# Patient Record
Sex: Female | Born: 1935 | Race: Black or African American | Hispanic: No | Marital: Single | Smoking: Never smoker
Health system: Southern US, Community
[De-identification: ages and names within clinical notes are randomized; demographics above are authoritative.]

## PROBLEM LIST (undated history)

## (undated) DIAGNOSIS — R32 Unspecified urinary incontinence: Secondary | ICD-10-CM

## (undated) DIAGNOSIS — G43909 Migraine, unspecified, not intractable, without status migrainosus: Secondary | ICD-10-CM

## (undated) DIAGNOSIS — M79609 Pain in unspecified limb: Secondary | ICD-10-CM

## (undated) DIAGNOSIS — B3781 Candidal esophagitis: Secondary | ICD-10-CM

## (undated) DIAGNOSIS — T884XXA Failed or difficult intubation, initial encounter: Secondary | ICD-10-CM

## (undated) DIAGNOSIS — K5900 Constipation, unspecified: Secondary | ICD-10-CM

## (undated) DIAGNOSIS — E039 Hypothyroidism, unspecified: Secondary | ICD-10-CM

## (undated) DIAGNOSIS — F32A Depression, unspecified: Secondary | ICD-10-CM

## (undated) DIAGNOSIS — N183 Chronic kidney disease, stage 3 unspecified: Secondary | ICD-10-CM

## (undated) DIAGNOSIS — E559 Vitamin D deficiency, unspecified: Secondary | ICD-10-CM

## (undated) DIAGNOSIS — I110 Hypertensive heart disease with heart failure: Secondary | ICD-10-CM

## (undated) DIAGNOSIS — R0600 Dyspnea, unspecified: Secondary | ICD-10-CM

## (undated) DIAGNOSIS — I10 Essential (primary) hypertension: Secondary | ICD-10-CM

## (undated) DIAGNOSIS — H25019 Cortical age-related cataract, unspecified eye: Secondary | ICD-10-CM

## (undated) DIAGNOSIS — L84 Corns and callosities: Secondary | ICD-10-CM

## (undated) DIAGNOSIS — L97509 Non-pressure chronic ulcer of other part of unspecified foot with unspecified severity: Secondary | ICD-10-CM

## (undated) DIAGNOSIS — Z9181 History of falling: Secondary | ICD-10-CM

## (undated) DIAGNOSIS — E079 Disorder of thyroid, unspecified: Secondary | ICD-10-CM

## (undated) DIAGNOSIS — G8190 Hemiplegia, unspecified affecting unspecified side: Secondary | ICD-10-CM

## (undated) DIAGNOSIS — I639 Cerebral infarction, unspecified: Secondary | ICD-10-CM

## (undated) DIAGNOSIS — D518 Other vitamin B12 deficiency anemias: Secondary | ICD-10-CM

## (undated) DIAGNOSIS — I739 Peripheral vascular disease, unspecified: Secondary | ICD-10-CM

## (undated) DIAGNOSIS — E785 Hyperlipidemia, unspecified: Secondary | ICD-10-CM

## (undated) DIAGNOSIS — R739 Hyperglycemia, unspecified: Secondary | ICD-10-CM

## (undated) DIAGNOSIS — R609 Edema, unspecified: Secondary | ICD-10-CM

## (undated) DIAGNOSIS — J439 Emphysema, unspecified: Secondary | ICD-10-CM

## (undated) DIAGNOSIS — I509 Heart failure, unspecified: Secondary | ICD-10-CM

## (undated) DIAGNOSIS — I7091 Generalized atherosclerosis: Secondary | ICD-10-CM

## (undated) DIAGNOSIS — M109 Gout, unspecified: Secondary | ICD-10-CM

## (undated) DIAGNOSIS — R7309 Other abnormal glucose: Secondary | ICD-10-CM

## (undated) DIAGNOSIS — G894 Chronic pain syndrome: Secondary | ICD-10-CM

## (undated) DIAGNOSIS — R1310 Dysphagia, unspecified: Secondary | ICD-10-CM

## (undated) DIAGNOSIS — F419 Anxiety disorder, unspecified: Secondary | ICD-10-CM

## (undated) DIAGNOSIS — E119 Type 2 diabetes mellitus without complications: Secondary | ICD-10-CM

## (undated) DIAGNOSIS — R5381 Other malaise: Secondary | ICD-10-CM

## (undated) DIAGNOSIS — F329 Major depressive disorder, single episode, unspecified: Secondary | ICD-10-CM

## (undated) DIAGNOSIS — K219 Gastro-esophageal reflux disease without esophagitis: Secondary | ICD-10-CM

## (undated) DIAGNOSIS — B351 Tinea unguium: Secondary | ICD-10-CM

## (undated) DIAGNOSIS — K8021 Calculus of gallbladder without cholecystitis with obstruction: Secondary | ICD-10-CM

## (undated) DIAGNOSIS — C801 Malignant (primary) neoplasm, unspecified: Secondary | ICD-10-CM

## (undated) DIAGNOSIS — E876 Hypokalemia: Secondary | ICD-10-CM

## (undated) DIAGNOSIS — M1990 Unspecified osteoarthritis, unspecified site: Secondary | ICD-10-CM

## (undated) DIAGNOSIS — G609 Hereditary and idiopathic neuropathy, unspecified: Secondary | ICD-10-CM

## (undated) DIAGNOSIS — H409 Unspecified glaucoma: Secondary | ICD-10-CM

## (undated) DIAGNOSIS — G8929 Other chronic pain: Secondary | ICD-10-CM

## (undated) DIAGNOSIS — J45909 Unspecified asthma, uncomplicated: Secondary | ICD-10-CM

## (undated) DIAGNOSIS — E162 Hypoglycemia, unspecified: Secondary | ICD-10-CM

## (undated) HISTORY — DX: Calculus of gallbladder without cholecystitis with obstruction: K80.21

## (undated) HISTORY — DX: Major depressive disorder, single episode, unspecified: F32.9

## (undated) HISTORY — DX: Chronic kidney disease, stage 3 unspecified: N18.30

## (undated) HISTORY — DX: Chronic kidney disease, stage 3 (moderate): N18.3

## (undated) HISTORY — DX: Essential (primary) hypertension: I10

## (undated) HISTORY — PX: ABDOMINAL HYSTERECTOMY: SHX81

## (undated) HISTORY — DX: Heart failure, unspecified: I50.9

## (undated) HISTORY — DX: Gout, unspecified: M10.9

## (undated) HISTORY — DX: Hypertensive heart disease with heart failure: I50.9

## (undated) HISTORY — PX: OTHER SURGICAL HISTORY: SHX169

## (undated) HISTORY — PX: EYE SURGERY: SHX253

## (undated) HISTORY — DX: Hypoglycemia, unspecified: E16.2

## (undated) HISTORY — DX: Constipation, unspecified: K59.00

## (undated) HISTORY — DX: Corns and callosities: L84

## (undated) HISTORY — DX: Candidal esophagitis: B37.81

## (undated) HISTORY — DX: Depression, unspecified: F32.A

## (undated) HISTORY — DX: Hypertensive heart disease with heart failure: I11.0

## (undated) HISTORY — PX: CATARACT EXTRACTION: SUR2

## (undated) HISTORY — DX: Hypokalemia: E87.6

## (undated) HISTORY — DX: Vitamin D deficiency, unspecified: E55.9

## (undated) HISTORY — PX: COLONOSCOPY: SHX174

## (undated) HISTORY — DX: Chronic pain syndrome: G89.4

## (undated) HISTORY — DX: History of falling: Z91.81

## (undated) HISTORY — DX: Unspecified asthma, uncomplicated: J45.909

## (undated) HISTORY — PX: BACK SURGERY: SHX140

## (undated) HISTORY — DX: Gastro-esophageal reflux disease without esophagitis: K21.9

## (undated) HISTORY — DX: Migraine, unspecified, not intractable, without status migrainosus: G43.909

## (undated) HISTORY — DX: Other vitamin B12 deficiency anemias: D51.8

---

## 2004-07-19 ENCOUNTER — Ambulatory Visit: Payer: Self-pay | Admitting: Internal Medicine

## 2005-08-25 ENCOUNTER — Ambulatory Visit: Payer: Self-pay | Admitting: Internal Medicine

## 2006-03-25 ENCOUNTER — Ambulatory Visit: Payer: Self-pay | Admitting: Rheumatology

## 2006-04-09 ENCOUNTER — Ambulatory Visit: Payer: Self-pay | Admitting: Rheumatology

## 2006-05-04 ENCOUNTER — Encounter: Payer: Self-pay | Admitting: Rheumatology

## 2006-05-18 ENCOUNTER — Encounter: Payer: Self-pay | Admitting: Rheumatology

## 2006-09-07 ENCOUNTER — Ambulatory Visit: Payer: Self-pay | Admitting: Internal Medicine

## 2007-06-27 ENCOUNTER — Other Ambulatory Visit: Payer: Self-pay

## 2007-06-27 ENCOUNTER — Emergency Department: Payer: Self-pay | Admitting: Emergency Medicine

## 2007-07-19 ENCOUNTER — Ambulatory Visit: Payer: Self-pay | Admitting: Oncology

## 2007-07-28 ENCOUNTER — Ambulatory Visit: Payer: Self-pay | Admitting: Internal Medicine

## 2007-08-04 ENCOUNTER — Ambulatory Visit: Payer: Self-pay | Admitting: Internal Medicine

## 2007-08-06 ENCOUNTER — Ambulatory Visit: Payer: Self-pay | Admitting: Oncology

## 2007-08-19 ENCOUNTER — Ambulatory Visit: Payer: Self-pay | Admitting: Oncology

## 2007-08-31 ENCOUNTER — Ambulatory Visit: Payer: Self-pay | Admitting: Urology

## 2007-08-31 ENCOUNTER — Other Ambulatory Visit: Payer: Self-pay

## 2007-09-14 ENCOUNTER — Ambulatory Visit: Payer: Self-pay | Admitting: Urology

## 2007-09-19 ENCOUNTER — Ambulatory Visit: Payer: Self-pay | Admitting: Oncology

## 2007-10-11 ENCOUNTER — Ambulatory Visit: Payer: Self-pay | Admitting: Internal Medicine

## 2007-10-17 ENCOUNTER — Ambulatory Visit: Payer: Self-pay | Admitting: Oncology

## 2007-10-29 ENCOUNTER — Ambulatory Visit: Payer: Self-pay | Admitting: Urology

## 2007-11-12 ENCOUNTER — Ambulatory Visit: Payer: Self-pay | Admitting: Vascular Surgery

## 2007-12-17 ENCOUNTER — Ambulatory Visit: Payer: Self-pay | Admitting: Oncology

## 2008-01-17 ENCOUNTER — Ambulatory Visit: Payer: Self-pay | Admitting: Oncology

## 2008-01-25 ENCOUNTER — Ambulatory Visit: Payer: Self-pay | Admitting: Oncology

## 2008-02-02 ENCOUNTER — Ambulatory Visit: Payer: Self-pay | Admitting: Urology

## 2008-02-16 ENCOUNTER — Ambulatory Visit: Payer: Self-pay | Admitting: Oncology

## 2008-04-18 ENCOUNTER — Ambulatory Visit: Payer: Self-pay | Admitting: Oncology

## 2008-05-12 ENCOUNTER — Ambulatory Visit: Payer: Self-pay | Admitting: Urology

## 2008-10-16 ENCOUNTER — Ambulatory Visit: Payer: Self-pay | Admitting: Urology

## 2009-03-15 ENCOUNTER — Ambulatory Visit: Payer: Self-pay | Admitting: Vascular Surgery

## 2009-04-29 ENCOUNTER — Emergency Department: Payer: Self-pay | Admitting: Internal Medicine

## 2009-05-07 ENCOUNTER — Ambulatory Visit: Payer: Self-pay | Admitting: Urology

## 2009-07-04 ENCOUNTER — Ambulatory Visit: Payer: Self-pay | Admitting: Ophthalmology

## 2009-08-23 ENCOUNTER — Ambulatory Visit: Payer: Self-pay | Admitting: Vascular Surgery

## 2009-10-25 ENCOUNTER — Ambulatory Visit: Payer: Self-pay | Admitting: Rheumatology

## 2009-10-25 ENCOUNTER — Ambulatory Visit: Payer: Self-pay | Admitting: Internal Medicine

## 2009-11-14 ENCOUNTER — Ambulatory Visit: Payer: Self-pay | Admitting: Gastroenterology

## 2009-11-21 ENCOUNTER — Ambulatory Visit: Payer: Self-pay | Admitting: Urology

## 2009-11-26 ENCOUNTER — Ambulatory Visit: Payer: Self-pay | Admitting: Gastroenterology

## 2009-12-10 ENCOUNTER — Ambulatory Visit: Payer: Self-pay | Admitting: Family Medicine

## 2009-12-12 ENCOUNTER — Ambulatory Visit: Payer: Self-pay | Admitting: Internal Medicine

## 2009-12-19 ENCOUNTER — Ambulatory Visit: Payer: Self-pay | Admitting: Ophthalmology

## 2010-01-09 ENCOUNTER — Ambulatory Visit: Payer: Self-pay | Admitting: Ophthalmology

## 2010-03-07 ENCOUNTER — Ambulatory Visit: Payer: Self-pay | Admitting: Gastroenterology

## 2010-03-11 ENCOUNTER — Ambulatory Visit: Payer: Self-pay | Admitting: Pain Medicine

## 2010-03-12 ENCOUNTER — Ambulatory Visit: Payer: Self-pay | Admitting: Pain Medicine

## 2010-03-25 ENCOUNTER — Ambulatory Visit: Payer: Self-pay | Admitting: Pain Medicine

## 2010-04-09 ENCOUNTER — Ambulatory Visit: Payer: Self-pay | Admitting: Pain Medicine

## 2010-04-23 ENCOUNTER — Ambulatory Visit: Payer: Self-pay | Admitting: Pain Medicine

## 2010-05-06 ENCOUNTER — Ambulatory Visit: Payer: Self-pay | Admitting: Pain Medicine

## 2010-06-05 ENCOUNTER — Ambulatory Visit: Payer: Self-pay | Admitting: Urology

## 2010-09-30 ENCOUNTER — Ambulatory Visit: Payer: Self-pay | Admitting: Unknown Physician Specialty

## 2010-10-14 ENCOUNTER — Ambulatory Visit: Payer: Self-pay | Admitting: Unknown Physician Specialty

## 2010-10-17 ENCOUNTER — Inpatient Hospital Stay: Payer: Self-pay | Admitting: Unknown Physician Specialty

## 2010-11-11 ENCOUNTER — Ambulatory Visit: Payer: Self-pay | Admitting: Unknown Physician Specialty

## 2011-01-30 ENCOUNTER — Emergency Department: Payer: Self-pay | Admitting: Emergency Medicine

## 2011-03-28 ENCOUNTER — Other Ambulatory Visit: Payer: Self-pay

## 2011-04-08 ENCOUNTER — Ambulatory Visit: Payer: Self-pay | Admitting: Gastroenterology

## 2011-04-14 ENCOUNTER — Observation Stay: Payer: Self-pay | Admitting: Internal Medicine

## 2011-05-21 ENCOUNTER — Ambulatory Visit: Payer: Self-pay | Admitting: Gastroenterology

## 2011-08-09 ENCOUNTER — Observation Stay: Payer: Self-pay | Admitting: Specialist

## 2011-08-17 ENCOUNTER — Inpatient Hospital Stay: Payer: Self-pay | Admitting: *Deleted

## 2011-08-20 LAB — BASIC METABOLIC PANEL
Anion Gap: 14 (ref 7–16)
BUN: 3 mg/dL — ABNORMAL LOW (ref 7–18)
Chloride: 110 mmol/L — ABNORMAL HIGH (ref 98–107)
Creatinine: 0.99 mg/dL (ref 0.60–1.30)
EGFR (Non-African Amer.): 57 — ABNORMAL LOW
Glucose: 97 mg/dL (ref 65–99)
Osmolality: 280 (ref 275–301)
Sodium: 142 mmol/L (ref 136–145)

## 2011-08-20 LAB — CBC WITH DIFFERENTIAL/PLATELET
Basophil %: 0.3 %
Eosinophil #: 0.1 10*3/uL (ref 0.0–0.7)
Eosinophil %: 1.9 %
HGB: 12.3 g/dL (ref 12.0–16.0)
Lymphocyte #: 1.9 10*3/uL (ref 1.0–3.6)
MCH: 32.4 pg (ref 26.0–34.0)
MCHC: 32.9 g/dL (ref 32.0–36.0)
MCV: 99 fL (ref 80–100)
Monocyte #: 0.4 10*3/uL (ref 0.0–0.7)
Platelet: 180 10*3/uL (ref 150–440)
RBC: 3.79 10*6/uL — ABNORMAL LOW (ref 3.80–5.20)

## 2011-08-21 LAB — BASIC METABOLIC PANEL
Anion Gap: 10 (ref 7–16)
BUN: 1 mg/dL — ABNORMAL LOW (ref 7–18)
Chloride: 113 mmol/L — ABNORMAL HIGH (ref 98–107)
Creatinine: 1.04 mg/dL (ref 0.60–1.30)
Glucose: 90 mg/dL (ref 65–99)
Osmolality: 282 (ref 275–301)
Potassium: 3.7 mmol/L (ref 3.5–5.1)

## 2011-08-21 LAB — CBC WITH DIFFERENTIAL/PLATELET
Basophil #: 0 10*3/uL (ref 0.0–0.1)
Basophil %: 0.3 %
Eosinophil #: 0.1 10*3/uL (ref 0.0–0.7)
Eosinophil %: 1.5 %
HCT: 32.8 % — ABNORMAL LOW (ref 35.0–47.0)
HGB: 10.9 g/dL — ABNORMAL LOW (ref 12.0–16.0)
Lymphocyte %: 37.4 %
MCHC: 33.1 g/dL (ref 32.0–36.0)
Monocyte #: 0.5 10*3/uL (ref 0.0–0.7)
Monocyte %: 8.8 %
Neutrophil #: 2.8 10*3/uL (ref 1.4–6.5)
Neutrophil %: 52 %
Platelet: 151 10*3/uL (ref 150–440)
RBC: 3.37 10*6/uL — ABNORMAL LOW (ref 3.80–5.20)
RDW: 15.1 % — ABNORMAL HIGH (ref 11.5–14.5)
WBC: 5.3 10*3/uL (ref 3.6–11.0)

## 2011-08-21 LAB — AFP TUMOR MARKER: AFP-Tumor Marker: 5.4 ng/mL (ref 0.0–8.3)

## 2011-08-22 LAB — CBC WITH DIFFERENTIAL/PLATELET
Eosinophil %: 0.8 %
HGB: 13.1 g/dL (ref 12.0–16.0)
MCV: 100 fL (ref 80–100)
Monocyte %: 10.3 %
Neutrophil %: 65.6 %
Platelet: 160 10*3/uL (ref 150–440)
RBC: 4.02 10*6/uL (ref 3.80–5.20)
WBC: 7.8 10*3/uL (ref 3.6–11.0)

## 2011-08-23 LAB — BASIC METABOLIC PANEL
Anion Gap: 8 (ref 7–16)
BUN: 2 mg/dL — ABNORMAL LOW (ref 7–18)
Calcium, Total: 9 mg/dL (ref 8.5–10.1)
Chloride: 110 mmol/L — ABNORMAL HIGH (ref 98–107)
Co2: 21 mmol/L (ref 21–32)
Creatinine: 0.79 mg/dL (ref 0.60–1.30)
Potassium: 5.4 mmol/L — ABNORMAL HIGH (ref 3.5–5.1)

## 2011-08-23 LAB — POTASSIUM: Potassium: 4.5 mmol/L (ref 3.5–5.1)

## 2011-08-23 LAB — CBC WITH DIFFERENTIAL/PLATELET
Basophil %: 1 %
Eosinophil %: 1.7 %
HCT: 36.2 % (ref 35.0–47.0)
HGB: 12.1 g/dL (ref 12.0–16.0)
Lymphocyte #: 2.1 10*3/uL (ref 1.0–3.6)
Lymphocyte %: 25.4 %
MCH: 32.5 pg (ref 26.0–34.0)
MCV: 98 fL (ref 80–100)
Monocyte #: 0.6 10*3/uL (ref 0.0–0.7)
Monocyte %: 7.8 %
Neutrophil #: 5.2 10*3/uL (ref 1.4–6.5)
Platelet: 202 10*3/uL (ref 150–440)
RBC: 3.71 10*6/uL — ABNORMAL LOW (ref 3.80–5.20)
WBC: 8.2 10*3/uL (ref 3.6–11.0)

## 2011-08-23 LAB — GLUCOSE, RANDOM: Glucose: 81 mg/dL (ref 65–99)

## 2011-08-24 LAB — BASIC METABOLIC PANEL
Anion Gap: 10 (ref 7–16)
BUN: 5 mg/dL — ABNORMAL LOW (ref 7–18)
Chloride: 106 mmol/L (ref 98–107)
Co2: 24 mmol/L (ref 21–32)
Osmolality: 277 (ref 275–301)

## 2011-08-25 LAB — BASIC METABOLIC PANEL
Calcium, Total: 9.1 mg/dL (ref 8.5–10.1)
Chloride: 106 mmol/L (ref 98–107)
EGFR (Non-African Amer.): 56 — ABNORMAL LOW
Glucose: 106 mg/dL — ABNORMAL HIGH (ref 65–99)
Osmolality: 274 (ref 275–301)
Potassium: 3.8 mmol/L (ref 3.5–5.1)
Sodium: 138 mmol/L (ref 136–145)

## 2011-08-25 LAB — GLUCOSE, RANDOM: Glucose: 84 mg/dL (ref 65–99)

## 2011-08-25 LAB — PATHOLOGY REPORT

## 2011-08-26 LAB — BASIC METABOLIC PANEL
Anion Gap: 12 (ref 7–16)
BUN: 9 mg/dL (ref 7–18)
Co2: 21 mmol/L (ref 21–32)
Creatinine: 1.12 mg/dL (ref 0.60–1.30)
EGFR (African American): >60
EGFR (Non-African Amer.): 50 — ABNORMAL LOW
Osmolality: 278 (ref 275–301)
Sodium: 140 mmol/L (ref 136–145)

## 2011-08-26 LAB — MAGNESIUM: Magnesium: 1.8 mg/dL

## 2011-08-27 LAB — BASIC METABOLIC PANEL
Anion Gap: 12 (ref 7–16)
BUN: 13 mg/dL (ref 7–18)
Chloride: 106 mmol/L (ref 98–107)
Co2: 21 mmol/L (ref 21–32)
Creatinine: 1.14 mg/dL (ref 0.60–1.30)
EGFR (African American): 59 — ABNORMAL LOW
EGFR (Non-African Amer.): 49 — ABNORMAL LOW
Osmolality: 277 (ref 275–301)
Potassium: 4.4 mmol/L (ref 3.5–5.1)
Sodium: 139 mmol/L (ref 136–145)

## 2011-08-27 LAB — GLUCOSE, RANDOM: Glucose: 89 mg/dL (ref 65–99)

## 2012-11-22 ENCOUNTER — Ambulatory Visit: Payer: Self-pay | Admitting: Gastroenterology

## 2012-11-22 LAB — KOH PREP

## 2013-01-10 LAB — URINALYSIS, COMPLETE
Blood: NEGATIVE
Hyaline Cast: 3
Nitrite: NEGATIVE
Ph: 9 (ref 4.5–8.0)
Protein: NEGATIVE
Squamous Epithelial: 3

## 2013-01-10 LAB — COMPREHENSIVE METABOLIC PANEL
Albumin: 2.9 g/dL — ABNORMAL LOW (ref 3.4–5.0)
Alkaline Phosphatase: 231 U/L — ABNORMAL HIGH (ref 50–136)
Anion Gap: 12 (ref 7–16)
Bilirubin,Total: 0.4 mg/dL (ref 0.2–1.0)
Creatinine: 0.87 mg/dL (ref 0.60–1.30)
Glucose: 107 mg/dL — ABNORMAL HIGH (ref 65–99)
Osmolality: 276 (ref 275–301)
Potassium: 3.1 mmol/L — ABNORMAL LOW (ref 3.5–5.1)
SGOT(AST): 21 U/L (ref 15–37)
SGPT (ALT): 12 U/L (ref 12–78)

## 2013-01-10 LAB — CBC
HGB: 12.7 g/dL (ref 12.0–16.0)
MCH: 28.4 pg (ref 26.0–34.0)
MCV: 87 fL (ref 80–100)
Platelet: 374 10*3/uL (ref 150–440)
RBC: 4.46 10*6/uL (ref 3.80–5.20)
RDW: 16.9 % — ABNORMAL HIGH (ref 11.5–14.5)

## 2013-01-11 LAB — BASIC METABOLIC PANEL
BUN: 6 mg/dL — ABNORMAL LOW (ref 7–18)
Calcium, Total: 9.1 mg/dL (ref 8.5–10.1)
Chloride: 100 mmol/L (ref 98–107)
Co2: 22 mmol/L (ref 21–32)
Creatinine: 1.11 mg/dL (ref 0.60–1.30)
EGFR (Non-African Amer.): 46 — ABNORMAL LOW
Glucose: 99 mg/dL (ref 65–99)
Osmolality: 264 (ref 275–301)
Potassium: 3.3 mmol/L — ABNORMAL LOW (ref 3.5–5.1)
Sodium: 133 mmol/L — ABNORMAL LOW (ref 136–145)

## 2013-01-11 LAB — CBC WITH DIFFERENTIAL/PLATELET
Basophil #: 0 10*3/uL (ref 0.0–0.1)
Basophil %: 0.3 %
HCT: 39.4 % (ref 35.0–47.0)
Lymphocyte #: 1.4 10*3/uL (ref 1.0–3.6)
MCH: 29 pg (ref 26.0–34.0)
MCHC: 33.5 g/dL (ref 32.0–36.0)
MCV: 87 fL (ref 80–100)
Monocyte %: 5.5 %
Neutrophil #: 11.7 10*3/uL — ABNORMAL HIGH (ref 1.4–6.5)
Neutrophil %: 84.3 %
Platelet: 376 10*3/uL (ref 150–440)
RBC: 4.55 10*6/uL (ref 3.80–5.20)
RDW: 17 % — ABNORMAL HIGH (ref 11.5–14.5)

## 2013-01-11 LAB — LIPASE, BLOOD: Lipase: 129 U/L (ref 73–393)

## 2013-01-11 LAB — PRO B NATRIURETIC PEPTIDE: B-Type Natriuretic Peptide: 4252 pg/mL — ABNORMAL HIGH (ref 0–450)

## 2013-01-12 ENCOUNTER — Inpatient Hospital Stay: Payer: Self-pay | Admitting: Student

## 2013-01-12 LAB — BASIC METABOLIC PANEL
BUN: 8 mg/dL (ref 7–18)
Calcium, Total: 9 mg/dL (ref 8.5–10.1)
Co2: 22 mmol/L (ref 21–32)
EGFR (Non-African Amer.): 51 — ABNORMAL LOW
Glucose: 78 mg/dL (ref 65–99)
Osmolality: 267 (ref 275–301)
Potassium: 3.4 mmol/L — ABNORMAL LOW (ref 3.5–5.1)
Sodium: 135 mmol/L — ABNORMAL LOW (ref 136–145)

## 2013-01-12 LAB — CBC WITH DIFFERENTIAL/PLATELET
Basophil #: 0 10*3/uL (ref 0.0–0.1)
Basophil %: 0.1 %
Eosinophil %: 0.1 %
Lymphocyte #: 2 10*3/uL (ref 1.0–3.6)
Lymphocyte %: 12.8 %
MCH: 28.9 pg (ref 26.0–34.0)
MCHC: 33.4 g/dL (ref 32.0–36.0)
MCV: 87 fL (ref 80–100)
Monocyte #: 1.3 x10 3/mm — ABNORMAL HIGH (ref 0.2–0.9)
Neutrophil #: 12.1 10*3/uL — ABNORMAL HIGH (ref 1.4–6.5)
Neutrophil %: 78.6 %
Platelet: 406 10*3/uL (ref 150–440)
RBC: 5.11 10*6/uL (ref 3.80–5.20)
RDW: 16.9 % — ABNORMAL HIGH (ref 11.5–14.5)

## 2013-01-12 LAB — AFP TUMOR MARKER: AFP-Tumor Marker: 4.2 ng/mL (ref 0.0–8.3)

## 2013-01-12 LAB — MAGNESIUM: Magnesium: 1.8 mg/dL

## 2013-01-14 LAB — CBC WITH DIFFERENTIAL/PLATELET
Basophil #: 0.1 10*3/uL (ref 0.0–0.1)
Basophil %: 0.9 %
Eosinophil #: 0.2 10*3/uL (ref 0.0–0.7)
Eosinophil %: 1 %
HCT: 40.3 % (ref 35.0–47.0)
Lymphocyte #: 4.2 10*3/uL — ABNORMAL HIGH (ref 1.0–3.6)
Lymphocyte %: 27.1 %
MCH: 29.1 pg (ref 26.0–34.0)
MCHC: 33.4 g/dL (ref 32.0–36.0)
MCV: 87 fL (ref 80–100)
Neutrophil #: 9.9 10*3/uL — ABNORMAL HIGH (ref 1.4–6.5)
Neutrophil %: 64.3 %
Platelet: 335 10*3/uL (ref 150–440)
RDW: 17 % — ABNORMAL HIGH (ref 11.5–14.5)

## 2013-01-14 LAB — URINE CULTURE

## 2013-01-16 ENCOUNTER — Ambulatory Visit: Payer: Self-pay | Admitting: Internal Medicine

## 2013-01-16 LAB — STOOL CULTURE

## 2013-01-18 LAB — CULTURE, BLOOD (SINGLE)

## 2013-01-20 LAB — COMPREHENSIVE METABOLIC PANEL
Albumin: 2.5 g/dL — ABNORMAL LOW (ref 3.4–5.0)
Anion Gap: 8 (ref 7–16)
Bilirubin,Total: 0.3 mg/dL (ref 0.2–1.0)
Calcium, Total: 8.5 mg/dL (ref 8.5–10.1)
EGFR (African American): 41 — ABNORMAL LOW
EGFR (Non-African Amer.): 35 — ABNORMAL LOW
Glucose: 315 mg/dL — ABNORMAL HIGH (ref 65–99)
Osmolality: 277 (ref 275–301)
Potassium: 4.3 mmol/L (ref 3.5–5.1)
SGOT(AST): 18 U/L (ref 15–37)
SGPT (ALT): 10 U/L — ABNORMAL LOW (ref 12–78)
Sodium: 132 mmol/L — ABNORMAL LOW (ref 136–145)
Total Protein: 6.3 g/dL — ABNORMAL LOW (ref 6.4–8.2)

## 2013-01-20 LAB — BASIC METABOLIC PANEL
BUN: 13 mg/dL (ref 7–18)
Calcium, Total: 8.8 mg/dL (ref 8.5–10.1)
Co2: 20 mmol/L — ABNORMAL LOW (ref 21–32)
Creatinine: 1.5 mg/dL — ABNORMAL HIGH (ref 0.60–1.30)
EGFR (African American): 37 — ABNORMAL LOW
Glucose: 235 mg/dL — ABNORMAL HIGH (ref 65–99)
Osmolality: 276 (ref 275–301)

## 2013-01-20 LAB — URINALYSIS, COMPLETE
Bacteria: NONE SEEN
Bilirubin,UR: NEGATIVE
Blood: NEGATIVE
Glucose,UR: >=500 mg/dL (ref 0–75)
Hyaline Cast: 3
Leukocyte Esterase: NEGATIVE
Nitrite: NEGATIVE
RBC,UR: 3 /HPF (ref 0–5)
Squamous Epithelial: 2

## 2013-01-20 LAB — CBC
MCH: 28.6 pg (ref 26.0–34.0)
RBC: 3.92 10*6/uL (ref 3.80–5.20)
WBC: 15.1 10*3/uL — ABNORMAL HIGH (ref 3.6–11.0)

## 2013-01-20 LAB — HEMOGLOBIN A1C: Hemoglobin A1C: 5.4 % (ref 4.2–6.3)

## 2013-01-21 ENCOUNTER — Inpatient Hospital Stay: Payer: Self-pay | Admitting: Student

## 2013-01-21 LAB — COMPREHENSIVE METABOLIC PANEL
Alkaline Phosphatase: 174 U/L — ABNORMAL HIGH (ref 50–136)
Bilirubin,Total: 0.3 mg/dL (ref 0.2–1.0)
Chloride: 106 mmol/L (ref 98–107)
Co2: 24 mmol/L (ref 21–32)
Creatinine: 1.19 mg/dL (ref 0.60–1.30)
EGFR (African American): 49 — ABNORMAL LOW
EGFR (Non-African Amer.): 42 — ABNORMAL LOW
Glucose: 73 mg/dL (ref 65–99)
Osmolality: 268 (ref 275–301)
SGOT(AST): 17 U/L (ref 15–37)
SGPT (ALT): 9 U/L — ABNORMAL LOW (ref 12–78)
Sodium: 135 mmol/L — ABNORMAL LOW (ref 136–145)
Total Protein: 6.8 g/dL (ref 6.4–8.2)

## 2013-01-21 LAB — CBC WITH DIFFERENTIAL/PLATELET
Basophil #: 0.1 10*3/uL (ref 0.0–0.1)
Basophil %: 0.6 %
Eosinophil %: 2.6 %
Lymphocyte #: 2.7 10*3/uL (ref 1.0–3.6)
Lymphocyte %: 26.4 %
MCH: 28.7 pg (ref 26.0–34.0)
MCHC: 32.5 g/dL (ref 32.0–36.0)
Monocyte #: 1 x10 3/mm — ABNORMAL HIGH (ref 0.2–0.9)
Neutrophil #: 6.2 10*3/uL (ref 1.4–6.5)
Neutrophil %: 60.9 %
Platelet: 272 10*3/uL (ref 150–440)
RBC: 4 10*6/uL (ref 3.80–5.20)
RDW: 17.2 % — ABNORMAL HIGH (ref 11.5–14.5)

## 2013-01-21 LAB — TROPONIN I
Troponin-I: <0.02 ng/mL
Troponin-I: <0.02 ng/mL

## 2013-01-21 LAB — PRO B NATRIURETIC PEPTIDE: B-Type Natriuretic Peptide: 492 pg/mL — ABNORMAL HIGH (ref 0–450)

## 2013-01-23 LAB — URINE CULTURE

## 2013-01-26 LAB — CULTURE, BLOOD (SINGLE)

## 2013-02-15 ENCOUNTER — Ambulatory Visit: Payer: Self-pay | Admitting: Internal Medicine

## 2013-02-27 ENCOUNTER — Emergency Department: Payer: Self-pay | Admitting: Internal Medicine

## 2013-02-27 LAB — CBC
MCH: 29.4 pg (ref 26.0–34.0)
MCHC: 32.2 g/dL (ref 32.0–36.0)
Platelet: 359 10*3/uL (ref 150–440)
RBC: 4.11 10*6/uL (ref 3.80–5.20)

## 2013-02-27 LAB — COMPREHENSIVE METABOLIC PANEL
Albumin: 2.6 g/dL — ABNORMAL LOW (ref 3.4–5.0)
Alkaline Phosphatase: 178 U/L — ABNORMAL HIGH (ref 50–136)
BUN: 13 mg/dL (ref 7–18)
Bilirubin,Total: 0.4 mg/dL (ref 0.2–1.0)
Calcium, Total: 8.9 mg/dL (ref 8.5–10.1)
Co2: 23 mmol/L (ref 21–32)
Creatinine: 1.17 mg/dL (ref 0.60–1.30)
Glucose: 112 mg/dL — ABNORMAL HIGH (ref 65–99)
Osmolality: 271 (ref 275–301)
Potassium: 4 mmol/L (ref 3.5–5.1)
Total Protein: 7.3 g/dL (ref 6.4–8.2)

## 2013-02-27 LAB — URINALYSIS, COMPLETE
Bilirubin,UR: NEGATIVE
Blood: NEGATIVE
Leukocyte Esterase: NEGATIVE
Nitrite: NEGATIVE
Protein: NEGATIVE
RBC,UR: <1 /HPF (ref 0–5)

## 2013-07-01 ENCOUNTER — Ambulatory Visit: Payer: Self-pay | Admitting: Gastroenterology

## 2013-07-15 ENCOUNTER — Ambulatory Visit: Payer: Self-pay | Admitting: Gastroenterology

## 2013-08-03 ENCOUNTER — Encounter: Payer: Self-pay | Admitting: *Deleted

## 2013-08-22 ENCOUNTER — Ambulatory Visit: Payer: Self-pay | Admitting: Podiatry

## 2014-07-17 ENCOUNTER — Ambulatory Visit: Payer: Self-pay

## 2014-07-17 LAB — URINALYSIS, COMPLETE
BLOOD: NEGATIVE
Glucose,UR: NEGATIVE
Ketone: NEGATIVE
Nitrite: NEGATIVE
Ph: 5 (ref 5.0–8.0)
Protein: NEGATIVE
SPECIFIC GRAVITY: 1.015 (ref 1.000–1.030)

## 2014-07-19 LAB — URINE CULTURE

## 2014-07-25 ENCOUNTER — Emergency Department: Payer: Self-pay | Admitting: Emergency Medicine

## 2014-07-25 LAB — CBC WITH DIFFERENTIAL/PLATELET
Basophil #: 0.1 10*3/uL (ref 0.0–0.1)
Basophil %: 0.5 %
EOS ABS: 0.4 10*3/uL (ref 0.0–0.7)
Eosinophil %: 3.1 %
HCT: 42.9 % (ref 35.0–47.0)
HGB: 13.1 g/dL (ref 12.0–16.0)
LYMPHS ABS: 2.2 10*3/uL (ref 1.0–3.6)
Lymphocyte %: 17.7 %
MCH: 29.2 pg (ref 26.0–34.0)
MCHC: 30.6 g/dL — ABNORMAL LOW (ref 32.0–36.0)
MCV: 95 fL (ref 80–100)
MONOS PCT: 7.7 %
Monocyte #: 0.9 x10 3/mm (ref 0.2–0.9)
Neutrophil #: 8.7 10*3/uL — ABNORMAL HIGH (ref 1.4–6.5)
Neutrophil %: 71 %
PLATELETS: 287 10*3/uL (ref 150–440)
RBC: 4.49 10*6/uL (ref 3.80–5.20)
RDW: 16.8 % — AB (ref 11.5–14.5)
WBC: 12.3 10*3/uL — AB (ref 3.6–11.0)

## 2014-07-25 LAB — TROPONIN I: Troponin-I: <0.02 ng/mL

## 2014-07-25 LAB — URINALYSIS, COMPLETE
Bilirubin,UR: NEGATIVE
Blood: NEGATIVE
Glucose,UR: 50 mg/dL (ref 0–75)
Ketone: NEGATIVE
Nitrite: NEGATIVE
PH: 7 (ref 4.5–8.0)
PROTEIN: NEGATIVE
RBC,UR: 6 /HPF (ref 0–5)
Specific Gravity: 1.01 (ref 1.003–1.030)
WBC UR: 55 /HPF (ref 0–5)

## 2014-07-25 LAB — BASIC METABOLIC PANEL
ANION GAP: 8 (ref 7–16)
Anion Gap: 11 (ref 7–16)
BUN: 9 mg/dL (ref 7–18)
BUN: 8 mg/dL (ref 7–18)
Calcium, Total: 9.4 mg/dL (ref 8.5–10.1)
Calcium, Total: 8.7 mg/dL (ref 8.5–10.1)
Chloride: 108 mmol/L — ABNORMAL HIGH (ref 98–107)
Chloride: 103 mmol/L (ref 98–107)
Co2: 21 mmol/L (ref 21–32)
Co2: 24 mmol/L (ref 21–32)
Creatinine: 1.2 mg/dL (ref 0.60–1.30)
Creatinine: 1.24 mg/dL (ref 0.60–1.30)
EGFR (African American): 55 — ABNORMAL LOW
EGFR (African American): 53 — ABNORMAL LOW
GFR CALC NON AF AMER: 45 — AB
GFR CALC NON AF AMER: 44 — AB
Glucose: 74 mg/dL (ref 65–99)
Glucose: 156 mg/dL — ABNORMAL HIGH (ref 65–99)
Osmolality: 271 (ref 275–301)
Osmolality: 277 (ref 275–301)
Potassium: 4.7 mmol/L (ref 3.5–5.1)
Potassium: 3.9 mmol/L (ref 3.5–5.1)
Sodium: 137 mmol/L (ref 136–145)
Sodium: 138 mmol/L (ref 136–145)

## 2014-07-25 LAB — TSH: Thyroid Stimulating Horm: 1.92 u[IU]/mL

## 2014-07-27 LAB — URINE CULTURE

## 2014-08-20 ENCOUNTER — Emergency Department: Payer: Self-pay | Admitting: Emergency Medicine

## 2014-08-20 LAB — HEPATIC FUNCTION PANEL A (ARMC)
ALK PHOS: 157 U/L — AB
ALT: 15 U/L
Albumin: 2.7 g/dL — ABNORMAL LOW (ref 3.4–5.0)
BILIRUBIN TOTAL: 0.3 mg/dL (ref 0.2–1.0)
Bilirubin, Direct: <0.1 mg/dL (ref 0.0–0.2)
SGOT(AST): 26 U/L (ref 15–37)
Total Protein: 7.4 g/dL (ref 6.4–8.2)

## 2014-08-20 LAB — URINALYSIS, COMPLETE
BILIRUBIN, UR: NEGATIVE
Bacteria: NONE SEEN
Blood: NEGATIVE
Glucose,UR: NEGATIVE mg/dL (ref 0–75)
Leukocyte Esterase: NEGATIVE
Nitrite: NEGATIVE
PH: 5 (ref 4.5–8.0)
PROTEIN: NEGATIVE
SPECIFIC GRAVITY: 1.013 (ref 1.003–1.030)

## 2014-08-20 LAB — CBC WITH DIFFERENTIAL/PLATELET
BASOS ABS: 0.1 10*3/uL (ref 0.0–0.1)
Basophil %: 0.5 %
Eosinophil #: 0.1 10*3/uL (ref 0.0–0.7)
Eosinophil %: 0.6 %
HCT: 39.9 % (ref 35.0–47.0)
HGB: 12.6 g/dL (ref 12.0–16.0)
Lymphocyte #: 0.9 10*3/uL — ABNORMAL LOW (ref 1.0–3.6)
Lymphocyte %: 9.9 %
MCH: 29.8 pg (ref 26.0–34.0)
MCHC: 31.5 g/dL — ABNORMAL LOW (ref 32.0–36.0)
MCV: 94 fL (ref 80–100)
Monocyte #: 0.3 x10 3/mm (ref 0.2–0.9)
Monocyte %: 3 %
Neutrophil #: 8.2 10*3/uL — ABNORMAL HIGH (ref 1.4–6.5)
Neutrophil %: 86 %
PLATELETS: 314 10*3/uL (ref 150–440)
RBC: 4.23 10*6/uL (ref 3.80–5.20)
RDW: 16.4 % — ABNORMAL HIGH (ref 11.5–14.5)
WBC: 9.5 10*3/uL (ref 3.6–11.0)

## 2014-08-20 LAB — BASIC METABOLIC PANEL
Anion Gap: 8 (ref 7–16)
BUN: 10 mg/dL (ref 7–18)
CREATININE: 1.13 mg/dL (ref 0.60–1.30)
Calcium, Total: 8.8 mg/dL (ref 8.5–10.1)
Chloride: 108 mmol/L — ABNORMAL HIGH (ref 98–107)
Co2: 24 mmol/L (ref 21–32)
EGFR (Non-African Amer.): 49 — ABNORMAL LOW
GFR CALC AF AMER: 59 — AB
Glucose: 92 mg/dL (ref 65–99)
Osmolality: 278 (ref 275–301)
Potassium: 4.6 mmol/L (ref 3.5–5.1)
Sodium: 140 mmol/L (ref 136–145)

## 2014-08-20 LAB — PRO B NATRIURETIC PEPTIDE: B-Type Natriuretic Peptide: 672 pg/mL — ABNORMAL HIGH (ref 0–450)

## 2014-08-20 LAB — TROPONIN I: Troponin-I: <0.02 ng/mL

## 2014-09-12 ENCOUNTER — Encounter: Payer: Self-pay | Admitting: Surgery

## 2014-09-14 ENCOUNTER — Inpatient Hospital Stay: Payer: Self-pay | Admitting: Internal Medicine

## 2014-09-14 LAB — URINALYSIS, COMPLETE
Bacteria: NONE SEEN
Bilirubin,UR: NEGATIVE
Blood: NEGATIVE
Glucose,UR: NEGATIVE mg/dL (ref 0–75)
LEUKOCYTE ESTERASE: NEGATIVE
NITRITE: NEGATIVE
PH: 6 (ref 4.5–8.0)
Specific Gravity: 1.015 (ref 1.003–1.030)
WBC UR: 2 /HPF (ref 0–5)

## 2014-09-14 LAB — COMPREHENSIVE METABOLIC PANEL
ALBUMIN: 2.9 g/dL — AB (ref 3.4–5.0)
ALT: 9 U/L — AB (ref 14–63)
AST: 23 U/L (ref 15–37)
Alkaline Phosphatase: 145 U/L — ABNORMAL HIGH (ref 46–116)
Anion Gap: 11 (ref 7–16)
BUN: 12 mg/dL (ref 7–18)
Bilirubin,Total: 0.5 mg/dL (ref 0.2–1.0)
Calcium, Total: 9 mg/dL (ref 8.5–10.1)
Chloride: 108 mmol/L — ABNORMAL HIGH (ref 98–107)
Co2: 19 mmol/L — ABNORMAL LOW (ref 21–32)
Creatinine: 1.1 mg/dL (ref 0.60–1.30)
EGFR (African American): >60
EGFR (Non-African Amer.): 50 — ABNORMAL LOW
GLUCOSE: 128 mg/dL — AB (ref 65–99)
Osmolality: 277 (ref 275–301)
Potassium: 3.6 mmol/L (ref 3.5–5.1)
Sodium: 138 mmol/L (ref 136–145)
TOTAL PROTEIN: 8.1 g/dL (ref 6.4–8.2)

## 2014-09-14 LAB — CBC
HCT: 40.5 % (ref 35.0–47.0)
HGB: 13.1 g/dL (ref 12.0–16.0)
MCH: 29.2 pg (ref 26.0–34.0)
MCHC: 32.2 g/dL (ref 32.0–36.0)
MCV: 91 fL (ref 80–100)
Platelet: 332 10*3/uL (ref 150–440)
RBC: 4.48 10*6/uL (ref 3.80–5.20)
RDW: 16.2 % — ABNORMAL HIGH (ref 11.5–14.5)
WBC: 18.1 10*3/uL — AB (ref 3.6–11.0)

## 2014-09-14 LAB — GLUCOSE, RANDOM: Glucose: 106 mg/dL — ABNORMAL HIGH (ref 65–99)

## 2014-09-14 LAB — TROPONIN I
TROPONIN-I: 0.07 ng/mL — AB
Troponin-I: 0.08 ng/mL — ABNORMAL HIGH
Troponin-I: 0.05 ng/mL

## 2014-09-14 LAB — SEDIMENTATION RATE: Erythrocyte Sed Rate: 126 mm/hr — ABNORMAL HIGH (ref 0–30)

## 2014-09-15 LAB — BASIC METABOLIC PANEL
Anion Gap: 7 (ref 7–16)
BUN: 14 mg/dL (ref 7–18)
CALCIUM: 8.4 mg/dL — AB (ref 8.5–10.1)
CHLORIDE: 110 mmol/L — AB (ref 98–107)
Co2: 23 mmol/L (ref 21–32)
Creatinine: 1.19 mg/dL (ref 0.60–1.30)
GFR CALC AF AMER: 56 — AB
GFR CALC NON AF AMER: 46 — AB
Glucose: 86 mg/dL (ref 65–99)
OSMOLALITY: 279 (ref 275–301)
POTASSIUM: 3.7 mmol/L (ref 3.5–5.1)
SODIUM: 140 mmol/L (ref 136–145)

## 2014-09-15 LAB — CBC WITH DIFFERENTIAL/PLATELET
Basophil #: 0.1 10*3/uL (ref 0.0–0.1)
Basophil %: 0.6 %
EOS PCT: 1.8 %
Eosinophil #: 0.2 10*3/uL (ref 0.0–0.7)
HCT: 34.1 % — AB (ref 35.0–47.0)
HGB: 10.9 g/dL — ABNORMAL LOW (ref 12.0–16.0)
Lymphocyte #: 3 10*3/uL (ref 1.0–3.6)
Lymphocyte %: 21.6 %
MCH: 29.6 pg (ref 26.0–34.0)
MCHC: 32 g/dL (ref 32.0–36.0)
MCV: 92 fL (ref 80–100)
MONOS PCT: 7 %
Monocyte #: 1 x10 3/mm — ABNORMAL HIGH (ref 0.2–0.9)
Neutrophil #: 9.5 10*3/uL — ABNORMAL HIGH (ref 1.4–6.5)
Neutrophil %: 69 %
PLATELETS: 278 10*3/uL (ref 150–440)
RBC: 3.69 10*6/uL — AB (ref 3.80–5.20)
RDW: 16.2 % — ABNORMAL HIGH (ref 11.5–14.5)
WBC: 13.7 10*3/uL — ABNORMAL HIGH (ref 3.6–11.0)

## 2014-09-15 LAB — WOUND AEROBIC CULTURE

## 2014-09-15 LAB — HEMOGLOBIN A1C: Hemoglobin A1C: 5.6 % (ref 4.2–6.3)

## 2014-09-18 LAB — CBC WITH DIFFERENTIAL/PLATELET
BASOS ABS: 0 10*3/uL (ref 0.0–0.1)
Basophil %: 0.4 %
EOS ABS: 0.4 10*3/uL (ref 0.0–0.7)
Eosinophil %: 4.5 %
HCT: 37.2 % (ref 35.0–47.0)
HGB: 12 g/dL (ref 12.0–16.0)
LYMPHS ABS: 2.2 10*3/uL (ref 1.0–3.6)
Lymphocyte %: 26.9 %
MCH: 29.9 pg (ref 26.0–34.0)
MCHC: 32.3 g/dL (ref 32.0–36.0)
MCV: 92 fL (ref 80–100)
MONO ABS: 0.6 x10 3/mm (ref 0.2–0.9)
Monocyte %: 7.6 %
Neutrophil #: 5 10*3/uL (ref 1.4–6.5)
Neutrophil %: 60.6 %
Platelet: 254 10*3/uL (ref 150–440)
RBC: 4.02 10*6/uL (ref 3.80–5.20)
RDW: 16.5 % — ABNORMAL HIGH (ref 11.5–14.5)
WBC: 8.3 10*3/uL (ref 3.6–11.0)

## 2014-09-18 LAB — VANCOMYCIN, TROUGH: VANCOMYCIN, TROUGH: 13 ug/mL (ref 10–20)

## 2014-09-18 LAB — CREATININE, SERUM
CREATININE: 1.04 mg/dL (ref 0.60–1.30)
EGFR (African American): >60
EGFR (Non-African Amer.): 54 — ABNORMAL LOW

## 2014-09-18 LAB — WOUND CULTURE

## 2014-09-20 LAB — BASIC METABOLIC PANEL
Anion Gap: 7 (ref 7–16)
BUN: 8 mg/dL (ref 7–18)
Calcium, Total: 8.6 mg/dL (ref 8.5–10.1)
Chloride: 108 mmol/L — ABNORMAL HIGH (ref 98–107)
Co2: 25 mmol/L (ref 21–32)
Creatinine: 1.03 mg/dL (ref 0.60–1.30)
EGFR (African American): >60
GFR CALC NON AF AMER: 54 — AB
Glucose: 87 mg/dL (ref 65–99)
Osmolality: 277 (ref 275–301)
POTASSIUM: 4.4 mmol/L (ref 3.5–5.1)
Sodium: 140 mmol/L (ref 136–145)

## 2014-09-20 LAB — CREATININE, SERUM
Creatinine: 1.01 mg/dL (ref 0.60–1.30)
EGFR (African American): >60
GFR CALC NON AF AMER: 56 — AB

## 2014-09-21 LAB — BASIC METABOLIC PANEL
ANION GAP: 8 (ref 7–16)
BUN: 7 mg/dL (ref 7–18)
CALCIUM: 8.6 mg/dL (ref 8.5–10.1)
Chloride: 109 mmol/L — ABNORMAL HIGH (ref 98–107)
Co2: 24 mmol/L (ref 21–32)
Creatinine: 1 mg/dL (ref 0.60–1.30)
EGFR (African American): >60
EGFR (Non-African Amer.): 56 — ABNORMAL LOW
Glucose: 78 mg/dL (ref 65–99)
OSMOLALITY: 278 (ref 275–301)
Potassium: 4.1 mmol/L (ref 3.5–5.1)
Sodium: 141 mmol/L (ref 136–145)

## 2014-09-21 LAB — VANCOMYCIN, TROUGH: VANCOMYCIN, TROUGH: 18 ug/mL (ref 10–20)

## 2014-09-22 LAB — BASIC METABOLIC PANEL
ANION GAP: 6 — AB (ref 7–16)
BUN: 9 mg/dL (ref 7–18)
Calcium, Total: 9 mg/dL (ref 8.5–10.1)
Chloride: 109 mmol/L — ABNORMAL HIGH (ref 98–107)
Co2: 24 mmol/L (ref 21–32)
Creatinine: 0.97 mg/dL (ref 0.60–1.30)
EGFR (African American): >60
GFR CALC NON AF AMER: 58 — AB
GLUCOSE: 83 mg/dL (ref 65–99)
OSMOLALITY: 275 (ref 275–301)
Potassium: 4.4 mmol/L (ref 3.5–5.1)
Sodium: 139 mmol/L (ref 136–145)

## 2014-10-05 ENCOUNTER — Encounter: Payer: Self-pay | Admitting: Surgery

## 2014-10-17 ENCOUNTER — Encounter
Admit: 2014-10-17 | Disposition: A | Discharge status: Home / Self Care | Payer: Self-pay | Attending: Surgery | Admitting: Surgery

## 2014-11-17 ENCOUNTER — Encounter
Admit: 2014-11-17 | Disposition: A | Discharge status: Home / Self Care | Payer: Self-pay | Attending: Surgery | Admitting: Surgery

## 2014-12-08 NOTE — Op Note (Signed)
PATIENT NAME:  [name], [name] MR#:  [id number] DATE OF BIRTH:  September 09, 1927  DATE OF PROCEDURE:  01/21/2013  PREOPERATIVE DIAGNOSIS:  Pneumonia.  POSTOPERATIVE DIAGNOSIS: Pneumonia.  PROCEDURES:  1. Ultrasound guidance for vascular access right basilic vein.  2. Fluoroscopic guidance for placement of catheter.  3. Insertion of a double lumen PICC line, right arm.  SURGEON: Katha Cabal, M.D.  ANESTHESIA: Local.   ESTIMATED BLOOD LOSS: Minimal.   INDICATION FOR PROCEDURE: Requiring antibiotics greater than 5 days.  DESCRIPTION OF PROCEDURE: The patient's right arm was sterilely prepped and draped, and a sterile surgical field was created. The basilic vein was accessed under direct ultrasound guidance without difficulty with a micropuncture needle and permanent image was recorded. 0.018 wire was then placed into the superior vena cava. Peel-away sheath was placed over the wire. A single lumen peripherally inserted central venous catheter was then placed over the wire and the wire and peel-away sheath were removed. The catheter tip was placed into the superior vena cava and was secured at the skin at 35 cm with a sterile dressing. The catheter withdrew blood well and flushed easily with heparinized saline. The patient tolerated procedure well.   ____________________________ Katha Cabal, MD ggs:ea D: 01/21/2013 18:03:01 ET T: 01/22/2013 06:52:52 ET JOB#: [id number]  cc: Katha Cabal, MD, <Dictator> Katha Cabal MD ELECTRONICALLY SIGNED 02/02/2013 16:07

## 2014-12-08 NOTE — H&P (Signed)
PATIENT NAME:  [name], [name] MR#:  [id number] DATE OF BIRTH:  October 30, 1927  DATE OF ADMISSION:  01/20/2013  PRIMARY CARE PHYSICIAN: Christena Flake. Thies, MD    HISTORY OF PRESENT ILLNESS:  The patient is an 81 year old African American female with past medical history significant for history of a recent admission to the hospital for acute gastroenteritis, nausea, vomiting as well as diarrhea, discharged just a few days ago, 01/14/2013.  During this hospitalization, the patient was evaluated by gastroenterologist as well as cardiologist.  She underwent was thought to have viral gastroenteritis, presented back to the hospital from [facility] with increasing confusion as well as altered mental status and recent falls.  According to the patient's son, who was present during my interview, the patient was doing relatively well after discharge; however, she was noted to be weaker as time progressed, and she was also not eating as well as she used to eat in the past. She was noted to have low blood pressure yesterday after the fall; however, the patient's son denies any nausea, vomiting or diarrhea most recently since her discharge.  She also was more confused, and on arrival to the Emergency Room today she was noted to be severely hypoglycemic with the patient's glucose levels Accu-Chek reaching as low as 10.  Even while the patient's glucose level was found to be very low at 10 and below, the patient was alert, and she was able to communicate. So, the patient had a few serum glucose levels checked which were normal high, level of 200s to 300s. The patient was never diagnosed with diabetes, according to medical records.  She  was also found to be somewhat dehydrated with creatinine level of 1.5 as well as somewhat possibly dehydrated with sodium level of 134.  She was also somewhat acidotic with CO2 level of 20 on BMP panel.  Her white blood cell count was found to be elevated, however, the patient was not found to be  febrile, and chest x-ray was unremarkable. The patient otherwise denies any significant abnormalities.  She denies any nausea, vomiting or diarrhea. She denies any other symptoms, however, admits of some weakness and feeling cold. She admits of having some lightheadedness and dizziness yesterday as well as shortness of breath, especially on exertion, according to the patient's son.  The patient was noted to be hypoglycemic.  She was also found to have blue discoloration of her fingertips.  Hospitalist services were contacted for admission.   PAST MEDICAL HISTORY:  Significant for:  1.  History of viral gastroenteritis and discharge from the hospital on 01/14/2013. 2.  History of nausea, vomiting, diarrhea.   3.  A bout of atrial fibrillation, back in sinus rhythm, which was felt to be paroxysmal atrial fibrillation.  4.  Recent esophageal candidiasis.  5.  Hypokalemia.  6.  Hypomagnesemia.  7.  Raynaud's phenomenon history.  8.  History of primary biliary cirrhosis.  9.  History of chronic obstructive pulmonary disease.  10.  Anxiety.  11.  Depression.  12.  Hypertension.  13.  Hyperlipidemia.  14.  Peripheral vascular disease, status post bilateral lower extremity stents. 15.  Suspected ischemic colitis in 2012.  16.  History of diverticulosis.  17.  Renal cell carcinoma.  18.  Glaucoma. 19.  Gout.   MEDICATIONS: According to prior discharge summary, the patient is on:  1.  Singulair 10 mg daily. 2.  Ursodiol 300 mg p.o. twice daily.  3.  ProAir HFA 2 puffs every 4  hours as needed.  4.  Plavix 75 mg p.o. daily.  5.  Vitamin D3, 1000 international units 2 tablets once daily.  6.  Potassium chloride 10 mEq once daily.  7.  Pantoprazole 40 mg p.o. twice daily.  8.  Topamax 25 mg p.o. at bedtime. 9.  Pravastatin 20 mg p.o. daily. 10.  Lasix 10 mg p.o. daily.  11.  Cymbalta 60 mg p.o. daily.  12.  Levothyroxine 25 mcg p.o. daily. 13.   Qvar 2 puffs twice daily.  14.  Betimol  ophthalmic solution 0.5% 1 drop to each affected eye at bedtime.  15.  Gabapentin 600 mg p.o. at bedtime.  16.  Senna 1 to 2 tablets daily.  17.  Allopurinol 100 mg p.o. daily.  18.  Oxycodone 10 mg every 6 hours as needed.  19.  Diflucan 100 mg p.o. for 3 days. 20.  Metoprolol 25 mg p.o. twice a day.   ALLERGIES: ACE INHIBITOR AS WELL AS SEPTRA PER MEDICAL HISTORY.   SOCIAL HISTORY: No alcohol, tobacco or drug abuse.  Resides at [facility] assisted living facility.   FAMILY HISTORY: The patient has several sisters with lung cancer, brother with lung cancer. The patient's father had also lung cancer.   REVIEW OF SYSTEMS:  CONSTITUTIONAL:  Positive for feeling cold for the past few days, some weakness. Denies any fevers or chills, pains, weight loss or gain.  EYES: Denies any blurry vision, double vision, glaucoma or cataracts.  ENT: Denies any tinnitus, allergies, epistaxis, sinus pain, difficulty swallowing  RESPIRATORY: Denies any cough, wheezes, asthma or COPD.  CARDIOVASCULAR: Denies any chest pains, orthopnea, edema, arrhythmias or palpitations. Admits of having some dyspnea on exertion.  GASTROINTESTINAL: Denies any vomiting, diarrhea or constipation. Admits of having poor oral intake. GENITOURINARY: Denies dysuria, hematuria, frequency or incontinence.  ENDOCRINOLOGY:  Denies any polydipsia, nocturia, thyroid problems, heat or cold intolerance or thirst.  HEMATOLOGIC: Denies anemia, easy bruising, bleeding or swollen glands.  SKIN: Denies any acne, rashes or change in moles.  MUSCULOSKELETAL: Denies arthritis, cramps, swelling.  NEUROLOGIC: No numbness, epilepsy or tremor.  PSYCHIATRIC: Denies anxiety, insomnia  PHYSICAL EXAMINATION: VITAL SIGNS: On arrival to the hospital, temperature was 97.9, pulse was 64, respiratory rate was 20, blood pressure 131/64, saturation 97% on room air.  GENERAL: This is a well-developed, well-nourished thin female in no significant distress lying   on the stretcher.  HEENT: Her pupils are equal and reactive to light. Extraocular movements are intact. No icterus or conjunctivitis.  Has mild difficulty hearing.  No pharyngeal erythema. Mucosa is moist.  NECK: Did not reveal any masses, supple, nontender. Thyroid is not enlarged. No adenopathy. No JVD or carotid bruits bilaterally.  Full range of motion.  LUNGS: Clear to auscultation in all fields. No rales, rhonchi, diminished breath sounds or wheezing. No labored inspirations. No increased effort, dullness to percussion or overt respiratory distress.  CARDIOVASCULAR: S1, S2 appreciated. No murmurs, gallops, or rubs noted. Rhythm was regular. PMI is not lateralized. Chest is tender to palpation, 1+ pedal pulses, trace lower extremity edema, but no calf tenderness or cyanosis was noted.  ABDOMEN: Soft, nontender. Bowel sounds are present. No hepatosplenomegaly or masses were noted.  RECTAL: Deferred.  MUSCLE STRENGTH: Able to move all extremities.  The patient does have cyanosis in her hands. Her fingers are very cold to touch. No degenerative joint disease or kyphosis was noted. No CVA tenderness on percussion. Gait is not tested.  SKIN: Skin did not reveal any  rashes, lesions, erythema, nodularity or induration. It was warm and dry to palpation all over; however, very cold, cool and blue discolored fingers were noted in her hands.   LYMPHATIC: No adenopathy in the cervical region.  NEUROLOGICAL: Cranial nerves grossly intact. Sensory is intact.   No dysarthria or aphasia.   The patient is alert and oriented to time, person and place, cooperative. Memory is somewhat impaired, but no significant confusion, agitation or depression were noted.   LABORATORY AND RADIOLOGICAL DATA:   A few BMPs were performed. The patient's first study was done at around 4:00 p.m. on 01/20/2013, showed creatinine of 1.39, glucose of 315, sodium 132. Bicarbonate level low at 20; repeated study done at around 5:30 p.m.  showed glucose of 235, creatinine 1.5, sodium 134.  Bicarbonate level remains low at 20. The patient's liver enzymes were unremarkable; however, the patient's alkaline phosphatase was elevated at 169, albumin level of 2.5. Troponin level less than 0.02. CBC:  White blood cell count is elevated to 15.111,  hemoglobin 11.2 and platelet count 255. Differential was not performed. Urinalysis was done, and it showed yellow clear urine, more than 500 glucose, negative for bilirubin or ketones. Specific gravity was 1.015, pH was 5.0, negative for blood, protein, nitrites or leukocyte esterase, 3 red blood cells, 4 white blood cells. No bacteria were seen.  (Dictation Anomaly) 2 epithelial  cells were seen. Mucus was present as well as 3 hyaline casts. ABGs were done on room air and showed pH of 7.25, pCO2 of 45, pO2 was 64, saturation was 94.4%, with lactic acid level elevated to 2.4, and bicarbonate level low at 19.7. Meanwhile, Accu-Cheks were done, and they were all over and spectrum ranging from less than 10 to 427.   EKG he showed normal sinus rhythm at 67 beats per minute, normal axis, low voltage QRS, QS in V1 as well as inferior leads, no acute ST-T changes were, however, noted.  (Dictation Anomaly) Q in inferior leads and increased T wave amplitude in anterior leads were noted as compared with EKG done on 01/10/2013.   Chest x-ray, portable single view 01/20/2013 showed no evidence of pneumonia or acute cardiopulmonary abnormality. CT scan of head without contrast 01/20/2013 showed changes of atrophy with chronic microvascular ischemic disease. No acute intracranial abnormality evident.   ASSESSMENT AND PLAN: 1.  Hypoglycemia of unclear etiology at this time, questionable anaerobic metabolism in Raynaud's affected fingers: ABGs, however, showed significant acidosis. We will continue the patient on IV fluids for now with D5 solution, following her glucose levels frequently.  2.  Acidosis of unclear etiology,  questionable sepsis versus just volume contraction as the patient is on diuretics, and she seemed to be dehydrated at this time.  She is afebrile, however, her white blood cell count is elevated. She has some urinalysis abnormalities, and she may have a urinary tract infection. We will be getting blood cultures and we will start her on antibiotic therapy with Rocephin following her culture results.  3.  Failure to thrive, adult: We will hold the Lasix, and we will ask dietitian to see patient, and we will start the patient on Ensure.   4.  Weakness:  We will ask physical therapist evaluation as the patient may need to have a higher level of care.  5.  Dehydration:  As mentioned above, we will continue IV fluids and we will hold Lasix.  6.  Renal insufficiency: We will continue IV fluids.  7.  Leukocytosis with concerns of  urinary tract infection:  Antibiotic therapy will be initiated this time during admission.   TIME SPENT: 55 minutes.   ____________________________ Theodoro Grist, MD rv:cb D: 01/20/2013 19:34:54 ET T: 01/20/2013 20:37:59 ET JOB#: [id number]  cc: Theodoro Grist, MD, <Dictator> Christena Flake. Raechel Ache, MD  Theodoro Grist MD ELECTRONICALLY SIGNED 02/24/2013 11:32

## 2014-12-08 NOTE — Consult Note (Signed)
Pt CC of vomiting and diarrhea have resolved, eating ok. I will sign off , reconsult if needed.  Electronic Signatures: Manya Silvas (MD)  (Signed on 30-May-14 15:12)  Authored  Last Updated: 30-May-14 15:12 by Manya Silvas (MD)

## 2014-12-08 NOTE — Consult Note (Signed)
Chief Complaint and History:  Referring Physician Dr. Tressia Miners   Chief Complaint Hypoglycemia   Allergies:  Ace Inhibitors: Cough  Septra: N/V/Diarrhea  Assessment/Plan:  Assessment/Plan Patient seen in consultation for hypoglycemia. No history of diabetes. Sugars on fingerstick testing have been <10 at times and yet she has not exhibited dramatic mental status changes. Fingerstick sugars do not match with venous blood glucose levels (BG), which have been more consistently nml and in the 70-100 range during her last hospitalization just 2 weeks ago. Due to concerns of low sugars on fingersticks, she has been treated with repeated boluses of D50 with subsequent venous sugars that are elevated (eg on 6/5 at 3:40PM after D50, her venous was 315 and yet at 4:08 PM a fingerstick was <10 on repeat testing). Hgb A1c is normal at 5.4%. She has Raynauds disease.  A/P Erroneous fingerstick blood sugars which are misleading -- plan is to place  PICC line and rely on venous sugars for BG monitoring. I suspect that if we can obtain nml sugars for a period of 24 hours, then she would not need ongoing BG monitoring.  Please call if questions. Full consult will be dictated.   Electronic Signatures: Judi Cong (MD)  (Signed 06-Jun-14 13:16)  Authored: Chief Complaint and History, ALLERGIES, Assessment/Plan   Last Updated: 06-Jun-14 13:16 by Judi Cong (MD)

## 2014-12-08 NOTE — Consult Note (Signed)
CC": vomiting and diarrhea.  Pt had HR of 132 with some irregularity.  Will put on off unit telemetry.  Electronic Signatures: Manya Silvas (MD)  (Signed on 27-May-14 19:13)  Authored  Last Updated: 27-May-14 19:13 by Manya Silvas (MD)

## 2014-12-08 NOTE — Consult Note (Signed)
Brief Consult Note: Diagnosis: gastritis with nausea and vomiting and afib with rvr.   Patient was seen by consultant.   Consult note dictated.   Recommend further assessment or treatment.   Comments: 81 yo female with no apparent prior cardiac disease admitted with nausea, vomiting and developed afib with rapid ventricular response. She was given metoprolol and cardizem with some improvement in her heart rate. She denies chest pain or shortness of breath. Echo reveals preserved lv function. Afib likely exacerbated by gi distress.  Not a candidate for chronic anticoagulation at present until her gi symptoms have improved. WIll continue with beta blockers and calcium channel blockers and follow rate.  Electronic Signatures: Teodoro Spray (MD)  (Signed 28-May-14 17:41)  Authored: Brief Consult Note   Last Updated: 28-May-14 17:41 by Teodoro Spray (MD)

## 2014-12-08 NOTE — Consult Note (Signed)
PATIENT NAME:  [name], [name] MR#:  [id number] DATE OF BIRTH:  25-Dec-1927  DATE OF CONSULTATION:    REFERRING PHYSICIAN:  Vivien Presto, MD CONSULTING PHYSICIAN:  Manya Silvas, MD/Essica Kiker A. Jerelene Redden, ANP (Adult Nurse Practitioner)  REASON FOR CONSULTATION: Nausea and vomiting with dysphagia.   HISTORY OF PRESENT ILLNESS: This 81 year old patient with past medical history of renal cell carcinoma treated with cryoablation, peripheral vascular disease, ischemic colitis, gout, hypertension, GERD, primary biliary cirrhosis and dysphagia, presented to the hospital yesterday with a 2-day history of nausea, vomiting and diarrhea. The patient is a resident of the [name] and reports that there have been some patients that have had similar problems. The patient denies  history of any visitors to her room that have had illness. She states the symptoms started 2 days ago and has been having severe nausea and just states she just does not feel well at all today. She continues to have some problem with food and liquids hanging in the throat and a little bit of pain with swallowing. She had a recent EGD performed on 11/22/2012 for dysphagia investigation and it showed monilial esophagitis and a low-grade narrowing of Schatzki ring  that was not dilated. The patient had been treated with Diflucan and on followup office visit in the clinic had reported about 90% improvement on therapy. She is currently receiving IV Diflucan as well as her IV Protonix. She requires antiemetic medication. The patient reports she vomited just a few minutes ago and has vomited about 3 times today. In addition, she is having loose bowel movements described as diarrhea and when the symptoms first started, she was passing her stools every 5 minutes. She says now that she has been here, she has only had a few bowel movement. She has noted no blood or melena. She reports weight loss of 10 pounds over the last 2 months and poor appetite. She has  had some chills, has an intermittent chronic headache and is a little more short of breath than her baseline. She has had some cough, nonproductive. The patient states that she does cough sometimes when she eats. Her most recent colonoscopy was performed 08/22/2011 for chronic diarrhea and it revealed diverticulosis. Biopsies obtained of the right colon showed focal mild active colitis and of the left colon showed mild active colitis with architectural features of chronicity, and the focal changes were suggestive of ischemia. There was negative dysplasia or malignancy.   PAST MEDICAL HISTORY: 1.  Hypertension.  2.  Hypercholesterolemia.  3.  Anxiety, depression.  4.  COPD.  5.  Raynaud's.  6.  Severe spinal stenosis.  7.  History of SVT. 8.  Peripheral artery disease.  9.  Renal cell carcinoma treated with cryoablation, Dr. Jacqlyn Larsen.  10.  Primary biliary cirrhosis.  11.  Gout.  12.  Peripheral vascular disease.  13.  Possible ischemic colitis per colonoscopy biopsy, 2013.  14.  Failure to thrive.  15.  GERD with dysphagia and low-grade narrowing of Schatzki ring found at the gastroesophageal junction.  16.  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  17.  Glaucoma.  PAST SURGICAL HISTORY: 1.  Cataract surgery.  2.  Hysterectomy.  3.  Renal cell carcinoma with right percutaneous renal cryotherapy in January 2009.   4.  Back surgery for spinal stenosis, March 2013.   MEDICATIONS ON ADMISSION:  1.  Plavix 75 mg daily.  2.  Duloxetine 60 mg daily.  3.  Lasix 10  mg daily.  4.  Levothyroxine 25 mcg daily.  5.  Montelukast 10 mg daily.  6.  Potassium 10 mEq daily.  7.  Uloric 40 mg daily (febuxostat).  8.  Vitamin D3, 1000 units daily.  9.  Diflucan 100 mg 2 tablets once daily.  10.  Metoprolol succinate 12.5 mg twice daily.  11.  Morphine sulfate 30 mg SA 1 tablet twice daily.  12.  Pantoprazole 40 mg twice daily.  13.  QVAR 80 mcg 2 puffs 2 times  daily.  14.  Ursodiol 300 mg twice daily.  15.  Ensure 1 can 3 times daily between meals.  16.  Betimol 0.5% eye drop in each eye at bedtime.  17.  Gabapentin 600 mg at bedtime.  18.  Pravastatin 20 mg daily.  19.  Topiramate 25 mg at bedtime.  20.  Nystatin swish and swallow 4 times a day.  21.  Senna p.r.n.  22.  Allopurinol.  ALLERGIES:  1.  ACE INHIBITOR.  2.  SEPTRA PER CHART.   HABITS: Negative tobacco or alcohol. The patient currently lives at the [facility], retired.   FAMILY HISTORY: Strong for lung cancer. Negative for GI malignancy.   REVIEW OF SYSTEMS: Twelve systems reviewed and positive for about 10-pound weight loss over 1 month reported, some weakness, fatigue, just generally does not feel well. Main complaint now is nausea, has had several episodes of vomiting and diarrhea today. She denies any abdominal pain or tenderness. Shortness of breath is slightly worsened from baseline, some cough, does not believe she has a chest infection. Dysphagia as reported in history of present illness. Remaining systems otherwise negative.   PHYSICAL EXAMINATION: VITAL SIGNS: Temperature 98.6, pulse 78, respiratory rate 20, blood pressure 162/78, pulse oximetry on admission room air 85%, on 5 liters nasal cannula 100%, and currently she is at 3 liters nasal cannula.  GENERAL: The patient is an elderly Caucasian female resting in bed, NAD. HEENT: She has some temporal wasting. Head is normocephalic. Oral mucosa is dry and intact.  NECK: Supple. Trachea is midline. No lymphadenopathy.  HEART: Heart tones S1, S2, without murmur or gallop.  LUNGS: CTA. Respirations are just a little tachypneic when she is talking. No cough noted. Good breath sounds bilateral.  ABDOMEN: Soft. Bowel sounds are present and she denies any abdominal pain on exam.  RECTAL: Deferred.  EXTREMITIES: Lower extremities without edema, cyanosis or clubbing.  SKIN: Warm and dry without rash.  NEUROLOGIC: Cranial  nerves II through XII grossly intact. She follows commands and will assist with position changes. Gait not evaluated.  MUSCULOSKELETAL: No joint swelling or deformity noted.   LABORATORY, DIAGNOSTIC AND RADIOLOGICAL DATA: Admission labs notable for glucose 107, BUN 8, creatinine 0.87, potassium 3.1, albumin 2.9, total bilirubin 0.4, alkaline phosphatase 231, ALT 12. WBC 10.9, increased to 13.9 today, hemoglobin 12.7 to 13.2, platelet count 374. Urinalysis with WBCs 5 per high-power field, trace bacteria, hyaline casts 3 per LPF. Magnesium 1.5. No radiology studies to report. EKG has been ordered.   IMPRESSION:  1.  The patient presents with 2-day history of acute nausea, vomiting and diarrhea, which given the nursing home population  may represent a viral gastroenteritis. The patient has had chronic problems with dysphagia, known Schatzki ring as per  records, on EGD last month was not dilated. She is also known to have esophageal spasm and significant esophageal candidiasis, for which she presents on treatment. Those problems may still be remaining. The patient had a  history of biopsy suggestion of ischemic colitis per colonoscopy last year, may explain diarrhea but need to consider bacterial etiologies as well to include. Clostridium difficile or other comprehensive infections.  2.  The patient is with primary biliary cirrhosis. Alkaline phosphatase is at her baseline, albumin is down suggestive of her poor nutritional status and weight loss. Need to consider other biliary abnormality such as hepatoma development. Doubt progression to cirrhosis, as she has a normal platelet and recent normal PT/INR per clinic notes.  3.  Shortness of breath, hypoxemia on admission, to include pulmonary status change. Lung sounds are clear. No clinical auscultation of pneumonia or congestive heart failure. She has mild leukocytosis.  4.  History of renal cell cancer, 2009, treated with cryotherapy. Abnormal urinalysis  noted.   PLAN: 1.  To begin work-up with chest x-ray PA and lateral, and abdominal ultrasound exam of the liver, gallbladder and bile ducts. Will obtain AFP, BNP and lipase. 2.  For diarrhea, obtain stool studies for comprehensive culture, C. difficile.  3.  Continue with antiemetics, IV Protonix and IV Diflucan.  4.  Dr. Vira Agar is not in a rush to repeat the EGD at this time and would like to treat the nausea, vomiting and diarrhea with supportive measures initially. Further GI recommendations pending results. This case was discussed with Dr. Vira Agar in collaboration of care.   Thank you for the consultation.   These services provided by Joelene Millin A. Jerelene Redden, MS, APRN, BC, ANP, under collaborative agreement with Manya Silvas, MD.   ____________________________ Janalyn Harder Jerelene Redden, ANP (Adult Nurse Practitioner) kam:jm D: 01/11/2013 14:29:58 ET T: 01/11/2013 15:11:56 ET JOB#: [id number]  cc: Joelene Millin A. Jerelene Redden, ANP (Adult Nurse Practitioner), <Dictator> Janalyn Harder Sherlyn Hay, MSN, ANP-BC Adult Nurse Practitioner ELECTRONICALLY SIGNED 01/11/2013 16:05

## 2014-12-08 NOTE — Discharge Summary (Signed)
PATIENT NAME:  [name], [name] MR#:  [id number] DATE OF BIRTH:  [birth year]  DATE OF ADMISSION:  01/21/2013 DATE OF DISCHARGE:    CONSULTANT: Dr. Gabriel Carina from endocrinology and Dr. Izora Gala Phifer from palliative care.   CHIEF COMPLAINT: Altered mental status, low blood sugars reading on fingerstick checks.   DISCHARGE DIAGNOSES:  1.  Low blood sugars, likely erroneous in the setting of Raynaud's disease.  2.  Failure to thrive.  3.  Altered mental status, likely secondary to dehydration and poor p.o. intake.  4.  Gastroesophageal reflux disease. 5.  Hypothyroidism.  6.  Recent viral gastroenteritis.  7.  History of recent candidal esophagitis.  8.  Asymptomatic bacteriuria.  9.  History of Raynaud's.  10. History of nausea, vomiting and diarrhea.  11. Paroxysmal atrial fibrillation.  12. Hypokalemia.  13. History of hypomagnesemia.  14. History of primary biliary cirrhosis.  15. History of chronic obstructive pulmonary disease.  16. History of anxiety.  17. History of depression.  18. Hypertension.  19. Hyperlipidemia.  20. Peripheral vascular disease.  21. Suspected ischemic colitis in 2012.  22. History of diverticulosis.  23. History of renal cell carcinoma.  24. History of glaucoma.  25. History of gout.   DISCHARGE MEDICATIONS: Ursodiol 300 mg 3 times a day, Pro-Air HFA 90 mcg 2 puffs every 4 hours as needed, vitamin D 3000 units 2 tabs once a day, potassium chloride 10 mEq daily, Protonix 40 mg 2 times a day, levothyroxine 25 mcg daily, Qvar 80 mcg 2 puffs 2 times a day, Betimol 0.5% ophthalmic solution 1 drop to affected eye once a day at bedtime, gabapentin 600 mg once a day, allopurinol 100 mg daily, Plavix 75 mg daily, duloxetine 60 mg once a day, furosemide 20 mg 1/2 tab once a day, morphine extended-release 30 mg per 12 hour 1 tab 2 times a day, [place]/F 50 mg/8.6 mg 1 tab 2 times a day, montelukast 10 mg once a day, pravastatin 20 mg daily, topiramate 25 mg daily,  metoprolol tartrate 25 mg daily,  nasal spray two sprays every hour as needed for nasal dryness.   DIET: Low sodium.   ACTIVITY: As tolerated.   CODE STATUS: The patient is DNR.  FOLLOWUP: Please follow with PCP within 1 to 2 weeks.   SIGNIFICANT LABS AND IMAGING: Insulin 70.6, c-peptide 8.1 and sulfonylurea screen result is pending. Blood sugars from finger sticks on 06/05 was 22, then 15, then 46, then 50 and then a blood sugar reading was 37 on 06/05 at 3:30, but serum glucose was 315 at the same time. Initial BUN was 13, creatinine 1.39, sodium 132. BMP was 492 on 06/06. LFTs showed alkaline phosphatase of 169 on arrival, albumin of 2.5, ALT 10, AST 18. Troponin negative. TSH was 1.42. Hemoglobin A1c was 5.4 on 06/05. Initial WBC 15.1, hemoglobin 11.2. Urinalysis showed  30,000 CFU of E. coli and 50,000 CFU of enterococcus. Blood cultures on 06/05 no growth to date x 2. Urinalysis on arrival showed no nitrites or leukocyte esterase, 3 RBCs and 4 WBCs. Initial ABG showed pH of 7.25, pCO2 of 45, pO2 of 64. CT of the head on arrival: No acute disease.  HISTORY OF PRESENT ILLNESS AND HOSPITAL COURSE: This is a pleasant 81 year old African American female with multiple comorbidities, who was just discharged for likely viral gastroenteritis, nausea and vomiting who came in for confusion, altered mental status and recent falls. The patient apparently had some low blood sugars on inger sticks as low  as 10, which did not correlate to BMP derived blood glucoses. She had a somewhat low CO2 with possible mild acidosis. She was admitted to the hospitalist service for further evaluation and management. In regards to the low blood sugars, this was likely not true hypoglycemia and likely Raynaud's causing vasoconstriction of peripheral arteries causing low blood sugars just like last hospitalization she had low pulse oximetry from her fingertips. The low blood sugar from finger sticks did not correlate with serum  derived blood glucose levels and endocrinology was consulted, who agreed. She did have mildly elevated insulin and C-peptide, but that was postprandial and the patient had a hemoglobin A1c, which was low nor elevated. Altered mental status was likely secondary to poor p.o. intake and mild dehydration. She was seen by PT. Initially Lasix was held. She was given some fluids and the patient did improve. The patient had blood cultures and urine cultures stent. Blood cultures are no growth to date. Urine cultures show less than 100,000 CFUs of 2 organisms and likely is asymptomatic bacteriuria. At this point, she is back to her baseline and she will be discharged with home health, PT R.N. and nurse. She does not have any further dysphagia and would stop Diflucan at this point.   TOTAL TIME SPENT: 35 minutes.  ____________________________ Vivien Presto, MD sa:aw D: 01/24/2013 11:26:00 ET T: 01/24/2013 12:33:27 ET JOB#: [id number]  cc: Vivien Presto, MD, <Dictator> Vivien Presto MD ELECTRONICALLY SIGNED 02/11/2013 20:58

## 2014-12-08 NOTE — Consult Note (Signed)
PATIENT NAME:  [name], [name] MR#:  [id number] DATE OF BIRTH:  [birth year]  DATE OF CONSULTATION:  01/21/2013  REFERRING PHYSICIAN: Gladstone Lighter, MD CONSULTING PHYSICIAN:  A. Lavone Orn, MD  CHIEF COMPLAINT: Hypoglycemia.   HISTORY OF PRESENT ILLNESS: This is an 81 year old female seen in consultation for concerns of hypoglycemia. The patient resides at a nursing facility. She was brought in yesterday for confusion. Initial blood sugar on fingerstick testing was 22, repeat levels were 15. She was treated with several amps of D50 and despite this fingersticks remained extremely low and in the range of less than 10 to 50. However, it should be noted that after D50, a venous blood sugar was increased to 315, yet within 20 minutes of fingerstick was less than 10. She does have Raynaud's disease. There has been a discrepancy between the fingerstick and venous blood sugar testing. The patient hospitalized from 05/28 to 05/30 at this facility for viral gastroenteritis. During that hospitalization, she had normal venous blood sugars in the range of 82 to 110. She was not getting fingerstick testing at that time. Her current hemoglobin A1c is normal at 5.4%. Again, she has no known history of diabetes. There has been no known exposure to hypoglycemic medications. During episodes in which fingerstick blood sugar readings were quite low, she did not have a change in her mental status. At this time, her last fingerstick sugar was 17 and yet she is fully conversive. The patient was interviewed with her son at the bedside. He believed she is currently in her usual state of health. He believes she has lost about 10 pounds in the last several weeks, however, in the months prior she may have gained about 10 pounds. He has not noticed any recent change in her eating habits.   PAST MEDICAL HISTORY: 1.  Raynaud's.   2.  Viral gastroenteritis, May 2014.  3.  Paroxysmal atrial fibrillation, May 2014.  4.   Esophageal candidiasis, May 2014.  5.  Primary biliary cirrhosis.  6.  COPD.  7.  Anxiety/depressive disorder.  8.  Hypertension.  9.  Hyperlipidemia.  10.  Peripheral vascular disease.  11. Renal cell carcinoma.  12.  Gout.  13.  Glaucoma.   ALLERGIES: SEPTRA, ACE INHIBITORS.   SOCIAL HISTORY: The patient resides at the [facility]. She does not smoke cigarettes or drink any alcohol.   FAMILY HISTORY: The patient denies any diabetes in her immediate family members.   CURRENT MEDICATIONS: 1.  Allopurinol 100 mg daily.  2.  Vitamin D 1000 units daily.  3.  Duloxetine 60 mg daily.  4.  Metoprolol 25 mg daily.  5.  Morphine ER 30 mg q. 12 hours. 6.  Pantoprazole 40 mg b.i.d.  7.  Timolol eyedrops 0.05% OU daily.  8.  Actigall 300 mg b.i.d.  9.  Clopidogrel 75 mg daily.  10.  Heparin 5000 units q. 8 hours.  11.  Fluticasone 220 mcg 2 puffs q. 12 hours.   REVIEW OF SYSTEMS:  GENERAL:  Possible weight changes, as per HPI.  No excessive fatigue.  HEENT: Denies blurred vision. Denies sore throat.  NECK: Denies neck pain.  CARDIAC: Denies chest pain or palpitations.  PULMONARY: Denies cough or shortness of breath.  ABDOMEN: Appetite is decreased. She denies abdominal pain. She reports some loose stools both yesterday and the day prior. No bowel movements yet today. She denies blood in the stool.  GENITOURINARY: She denies dysuria.  EXTREMITIES: She denies leg swelling.  NEUROLOGIC: She denies paresthesias or numbness in the extremities. No known recent falls.  MUSCULOSKELETAL: She ambulates with a walker.   PHYSICAL EXAMINATION: VITAL SIGNS: Height 60.9 inches, weight 171 pounds, BMI 32, temp 96.5, pulse 67, respirations 16, blood pressure 111/65, pulse ox 97% on room air.  GENERAL: Well-developed, well-nourished African American female in no acute distress.  HEENT: EOMI.  Oropharynx is clear. She is edentulous and wearing upper and lower dentures.  NECK: Supple. No  thyromegaly.  HEART:  Regular rate and rhythm.  LUNGS:  Clear to auscultation bilaterally. No wheeze.  ABDOMEN: Diffusely soft, nontender, nondistended. Positive bowel sounds.  EXTREMITIES: No edema is present.  SKIN: No rash or dermatopathy is present. No striae are present on the abdomen.  No hyperpigmentation in the Palmer creases is seen.  PSYCHIATRIC: Alert, oriented x 3, calm and cooperative.  NEUROLOGIC: No focal cranial nerve deficits. No tremor of outstretched hands. EOMI.  LABORATORY DATA:  Glucose 73. BNP 492. BUN 10, creatinine 1.1, sodium 135, potassium 4.2, albumin 2.5, AST 17, ALT 19. Hemoglobin 11.5, hematocrit 35.3, WBC 10.2, platelets 272.   ASSESSMENT: An 81 year old female with Raynaud's and inaccurate fingerstick blood sugars. Fingerstick blood sugars are misleading and suggesting hypoglycemia, however, venous sugars are consistently normal.   RECOMMENDATIONS: She has orders to have a PICC placed. I think that should be a reliable source of blood sugar monitoring. She could have blood sugar monitoring every few hours for the next 24 hours, and if blood sugars remain normal, I would stop with the routine monitoring. Again, she has no evidence of diabetes, she has a normal A1c, and she has not had symptoms consistent with the hypoglycemia, as previously noted. Encourage good nutrition.   Thank you for the kind request for consultation. I will be available to see the patient again on Monday, if needed.  ____________________________ A. Lavone Orn, MD ams:sb D: 01/21/2013 13:56:57 ET T: 01/21/2013 14:36:09 ET JOB#: [id number]  cc: A. Lavone Orn, MD, <Dictator> Sherlon Handing MD ELECTRONICALLY SIGNED 01/27/2013 17:31

## 2014-12-08 NOTE — Discharge Summary (Signed)
PATIENT NAME:  [name], [name] MR#:  [id number] DATE OF BIRTH:  1928/02/11  DATE OF ADMISSION:  01/12/2013. DATE OF DISCHARGE:  01/14/2013.   CONSULTANTS:  Dr. Vira Agar from GI, Dr. Ubaldo Glassing from Cardiology.   CHIEF COMPLAINT:  Nausea, vomiting, diarrhea.   PRIMARY CARE PHYSICIAN:  Dr. Dorthula Perfect at [facility].   DISCHARGE DIAGNOSES:  1.  Nausea, vomiting, diarrhea, likely a viral gastroenteritis.  2.  A bout of atrial fibrillation, now back to sinus, and this is likely paroxysmal atrial fibrillation.  3.  Recent esophageal candidiasis.  4.  Hypokalemia.  5.  Hypomagnesemia.  6.  Reynaud's phenomenon history.  7.  A history of primary biliary cirrhosis.  8.  Chronic obstructive pulmonary disease.  9.  Anxiety.  10.  Depression.  11.  Hypertension.  12.  Hyperlipidemia.  13.  Peripheral vascular disease.  14.  Status post bilateral lower extremity stents.  15.  Suspected ischemic colitis in 2012.  16.  A history of diverticulosis.  17.  A history of renal cell carcinoma.  18.  A history of glaucoma.  19.  Gout.  DISCHARGE MEDICATIONS:  Singulair 10 mg daily, ursodiol 300 mg 2 times a day, ProAir HFA 90 mcg 2 puffs every 4 hours as needed, Plavix 75 mg daily, vitamin D3 1000 international units 2 tabs once a day, potassium chloride 10 mEq daily, pantoprazole 40 mg 2 times a day, Topamax 25 mg at bedtime, pravastatin 20 mg daily, Lasix 10 mg daily, Cymbalta 60 mg daily, Levothyroxine 25 mcg daily, Qvar 80 mcg per inhaled aerosol 2 puffs 2 times a day, Betimol   ophthalmic solution 0.5% one drop to each affected eye once a day at bedtime, gabapentin 600 mg at bedtime, Senna 1 tab 2 times a day, allopurinol 100 mg daily, oxycodone 10 mg every 6 hours as needed for pain, Diflucan 100 mg once a day for 3 days, and metoprolol 25 mg 2 times a day.   DIET:  Low sodium.   ACTIVITY:  As tolerated.  DISCHARGE INSTRUCTIONS:  Referral to PT. Please followup with PCP and cardiologist within 1 to 2 weeks.    DISPOSITION:  Home.   CODE STATUS:  FULL CODE.   SIGNIFICANT LABS AND IMAGING:  Initial potassium 3.1, serum O2 19, initial BUN 8, creatinine 0.87. BNP was 4252 on May 27th, alk phos 231, albumin 2.9, otherwise LFTs within normal limits. Initial WBC 10.9, the last WBC 15.5. Blood cultures and urine cultures on May 28th, no growth to date. UA on arrival had a trace bacteria, no nitrites and trace leukocyte esterase. AFP tumor marker was 4.2.  Doppler showed an EF of 55% to 60%. CT of abdomen and pelvis with contrast on May 28th showed no acute findings in the abdomen or pelvis. A subcentimeter low attenuation focus within the pancreas as nonspecific. A followup contrast enhanced abdominal CT is suggested in 6 months. Ultrasound of abdomen and showed no acute findings.   HISTORY OF PRESENT ILLNESS AND HOSPITAL COURSE:  For full details of H and P, please see the dictation on May 26th by Dr. Bridgette Habermann, but briefly, this is a pleasant 81 year old female who lives at [facility] who has multiple medical issues, came in with nausea, vomiting, diarrhea and p.o. intolerance, and the patient did have a few sick contacts at the [facility].  She, of note, also had significant candidiasis on an EGD and was admitted to the hospitalist service with IV fluids, Zofran for symptomatic relief. Stool studies,  including C. diff were sent; however, the diarrhea had improved and she did not have any loose stools here so we could send in labs. Blood cultures and urine cultures were sent. She was started on a PPI b.i.d. as well as Diflucan IV for the possibility of this being a reflux esophagitis or from the candidiasis. She was also seen by GI. She did have some  leukocytosis and given the history of possible ischemic colitis was started on Zosyn and a CAT scan was obtained, the result of which is dictated above. At this point, the patient has resolved nausea, vomiting. Her diet has been advanced.   Her hospitalization was also  complicated by paroxysmal atrial fibrillation. She gave went into a-fib with RVR and required IV beta blocker and calcium channel blocker and her beta blocker dosage was increased. She converted back to sinus. Her blood pressure and heart rate is stable. She will be discharged on a beta blocker and is to follow with Cardiology at [facility] She will be, per Cardiology, discharged with Plavix. Her low potassium and magnesium were repleted as needed.  CODE STATUS:  FULL CODE.   TOTAL TIME SPENT:  35 minutes.   ____________________________ Vivien Presto, MD sa:jm D: 01/14/2013 15:21:00 ET T: 01/14/2013 16:36:17 ET JOB#: [id number]  cc: Vivien Presto, MD, <Dictator> Christena Flake. Raechel Ache, MD Vivien Presto MD ELECTRONICALLY SIGNED 01/19/2013 22:18

## 2014-12-08 NOTE — Consult Note (Signed)
Chief Complaint:  Subjective/Chief Complaint "I'm nauseated. My stomach hurts only when you mash on it hard "-lower abd/pelvi region bilateral. She vomited yest at 0500, tolerated clear liquids with decreased appetite all day yest and no further vomiting until shortly after taking am pills this am. No abd pain except with moderate palpation. Rising WBC. A fib development last night. No stools.   VITAL SIGNS/ANCILLARY NOTES: **Vital Signs.:   28-May-14 08:55  Vital Signs Type Recheck  Temperature Temperature (F) 96.5  Celsius 35.8  Temperature Source AdultAxillary  Pulse Pulse 94  Respirations Respirations 20  Systolic BP Systolic BP [id number]  Diastolic BP (mmHg) Diastolic BP (mmHg) 80  Mean BP 100  Pulse Ox % Pulse Ox % 100  Pulse Ox Activity Level  At rest  Oxygen Delivery Room Air/ 21 %   Brief Assessment:  Cardiac Irregular   Respiratory normal resp effort   Gastrointestinal details normal Soft   Gastrointestinal details abnormal Tender....   Tenderness LLQ  RLQ   Assessment/Plan:  Assessment/Plan:  Assessment Lower abdominal tenderness, no pain otherwise. No BM. Daily continuous nausea-vomiting yest at 0500 and today immediatley after swallowing pills. Current candidiasis esophagitis on treatment. Increasing leukocytosis with low grade fever 100. Possible resolving gastroenteritis but with new start a fib-concerning for more serious problem. She has a history of ischemic colitis and that needs to be ruled out as well as other causes for colitis, diverticulitis, appendicitis, malignancy outside of the colon. Recent colonsocopy as noted.   Plan Blood and urine cultures. Then, emperic Zosyn. CT of abd/pelvis with oral and IV contrast today. This case was disussed with Dr. Vira Agar in collaboration of care. Further gi recommendations pending findings.   Electronic Signatures: Gershon Mussel (NP)  (Signed 28-May-14 09:31)  Authored: Chief Complaint, VITAL SIGNS/ANCILLARY NOTES,  Brief Assessment, Assessment/Plan   Last Updated: 28-May-14 09:31 by Gershon Mussel (NP)

## 2014-12-08 NOTE — Consult Note (Signed)
CC: vomiting and diarrhea.  Pt [place] of abd did not show any abnormality, CXR also was neg.  She may have had gastroenteritis since supposedly several members of nursing home community have been ill recently.  Pt swallowing problems decreased significantly after treatment with anti fungal regimen.  Will follow with you, if signif dysphagia may repeat EGD and do dilatation.  See NP consult.  BNP over 4200.  so will not rush into EGD.   Electronic Signatures: Manya Silvas (MD) (Signed on 27-May-14 17:19)  Authored   Last Updated: 27-May-14 17:22 by Manya Silvas (MD)

## 2014-12-08 NOTE — Consult Note (Signed)
CC: vomiting and diarrhea.  She feels much better today and wanted solid food. Diet changed to full liquid and to mechanical soft if tolerated.  Abd pain gone.  She thinks she can go back to nursing home tomorrow if no new problems arise.  Her BNP has doubled since admission and this may be a factor on plans for discharge.   Electronic Signatures: Manya Silvas (MD) (Signed on 29-May-14 17:21)  Authored   Last Updated: 29-May-14 17:47 by Manya Silvas (MD)

## 2014-12-08 NOTE — H&P (Signed)
PATIENT NAME:  [name], [name] MR#:  [id number] DATE OF BIRTH:  [birth year]  DATE OF ADMISSION:   01/10/2013  REFERRING PHYSICIAN:   Dr. Corky Downs   PRIMARY CARE PHYSICIAN:   Dr. Raechel Ache at [facility]:   Nausea, vomiting, diarrhea.   HISTORY OF PRESENT ILLNESS:  The patient is a pleasant, 81 year old African-American female with multiple comorbidities, including history of COPD, anxiety, depression, primary biliary cirrhosis, GERD, and a recent EGD on 11/22/2012 showing significant candidiasis and a Schatzki ring, which was dilated. She presents with nausea, vomiting and diarrhea since 2 days. The patient describes several other members of our [facility] facility who have similar symptoms. She has had significant nausea, and has vomited 5 to 6 times a couple of days ago. She has persistent nausea and vomiting, and unable to keep anything down. The patient also has diarrhea. The nausea and vomiting is persistent, and she now actively is vomiting while I was in the room, greenish bilious vomitus. The patient also describes diarrhea which is dark without any blood. She has had about 5 to 7 bouts of diarrhea in the last couple of days, and about 2 today. She has no abdominal pain. She has been given a couple of IV doses of Zofran, but she is still symptomatic, and also has received 2 liters of IV fluid. Hospitalist services were contacted for further evaluation and management.   PAST MEDICAL HISTORY: Multiple, including hypertension, history of Raynaud's phenomenon, hyperlipidemia, COPD, anxiety, depression, primary biliary cirrhosis, GERD,  recent candidiasis of esophagus, peripheral vascular disease, status post bilateral lower extremity stents, suspected ischemic colitis in December 2012, history of diverticulosis, history of renal cell carcinoma, glaucoma, gout.    ALLERGIES:  ACE INHIBITOR and SEPTRA, per chart.   SOCIAL HISTORY:  No tobacco, alcohol or drug use. Currently resides at the [facility]  assisted living facility.   FAMILY HISTORY:  Several sisters with lung cancer, brother with lung cancer. Father also had lung cancer, per patient.   OUTPATIENT MEDICATIONS:   Plavix 75 mg daily, duloxetine 60 mg daily, Lasix 10 mg daily, levothyroxine 25 mcg daily, montelukast 10 mg daily, potassium 10 mEq daily, Uloric 40 mg daily, vitamin D3, 1000 units daily,   metoprolol succinate 12.5 mg twice daily, morphine sulfate 30 mg SA 1 tab twice daily, pantoprazole 40 mg twice daily, QVAR 80 mcg 2 puffs 2 times a day, ursodiol 300 mg twice daily, Ensure 1 can by mouth 3 times a day between meals, Betimol 0.5% eye drop 1 drop in each eye at bedtime, gabapentin 600 mg at bedtime, pravastatin 20 mg daily, topiramate 25 mg at bedtime, nystatin swish and swallow 4 times a day, senna and allopurinol.    REVIEW OF SYSTEMS:  CONSTITUTIONAL:  No fever, but has had chills. A 3 to 4-pound weight loss in the last several days.  HEENT:  No blurry vision or double vision.  EARS, NOSE, THROAT:  No tinnitus or hearing loss.  RESPIRATORY:  Has history of COPD, and has some shortness of breath. No cough, wheezing, or painful respirations.  CARDIOVASCULAR:  Denies chest pain, orthopnea, or swelling in the legs.  GASTROINTESTINAL:  Nausea, vomiting, diarrhea as above. Denies abdominal pain. Has dark stools. Has history of diverticulosis and history of possible ischemic colitis in the past. Recent candidiasis. Has dysphagia, better than prior, but still persistent.  GENITOURINARY:  Denies dysuria, hematuria. HEMATOLOGIC/LYMPHATIC:  No anemia or easy bruising.  SKIN:  No rashes.  MUSCULOSKELETAL:  Has arthritis  and gout history.  NEUROLOGIC:  No numbness or weakness focally.  PSYCHIATRIC:  No anxiety or insomnia.   PHYSICAL EXAMINATION: VITAL SIGNS:  Temperature on arrival 97.7, pulse rate 109, respiratory rate 18, blood pressure 153/79, O2 sat 98% on room air.  GENERAL:  The patient is an elderly, African-American  female lying in bed, actively vomiting and dry heaving at times, while I was in the room, biliary  vomit.  HEENT: Normocephalic, atraumatic. Pupils are equal and reactive. Anicteric sclerae. Extraocular muscles intact. Dry mucous membranes.  NECK:  Supple. No thyroid tenderness. No cervical lymphadenopathy.  CARDIOVASCULAR:  S1, S2. Regular rate and rhythm. No murmurs, rubs or gallops.  LUNGS:  Clear to auscultation without wheezing, rhonchi or rales.  ABDOMEN: Soft, hyperactive. Some mild lower epigastric tenderness to deep palpation. No rebound or guarding.  EXTREMITIES:  No significant lower extremity edema.  SKIN:  No obvious rashes.  NEUROLOGIC:  Cranial nerves II through XII grossly intact. Strength is 5/5 in all extremities.  PSYCHIATRIC: Awake, alert, oriented x 3. Pleasant, cooperative.  LABS:  Glucose 107, BUN 8, creatinine 0.87, sodium 139, potassium 3.1, chloride 108, serum CO2 of 19. LFTs:  Albumin is 2.9 and alk phos is 231, otherwise within normal limits. WBC 10.9, platelets 374, hemoglobin 12.7. UA not suggestive of infection.  Imaging:  Non done so far.   ASSESSMENT AND PLAN:  We have a pleasant, 81 year old, African-American female with multiple comorbidities including history of suspected ischemic colitis in the past, diverticulosis, chronic obstructive pulmonary disease, anxiety, depression, primary biliary cirrhosis, as well as recent candidiasis, last month via EGD, who presents with nausea, vomiting and diarrhea, possibly viral gastroenteritis, as patient has had several other members who reside at [facility] with similar symptoms. Given the persistent symptoms and dehydration, as well as not improving with 2 doses of Zofran here, we would admit the patient for observation. Would obtain stool cultures comprehensively, and obtain a Gastroenterology consult for her severe symptomatic nausea and vomiting, as well as persistent dysphagia. She was supposed to follow with Dr. Gustavo Lah in a  couple of weeks. Would start her on Zofran, she has been given a couple of liters of IV fluid. I would also check a stool guaiac and hold the Plavix. She has no bright red blood per rectum, but describes her stool as dark. I would also check a stool C. diff  and hold nonessential medications, as patient has persistent vomiting and likely will not benefit from them. Would also start the patient on Diflucan IV, as patient has persistent dysphagia. I would start her on proton pump inhibitor IV b.i.d., replete the hypokalemia, start her on nebulizers p.r.n., and once her symptoms are better, would continue the Synthroid. I would hold the Lasix, as she has had significant gastrointestinal losses, and do not want to dehydrate her further.   The patient is FULL CODE.   Total time spent is 40 minutes.    ____________________________ Vivien Presto, MD sa:mr D: 01/10/2013 19:04:00 ET T: 01/10/2013 19:29:02 ET JOB#: [id number]  cc: Vivien Presto, MD, <Dictator> Vivien Presto MD ELECTRONICALLY SIGNED 01/19/2013 22:16

## 2014-12-08 NOTE — Consult Note (Signed)
   Present Illness 81 yo female with no apparent prior cardiac disease admitted with nausea, vomiting and developed afib with rapid ventricular response. Pt has a history of hypertension and hyperloipidemia but no apparetnet histgory of afib. She was given metoprolol and cardizem with some improvement in her heart rate. She denies chest pain or shortness of breath. Echo revealed preserved lv function with no signficant valvular disease. [name] has ruled out for an mi and has improved with hydration and antiemetics. SHe is currently hemodynamic. Her afib rate is still somewhat rapid but improved somewhat.   Physical Exam:  GEN well nourished   HEENT PERRL   NECK supple   RESP clear BS  rhonchi   CARD Irregular rate and rhythm  Tachycardic  Murmur   Murmur Systolic   Systolic Murmur axilla   ABD positive tenderness   LYMPH negative neck   EXTR negative cyanosis/clubbing   SKIN normal to palpation   NEURO cranial nerves intact, motor/sensory function intact   PSYCH A+O to time, place, person   Review of Systems:  Subjective/Chief Complaint nausea and vomiting   General: Fatigue   Skin: No Complaints   ENT: No Complaints   Eyes: No Complaints   Neck: No Complaints   Respiratory: No Complaints   Cardiovascular: Palpitations   Gastrointestinal: Nausea   Genitourinary: No Complaints   Vascular: No Complaints   Musculoskeletal: No Complaints   Neurologic: No Complaints   Hematologic: No Complaints   Endocrine: No Complaints   Psychiatric: No Complaints   Review of Systems: All other systems were reviewed and found to be negative   Medications/Allergies Reviewed Medications/Allergies reviewed   EKG:  Interpretation afib with rapid ventricular response. No ischemia    Ace Inhibitors: Cough  Septra: N/V/Diarrhea   Impression Pt admitted with nausea and vomiting and developed afib with rvr. This appears to be seodnary to the nausea and vomiting. Pt has no  prior hisotyr of afib. Her rate is improved with cardizem. Not a candidate at present for chronic anticoagulaiton until gi issues are resolved.   Plan 1. Rate control with cardizem 2. Defer anticoagulation until gi system is stable 3. Hydration   Electronic Signatures: Teodoro Spray (MD)  (Signed 30-May-14 15:45)  Authored: General Aspect/Present Illness, History and Physical Exam, Review of System, EKG , Allergies, Impression/Plan   Last Updated: 30-May-14 15:45 by Teodoro Spray (MD)

## 2014-12-10 NOTE — Consult Note (Signed)
Chief Complaint:   Subjective/Chief Complaint patient states she is feeling better, one diarrhea stool today.  mild abdominal discomfort after eating.   VITAL SIGNS/ANCILLARY NOTES: **Vital Signs.:   08-Jan-13 14:51   Vital Signs Type Routine   Temperature Temperature (F) 98.5   Celsius 36.9   Temperature Source oral   Pulse Pulse 81   Pulse source per Dinamap   Respirations Respirations 20   Systolic BP Systolic BP 97   Diastolic BP (mmHg) Diastolic BP (mmHg) 63   Mean BP 74   BP Source Dinamap   Pulse Ox % Pulse Ox % 96   Pulse Ox Activity Level  At rest   Oxygen Delivery Room Air/ 21 %   Brief Assessment:   Cardiac Regular    Respiratory clear BS    Gastrointestinal details normal Soft  Nontender  Nondistended  No masses palpable  Bowel sounds normal   Assessment/Plan:  Assessment/Plan:   Assessment 1)  abdominal pain and diarrhea.  colonoscopy c/w mild left colon ischemia.  review of CT with radiology shows no high grade blockage of the mesenterics, but some evidence of plaque at the origin of the mesenteric takeoffs.    Plan 1) continue current.  should patien have increased sx, or not better resolution of current sx, would consider vascular consideration of mesenteric angio.  Plans for rehab noted.   Electronic Signatures: Loistine Simas (MD)  (Signed 08-Jan-13 17:52)  Authored: Chief Complaint, VITAL SIGNS/ANCILLARY NOTES, Brief Assessment, Assessment/Plan   Last Updated: 08-Jan-13 17:52 by Loistine Simas (MD)

## 2014-12-10 NOTE — Discharge Summary (Signed)
PATIENT NAME:  ELLEANNA, ARRISON MR#:  [id number] DATE OF BIRTH:  1927/09/03  DATE OF ADMISSION:  08/17/2011 DATE OF DISCHARGE:  08/27/2011  ADDENDUM:  Her Accu-Chek at discharge was around 65. A serum  glucose was done, and it was 89. The patient is asymptomatic. Her Accu-Cheks are unreliable because of her Raynaud's phenomenon. Her serum glucoses are more reliable, and her low Accu-Cheks should always be confirmed with serum glucose. Serum glucose is stable around 89. Her serum glucose has always been stable in the range of 80s. The patient is not hypoglycemic.   ____________________________ Mena Pauls, MD ag:cbb D: 08/27/2011 17:55:20 ET T: 08/27/2011 18:04:38 ET JOB#: [id number]  cc: Mena Pauls, MD, <Dictator> [place] MD ELECTRONICALLY SIGNED 09/12/2011 10:21

## 2014-12-10 NOTE — Discharge Summary (Signed)
PATIENT NAME:  [name], [name] MR#:  [id number] DATE OF BIRTH:  August 07, 1928  DATE OF ADMISSION:  08/17/2011 DATE OF DISCHARGE:  08/27/2011  Please refer to previous interim discharge summary by Dr. Deanne Coffer on 08/22/2011.  DISCHARGE DIAGNOSES:  1. Chest pain, noncardiac, myocardial infarction and pulmonary embolus ruled out. 2. Abdominal pain, nausea, and vomiting status post colonoscopy, suspect ischemic colitis. 3. Failure to thrive. 4. Persistent hypoglycemia. 5. Hypertension. 6. Hyperlipidemia. 7. Gout. 8. Gastroesophageal reflux disease.  9. History of primary biliary cirrhosis. 10. Anemia. 11. History of renal cell cancer. 12. Anxiety and depression. 13. History of Raynaud's phenomenon. 14. Peripheral vascular disease.   CONSULTANTS:  1. Lujean Amel, MD - Cardiology. 2. Loistine Simas, MD - Gastroenterology.   HOSPITAL COURSE: This is an 81 year old female with multiple medical problems with history of renal cell cancer, anxiety, depression, hyperlipidemia, chronic obstructive pulmonary disease, gastroesophageal reflux disease, hypertension, peripheral vascular disease, and Raynaud's phenomenon. She presented with failure to thrive and malnutrition, progressive weakness, intractable diarrhea, and some chest pain. She was admitted with chest pain, diarrhea, nausea, and vomiting. She had work-up done for chest pain. She was ruled out for myocardial infarction. The chest pain was deemed to be noncardiac. She was also evaluated by cardiology. Her chest x-ray when she came in was negative. She had a CT of the chest, abdomen and pelvis done. CT of the chest did not show any evidence of PE. On the CT of the abdomen there was some bowel wall thickening involving the descending colon, sigmoid colon, and rectum concerning for some colitis.  She had an echocardiogram done which was essentially normal. On her echocardiogram, the LV was not well-visualized, but the LV was grossly normal  in size, no thrombus, ejection fraction of greater than 55%, normal left ventricular wall thickness, right ventricle mild to moderately dilated, moderate to severe TR, and right ventricular pressure of 30 to 40 mmHg. No further cardiac work-up was needed, as per cardiology. Because of abdominal pain, nausea and vomiting Gastroenterology was consulted. She had a previous EGD done and she had dilatation done and her dysphagia resolved with dilatation. He suggested to do a colonoscopy on her because of questionable colitis on the CT scan. Her white count was essentially normal. The patient was taken for colonoscopy on 08/22/2011 and colonoscopy showed diverticulosis in the sigmoid colon, descending colon and transverse colon. Biopsies were taken. There was no evidence of colitis. Her biopsy results show that she had focal mild active colitis, negative for malignancy, and focal changes suggestive of ischemia. So the patient has some evidence of ischemic colitis. She also has some postprandial abdominal pain, which has improved. Gastroenterology reviewed the CT scan with vascular and there was no significant mesenteric ischemia, no high-grade blockage present, but some plaque in the origin of the mesenteric take-off.  Her symptoms are improving right now. Her abdominal pain, nausea, and vomiting have improved. She is occasionally nauseous. She has very poor appetite. An appetite stimulant has been started. She has been given Ensure and also she should be given multiple snacks. She was also persistently hypoglycemic requiring D10 and D5 drip. Also she had some pseudohypoglycemia because of Raynaud's phenomenon. Her Accu-Cheks on cold fingers are usually hypoglycemic. They need to be warmed before we do the Accu-Cheks on her. Her serum glucose was always normal. Hypoglycemia should always be confirmed by serum glucose on her. She is off D5 drip for 24 hours. Her sugars have been stable. His appetite is  better. She is  tolerating some Ensure and diet. She had AFPs done that were normal at 5.4 and at discharge her platelet count is stable at 267 and MET-B is normal. Her glucose is 84, sodium 139, and creatinine is 1.14 with BUN of 13. I have given her an appointment to followup with Dr. Gustavo Lah as an outpatient.   DISCHARGE MEDICATIONS:  Home Medications: 1. Singulair 10 mg daily. 2. Ursodiol 300 mg twice a day. 3. Acetaminophen/hydrocodone 5/325 mg three times daily as needed for pain. 4. Albuterol 2 puffs every 4 hours p.r.n.  5. Tessalon Perles 100 mg three times daily p.r.n. for cough. 6. Plavix 75 mg daily. 7. Vitamin D3 1000 units twice daily. 8. Potassium chloride 10 milliequivalents daily. 9. Lasix 20 mg daily.  10. Metoprolol succinate 25 mg half tablet twice a day.  11. Colcrys 0.6 mg twice a day. 12. Protonix 40 mg twice a day. 13. Topamax 25 mg at bedtime. 14. Timolol one drop to each affected eye once a day.  15. Pravastatin 20 mg daily.  16. Tussin every 6 hours as needed. 17. Saline mist two nasal sprays every two hours as needed. 18. Tylenol 500 mg extra-strength three times daily p.r.n.   New Medications:  1. Imodium 2 mg p.o. every 6 hours as needed for diarrhea, maximum of 8 mg a day. 2. Zofran 4 mg p.o. every 6 hours p.r.n. nausea. 3. Megace 400 mg/10 mL, 400 mg p.o. twice a day. 4. Ensure 240 mL p.o. twice a day.   CONDITION AT DISCHARGE: She has wasted appearance, but no acute complaints right now. T-max is 98.4, heart rate 89, blood pressure 125/73, and saturating 96% on room air. Chest is clear. Heart sound are regular. Abdomen is soft and nontender. She has a wasted, cachectic appearance. She has failure to thrive.   DIET: She is on a mechanical and GI soft diet.  Please provide snacks to her.   DISCHARGE FOLLOWUP: Followup with MD at short-term rehab, followup with Dr. Reymundo Poll in 1 weeks, followup BMP in one week, AND followup with Dr. Gustavo Lah with [facility] in 2 to 3 weeks.   TIME SPENT ON DISCHARGE: 50 minutes.  ____________________________ Mena Pauls, MD ag:slb D: 08/27/2011 13:12:05 ET     T: 08/27/2011 13:47:26 ET       JOB#: [id number] cc: Reymundo Poll, MD Loistine Simas, MD Mena Pauls MD ELECTRONICALLY SIGNED 09/12/2011 10:21

## 2014-12-10 NOTE — Consult Note (Signed)
Chief Complaint:   Subjective/Chief Complaint Pt still not feel really well today. Diarrhea better but she still has abd pain   VITAL SIGNS/ANCILLARY NOTES: **Vital Signs.:   01-Jan-13 15:53   Vital Signs Type Routine   Temperature Temperature (F) 97.2   Celsius 36.2   Temperature Source axillary   Pulse Pulse 60   Pulse source per Dinamap   Respirations Respirations 18   Systolic BP Systolic BP 453   Diastolic BP (mmHg) Diastolic BP (mmHg) 67   Mean BP 87   BP Source Dinamap   Pulse Ox Activity Level  At rest   Oxygen Delivery 3L  *Intake and Output.:   Shift 01-Jan-13 15:00   Grand Totals Intake:  700 Output:      Net:  700 24 Hr.:  700   Oral Intake      In:  700   Length of Stay Totals Intake:  4670 Output:  6468    Net:  0321   Brief Assessment:   Cardiac Regular  murmur present  -- carotid bruits  -- LE edema  -- JVD  --Gallop    Respiratory normal resp effort  clear BS    Gastrointestinal details normal Nondistended  No masses palpable  Bowel sounds normal  No rebound tenderness  No gaurding    Gastrointestinal details abnormal Minimum Tenderness    Tenderness periumbilical   Cardiac:  22-QMG-50 03:24    CK, Total 56   CPK-MB, Serum 0.7  Routine Chem:  31-Dec-12 03:24    Glucose, Serum 84   BUN 14   Creatinine (comp) 1.10   Sodium, Serum 140   Potassium, Serum 3.8   Chloride, Serum 103   CO2, Serum 16   Calcium (Total), Serum 8.6  Hepatic:  31-Dec-12 03:24    Bilirubin, Total 0.5   Alkaline Phosphatase 135   SGPT (ALT) 14   SGOT (AST) 32   Total Protein, Serum 6.8   Albumin, Serum 3.2  Routine Chem:  31-Dec-12 03:24    Osmolality (calc) 279   eGFR (African American) >60   eGFR (Non-African American) 51   Anion Gap 21  Routine Hem:  31-Dec-12 03:24    WBC (CBC) 5.6   RBC (CBC) 3.98   Hemoglobin (CBC) 13.0   Hematocrit (CBC) 38.9   Platelet Count (CBC) 203   MCV 98   MCH 32.6   MCHC 33.4   RDW 15.1  Cardiac:  31-Dec-12 03:24     Troponin I < 0.02  Routine Hem:  31-Dec-12 03:24    Neutrophil % 55.0   Lymphocyte % 33.4   Monocyte % 10.6   Eosinophil % 0.7   Basophil % 0.3   Neutrophil # 3.1   Lymphocyte # 1.9   Monocyte # 0.6   Eosinophil # 0.0   Basophil # 0.0  Lab:  31-Dec-12 04:00    O2 Saturation (Pulse Ox) 94   FiO2 (Pulse Ox) ra  Cardiac:  31-Dec-12 10:56    CK, Total 36   CPK-MB, Serum < 0.5   Troponin I < 0.02  Thyroid:  31-Dec-12 17:34    Thyroid Stimulating Hormone 2.79   Radiology Results: XRay:    30-Dec-12 12:19, Chest Portable Single View   Chest Portable Single View    REASON FOR EXAM:    cp  COMMENTS:       PROCEDURE: DXR - DXR PORTABLE CHEST SINGLE VIEW  - Aug 17 2011 12:19PM     RESULT: Comparison: None  Findings:     Single portable AP chest radiograph is provided.  There is no focal   parenchymal opacity, pleural effusion, or pneumothorax. Normal   cardiomediastinal silhouette. The osseous structures are unremarkable.    IMPRESSION:     No acute disease of the chest.          Verified By: Jennette Banker, M.D., MD  Lab:    31-Dec-12 04:00, Pulse Oximetry   O2 Saturation (Pulse Ox) 94   FiO2 (Pulse Ox) ra   Result(s) reported on 18 Aug 2011 at 04:58AM.  Cardiology:    30-Dec-12 10:41, ED ECG   Ventricular Rate 71   Atrial Rate 70   QRS Duration 62   QT 394   QTc 428   R Axis 33   T Axis -37   ECG interpretation    Accelerated Junctional rhythm  Low voltage QRS  Septal infarct (cited on or before 14-Apr-2011)  ST & T wave abnormality, consider inferior ischemia  Abnormal ECG  When compared with ECG of 14-Apr-2011 12:11,  Junctional rhythm has replaced Sinus rhythm  ST now depressed in Anterior leads  T wave inversion now evident in Inferior leads  T wave inversion now evident in Lateral leads  ----------unconfirmed----------  Confirmed by OVERREAD, NOT ([id number]), editor PEARSON, BARBARA ([id number]) on 08/18/2011 10:51:15 AM   ED ECG     01-Jan-13 09:33,  Echo Doppler   Echo Doppler    Interpretation Summary    The left ventricle is not well visualized. The left ventricle is   grossly normal size. There is no thrombus. Left ventricular systolic   function is normal. Ejection Fraction = >55%. There is normal left   ventricular wall thickness. The left ventricular wall motion is   normal. The right ventricle is mild to moderately dilated. The right   ventricular systolic function is normal. There is moderate to severe   tricuspid regurgitation. Right ventricular systolic pressure is   elevated at 30-60mmHg.    PatientHeight: 175 cm    PatientWeight: 66 kg    BSA: 1.8 m2    Procedure:    A two-dimensional transthoracic echocardiogram with color flow and   Doppler was performed.    The study was completed  bedside.    Left Ventricle    There is normal left ventricular wall thickness.    Left ventricular systolic function is normal.    Ejection Fraction = >55%.    The left ventricle is not well visualized.    The left ventricle is grossly normal size.    There is no thrombus.    The left ventricular wall motion is normal.    Right Ventricle    The right ventricle is mild to moderately dilated.    There is normal right ventricular wall thickness.    The right ventricular systolic function is normal.    Atria    The leftatrial size is normal.    Right atrial size is normal.    Mitral Valve    The mitral valve leaflets appear thickened, but open well.    There is mild to moderate mitral regurgitation.    Tricuspid Valve    Right ventricular systolic pressure is elevated at 30-32mmHg.    The tricuspid valve is not well visualized, but is grossly normal.    There is moderate to severe tricuspid regurgitation.    Aortic Valve    The aortic valve is normal in structure and function.    No aortic  regurgitation is present.    Pulmonic Valve    The pulmonic valve is not well seen, but is grossly normal.    There is no pulmonic  valvular regurgitation.    Great Vessels    The aortic root is not well visualized but is probably normal size.    Pericardium/Pleural    No pericardial effusion.    There is no pleural effusion.    MMode 2D Measurements and Calculations    RVDd: 3.0 cm    IVSd: 1.2 cm    LVIDd: 3.6 cm    LVIDs: 2.3 cm    LVPWd: 1.1 cm    FS: 37 %    EF(Teich): 68 %    Ao root diam: 3.8 cm    ACS: 2.1 cm   LA dimension: 2.9 cm    LVOT diam: 2.3 cm    Doppler Measurements and Calculations    MV E point: 67 cm/sec    MV A point: 86 cm/sec    MV E/A: 0.78     MV P1/2t max vel: 68 cm/sec    MV P1/2t: 73 msec    MVA(P1/2t): 3.0 cm2    MV dec slope: 272 cm/sec2    MV dec time: 0.24 sec    Ao V2 max: 118 cm/sec    Ao max PG: 5.5 mmHg    Ao V2 mean: 67 cm/sec    Ao mean PG: 2.1 mmHg    Ao V2 VTI: 21 cm    AVA(I,D): 3.9 cm2    AVA(V,D): 3.2 cm2    AI max vel: 222 cm/sec    AI max PG: 20 mmHg    AI dec slope: 96 cm/sec2    AI P1/2t: 675 msec    LV max PG: 3.0 mmHg    LV mean PG: 1.1 mmHg    LV V1 max: 89 cm/sec    LV V1 mean: 45 cm/sec    LV V1 VTI: 20 cm    MR max vel: 394 cm/sec    MR max PG: 62 mmHg    SV(LVOT): 81 ml    PA V2 max: 62 cm/sec    PA max PG: 1.5 mmHg    PA acc slope: 534 cm/sec2    PA acc time: 0.12 sec    TR Max vel: 273 cm/sec    TR Max PG: 29 mmHg    RVSP: 39 mmHg    RAP systole: 10 mmHg    PA pr(Accel): 27 mmHg    Reading Physician: Lujean Amel   Sonographer: Jaci Standard  Interpreting Physician:  Lujean Amel,  electronically signed on   08-19-2011 13:56:05  Requesting Physician: Lujean Amel  CT:    31-Dec-12 16:36, CT Chest, Abd, and Pelvis With Contrast   CT Chest, Abd, and Pelvis With Contrast    REASON FOR EXAM:    (1) chest pain, ?PE vs pneumonia; (2) abdominal pain,   diarrhea, N/V  COMMENTS:       PROCEDURE: CT  - CT CHEST ABDOMEN AND PELVIS W  - Aug 18 2011  4:36PM     RESULT: CT CHEST, ABDOMEN, AND PELVIS    History: Chest  pain, abdominal pain    Comparison: None    Technique:  A thin-section spiral CT from the lung apices to the upper   abdomen was acquired on a multi slice scanner following 111ml Isovue-370   intravenous contrast. These images were then transferred to the Siemens     work station  and were subsequently reviewed utilizing 3-D reconstructions   and MIP images. Multiple axial images were obtained of the abdomen and   pelvis, with p.o. contrast and with 100 ml of Isovue-370 intravenous   contrast.     Findings:     CHEST:    There is adequate opacification of the pulmonary arteries. There is no   pulmonary embolus. The main pulmonary artery, right main pulmonary   artery, and left main pulmonary arteries are normal in size. The heart   size is normal. There is no pericardial effusion. There is coronary   artery atherosclerosis.    The lungs are clear. There is no focal consolidation, pleural effusion,   or pneumothorax.     There is no axillary, hilar, or mediastinal adenopathy.    The osseous structures are unremarkable.    ABDOMEN/PELVIS:    The liver demonstrates no focal abnormality. There is no intrahepatic or   extrahepatic biliary ductal dilatation. The gallbladder is unremarkable.   The spleen demonstrates no focal abnormality. The kidneys, adrenal   glands, pancreas are normal. The bladder is unremarkable.     The stomach, duodenum, small intestine, and large intestine demonstrate     no contrast extravasation or dilatation. There is bowel wall thickening   involving the descending colon, sigmoid colon and rectum most concerning   for colitis which may be secondary to an infectious, inflammatory or   ischemic etiology. There is no pneumoperitoneum, pneumatosis, or portal   venous gas. There is no abdominal or pelvic free fluid. There isno   lymphadenopathy.     The abdominal aorta is normal in caliber with atherosclerosis.    There is lumbar spine  spondylosis.    IMPRESSION:     1. No CT evidence of pulmonary embolus.     2. There is bowel wall thickening involving the descending colon, sigmoid   colon and rectum most concerning for colitis which may be secondary to an   infectious, inflammatory or ischemic etiology.          Verified By: Jennette Banker, M.D., MD   Assessment/Plan:  Invasive Device Daily Assessment of Necessity:   Does the patient currently have any of the following indwelling devices? none   Assessment/Plan:   Assessment IMP CP Abdominal pain Gastroenteritis Diarrhea Colitis Dehydration Weight loss    Plan PLAN Hydration Antibx for possible colitis Rec scope Doubt MI or angina Protonix Keep NPO for now Agree with GI input   Electronic Signatures: Yolonda Kida (MD)  (Signed 02-Jan-13 22:29)  Authored: Chief Complaint, VITAL SIGNS/ANCILLARY NOTES, Brief Assessment, Lab Results, Radiology Results, Assessment/Plan   Last Updated: 02-Jan-13 22:29 by Lujean Amel D (MD)

## 2014-12-10 NOTE — Consult Note (Signed)
Chief Complaint:   Subjective/Chief Complaint colonoscopy showing diverticulosis.  No overt colitis.  Multiple random biopsies taken.  No new recs.  Dr Dionne Milo to see over the weekend.   Electronic Signatures for Addendum Section:  Loistine Simas (MD) (Signed Addendum 04-Jan-13 15:26)  Dr Dionne Milo will be covering over the weekend, call if needed.  I will be back monday.  Discussed with Dr Royden Purl.   Electronic Signatures: Loistine Simas (MD)  (Signed 04-Jan-13 13:11)  Authored: Chief Complaint   Last Updated: 04-Jan-13 15:26 by Loistine Simas (MD)

## 2014-12-10 NOTE — Consult Note (Signed)
Chief Complaint:   Subjective/Chief Complaint still with loose stools. mild lower abdominal discomfort.  no n/v, tolerating full liquids.   VITAL SIGNS/ANCILLARY NOTES: **Vital Signs.:   03-Jan-13 09:05   Vital Signs Type Pre Medication   Temperature Temperature (F) 97.5   Celsius 36.3   Temperature Source oral   Pulse Pulse 77   Pulse source per Dinamap   Respirations Respirations 20   Systolic BP Systolic BP 150   Diastolic BP (mmHg) Diastolic BP (mmHg) 69   Mean BP 96   BP Source Dinamap   Pulse Ox % Pulse Ox % 100   Oxygen Delivery 4L  *Intake and Output.:   03-Jan-13 10:31   Stool  very watery stool, large amount    10:46   Stool  very watery stool, small amount   Brief Assessment:   Cardiac Regular    Respiratory clear BS    Gastrointestinal details normal Soft  Nondistended  No masses palpable  Bowel sounds normal  mild lower abdominal discomfort to palpation   Routine Chem:  03-Jan-13 06:03    Glucose, Serum 90   BUN 1   Creatinine (comp) 1.04   Sodium, Serum 144   Potassium, Serum 3.7   Chloride, Serum 113   CO2, Serum 21   Calcium (Total), Serum 8.3   Osmolality (calc) 282   eGFR (African American) >60   eGFR (Non-African American) 54   Anion Gap 10  Routine Hem:  03-Jan-13 06:03    WBC (CBC) 5.3   RBC (CBC) 3.37   Hemoglobin (CBC) 10.9   Hematocrit (CBC) 32.8   Platelet Count (CBC) 151   MCV 97   MCH 32.3   MCHC 33.1   RDW 15.1   Neutrophil % 52.0   Lymphocyte % 37.4   Monocyte % 8.8   Eosinophil % 1.5   Basophil % 0.3   Neutrophil # 2.8   Lymphocyte # 2.0   Monocyte # 0.5   Eosinophil # 0.1   Basophil # 0.0   Radiology Results: Cardiology:    30-Dec-12 10:41, ED ECG   ECG interpretation    Accelerated Junctional rhythm  Low voltage QRS  Septal infarct (cited on or before 14-Apr-2011)  ST & T wave abnormality, consider inferior ischemia  Abnormal ECG  When compared with ECG of 14-Apr-2011 12:11,  Junctional rhythm has replaced  Sinus rhythm  ST now depressed in Anterior leads  T wave inversion now evident in Inferior leads  T wave inversion now evident in Lateral leads  ----------unconfirmed----------  Confirmed by OVERREAD, NOT ([id number]), editor PEARSON, BARBARA ([id number]) on 08/18/2011 10:51:15 AM    01-Jan-13 09:33, Echo Doppler   Echo Doppler    Interpretation Summary    The left ventricle is not well visualized. The left ventricle is   grossly normal size. There is no thrombus. Left ventricular systolic   function is normal. Ejection Fraction = >55%. There is normal left   ventricular wall thickness. The left ventricular wall motion is   normal. The right ventricle is mild to moderately dilated. The right   ventricular systolic function is normal. There is moderate to severe   tricuspid regurgitation. Right ventricular systolic pressure is   elevated at 30-58mmHg.    PatientHeight: 175 cm    PatientWeight: 66 kg    BSA: 1.8 m2    Procedure:    A two-dimensional transthoracic echocardiogram with color flow and   Doppler was performed.    The study was completed  bedside.    Left Ventricle    There is normal left ventricular wall thickness.    Left ventricular systolic function is normal.    Ejection Fraction = >55%.    The left ventricle is not well visualized.    The left ventricle is grossly normal size.    There is no thrombus.    The left ventricular wall motion is normal.    Right Ventricle    The right ventricle is mild to moderately dilated.    There is normal right ventricular wall thickness.    The right ventricular systolic function is normal.    Atria    The leftatrial size is normal.    Right atrial size is normal.    Mitral Valve    The mitral valve leaflets appear thickened, but open well.    There is mild to moderate mitral regurgitation.    Tricuspid Valve    Right ventricular systolic pressure is elevated at 30-70mmHg.    The tricuspid valve is not well visualized, but is grossly  normal.    There is moderate to severe tricuspid regurgitation.    Aortic Valve    The aortic valve is normal in structure and function.    No aortic regurgitation is present.    Pulmonic Valve    The pulmonic valve is not well seen, but is grossly normal.    There is no pulmonic valvular regurgitation.    Great Vessels    The aortic root is not well visualized but is probably normal size.    Pericardium/Pleural    No pericardial effusion.    There is no pleural effusion.    MMode 2D Measurements and Calculations    RVDd: 3.0 cm    IVSd: 1.2 cm    LVIDd: 3.6 cm    LVIDs: 2.3 cm    LVPWd: 1.1 cm    FS: 37 %    EF(Teich): 68 %    Ao root diam: 3.8 cm    ACS: 2.1 cm   LA dimension: 2.9 cm    LVOT diam: 2.3 cm    Doppler Measurements and Calculations    MV E point: 67 cm/sec    MV A point: 86 cm/sec    MV E/A: 0.78     MV P1/2t max vel: 68 cm/sec    MV P1/2t: 73 msec    MVA(P1/2t): 3.0 cm2    MV dec slope: 272 cm/sec2    MV dec time: 0.24 sec    Ao V2 max: 118 cm/sec    Ao max PG: 5.5 mmHg    Ao V2 mean: 67 cm/sec    Ao mean PG: 2.1 mmHg    Ao V2 VTI: 21 cm    AVA(I,D): 3.9 cm2    AVA(V,D): 3.2 cm2    AI max vel: 222 cm/sec    AI max PG: 20 mmHg    AI dec slope: 96 cm/sec2    AI P1/2t: 675 msec    LV max PG: 3.0 mmHg    LV mean PG: 1.1 mmHg    LV V1 max: 89 cm/sec    LV V1 mean: 45 cm/sec    LV V1 VTI: 20 cm    MR max vel: 394 cm/sec    MR max PG: 62 mmHg    SV(LVOT): 81 ml    PA V2 max: 62 cm/sec    PA max PG: 1.5 mmHg    PA acc slope: 534 cm/sec2  PA acc time: 0.12 sec    TR Max vel: 273 cm/sec    TR Max PG: 29 mmHg    RVSP: 39 mmHg    RAP systole: 10 mmHg    PA pr(Accel): 27 mmHg    Reading Physician: Dorothyann Peng   Sonographer: Andi Hence  Interpreting Physician:  Dorothyann Peng,  electronically signed on   08-19-2011 13:56:05  Requesting Physician: Dorothyann Peng  CT:    31-Dec-12 16:36, CT Chest, Abd, and Pelvis With Contrast    CT Chest, Abd, and Pelvis With Contrast    REASON FOR EXAM:    (1) chest pain, ?PE vs pneumonia; (2) abdominal pain,   diarrhea, N/V  COMMENTS:       PROCEDURE: CT  - CT CHEST ABDOMEN AND PELVIS W  - Aug 18 2011  4:36PM     RESULT: CT CHEST, ABDOMEN, AND PELVIS    History: Chest pain, abdominal pain    Comparison: None    Technique:  A thin-section spiral CT from the lung apices to the upper   abdomen was acquired on a multi slice scanner following Isovue-370   intravenous contrast. These images were then transferred to the Siemens     work station and were subsequently reviewed utilizing 3-D reconstructions   and MIP images. Multiple axial images were obtained of the abdomen and   pelvis, with p.o. contrast and with 100 ml of Isovue-370 intravenous   contrast.     Findings:     CHEST:    There is adequate opacification of the pulmonary arteries. There is no   pulmonary embolus. The main pulmonary artery, right main pulmonary   artery, and left main pulmonary arteries are normal in size. The heart   size is normal. There is no pericardial effusion. There is coronary   artery atherosclerosis.    The lungs are clear. There is no focal consolidation, pleural effusion,   or pneumothorax.     There is no axillary, hilar, or mediastinal adenopathy.    The osseous structures are unremarkable.    ABDOMEN/PELVIS:    The liver demonstrates no focal abnormality. There is no intrahepatic or   extrahepatic biliary ductal dilatation. The gallbladder is unremarkable.   The spleen demonstrates no focal abnormality. The kidneys, adrenal   glands, pancreas are normal. The bladder is unremarkable.     The stomach, duodenum, small intestine, and large intestine demonstrate     no contrast extravasation or dilatation. There is bowel wall thickening   involving the descending colon, sigmoid colon and rectum most concerning   for colitis which may be secondary to an infectious,  inflammatory or   ischemic etiology. There is no pneumoperitoneum, pneumatosis, or portal   venous gas. There is no abdominal or pelvic free fluid. There isno   lymphadenopathy.     The abdominal aorta is normal in caliber with atherosclerosis.    There is lumbar spine spondylosis.    IMPRESSION:     1. No CT evidence of pulmonary embolus.     2. There is bowel wall thickening involving the descending colon, sigmoid   colon and rectum most concerning for colitis which may be secondary to an   infectious, inflammatory or ischemic etiology.          Verified By: Joellyn Haff, M.D., MD   Assessment/Plan:  Assessment/Plan:   Assessment 1) weight loss, abnormal CT abd , diarrhea. was on plavix yesterday, had to hold for proceedure.  Plan 1) colonoscopy tomorrow, will give one bottle mg citrate tonight.  I have discussed the risks benefits and complications of egd to include not limited to bleeding infection perforation and sedation andshe wishes to procees.  Will need anesthesia assistance.   Electronic Signatures: Loistine Simas (MD)  (Signed 03-Jan-13 12:52)  Authored: Chief Complaint, VITAL SIGNS/ANCILLARY NOTES, Brief Assessment, Lab Results, Radiology Results, Assessment/Plan   Last Updated: 03-Jan-13 12:52 by Loistine Simas (MD)

## 2014-12-10 NOTE — Consult Note (Signed)
Chief Complaint:   Subjective/Chief Complaint Minor improvement so far no f/c/s. Still has some diarrhea. No cp.   VITAL SIGNS/ANCILLARY NOTES: **Vital Signs.:   02-Jan-13 13:57   Vital Signs Type Routine   Temperature Temperature (F) 98   Celsius 36.6   Temperature Source oral   Pulse Pulse 61   Pulse source per Dinamap   Respirations Respirations 20   Systolic BP Systolic BP 623   Diastolic BP (mmHg) Diastolic BP (mmHg) 68   Mean BP 96   BP Source Dinamap   Oxygen Delivery 4L  *Intake and Output.:   02-Jan-13 12:09   Grand Totals Intake:   Output:  300    Net:  -300 24 Hr.:  -500   Urine ml     Out:  300   Urinary Method  Foley   Stool  small watery stool, almost clear   Brief Assessment:   Cardiac Regular  murmur present  -- carotid bruits  -- LE edema  -- JVD  --Gallop    Respiratory normal resp effort  clear BS    Gastrointestinal details normal Nondistended  No masses palpable  Bowel sounds normal  No rebound tenderness  No gaurding    Gastrointestinal details abnormal Minimum Tenderness    Tenderness periumbilical   Routine Chem:  02-Jan-13 06:57    Glucose, Serum 97   BUN 3   Creatinine (comp) 0.99   Sodium, Serum 142   Potassium, Serum 3.6   Chloride, Serum 110   CO2, Serum 18   Calcium (Total), Serum 9.0   Osmolality (calc) 280   eGFR (African American) >60   eGFR (Non-African American) 57   Anion Gap 14  Routine Hem:  02-Jan-13 06:57    WBC (CBC) 6.1   RBC (CBC) 3.79   Hemoglobin (CBC) 12.3   Hematocrit (CBC) 37.3   Platelet Count (CBC) 180   MCV 99   MCH 32.4   MCHC 32.9   RDW 15.1   Neutrophil % 59.5   Lymphocyte % 31.1   Monocyte % 7.2   Eosinophil % 1.9   Basophil % 0.3   Neutrophil # 3.6   Lymphocyte # 1.9   Monocyte # 0.4   Eosinophil # 0.1   Basophil # 0.0   Radiology Results: XRay:    30-Dec-12 12:19, Chest Portable Single View   Chest Portable Single View    REASON FOR EXAM:    cp  COMMENTS:       PROCEDURE: DXR  - DXR PORTABLE CHEST SINGLE VIEW  - Aug 17 2011 12:19PM     RESULT: Comparison: None    Findings:     Single portable AP chest radiograph is provided.  There is no focal   parenchymal opacity, pleural effusion, or pneumothorax. Normal   cardiomediastinal silhouette. The osseous structures are unremarkable.    IMPRESSION:     No acute disease of the chest.          Verified By: Jennette Banker, M.D., MD  Lab:    31-Dec-12 04:00, Pulse Oximetry   O2 Saturation (Pulse Ox) 94   FiO2 (Pulse Ox) ra   Result(s) reported on 18 Aug 2011 at 04:58AM.  Cardiology:    30-Dec-12 10:41, ED ECG   Ventricular Rate 71   Atrial Rate 70   QRS Duration 62   QT 394   QTc 428   R Axis 33   T Axis -37   ECG interpretation    Accelerated Junctional  rhythm  Low voltage QRS  Septal infarct (cited on or before 14-Apr-2011)  ST & T wave abnormality, consider inferior ischemia  Abnormal ECG  When compared with ECG of 14-Apr-2011 12:11,  Junctional rhythm has replaced Sinus rhythm  ST now depressed in Anterior leads  T wave inversion now evident in Inferior leads  T wave inversion now evident in Lateral leads  ----------unconfirmed----------  Confirmed by OVERREAD, NOT ([id number]), editor PEARSON, BARBARA ([id number]) on 08/18/2011 10:51:15 AM   ED ECG     01-Jan-13 09:33, Echo Doppler   Echo Doppler    Interpretation Summary    The left ventricle is not well visualized. The left ventricle is   grossly normal size. There is no thrombus. Left ventricular systolic   function is normal. Ejection Fraction = >55%. There is normal left   ventricular wall thickness. The left ventricular wall motion is   normal. The right ventricle is mild to moderately dilated. The right   ventricular systolic function is normal. There is moderate to severe   tricuspid regurgitation. Right ventricular systolic pressure is   elevated at 30-49mmHg.    PatientHeight: 175 cm    PatientWeight: 66 kg    BSA: 1.8  m2    Procedure:    A two-dimensional transthoracic echocardiogram with color flow and   Doppler was performed.    The study was completed  bedside.    Left Ventricle    There is normal left ventricular wall thickness.    Left ventricular systolic function is normal.    Ejection Fraction = >55%.    The left ventricle is not well visualized.    The left ventricle is grossly normal size.    There is no thrombus.    The left ventricular wall motion is normal.    Right Ventricle    The right ventricle is mild to moderately dilated.    There is normal right ventricular wall thickness.    The right ventricular systolic function is normal.    Atria    The leftatrial size is normal.    Right atrial size is normal.    Mitral Valve    The mitral valve leaflets appear thickened, but open well.    There is mild to moderate mitral regurgitation.    Tricuspid Valve    Right ventricular systolic pressure is elevated at 30-63mmHg.    The tricuspid valve is not well visualized, but is grossly normal.    There is moderate to severe tricuspid regurgitation.    Aortic Valve    The aortic valve is normal in structure and function.    No aortic regurgitation is present.    Pulmonic Valve    The pulmonic valve is not well seen, but is grossly normal.    There is no pulmonic valvular regurgitation.    Great Vessels    The aortic root is not well visualized but is probably normal size.    Pericardium/Pleural    No pericardial effusion.    There is no pleural effusion.    MMode 2D Measurements and Calculations    RVDd: 3.0 cm    IVSd: 1.2 cm    LVIDd: 3.6 cm    LVIDs: 2.3 cm    LVPWd: 1.1 cm    FS: 37 %    EF(Teich): 68 %    Ao root diam: 3.8 cm    ACS: 2.1 cm   LA dimension: 2.9 cm    LVOT diam: 2.3 cm  Doppler Measurements and Calculations    MV E point: 67 cm/sec    MV A point: 86 cm/sec    MV E/A: 0.78     MV P1/2t max vel: 68 cm/sec    MV P1/2t: 73 msec    MVA(P1/2t):  3.0 cm2    MV dec slope: 272 cm/sec2    MV dec time: 0.24 sec    Ao V2 max: 118 cm/sec    Ao max PG: 5.5 mmHg    Ao V2 mean: 67 cm/sec    Ao mean PG: 2.1 mmHg    Ao V2 VTI: 21 cm    AVA(I,D): 3.9 cm2    AVA(V,D): 3.2 cm2    AI max vel: 222 cm/sec    AI max PG: 20 mmHg    AI dec slope: 96 cm/sec2    AI P1/2t: 675 msec    LV max PG: 3.0 mmHg    LV mean PG: 1.1 mmHg    LV V1 max: 89 cm/sec    LV V1 mean: 45 cm/sec    LV V1 VTI: 20 cm    MR max vel: 394 cm/sec    MR max PG: 62 mmHg    SV(LVOT): 81 ml    PA V2 max: 62 cm/sec    PA max PG: 1.5 mmHg    PA acc slope: 534 cm/sec2    PA acc time: 0.12 sec    TR Max vel: 273 cm/sec    TR Max PG: 29 mmHg    RVSP: 39 mmHg    RAP systole: 10 mmHg    PA pr(Accel): 27 mmHg    Reading Physician: Lujean Amel   Sonographer: Jaci Standard  Interpreting Physician:  Lujean Amel,  electronically signed on   08-19-2011 13:56:05  Requesting Physician: Lujean Amel  CT:    31-Dec-12 16:36, CT Chest, Abd, and Pelvis With Contrast   CT Chest, Abd, and Pelvis With Contrast    REASON FOR EXAM:    (1) chest pain, ?PE vs pneumonia; (2) abdominal pain,   diarrhea, N/V  COMMENTS:       PROCEDURE: CT  - CT CHEST ABDOMEN AND PELVIS W  - Aug 18 2011  4:36PM     RESULT: CT CHEST, ABDOMEN, AND PELVIS    History: Chest pain, abdominal pain    Comparison: None    Technique:  A thin-section spiral CT from the lung apices to the upper   abdomen was acquired on a multi slice scanner following 148ml Isovue-370   intravenous contrast. These images were then transferred to the Siemens     work station and were subsequently reviewed utilizing 3-D reconstructions   and MIP images. Multiple axial images were obtained of the abdomen and   pelvis, with p.o. contrast and with 100 ml of Isovue-370 intravenous   contrast.     Findings:     CHEST:    There is adequate opacification of the pulmonary arteries. There is no   pulmonary embolus.  The main pulmonary artery, right main pulmonary   artery, and left main pulmonary arteries are normal in size. The heart   size is normal. There is no pericardial effusion. There is coronary   artery atherosclerosis.    The lungs are clear. There is no focal consolidation, pleural effusion,   or pneumothorax.     There is no axillary, hilar, or mediastinal adenopathy.    The osseous structures are unremarkable.    ABDOMEN/PELVIS:    The  liver demonstrates no focal abnormality. There is no intrahepatic or   extrahepatic biliary ductal dilatation. The gallbladder is unremarkable.   The spleen demonstrates no focal abnormality. The kidneys, adrenal   glands, pancreas are normal. The bladder is unremarkable.     The stomach, duodenum, small intestine, and large intestine demonstrate     no contrast extravasation or dilatation. There is bowel wall thickening   involving the descending colon, sigmoid colon and rectum most concerning   for colitis which may be secondary to an infectious, inflammatory or   ischemic etiology. There is no pneumoperitoneum, pneumatosis, or portal   venous gas. There is no abdominal or pelvic free fluid. There isno   lymphadenopathy.     The abdominal aorta is normal in caliber with atherosclerosis.    There is lumbar spine spondylosis.    IMPRESSION:     1. No CT evidence of pulmonary embolus.     2. There is bowel wall thickening involving the descending colon, sigmoid   colon and rectum most concerning for colitis which may be secondary to an   infectious, inflammatory or ischemic etiology.          Verified By: Jennette Banker, M.D., MD   Assessment/Plan:  Invasive Device Daily Assessment of Necessity:   Does the patient currently have any of the following indwelling devices? none   Assessment/Plan:   Assessment IMP Possible angina CP Abdominal pain Gastroenteritis Diarrhea Colitis Dehydration Weight loss    Plan PLAN Should be  an acceptable risk for scope Hydration Antibx for possible colitis Rec scope Doubt MI or angina Protonix Keep NPO for now Agree with GI input Conservative cardiac care   Electronic Signatures: Yolonda Kida (MD)  (Signed 07-Jan-13 11:39)  Authored: Chief Complaint, VITAL SIGNS/ANCILLARY NOTES, Brief Assessment, Lab Results, Radiology Results, Assessment/Plan   Last Updated: 07-Jan-13 11:39 by Lujean Amel D (MD)

## 2014-12-10 NOTE — Consult Note (Signed)
Chief Complaint:   Subjective/Chief Complaint patient continues with post prandial abdominal pain  though diarrhea may be slowly improving. Patietn states she only has abdominal pain after eating, though very showtly afterwards.  colon biopsies show concern for left sided ischemic colitis.   VITAL SIGNS/ANCILLARY NOTES: **Vital Signs.:   07-Jan-13 13:12   Vital Signs Type Q 4hr   Temperature Temperature (F) 98.3   Celsius 36.8   Temperature Source oral   Pulse Pulse 81   Pulse source per Dinamap   Respirations Respirations 18   Systolic BP Systolic BP 94   Diastolic BP (mmHg) Diastolic BP (mmHg) 58   Mean BP 70   BP Source Dinamap   Pulse Ox Activity Level  At rest   Oxygen Delivery Room Air/ 21 %   Brief Assessment:   Cardiac Regular    Respiratory clear BS    Gastrointestinal details normal Soft  Nontender  Nondistended  No masses palpable  Bowel sounds normal     Routine Chem:  07-Jan-13 05:46    Glucose, Serum 106   BUN 6   Creatinine (comp) 1.01   Sodium, Serum 138   Potassium, Serum 3.8   Chloride, Serum 106   CO2, Serum 21   Calcium (Total), Serum 9.1   Anion Gap 11   Osmolality (calc) 274   eGFR (African American) >60   eGFR (Non-African American) 56   Magnesium, Serum 1.6   Radiology Results: Cardiology:    01-Jan-13 09:33, Echo Doppler   Echo Doppler    Interpretation Summary    The left ventricle is not well visualized. The left ventricle is   grossly normal size. There is no thrombus. Left ventricular systolic   function is normal. Ejection Fraction = >55%. There is normal left   ventricular wall thickness. The left ventricular wall motion is   normal. The right ventricle is mild to moderately dilated. The right   ventricular systolic function is normal. There is moderate to severe   tricuspid regurgitation. Right ventricular systolic pressure is   elevated at 30-40mmHg.    PatientHeight: 175 cm    PatientWeight: 66 kg    BSA: 1.8  m2    Procedure:    A two-dimensional transthoracic echocardiogram with color flow and   Doppler was performed.    The study was completed  bedside.    Left Ventricle    There is normal left ventricular wall thickness.    Left ventricular systolic function is normal.    Ejection Fraction = >55%.    The left ventricle is not well visualized.    The left ventricle is grossly normal size.    There is no thrombus.    The left ventricular wall motion is normal.    Right Ventricle    The right ventricle is mild to moderately dilated.    There is normal right ventricular wall thickness.    The right ventricular systolic function is normal.    Atria    The leftatrial size is normal.    Right atrial size is normal.    Mitral Valve    The mitral valve leaflets appear thickened, but open well.    There is mild to moderate mitral regurgitation.    Tricuspid Valve    Right ventricular systolic pressure is elevated at 30-28mmHg.    The tricuspid valve is not well visualized, but is grossly normal.    There is moderate to severe tricuspid regurgitation.    Aortic Valve  The aortic valve is normal in structure and function.    No aortic regurgitation is present.    Pulmonic Valve    The pulmonic valve is not well seen, but is grossly normal.    There is no pulmonic valvular regurgitation.    Great Vessels    The aortic root is not well visualized but is probably normal size.    Pericardium/Pleural    No pericardial effusion.    There is no pleural effusion.    MMode 2D Measurements and Calculations    RVDd: 3.0 cm    IVSd: 1.2 cm    LVIDd: 3.6 cm    LVIDs: 2.3 cm    LVPWd: 1.1 cm    FS: 37 %    EF(Teich): 68 %    Ao root diam: 3.8 cm    ACS: 2.1 cm   LA dimension: 2.9 cm    LVOT diam: 2.3 cm    Doppler Measurements and Calculations    MV E point: 67 cm/sec    MV A point: 86 cm/sec    MV E/A: 0.78     MV P1/2t max vel: 68 cm/sec    MV P1/2t: 73 msec    MVA(P1/2t):  3.0 cm2    MV dec slope: 272 cm/sec2    MV dec time: 0.24 sec    Ao V2 max: 118 cm/sec    Ao max PG: 5.5 mmHg    Ao V2 mean: 67 cm/sec    Ao mean PG: 2.1 mmHg    Ao V2 VTI: 21 cm    AVA(I,D): 3.9 cm2    AVA(V,D): 3.2 cm2    AI max vel: 222 cm/sec    AI max PG: 20 mmHg    AI dec slope: 96 cm/sec2    AI P1/2t: 675 msec    LV max PG: 3.0 mmHg    LV mean PG: 1.1 mmHg    LV V1 max: 89 cm/sec    LV V1 mean: 45 cm/sec    LV V1 VTI: 20 cm    MR max vel: 394 cm/sec    MR max PG: 62 mmHg    SV(LVOT): 81 ml    PA V2 max: 62 cm/sec    PA max PG: 1.5 mmHg    PA acc slope: 534 cm/sec2    PA acc time: 0.12 sec    TR Max vel: 273 cm/sec    TR Max PG: 29 mmHg    RVSP: 39 mmHg    RAP systole: 10 mmHg    PA pr(Accel): 27 mmHg    Reading Physician: Lujean Amel   Sonographer: Jaci Standard  Interpreting Physician:  Lujean Amel,  electronically signed on   08-19-2011 13:56:05  Requesting Physician: Lujean Amel  CT:    31-Dec-12 16:36, CT Chest, Abd, and Pelvis With Contrast   CT Chest, Abd, and Pelvis With Contrast    REASON FOR EXAM:    (1) chest pain, ?PE vs pneumonia; (2) abdominal pain,   diarrhea, N/V  COMMENTS:       PROCEDURE: CT  - CT CHEST ABDOMEN AND PELVIS W  - Aug 18 2011  4:36PM     RESULT: CT CHEST, ABDOMEN, AND PELVIS    History: Chest pain, abdominal pain    Comparison: None    Technique:  A thin-section spiral CT from the lung apices to the upper   abdomen was acquired on a multi slice scanner following 136ml Isovue-370   intravenous contrast. These  images were then transferred to the Siemens     work station and were subsequently reviewed utilizing 3-D reconstructions   and MIP images. Multiple axial images were obtained of the abdomen and   pelvis, with p.o. contrast and with 100 ml of Isovue-370 intravenous   contrast.     Findings:     CHEST:    There is adequate opacification of the pulmonary arteries. There is no   pulmonary embolus.  The main pulmonary artery, right main pulmonary   artery, and left main pulmonary arteries are normal in size. The heart   size is normal. There is no pericardial effusion. There is coronary   artery atherosclerosis.    The lungs are clear. There is no focal consolidation, pleural effusion,   or pneumothorax.     There is no axillary, hilar, or mediastinal adenopathy.    The osseous structures are unremarkable.    ABDOMEN/PELVIS:    The liver demonstrates no focal abnormality. There is no intrahepatic or   extrahepatic biliary ductal dilatation. The gallbladder is unremarkable.   The spleen demonstrates no focal abnormality. The kidneys, adrenal   glands, pancreas are normal. The bladder is unremarkable.     The stomach, duodenum, small intestine, and large intestine demonstrate     no contrast extravasation or dilatation. There is bowel wall thickening   involving the descending colon, sigmoid colon and rectum most concerning   for colitis which may be secondary to an infectious, inflammatory or   ischemic etiology. There is no pneumoperitoneum, pneumatosis, or portal   venous gas. There is no abdominal or pelvic free fluid. There isno   lymphadenopathy.     The abdominal aorta is normal in caliber with atherosclerosis.    There is lumbar spine spondylosis.    IMPRESSION:     1. No CT evidence of pulmonary embolus.     2. There is bowel wall thickening involving the descending colon, sigmoid   colon and rectum most concerning for colitis which may be secondary to an   infectious, inflammatory or ischemic etiology.          Verified By: Jennette Banker, M.D., MD   Assessment/Plan:  Assessment/Plan:   Assessment 1) abdominal pain, at this point post prandial.  colon biopsies show concern for ischemic colitis in the left colon.  I have asked radiology to review the CT for adequacy to evaluate for mesenteric ischemia/insufficiency.  This could be consistant with the  symptoms of weight loss/postprandial pain and diarrhea.  If the CT is concerning for mesenteric insuff, would consider vascular opinion on mesenteric angio.    Plan as above.   Electronic Signatures: Loistine Simas (MD)  (Signed 07-Jan-13 14:46)  Authored: Chief Complaint, VITAL SIGNS/ANCILLARY NOTES, Brief Assessment, Lab Results, Radiology Results, Assessment/Plan   Last Updated: 07-Jan-13 14:46 by Loistine Simas (MD)

## 2014-12-10 NOTE — Consult Note (Signed)
PATIENT NAME:  [name], [name] MR#:  [id number] DATE OF BIRTH:  11/22/1927  DATE OF CONSULTATION:  08/18/2011  REFERRING PHYSICIAN:  [facility] and Dr. Billey Gosling  CONSULTING PHYSICIAN:  Dwayne D. Callwood, MD  INDICATION: Failure to thrive, weakness, chest pain, possible angina.   HISTORY OF PRESENT ILLNESS: [name] is an 81 year old female resident of [facility] with history of multiple medical problems anxiety, depression, reflux, renal cancer, hypertension presented to the Emergency Room with 2 to 3 weeks history of weakness, fatigue, nausea, vomiting, diarrhea. She also complained of chest pain midsternal, radiating to her back. She has not been eating, has felt tired and weak. Because of chest pain she was advised to be admitted for further evaluation and care.   REVIEW OF SYSTEMS: No blackout spells. No syncope. She had nausea, vomiting. She has had no fever. No chills. No sweats. She has had weight loss. No weight gain, hemoptysis, hematemesis. No bright red blood per rectum. She has had weakness and fatigue.   PAST MEDICAL HISTORY: 1. Renal cancer. 2. Reflux. 3. Anxiety, depression.  4. Hyperlipidemia.  5. Chronic obstructive pulmonary disease.  6. Glaucoma.  7. Hypertension.  8. Renal disease. 9. Deep vein thrombosis.  10. Hiatal hernia.  11. Peripheral vascular disease. 12. Migraine headaches. 13. Gout.   PAST SURGICAL HISTORY:  1. Hysterectomy.  2. Back surgery.  3. Bilateral peripheral disease stent placement.   SOCIAL HISTORY: No smoking, alcohol consumption.   FAMILY HISTORY: Hypertension, diabetes.  ALLERGIES: ACE inhibitors and sulfa.   MEDICATIONS:  1. Vitamin D daily.  2. Urosodiol 300 mg twice a day.  3. Topamax 25 mg at bedtime. 4. Timolol eyedrops as directed.  5. Singulair 10 mg a day.  6. ProAir 2 puffs p.r.n. shortness of breath. 7. K-Dur 10 mEq a day.  8. Lasix 20 a day.  9. Pravachol 20 a day.  10. Plavix 75 a day.  11. Protonix 40  twice a day.  12. Oxybutynin 5 mg daily.  13. Lopressor 25 mg twice a day.  14. Colchicine 0.6 mg twice a day.  15. Norco 1 to 2 tablets every six hours p.r.n.   PHYSICAL EXAMINATION:  VITAL SIGNS: Blood pressure 130/90, pulse 70, respiratory rate 18, afebrile.   HEENT: Normocephalic, atraumatic. Pupils equal, reactive to light.   NECK: Supple. No significant jugular venous distention, bruit, adenopathy.   LUNGS: Clear to auscultation and percussion. No wheeze, rhonchi, rale.   HEART: Regular rate and rhythm. No significant murmur, gallops, or rubs.   ABDOMEN: Benign. No rebound, guarding or tenderness. Positive bowel sounds.   EXTREMITIES: No cyanosis, clubbing, edema.   NEUROLOGIC: Grossly intact. She is slightly lethargic.   SKIN: Normal.   LABORATORY, DIAGNOSTIC AND RADIOLOGICAL DATA: Chest x-ray unremarkable. C. difficile was negative. Troponin less than 0.02. White count 7.4, hemoglobin 14, glucose 53, BUN 16, creatinine 1.37. EKG: Normal sinus rhythm, nonspecific findings.   ASSESSMENT:  1. Chest pain, possible angina. 2. Renal insufficiency.  3. Gastroenteritis. 4. Abdominal pain. 5. Weakness, fatigue. 6. Hypoglycemia. 7. Failure to thrive. 8. Peripheral vascular disease. 9. Gastroesophageal reflux disease. 10. Chronic obstructive pulmonary disease.  11. Hyperlipidemia.  12. Gout.   PLAN: Agree with admit. Rule out for myocardial infarction. Follow cardiac enzymes. Place on telemetry. Fluid resuscitation with gentle hydration. Continue medications for reflux. Recommend GI evaluation for possible abdominal pain; this does not seem to represent a surgical abdomen. She may have gastroenteritis and again medication for nausea and vomiting as  well as fluid resuscitation may be helpful and advance diet as tolerated. Consider echocardiogram and cardiac evaluation because of her chest pain as well. Rule out for myocardial infarction with cardiac enzymes, EKG, echocardiogram  will be helpful. She has significant risk factors for coronary artery disease is very reasonable because she has peripheral disease, diabetes, hypertension and hyperlipidemia in the past. Will proceed with renal evaluation. Consider this may be related to dehydration but nephrology input may be helpful as well. Hypoglycemia is of unclear etiology; it is not clear why it is low, may be from poor p.o. intake. Continue medications for gout. Continue medications for her migraine headaches. Plavix should be continued. Pravachol should be continued for her significant lipidemia.      Base further evaluation on how she responds to therapy.  ____________________________ Loran Senters. Clayborn Bigness, MD ddc:cms D: 08/19/2011 18:05:00 ET T: 08/20/2011 10:40:39 ET JOB#: [id number]  cc: Dwayne D. Clayborn Bigness, MD, <Dictator> Yolonda Kida MD ELECTRONICALLY SIGNED 09/04/2011 14:17

## 2014-12-10 NOTE — Consult Note (Signed)
Chief Complaint:   Subjective/Chief Complaint tolerated prep well, some abdominal discomfort in the llq.  no n/v   VITAL SIGNS/ANCILLARY NOTES: **Vital Signs.:   04-Jan-13 08:35   Vital Signs Type Pre Medication   Temperature Temperature (F) 98.7   Celsius 37   Temperature Source oral   Pulse Pulse 76   Pulse source per Dinamap   Respirations Respirations 18   Systolic BP Systolic BP 123XX123   Diastolic BP (mmHg) Diastolic BP (mmHg) 72   Mean BP 90   BP Source Dinamap   Oxygen Delivery 4L   Brief Assessment:   Cardiac Regular    Respiratory clear BS    Gastrointestinal details normal Soft  Nondistended  No masses palpable  Bowel sounds normal  No rebound tenderness  tender to palpation in the right abdomen and llq.   Assessment/Plan:  Assessment/Plan:   Assessment 1) diarrhea, abnormal CT weight loss.    Plan 1) colonoscopy-I have discussed the risks benefits and complications of colonoscopy to include not limited to bleeding infection perforation and sedation and she wishes to proceed.   further recs to follow.   Electronic Signatures: Loistine Simas (MD)  (Signed 04-Jan-13 11:25)  Authored: Chief Complaint, VITAL SIGNS/ANCILLARY NOTES, Brief Assessment, Assessment/Plan   Last Updated: 04-Jan-13 11:25 by Loistine Simas (MD)

## 2014-12-10 NOTE — Consult Note (Signed)
Chief Complaint:   Subjective/Chief Complaint patient states she feels some better, but still with diarrhea and periumbilical pain, though less.   VITAL SIGNS/ANCILLARY NOTES: **Vital Signs.:   01-Jan-13 08:58   Vital Signs Type Pre Medication   Temperature Temperature (F) 98.5   Celsius 36.9   Temperature Source tympanic   Pulse Pulse 73   Pulse source per Dinamap   Respirations Respirations 18   Systolic BP Systolic BP 893   Diastolic BP (mmHg) Diastolic BP (mmHg) 61   Mean BP 74   BP Source Dinamap   Pulse Ox % Pulse Ox % 99   Pulse Ox Activity Level  At rest   Oxygen Delivery 3L   Brief Assessment:   Cardiac Regular    Respiratory clear BS    Gastrointestinal details normal Soft  Nondistended  No masses palpable  Bowel sounds normal  tender to palpation in lower abd and periumbilical region   Cardiac:  31-Dec-12 03:24    CK, Total 56   CPK-MB, Serum 0.7  Routine Chem:  31-Dec-12 03:24    Glucose, Serum 84   BUN 14   Creatinine (comp) 1.10   Sodium, Serum 140   Potassium, Serum 3.8   Chloride, Serum 103   CO2, Serum 16   Calcium (Total), Serum 8.6  Hepatic:  31-Dec-12 03:24    Bilirubin, Total 0.5   Alkaline Phosphatase 135   SGPT (ALT) 14   SGOT (AST) 32   Total Protein, Serum 6.8   Albumin, Serum 3.2  Routine Chem:  31-Dec-12 03:24    Osmolality (calc) 279   eGFR (African American) >60   eGFR (Non-African American) 51   Anion Gap 21  Routine Hem:  31-Dec-12 03:24    WBC (CBC) 5.6   RBC (CBC) 3.98   Hemoglobin (CBC) 13.0   Hematocrit (CBC) 38.9   Platelet Count (CBC) 203   MCV 98   MCH 32.6   MCHC 33.4   RDW 15.1  Cardiac:  31-Dec-12 03:24    Troponin I < 0.02  Routine Hem:  31-Dec-12 03:24    Neutrophil % 55.0   Lymphocyte % 33.4   Monocyte % 10.6   Eosinophil % 0.7   Basophil % 0.3   Neutrophil # 3.1   Lymphocyte # 1.9   Monocyte # 0.6   Eosinophil # 0.0   Basophil # 0.0  Lab:  31-Dec-12 04:00    O2 Saturation (Pulse Ox) 94    FiO2 (Pulse Ox) ra  Cardiac:  31-Dec-12 10:56    CK, Total 36   CPK-MB, Serum < 0.5   Troponin I < 0.02  Thyroid:  31-Dec-12 17:34    Thyroid Stimulating Hormone 2.79   Radiology Results: CT:    31-Dec-12 16:36, CT Chest, Abd, and Pelvis With Contrast   CT Chest, Abd, and Pelvis With Contrast    REASON FOR EXAM:    (1) chest pain, ?PE vs pneumonia; (2) abdominal pain,   diarrhea, N/V  COMMENTS:       PROCEDURE: CT  - CT CHEST ABDOMEN AND PELVIS W  - Aug 18 2011  4:36PM     RESULT: CT CHEST, ABDOMEN, AND PELVIS    History: Chest pain, abdominal pain    Comparison: None    Technique:  A thin-section spiral CT from the lung apices to the upper   abdomen was acquired on a multi slice scanner following 122ml Isovue-370   intravenous contrast. These images were then transferred to the Siemens  work station and were subsequently reviewed utilizing 3-D reconstructions   and MIP images. Multiple axial images were obtained of the abdomen and   pelvis, with p.o. contrast and with 100 ml of Isovue-370 intravenous   contrast.     Findings:     CHEST:    There is adequate opacification of the pulmonary arteries. There is no   pulmonary embolus. The main pulmonary artery, right main pulmonary   artery, and left main pulmonary arteries are normal in size. The heart   size is normal. There is no pericardial effusion. There is coronary   artery atherosclerosis.    The lungs are clear. There is no focal consolidation, pleural effusion,   or pneumothorax.     There is no axillary, hilar, or mediastinal adenopathy.    The osseous structures are unremarkable.    ABDOMEN/PELVIS:    The liver demonstrates no focal abnormality. There is no intrahepatic or   extrahepatic biliary ductal dilatation. The gallbladder is unremarkable.   The spleen demonstrates no focal abnormality. The kidneys, adrenal   glands, pancreas are normal. The bladder is unremarkable.     The stomach,  duodenum, small intestine, and large intestine demonstrate     no contrast extravasation or dilatation. There is bowel wall thickening   involving the descending colon, sigmoid colon and rectum most concerning   for colitis which may be secondary to an infectious, inflammatory or   ischemic etiology. There is no pneumoperitoneum, pneumatosis, or portal   venous gas. There is no abdominal or pelvic free fluid. There isno   lymphadenopathy.     The abdominal aorta is normal in caliber with atherosclerosis.    There is lumbar spine spondylosis.    IMPRESSION:     1. No CT evidence of pulmonary embolus.     2. There is bowel wall thickening involving the descending colon, sigmoid   colon and rectum most concerning for colitis which may be secondary to an   infectious, inflammatory or ischemic etiology.          Verified By: Jennette Banker, M.D., MD   Assessment/Plan:  Assessment/Plan:   Assessment 1) weight loss, mid to lower abdominal pain.  CT discussed with radiology-no evidence of mesenteric insufficiency. /some thickening of the left colon, possible colitis.    Plan 1) will proceed with colonoscopy tomorrow pm.  I have discussed the risks benefits and complications of colonoscopy to include not limited to bleeding infection perforation and sedation and she wishes to proceed.   Electronic Signatures: Loistine Simas (MD)  (Signed 01-Jan-13 13:45)  Authored: Chief Complaint, VITAL SIGNS/ANCILLARY NOTES, Brief Assessment, Lab Results, Radiology Results, Assessment/Plan   Last Updated: 01-Jan-13 13:45 by Loistine Simas (MD)

## 2014-12-10 NOTE — Consult Note (Signed)
PATIENT NAME:  [name], [name] MR#:  [id number] DATE OF BIRTH:  11-Jan-1928  DATE OF CONSULTATION:  08/18/2011  REFERRING PHYSICIAN:   CONSULTING PHYSICIAN:  Theodore Demark, NP FAMILY PHYSICIAN:  None local.    REASON FOR ADMISSION: Failure to thrive with malnutrition, weakness, intractable diarrhea, and chest pain.   HISTORY OF PRESENT ILLNESS: [name] is seen in consultation at the request of Dr. Doy Hutching for nausea, vomiting, and intractable diarrhea. She is an 81 year old African American female who resides at [facility] and has a history of PBC, PVD, hypertension, hyperlipidemia, Raynaud's, GERD, renal cell carcinoma status post cryoblation, supraventricular tachycardia, and spinal stenosis. She reports that over the last 2 to 3 weeks she has had no appetite, feels nauseated, weak, and has umbilical tenderness and weight loss. She states that every time she eats she has an urgent, loose, watery, dark brown stool.  Sometimes she experiences fecal incontinence and night wakenings. She denies the presence of blood and mucus in the stool. She has also had some emesis after meals, but states that this has improved since hospitalization. She is not having any trouble swallowing at present and does not have any acid reflux or heartburn complaints. She also denies fevers and sick exposures. She has lost 24 pounds since her September GI clinic visit.   Colonoscopy in 2003 revealed internal hemorrhoids and diverticulum.  EGD in July 2012 revealed abnormal motility of the esophagus with reverse peristalsis and a low-grade Schatzki ring that was dilated, a small hiatal hernia, otherwise was normal. Gastric emptying study 04/2011 was within normal limits. Abdominal ultrasound last year for PBC, showed no significant abnormalities to the liver, spleen, abdominal aorta, and vena cava. It did show stable bilateral renal cysts. She has been maintained on Ursodiol 300 mg p.o. b.i.d. for her PBC.  So far stool  studies have been negative for Clostridium difficile and no growth to the stool culture done yesterday at present.   PAST MEDICAL HISTORY: Renal cancer, gastroesophageal reflux disease, anxiety, depression, hyperlipidemia, chronic obstructive pulmonary disease, asthma, glaucoma, benign hypertension, Raynaud's, deep vein thrombosis, hiatal hernia, PVD, S/P bilateral lower extremity stent placements, migraine headaches, gout, hysterectomy, back surgery.    MEDICATIONS:   1. Vitamin D 2000 units p.o. daily.  2. Ursodiol 300 mg p.o. b.i.d.  3. Topamax 25 mg p.o. at bedtime.   4. Atenolol eyedrops q. day as directed.  5. Singular 10 mg p.o. q. day.   6. Pro-Air 2 puffs q.4 hours p.r.n. shortness of breath.   7. K-Dur 10 mEq p.o. q. day.  8. Lasix 20 mg p.o. q. day.  9. Pravachol 20 mg p.o. q. day.  10. Plavix 75 mg p.o. q. day.  11. Protonix 40 mg p.o. b.i.d.  12. Oxybutynin 5 mg p.o. q. day.  13. Lopressor 25 mg p.o. b.i.d.  14. Colchicine 0.6 mg p.o. b.i.d. 15. Norco 1 to 2 p.o. q.6h. p.r.n. pain.   ALLERGIES: ACE inhibitors and sulfa.    SOCIAL HISTORY: No alcohol, tobacco, or illicits. Lives at [facility].   FAMILY HISTORY: Positive for diabetes, hypertension, and cancer of unknown type. Does not think this was GI-related cancers.   REVIEW OF SYSTEMS:  CONSTITUTIONAL: Positive for weight loss, fatigue, weakness. No fever or chills. HEENT: Intermittent headaches at times. No visual changes. Does have history of glaucoma. No tinnitus or hearing loss, nasal discharge, or bleeding. No trouble swallowing. RESPIRATORY: Does have shortness of breath at rest. This has not changed over the  last several months. No cough or wheezing. CARDIOVASCULAR: She was admitted with some chest pain, however states at present that this has resolved. Does report chest pain with exertion. No orthopnea, palpitations, or syncope. Occasionally she will wake up at night with shortness of breath that is resolved by her  inhaler. GI: As noted. GU: No dysuria or hematuria. No incontinence. ENDOCRINE: No history of diabetes or hypothyroidism. Feels cold frequently. HEMATOLOGIC: No history of anemia, easy bruising, or bleeding. MUSCULOSKELETAL: No unusual muscle or joint pain. Does have history of gout. NEUROLOGIC: No numbness, tingling, seizures, or CVA. PSYCHIATRIC: No issues with depression, anxiety at present.    LABORATORY WORK:  Most recent lab work August 18, 2011: Serum glucose 84, BUN 14, creatinine 1.1, serum sodium 140, serum potassium 3.8, serum chloride 103, CO2 of 16, GFR greater than 60, serum calcium 8.6, total serum protein 6.8, serum albumin 3.2, total bilirubin 0.5, ALT 135, AST 32, ALT 14. Troponin enzymes and CPK-MBs have been nonelevated x3. WBC 5.6, hemoglobin 13.0, hematocrit 38.9, platelet count 203,000, normocytic. C. difficile negative. Stool culture no growth at present. Urinalysis was significant for 2+ ketones on admission, no urinary tract infection. The patient is currently in sinus rhythm per the monitor. Chest x-ray with no acute disease of the chest.   PHYSICAL EXAMINATION:  VITAL SIGNS: Most recent vital signs: Temperature 98, pulse 71, respiratory rate 18, blood pressure 123/61, SaO2 of 97%.    GENERAL: Chronically ill-appearing, cachectic, elderly female in no acute distress.   HEENT: Normocephalic, atraumatic. Eyes without icterus, redness, inflammation, drainage.  Nares without drainage and inflammation. Oral mucous membranes pink and moist.   NECK: Supple. No lymphadenopathy, thyromegaly, or JVD.   LUNGS: Respirations eupneic. Lungs CTAB.   CARDIAC: S1, S2. Regular rate and rhythm. No MRG. Radial pulses 2+ bilaterally. No edema.   ABDOMEN: Flat, nondistended, active BS x4. Soft.  Mild umbilicus tenderness. No rebound or guarding. No hepatosplenomegaly, masses, hernias, or peritoneal signs.   RECTAL: Normal-appearing rectum, heme negative.   SKIN: Warm, dry, pink. No  redness, erythema, or lesions.   NEUROLOGIC: Alert and oriented x3. Cranial nerves II through XII grossly intact. Moves all extremities well x4. No deficits to sensation.   PSYCHIATRIC: Alert, oriented x3. Cooperative. Good memory skills, logical thought.   EXTREMITIES: No clubbing, cyanosis or edema.   IMPRESSION: Abdominal pain with nausea, vomiting, diarrhea.  Stable PBC with controlled liver enzymes. We will check AFP and TSH. Did talk with Dr. Holley Raring who is ordering contrasted CT abdomen, chest and pelvis. We will wait for CT results prior to planning colonoscopy.   These services were provided by Stephens November, MSN, [place] in collaboration with Lollie Sails.      ____________________________ Theodore Demark, NP chl:vtd D: 08/18/2011 16:20:33 ET T: 08/20/2011 08:22:03 ET JOB#: [id number]  cc: Theodore Demark, NP, <Dictator> [place] SIGNED 08/20/2011 10:06

## 2014-12-17 NOTE — Consult Note (Signed)
PATIENT NAME:  [name], [name] MR#:  [id number] DATE OF BIRTH:  Oct 03, 1927  DATE OF CONSULTATION:  09/15/2014  REFERRING PHYSICIAN:  Dr. Manuella Ghazi, requesting physician  CONSULTING PHYSICIAN:  Cheral Marker. Ola Spurr, MD  REASON FOR CONSULT: Heel ulcer.    HISTORY OF PRESENT ILLNESS: This is an 81 year old female, history of peripheral vascular disease, heart disease and hypertension as well as diet controlled diabetes who was admitted January 28 with nausea, vomiting, and body aches. She also was found to have a heel ulcer that has been present for several weeks and has been followed at the wound care center.   Since admission, she has been seen by podiatry and has had bedside debridement. She has been treated with antibiotics. Vascular has been consulted.   PAST MEDICAL HISTORY:  1.  Peripheral vascular disease with apparent Raynaud's.  2.  Heart disease.  3.  Hypertension.  4.  Diabetes.  5.  Gout.  6.  History of renal cell cancer.  7.  Ischemic colitis.  8.  Glaucoma.  9.  Primary biliary cirrhosis.   PAST SURGICAL HISTORY: Back surgery, tonsillectomy, cataracts and hysterectomy.   ALLERGIES: No known drug allergies.   SOCIAL HISTORY: The patient lives at [facility]. No alcohol or tobacco use.   FAMILY HISTORY: Noncontributory.   REVIEW OF SYSTEMS: Eleven systems reviewed and negative except as per HPI.    ANTIBIOTICS SINCE ADMISSION: Include ceftriaxone and vancomycin.   PHYSICAL EXAMINATION:  VITAL SIGNS: Temperature 97.7, she has been afebrile since admission, pulse 72, blood pressure 122/60, respirations 18, saturation 98% on room air.  GENERAL: She is quite frail and elderly appearing.  HEENT: Pupils reactive. Extraocular movements are intact. Sclerae anicteric. Oropharynx clear.  NECK: Supple.  HEART: Regular.  LUNGS: Clear.  ABDOMEN: Soft, nontender, nondistended. No hepatosplenomegaly.  EXTREMITIES: She has no edema.  SKIN: On her heel of her left foot  she has an impressive lateral ulceration. There is some necrotic debris. It measures approximately 2.5 cm. There was also ulceration over the left ankle.   NEUROLOGIC: She is alert and oriented x 3.   DATA: X-ray of the foot shows no evidence of underlying osteomyelitis.   Blood work: A1c is 5.6. Renal function is normal with a creatinine of 1.19, white count 13.7, hemoglobin 10.9, platelets 278, CRP is 54.9, sedimentation rate was 126, white blood count on January 28 was 18.1.   Cultures:  Culture of the left heel done in the ER on January 26 grew Klebsiella pneumoniae sensitive to ciprofloxacin, group B strep, MRSA and Staphylococcus hemolyticus.     IMPRESSION: An 81 year old female, history of peripheral vascular disease, admitted with nausea, vomiting, leukocytosis, found to have an infected appearing heel ulceration likely due to peripheral vascular disease. There has some bedside debridement. Cultures were mixed. ESR is quite elevated at 126. Vascular surgery has been consulted.     RECOMMENDATIONS: At this point, I would recommend continuing vancomycin and ceftriaxone. However, following further debridement and revascularization she likely could be treated with a prolonged course of oral antibiotics. Doxycycline to be given to cover the MRSA as well as Staphylococcus hemolyticus. This would also provide coverage for Streptococcus agalactiae. Ciprofloxacin would cover the Klebsiella species as well. She will need a prolonged course of at least 4 to 6 weeks for wound healing.   Thank you for the consult. I will be glad to follow with you.   ____________________________ Cheral Marker. Ola Spurr, MD dpf:AT D: 09/15/2014 01:75:10 ET T: 09/15/2014 25:85:27  ET JOB#: [id number]  cc: Cheral Marker. Ola Spurr, MD, <Dictator> Shalev Helminiak Ola Spurr MD ELECTRONICALLY SIGNED 09/27/2014 20:49

## 2014-12-17 NOTE — H&P (Signed)
PATIENT NAME:  [name], [name] MR#:  [id number] DATE OF BIRTH:  [birth year]  DATE OF ADMISSION:  09/14/2014  PRIMARY CARE PHYSICIAN:  Is in [place]: I'm sick.   HISTORY OF PRESENT ILLNESS: This is an 81 year old female with multiple medical problems, presents with being sick. Her whole body is hurting over the last 2-3 days. On Tuesday they took a wound culture of the left foot and given pain medication. She was sleeping all day yesterday. She had nausea and vomiting this morning, 2 days of diarrhea. She noticed an ulcer on her foot 1 month ago, is following currently at the wound care center. In the ER she was found to have an elevated white count, borderline troponin, and low sugar. Wound culture grew out Klebsiella pneumoniae sensitive to most antibiotics. Hospitalist services were contacted for further evaluation.   PAST MEDICAL HISTORY: Heart disease, hypertension, diabetes diet controlled, Raynaud phenomenon, glaucoma, gout, history of renal cell cancer, ischemic colitis, peripheral vascular disease, primary biliary cirrhosis, on numerous psychiatric medications, and chronic pain medications.   PAST SURGICAL HISTORY: Back surgery, tonsils, cataracts, hysterectomy.   ALLERGIES: No known drug allergies.   MEDICATIONS: As per prescription writer include allopurinol 100 mg daily, amitriptyline 25 mg at bedtime, Plavix 75 mg daily, Dulcolax 60 mg daily, Flovent CFC 44 mcg 2 puffs twice a day, furosemide 20 mg daily, gabapentin 600 mg 3 times a day, guaifenesin 600 mg q. 12 hours., levothyroxine 25 mcg daily, metoprolol ER 25 mg daily, Singulair 10 mg daily, morphine 15 mg once a day as needed, morphine 30 mg q. 12 hours, Protonix 40 mg daily, potassium chloride 10 mEq daily, ProAir 2 puffs every 4  hours, Senexon-S 50/8.6 one tablet daily, Topamax 50 mg at bedtime, ursodiol 300 mg twice a day, vitamin B12 1000 mcg daily, vitamin D 1000 international units 2 tablets daily.   SOCIAL  HISTORY: Lives at [facility] assisted living. No smoking. No alcohol. No drug use. Used to work as a Training and development officer.   FAMILY HISTORY: Mother died after childbirth. Father died of possibly lung cancer.   REVIEW OF SYSTEMS:    CONSTITUTIONAL: Positive for chills. Positive for sweats. Positive for weight loss. Positive for fatigue.  EYES: She does wear glasses.  EARS, NOSE, MOUTH, AND THROAT: Decreased hearing. Positive for runny nose. No sore throat. No difficulty swallowing.  CARDIOVASCULAR: No chest pain. No palpitations.  RESPIRATORY: No shortness of breath. No cough. No sputum. No hemoptysis.  GASTROINTESTINAL: Positive for nausea and vomiting this morning. No hematemesis. No abdominal pain. Positive for diarrhea. No bright red blood per rectum. No melena.  GENITOURINARY: Positive for joint pain all over her body, especially that left ankle area with the ulcer.  INTEGUMENT: Positive for ulcer on the left ankle area.  NEUROLOGIC: No fainting or blackouts.  PSYCHIATRIC: On medication for depression.  ENDOCRINE: Positive for hypothyroidism.  HEMATOLOGIC AND LYMPHATIC: No anemia.   PHYSICAL EXAMINATION:  VITAL SIGNS: Temperature 97.9, pulse 99, respirations 18, blood pressure 160/86, pulse oximetry 100% on room air.  GENERAL: No respiratory distress.  EYES: Conjunctivae and lids normal. Pupils equal, round, and reactive to light. Extraocular muscles intact. No nystagmus.  EARS, NOSE, MOUTH, AND THROAT: Tympanic membranes, no erythema. Nasal mucosa, no erythema, no exudate seen. Lips and gums, no lesions.  NECK: No JVD. No bruits. No lymphadenopathy. No thyromegaly. No thyroid nodules palpated.  RESPIRATORY:  Lungs clear to auscultation. No use of accessory muscles to breathe. No rhonchi, rales, or wheeze  heard.  CARDIOVASCULAR: S1, S2 normal. No gallops, rubs, or murmurs heard. Carotid upstroke 2 + bilaterally. No bruits. Dorsalis pedis pulses 1 + bilaterally. No edema of the lower extremity.   ABDOMEN: Soft, nontender. No organomegaly/splenomegaly. Normoactive bowel sounds. No masses felt.  LYMPHATIC: No lymph nodes in the neck.  MUSCULOSKELETAL: No clubbing, edema, cyanosis. SKIN: Left foot, first toe small little healing scab seen on the top of the foot. Left lateral ankle heel area, there is a large silver dollar type ulcer, deep does not look like any drainage, some granulation tissue, looks like it has been going on for a while.  NEUROLOGIC: Cranial nerves II through XII grossly intact. Deep tendon reflexes half + bilateral lower extremities.  PSYCHIATRIC: The patient is oriented to person, place, and time.   LABORATORY AND RADIOLOGICAL DATA: Urinalysis 1 + ketones, otherwise negative. Glucose 128, BUN 12, creatinine 1.10, sodium 138, potassium 3.6, chloride 108, CO2 of 19, calcium 9.0. Liver function tests, alkaline phosphatase 149, ALT 9, AST 23. White blood cell count 18.1, H and H 13.1 and 40.5, platelet count of 332,000. Troponin borderline at 0.8. Fingerstick 53. Wound culture from the 26th of the wound showed Klebsiella pneumoniae.  EKG, sinus rhythm, 98 beats per minute, PVCs.   ASSESSMENT AND PLAN:  1.  Heel wound with leukocytosis with Klebsiella growing out of the culture. We will give IV Rocephin, get podiatry consultation. Hold Plavix just in case debridement needed. We will get an x-ray of the foot, check an ESR and CRP. Continue to monitor closely.  2.  Hypoglycemia with history of diabetes. I will check a hemoglobin A1c. The sugar on the chemistry was normal range, but the fingersticks are low. Looking back Dr. Gabriel Carina did see this patient as inpatient the last time she was here and said fingersticks are inaccurate with people with Raynaud disease, should go by the chemistry fingerstick. I will put D5 in the IV fluids and continue to monitor closely and check a hemoglobin A1c.  3.  Acute encephalopathy, likely secondary to all these pain medications the patient is on.  I  will give p.r.n. oxycodone for pain and hold the long acting morphine and the large dose of the MSIR, hold the gabapentin, discontinue the amitriptyline, decreased the Topamax dose. 4.  Nausea, vomiting, and diarrhea, could be chronic for this patient but I am unclear. We will send off a stool for Clostridium difficile.   5.  Elevated troponin, likely from infection and demand ischemia. We will put on off unit telemetry and get serial cardiac enzymes.  6.  Hypothyroidism. Continue levothyroxine.  7.  Hypertension. Continue metoprolol.  8.  Gastroesophageal reflux disease without esophagitis. We will continue Protonix and she takes that at home.  9.  Primary biliary cirrhosis on high-dose ursodiol.    TIME SPENT ON ADMISSION: 55 minutes.   CODE STATUS: The patient is a Do Not Resuscitate.      ____________________________ Tana Conch. Leslye Peer, MD rjw:bu D: 09/14/2014 14:06:32 ET T: 09/14/2014 14:22:28 ET JOB#: [id number]  cc: Tana Conch. Leslye Peer, MD, <Dictator> Dr. Oliver Barre Marisue Brooklyn MD ELECTRONICALLY SIGNED 09/15/2014 14:44

## 2014-12-17 NOTE — Consult Note (Signed)
Present Illness today compalins of right leg pain as well as left foot pain   Home Medications: Medication Instructions Status  clopidogrel 75 mg oral tablet 1 tab(s) orally once a day (900 am) Active  DULoxetine 60 mg oral delayed release capsule 1 cap(s) orally once a day (9 am) **do not crush** Active  morphine 30 mg/12 hr oral tablet, extended release 1 tab(s) orally 2 times a day (control) (9am, 9pm) **do not crush** Active  Flovent CFC free 44 mcg/inh inhalation aerosol 2 puff(s) inhaled 2 times a day Active  furosemide 20 mg oral tablet 1 tab(s) orally once a day Active  morphine 15 mg oral tablet 1 tab(s) orally once a day, As Needed Active  montelukast 10 mg oral tablet 1 tab(s) orally once a day at (9pm). Active  pantoprazole 40 mg oral delayed release tablet 1 tab(s) orally once a day. Active  topiramate 50 mg oral tablet 1 tab(s) orally once a day (at bedtime) Active  gabapentin 600 mg oral tablet 1 tab(s) orally 3 times a day Active  Metoprolol Succinate ER 25 mg oral tablet, extended release 1 tab(s) orally once a day Active  Senexon-S 50 mg-8.6 mg oral tablet 1 tab(s) orally once a day Active  amitriptyline 25 mg oral tablet 1 tab(s) orally once a day (at bedtime) Active  Vitamin B12 1000 mcg oral tablet 1 tab(s) orally once a day Active  guaiFENesin 600 mg oral tablet, extended release 1 tab(s) orally every 12 hours, As Needed Active  ursodiol 300 mg oral capsule 1 cap(s) orally 2 times a day (9 am, 9 pm) Active  ProAir HFA 90 mcg/inh inhalation aerosol 2 puff(s) inhaled every 4 hours as needed for shortness of breath Active  Vitamin D3 1000 intl units oral tablet 2 tabs (2000iu) orally once a day (9 am) Active  potassium chloride 10 mEq oral tablet, extended release 1 tab(s) orally once a day (9 am) **do not crush** Active  levothyroxine 25 mcg (0.025 mg) oral tablet 1 tab(s) orally once a day (6 am) Active  allopurinol 100 mg oral tablet 1 tab(s) orally once a day (9 am)  Active    Ace Inhibitors: Cough  Septra: N/V/Diarrhea  Case History:  Family History Non-Contributory   Review of Systems:  ROS No TIA/stroke/seizure No heat or cold intolerance No dysuria/hematuria No blurry or double vision No tinnitus or ear pain No rashes or ulcer No suicidal ideation or psychosis No signs of bleeding or easy bruising No SOB/DOE, orthopnea, or sputum No palpitations or chest pain No N/V/D or abdominal pain No joint pain or joint swelling No fever or chills No unintentional weight loss or gain   Physical Exam:  GEN well developed, well nourished, no acute distress   HEENT hearing intact to voice, moist oral mucosa   NECK supple  trachea midline   RESP normal resp effort  no use of accessory muscles   CARD regular rate  no JVD   ABD denies tenderness  soft   EXTR negative cyanosis/clubbing, positive edema, pedal pulses nonpalp   SKIN positive rashes, positive ulcers   Nursing/Ancillary Notes: **Vital Signs.:   02-Feb-16 16:00  Vital Signs Type Routine  Temperature Temperature (F) 98.2  Celsius 36.7  Temperature Source oral  Pulse Pulse 80  Respirations Respirations 17  Systolic BP Systolic BP 111  Diastolic BP (mmHg) Diastolic BP (mmHg) 66  Pulse Ox % Pulse Ox % 96  Pulse Ox Activity Level  At rest  Oxygen  Delivery Room Air/ 21 %   TDMs:  01-Feb-16 20:50   Vancomycin, Trough LAB 13 (Result(s) reported on 18 Sep 2014 at 09:31PM.)  Routine Chem:  01-Feb-16 03:47   Creatinine (comp) 1.04  eGFR (African American) >60  eGFR (Non-African American)  54 (eGFR values <18mL/min/1.73 m2 may be an indication of chronic kidney disease (CKD). Calculated eGFR, using the MRDR Study equation, is useful in  patients with stable renal function. The eGFR calculation will not be reliable in acutely ill patients when serum creatinine is changing rapidly. It is not useful in patients on dialysis. The eGFR calculation may not be applicable to  patients at the low and high extremes of body sizes, pregnant women, and vegetarians.)  Routine Hem:  01-Feb-16 03:47   WBC (CBC) 8.3  RBC (CBC) 4.02  Hemoglobin (CBC) 12.0  Hematocrit (CBC) 37.2  Platelet Count (CBC) 254  MCV 92  MCH 29.9  MCHC 32.3  RDW  16.5  Neutrophil % 60.6  Lymphocyte % 26.9  Monocyte % 7.6  Eosinophil % 4.5  Basophil % 0.4  Neutrophil # 5.0  Lymphocyte # 2.2  Monocyte # 0.6  Eosinophil # 0.4  Basophil # 0.0 (Result(s) reported on 18 Sep 2014 at 04:46AM.)    Impression 1.  ASO with left heel ulceration, the heel wound is associated with leukocytosis, Clx + Klebsiella and MRSA growing out of the culture. Patient on IV Abx.  Patient condition stable.  She does not have palpable pedal pulses and therefore has significant PAD.  She will need revascularization of her left leg to optimize wound healing.  No change in the plan at this time angio tomorrow.  Further debridement of the heel will be based on successful revascularization 2.  Hypoglycemia with history of diabetes. I will check a hemoglobin A1c. start D5 in the IV fluids and continue to monitor closely and check a hemoglobin A1c.  3.  Acute encephalopathy, likely secondary to all these pain medications the patient is on.  I will give p.r.n. oxycodone for pain and hold the long acting morphine and the large dose of the MSIR, hold the gabapentin, discontinue the amitriptyline, decreased the Topamax dose. 4.  Nausea, vomiting, and diarrhea, could be chronic for this patient but I am unclear. We will send off a stool for Clostridium difficile.   5.  Elevated troponin, likely from infection and demand ischemia. We will put on off unit telemetry and get serial cardiac enzymes.   Electronic Signatures: Hortencia Pilar (MD)  (Signed [phone] 22:29)  Authored: General Aspect/Present Illness, Home Medications, Allergies, History and Physical Exam, Vital Signs, Labs, Impression/Plan   Last Updated: 02-Feb-16  22:29 by Hortencia Pilar (MD)

## 2014-12-17 NOTE — Discharge Summary (Signed)
PATIENT NAME:  [name], [name] MR#:  [id number] DATE OF BIRTH:  [birth year]  DATE OF ADMISSION:  09/14/2014 DATE OF DISCHARGE:  09/22/2014  ADMITTING PHYSICIAN: Loletha Grayer, MD.    DISCHARGING PHYSICIAN:  Gladstone Lighter, MD.    PRIMARY CARE PHYSICIAN: Nonlocal, in [place]:  1. Palliative care consultation by Dr. Izora Gala Phifer.  2. Podiatric consultation by Dr. Sharlotte Alamo.  3. ID consultation by Dr. Ola Spurr.  4. Vascular consultation by Dr. Delana Meyer.   DISCHARGE DIAGNOSES:  1. Left foot arterial ulcers with cellulitis status post angiogram.  2. Peripheral arterial disease.  3. Bilateral feet neuropathy pain.  4. Hypoglycemia.  5. Raynaud syndrome.  6. Anemia of chronic disease.  7. Biliary cirrhosis.  8. Hypertension.  9. Hypothyroidism.   DISCHARGE HOME MEDICATIONS:  1. Ursodiol 300 mg 1 capsule p.o. b.i.d.  2. ProAir inhaler 2 puffs every 4 hours as needed for shortness of breath.  3. Vitamin D3, 1000 international units 2 tablets once a day.  4. Levothyroxine 25 mcg p.o. daily.  5. Singulair 10 mg p.o. daily.  6. Protonix 40 mg p.o. daily.  7. Senokot 1 tablet p.o. daily.  8. Vitamin B12, 1000 mcg p.o. daily.  9. Guaifenesin 600 mg 1 tablet b.i.d. p.r.n.  10. Flovent inhaler 2 puffs twice a day.  11. Amitriptyline 25 mg p.o. at bedtime.  12. Allopurinol 100 mg p.o. daily.  13. Plavix 75 mg p.o. daily.  14. Toprol 25 mg p.o. daily.  15. Gabapentin 300 mg p.o. 3 times a day.  16. Topamax 25 mg p.o. at bedtime.  17. Cymbalta 60 mg p.o. daily.  18. Oxycodone 5 mg q. 4 hours p.r.n. for severe pain.  19. Ciprofloxacin 500 mg p.o. b.i.d. for 6 weeks.  20. Doxycycline 100 mg p.o. b.i.d. for 6 weeks.   DISCHARGE DIET:  Regular diet. consistency- mechanical soft diet with thin liquids and aspiration precautions.   HOME OXYGEN: None.    FOLLOWUP INSTRUCTIONS:  1. Vascular followup in 1-2 weeks with Dr. Delana Meyer.   2. PCP followup in  1-2 weeks.  3. Follow up with Dr. Ola Spurr in 4 weeks.  4. Resume home health services.    LABORATORY AND IMAGING STUDIES PRIOR TO DISCHARGE:  1.  Sodium 139, potassium 4.4, chloride 109, bicarbonate 24, BUN 9, creatinine 0.97, glucose 83, and calcium of 9.0.  Serum cortisol is within normal limits at 12.2.  2.  WBC is 8.3, hemoglobin 12.0,  hematocrit 37.2, platelet count 254,000.  3.  HbA1c is 5.6.   BRIEF HOSPITAL COURSE: Miss Arend is an 81 year old African-American female with past medical history significant for hypertension, diabetes, Raynaud disease, gout, history of renal cell cancer, peripheral vascular disease, primary biliary cirrhosis, chronic pain, presented from assisted living facility secondary to weakness and left foot ulcer and cellulitis. Wound cultures were growing MRSA, Klebsiella, and Staph haemolyticus.     1.  Left foot cellulitis and arterial ulceration. Seen by ID and also podiatry, they recommended vascular consult. She had superficial debridement done by podiatry of her left foot ulcers and Dr. Delana Meyer has seen the patient, did an angiogram and cleared up the atherosclerotic vessels and revascularization was done. There has been improvement of her foot ulcers after the revascularization. She was on vancomycin and Rocephin here in the hospital, being discharged on doxycycline and Cipro orally for 4-6 weeks and to be seen by Dr. Ola Spurr again in the office. The patient will also need angiogram of her right  leg which was discussed with the patient and family and she will follow up with vascular to get it done as an outpatient. She is being discharged on Plavix and she also has neuropathic pain from poor circulation and is on gabapentin.  2.  Hypoglycemia.  The patient's blood sugars when checked from her fingers are extremely low, these are not reliable because of her Raynaud syndrome. They have been checking her earlobe sticks for sugars. Initially when the sugars from  fingersticks were low she was started on D5, however was seen by endocrinology and she is just recommended not to have her fingersticks done from her fingers and can be done from earlobe, if there is a concern of low sugar from the earlobe stick then we can do a venous draw to confirm that. Part of it could be she was having poor appetite here and her appetite has improved now and she is eating better. If her sugars on venous draw are low then we can check for insulin level, C-peptide level, and have endocrinology follow up.  3.  Hypertension.  Her home medications were continued.  4.  The patient worked with physical therapy, they recommended return back to assisted living facility with home health services.   CODE STATUS: The patient is Do Not Resuscitate.   DISCHARGE CONDITION: Guarded.   DISCHARGE DISPOSITION: Assisted living facility with home health services.   TIME SPENT ON DISCHARGE: 40 minutes.    ____________________________ Gladstone Lighter, MD rk:bu D: 09/22/2014 15:29:14 ET T: 09/22/2014 15:58:16 ET JOB#: [id number]  cc: Gladstone Lighter, MD, <Dictator> Gladstone Lighter MD ELECTRONICALLY SIGNED 09/25/2014 17:09

## 2014-12-17 NOTE — Consult Note (Signed)
Brief Consult Note: Diagnosis: Atherosclerosis bilateral lower extremities with left heel ulcer.   Patient was seen by consultant.   Recommend to proceed with surgery or procedure.   Comments: continue to treat heel cellulitis  She will need an angiogram this coming week with the hope for intervention for limb salvage.  Electronic Signatures: Hortencia Pilar (MD)  (Signed 29-Jan-16 20:18)  Authored: Brief Consult Note   Last Updated: 29-Jan-16 20:18 by Hortencia Pilar (MD)

## 2014-12-17 NOTE — Consult Note (Signed)
PATIENT NAME:  [name], EVERHART MR#:  [id number] DATE OF BIRTH:  1928/05/19  DATE OF CONSULTATION:  09/22/2014  REQUESTING PHYSICIAN:  Gladstone Lighter, MD  CONSULTING PHYSICIAN:  A. Lavone Orn, MD  CHIEF COMPLAINT: Hypoglycemia.   HISTORY OF PRESENT ILLNESS: This is an 81 year old female who was admitted on 09/14/2014 for left lower extremity ulceration and altered mental status. She has had a prolonged hospitalization, during which she has received wound care and treatment for ulceration and heel pain. She has been found to have several low blood sugars on fingerstick blood sugar monitoring.  Blood sugars noted to be as low as in the 30s.  No venous blood sugars have been low. She has a history of Raynaud's phenomenon. The patient hospitalized in June 2014 as well. During that hospitalization, also had low blood sugars on fingerstick blood sugar testing. The patient does not recall having had any episodes of feeling weak, hungry or shaky. She feels in in usual state of health.  During the last hospitalization, I was concerned this was due to her Raynaud's and that the fingerstick blood sugars were erroneous.  At that time hemoglobin A1c was 5.4% and her current A1c is 5.6%.  She has not taken and medications associated with hypoglycemia including sulfonylureas or insulin.  She reports appetite as fair. No nausea. She did have diarrhea which has since resolved.  She had a morning cortisol drawn yesterday which was 12.2 ug/dl.  No recent use of glucocorticoids.   PAST MEDICAL HISTORY:  1.  Coronary artery disease.  2.  Hypertension.  3.  Raynaud's. 4.  History of glaucoma.  5.  History of gout.  6.  History of renal cell carcinoma.  7.  History of ischemic colitis.  8.  Peripheral vascular disease.  9.  Primary biliary cirrhosis.   PAST SURGICAL HISTORY:  1.  Tonsillectomy.  2.  Cataract surgery.  3.  Hysterectomy.  4.  Back surgery.   ALLERGIES: No known drug allergies.   SOCIAL  HISTORY: The patient lives at [facility] assisted living. No tobacco or alcohol use has been reported.   FAMILY HISTORY: No known diabetes.   CURRENT MEDICATIONS:  1.  Allopurinol 100 mg daily.  2.  Vitamin D 1000 units daily.  3.  B12 1000 mcg daily.  4.  Senokot 50-8.6, 1 tab daily.  5.  Cymbalta 60 mg daily.  6.  Gabapentin 300 mg b.i.d.  7.  Metoprolol ER 25 mg daily.  8.  Montelukast 10 mg daily.  9.  Ursodiol 1 capsule twice daily.  10. Lovenox 40 mg subcutaneous daily.  11. Fluticasone 44 mcg 2 puffs b.i.d.  12. Levothyroxine 0.025 mg each morning.  13. Gabapentin 300 mg b.i.d.  14. Topamax 25 mg at bedtime.  15. Vancomycin 1 gram every 24 hours.   REVIEW OF SYSTEMS: GEN: No weight loss. No fever. HEENT: No blurred vision. No sore throat. NECK: No neck tenderness. CV: No chest pain or palpitations. PULM: No cough or shortness of breath. ABD: No abdominal pain or nausea or vomiting.  EXT: Denies leg swelling. SKIN: Denies rash or pruritus. ENDO: Denies heat and cold intolerance. MSK: No unusual arthralgias or myalgias.  NEURO: No headaches or recent falls.   PHYSICAL EXAMINATION:  VITAL SIGNS: Height 62.9 inches, weight 148 pounds, temperature 98.0, pulse 80, respirations 18, blood pressure 138/76.  GENERAL: Well-developed African American female in no acute distress.  HEENT: Extraocular movements are intact oropharynx is clear.  NECK: Supple. No thyromegaly.  No thyroid bruit.  LYMPH: No submandibular anterior cervical lymphadenopathy.  CARDIAC: Regular rate and rhythm without murmur.  PULMONARY: Clear to auscultation bilaterally.  ABDOMEN: Diffusely soft, nontender, nondistended.  EXTREMITIES: No peripheral edema noted.  SKIN: Left lower extremity wrapped and dressing is clean and dry. Wound not examined.  PSYCHIATRIC: Alert, oriented, calm, cooperative.   LABORATORY DATA: Glucose 83, BUN 9, creatinine 0.97, sodium 139, potassium 4.4, bicarbonate 24, calcium  9.0.   ASSESSMENT: An 81 year old female with concern for hypoglycemia on fingerstick blood sugar testing; however, this is likely erroneous due to underlying Raynaud's and peripheral vascular disease.   RECOMMENDATIONS:  1.  No need for persistent fingerstick blood sugar monitoring as she has not had documented true hypoglycemia.  2.  If blood sugars do need to be checked, would rely on venous blood sugar checks if possible. The other alternative is a fingerstick check on the ear lobe, which nursing is aware of and currently implementing.  3.  Encourage good nutrition and avoiding skipping meals.  4.  No evidence of adrenal insufficiency.  5.  Should patient have a true low blood sugar (less than 70) on peripheral blood sugar testing, would then follow this up with a serum glucose, cortisol, insulin, C-peptide, and beta hydroxybutyrate. These labs have been ordered, although do not need to be drawn prior to discharge if she does not have true concerns for hypoglycemia.   Thank you for the kind request for consultation.      ____________________________ A. Lavone Orn, MD ams:DT D: 09/22/2014 13:01:08 ET T: 09/22/2014 13:45:32 ET JOB#: [id number]  cc: A. Lavone Orn, MD, <Dictator> Sherlon Handing MD ELECTRONICALLY SIGNED 10/09/2014 21:04

## 2014-12-17 NOTE — Op Note (Signed)
PATIENT NAME:  [name], [name] MR#:  [id number] DATE OF BIRTH:  [birth year]  DATE OF PROCEDURE:  09/20/2014  PREOPERATIVE DIAGNOSIS: Atherosclerotic occlusive disease bilateral lower extremities, with ulceration of the left heel.   POSTOPERATIVE DIAGNOSIS: Atherosclerotic occlusive disease bilateral lower extremities, with ulceration of the left heel.   PROCEDURE PERFORMED:  1.  Abdominal aortogram.  2.  Left lower extremity distal runoff, third order catheter placement.  3.  Crosser atherectomy of the left peroneal artery.  4.  Percutaneous transluminal angioplasty of the left peroneal artery to 3 mm.  5.  Percutaneous transluminal angioplasty of the left superficial femoral artery to 5 mm using the Lutonix balloon.   SURGEON: Hortencia Pilar, MD.    SEDATION: Versed plus fentanyl.   ACCESS: A 6 French sheath right common femoral artery.   FLUOROSCOPY TIME: 11.3 minutes.   CONTRAST USED: Isovue 72 mL.   INDICATIONS: Ms. Boffa is an 81 year old woman admitted to the hospital with cellulitis and ulceration of the left heel. She is found to have atherosclerotic occlusive disease clinically with nonpalpable popliteal and pedal pulses. Risks and benefits for angiography with the plan for intervention for a limb salvage was reviewed. The patient agrees to proceed.   DESCRIPTION OF PROCEDURE: The patient is taken to the special procedure suite, placed in the supine position. After adequate sedation is achieved right groin is prepped and draped in a sterile fashion. Ultrasound is placed in a sterile sleeve. Ultrasound is utilized secondary to lack of appropriate landmarks and to avoid vascular injury. Under real time visualization the common femoral artery is identified. It is echolucent and pulsatile indicating patency. Image is recorded for the permanent record. Under real time ultrasound guidance microneedle is inserted, microwire followed by micro sheath, J-wire followed by a 5 French sheath  and 5 French pigtail catheter. Pigtail catheter is advanced to T12 and AP projection of the aorta is obtained. Pigtail catheter is repositioned to above the bifurcation and an RAO projection of the pelvis is obtained. A stiff angled Glidewire and pigtail catheter are then used to cross the bifurcation. Catheter is advanced down and an LAO projection of the left groin is obtained. The catheter is then positioned in the SFA and distal runoff is obtained. Several high-grade lesions are noted in the SFA and then there is occlusion of the tibial peroneal trunk. There appears to be occlusion of the posterior tibial as well as the anterior tibial throughout their courses. There is reconstitution of the peroneal in its midportion which is patent down to the foot with collaterals filling the dorsalis pedis.   4000 units of heparin were given. Stiff wire was reintroduced and a 6 [place] Raabe sheath was advanced up and over the bifurcation, positioned with its tip in the SFA. Straight catheter was then utilized over the wire to obtain improved images of the tibials and the above noted runoff was defined. An 0.018 wire is then introduced into the stump of the peroneal and an angled Usher is advanced over the 0.018.  The Crosser S6 catheter is then advanced through the Usher and used to cross the 10 cm occlusion within the peroneal. Hand injection of contrast through the Usher catheter verifies intraluminal placement in the peroneal distally and the 0.018 wire is reintroduced. A 3 x 22 balloon is then used to angioplasty the peroneal. A 4 x 6 Lutonix balloon is used to angioplasty the distal popliteal and a 5 x 15 with a 5 x 6 balloon, both of which  were Lutonix, are used to angioplasty the SFA. All inflations of Lutonix balloons were to 12-14 atmospheres for 3 minutes. The 3 mm balloon was inflated to 16 atmospheres for 2 minutes. Followup imaging demonstrates there is now patency of the SFA, as well as the distal popliteal  and peroneal and there is therefore now continuous flow from the femoral down to the distal SFA filling the dorsalis pedis.   The catheter was then pulled into the external iliac on the right, oblique view obtained and a StarClose device deployed without difficulty. There are no immediate complications.   INTERPRETATION: Imaging of the aorta, bilateral common and external iliac arteries demonstrates diffuse heavy calcific disease, however there are no hemodynamically significant stenoses.   The left common femoral and profunda femoris are patent, but again diffuse heavy calcific disease is noted throughout. The SFA demonstrates several lesions which are greater than 75% through its midportion. The SFA at the level of [name] canal and the above-knee popliteal appears to be relatively spared. Distally the popliteal is diffusely diseased, and there is extensive tibial vessel disease as described above with occlusion of all 3 tibials at the level of the trifurcation. The only tibial that reconstitutes is the peroneal.   Following Crosser atherectomy and then 3 mm inflation there is now patency of the peroneal in its entirety. Following angioplasty of the distal popliteal as well as the midportion of the SFA with the Lutonix balloons there is now patency with continuous flow from the femoral down to the ankle through the peroneal.   SUMMARY: Successful recanalization of the left lower extremity arterial system as described above.    ____________________________ Katha Cabal, MD ggs:bu D: 09/20/2014 17:11:16 ET T: 09/20/2014 20:04:21 ET JOB#: [id number]  cc: Katha Cabal, MD, <Dictator> Katha Cabal MD ELECTRONICALLY SIGNED 10/24/2014 14:23

## 2014-12-17 NOTE — Consult Note (Signed)
General Aspect ischemic ulceration left heel   Present Illness The patient is an 81 year old female with multiple medical problems who presented yesterday with genralized malaise.  The day before admission a wound culture of the left foot was taken and she was givne an rx for pain medication.  She began having episodes of  nausea and vomiting associated with 2 days of diarrhea. She noticed an ulcer on her foot 1 month ago, is following currently at the wound care center. In the ER she was found to have an elevated white count, borderline troponin, and low sugar. Wound culture grew out Klebsiella pneumoniae sensitive to most antibiotics.    PAST MEDICAL HISTORY: Heart disease, hypertension, diabetes diet controlled, Raynaud phenomenon, glaucoma, gout, history of renal cell cancer, ischemic colitis, peripheral vascular disease, primary biliary cirrhosis, on numerous psychiatric medications, and chronic pain medications.   PAST SURGICAL HISTORY: Back surgery, tonsils, cataracts, hysterectomy.   Home Medications: Medication Instructions Status  clopidogrel 75 mg oral tablet 1 tab(s) orally once a day (900 am) Active  DULoxetine 60 mg oral delayed release capsule 1 cap(s) orally once a day (9 am) **do not crush** Active  morphine 30 mg/12 hr oral tablet, extended release 1 tab(s) orally 2 times a day (control) (9am, 9pm) **do not crush** Active  Flovent CFC free 44 mcg/inh inhalation aerosol 2 puff(s) inhaled 2 times a day Active  furosemide 20 mg oral tablet 1 tab(s) orally once a day Active  morphine 15 mg oral tablet 1 tab(s) orally once a day, As Needed Active  montelukast 10 mg oral tablet 1 tab(s) orally once a day at (9pm). Active  pantoprazole 40 mg oral delayed release tablet 1 tab(s) orally once a day. Active  topiramate 50 mg oral tablet 1 tab(s) orally once a day (at bedtime) Active  gabapentin 600 mg oral tablet 1 tab(s) orally 3 times a day Active  Metoprolol Succinate ER 25 mg oral  tablet, extended release 1 tab(s) orally once a day Active  Senexon-S 50 mg-8.6 mg oral tablet 1 tab(s) orally once a day Active  amitriptyline 25 mg oral tablet 1 tab(s) orally once a day (at bedtime) Active  Vitamin B12 1000 mcg oral tablet 1 tab(s) orally once a day Active  guaiFENesin 600 mg oral tablet, extended release 1 tab(s) orally every 12 hours, As Needed Active  ursodiol 300 mg oral capsule 1 cap(s) orally 2 times a day (9 am, 9 pm) Active  ProAir HFA 90 mcg/inh inhalation aerosol 2 puff(s) inhaled every 4 hours as needed for shortness of breath Active  Vitamin D3 1000 intl units oral tablet 2 tabs (2000iu) orally once a day (9 am) Active  potassium chloride 10 mEq oral tablet, extended release 1 tab(s) orally once a day (9 am) **do not crush** Active  levothyroxine 25 mcg (0.025 mg) oral tablet 1 tab(s) orally once a day (6 am) Active  allopurinol 100 mg oral tablet 1 tab(s) orally once a day (9 am) Active    Ace Inhibitors: Cough  Septra: N/V/Diarrhea  Case History:  Family History Non-Contributory   Social History negative tobacco, negative ETOH, negative Illicit drugs   Review of Systems:  ROS No TIA/stroke/seizure No heat or cold intolerance No dysuria/hematuria No blurry or double vision No tinnitus or ear pain No rashes or ulcer No suicidal ideation or psychosis No signs of bleeding or easy bruising No SOB/DOE, orthopnea, or sputum No palpitations or chest pain No N/V/D or abdominal pain No joint pain  or joint swelling No fever or chills No unintentional weight loss or gain   Physical Exam:  GEN well developed, well nourished, ill appearing   HEENT hearing intact to voice, moist oral mucosa   NECK supple  trachea midline   RESP normal resp effort  no use of accessory muscles   CARD regular rate  no JVD   ABD denies tenderness   EXTR positive edema, right pop 1+ left pop nonpalpable, pedal pulses nonpalp bilaterally   SKIN No rashes, positive  ulcers, left heel ulcer little or no granualtion present   NEURO cranial nerves intact, follows commands, motor/sensory function intact   PSYCH poor insight, lethargic   Nursing/Ancillary Notes: **Vital Signs.:   29-Jan-16 00:56  Vital Signs Type Q 4hr  Temperature Temperature (F) 98.1  Celsius 36.7  Temperature Source oral  Pulse Pulse 86  Systolic BP Systolic BP 716  Diastolic BP (mmHg) Diastolic BP (mmHg) 47  Mean BP 71  Pulse Ox % Pulse Ox % 93  Pulse Ox Activity Level  At rest  Oxygen Delivery Room Air/ 21 %    10:58  Vital Signs Type Routine  Temperature Temperature (F) 98.4  Celsius 36.8  Pulse Pulse 84  Respirations Respirations 18  Systolic BP Systolic BP 967  Diastolic BP (mmHg) Diastolic BP (mmHg) 64  Mean BP 81  Pulse Ox % Pulse Ox % 99  Pulse Ox Activity Level  At rest  Oxygen Delivery Room Air/ 21 %   Routine Chem:  29-Jan-16 05:44   Hemoglobin A1c ([facility]) 5.6 (The American Diabetes Association recommends that a primary goal of therapy should be <7% and that physicians should reevaluate the treatment regimen in patients with HbA1c values consistently >8%.)  Glucose, Serum 86  BUN 14  Creatinine (comp) 1.19  Sodium, Serum 140  Potassium, Serum 3.7  Chloride, Serum  110  CO2, Serum 23  Calcium (Total), Serum  8.4  Anion Gap 7  Osmolality (calc) 279  eGFR (African American)  56  eGFR (Non-African American)  46 (eGFR values <53m/min/1.73 m2 may be an indication of chronic kidney disease (CKD). Calculated eGFR, using the MRDR Study equation, is useful in  patients with stable renal function. The eGFR calculation will not be reliable in acutely ill patients when serum creatinine is changing rapidly. It is not useful in patients on dialysis. The eGFR calculation may not be applicable to patients at the low and high extremes of body sizes, pregnant women, and vegetarians.)  Routine Hem:  29-Jan-16 05:44   WBC (CBC)  13.7  RBC (CBC)  3.69  Hemoglobin  (CBC)  10.9  Hematocrit (CBC)  34.1  Platelet Count (CBC) 278  MCV 92  MCH 29.6  MCHC 32.0  RDW  16.2  Neutrophil % 69.0  Lymphocyte % 21.6  Monocyte % 7.0  Eosinophil % 1.8  Basophil % 0.6  Neutrophil #  9.5  Lymphocyte # 3.0  Monocyte #  1.0  Eosinophil # 0.2  Basophil # 0.1 (Result(s) reported on 15 Sep 2014 at 06:28AM.)   XRay:    28-Jan-16 14:11, Foot Left AP and Lateral  Foot Left AP and Lateral   REASON FOR EXAM:    ulcer left foot  COMMENTS:       PROCEDURE: DXR - DXR FOOT LEFT AP AND LATERAL  - Sep 14 2014  2:11PM     CLINICAL DATA:  Two week history of dorsal foot pain. Diabetes  mellitus    EXAM:  LEFT FOOT -  2 VIEW    COMPARISON:  None.    FINDINGS:  Frontal and lateral views were obtained. There is a flexion  deformity at the first PIP joint. No fracture or dislocation. There  is osteoarthritic change in the first MTP joint and in all PIP and  DIP joints. There is spurring in the dorsal midfoot. No erosive  change or bony destruction. Bones are osteoporotic.     IMPRESSION:  Bones osteoporotic. Multifocal osteoarthritic change. No fracture or  dislocation.      Electronically Signed    By: Lowella Grip M.D.    On: 09/14/2014 14:40       Verified By: Leafy Kindle. WOODRUFF, M.D.,    Impression 1.  ASO with left heel ulceration, the heel wound is associated with leukocytosis, Clx + Klebsiella growing out of the culture. Patietn on IV Abx, get podiatry consultation.  She does not have palpable pedal pulses and therefore has significant PAD.  She will need revascularization of her left leg to optimize wound healing.  I will try to add this on for Tuesday.  Further debridement of the heel will be based on successful revascularization 2.  Hypoglycemia with history of diabetes. I will check a hemoglobin A1c. start D5 in the IV fluids and continue to monitor closely and check a hemoglobin A1c.  3.  Acute encephalopathy, likely secondary to all these pain  medications the patient is on.  I will give p.r.n. oxycodone for pain and hold the long acting morphine and the large dose of the MSIR, hold the gabapentin, discontinue the amitriptyline, decreased the Topamax dose. 4.  Nausea, vomiting, and diarrhea, could be chronic for this patient but I am unclear. We will send off a stool for Clostridium difficile.   5.  Elevated troponin, likely from infection and demand ischemia. We will put on off unit telemetry and get serial cardiac enzymes.  6.  Hypothyroidism. Continue levothyroxine.  7.  Hypertension. Continue metoprolol.  8.  Gastroesophageal reflux disease without esophagitis. continue PPI 9.  Primary biliary cirrhosis on high-dose ursodiol.   Plan level 4 consult   Electronic Signatures: Hortencia Pilar (MD)  (Signed 30-Jan-16 15:41)  Authored: General Aspect/Present Illness, Home Medications, Allergies, History and Physical Exam, Vital Signs, Labs, Radiology, Impression/Plan   Last Updated: 30-Jan-16 15:41 by Hortencia Pilar (MD)

## 2014-12-17 NOTE — Consult Note (Signed)
PATIENT NAME:  [name], [name] MR#:  [id number] DATE OF BIRTH:  08/07/1928  DATE OF CONSULTATION:  09/14/2014  REFERRING PHYSICIAN:   CONSULTING PHYSICIAN:  Sharlotte Alamo, DPM  REASON FOR CONSULTATION:  Miss Demby is an 81 year old female recently admitted with some nausea and vomiting and body aches all over. She has had a wound on her left heel which she states has been present for a week and a half although previous history states for about a month. She has been followed for this at the wound care center. She was admitted with an elevated white count. A culture previously taken skin has grown out Klebsiella and strap. The patient also has peripheral vascular disease with a history of Raynaud's and has previously been seen over at [facility].   PAST MEDICAL HISTORY:  1. Heart disease.  2. Hypertension.  3. Diet-controlled diabetes.  4. Peripheral vascular disease with Raynaud phenomenon.  5. Gout.  6. History of renal cell cancer.  7. Glaucoma.  8. Ischemic colitis.  9. Primary biliary cirrhosis.   PAST SURGICAL HISTORY: Back surgery, tonsillectomy, cataracts, hysterectomy.   MEDICATIONS: Allopurinol 100 mg daily, amitriptyline 25 mg at bedtime, Plavix 75 mg daily, Dulcolax 60 mg daily, Flovent CFC 44 mcg 2 puffs twice a day, furosemide 20 mg daily, gabapentin 600 mg 3 times a day, guaifenesin 600 mg q. 12 h, levothyroxine 25 mcg daily, metoprolol ER 25 mg daily, Singulair 10 mg daily, morphine 50 mg once a day as needed, morphine 30 mg q. 12 hours, Protonix 40 mg daily, potassium chloride 10 mEq daily, ProAir 2 puffs every 2 hours, Topamax 50 mg at bedtime, ursodiol 300 mg twice a day, vitamin B12 1000 mcg daily, vitamin D 10,000 international units 2 tablets daily.   ALLERGIES: No known drug allergies.   FAMILY HISTORY: Cancer.   SOCIAL HISTORY: Lives at [facility] assisted living. Denies alcohol or tobacco use.   REVIEW OF SYSTEMS:  CONSTITUTIONAL: Significant for  fever and chills today with positive night sweats, some weight loss, and fatigue.  EYES: No blurry or double vision.  EARS, NOSE, AND THROAT:  Some hearing loss. Some nasal discharge. No difficulty swallowing.  CARDIOVASCULAR: No chest pain or palpitations.  RESPIRATORY: No shortness of breath or cough.   GASTROINTESTINAL: Some nausea and vomiting this a.m.  Also positive for diarrhea.  GENITOURINARY: No dysuria or incontinence.  MUSCULOSKELETAL: Generalized body aches. Some pain in her left heel area and ankle.  INTEGUMENT: Some swelling and discoloration around the ulcer on her left ankle. She has had some drainage.  NEUROLOGICAL: Does not relate any numbness or paresthesias.   PHYSICAL EXAMINATION:  VASCULAR: DP pulse is thready and not consistently palpable bilateral. PT pulse could not be fully palpated. Capillary filling time does appear to be intact.  NEUROLOGICAL: Epicritic sensations appear to be grossly intact bilateral.  INTEGUMENT: The skin is warm, dry, and somewhat atrophic with absent hair growth. There is some edema and hyperpigmentation along the lateral aspect of the left heel and ankle. A large full-thickness ulceration with a dry base of fibrotic tissue is present on the lateral heel which measures approximately 2-2.5 cm diameter. No purulence could be expressed and there was no drainage upon debridement. There is a more superficial ulceration over the posterior aspect of the left ankle over the Achilles tendon area. Some mild drainage, but no gross abscess or cellulitis. A full thickness but dry ulceration present on the dorsal aspect of the left hallux which  measures approximately 3 mm diameter. Two very small ulcerations are present on the dorsal aspect of the right 2nd and 3rd toes.   MUSCULOSKELETAL: Some digital contractures are noted. There is some pain on palpation. Muscle testing is deferred.   X-RAYS: Two views of the left foot reveal no clear evidence of any cortical  destruction or signs of osteomyelitis around the heel. No subcutaneous gas is noted. No evidence of fracture or dislocation.   LABORATORY WORK: White count is significantly elevated at 18,000. Sedimentation rate elevated at 126.   IMPRESSION:  1. Full thickness ulceration left heel with no significant drainage.  2. Multiple other more superficial ulcerations.  3. Peripheral vascular disease with Raynaud's.   4. Diet-controlled diabetes.   PLAN: Very superficial debridement was performed around the ulceration on the left heel sharply using tissue nippers. This was an excisional debridement. Some very mild debridement was performed along the fibrotic base of the ulceration which was full thickness through the subcutaneous tissues. A moistening dressing was applied with SAF-Gel followed by a dry gauze. We will perform daily dressing changes and continue on antibiotics. Consult vascular surgery as it does have a significant vascular component to it. At this point the wound is relatively dry and does not clearly show abscess or need for an urgent type of debridement. Await vascular results and we will monitor her accordingly at this point.    ____________________________ Sharlotte Alamo, DPM tc:bu D: 09/14/2014 20:02:54 ET T: 09/14/2014 20:21:50 ET JOB#: [id number]  cc: Sharlotte Alamo, DPM, <Dictator> Amrie Gurganus DPM ELECTRONICALLY SIGNED 09/28/2014 9:48

## 2014-12-18 ENCOUNTER — Encounter: Payer: Self-pay | Admitting: Surgery

## 2014-12-18 ENCOUNTER — Encounter: Payer: Medicaid Other | Attending: Surgery | Admitting: Surgery

## 2014-12-18 DIAGNOSIS — L97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: Secondary | ICD-10-CM | POA: Diagnosis not present

## 2014-12-18 DIAGNOSIS — I70244 Atherosclerosis of native arteries of left leg with ulceration of heel and midfoot: Secondary | ICD-10-CM | POA: Insufficient documentation

## 2014-12-18 DIAGNOSIS — R600 Localized edema: Secondary | ICD-10-CM | POA: Diagnosis not present

## 2014-12-18 DIAGNOSIS — L97412 Non-pressure chronic ulcer of right heel and midfoot with fat layer exposed: Secondary | ICD-10-CM | POA: Insufficient documentation

## 2014-12-18 DIAGNOSIS — I70235 Atherosclerosis of native arteries of right leg with ulceration of other part of foot: Secondary | ICD-10-CM | POA: Insufficient documentation

## 2014-12-18 DIAGNOSIS — L97529 Non-pressure chronic ulcer of other part of left foot with unspecified severity: Secondary | ICD-10-CM | POA: Diagnosis present

## 2014-12-19 ENCOUNTER — Encounter: Payer: Medicaid Other | Admitting: Surgery

## 2014-12-19 DIAGNOSIS — L97529 Non-pressure chronic ulcer of other part of left foot with unspecified severity: Secondary | ICD-10-CM | POA: Diagnosis not present

## 2014-12-19 NOTE — Progress Notes (Signed)
[name], [name] ([id number]) Visit Report for 12/18/2014 HBO Details Patient Name: [name] Date of Service: 12/18/2014 8:00 AM Medical Record Number: [id number] Patient Account Number: [id number] Date of Birth/Sex: 11-17-1927 (81 y.o. 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, Peripheral HBO Treatment Details Treatment Number: 15 Patient Type: Outpatient Chamber Type: Monoplace Chamber #: [id number] Treatment Protocol: 2.0 ATA with 90 minutes oxygen, and no air breaks Treatment Details Compression Rate Down: 1.5 psi / minute De-Compression Rate Up: 1.5 psi / minute Air breaks and breathing Compress Tx Pressure Decompress Decompress periods Begins Reached Begins Ends (leave unused spaces blank) Chamber Pressure 1 ATA 2.0 ATA - - - - - - 2.0 ATA 1 ATA Clock Time (24 hr) 08:16 08:28 - - - - - - 09:58 10:08 Treatment Length: 112 (minutes) Treatment Segments: 4 Capillary Blood Glucose Pre Capillary Blood Glucose (mg/dl): Post Capillary Blood Glucose (mg/dl): Vital Signs Capillary Blood Glucose Reference Range: 80 - 120 mg / dl HBO Diabetic Blood Glucose Intervention Range: <131 mg/dl or >249 mg/dl Time Vitals Blood Respiratory Capillary Blood Glucose Pulse Action Type: Pulse: Temperature: Taken: Pressure: Rate: Glucose (mg/dl): Meter #: Oximetry (%) Taken: Pre 07:50 112/58 72 18 97.8 Post 10:15 124/70 72 18 95.2 Treatment Response [name] ([id number]) Treatment Toleration: Treatment Treatment Completed without Adverse Event Completion Status: HBO Attestation I  certify that I supervised this HBO treatment in accordance with Medicare guidelines. A trained Yes emergency response team is readily available per hospital policies and procedures. Continue HBOT as ordered. Yes Electronic Signature(s) Signed: 12/18/2014 1:00:22 PM By: Christin Fudge MD, FACS Entered By: Christin Fudge on 12/18/2014 12:31:39 [name], [name] ([id number]) -------------------------------------------------------------------------------- HBO Safety Checklist Details Patient Name: [name] Date of Service: 12/18/2014 8:00 AM Medical Record Number: [id number] Patient Account Number: [id number] Date of Birth/Sex: 07/15/1928 (81 y.o. Female) Treating RN: Primary Care Physician: Constance Goltz Other Clinician: Referring Physician: Constance Goltz Treating Physician/Extender: Suella Grove in Treatment: 13 HBO Safety Checklist Items Safety Checklist Consent Form Signed Patient voided / foley secured and emptied When did you last eato 07:00 am Last dose of injectable or oral agent n/a Ostomy pouch emptied and vented if applicable n/a All implantable devices assessed, documented and approved n/a Intravenous access site secured and place n/a Valuables secured Linens and cotton and cotton/polyester blend (less than 51% polyester) Personal oil-based products / skin lotions / body lotions removed Wigs or hairpieces removed Smoking or tobacco materials removed Books / newspapers / magazines / loose paper removed Cologne, aftershave, perfume and deodorant removed Jewelry removed (may wrap wedding band) Make-up removed Hair care products removed Battery operated devices (external) removed Heating patches and chemical warmers removed Titanium eyewear removed Nail polish cured greater than 10 hours n/a Casting material cured greater than 10 hours n/a Hearing aids removed n/a Loose dentures or partials removed Prosthetics have been removed n/a Patient demonstrates correct use of air  break device (if applicable) Patient concerns have been addressed Patient grounding bracelet on and cord attached to chamber Specifics for Inpatients (complete in addition to above) Medication sheet sent with patient Intravenous medications needed or due during therapy sent with patient [name], [name] ([id number]) Drainage tubes (e.g. nasogastric tube or chest tube secured and vented) Endotracheal or  Tracheotomy tube secured Cuff deflated of air and inflated with saline Airway suctioned Electronic Signature(s) Signed: 12/18/2014 3:39:15 PM By: Lorine Bears RCP, RRT, CHT Entered By: Becky Sax, Amado Nash on 12/18/2014 10:58:12

## 2014-12-19 NOTE — Progress Notes (Signed)
[name], [name] ([id number]) Visit Report for 12/19/2014 Arrival Information Details Patient Name: [name], [name] Date of Service: 12/19/2014 8:00 AM Medical Record Number: [id number] Patient Account Number: [id number] Date of Birth/Sex: 08-17-1928 (80 y.o. Female) Treating RN: Primary Care Physician: Constance Goltz Other Clinician: Jacqulyn Bath Referring Physician: Constance Goltz Treating Physician/Extender: Frann Rider in Treatment: 14 Visit Information History Since Last Visit Added or deleted any medications: No Patient Arrived: Wheel Chair Any new allergies or adverse reactions: No Arrival Time: 07:50 Had a fall or experienced change in No Accompanied By: Delfino Lovett, activities of daily living that may affect transport risk of falls: Transfer Assistance: Manual Signs or symptoms of abuse/neglect since last No Patient Identification Verified: Yes visito Secondary Verification Process Yes Hospitalized since last visit: No Completed: Has Dressing in Place as Prescribed: Yes Patient Requires Transmission- No Pain Present Now: No Based Precautions: Patient Has Alerts: Yes Patient Alerts: Patient on Blood Thinner DMII Plavix ABI L 0.63 R 0.79 Electronic Signature(s) Signed: 12/19/2014 5:01:42 PM By: Lorine Bears RCP, RRT, CHT Entered By: Lorine Bears on 12/19/2014 08:18:59 [name] ([id number]) -------------------------------------------------------------------------------- Encounter Discharge Information Details Patient Name: [name] Date of Service: 12/19/2014 8:00 AM Medical Record Number: [id number] Patient Account Number: [id number] Date of Birth/Sex: 13-Feb-1928 (81 y.o. Female) Treating RN: Primary Care Physician: Constance Goltz Other Clinician: Referring Physician: Constance Goltz Treating Physician/Extender: Frann Rider in Treatment: 14 Encounter Discharge Information Items Discharge Pain Level: 0 Discharge  Condition: Stable Ambulatory Status: Wheelchair Discharge Destination: Nursing Home Transportation: Other [name], Accompanied By: transportation Schedule Follow-up Appointment: No Medication Reconciliation completed and provided to No Patient/Care Kenzel Ruesch: Clinical Summary of Care: Notes Patient has an HBO treatment scheduled on 12/20/14 at 08:00 am. Electronic Signature(s) Signed: 12/19/2014 5:01:42 PM By: Lorine Bears RCP, RRT, CHT Entered By: Lorine Bears on 12/19/2014 10:27:03 [name] ([id number]) -------------------------------------------------------------------------------- Vitals Details Patient Name: [name] Date of Service: 12/19/2014 8:00 AM Medical Record Number: [id number] Patient Account Number: [id number] Date of Birth/Sex: September 21, 1927 (81 y.o. Female) Treating RN: Junious Dresser Primary Care Physician: Constance Goltz Other Clinician: Jacqulyn Bath Referring Physician: Constance Goltz Treating Physician/Extender: Frann Rider in Treatment: 14 Vital Signs Time Taken: 07:50 Temperature (F): 97.1 Height (in): 64 Pulse (bpm): 72 Weight (lbs): 180 Respiratory Rate (breaths/min): 18 Body Mass Index (BMI): 30.9 Blood Pressure (mmHg): 122/70 Reference Range: 80 - 120 mg / dl Electronic Signature(s) Signed: 12/19/2014 5:01:42 PM By: Lorine Bears RCP, RRT, CHT Entered By: Lorine Bears on 12/19/2014 08:19:29

## 2014-12-19 NOTE — Progress Notes (Signed)
[name], [name] ([id number]) Visit Report for 12/18/2014 Arrival Information Details Patient Name: [name], [name] Date of Service: 12/18/2014 8:00 AM Medical Record Number: [id number] Patient Account Number: [id number] Date of Birth/Sex: [birth year] (81 y.o. Female) Treating RN: Montey Hora Primary Care Physician: Constance Goltz Other Clinician: Jacqulyn Bath Referring Physician: Constance Goltz Treating Physician/Extender: Frann Rider in Treatment: 13 Visit Information History Since Last Visit Added or deleted any medications: No Patient Arrived: Wheel Chair Any new allergies or adverse reactions: No Arrival Time: 07:50 Had a fall or experienced change in No Accompanied By: Delfino Lovett, activities of daily living that may affect caregiver risk of falls: Transfer Assistance: Manual Signs or symptoms of abuse/neglect since last No Patient Identification Verified: Yes visito Secondary Verification Process Yes Hospitalized since last visit: No Completed: Has Dressing in Place as Prescribed: Yes Patient Requires Transmission- No Pain Present Now: No Based Precautions: Patient Has Alerts: Yes Patient Alerts: Patient on Blood Thinner DMII Plavix ABI L 0.63 R 0.79 Electronic Signature(s) Signed: 12/18/2014 3:39:15 PM By: Lorine Bears RCP, RRT, CHT Entered By: Lorine Bears on 12/18/2014 10:55:51 [name] ([id number]) -------------------------------------------------------------------------------- Encounter Discharge Information Details Patient Name: [name] Date of Service: 12/18/2014 8:00 AM Medical Record Number: [id number] Patient Account Number: [id number] Date of Birth/Sex: 01/05/1928 (81 y.o. Female) Treating RN: Montey Hora Primary Care Physician: Constance Goltz Other Clinician: Referring Physician: Constance Goltz Treating Physician/Extender: Frann Rider in Treatment: 84 Encounter Discharge Information  Items Discharge Pain Level: 0 Discharge Condition: Stable Ambulatory Status: Wheelchair Nursing Discharge Destination: Home Transportation: Other [name], Accompanied By: transport Schedule Follow-up Appointment: No Medication Reconciliation completed No and provided to Patient/Care Eliberto Sole: Clinical Summary of Care: Notes Patient has an HBO treatment scheduled on 12/19/14 at 08:00 am. Electronic Signature(s) Signed: 12/18/2014 3:39:15 PM By: Lorine Bears RCP, RRT, CHT Entered By: Lorine Bears on 12/18/2014 11:01:13 [name] ([id number]) -------------------------------------------------------------------------------- Vitals Details Patient Name: [name] Date of Service: 12/18/2014 8:00 AM Medical Record Number: [id number] Patient Account Number: [id number] Date of Birth/Sex: [birth year] (81 y.o. Female) Treating RN: Primary Care Physician: Constance Goltz Other Clinician: Referring Physician: Constance Goltz Treating Physician/Extender: Suella Grove in Treatment: 13 Vital Signs Time Taken: 07:50 Temperature (F): 97.8 Height (in): 64 Pulse (bpm): 72 Weight (lbs): 180 Respiratory Rate (breaths/min): 18 Body Mass Index (BMI): 30.9 Blood Pressure (mmHg): 112/58 Reference Range: 80 - 120 mg / dl Electronic Signature(s) Signed: 12/18/2014 3:39:15 PM By: Lorine Bears RCP, RRT, CHT Entered By: Lorine Bears on 12/18/2014 10:07:47

## 2014-12-19 NOTE — Progress Notes (Signed)
SHARLETTA, BOLAN ([id number]) Visit Report for 12/19/2014 HBO Details Patient Name: [name], [name] Date of Service: 12/19/2014 8:00 AM Medical Record Number: [id number] Patient Account Number: [id number] Date of Birth/Sex: 05/14/1928 (81 y.o. 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, Peripheral HBO Treatment Details Treatment Number: 16 Patient Type: Outpatient Chamber Type: Monoplace Chamber #: [id number] Treatment Protocol: 2.0 ATA with 90 minutes oxygen, and no air breaks Treatment Details Compression Rate Down: 1.5 psi / minute De-Compression Rate Up: 2.0 psi / minute Air breaks and breathing Compress Tx Pressure Decompress Decompress periods Begins Reached Begins Ends (leave unused spaces blank) Chamber Pressure 1 ATA 2.0 ATA - - - - - - 2.0 ATA 1 ATA Clock Time (24 hr) 08:10 08:21 - - - - - - 09:51 09:59 Treatment Length: 109 (minutes) Treatment Segments: 4 Capillary Blood Glucose Pre Capillary Blood Glucose (mg/dl): Post Capillary Blood Glucose (mg/dl): Vital Signs Capillary Blood Glucose Reference Range: 80 - 120 mg / dl HBO Diabetic Blood Glucose Intervention Range: <131 mg/dl or >249 mg/dl Time Vitals Blood Respiratory Capillary Blood Glucose Pulse Action Type: Pulse: Temperature: Taken: Pressure: Rate: Glucose (mg/dl): Meter #: Oximetry (%) Taken: Pre 07:50 122/70 72 18 97.1 Post 10:10 124/76 66 18 96.2 Treatment Response Well [place]burn, Quanita ([id number]) Treatment Toleration: Treatment Treatment Completed without Adverse Event Completion Status: HBO Attestation I  certify that I supervised this HBO treatment in accordance with Medicare guidelines. A trained Yes emergency response team is readily available per hospital policies and procedures. Continue HBOT as ordered. Yes Electronic Signature(s) Signed: 12/19/2014 1:44:13 PM By: Christin Fudge MD, FACS Entered By: Christin Fudge on 12/19/2014 10:52:48 VIRNA, CHAUDHARI ([id number]) -------------------------------------------------------------------------------- HBO Safety Checklist Details Patient Name: [name] Date of Service: 12/19/2014 8:00 AM Medical Record Number: [id number] Patient Account Number: [id number] Date of Birth/Sex: 10/20/1927 (81 y.o. Female) Treating RN: Primary Care Physician: Constance Goltz Other Clinician: Referring Physician: Constance Goltz Treating Physician/Extender: Frann Rider in Treatment: 14 HBO Safety Checklist Items Safety Checklist Consent Form Signed Patient voided / foley secured and emptied When did you last eato 07:00 am Last dose of injectable or oral agent n/a Ostomy pouch emptied and vented if applicable n/a All implantable devices assessed, documented and approved n/a Intravenous access site secured and place n/a Valuables secured Linens and cotton and cotton/polyester blend (less than 51% polyester) Personal oil-based products / skin lotions / body lotions removed Wigs or hairpieces removed Smoking or tobacco materials removed Books / newspapers / magazines / loose paper removed Cologne, aftershave, perfume and deodorant removed Jewelry removed (may wrap wedding band) Make-up removed Hair care products removed Battery operated devices (external) removed Heating patches and chemical warmers removed Titanium eyewear removed Nail polish cured greater than 10 hours n/a Casting material cured greater than 10 hours n/a Hearing aids removed n/a Loose dentures or partials removed Prosthetics have been removed n/a Patient demonstrates correct  use of air break device (if applicable) Patient concerns have been addressed Patient grounding bracelet on and cord attached to chamber Specifics for Inpatients (complete in addition to above) Medication sheet sent with patient Intravenous medications needed or due during therapy sent with patient KESIA, FINNEGAN ([id number]) Drainage tubes (e.g. nasogastric tube or chest tube secured and vented)  Endotracheal or Tracheotomy tube secured Cuff deflated of air and inflated with saline Airway suctioned Electronic Signature(s) Signed: 12/19/2014 5:01:42 PM By: Lorine Bears RCP, RRT, CHT Entered By: Lorine Bears on 12/19/2014 08:20:31

## 2014-12-20 ENCOUNTER — Encounter: Payer: Medicaid Other | Admitting: Surgery

## 2014-12-20 DIAGNOSIS — L97529 Non-pressure chronic ulcer of other part of left foot with unspecified severity: Secondary | ICD-10-CM | POA: Diagnosis not present

## 2014-12-20 NOTE — Progress Notes (Signed)
[name], [name] ([id number]) Visit Report for 12/20/2014 HBO Details Patient Name: [name], [name] Date of Service: 12/20/2014 8:00 AM Medical Record Number: [id number] Patient Account Number: [id number] Date of Birth/Sex: 02/09/1928 (81 y.o. 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, Peripheral HBO Treatment Details Treatment Number: 17 Patient Type: Outpatient Chamber Type: Monoplace Chamber #: [id number] Treatment Protocol: 2.0 ATA with 90 minutes oxygen, and no air breaks Treatment Details Compression Rate Down: 1.5 psi / minute De-Compression Rate Up: 1.5 psi / minute Air breaks and breathing Compress Tx Pressure Decompress Decompress periods Begins Reached Begins Ends (leave unused spaces blank) Chamber Pressure 1 ATA 2.0 ATA - - - - - - 2.0 ATA 1 ATA Clock Time (24 hr) 08:12 08:24 - - - - - - 09:55 10:05 Treatment Length: 113 (minutes) Treatment Segments: 4 Capillary Blood Glucose Pre Capillary Blood Glucose (mg/dl): Post Capillary Blood Glucose (mg/dl): Vital Signs Capillary Blood Glucose Reference Range: 80 - 120 mg / dl HBO Diabetic Blood Glucose Intervention Range: <131 mg/dl or >249 mg/dl Time Vitals Blood Respiratory Capillary Blood Glucose Pulse Action Type: Pulse: Temperature: Taken: Pressure: Rate: Glucose (mg/dl): Meter #: Oximetry (%) Taken: Pre 07:55 124/82 66 18 98.1 Post 10:20 128/80 72 18 97.1 Treatment Response Well [name], [name] ([id number]) Treatment Toleration: Treatment Treatment Completed without Adverse Event Completion Status: HBO  Attestation I certify that I supervised this HBO treatment in accordance with Medicare guidelines. A trained Yes emergency response team is readily available per hospital policies and procedures. Continue HBOT as ordered. Yes Electronic Signature(s) Signed: 12/20/2014 4:29:30 PM By: Loletha Grayer MD Entered By: Loletha Grayer on 12/20/2014 11:35:43 [name], [name] ([id number]) -------------------------------------------------------------------------------- HBO Safety Checklist Details Patient Name: [name] Date of Service: 12/20/2014 8:00 AM Medical Record Number: [id number] Patient Account Number: [id number] Date of Birth/Sex: Jan 03, 1928 (81 y.o. Female) Treating RN: Afful, RN, BSN, Velva Harman Primary Care Physician: Constance Goltz Other Clinician: Jacqulyn Bath Referring Physician: Constance Goltz Treating Physician/Extender: Frann Rider in Treatment: 14 HBO Safety Checklist Items Safety Checklist Consent Form Signed Patient voided / foley secured and emptied When did you last eato 07:00 am Last dose of injectable or oral agent n/a Ostomy pouch emptied and vented if applicable n/a All implantable devices assessed, documented and approved n/a Intravenous access site secured and place n/a Valuables secured Linens and cotton and cotton/polyester blend (less than 51% polyester) Personal oil-based products / skin lotions / body lotions removed Wigs or hairpieces removed Smoking or tobacco materials removed Books / newspapers / magazines / loose paper removed Cologne, aftershave, perfume and deodorant removed Jewelry removed (may wrap wedding band) Make-up removed Hair care products removed Battery operated devices (external) removed Heating patches and chemical warmers removed Titanium eyewear removed Nail polish cured greater than 10 hours n/a Casting material cured greater than 10 hours n/a Hearing aids removed n/a Loose dentures or partials  removed Prosthetics have been removed n/a Patient demonstrates correct use of air break device (if applicable) Patient concerns have been addressed Patient grounding bracelet on and cord attached to chamber Specifics for Inpatients (complete in addition to above) Medication sheet sent with patient Intravenous medications needed or due during therapy sent with patient [name], [name] ([id number]) Drainage tubes (  e.g. nasogastric tube or chest tube secured and vented) Endotracheal or Tracheotomy tube secured Cuff deflated of air and inflated with saline Airway suctioned Electronic Signature(s) Signed: 12/20/2014 12:24:58 PM By: Lorine Bears RCP, RRT, CHT Entered By: Lorine Bears on 12/20/2014 08:29:30

## 2014-12-20 NOTE — Progress Notes (Addendum)
MALIN, MEINCKE ([id number]) Visit Report for 12/20/2014 Arrival Information Details Patient Name: [name], [name] Date of Service: 12/20/2014 8:00 AM Medical Record Number: [id number] Patient Account Number: [id number] Date of Birth/Sex: [birth year] (81 y.o. Female) Treating RN: Afful, RN, BSN, Velva Harman Primary Care Physician: Constance Goltz Other Clinician: Jacqulyn Bath Referring Physician: Constance Goltz Treating Physician/Extender: Frann Rider in Treatment: 12 Visit Information History Since Last Visit Added or deleted any medications: No Patient Arrived: Wheel Chair Any new allergies or adverse reactions: No Arrival Time: 07:55 Had a fall or experienced change in No Accompanied By: Delfino Lovett, activities of daily living that may affect transport risk of falls: Transfer Assistance: Manual Signs or symptoms of abuse/neglect since last No Patient Identification Verified: Yes visito Secondary Verification Process Yes Hospitalized since last visit: No Completed: Has Dressing in Place as Prescribed: Yes Patient Requires Transmission- No Pain Present Now: No Based Precautions: Patient Has Alerts: Yes Patient Alerts: Patient on Blood Thinner DMII Plavix ABI L 0.63 R 0.79 Electronic Signature(s) Signed: 12/20/2014 12:24:58 PM By: Lorine Bears RCP, RRT, CHT Entered By: Lorine Bears on 12/20/2014 08:27:18 [name] ([id number]) -------------------------------------------------------------------------------- Encounter Discharge Information Details Patient Name: [name] Date of Service: 12/20/2014 8:00 AM Medical Record Number: [id number] Patient Account Number: [id number] Date of Birth/Sex: 1928-07-31 (81 y.o. Female) Treating RN: Afful, RN, BSN, Velva Harman Primary Care Physician: Constance Goltz Other Clinician: Jacqulyn Bath Referring Physician: Constance Goltz Treating Physician/Extender: Frann Rider in Treatment: 14 Encounter  Discharge Information Items Discharge Pain Level: 0 Discharge Condition: Stable Ambulatory Status: Wheelchair Nursing Discharge Destination: Home Transportation: Other [name], Accompanied By: transport Schedule Follow-up Appointment: No Medication Reconciliation completed No and provided to Patient/Care Kip Cropp: Patient Clinical Summary of Care: Declined Notes Patient has an HBO treatment scheduled on 12/21/14 at 0800 am. Electronic Signature(s) Signed: 12/20/2014 12:25:51 PM By: Ruthine Dose Previous Signature: 12/20/2014 12:24:58 PM Version By: Lorine Bears RCP, RRT, CHT Entered By: Ruthine Dose on 12/20/2014 12:25:50 [name] ([id number]) -------------------------------------------------------------------------------- Vitals Details Patient Name: [name] Date of Service: 12/20/2014 8:00 AM Medical Record Number: [id number] Patient Account Number: [id number] Date of Birth/Sex: [birth year] (81 y.o. Female) Treating RN: Afful, RN, BSN, [place] Primary Care Physician: Constance Goltz Other Clinician: Jacqulyn Bath Referring Physician: Constance Goltz Treating Physician/Extender: Frann Rider in Treatment: 14 Vital Signs Time Taken: 07:55 Temperature (F): 98.1 Height (in): 64 Pulse (bpm): 66 Weight (lbs): 180 Respiratory Rate (breaths/min): 18 Body Mass Index (BMI): 30.9 Blood Pressure (mmHg): 124/82 Reference Range: 80 - 120 mg / dl Electronic Signature(s) Signed: 12/20/2014 12:24:58 PM By: Lorine Bears RCP, RRT, CHT Entered By: Becky Sax, Amado Nash on 12/20/2014 08:28:39

## 2014-12-21 ENCOUNTER — Encounter: Payer: Medicaid Other | Admitting: Surgery

## 2014-12-21 DIAGNOSIS — L97529 Non-pressure chronic ulcer of other part of left foot with unspecified severity: Secondary | ICD-10-CM | POA: Diagnosis not present

## 2014-12-21 NOTE — Progress Notes (Signed)
[name], [name] ([id number]) Visit Report for 12/21/2014 Arrival Information Details Patient Name: [name], [name] Date of Service: 12/21/2014 8:00 AM Medical Record Number: [id number] Patient Account Number: [id number] Date of Birth/Sex: 11-19-1927 (81 y.o. Female) Treating RN: Primary Care Physician: Constance Goltz Other Clinician: Jacqulyn Bath Referring Physician: Constance Goltz Treating Physician/Extender: Frann Rider in Treatment: 14 Visit Information History Since Last Visit Added or deleted any medications: No Patient Arrived: Wheel Chair Any new allergies or adverse reactions: No Arrival Time: 08:00 Had a fall or experienced change in No Accompanied By: Delfino Lovett, activities of daily living that may affect transport risk of falls: Transfer Assistance: Manual Signs or symptoms of abuse/neglect since last No Patient Identification Verified: Yes visito Secondary Verification Process Yes Hospitalized since last visit: No Completed: Has Dressing in Place as Prescribed: Yes Patient Requires Transmission- No Pain Present Now: No Based Precautions: Patient Has Alerts: Yes Patient Alerts: Patient on Blood Thinner DMII Plavix ABI L 0.63 R 0.79 Electronic Signature(s) Signed: 12/21/2014 12:27:55 PM By: Lorine Bears RCP, RRT, CHT Entered By: Lorine Bears on 12/21/2014 08:21:33 [name] ([id number]) -------------------------------------------------------------------------------- Encounter Discharge Information Details Patient Name: [name] Date of Service: 12/21/2014 8:00 AM Medical Record Number: [id number] Patient Account Number: [id number] Date of Birth/Sex: January 30, 1928 (81 y.o. Female) Treating RN: Primary Care Physician: Constance Goltz Other Clinician: Referring Physician: Constance Goltz Treating Physician/Extender: Frann Rider in Treatment: 14 Encounter Discharge Information Items Discharge Pain Level: 0 Discharge  Condition: Stable Ambulatory Status: Wheelchair Nursing Discharge Destination: Home Transportation: Other [name], Accompanied By: transport Schedule Follow-up Appointment: No Medication Reconciliation completed No and provided to Patient/Care Amely Voorheis: Clinical Summary of Care: Notes Patient has an HBO treatment scheduled on 12/22/14 at 08:00 am. Electronic Signature(s) Signed: 12/21/2014 12:27:55 PM By: Lorine Bears RCP, RRT, CHT Entered By: Lorine Bears on 12/21/2014 10:58:38 [name] ([id number]) -------------------------------------------------------------------------------- Vitals Details Patient Name: [name] Date of Service: 12/21/2014 8:00 AM Medical Record Number: [id number] Patient Account Number: [id number] Date of Birth/Sex: 24-Oct-1927 (81 y.o. Female) Treating RN: Primary Care Physician: Constance Goltz Other Clinician: Referring Physician: Constance Goltz Treating Physician/Extender: Frann Rider in Treatment: 14 Vital Signs Time Taken: 08:00 Temperature (F): 98.3 Height (in): 64 Pulse (bpm): 72 Weight (lbs): 180 Respiratory Rate (breaths/min): 18 Body Mass Index (BMI): 30.9 Blood Pressure (mmHg): 150/70 Reference Range: 80 - 120 mg / dl Electronic Signature(s) Signed: 12/21/2014 12:27:55 PM By: Lorine Bears RCP, RRT, CHT Entered By: Becky Sax, Amado Nash on 12/21/2014 08:22:03

## 2014-12-21 NOTE — Progress Notes (Signed)
KAROL, SEGAWA ([id number]) Visit Report for 12/18/2014 HBO Details Patient Name: [name], [name] Date of Service: 12/18/2014 8:00 AM Medical Record Number: [id number] Patient Account Number: [id number] Date of Birth/Sex: 05-09-1928 (81 y.o. 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, Peripheral HBO Treatment Details Chamber Type: Monoplace Patient Type: Outpatient Chamber #: [id number] Treatment Protocol: 2.0 ATA with 90 minutes oxygen, and no air breaks Treatment Details Compression Rate Down: 1.5 psi / minute De-Compression Rate Up: 1.5 psi / minute Air breaks and breathing Compress Tx Pressure Decompress Decompress periods Begins Reached Begins Ends (leave unused spaces blank) Chamber Pressure 1 ATA 2.0 ATA - - - - - - 2.0 ATA 1 ATA Clock Time (24 hr) 08:16 08:28 - - - - - - 09:58 - Treatment Length: (minutes) Treatment Segments: Capillary Blood Glucose Pre Capillary Blood Glucose (mg/dl): Post Capillary Blood Glucose (mg/dl): Vital Signs Capillary Blood Glucose Reference Range: 80 - 120 mg / dl HBO Diabetic Blood Glucose Intervention Range: <131 mg/dl or >249 mg/dl Time Vitals Blood Respiratory Capillary Blood Glucose Pulse Action Type: Pulse: Temperature: Taken: Pressure: Rate: Glucose (mg/dl): Meter #: Oximetry (%) Taken: Pre 07:50 112/58 72 18 97.8 Electronic Signature(s) Signed: 12/18/2014 1:00:22 PM By: Christin Fudge MD, FACS [name], [name] ([id number]) Signed: 12/18/2014 3:39:15 PM By: Lorine Bears RCP, RRT, CHT Entered By: Lorine Bears on  12/18/2014 10:00:56

## 2014-12-21 NOTE — Progress Notes (Signed)
[name], [name] ([id number]) Visit Report for 12/21/2014 HBO Details Patient Name: [name], [name] Date of Service: 12/21/2014 8:00 AM Medical Record Number: [id number] Patient Account Number: [id number] Date of Birth/Sex: [birth year] (81 y.o. 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, Peripheral HBO Treatment Details Treatment Number: 18 Patient Type: Outpatient Chamber Type: Monoplace Chamber #: [id number] Treatment Protocol: 2.0 ATA with 90 minutes oxygen, and no air breaks Treatment Details Compression Rate Down: 1.5 psi / minute De-Compression Rate Up: 1.5 psi / minute Air breaks and breathing Compress Tx Pressure Decompress Decompress periods Begins Reached Begins Ends (leave unused spaces blank) Chamber Pressure 1 ATA 2.0 ATA - - - - - - 2.0 ATA 1 ATA Clock Time (24 hr) 08:15 08:27 - - - - - - 09:58 10:08 Treatment Length: 113 (minutes) Treatment Segments: 4 Capillary Blood Glucose Pre Capillary Blood Glucose (mg/dl): Post Capillary Blood Glucose (mg/dl): Vital Signs Capillary Blood Glucose Reference Range: 80 - 120 mg / dl HBO Diabetic Blood Glucose Intervention Range: <131 mg/dl or >249 mg/dl Time Vitals Blood Respiratory Capillary Blood Glucose Pulse Action Type: Pulse: Temperature: Taken: Pressure: Rate: Glucose (mg/dl): Meter #: Oximetry (%) Taken: Pre 08:00 150/70 72 18 98.3 Post 10:15 128/84 72 18 98.2 Treatment Response Well Vernon, Kristee ([id number]) Treatment Toleration: Treatment Treatment Completed without Adverse Event Completion Status: HBO Attestation I certify that I  supervised this HBO treatment in accordance with Medicare guidelines. A trained Yes emergency response team is readily available per hospital policies and procedures. Continue HBOT as ordered. Yes Electronic Signature(s) Signed: 12/21/2014 12:22:46 PM By: Christin Fudge MD, FACS Entered By: Christin Fudge on 12/21/2014 11:09:33 [name] ([id number]) -------------------------------------------------------------------------------- HBO Safety Checklist Details Patient Name: [name] Date of Service: 12/21/2014 8:00 AM Medical Record Number: [id number] Patient Account Number: [id number] Date of Birth/Sex: [birth year] (81 y.o. Female) Treating RN: Primary Care Physician: Constance Goltz Other Clinician: Jacqulyn Bath Referring Physician: Constance Goltz Treating Physician/Extender: Frann Rider in Treatment: 14 HBO Safety Checklist Items Safety Checklist Consent Form Signed Patient voided / foley secured and emptied When did you last eato 07:00 am Last dose of injectable or oral agent n/a Ostomy pouch emptied and vented if applicable n/a All implantable devices assessed, documented and approved n/a Intravenous access site secured and place n/a Valuables secured Linens and cotton and cotton/polyester blend (less than 51% polyester) Personal oil-based products / skin lotions / body lotions removed Wigs or hairpieces removed Smoking or tobacco materials removed Books / newspapers / magazines / loose paper removed Cologne, aftershave, perfume and deodorant removed Jewelry removed (may wrap wedding band) Make-up removed Hair care products removed Battery operated devices (external) removed Heating patches and chemical warmers removed Titanium eyewear removed Nail polish cured greater than 10 hours n/a Casting material cured greater than 10 hours n/a Hearing aids removed n/a Loose dentures or partials removed Prosthetics have been removed n/a Patient demonstrates  correct use of air break device (if applicable) Patient concerns have been addressed Patient grounding bracelet on and cord attached to chamber Specifics for Inpatients (complete in addition to above) Medication sheet sent with patient Intravenous medications needed or due during therapy sent with patient [name], [name] ([id number]) Drainage tubes (e.g. nasogastric tube or chest tube secured and vented)  Endotracheal or Tracheotomy tube secured Cuff deflated of air and inflated with saline Airway suctioned Electronic Signature(s) Signed: 12/21/2014 12:27:55 PM By: Lorine Bears RCP, RRT, CHT Entered By: Becky Sax, Amado Nash on 12/21/2014 08:23:11

## 2014-12-22 ENCOUNTER — Encounter: Payer: Medicaid Other | Admitting: Surgery

## 2014-12-22 DIAGNOSIS — L97529 Non-pressure chronic ulcer of other part of left foot with unspecified severity: Secondary | ICD-10-CM | POA: Diagnosis not present

## 2014-12-22 NOTE — Progress Notes (Signed)
OMARA, GILHOOLEY ([id number]) Visit Report for 12/21/2014 Chief Complaint Document Details Patient Name: [name], [name] Date of Service: 12/21/2014 10:45 AM Medical Record Number: [id number] Patient Account Number: [id number] Date of Birth/Sex: Jan 19, 1928 (81 y.o. Female) Treating RN: Afful, RN, BSN, Velva Harman Primary Care Physician: Constance Goltz Other Clinician: Referring Physician: Constance Goltz Treating Physician/Extender: Frann Rider in Treatment: 14 Information Obtained from: Patient Chief Complaint Patient presents to the wound care center today with an open arterial ulcer on the left foot big toe and lateral heal. She also has some superficial ulcerations on the second and third toe dorsum. she has no family member with her today and she is a very poor historian and cannot give a proper history. As stated that she walks around with a walker. Electronic Signature(s) Signed: 12/21/2014 12:22:46 PM By: Christin Fudge MD, FACS Entered By: Christin Fudge on 12/21/2014 11:04:40 [name] ([id number]) -------------------------------------------------------------------------------- Debridement Details Patient Name: [name] Date of Service: 12/21/2014 10:45 AM Medical Record Number: [id number] Patient Account Number: [id number] Date of Birth/Sex: 03-Jun-1928 (81 y.o. Female) Treating RN: Afful, RN, BSN, Velva Harman Primary Care Physician: Constance Goltz Other Clinician: Referring Physician: Constance Goltz Treating Physician/Extender: Frann Rider in Treatment: 14 Debridement Performed for Wound #1 Left,Lateral Calcaneous Assessment: Performed By: Physician Pat Patrick., MD Debridement: Open Wound/Selective Debridement Selective Description: Pre-procedure Yes Verification/Time Out Taken: Start Time: 10:56 Pain Control: Lidocaine 4% Topical Solution Level: Non-Viable Tissue Total Area Debrided (L x 1 (cm) x 1.2 (cm) = 1.2 (cm) W): Tissue and other Non-Viable, Eschar,  Exudate, Fibrin/Slough material debrided: Instrument: Forceps Bleeding: Minimum Hemostasis Achieved: Pressure End Time: 10:59 Procedural Pain: 0 Post Procedural Pain: 0 Response to Treatment: Procedure was tolerated well Post Debridement Measurements of Total Wound Length: (cm) 1 Width: (cm) 1.2 Depth: (cm) 0.5 Volume: (cm) 0.471 Electronic Signature(s) Signed: 12/21/2014 12:22:46 PM By: Christin Fudge MD, FACS Signed: 12/21/2014 5:21:09 PM By: Regan Lemming BSN, RN Entered By: Christin Fudge on 12/21/2014 11:03:50 [name] ([id number]) -------------------------------------------------------------------------------- Debridement Details Patient Name: [name] Date of Service: 12/21/2014 10:45 AM Medical Record Number: [id number] Patient Account Number: [id number] Date of Birth/Sex: [birth year] (81 y.o. Female) Treating RN: Afful, RN, BSN, Velva Harman Primary Care Physician: Constance Goltz Other Clinician: Referring Physician: Constance Goltz Treating Physician/Extender: Frann Rider in Treatment: 14 Debridement Performed for Wound #5 Right Calcaneous Assessment: Performed By: Physician Pat Patrick., MD Debridement: Debridement Pre-procedure Yes Verification/Time Out Taken: Start Time: 10:59 Pain Control: Lidocaine 4% Topical Solution Level: Skin/Subcutaneous Tissue Total Area Debrided (L x 1.4 (cm) x 1.4 (cm) = 1.96 (cm) W): Tissue and other Viable, Non-Viable, Eschar, Fibrin/Slough, Subcutaneous material debrided: Instrument: Forceps Bleeding: Minimum Hemostasis Achieved: Pressure End Time: 11:02 Procedural Pain: 0 Post Procedural Pain: 0 Response to Treatment: Procedure was tolerated well Post Debridement Measurements of Total Wound Length: (cm) 1.4 Width: (cm) 1.4 Depth: (cm) 0.5 Volume: (cm) 0.77 Electronic Signature(s) Signed: 12/21/2014 12:22:46 PM By: Christin Fudge MD, FACS Signed: 12/21/2014 5:21:09 PM By: Regan Lemming BSN, RN Entered By: Christin Fudge on 12/21/2014 11:04:27 [name] ([id number]) -------------------------------------------------------------------------------- HPI Details Patient Name: [name] Date of Service: 12/21/2014 10:45 AM Medical Record Number: [id number] Patient Account Number: [id number] Date of Birth/Sex: Aug 07, 1928 (81 y.o. Female) Treating RN: Baruch Gouty, RN, BSN, Velva Harman Primary Care Physician: Constance Goltz Other Clinician: Referring Physician: Constance Goltz Treating Physician/Extender: Frann Rider in Treatment: 14 History of Present Illness Location: left heel Quality: Patient reports experiencing a dull pain to affected area(s). Severity: Patient states wound are getting  worse. Duration: Patient states that they are not certain how long the wound has been present. Timing: Pain in wound is Intermittent (comes and goes Context: The wound appeared gradually over time Associated Signs and Symptoms: Patient reports having difficulty standing for long periods. HPI Description: This 81 year old patient is a poor historian and comes from a nursing home. Does not have any family members with her. Says she has a ulcer on the lateral part of her left foot and some new ones are there on her right foot too. She is unable to say how long she's had these. she does walk with a walker and this is limited most of the day. Does not recall if she has had any vascular studies done. I have reviewed an x-ray done of the left foot which basically shows osteoporosis but no evidence of fracture dislocation or osteomyelitis. 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 She seems to be doing better overall. 10/19/14 -- I understand she has gone to the vascular surgery and lab yesterday and has had all her tests done yesterday. She is doing fine  otherwise and has had no fresh issues. 10/26/2014 she seems to be doing well and has no fresh problems and seems much brighter. 10/12/14 -- Taking her antibiotic and continues to do her dressings locally and she is helped by her skilled nursing. She seems to be doing fine and has no fresh issues. 10/19/14 -- she is doing fine and has had no fresh issues. She says her son to go for her vascular workup yesterday and she has been called back after 3 months. we will try and gather these reports to review what they said. 10/26/14 --I have been able to review notes from the vascular surgery office and these were dated from 10/18/2014. He patient was on her first postop visit and had Doppler ultrasounds done of her arteries. She had more than 50% stenosis of the right superficial femoral artery and more than 50% stenosis in the left tibioperoneal trunk. She was status post a left lower extremity angiogram with angioplasty of the peroneal and left superficial femoral arteries for a nonhealing left foot and ankle ulceration. note is made of the fact that the ABIs had not changed in the last month since her previous visit. The opinion of her provider was that she did not need any intervention at this time and they had done everything they could at the present time. They would continue on Plavix and antibiotics and see her back in 3 months for a another arterial duplex study of the lower extremities. [name], [name] ([id number]) 11/02/2014 -- she has spoken to her caregivers about coming for HBO 5 times per week for 6-8 weeks and they are agreeable about this and she is willing to undergo hyperbaric oxygen therapy. 11/16/2014 she has been worked up with a EKG and chest x-ray and these were within normal limits. As far as the investigations go she is ready for HBOT. we are awaiting some insurance clearance so that she can start on her hyperbaric oxygen therapy. 11/30/2014 she tolerated hyperbaric oxygen  therapy fine and there were no issues with her blood glucose levels. As noted she is not a diabetic and most of her fingerstick blood glucose levels are inaccurate because of the Raynaud's phenomena. She normally has to have your  lobe sticks to get any random glucose levels. I have also requested her primary care does a venous blood glucose draw to check her glucose level. 12/21/2014 -- she is tolerating hyperbaric oxygen very well and has overall made a lot of progress. Her left leg is looking much better. Electronic Signature(s) Signed: 12/21/2014 12:22:46 PM By: Christin Fudge MD, FACS Entered By: Christin Fudge on 12/21/2014 11:05:21 [name] ([id number]) -------------------------------------------------------------------------------- Physical Exam Details Patient Name: [name] Date of Service: 12/21/2014 10:45 AM Medical Record Number: [id number] Patient Account Number: [id number] Date of Birth/Sex: [birth year] (81 y.o. Female) Treating RN: Afful, RN, BSN, Velva Harman Primary Care Physician: Constance Goltz Other Clinician: Referring Physician: Constance Goltz Treating Physician/Extender: Frann Rider in Treatment: 14 Constitutional . Pulse regular. Respirations normal and unlabored. Afebrile. . Eyes Nonicteric. Reactive to light. Ears, Nose, Mouth, and Throat Lips, teeth, and gums WNL.Marland Kitchen Moist mucosa without lesions . Neck supple and nontender. No palpable supraclavicular or cervical adenopathy. Normal sized without goiter. Respiratory WNL. No retractions.. Cardiovascular Pedal Pulses weakly palpable.. No clubbing, cyanosis or edema. Integumentary (Hair, Skin) the left leg looks very good and the Achilles tendon is almost healed. The right leg still has some slough which need sharp debridement.Marland Kitchen Psychiatric Judgement and insight Intact.. No evidence of depression, anxiety, or agitation.. Electronic Signature(s) Signed: 12/21/2014 12:22:46 PM By: Christin Fudge MD,  FACS Entered By: Christin Fudge on 12/21/2014 11:06:15 [name] ([id number]) -------------------------------------------------------------------------------- Physician Orders Details Patient Name: [name] Date of Service: 12/21/2014 10:45 AM Medical Record Number: [id number] Patient Account Number: [id number] Date of Birth/Sex: [birth year] (81 y.o. Female) Treating RN: Montey Hora Primary Care Physician: Constance Goltz Other Clinician: Referring Physician: Constance Goltz Treating Physician/Extender: Frann Rider in Treatment: 33 Verbal / Phone Orders: Yes Clinician: Montey Hora Read Back and Verified: Yes Diagnosis Coding Wound Cleansing Wound #1 Left,Lateral Calcaneous o Clean wound with Normal Saline. - be sure to clean out all remaining prisma Wound #5 Right Calcaneous o Clean wound with Normal Saline. - be sure to clean out all remaining prisma Wound #6 Left Achilles o Clean wound with Normal Saline. - be sure to clean out all remaining prisma Anesthetic Wound #1 Left,Lateral Calcaneous o Topical Lidocaine 4% cream applied to wound bed prior to debridement o Hurricaine Topical Anesthetic Spray applied to wound bed prior to debridement Wound #5 Right Calcaneous o Topical Lidocaine 4% cream applied to wound bed prior to debridement o Hurricaine Topical Anesthetic Spray applied to wound bed prior to debridement Wound #6 Left Achilles o Topical Lidocaine 4% cream applied to wound bed prior to debridement o Hurricaine Topical Anesthetic Spray applied to wound bed prior to debridement Primary Wound Dressing Wound #1 Left,Lateral Calcaneous o Prisma Ag Wound #6 Left Achilles o Prisma Ag Wound #5 Right Calcaneous o Santyl Ointment Secondary Dressing Wound #1 Left,Lateral Calcaneous o ABD and Kerlix/Conform [place], [place] ([id number]) Wound #5 Right Calcaneous o ABD and Kerlix/Conform Wound #6 Left Achilles o ABD and  Kerlix/Conform Dressing Change Frequency Wound #1 Left,Lateral Calcaneous o Change dressing every day. Wound #5 Right Calcaneous o Change dressing every day. Wound #6 Left Achilles o Change dressing every day. Follow-up Appointments Wound #1 Left,Lateral Calcaneous o Return Appointment in 1 week. Wound #5 Right Calcaneous o Return Appointment in 1 week. Wound #6 Left Achilles o Return Appointment in 1 week. Off-Loading Wound #1 Left,Lateral Calcaneous o Other: - SAGE boots while sitting or lying. Do not wear while ambulating. Wound #5 Right Calcaneous o Other: - SAGE boots while  sitting or lying. Do not wear while ambulating. Wound #6 Left Achilles o Other: - SAGE boots while sitting or lying. Do not wear while ambulating. Home Health Wound #1 [place] Visits - [place] Nurse may visit PRN to address patientos wound care needs. o FACE TO FACE ENCOUNTER: MEDICARE and MEDICAID PATIENTS: I certify that this patient is under my care and that I had a face-to-face encounter that meets the physician face-to-face encounter requirements with this patient on this date. The encounter with the patient was in whole or in part for the following MEDICAL CONDITION: (primary reason for [place]) MEDICAL NECESSITY: I certify, that based on my findings, NURSING services are a medically necessary home health service. HOME BOUND STATUS: I certify that my clinical findings support that this patient is homebound (i.e., Due to illness or injury, pt requires aid of LEMOYNE, MCMATH ([id number]) supportive devices such as crutches, cane, wheelchairs, walkers, the use of special transportation or the assistance of another person to leave their place of residence. There is a normal inability to leave the home and doing so requires considerable and taxing effort. Other absences are for medical reasons / religious services and are  infrequent or of short duration when for other reasons). o If current dressing causes regression in wound condition, may D/C ordered dressing product/s and apply Normal Saline Moist Dressing daily until next [place] / Other MD appointment. [place] of regression in wound condition at [phone]. o Please direct any NON-WOUND related issues/requests for orders to patient's Primary Care Physician Wound #5 Right [place] Visits - [place] Nurse may visit PRN to address patientos wound care needs. o FACE TO FACE ENCOUNTER: MEDICARE and MEDICAID PATIENTS: I certify that this patient is under my care and that I had a face-to-face encounter that meets the physician face-to-face encounter requirements with this patient on this date. The encounter with the patient was in whole or in part for the following MEDICAL CONDITION: (primary reason for [place]) MEDICAL NECESSITY: I certify, that based on my findings, NURSING services are a medically necessary home health service. HOME BOUND STATUS: I certify that my clinical findings support that this patient is homebound (i.e., Due to illness or injury, pt requires aid of supportive devices such as crutches, cane, wheelchairs, walkers, the use of special transportation or the assistance of another person to leave their place of residence. There is a normal inability to leave the home and doing so requires considerable and taxing effort. Other absences are for medical reasons / religious services and are infrequent or of short duration when for other reasons). o If current dressing causes regression in wound condition, may D/C ordered dressing product/s and apply Normal Saline Moist Dressing daily until next [place] / Other MD appointment. [place] of regression in wound condition at [phone]. o Please direct any NON-WOUND related  issues/requests for orders to patient's Primary Care Physician Wound #6 Left Achilles o [place] Visits - [place] Nurse may visit PRN to address patientos wound care needs. o FACE TO FACE ENCOUNTER: MEDICARE and MEDICAID PATIENTS: I certify that this patient is under my care and that I had a face-to-face encounter that meets the physician face-to-face encounter requirements with this patient on this date. The encounter with the patient was in whole or in part for the following MEDICAL CONDITION: (primary reason for  Home Healthcare) MEDICAL NECESSITY: I certify, that based on my findings, NURSING services are a medically necessary home health service. HOME BOUND STATUS: I certify that my clinical findings support that this patient is homebound (i.e., Due to illness or injury, pt requires aid of supportive devices such as crutches, cane, wheelchairs, walkers, the use of special transportation or the assistance of another person to leave their place of residence. There is a normal inability to leave the home and doing so requires considerable and taxing effort. Other absences are for medical reasons / religious services and are infrequent or of short duration when for other reasons). o If current dressing causes regression in wound condition, may D/C ordered dressing product/s and apply Normal Saline Moist Dressing daily until next [place] / Other MD appointment. [place] of regression in wound condition at [phone]. SHAMONI, MEARS ([id number]) o Please direct any NON-WOUND related issues/requests for orders to patient's Primary Care Physician Hyperbaric Oxygen Therapy Wound #1 Left,Lateral Calcaneous o Indication: - acute arterial insufficiency o If appropriate for treatment, begin HBOT per protocol: o 2.0 ATA for 90 Minutes without Air Breaks o One treatment per day (delivered Monday through Friday unless  otherwise specified in Special Instructions below): o Total # of Treatments: - 30 Wound #5 Right Calcaneous o Indication: - acute arterial insufficiency o If appropriate for treatment, begin HBOT per protocol: o 2.0 ATA for 90 Minutes without Air Breaks o One treatment per day (delivered Monday through Friday unless otherwise specified in Special Instructions below): o Total # of Treatments: - 30 Wound #6 Left Achilles o Indication: - acute arterial insufficiency o If appropriate for treatment, begin HBOT per protocol: o 2.0 ATA for 90 Minutes without Air Breaks o One treatment per day (delivered Monday through Friday unless otherwise specified in Special Instructions below): o Total # of Treatments: - 30 Medications-please add to medication list. Wound #5 Right Calcaneous o Santyl Enzymatic Ointment Electronic Signature(s) Signed: 12/21/2014 12:22:46 PM By: Christin Fudge MD, FACS Signed: 12/21/2014 5:45:27 PM By: Montey Hora Entered By: Montey Hora on 12/21/2014 11:02:39 [name] ([id number]) -------------------------------------------------------------------------------- Problem List Details Patient Name: [name] Date of Service: 12/21/2014 10:45 AM Medical Record Number: [id number] Patient Account Number: [id number] Date of Birth/Sex: Apr 25, 1928 (81 y.o. 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 09/12/2014 Yes layer exposed 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 09/12/2014 Yes of heel and midfoot I70.235 Atherosclerosis of native arteries of right leg with 09/12/2014 Yes ulceration of other part of foot L97.412  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 Female) Treating RN: Afful, RN, BSN, Velva Harman Primary Care Physician: Constance Goltz Other Clinician: Referring Physician: Constance Goltz Treating Physician/Extender: Frann Rider in Treatment: 14 Subjective Chief Complaint Information obtained from Patient Patient presents to the wound care center today with an open arterial ulcer on the left foot big toe and lateral heal. She also  has some superficial ulcerations on the second and third toe dorsum. she has no family member with her today and she is a very poor historian and cannot give a proper history. As stated that she walks around with a walker. History of Present Illness (HPI) The following HPI elements were documented for the patient's wound: Location: left heel Quality: Patient reports experiencing a dull pain to affected area(s). Severity: Patient states wound are getting worse. Duration: Patient states that they are not certain how long the wound has been present. Timing: Pain in wound is Intermittent (comes and goes Context: The wound appeared gradually over time Associated Signs and Symptoms: Patient reports having difficulty standing for long periods. This 81 year old patient is a poor historian and comes from a nursing home. Does not have any family members with her. Says she has a ulcer on the lateral part of her left foot and some new ones are there on her right  foot too. She is unable to say how long she's had these. she does walk with a walker and this is limited most of the day. Does not recall if she has had any vascular studies done. I have reviewed an x-ray done of the left foot which basically shows osteoporosis but no evidence of fracture dislocation or osteomyelitis. 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 She seems to be doing better overall. 10/19/14 -- I understand she has gone to the vascular surgery and lab yesterday and has had all her tests done yesterday. She is doing fine otherwise and has had no fresh issues. 10/26/2014 she seems to be doing well and has no fresh problems and seems much brighter. 10/12/14 -- Taking her antibiotic and continues to do her dressings locally and she is helped by her skilled nursing. She seems to be doing fine and has no fresh issues. 10/19/14 -- she is doing fine and has had no fresh issues. She says her son to go for her vascular workup yesterday and she has been called back after 3 months. we will try and gather these reports to review what [name], [name] ([id number]) they said. 10/26/14 --I have been able to review notes from the vascular surgery office and these were dated from 10/18/2014. He patient was on her first postop visit and had Doppler ultrasounds done of her arteries. She had more than 50% stenosis of the right superficial femoral artery and more than 50% stenosis in the left tibioperoneal trunk. She was status post a left lower extremity angiogram with angioplasty of the peroneal and left superficial femoral arteries for a nonhealing left foot and ankle ulceration. note is made of the fact that the ABIs had not changed in the last month since her previous visit. The opinion of her provider was that she did not need any  intervention at this time and they had done everything they could at the present time. They would continue on Plavix and antibiotics and see her back in 3 months for a another arterial duplex study of the lower extremities. 11/02/2014 -- she has spoken to her caregivers about coming for HBO 5 times per week for 6-8 weeks and they are agreeable about this and she is willing to undergo hyperbaric oxygen therapy. 11/16/2014 she has been worked up with a EKG and chest x-ray and these were within normal  limits. As far as the investigations go she is ready for HBOT. we are awaiting some insurance clearance so that she can start on her hyperbaric oxygen therapy. 11/30/2014 she tolerated hyperbaric oxygen therapy fine and there were no issues with her blood glucose levels. As noted she is not a diabetic and most of her fingerstick blood glucose levels are inaccurate because of the Raynaud's phenomena. She normally has to have your lobe sticks to get any random glucose levels. I have also requested her primary care does a venous blood glucose draw to check her glucose level. 12/21/2014 -- she is tolerating hyperbaric oxygen very well and has overall made a lot of progress. Her left leg is looking much better. Objective Constitutional Pulse regular. Respirations normal and unlabored. Afebrile. Vitals Time Taken: 10:15 AM, Height: 64 in, Weight: 180 lbs, BMI: 30.9, Temperature: 98.2 F, Pulse: 72 bpm, Respiratory Rate: 18 breaths/min, Blood Pressure: 124/84 mmHg. Eyes Nonicteric. Reactive to light. Ears, Nose, Mouth, and Throat Lips, teeth, and gums WNL.Marland Kitchen Moist mucosa without lesions . Neck supple and nontender. No palpable supraclavicular or cervical adenopathy. Normal sized without goiter. [name], [name] ([id number]) Respiratory WNL. No retractions.. Cardiovascular Pedal Pulses weakly palpable.. No clubbing, cyanosis or edema. Psychiatric Judgement and insight Intact.. No evidence of  depression, anxiety, or agitation.. Integumentary (Hair, Skin) the left leg looks very good and the Achilles tendon is almost healed. The right leg still has some slough which need sharp debridement.. Wound #1 status is Open. Original cause of wound was Pressure Injury. The wound is located on the Left,Lateral Calcaneous. The wound measures 1cm length x 1.2cm width x 0.5cm depth; 0.942cm^2 area and 0.471cm^3 volume. The wound is limited to skin breakdown. There is no tunneling noted. There is a small amount of serosanguineous drainage noted. The wound margin is distinct with the outline attached to the wound base. There is medium (34-66%) pink granulation within the wound bed. There is a small (1-33%) amount of necrotic tissue within the wound bed including Adherent Slough. The periwound skin appearance exhibited: Localized Edema, Moist, Mottled. The periwound skin appearance did not exhibit: Callus, Crepitus, Excoriation, Fluctuance, Friable, Induration, Rash, Scarring, Dry/Scaly, Maceration, Atrophie Blanche, Cyanosis, Ecchymosis, Hemosiderin Staining, Pallor, Rubor, Erythema. Periwound temperature was noted as No Abnormality. The periwound has tenderness on palpation. Wound #5 status is Open. Original cause of wound was Gradually Appeared. The wound is located on the Right Calcaneous. The wound measures 1.4cm length x 1.4cm width x 0.5cm depth; 1.539cm^2 area and 0.77cm^3 volume. The wound is limited to skin breakdown. There is no tunneling or undermining noted. There is a medium amount of serosanguineous drainage noted. The wound margin is distinct with the outline attached to the wound base. There is medium (34-66%) pink granulation within the wound bed. There is a medium (34-66%) amount of necrotic tissue within the wound bed including Adherent Slough. The periwound skin appearance exhibited: Localized Edema, Moist, Mottled. The periwound skin appearance did not exhibit: Callus, Crepitus,  Excoriation, Fluctuance, Friable, Induration, Rash, Scarring, Dry/Scaly, Maceration, Atrophie Blanche, Cyanosis, Ecchymosis, Hemosiderin Staining, Pallor, Rubor, Erythema. Periwound temperature was noted as No Abnormality. The periwound has tenderness on palpation. Wound #6 status is Open. Original cause of wound was Gradually Appeared. The wound is located on the Left Achilles. The wound measures 1cm length x 0.8cm width x 0.1cm depth; 0.628cm^2 area and 0.063cm^3 volume. The wound is limited to skin breakdown. There is no tunneling or undermining noted. There is a small amount of serosanguineous drainage noted.  The wound margin is distinct with the outline attached to the wound base. There is medium (34-66%) pink granulation within the wound bed. There is a small (1-33%) amount of necrotic tissue within the wound bed including Adherent Slough. The periwound skin appearance exhibited: Localized Edema, Moist. The periwound skin appearance did not exhibit: Callus, Crepitus, Excoriation, Fluctuance, Friable, Induration, Rash, Scarring, Dry/Scaly, Maceration, Atrophie Blanche, Cyanosis, Ecchymosis, Hemosiderin Staining, Mottled, Pallor, Rubor, Erythema. Periwound temperature was noted as No Abnormality. The periwound has tenderness on palpation. 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 She is benefiting from HBOT and we will continue the same. She will see me back next week. Procedures Wound #1 Wound #1  is a Diabetic Wound/Ulcer of the Lower Extremity located on the Left,Lateral Calcaneous . There was a Non-Viable Tissue Open Wound/Selective [phone]) debridement with total area of 1.2 sq cm performed by Dajour Pierpoint, Jackson Latino., MD. with the following instrument(s): Forceps to remove Non-Viable tissue/material including Exudate, Fibrin/Slough, and Eschar after achieving pain control using Lidocaine 4% Topical Solution. A time out was conducted prior to the start of the procedure. A Minimum amount of bleeding was controlled with Pressure. The procedure was tolerated well with a pain level of 0 throughout and a pain level of 0 following the procedure. Post Debridement Measurements: 1cm length x 1.2cm width x 0.5cm depth; 0.471cm^3 volume. Wound #5 Wound #5 is a Diabetic Wound/Ulcer of the Lower Extremity located on the Right Calcaneous . There was a Skin/Subcutaneous Tissue Debridement [id number]) debridement with total area of 1.96 sq cm performed by Kateline Kinkade, Jackson Latino., MD. with the following instrument(s): Forceps to remove Viable and Non- Viable tissue/material including Fibrin/Slough, Eschar, and Subcutaneous after achieving pain control using Lidocaine 4% Topical Solution. A time out was conducted prior to the start of the procedure. A Minimum amount of bleeding was controlled with Pressure. The procedure was tolerated well with a pain level of 0 throughout and a pain level of 0 following the procedure. Post Debridement Measurements: 1.4cm length x 1.4cm width x 0.5cm depth; 0.77cm^3 volume. [name], [name] ([id number]) Plan Wound Cleansing: Wound #1 Left,Lateral Calcaneous: Clean wound with Normal Saline. - be sure to clean out all remaining prisma Wound #5 Right Calcaneous: Clean wound with Normal Saline. - be sure to clean out all remaining prisma Wound #6 Left Achilles: Clean wound with Normal Saline. - be sure to clean out all remaining prisma Anesthetic: Wound #1 Left,Lateral  Calcaneous: Topical Lidocaine 4% cream applied to wound bed prior to debridement Hurricaine Topical Anesthetic Spray applied to wound bed prior to debridement Wound #5 Right Calcaneous: Topical Lidocaine 4% cream applied to wound bed prior to debridement Hurricaine Topical Anesthetic Spray applied to wound bed prior to debridement Wound #6 Left Achilles: Topical Lidocaine 4% cream applied to wound bed prior to debridement Hurricaine Topical Anesthetic Spray applied to wound bed prior to debridement Primary Wound Dressing: Wound #1 Left,Lateral Calcaneous: Prisma Ag Wound #6 Left Achilles: Prisma Ag Wound #5 Right Calcaneous: Santyl Ointment Secondary Dressing: Wound #1 Left,Lateral Calcaneous: ABD and  Kerlix/Conform Wound #5 Right Calcaneous: ABD and Kerlix/Conform Wound #6 Left Achilles: ABD and Kerlix/Conform Dressing Change Frequency: Wound #1 Left,Lateral Calcaneous: Change dressing every day. Wound #5 Right Calcaneous: Change dressing every day. Wound #6 Left Achilles: Change dressing every day. Follow-up Appointments: Wound #1 Left,Lateral Calcaneous: Return Appointment in 1 week. Wound #5 Right Calcaneous: Return Appointment in 1 week. Wound #6 Left Achilles: Return Appointment in 1 week. Off-Loading: Wound #1 Left,Lateral Calcaneous: [name], [name] ([id number]) Other: - SAGE boots while sitting or lying. Do not wear while ambulating. Wound #5 Right Calcaneous: Other: - SAGE boots while sitting or lying. Do not wear while ambulating. Wound #6 Left Achilles: Other: - SAGE boots while sitting or lying. Do not wear while ambulating. Home Health: Wound #1 Left,Lateral Calcaneous: Continue Home Health Visits - [facility] Nurse may visit PRN to address patient s wound care needs. FACE TO FACE ENCOUNTER: MEDICARE and MEDICAID PATIENTS: I certify that this patient is under my care and that I had a face-to-face encounter that meets the physician face-to-face  encounter requirements with this patient on this date. The encounter with the patient was in whole or in part for the following MEDICAL CONDITION: (primary reason for [place]) MEDICAL NECESSITY: I certify, that based on my findings, NURSING services are a medically necessary home health service. HOME BOUND STATUS: I certify that my clinical findings support that this patient is homebound (i.e., Due to illness or injury, pt requires aid of supportive devices such as crutches, cane, wheelchairs, walkers, the use of special transportation or the assistance of another person to leave their place of residence. There is a normal inability to leave the home and doing so requires considerable and taxing effort. Other absences are for medical reasons / religious services and are infrequent or of short duration when for other reasons). If current dressing causes regression in wound condition, may D/C ordered dressing product/s and apply Normal Saline Moist Dressing daily until next [place] / Other MD appointment. [place] of regression in wound condition at [phone]. Please direct any NON-WOUND related issues/requests for orders to patient's Primary Care Physician Wound #5 Right Calcaneous: [place] Visits - [facility] Nurse may visit PRN to address patient s wound care needs. FACE TO FACE ENCOUNTER: MEDICARE and MEDICAID PATIENTS: I certify that this patient is under my care and that I had a face-to-face encounter that meets the physician face-to-face encounter requirements with this patient on this date. The encounter with the patient was in whole or in part for the following MEDICAL CONDITION: (primary reason for [place]) MEDICAL NECESSITY: I certify, that based on my findings, NURSING services are a medically necessary home health service. HOME BOUND STATUS: I certify that my clinical findings support that this patient is  homebound (i.e., Due to illness or injury, pt requires aid of supportive devices such as crutches, cane, wheelchairs, walkers, the use of special transportation or the assistance of another person to leave their place of residence. There is a normal inability to leave the home and doing so requires considerable and taxing effort. Other absences are for medical reasons / religious services and are infrequent or of short duration when for other reasons). If current dressing causes regression in wound condition, may D/C ordered dressing product/s and apply Normal Saline Moist Dressing daily until next [place] / Other MD appointment. [place] of regression in wound condition at [phone]. Please direct any NON-WOUND related  issues/requests for orders to patient's Primary Care Physician Wound #6 Left Achilles: [place] Visits - [facility] Nurse may visit PRN to address patient s wound care needs. FACE TO FACE ENCOUNTER: MEDICARE and MEDICAID PATIENTS: I certify that this patient is under my care and that I had a face-to-face encounter that meets the physician face-to-face encounter requirements with this patient on this date. The encounter with the patient was in whole or in part for the following MEDICAL CONDITION: (primary reason for [place]) MEDICAL NECESSITY: I certify, that based on my findings, NURSING services are a medically necessary home health service. HOME BOUND STATUS: I certify that my clinical findings support that this patient is homebound (i.e., Due to illness or injury, pt requires aid of supportive devices such as crutches, cane, wheelchairs, walkers, the use of special transportation or the assistance of another person to leave their place of residence. There is a [name], [name] ([id number]) normal inability to leave the home and doing so requires considerable and taxing effort. Other absences are for medical reasons  / religious services and are infrequent or of short duration when for other reasons). If current dressing causes regression in wound condition, may D/C ordered dressing product/s and apply Normal Saline Moist Dressing daily until next [place] / Other MD appointment. [place] of regression in wound condition at [phone]. Please direct any NON-WOUND related issues/requests for orders to patient's Primary Care Physician Hyperbaric Oxygen Therapy: Wound #1 Left,Lateral Calcaneous: Indication: - acute arterial insufficiency If appropriate for treatment, begin HBOT per protocol: 2.0 ATA for 90 Minutes without Air Breaks One treatment per day (delivered Monday through Friday unless otherwise specified in Special Instructions below): Total # of Treatments: - 30 Wound #5 Right Calcaneous: Indication: - acute arterial insufficiency If appropriate for treatment, begin HBOT per protocol: 2.0 ATA for 90 Minutes without Air Breaks One treatment per day (delivered Monday through Friday unless otherwise specified in Special Instructions below): Total # of Treatments: - 30 Wound #6 Left Achilles: Indication: - acute arterial insufficiency If appropriate for treatment, begin HBOT per protocol: 2.0 ATA for 90 Minutes without Air Breaks One treatment per day (delivered Monday through Friday unless otherwise specified in Special Instructions below): Total # of Treatments: - 30 Medications-please add to medication list.: Wound #5 Right Calcaneous: Santyl Enzymatic Ointment We will continue the local care Prisma on the left lower extremity and Santyl ointment on the right lower extremity. She is benefiting from HBOT and we will continue the same. She will see me back next week. Electronic Signature(s) Signed: 12/21/2014 12:22:46 PM By: Christin Fudge MD, FACS Entered By: Christin Fudge on 12/21/2014 11:08:28

## 2014-12-22 NOTE — Progress Notes (Signed)
HIDI, LANSER ([id number]) Visit Report for 12/21/2014 Arrival Information Details Patient Name: [name], [name] Date of Service: 12/21/2014 10:45 AM Medical Record Number: [id number] Patient Account Number: [id number] Date of Birth/Sex: June 02, 1928 (81 y.o. Female) Treating RN: Afful, RN, BSN, Velva Harman Primary Care Physician: Constance Goltz Other Clinician: Referring Physician: Constance Goltz Treating Physician/Extender: Frann Rider in Treatment: 14 Visit Information History Since Last Visit Any new allergies or adverse reactions: No Patient Arrived: Wheel Chair Had a fall or experienced change in No Arrival Time: 10:40 activities of daily living that may affect Accompanied By: SELF risk of falls: Transfer Assistance: None Signs or symptoms of abuse/neglect since last No Patient Identification Verified: Yes visito Secondary Verification Process Yes Hospitalized since last visit: No Completed: Has Dressing in Place as Prescribed: Yes Patient Requires Transmission- No Pain Present Now: Yes Based Precautions: Patient Has Alerts: Yes Patient Alerts: Patient on Blood Thinner DMII Plavix ABI L 0.63 R 0.79 Electronic Signature(s) Signed: 12/21/2014 5:21:09 PM By: Regan Lemming BSN, RN Entered By: Regan Lemming on 12/21/2014 10:49:50 [name] ([id number]) -------------------------------------------------------------------------------- Encounter Discharge Information Details Patient Name: [name] Date of Service: 12/21/2014 10:45 AM Medical Record Number: [id number] Patient Account Number: [id number] Date of Birth/Sex: 1928/03/08 (81 y.o. Female) Treating RN: Baruch Gouty, RN, BSN, Velva Harman Primary Care Physician: Constance Goltz Other Clinician: Referring Physician: Constance Goltz Treating Physician/Extender: Frann Rider in Treatment: 14 Encounter Discharge Information Items Discharge Pain Level: 0 Discharge Condition: Stable Ambulatory Status: Walker Discharge  Destination: Home Private Transportation: Auto Accompanied By: self Schedule Follow-up Appointment: No Medication Reconciliation completed and No provided to Patient/Care Michell Kader: Clinical Summary of Care: Electronic Signature(s) Signed: 12/21/2014 5:21:09 PM By: Regan Lemming BSN, RN Entered By: Regan Lemming on 12/21/2014 11:15:29 [name] ([id number]) -------------------------------------------------------------------------------- Lower Extremity Assessment Details Patient Name: [name] Date of Service: 12/21/2014 10:45 AM Medical Record Number: [id number] Patient Account Number: [id number] Date of Birth/Sex: 08/05/1928 (81 y.o. Female) Treating RN: Afful, RN, BSN, Velva Harman Primary Care Physician: Constance Goltz Other Clinician: Referring Physician: Constance Goltz Treating Physician/Extender: Frann Rider in Treatment: 14 Edema Assessment Assessed: [Left: No] [Right: No] Edema: [Left: No] [Right: No] Vascular Assessment Pulses: Posterior Tibial Dorsalis Pedis Palpable: [Left:Yes] [Right:Yes] Extremity colors, hair growth, and conditions: Extremity Color: [Left:Dusky] [Right:Dusky] Hair Growth on Extremity: [Left:No] [Right:No] Temperature of Extremity: [Left:Warm] [Right:Warm] Capillary Refill: [Left:< 3 seconds] [Right:< 3 seconds] Dependent Rubor: [Left:No] [Right:No] Blanched when Elevated: [Left:No] [Right:No] Lipodermatosclerosis: [Left:No] [Right:No] Toe Nail Assessment Left: Right: Thick: Yes Yes Discolored: Yes Yes Deformed: No No Improper Length and Hygiene: No No Electronic Signature(s) Signed: 12/21/2014 5:21:09 PM By: Regan Lemming BSN, RN Entered By: Regan Lemming on 12/21/2014 10:50:56 [name] ([id number]) -------------------------------------------------------------------------------- Multi Wound Chart Details Patient Name: [name] Date of Service: 12/21/2014 10:45 AM Medical Record Number: [id number] Patient Account Number:  [id number] Date of Birth/Sex: 05-31-1928 (81 y.o. Female) Treating RN: Montey Hora Primary Care Physician: Constance Goltz Other Clinician: Referring Physician: Constance Goltz Treating Physician/Extender: Frann Rider in Treatment: 14 Vital Signs Height(in): 64 Pulse(bpm): 72 Weight(lbs): 180 Blood Pressure 124/84 (mmHg): Body Mass Index(BMI): 31 Temperature(F): 98.2 Respiratory Rate 18 (breaths/min): Photos: [1:No Photos] [5:No Photos] [6:No Photos] Wound Location: [1:Left Calcaneous - Lateral] [5:Right Calcaneous] [6:Left Achilles] Wounding Event: [1:Pressure Injury] [5:Gradually Appeared] [6:Gradually Appeared] Primary Etiology: [1:Diabetic Wound/Ulcer of the Lower Extremity] [5:Diabetic Wound/Ulcer of the Lower Extremity] [6:Diabetic Wound/Ulcer of the Lower Extremity] Secondary Etiology: [1:Pressure Ulcer] [5:N/A] [6:N/A] Comorbid History: [1:Glaucoma, Asthma, Hypertension, Peripheral Venous Disease, Type II Diabetes] [5:Glaucoma, Asthma, Hypertension, Peripheral  Venous Disease, Type II Diabetes] [6:Glaucoma, Asthma, Hypertension, Peripheral Venous Disease, Type II  Diabetes] Date Acquired: [1:07/24/2014] [5:09/26/2014] [6:09/26/2014] Weeks of Treatment: [1:14] [5:11] [6:11] Wound Status: [1:Open] [5:Open] [6:Open] Measurements L x W x D 1x1.2x0.5 [5:1.4x1.4x0.5] [6:1x0.8x0.1] (cm) Area (cm) : [1:0.942] [5:1.539] [6:0.628] Volume (cm) : [1:0.471] [5:0.77] [6:0.063] % Reduction in Area: [1:81.50%] [5:72.00%] [6:83.30%] % Reduction in Volume: 76.90% [5:-40.00%] [6:96.70%] Classification: [1:Grade 1] [5:Grade 2] [6:Grade 1] Exudate Amount: [1:Small] [5:Medium] [6:Small] Exudate Type: [1:Serosanguineous] [5:Serosanguineous] [6:Serosanguineous] Exudate Color: [1:red, brown] [5:red, brown] [6:red, brown] Wound Margin: [1:Distinct, outline attached] [5:Distinct, outline attached] [6:Distinct, outline attached] Granulation Amount: [1:Medium (34-66%)] [5:Medium (34-66%)]  [6:Medium (34-66%)] Granulation Quality: [1:Pink] [5:Pink] [6:Pink] Necrotic Amount: [1:Small (1-33%)] [5:Medium (34-66%)] [6:Small (1-33%)] Exposed Structures: [1:Fascia: No Fat: No Tendon: No] [5:Fascia: No Fat: No Tendon: No] [6:Fascia: No Fat: No Tendon: No] Muscle: No Muscle: No Muscle: No Joint: No Joint: No Joint: No Bone: No Bone: No Bone: No Limited to Skin Limited to Skin Limited to Skin Breakdown Breakdown Breakdown Epithelialization: Small (1-33%) Small (1-33%) Small (1-33%) Periwound Skin Texture: Edema: Yes Edema: Yes Edema: Yes Excoriation: No Excoriation: No Excoriation: No Induration: No Induration: No Induration: No Callus: No Callus: No Callus: No Crepitus: No Crepitus: No Crepitus: No Fluctuance: No Fluctuance: No Fluctuance: No Friable: No Friable: No Friable: No Rash: No Rash: No Rash: No Scarring: No Scarring: No Scarring: No Periwound Skin Moist: Yes Moist: Yes Moist: Yes Moisture: Maceration: No Maceration: No Maceration: No Dry/Scaly: No Dry/Scaly: No Dry/Scaly: No Periwound Skin Color: Mottled: Yes Mottled: Yes Atrophie Blanche: No Atrophie Blanche: No Atrophie Blanche: No Cyanosis: No Cyanosis: No Cyanosis: No Ecchymosis: No Ecchymosis: No Ecchymosis: No Erythema: No Erythema: No Erythema: No Hemosiderin Staining: No Hemosiderin Staining: No Hemosiderin Staining: No Mottled: No Pallor: No Pallor: No Pallor: No Rubor: No Rubor: No Rubor: No Temperature: No Abnormality No Abnormality No Abnormality Tenderness on Yes Yes Yes Palpation: Wound Preparation: Ulcer Cleansing: Ulcer Cleansing: Ulcer Cleansing: Rinsed/Irrigated with Rinsed/Irrigated with Rinsed/Irrigated with Saline Saline Saline Topical Anesthetic Topical Anesthetic Topical Anesthetic Applied: Other: Lidocaine Applied: Other: Lidocaine Applied: Other: lidocaine 4% Cream 4% Ointment 4% Treatment Notes Electronic Signature(s) Signed: 12/21/2014  5:45:27 PM By: Montey Hora Entered By: Montey Hora on 12/21/2014 10:57:59 [name] ([id number]) -------------------------------------------------------------------------------- Multi-Disciplinary Care Plan Details Patient Name: [name] Date of Service: 12/21/2014 10:45 AM Medical Record Number: [id number] Patient Account Number: [id number] Date of Birth/Sex: 08-07-1928 (81 y.o. Female) Treating RN: Montey Hora Primary Care Physician: Constance Goltz Other Clinician: Referring Physician: Constance Goltz Treating Physician/Extender: Frann Rider in Treatment: 2 Active Inactive Abuse / Safety / Falls / Self Care Management Nursing Diagnoses: Impaired physical mobility Potential for falls Goals: Patient will remain injury free Date Initiated: 09/12/2014 Goal Status: Active Patient/caregiver will verbalize understanding of skin care regimen Date Initiated: 09/12/2014 Goal Status: Active Patient/caregiver will verbalize/demonstrate measures taken to prevent injury and/or falls Date Initiated: 09/12/2014 Goal Status: Active Patient/caregiver will verbalize/demonstrate understanding of what to do in case of emergency Date Initiated: 09/12/2014 Goal Status: Active Interventions: Assess fall risk on admission and as needed Assess: immobility, friction, shearing, incontinence upon admission and as needed Assess impairment of mobility on admission and as needed per policy Provide education on basic hygiene Provide education on fall prevention Provide education on personal and home safety Provide education on safe transfers Treatment Activities: Education provided on Basic Hygiene : 10/12/2014 Notes: Necrotic Tissue BERGEN, JUSTIN ([id number]) Nursing Diagnoses: Impaired tissue integrity related to necrotic/devitalized tissue Knowledge deficit related  to management of necrotic/devitalized tissue Goals: Necrotic/devitalized tissue will be minimized in the wound  bed Date Initiated: 09/12/2014 Goal Status: Active Patient/caregiver will verbalize understanding of reason and process for debridement of necrotic tissue Date Initiated: 09/12/2014 Goal Status: Active Interventions: Assess patient pain level pre-, during and post procedure and prior to discharge Provide education on necrotic tissue and debridement process Treatment Activities: Apply topical anesthetic as ordered : 12/21/2014 Enzymatic debridement : 12/21/2014 Excisional debridement : 12/21/2014 Notes: Orientation to the Wound Care Program Nursing Diagnoses: Knowledge deficit related to the wound healing center program Goals: Patient/caregiver will verbalize understanding of the [place] Program Date Initiated: 09/12/2014 Goal Status: Active Interventions: Provide education on orientation to the wound center Notes: Pressure Nursing Diagnoses: Knowledge deficit related to causes and risk factors for pressure ulcer development Knowledge deficit related to management of pressures ulcers Potential for impaired tissue integrity related to pressure, friction, moisture, and shear GoalsEUNIQUA, KALISTA ([id number]) Patient will remain free from development of additional pressure ulcers Date Initiated: 09/12/2014 Goal Status: Active Patient will remain free of pressure ulcers Date Initiated: 09/12/2014 Goal Status: Active Patient/caregiver will verbalize risk factors for pressure ulcer development Date Initiated: 09/12/2014 Goal Status: Active Patient/caregiver will verbalize understanding of pressure ulcer management Date Initiated: 09/12/2014 Goal Status: Active Interventions: Assess: immobility, friction, shearing, incontinence upon admission and as needed Assess offloading mechanisms upon admission and as needed Assess potential for pressure ulcer upon admission and as needed Provide education on pressure ulcers Treatment Activities: Pressure reduction/relief device ordered  : 12/21/2014 Notes: Wound/Skin Impairment Nursing Diagnoses: Impaired tissue integrity Knowledge deficit related to ulceration/compromised skin integrity Goals: Patient/caregiver will verbalize understanding of skin care regimen Date Initiated: 09/12/2014 Goal Status: Active Ulcer/skin breakdown will heal within 14 weeks Date Initiated: 09/12/2014 Goal Status: Active Interventions: Assess patient/caregiver ability to obtain necessary supplies Assess patient/caregiver ability to perform ulcer/skin care regimen upon admission and as needed Assess ulceration(s) every visit Provide education on ulcer and skin care Treatment Activities: Skin care regimen initiated : 12/21/2014 LANAJA, VILLELA ([id number]) Topical wound management initiated : 12/21/2014 Notes: Electronic Signature(s) Signed: 12/21/2014 5:45:27 PM By: Montey Hora Entered By: Montey Hora on 12/21/2014 10:57:35 [name] ([id number]) -------------------------------------------------------------------------------- Pain Assessment Details Patient Name: [name] Date of Service: 12/21/2014 10:45 AM Medical Record Number: [id number] Patient Account Number: [id number] Date of Birth/Sex: Jul 16, 1928 (81 y.o. Female) Treating RN: Baruch Gouty, RN, BSN, Velva Harman Primary Care Physician: Constance Goltz Other Clinician: Referring Physician: Constance Goltz Treating Physician/Extender: Frann Rider in Treatment: 14 Active Problems Location of Pain Severity and Description of Pain Patient Has Paino Yes Site Locations Rate the pain. Current Pain Level: 4 Character of Pain Describe the Pain: Tender Pain Management and Medication Current Pain Management: Medication: Yes Rest: Yes How does your pain impact your activities of daily livingo Sleep: Yes Bathing: Yes Appetite: Yes Relationship With Others: Yes Bladder Continence: Yes Emotions: Yes Bowel Continence: Yes Work: Yes Toileting: Yes Drive: Yes Dressing:  Yes Hobbies: Yes Engineer, maintenance) Signed: 12/21/2014 5:21:09 PM By: Regan Lemming BSN, RN Entered By: Regan Lemming on 12/21/2014 10:50:15 [name] ([id number]) -------------------------------------------------------------------------------- Patient/Caregiver Education Details Patient Name: [name] Date of Service: 12/21/2014 10:45 AM Medical Record Number: [id number] Patient Account Number: [id number] Date of Birth/Gender: [birth year] (81 y.o. Female) Treating RN: Afful, RN, BSN, Velva Harman Primary Care Physician: Constance Goltz Other Clinician: Referring Physician: Constance Goltz Treating Physician/Extender: Frann Rider in Treatment: 14 Education Assessment Education Provided To: Patient Education Topics Provided Wound/Skin Impairment: Handouts: Caring for  Your Ulcer Methods: Explain/Verbal Responses: State content correctly Electronic Signature(s) Signed: 12/21/2014 5:21:09 PM By: Regan Lemming BSN, RN Entered By: Regan Lemming on 12/21/2014 11:15:41 [name] ([id number]) -------------------------------------------------------------------------------- Wound Assessment Details Patient Name: [name] Date of Service: 12/21/2014 10:45 AM Medical Record Number: [id number] Patient Account Number: [id number] Date of Birth/Sex: 06-04-1928 (81 y.o. Female) Treating RN: Afful, RN, BSN, Velva Harman Primary Care Physician: Constance Goltz Other Clinician: Referring Physician: Constance Goltz Treating Physician/Extender: Frann Rider in Treatment: 14 Wound Status Wound Number: 1 Primary Diabetic Wound/Ulcer of the Lower Etiology: Extremity Wound Location: Left Calcaneous - Lateral Secondary Pressure Ulcer Wounding Event: Pressure Injury Etiology: Date Acquired: 07/24/2014 Wound Open Weeks Of Treatment: 14 Status: Clustered Wound: No Comorbid Glaucoma, Asthma, Hypertension, History: Peripheral Venous Disease, Type II Diabetes Photos Photo Uploaded By: Regan Lemming  on 12/21/2014 17:07:37 Wound Measurements Length: (cm) 1 Width: (cm) 1.2 Depth: (cm) 0.5 Area: (cm) 0.942 Volume: (cm) 0.471 % Reduction in Area: 81.5% % Reduction in Volume: 76.9% Epithelialization: Small (1-33%) Tunneling: No Wound Description Classification: Grade 1 Wound Margin: Distinct, outline attached Exudate Amount: Small Exudate Type: Serosanguineous Exudate Color: red, brown Foul Odor After Cleansing: No Wound Bed Granulation Amount: Medium (34-66%) Exposed Structure Granulation Quality: Pink Fascia Exposed: No [place], [place] ([id number]) Necrotic Amount: Small (1-33%) Fat Layer Exposed: No Necrotic Quality: Adherent Slough Tendon Exposed: No Muscle Exposed: No Joint Exposed: No Bone Exposed: No Limited to Skin Breakdown Periwound Skin Texture Texture Color No Abnormalities Noted: No No Abnormalities Noted: No Callus: No Atrophie Blanche: No Crepitus: No Cyanosis: No Excoriation: No Ecchymosis: No Fluctuance: No Erythema: No Friable: No Hemosiderin Staining: No Induration: No Mottled: Yes Localized Edema: Yes Pallor: No Rash: No Rubor: No Scarring: No Temperature / Pain Moisture Temperature: No Abnormality No Abnormalities Noted: No Tenderness on Palpation: Yes Dry / Scaly: No Maceration: No Moist: Yes Wound Preparation Ulcer Cleansing: Rinsed/Irrigated with Saline Topical Anesthetic Applied: Other: Lidocaine 4% Cream, Treatment Notes Wound #1 (Left, Lateral Calcaneous) 1. Cleansed with: Clean wound with Normal Saline 2. Anesthetic Topical Lidocaine 4% cream to wound bed prior to debridement 4. Dressing Applied: Prisma Ag 5. Secondary Dressing Applied ABD and Kerlix/Conform 7. Secured with Recruitment consultant) Signed: 12/21/2014 5:21:09 PM By: Regan Lemming BSN, RN Entered By: Regan Lemming on 12/21/2014 10:47:42 KAMANI, SCHROEDER ([id number]) [place], Dezyrae  ([id number]) -------------------------------------------------------------------------------- Wound Assessment Details Patient Name: [name] Date of Service: 12/21/2014 10:45 AM Medical Record Number: [id number] Patient Account Number: [id number] Date of Birth/Sex: November 15, 1927 (81 y.o. Female) Treating RN: Afful, RN, BSN, Velva Harman Primary Care Physician: Constance Goltz Other Clinician: Referring Physician: Constance Goltz Treating Physician/Extender: Frann Rider in Treatment: 14 Wound Status Wound Number: 5 Primary Diabetic Wound/Ulcer of the Lower Etiology: Extremity Wound Location: Right Calcaneous Wound Open Wounding Event: Gradually Appeared Status: Date Acquired: 09/26/2014 Comorbid Glaucoma, Asthma, Hypertension, Weeks Of Treatment: 11 History: Peripheral Venous Disease, Type II Clustered Wound: No Diabetes Photos Photo Uploaded By: Regan Lemming on 12/21/2014 17:07:38 Wound Measurements Length: (cm) 1.4 Width: (cm) 1.4 Depth: (cm) 0.5 Area: (cm) 1.539 Volume: (cm) 0.77 % Reduction in Area: 72% % Reduction in Volume: -40% Epithelialization: Small (1-33%) Tunneling: No Undermining: No Wound Description Classification: Grade 2 Wound Margin: Distinct, outline attached Exudate Amount: Medium Exudate Type: Serosanguineous Exudate Color: red, brown Foul Odor After Cleansing: No Wound Bed Granulation Amount: Medium (34-66%) Exposed Structure Granulation Quality: Pink Fascia Exposed: No Necrotic Amount: Medium (34-66%) Fat Layer Exposed: No Necrotic Quality: Adherent Slough Tendon Exposed: No [place], [place] ([id number])  Muscle Exposed: No Joint Exposed: No Bone Exposed: No Limited to Skin Breakdown Periwound Skin Texture Texture Color No Abnormalities Noted: No No Abnormalities Noted: No Callus: No Atrophie Blanche: No Crepitus: No Cyanosis: No Excoriation: No Ecchymosis: No Fluctuance: No Erythema: No Friable: No Hemosiderin Staining:  No Induration: No Mottled: Yes Localized Edema: Yes Pallor: No Rash: No Rubor: No Scarring: No Temperature / Pain Moisture Temperature: No Abnormality No Abnormalities Noted: No Tenderness on Palpation: Yes Dry / Scaly: No Maceration: No Moist: Yes Wound Preparation Ulcer Cleansing: Rinsed/Irrigated with Saline Topical Anesthetic Applied: Other: Lidocaine 4% Ointment, Treatment Notes Wound #5 (Right Calcaneous) 1. Cleansed with: Clean wound with Normal Saline 4. Dressing Applied: Santyl Ointment 5. Secondary Dressing Applied Gauze and Kerlix/Conform 7. 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 Female) Treating RN: Afful, RN, BSN, Velva Harman Primary Care Physician: Constance Goltz Other Clinician: Referring Physician: Constance Goltz Treating Physician/Extender: Frann Rider in Treatment: 14 Wound Status Wound Number: 6 Primary Diabetic Wound/Ulcer of the Lower Etiology: Extremity Wound Location: Left Achilles Wound Open Wounding Event: Gradually Appeared Status: Date Acquired: 09/26/2014 Comorbid Glaucoma, Asthma, Hypertension, Weeks Of Treatment: 11 History: Peripheral Venous Disease, Type II Clustered Wound: No Diabetes Photos Photo Uploaded By: Regan Lemming on 12/21/2014 17:08:40 Wound Measurements Length: (cm) 1 Width: (cm) 0.8 Depth: (cm) 0.1 Area: (cm) 0.628 Volume: (cm) 0.063 % Reduction in Area: 83.3% % Reduction in Volume: 96.7% Epithelialization: Small (1-33%) Tunneling: No Undermining: No Wound  Description Classification: Grade 1 Wound Margin: Distinct, outline attached Exudate Amount: Small Exudate Type: Serosanguineous Exudate Color: red, brown Foul Odor After Cleansing: No Wound Bed Granulation Amount: Medium (34-66%) Exposed Structure Granulation Quality: Pink Fascia Exposed: No Necrotic Amount: Small (1-33%) Fat Layer Exposed: No Necrotic Quality: Adherent Slough Tendon Exposed: No [place], Ashayla ([id number]) Muscle Exposed: No Joint Exposed: No Bone Exposed: No Limited to Skin Breakdown Periwound Skin Texture Texture Color No Abnormalities Noted: No No Abnormalities Noted: No Callus: No Atrophie Blanche: No Crepitus: No Cyanosis: No Excoriation: No Ecchymosis: No Fluctuance: No Erythema: No Friable: No Hemosiderin Staining: No Induration: No Mottled: No Localized Edema: Yes Pallor: No Rash: No Rubor: No Scarring: No Temperature / Pain Moisture Temperature: No Abnormality No Abnormalities Noted: No Tenderness on Palpation: Yes Dry / Scaly: No Maceration: No Moist: Yes Wound Preparation Ulcer Cleansing: Rinsed/Irrigated with Saline Topical Anesthetic Applied: Other: lidocaine 4%, Treatment Notes Wound #6 (Left Achilles) 1. Cleansed with: Clean wound with Normal Saline 2. Anesthetic Topical Lidocaine 4% cream to wound bed prior to debridement 4. Dressing Applied: Prisma Ag 5. Secondary Dressing Applied ABD and Kerlix/Conform 7. Secured with Recruitment consultant) Signed: 12/21/2014 5:21:09 PM By: Regan Lemming BSN, RN Entered By: Regan Lemming on 12/21/2014 10:49:21 ZIMAL, BIEDRON ([id number]) -------------------------------------------------------------------------------- Vitals Details Patient Name: [name] Date of Service: 12/21/2014 10:45 AM Medical Record Number: [id number] Patient Account Number: [id number] Date of Birth/Sex: Sep 04, 1927 (81 y.o. Female) Treating RN: Afful, RN, BSN, Velva Harman Primary Care Physician: Constance Goltz Other Clinician: Referring Physician: Constance Goltz Treating Physician/Extender: Frann Rider in Treatment: 14 Vital Signs Time Taken: 10:15 Temperature (F): 98.2 Height (in): 64 Pulse (bpm): 72 Weight (lbs): 180 Respiratory Rate (breaths/min): 18 Body Mass Index (BMI): 30.9 Blood  Pressure (mmHg): 124/84 Reference Range: 80 - 120 mg / dl Electronic Signature(s) Signed: 12/21/2014 5:21:09 PM By: Regan Lemming BSN, RN Entered By: Regan Lemming on 12/21/2014 10:50:19

## 2014-12-22 NOTE — Progress Notes (Signed)
[name], [name] ([id number]) Visit Report for 12/22/2014 Arrival Information Details Patient Name: [name], [name] Date of Service: 12/22/2014 8:00 AM Medical Record Number: [id number] Patient Account Number: [id number] Date of Birth/Sex: February 01, 1928 (81 y.o. Female) Treating RN: Primary Care Physician: Constance Goltz Other Clinician: Referring Physician: Constance Goltz Treating Physician/Extender: Frann Rider in Treatment: 14 Visit Information History Since Last Visit Added or deleted any medications: No Patient Arrived: Wheel Chair Any new allergies or adverse reactions: No Arrival Time: 07:55 Had a fall or experienced change in No Accompanied By: Delfino Lovett, activities of daily living that may affect transport risk of falls: Transfer Assistance: Manual Signs or symptoms of abuse/neglect since last No Patient Identification Verified: Yes visito Secondary Verification Process Yes Hospitalized since last visit: No Completed: Has Dressing in Place as Prescribed: Yes Patient Requires Transmission- No Pain Present Now: No Based Precautions: Patient Has Alerts: Yes Patient Alerts: Patient on Blood Thinner DMII Plavix ABI L 0.63 R 0.79 Electronic Signature(s) Signed: 12/22/2014 12:49:40 PM By: Lorine Bears RCP, RRT, CHT Entered By: Lorine Bears on 12/22/2014 08:27:49 [name] ([id number]) -------------------------------------------------------------------------------- Encounter Discharge Information Details Patient Name: [name] Date of Service: 12/22/2014 8:00 AM Medical Record Number: [id number] Patient Account Number: [id number] Date of Birth/Sex: 08-31-1927 (81 y.o. Female) Treating RN: Primary Care Physician: Constance Goltz Other Clinician: Jacqulyn Bath Referring Physician: Constance Goltz Treating Physician/Extender: Frann Rider in Treatment: 10 Encounter Discharge Information Items Discharge Pain Level: 0 Discharge  Condition: Stable Ambulatory Status: Wheelchair Nursing Discharge Destination: Home Transportation: Other [name], Accompanied By: transport Schedule Follow-up Appointment: No Medication Reconciliation completed No and provided to Patient/Care Jervon Ream: Clinical Summary of Care: Notes Patient has an HBO treatment scheduled on 12/25/14 at 08:00 am. Electronic Signature(s) Signed: 12/22/2014 12:49:40 PM By: Lorine Bears RCP, RRT, CHT Entered By: Lorine Bears on 12/22/2014 10:37:45 [place], Stephanie ([id number]) -------------------------------------------------------------------------------- Vitals Details Patient Name: [name] Date of Service: 12/22/2014 8:00 AM Medical Record Number: [id number] Patient Account Number: [id number] Date of Birth/Sex: 1927/12/22 (81 y.o. Female) Treating RN: Primary Care Physician: Constance Goltz Other Clinician: Referring Physician: Constance Goltz Treating Physician/Extender: Frann Rider in Treatment: 14 Vital Signs Time Taken: 07:55 Temperature (F): 98.3 Height (in): 64 Pulse (bpm): 72 Weight (lbs): 180 Respiratory Rate (breaths/min): 18 Body Mass Index (BMI): 30.9 Blood Pressure (mmHg): 104/60 Reference Range: 80 - 120 mg / dl Electronic Signature(s) Signed: 12/22/2014 12:49:40 PM By: Lorine Bears RCP, RRT, CHT Entered By: Becky Sax, Amado Nash on 12/22/2014 [id number]

## 2014-12-22 NOTE — Progress Notes (Signed)
[name], [name] ([id number]) Visit Report for 12/22/2014 HBO Details Patient Name: [name], [name] Date of Service: 12/22/2014 8:00 AM Medical Record Number: [id number] Patient Account Number: [id number] Date of Birth/Sex: January 24, 1928 (81 y.o. 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, Peripheral HBO Treatment Details Treatment Number: 19 Patient Type: Outpatient Chamber Type: Monoplace Chamber #: [id number] Treatment Protocol: 2.0 ATA with 90 minutes oxygen, and no air breaks Treatment Details Compression Rate Down: 1.5 psi / minute De-Compression Rate Up: 1.5 psi / minute Air breaks and breathing Compress Tx Pressure Decompress Decompress periods Begins Reached Begins Ends (leave unused spaces blank) Chamber Pressure 1 ATA 2.0 ATA - - - - - - 2.0 ATA 1 ATA Clock Time (24 hr) 08:13 08:25 - - - - - - 09:55 10:07 Treatment Length: 114 (minutes) Treatment Segments: 4 Capillary Blood Glucose Pre Capillary Blood Glucose (mg/dl): Post Capillary Blood Glucose (mg/dl): Vital Signs Capillary Blood Glucose Reference Range: 80 - 120 mg / dl HBO Diabetic Blood Glucose Intervention Range: <131 mg/dl or >249 mg/dl Time Vitals Blood Respiratory Capillary Blood Glucose Pulse Action Type: Pulse: Temperature: Taken: Pressure: Rate: Glucose (mg/dl): Meter #: Oximetry (%) Taken: Pre 07:55 104/60 72 18 98.3 Post 10:25 122/84 66 18 97.7 Treatment Response Well [place], Monice ([id number]) Treatment Toleration: Treatment Treatment Completed without Adverse Event Completion Status: HBO Attestation I certify that I  supervised this HBO treatment in accordance with Medicare guidelines. A trained Yes emergency response team is readily available per hospital policies and procedures. Continue HBOT as ordered. Yes Electronic Signature(s) Signed: 12/22/2014 12:08:58 PM By: Christin Fudge MD, FACS Entered By: Christin Fudge on 12/22/2014 11:59:49 [name] ([id number]) -------------------------------------------------------------------------------- HBO Safety Checklist Details Patient Name: [name] Date of Service: 12/22/2014 8:00 AM Medical Record Number: [id number] Patient Account Number: [id number] Date of Birth/Sex: Jul 01, 1928 (81 y.o. Female) Treating RN: Primary Care Physician: Constance Goltz Other Clinician: Referring Physician: Constance Goltz Treating Physician/Extender: Frann Rider in Treatment: 14 HBO Safety Checklist Items Safety Checklist Consent Form Signed Patient voided / foley secured and emptied When did you last eato 07:00 am Last dose of injectable or oral agent n/a Ostomy pouch emptied and vented if applicable n/a All implantable devices assessed, documented and approved n/a Intravenous access site secured and place n/a Valuables secured Linens and cotton and cotton/polyester blend (less than 51% polyester) Personal oil-based products / skin lotions / body lotions removed Wigs or hairpieces removed Smoking or tobacco materials removed Books / newspapers / magazines / loose paper removed Cologne, aftershave, perfume and deodorant removed Jewelry removed (may wrap wedding band) Make-up removed Hair care products removed Battery operated devices (external) removed Heating patches and chemical warmers removed Titanium eyewear removed Nail polish cured greater than 10 hours n/a Casting material cured greater than 10 hours n/a Hearing aids removed n/a Loose dentures or partials removed Prosthetics have been removed n/a Patient demonstrates correct use of air  break device (if applicable) Patient concerns have been addressed Patient grounding bracelet on and cord attached to chamber Specifics for Inpatients (complete in addition to above) Medication sheet sent with patient Intravenous medications needed or due during therapy sent with patient [name], [name] ([id number]) Drainage tubes (e.g. nasogastric tube or chest tube secured and vented) Endotracheal or  Tracheotomy tube secured Cuff deflated of air and inflated with saline Airway suctioned Electronic Signature(s) Signed: 12/22/2014 12:49:40 PM By: Lorine Bears RCP, RRT, CHT Entered By: Lorine Bears on 12/22/2014 08:29:50

## 2014-12-25 ENCOUNTER — Encounter: Payer: Medicaid Other | Admitting: Surgery

## 2014-12-25 DIAGNOSIS — L97529 Non-pressure chronic ulcer of other part of left foot with unspecified severity: Secondary | ICD-10-CM | POA: Diagnosis not present

## 2014-12-25 NOTE — Progress Notes (Signed)
COBIE, PIZER ([id number]) Visit Report for 12/25/2014 HBO Details Patient Name: [name], [name] Date of Service: 12/25/2014 8:00 AM Medical Record Number: [id number] Patient Account Number: [id number] Date of Birth/Sex: 1927/10/30 (81 y.o. 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, Peripheral HBO Treatment Details Treatment Number: 20 Patient Type: Outpatient Chamber Type: Monoplace Chamber #: [id number] Treatment Protocol: 2.0 ATA with 90 minutes oxygen, and no air breaks Treatment Details Compression Rate Down: 1.5 psi / minute De-Compression Rate Up: 1.5 psi / minute Air breaks and breathing Compress Tx Pressure Decompress Decompress periods Begins Reached Begins Ends (leave unused spaces blank) Chamber Pressure 1 ATA 2.0 ATA - - - - - - 2.0 ATA 1 ATA Clock Time (24 hr) 08:09 08:21 - - - - - - 09:52 10:04 Treatment Length: 115 (minutes) Treatment Segments: 4 Capillary Blood Glucose Pre Capillary Blood Glucose (mg/dl): Post Capillary Blood Glucose (mg/dl): Vital Signs Capillary Blood Glucose Reference Range: 80 - 120 mg / dl HBO Diabetic Blood Glucose Intervention Range: <131 mg/dl or >249 mg/dl Time Vitals Blood Respiratory Capillary Blood Glucose Pulse Action Type: Pulse: Temperature: Taken: Pressure: Rate: Glucose (mg/dl): Meter #: Oximetry (%) Taken: Pre 07:50 104/62 72 18 96.4 Post 10:20 114/70 66 18 98.4 Treatment Response Well [place], Soleia ([id number]) Treatment Toleration: Treatment Treatment Completed without Adverse Event Completion Status: HBO Attestation I certify that I  supervised this HBO treatment in accordance with Medicare guidelines. A trained Yes emergency response team is readily available per hospital policies and procedures. Continue HBOT as ordered. Yes Electronic Signature(s) Signed: 12/25/2014 5:02:53 PM By: Christin Fudge MD, FACS Entered By: Christin Fudge on 12/25/2014 11:24:33 AUBREY, TEER ([id number]) -------------------------------------------------------------------------------- HBO Safety Checklist Details Patient Name: [name] Date of Service: 12/25/2014 8:00 AM Medical Record Number: [id number] Patient Account Number: [id number] Date of Birth/Sex: [birth year] (81 y.o. Female) Treating RN: Primary Care Physician: Constance Goltz Other Clinician: Jacqulyn Bath Referring Physician: Constance Goltz Treating Physician/Extender: Frann Rider in Treatment: 14 HBO Safety Checklist Items Safety Checklist Consent Form Signed Patient voided / foley secured and emptied When did you last eato 07:00 am Last dose of injectable or oral agent n/a Ostomy pouch emptied and vented if applicable n/a All implantable devices assessed, documented and approved n/a Intravenous access site secured and place n/a Valuables secured Linens and cotton and cotton/polyester blend (less than 51% polyester) Personal oil-based products / skin lotions / body lotions removed Wigs or hairpieces removed Smoking or tobacco materials removed Books / newspapers / magazines / loose paper removed Cologne, aftershave, perfume and deodorant removed Jewelry removed (may wrap wedding band) Make-up removed Hair care products removed Battery operated devices (external) removed Heating patches and chemical warmers removed Titanium eyewear removed Nail polish cured greater than 10 hours n/a Casting material cured greater than 10 hours n/a Hearing aids removed n/a Loose dentures or partials removed Prosthetics have been removed n/a Patient demonstrates correct  use of air break device (if applicable) Patient concerns have been addressed Patient grounding bracelet on and cord attached to chamber Specifics for Inpatients (complete in addition to above) Medication sheet sent with patient Intravenous medications needed or due during therapy sent with patient BRANYA, PETROVA ([id number]) Drainage tubes (e.g. nasogastric tube or chest tube secured and vented)  Endotracheal or Tracheotomy tube secured Cuff deflated of air and inflated with saline Airway suctioned Electronic Signature(s) Signed: 12/25/2014 4:24:42 PM By: Lorine Bears RCP, RRT, CHT Entered By: Becky Sax, Amado Nash on 12/25/2014 08:31:43

## 2014-12-25 NOTE — Progress Notes (Signed)
[name], [name] ([id number]) Visit Report for 12/25/2014 Arrival Information Details Patient Name: [name], [name] Date of Service: 12/25/2014 8:00 AM Medical Record Number: [id number] Patient Account Number: [id number] Date of Birth/Sex: 1928-02-05 (81 y.o. Female) Treating RN: Primary Care Physician: Constance Goltz Other Clinician: Jacqulyn Bath Referring Physician: Constance Goltz Treating Physician/Extender: Frann Rider in Treatment: 14 Visit Information History Since Last Visit Added or deleted any medications: No Patient Arrived: Wheel Chair Any new allergies or adverse reactions: No Arrival Time: 07:50 Had a fall or experienced change in No Accompanied By: Delfino Lovett, activities of daily living that may affect transport risk of falls: Transfer Assistance: Manual Signs or symptoms of abuse/neglect since last No Patient Identification Verified: Yes visito Secondary Verification Process Yes Hospitalized since last visit: No Completed: Has Dressing in Place as Prescribed: Yes Patient Requires Transmission- No Pain Present Now: No Based Precautions: Patient Has Alerts: Yes Patient Alerts: Patient on Blood Thinner DMII Plavix ABI L 0.63 R 0.79 Electronic Signature(s) Signed: 12/25/2014 4:24:42 PM By: Lorine Bears RCP, RRT, CHT Entered By: Lorine Bears on 12/25/2014 08:28:54 [name] ([id number]) -------------------------------------------------------------------------------- Encounter Discharge Information Details Patient Name: [name] Date of Service: 12/25/2014 8:00 AM Medical Record Number: [id number] Patient Account Number: [id number] Date of Birth/Sex: July 28, 1928 (81 y.o. Female) Treating RN: Primary Care Physician: Constance Goltz Other Clinician: Jacqulyn Bath Referring Physician: Constance Goltz Treating Physician/Extender: Frann Rider in Treatment: 14 Encounter Discharge Information Items Discharge Pain  Level: 0 Discharge Condition: Stable Ambulatory Status: Wheelchair Nursing Discharge Destination: Home Transportation: Other [name], Accompanied By: transport Schedule Follow-up Appointment: No Medication Reconciliation completed No and provided to Patient/Care Leisel Pinette: Patient Clinical Summary of Care: Declined Notes Patient has an HBO treatment scheduled on 12/26/14 at 08:00 am. Electronic Signature(s) Signed: 12/25/2014 2:03:26 PM By: Ruthine Dose Entered By: Ruthine Dose on 12/25/2014 14:03:25 [name] ([id number]) -------------------------------------------------------------------------------- Vitals Details Patient Name: [name] Date of Service: 12/25/2014 8:00 AM Medical Record Number: [id number] Patient Account Number: [id number] Date of Birth/Sex: 1927-10-13 (81 y.o. Female) Treating RN: Primary Care Physician: Constance Goltz Other Clinician: Jacqulyn Bath Referring Physician: Constance Goltz Treating Physician/Extender: Frann Rider in Treatment: 14 Vital Signs Time Taken: 07:50 Temperature (F): 96.4 Height (in): 64 Pulse (bpm): 72 Weight (lbs): 180 Respiratory Rate (breaths/min): 18 Body Mass Index (BMI): 30.9 Blood Pressure (mmHg): 104/62 Reference Range: 80 - 120 mg / dl Electronic Signature(s) Signed: 12/25/2014 4:24:42 PM By: Lorine Bears RCP, RRT, CHT Entered By: Lorine Bears on 12/25/2014 08:30:53

## 2014-12-25 NOTE — Progress Notes (Signed)
[name], [name] ([id number]) Visit Report for 12/25/2014 Problem List Details Patient Name: [name], [name] Date of Service: 12/25/2014 8:00 AM Medical Record Number: [id number] Patient Account Number: [id number] Date of Birth/Sex: 1927-09-28 (81 y.o. 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

## 2014-12-26 ENCOUNTER — Encounter: Payer: Medicaid Other | Admitting: Surgery

## 2014-12-26 DIAGNOSIS — L97529 Non-pressure chronic ulcer of other part of left foot with unspecified severity: Secondary | ICD-10-CM | POA: Diagnosis not present

## 2014-12-26 NOTE — Progress Notes (Signed)
[name], [name] ([id number]) Visit Report for 12/26/2014 HBO Details Patient Name: [name], [name] Date of Service: 12/26/2014 8:00 AM Medical Record Number: [id number] Patient Account Number: [id number] Date of Birth/Sex: [birth year] (81 y.o. 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, Peripheral HBO Treatment Details Treatment Number: 21 Patient Type: Outpatient Chamber Type: Monoplace Chamber #: [id number] Treatment Protocol: 2.0 ATA with 90 minutes oxygen, and no air breaks Treatment Details Compression Rate Down: 1.5 psi / minute De-Compression Rate Up: 1.5 psi / minute Air breaks and breathing Compress Tx Pressure Decompress Decompress periods Begins Reached Begins Ends (leave unused spaces blank) Chamber Pressure 1 ATA 2.0 ATA - - - - - - 2.0 ATA 1 ATA Clock Time (24 hr) 08:13 08:25 - - - - - - 09:56 10:08 Treatment Length: 115 (minutes) Treatment Segments: 4 Capillary Blood Glucose Pre Capillary Blood Glucose (mg/dl): Post Capillary Blood Glucose (mg/dl): Vital Signs Capillary Blood Glucose Reference Range: 80 - 120 mg / dl HBO Diabetic Blood Glucose Intervention Range: <131 mg/dl or >249 mg/dl Time Vitals Blood Respiratory Capillary Blood Glucose Pulse Action Type: Pulse: Temperature: Taken: Pressure: Rate: Glucose (mg/dl): Meter #: Oximetry (%) Taken: Pre 07:55 122/70 72 18 98.7 Post 10:30 120/52 78 18 98 Treatment Response Well [place], Darsha ([id number]) Treatment Toleration: Treatment Treatment Completed without Adverse Event Completion Status: HBO Attestation I certify that I  supervised this HBO treatment in accordance with Medicare guidelines. A trained Yes emergency response team is readily available per hospital policies and procedures. Continue HBOT as ordered. Yes Electronic Signature(s) Signed: 12/26/2014 2:25:04 PM By: Christin Fudge MD, FACS Entered By: Christin Fudge on 12/26/2014 10:52:31 [name], [name] ([id number]) -------------------------------------------------------------------------------- HBO Safety Checklist Details Patient Name: [name] Date of Service: 12/26/2014 8:00 AM Medical Record Number: [id number] Patient Account Number: [id number] Date of Birth/Sex: Jan 13, 1928 (81 y.o. Female) Treating RN: Primary Care Physician: Constance Goltz Other Clinician: Jacqulyn Bath Referring Physician: Constance Goltz Treating Physician/Extender: Frann Rider in Treatment: 15 HBO Safety Checklist Items Safety Checklist Consent Form Signed Patient voided / foley secured and emptied When did you last eato 07:00 am Last dose of injectable or oral agent n/a Ostomy pouch emptied and vented if applicable n/a All implantable devices assessed, documented and approved n/a Intravenous access site secured and place n/a Valuables secured Linens and cotton and cotton/polyester blend (less than 51% polyester) Personal oil-based products / skin lotions / body lotions removed Wigs or hairpieces removed Smoking or tobacco materials removed Books / newspapers / magazines / loose paper removed Cologne, aftershave, perfume and deodorant removed Jewelry removed (may wrap wedding band) Make-up removed Hair care products removed Battery operated devices (external) removed Heating patches and chemical warmers removed Titanium eyewear removed Nail polish cured greater than 10 hours n/a Casting material cured greater than 10 hours n/a Hearing aids removed n/a Loose dentures or partials removed Prosthetics have been removed n/a Patient demonstrates  correct use of air break device (if applicable) Patient concerns have been addressed Patient grounding bracelet on and cord attached to chamber Specifics for Inpatients (complete in addition to above) Medication sheet sent with patient Intravenous medications needed or due during therapy sent with patient [name], [name] ([id number]) Drainage tubes (e.g. nasogastric tube or chest tube secured and vented)  Endotracheal or Tracheotomy tube secured Cuff deflated of air and inflated with saline Airway suctioned Electronic Signature(s) Signed: 12/26/2014 3:43:03 PM By: Lorine Bears RCP, RRT, CHT Entered By: Becky Sax, Amado Nash on 12/26/2014 08:24:01

## 2014-12-26 NOTE — Progress Notes (Signed)
[name] ([id number]) Visit Report for 12/26/2014 Arrival Information Details Patient Name: [name], [name] Date of Service: 12/26/2014 8:00 AM Medical Record Number: [id number] Patient Account Number: [id number] Date of Birth/Sex: 26-Nov-1927 (81 y.o. Female) Treating RN: Primary Care Physician: Constance Goltz Other Clinician: Jacqulyn Bath Referring Physician: Constance Goltz Treating Physician/Extender: Frann Rider in Treatment: 15 Visit Information History Since Last Visit Added or deleted any medications: No Patient Arrived: Wheel Chair Any new allergies or adverse reactions: No Arrival Time: 07:55 Had a fall or experienced change in No Accompanied By: Delfino Lovett, activities of daily living that may affect transport risk of falls: Transfer Assistance: Manual Signs or symptoms of abuse/neglect since last No Patient Identification Verified: Yes visito Secondary Verification Process Yes Hospitalized since last visit: No Completed: Has Dressing in Place as Prescribed: Yes Patient Requires Transmission- No Pain Present Now: No Based Precautions: Patient Has Alerts: Yes Patient Alerts: Patient on Blood Thinner DMII Plavix ABI L 0.63 R 0.79 Electronic Signature(s) Signed: 12/26/2014 3:43:03 PM By: Lorine Bears RCP, RRT, CHT Entered By: Lorine Bears on 12/26/2014 08:22:55 [name] ([id number]) -------------------------------------------------------------------------------- Encounter Discharge Information Details Patient Name: [name] Date of Service: 12/26/2014 8:00 AM Medical Record Number: [id number] Patient Account Number: [id number] Date of Birth/Sex: 04/20/1928 (81 y.o. Female) Treating RN: Primary Care Physician: Constance Goltz Other Clinician: Jacqulyn Bath Referring Physician: Constance Goltz Treating Physician/Extender: Frann Rider in Treatment: 15 Encounter Discharge Information Items Discharge Pain  Level: 0 Discharge Condition: Stable Ambulatory Status: Wheelchair Nursing Discharge Destination: Home Transportation: Other [name], Accompanied By: transport Schedule Follow-up Appointment: No Medication Reconciliation completed No and provided to Patient/Care [name]: Clinical Summary of Care: Notes Patient has an HBO treatment scheduled on 12/27/14 at 08:00 am. Electronic Signature(s) Signed: 12/26/2014 3:43:03 PM By: Lorine Bears RCP, RRT, CHT Entered By: Lorine Bears on 12/26/2014 10:47:48 [name], [name] ([id number]) -------------------------------------------------------------------------------- Vitals Details Patient Name: [name] Date of Service: 12/26/2014 8:00 AM Medical Record Number: [id number] Patient Account Number: [id number] Date of Birth/Sex: Aug 21, 1927 (81 y.o. Female) Treating RN: Primary Care Physician: Constance Goltz Other Clinician: Jacqulyn Bath Referring Physician: Constance Goltz Treating Physician/Extender: Frann Rider in Treatment: 15 Vital Signs Time Taken: 07:55 Temperature (F): 98.7 Height (in): 64 Pulse (bpm): 72 Weight (lbs): 180 Respiratory Rate (breaths/min): 18 Body Mass Index (BMI): 30.9 Blood Pressure (mmHg): 122/70 Reference Range: 80 - 120 mg / dl Electronic Signature(s) Signed: 12/26/2014 3:43:03 PM By: Lorine Bears RCP, RRT, CHT Entered By: Lorine Bears on 12/26/2014 08:23:16

## 2014-12-27 ENCOUNTER — Encounter: Payer: Medicaid Other | Admitting: Surgery

## 2014-12-27 DIAGNOSIS — L97529 Non-pressure chronic ulcer of other part of left foot with unspecified severity: Secondary | ICD-10-CM | POA: Diagnosis not present

## 2014-12-27 NOTE — Progress Notes (Signed)
TIRENIOLUWA, TELFAIR ([id number]) Visit Report for 12/27/2014 HBO Details Patient Name: [name], [name] Date of Service: 12/27/2014 8:00 AM Medical Record Number: [id number] Patient Account Number: [id number] Date of Birth/Sex: [birth year] (81 y.o. 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, Peripheral HBO Treatment Details Treatment Number: 22 Patient Type: Outpatient Chamber Type: Monoplace Chamber #: [id number] Treatment Protocol: 2.0 ATA with 90 minutes oxygen, and no air breaks Treatment Details Compression Rate Down: 1.5 psi / minute De-Compression Rate Up: 1.5 psi / minute Air breaks and breathing Compress Tx Pressure Decompress Decompress periods Begins Reached Begins Ends (leave unused spaces blank) Chamber Pressure 1 ATA 2.0 ATA - - - - - - 2.0 ATA 1 ATA Clock Time (24 hr) 08:13 08:25 - - - - - - 09:56 10:08 Treatment Length: 115 (minutes) Treatment Segments: 4 Capillary Blood Glucose Pre Capillary Blood Glucose (mg/dl): Post Capillary Blood Glucose (mg/dl): Vital Signs Capillary Blood Glucose Reference Range: 80 - 120 mg / dl HBO Diabetic Blood Glucose Intervention Range: <131 mg/dl or >249 mg/dl Time Vitals Blood Respiratory Capillary Blood Glucose Pulse Action Type: Pulse: Temperature: Taken: Pressure: Rate: Glucose (mg/dl): Meter #: Oximetry (%) Taken: Pre 07:50 102/54 60 18 98.1 Post 10:10 124/78 72 18 96.5 Treatment Response Well [name], [name] ([id number]) Treatment Toleration: Treatment Treatment Completed without Adverse Event Completion Status: HBO Attestation I certify that  I supervised this HBO treatment in accordance with Medicare guidelines. A trained Yes emergency response team is readily available per hospital policies and procedures. Continue HBOT as ordered. Yes Electronic Signature(s) Signed: 12/27/2014 3:52:37 PM By: Loletha Grayer MD Entered By: Loletha Grayer on 12/27/2014 11:08:49 [name] ([id number]) -------------------------------------------------------------------------------- HBO Safety Checklist Details Patient Name: [name] Date of Service: 12/27/2014 8:00 AM Medical Record Number: [id number] Patient Account Number: [id number] Date of Birth/Sex: 06-18-1928 (81 y.o. Female) Treating RN: Primary Care Physician: Constance Goltz Other Clinician: Jacqulyn Bath Referring Physician: Constance Goltz Treating Physician/Extender: BURNS, Charlean Sanfilippo in Treatment: 15 HBO Safety Checklist Items Safety Checklist Consent Form Signed Patient voided / foley secured and emptied When did you last eato 07:00 am Last dose of injectable or oral agent n/a Ostomy pouch emptied and vented if applicable n/a All implantable devices assessed, documented and approved n/a Intravenous access site secured and place n/a Valuables secured Linens and cotton and cotton/polyester blend (less than 51% polyester) Personal oil-based products / skin lotions / body lotions removed Wigs or hairpieces removed Smoking or tobacco materials removed Books / newspapers / magazines / loose paper removed Cologne, aftershave, perfume and deodorant removed Jewelry removed (may wrap wedding band) Make-up removed Hair care products removed Battery operated devices (external) removed Heating patches and chemical warmers removed Titanium eyewear removed Nail polish cured greater than 10 hours n/a Casting material cured greater than 10 hours n/a Hearing aids removed n/a Loose dentures or partials removed Prosthetics have been removed n/a Patient demonstrates  correct use of air break device (if applicable) Patient concerns have been addressed Patient grounding bracelet on and cord attached to chamber Specifics for Inpatients (complete in addition to above) Medication sheet sent with patient Intravenous medications needed or due during therapy sent with patient [name], [name] ([id number]) Drainage tubes (e.g. nasogastric tube or chest tube secured and  vented) Endotracheal or Tracheotomy tube secured Cuff deflated of air and inflated with saline Airway suctioned Electronic Signature(s) Signed: 12/27/2014 4:28:24 PM By: Lorine Bears RCP, RRT, CHT Entered By: Becky Sax, Amado Nash on 12/27/2014 08:19:47

## 2014-12-27 NOTE — Progress Notes (Signed)
AM…LIE, DEAN ([id number]) Visit Report for 12/27/2014 Arrival Information Details Patient Name: [name], [name] Date of Service: 12/27/2014 8:00 AM Medical Record Number: [id number] Patient Account Number: [id number] Date of Birth/Sex: September 03, 1927 (81 y.o. Female) Treating RN: Primary Care Physician: Constance Goltz Other Clinician: Jacqulyn Bath Referring Physician: Constance Goltz Treating Physician/Extender: Olivia Canter in Treatment: 15 Visit Information History Since Last Visit Added or deleted any medications: No Patient Arrived: Wheel Chair Any new allergies or adverse reactions: No Arrival Time: 07:50 Had a fall or experienced change in No Accompanied By: Delfino Lovett, activities of daily living that may affect transport risk of falls: Transfer Assistance: Manual Signs or symptoms of abuse/neglect since last No Patient Identification Verified: Yes visito Secondary Verification Process Yes Hospitalized since last visit: No Completed: Has Dressing in Place as Prescribed: Yes Patient Requires Transmission- No Pain Present Now: No Based Precautions: Patient Has Alerts: Yes Patient Alerts: Patient on Blood Thinner DMII Plavix ABI L 0.63 R 0.79 Electronic Signature(s) Signed: 12/27/2014 4:28:24 PM By: Lorine Bears RCP, RRT, CHT Entered By: Lorine Bears on 12/27/2014 08:18:26 [name] ([id number]) -------------------------------------------------------------------------------- Encounter Discharge Information Details Patient Name: [name] Date of Service: 12/27/2014 8:00 AM Medical Record Number: [id number] Patient Account Number: [id number] Date of Birth/Sex: 1928-05-30 (81 y.o. Female) Treating RN: Primary Care Physician: Constance Goltz Other Clinician: Jacqulyn Bath Referring Physician: Constance Goltz Treating Physician/Extender: Olivia Canter in Treatment: 15 Encounter Discharge Information Items Discharge Pain  Level: 0 Discharge Condition: Stable Ambulatory Status: Wheelchair Nursing Discharge Destination: Home Transportation: Other [name], Accompanied By: transport Schedule Follow-up Appointment: No Medication Reconciliation completed No and provided to Patient/Care Keyanna Sandefer: Clinical Summary of Care: Notes Patient has an HBO treatment scheduled on 12/28/14 at 13:00 pm. Patient has a conflict with transportation at 08:00 am. Electronic Signature(s) Signed: 12/27/2014 4:28:24 PM By: Lorine Bears RCP, RRT, CHT Entered By: Lorine Bears on 12/27/2014 10:55:27 [name] ([id number]) -------------------------------------------------------------------------------- Vitals Details Patient Name: [name] Date of Service: 12/27/2014 8:00 AM Medical Record Number: [id number] Patient Account Number: [id number] Date of Birth/Sex: [birth year] (81 y.o. Female) Treating RN: Primary Care Physician: Constance Goltz Other Clinician: Jacqulyn Bath Referring Physician: Constance Goltz Treating Physician/Extender: BURNS, Charlean Sanfilippo in Treatment: 15 Vital Signs Time Taken: 07:50 Temperature (F): 98.1 Height (in): 64 Pulse (bpm): 60 Weight (lbs): 180 Respiratory Rate (breaths/min): 18 Body Mass Index (BMI): 30.9 Blood Pressure (mmHg): 102/54 Reference Range: 80 - 120 mg / dl Electronic Signature(s) Signed: 12/27/2014 4:28:24 PM By: Lorine Bears RCP, RRT, CHT Entered By: Becky Sax, Amado Nash on 12/27/2014 08:18:58

## 2014-12-28 ENCOUNTER — Encounter: Payer: Medicaid Other | Admitting: Surgery

## 2014-12-28 DIAGNOSIS — L97529 Non-pressure chronic ulcer of other part of left foot with unspecified severity: Secondary | ICD-10-CM | POA: Diagnosis not present

## 2014-12-28 NOTE — Progress Notes (Signed)
OWEDA, DANOFF ([id number]) Visit Report for 12/28/2014 Arrival Information Details Patient Name: [name], [name] Date of Service: 12/28/2014 1:00 PM Medical Record Number: [id number] Patient Account Number: [id number] Date of Birth/Sex: 20-Jan-1928 (81 y.o. Female) Treating RN: Primary Care Physician: Constance Goltz Other Clinician: Jacqulyn Bath Referring Physician: Constance Goltz Treating Physician/Extender: Frann Rider in Treatment: 15 Visit Information History Since Last Visit Added or deleted any medications: No Patient Arrived: Wheel Chair Any new allergies or adverse reactions: No Arrival Time: 12:45 Had a fall or experienced change in No Accompanied By: Delfino Lovett, activities of daily living that may affect transport risk of falls: Transfer Assistance: Manual Signs or symptoms of abuse/neglect since last No Patient Identification Verified: Yes visito Secondary Verification Process Yes Hospitalized since last visit: No Completed: Has Dressing in Place as Prescribed: Yes Patient Requires Transmission- No Pain Present Now: No Based Precautions: Patient Has Alerts: Yes Patient Alerts: Patient on Blood Thinner DMII Plavix ABI L 0.63 R 0.79 Electronic Signature(s) Signed: 12/28/2014 3:47:24 PM By: Lorine Bears RCP, RRT, CHT Entered By: Lorine Bears on 12/28/2014 13:24:58 [name] ([id number]) -------------------------------------------------------------------------------- Encounter Discharge Information Details Patient Name: [name] Date of Service: 12/28/2014 1:00 PM Medical Record Number: [id number] Patient Account Number: [id number] Date of Birth/Sex: 1927-09-17 (81 y.o. Female) Treating RN: Primary Care Physician: Constance Goltz Other Clinician: Jacqulyn Bath Referring Physician: Constance Goltz Treating Physician/Extender: Frann Rider in Treatment: 15 Encounter Discharge Information Items Discharge Pain  Level: 0 Discharge Condition: Stable Ambulatory Status: Wheelchair Nursing Discharge Destination: Home Transportation: Other [name], Accompanied By: transport Schedule Follow-up Appointment: No Medication Reconciliation completed No and provided to Patient/Care Maxi Rodas: Clinical Summary of Care: Notes Patient has an HBO treatment scheduled on 12/29/14 at 08:00 am. Electronic Signature(s) Signed: 12/28/2014 3:47:24 PM By: Lorine Bears RCP, RRT, CHT Entered By: Lorine Bears on 12/28/2014 15:44:13 Crawford, Dalina ([id number]) -------------------------------------------------------------------------------- Vitals Details Patient Name: [name] Date of Service: 12/28/2014 1:00 PM Medical Record Number: [id number] Patient Account Number: [id number] Date of Birth/Sex: [birth year] (81 y.o. Female) Treating RN: Primary Care Physician: Constance Goltz Other Clinician: Jacqulyn Bath Referring Physician: Constance Goltz Treating Physician/Extender: Frann Rider in Treatment: 15 Vital Signs Time Taken: 12:50 Temperature (F): 98.1 Height (in): 64 Pulse (bpm): 72 Weight (lbs): 180 Respiratory Rate (breaths/min): 18 Body Mass Index (BMI): 30.9 Blood Pressure (mmHg): 130/64 Reference Range: 80 - 120 mg / dl Electronic Signature(s) Signed: 12/28/2014 3:47:24 PM By: Lorine Bears RCP, RRT, CHT Entered By: Becky Sax, Amado Nash on 12/28/2014 13:25:22

## 2014-12-28 NOTE — Progress Notes (Signed)
[name], [name] ([id number]) Visit Report for 12/28/2014 HBO Details Patient Name: [name], [name] Date of Service: 12/28/2014 1:00 PM Medical Record Number: [id number] Patient Account Number: [id number] Date of Birth/Sex: Aug 20, 1927 (81 y.o. 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, Peripheral HBO Treatment Details Treatment Number: 23 Patient Type: Outpatient Chamber Type: Monoplace Chamber #: HBO [id number] Treatment Protocol: 2.0 ATA with 90 minutes oxygen, and no air breaks Treatment Details Compression Rate Down: 1.5 psi / minute De-Compression Rate Up: 1.5 psi / minute Air breaks and breathing Compress Tx Pressure Decompress Decompress periods Begins Reached Begins Ends (leave unused spaces blank) Chamber Pressure 1 ATA 2.0 ATA - - - - - - 2.0 ATA 1 ATA Clock Time (24 hr) 13:10 13:20 - - - - - - 14:51 15:01 Treatment Length: 111 (minutes) Treatment Segments: 4 Capillary Blood Glucose Pre Capillary Blood Glucose (mg/dl): Post Capillary Blood Glucose (mg/dl): Vital Signs Capillary Blood Glucose Reference Range: 80 - 120 mg / dl HBO Diabetic Blood Glucose Intervention Range: <131 mg/dl or >249 mg/dl Time Vitals Blood Respiratory Capillary Blood Glucose Pulse Action Type: Pulse: Temperature: Taken: Pressure: Rate: Glucose (mg/dl): Meter #: Oximetry (%) Taken: Pre 12:50 130/64 72 18 98.1 Post 15:10 100/74 78 18 98.1 Treatment Response Well [name], [name] ([id number]) Treatment Toleration: Treatment Treatment Completed without Adverse Event Completion Status: Electronic  Signature(s) Signed: 12/28/2014 3:47:24 PM By: Paulla Fore, RRT, CHT Signed: 12/28/2014 4:49:36 PM By: Christin Fudge MD, FACS Entered By: Lorine Bears on 12/28/2014 15:43:19 [name] ([id number]) -------------------------------------------------------------------------------- HBO Safety Checklist Details Patient Name: [name] Date of Service: 12/28/2014 1:00 PM Medical Record Number: [id number] Patient Account Number: [id number] Date of Birth/Sex: 17-Oct-1927 (81 y.o. Female) Treating RN: Primary Care Physician: Constance Goltz Other Clinician: Jacqulyn Bath Referring Physician: Constance Goltz Treating Physician/Extender: Frann Rider in Treatment: 15 HBO Safety Checklist Items Safety Checklist Consent Form Signed Patient voided / foley secured and emptied When did you last eato 12:00 noon Last dose of injectable or oral agent n/a Ostomy pouch emptied and vented if applicable n/a All implantable devices assessed, documented and approved n/a Intravenous access site secured and place n/a Valuables secured Linens and cotton and cotton/polyester blend (less than 51% polyester) Personal oil-based products / skin lotions / body lotions removed Wigs or hairpieces removed Smoking or tobacco materials removed Books / newspapers / magazines / loose paper removed Cologne, aftershave, perfume and deodorant removed Jewelry removed (may wrap wedding band) Make-up removed Hair care products removed Battery operated devices (external) removed Heating patches and chemical warmers removed Titanium eyewear removed Nail polish cured greater than 10 hours n/a Casting material cured greater than 10 hours n/a Hearing aids removed n/a Loose dentures or partials removed Prosthetics have been removed n/a Patient demonstrates correct use of air break device (if applicable) Patient concerns have been addressed Patient grounding bracelet on and cord  attached to chamber Specifics for Inpatients (complete in addition to above) Medication sheet sent with patient Intravenous medications needed or due during therapy sent with patient [name], [name] ([id number]) Drainage tubes (e.g. nasogastric tube or chest tube secured and vented) Endotracheal or Tracheotomy tube secured Cuff deflated of air and inflated with saline Airway suctioned Electronic Signature(s) Signed: 12/28/2014 3:47:24 PM  By: Lorine Bears RCP, RRT, CHT Entered By: Lorine Bears on 12/28/2014 13:26:07

## 2014-12-29 ENCOUNTER — Encounter: Payer: Medicaid Other | Admitting: Surgery

## 2014-12-29 DIAGNOSIS — L97529 Non-pressure chronic ulcer of other part of left foot with unspecified severity: Secondary | ICD-10-CM | POA: Diagnosis not present

## 2014-12-29 NOTE — Progress Notes (Signed)
KASHARI, KRYSINSKI ([id number]) Visit Report for 12/29/2014 Arrival Information Details Patient Name: [name], [name] Date of Service: 12/29/2014 8:00 AM Medical Record Number: [id number] Patient Account Number: [id number] Date of Birth/Sex: April 23, 1928 (81 y.o. Female) Treating RN: Primary Care Physician: Constance Goltz Other Clinician: Jacqulyn Bath Referring Physician: Constance Goltz Treating Physician/Extender: Frann Rider in Treatment: 15 Visit Information History Since Last Visit Added or deleted any medications: No Patient Arrived: Wheel Chair Any new allergies or adverse reactions: No Arrival Time: 07:55 Had a fall or experienced change in No Accompanied By: Delfino Lovett, activities of daily living that may affect transport risk of falls: Transfer Assistance: Manual Signs or symptoms of abuse/neglect since last No Patient Identification Verified: Yes visito Secondary Verification Process Yes Hospitalized since last visit: No Completed: Has Dressing in Place as Prescribed: Yes Patient Requires Transmission- No Pain Present Now: No Based Precautions: Patient Has Alerts: Yes Patient Alerts: Patient on Blood Thinner DMII Plavix ABI L 0.63 R 0.79 Electronic Signature(s) Signed: 12/29/2014 11:50:17 AM By: Lorine Bears RCP, RRT, CHT Entered By: Lorine Bears on 12/29/2014 08:22:26 [name] ([id number]) -------------------------------------------------------------------------------- Encounter Discharge Information Details Patient Name: [name] Date of Service: 12/29/2014 8:00 AM Medical Record Number: [id number] Patient Account Number: [id number] Date of Birth/Sex: 1928-07-04 (81 y.o. Female) Treating RN: Primary Care Physician: Constance Goltz Other Clinician: Jacqulyn Bath Referring Physician: Constance Goltz Treating Physician/Extender: Frann Rider in Treatment: 15 Encounter Discharge Information Items Discharge Pain  Level: 0 Discharge Condition: Stable Ambulatory Status: Wheelchair Nursing Discharge Destination: Home Transportation: Other [name], Accompanied By: transport Schedule Follow-up Appointment: No Medication Reconciliation completed No and provided to Patient/Care [name]: Clinical Summary of Care: Notes Patient has an HBO treatment scheduled on 01/01/15 at 08:00 am. Electronic Signature(s) Signed: 12/29/2014 11:50:17 AM By: Lorine Bears RCP, RRT, CHT Entered By: Lorine Bears on 12/29/2014 10:36:40 [name] ([id number]) -------------------------------------------------------------------------------- Vitals Details Patient Name: [name] Date of Service: 12/29/2014 8:00 AM Medical Record Number: [id number] Patient Account Number: [id number] Date of Birth/Sex: Jul 04, 1928 (81 y.o. Female) Treating RN: Primary Care Physician: Constance Goltz Other Clinician: Jacqulyn Bath Referring Physician: Constance Goltz Treating Physician/Extender: Frann Rider in Treatment: 15 Vital Signs Time Taken: 07:55 Temperature (F): 98.5 Height (in): 64 Pulse (bpm): 66 Weight (lbs): 180 Respiratory Rate (breaths/min): 18 Body Mass Index (BMI): 30.9 Blood Pressure (mmHg): 102/68 Reference Range: 80 - 120 mg / dl Electronic Signature(s) Signed: 12/29/2014 11:50:17 AM By: Lorine Bears RCP, RRT, CHT Entered By: Becky Sax, Amado Nash on 12/29/2014 08:22:56

## 2014-12-29 NOTE — Progress Notes (Signed)
[name], [name] ([id number]) Visit Report for 12/29/2014 HBO Details Patient Name: [name], [name] Date of Service: 12/29/2014 8:00 AM Medical Record Number: [id number] Patient Account Number: [id number] Date of Birth/Sex: 07-27-1928 (81 y.o. 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, Peripheral HBO Treatment Details Treatment Number: 24 Patient Type: Outpatient Chamber Type: Monoplace Chamber #: [id number] Treatment Protocol: 2.0 ATA with 90 minutes oxygen, and no air breaks Treatment Details Compression Rate Down: 1.5 psi / minute De-Compression Rate Up: 1.5 psi / minute Air breaks and breathing Compress Tx Pressure Decompress Decompress periods Begins Reached Begins Ends (leave unused spaces blank) Chamber Pressure 1 ATA 2.0 ATA - - - - - - 2.0 ATA 1 ATA Clock Time (24 hr) 08:14 08:26 - - - - - - 09:56 10:07 Treatment Length: 113 (minutes) Treatment Segments: 4 Capillary Blood Glucose Pre Capillary Blood Glucose (mg/dl): Post Capillary Blood Glucose (mg/dl): Vital Signs Capillary Blood Glucose Reference Range: 80 - 120 mg / dl HBO Diabetic Blood Glucose Intervention Range: <131 mg/dl or >249 mg/dl Time Vitals Blood Respiratory Capillary Blood Glucose Pulse Action Type: Pulse: Temperature: Taken: Pressure: Rate: Glucose (mg/dl): Meter #: Oximetry (%) Taken: Pre 07:55 102/68 66 18 98.5 Post 10:30 110/78 66 18 98.2 Treatment Response Well [place], Anouk ([id number]) Treatment Toleration: Treatment Treatment Completed without Adverse Event Completion Status: HBO Attestation I certify that  I supervised this HBO treatment in accordance with Medicare guidelines. A trained Yes emergency response team is readily available per hospital policies and procedures. Continue HBOT as ordered. Yes Electronic Signature(s) Signed: 12/29/2014 12:47:20 PM By: Christin Fudge MD, FACS Entered By: Christin Fudge on 12/29/2014 11:12:39 [name] ([id number]) -------------------------------------------------------------------------------- HBO Safety Checklist Details Patient Name: [name] Date of Service: 12/29/2014 8:00 AM Medical Record Number: [id number] Patient Account Number: [id number] Date of Birth/Sex: [birth year] (81 y.o. Female) Treating RN: Primary Care Physician: Constance Goltz Other Clinician: Jacqulyn Bath Referring Physician: Constance Goltz Treating Physician/Extender: Frann Rider in Treatment: 15 HBO Safety Checklist Items Safety Checklist Consent Form Signed Patient voided / foley secured and emptied When did you last eato 07:00 am Last dose of injectable or oral agent n/a Ostomy pouch emptied and vented if applicable n/a All implantable devices assessed, documented and approved n/a Intravenous access site secured and place n/a Valuables secured Linens and cotton and cotton/polyester blend (less than 51% polyester) Personal oil-based products / skin lotions / body lotions removed Wigs or hairpieces removed Smoking or tobacco materials removed Books / newspapers / magazines / loose paper removed Cologne, aftershave, perfume and deodorant removed Jewelry removed (may wrap wedding band) Make-up removed Hair care products removed Battery operated devices (external) removed Heating patches and chemical warmers removed Titanium eyewear removed Nail polish cured greater than 10 hours n/a Casting material cured greater than 10 hours n/a Hearing aids removed n/a Loose dentures or partials removed Prosthetics have been removed n/a Patient demonstrates  correct use of air break device (if applicable) Patient concerns have been addressed Patient grounding bracelet on and cord attached to chamber Specifics for Inpatients (complete in addition to above) Medication sheet sent with patient Intravenous medications needed or due during therapy sent with patient [name], [name] ([id number]) Drainage tubes (e.g. nasogastric tube or chest tube secured and vented)  Endotracheal or Tracheotomy tube secured Cuff deflated of air and inflated with saline Airway suctioned Electronic Signature(s) Signed: 12/29/2014 11:50:17 AM By: Lorine Bears RCP, RRT, CHT Entered By: Becky Sax, Amado Nash on 12/29/2014 08:24:18

## 2014-12-29 NOTE — Progress Notes (Signed)
[name], [name] ([id number]) Visit Report for 12/28/2014 Chief Complaint Document Details Patient Name: [name], [name] Date of Service: 12/28/2014 3:30 PM Medical Record Number: [id number] Patient Account Number: [id number] Date of Birth/Sex: [birth year] (81 y.o. Female) Treating RN: Primary Care Physician: Constance Goltz Other Clinician: Referring Physician: Constance Goltz Treating Physician/Extender: Frann Rider in Treatment: 15 Information Obtained from: Patient Chief Complaint Patient presents to the wound care center today with an open arterial ulcer on the left foot big toe and lateral heal. She also has some superficial ulcerations on the second and third toe dorsum. she has no family member with her today and she is a very poor historian and cannot give a proper history. As stated that she walks around with a walker. Electronic Signature(s) Signed: 12/28/2014 4:49:36 PM By: Christin Fudge MD, FACS Entered By: Christin Fudge on 12/28/2014 16:09:24 [name] ([id number]) -------------------------------------------------------------------------------- HPI Details Patient Name: [name] Date of Service: 12/28/2014 3:30 PM Medical Record Number: [id number] Patient Account Number: [id number] Date of Birth/Sex: 1927/09/20 (81 y.o. Female) Treating RN: Primary Care Physician: Constance Goltz Other Clinician: Referring Physician: Constance Goltz Treating Physician/Extender: Frann Rider in Treatment: 15 History of Present Illness Location: left heel Quality: Patient reports experiencing a dull pain to affected area(s). Severity: Patient states wound are getting worse. Duration: Patient states that they are not certain how long the wound has been present. Timing: Pain in wound is Intermittent (comes and goes Context: The wound appeared gradually over time Associated Signs and Symptoms: Patient reports having difficulty standing for long periods. HPI Description: This  81 year old patient is a poor historian and comes from a nursing home. Does not have any family members with her. Says she has a ulcer on the lateral part of her left foot and some new ones are there on her right foot too. She is unable to say how long she's had these. she does walk with a walker and this is limited most of the day. Does not recall if she has had any vascular studies done. I have reviewed an x-ray done of the left foot which basically shows osteoporosis but no evidence of fracture dislocation or osteomyelitis. 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 She seems to be doing better overall. 10/19/14 -- I understand she has gone to the vascular surgery and lab yesterday and has had all her tests done yesterday. She is doing fine otherwise and has had no fresh issues. 10/26/2014 she seems to be doing well and has no fresh problems and seems much brighter. 10/12/14 -- Taking her antibiotic and continues to do her dressings locally and she is helped by her skilled nursing. She seems to be doing fine and has no fresh issues. 10/19/14 -- she is doing fine and has had no fresh issues. She says her son to go for her vascular workup yesterday and she has been called back after 3 months. we will try and gather these reports to review what they said. 10/26/14 --I have been able to review notes from the vascular surgery office and these were dated from 10/18/2014. He patient was on her first postop visit and had Doppler ultrasounds done of her arteries. She had more than 50% stenosis of the right superficial femoral artery and more than 50% stenosis in the left tibioperoneal trunk. She was status post a  left lower extremity angiogram with angioplasty of the peroneal and left superficial femoral arteries for a nonhealing left  foot and ankle ulceration. note is made of the fact that the ABIs had not changed in the last month since her previous visit. The opinion of her provider was that she did not need any intervention at this time and they had done everything they could at the present time. They would continue on Plavix and antibiotics and see her back in 3 months for a another arterial duplex study of the lower extremities. [name], [name] ([id number]) 11/02/2014 -- she has spoken to her caregivers about coming for HBO 5 times per week for 6-8 weeks and they are agreeable about this and she is willing to undergo hyperbaric oxygen therapy. 11/16/2014 she has been worked up with a EKG and chest x-ray and these were within normal limits. As far as the investigations go she is ready for HBOT. we are awaiting some insurance clearance so that she can start on her hyperbaric oxygen therapy. 11/30/2014 she tolerated hyperbaric oxygen therapy fine and there were no issues with her blood glucose levels. As noted she is not a diabetic and most of her fingerstick blood glucose levels are inaccurate because of the Raynaud's phenomena. She normally has to have your lobe sticks to get any random glucose levels. I have also requested her primary care does a venous blood glucose draw to check her glucose level. 12/21/2014 -- she is tolerating hyperbaric oxygen very well and has overall made a lot of progress. Her left leg is looking much better. Electronic Signature(s) Signed: 12/28/2014 4:49:36 PM By: Christin Fudge MD, FACS Entered By: Christin Fudge on 12/28/2014 [place], [name] ([id number]) -------------------------------------------------------------------------------- Physical Exam Details Patient Name: [name] Date of Service: 12/28/2014 3:30 PM Medical Record Number: [id number] Patient Account Number: [id number] Date of Birth/Sex: Jan 20, 1928 (81 y.o. Female) Treating RN: Primary Care Physician: Constance Goltz Other Clinician: Referring Physician: Constance Goltz Treating Physician/Extender: Frann Rider in Treatment: 15 Constitutional . Pulse regular. Respirations normal and unlabored. Afebrile. . Eyes Nonicteric. Reactive to light. Ears, Nose, Mouth, and Throat Lips, teeth, and gums WNL.Marland Kitchen Moist mucosa without lesions . Neck supple and nontender. No palpable supraclavicular or cervical adenopathy. Normal sized without goiter. Respiratory WNL. No retractions.. Cardiovascular Pedal Pulses WNL. No clubbing, cyanosis or edema. Integumentary (Hair, Skin) all of her wounds have significantly improved especially on the left leg where both the left lateral calcaneum and the left Achilles tendon is almost completely granulated.the right heel has some debris and this will need continuing treatment with Santyl ointment.. No crepitus or fluctuance. No peri-wound warmth or erythema. No masses.Marland Kitchen Psychiatric Judgement and insight Intact.. No evidence of depression, anxiety, or agitation.. Electronic Signature(s) Signed: 12/28/2014 4:49:36 PM By: Christin Fudge MD, FACS Entered By: Christin Fudge on 12/28/2014 16:11:11 [name] ([id number]) -------------------------------------------------------------------------------- Physician Orders Details Patient Name: [name] Date of Service: 12/28/2014 3:30 PM Medical Record Number: [id number] Patient Account Number: [id number] Date of Birth/Sex: 25-Apr-1928 (81 y.o. Female) Treating RN: Montey Hora Primary Care Physician: Constance Goltz Other Clinician: Referring Physician: Constance Goltz Treating Physician/Extender: Frann Rider in Treatment: 15 Verbal / Phone Orders: Yes Clinician: Montey Hora Read Back and Verified: Yes Diagnosis Coding Wound Cleansing Wound #1 Left,Lateral Calcaneous o Clean wound with Normal Saline. - be sure to clean out all remaining prisma Wound #5 Right Calcaneous o Clean wound with Normal  Saline. - be sure to clean out all remaining prisma Wound #6 Left Achilles   o Clean wound with Normal Saline. - be sure to clean out all remaining prisma Anesthetic Wound #1 Left,Lateral Calcaneous o Topical Lidocaine 4% cream applied to wound bed prior to debridement o Hurricaine Topical Anesthetic Spray applied to wound bed prior to debridement Wound #5 Right Calcaneous o Topical Lidocaine 4% cream applied to wound bed prior to debridement o Hurricaine Topical Anesthetic Spray applied to wound bed prior to debridement Wound #6 Left Achilles o Topical Lidocaine 4% cream applied to wound bed prior to debridement o Hurricaine Topical Anesthetic Spray applied to wound bed prior to debridement Primary Wound Dressing Wound #1 Left,Lateral Calcaneous o Prisma Ag Wound #5 Right Calcaneous o Santyl Ointment Wound #6 Left Achilles o Prisma Ag Secondary Dressing Wound #1 Left,Lateral Calcaneous o ABD and Kerlix/Conform [place], [name] ([id number]) Wound #5 Right Calcaneous o ABD and Kerlix/Conform Wound #6 Left Achilles o ABD and Kerlix/Conform Dressing Change Frequency Wound #1 Left,Lateral Calcaneous o Change dressing every day. Wound #5 Right Calcaneous o Change dressing every day. Wound #6 Left Achilles o Change dressing every day. Follow-up Appointments Wound #1 Left,Lateral Calcaneous o Return Appointment in 1 week. Wound #5 Right Calcaneous o Return Appointment in 1 week. Wound #6 Left Achilles o Return Appointment in 1 week. Off-Loading Wound #1 Left,Lateral Calcaneous o Other: - SAGE boots while sitting or lying. Do not wear while ambulating. Wound #5 Right Calcaneous o Other: - SAGE boots while sitting or lying. Do not wear while ambulating. Wound #6 Left Achilles o Other: - SAGE boots while sitting or lying. Do not wear while ambulating. Home Health Wound #1 [place] Visits -  [place] Nurse may visit PRN to address patientos wound care needs. o FACE TO FACE ENCOUNTER: MEDICARE and MEDICAID PATIENTS: I certify that this patient is under my care and that I had a face-to-face encounter that meets the physician face-to-face encounter requirements with this patient on this date. The encounter with the patient was in whole or in part for the following MEDICAL CONDITION: (primary reason for [name]) MEDICAL NECESSITY: I certify, that based on my findings, NURSING services are a medically necessary home health service. HOME BOUND STATUS: I certify that my clinical findings support that this patient is homebound (i.e., Due to illness or injury, pt requires aid of [place], CREMEANS ([id number]) supportive devices such as crutches, cane, wheelchairs, walkers, the use of special transportation or the assistance of another person to leave their place of residence. There is a normal inability to leave the home and doing so requires considerable and taxing effort. Other absences are for medical reasons / religious services and are infrequent or of short duration when for other reasons). o If current dressing causes regression in wound condition, may D/C ordered dressing product/s and apply Normal Saline Moist Dressing daily until next [place] / Other MD appointment. [place] of regression in wound condition at [phone]. o Please direct any NON-WOUND related issues/requests for orders to patient's Primary Care Physician Wound #5 Right [place] Visits - [place] Nurse may visit PRN to address patientos wound care needs. o FACE TO FACE ENCOUNTER: MEDICARE and MEDICAID PATIENTS: I certify that this patient is under my care and that I had a face-to-face encounter that meets the physician face-to-face encounter requirements with this patient on this date. The encounter with the  patient was in whole or in part for the following MEDICAL CONDITION: (primary reason  for Home Healthcare) MEDICAL NECESSITY: I certify, that based on my findings, NURSING services are a medically necessary home health service. HOME BOUND STATUS: I certify that my clinical findings support that this patient is homebound (i.e., Due to illness or injury, pt requires aid of supportive devices such as crutches, cane, wheelchairs, walkers, the use of special transportation or the assistance of another person to leave their place of residence. There is a normal inability to leave the home and doing so requires considerable and taxing effort. Other absences are for medical reasons / religious services and are infrequent or of short duration when for other reasons). o If current dressing causes regression in wound condition, may D/C ordered dressing product/s and apply Normal Saline Moist Dressing daily until next [place] / Other MD appointment. [place] of regression in wound condition at [phone]. o Please direct any NON-WOUND related issues/requests for orders to patient's Primary Care Physician Wound #6 Left Achilles o [place] Visits - [place] Nurse may visit PRN to address patientos wound care needs. o FACE TO FACE ENCOUNTER: MEDICARE and MEDICAID PATIENTS: I certify that this patient is under my care and that I had a face-to-face encounter that meets the physician face-to-face encounter requirements with this patient on this date. The encounter with the patient was in whole or in part for the following MEDICAL CONDITION: (primary reason for [place]) MEDICAL NECESSITY: I certify, that based on my findings, NURSING services are a medically necessary home health service. HOME BOUND STATUS: I certify that my clinical findings support that this patient is homebound (i.e., Due to illness or injury, pt requires aid  of supportive devices such as crutches, cane, wheelchairs, walkers, the use of special transportation or the assistance of another person to leave their place of residence. There is a normal inability to leave the home and doing so requires considerable and taxing effort. Other absences are for medical reasons / religious services and are infrequent or of short duration when for other reasons). o If current dressing causes regression in wound condition, may D/C ordered dressing product/s and apply Normal Saline Moist Dressing daily until next [place] / Other MD appointment. [place] of regression in wound condition at [phone]. [name], [name] ([id number]) o Please direct any NON-WOUND related issues/requests for orders to patient's Primary Care Physician Hyperbaric Oxygen Therapy Wound #1 Left,Lateral Calcaneous o Indication: - acute arterial insufficiency o If appropriate for treatment, begin HBOT per protocol: o 2.0 ATA for 90 Minutes without Air Breaks o One treatment per day (delivered Monday through Friday unless otherwise specified in Special Instructions below): o Total # of Treatments: - 30 Wound #5 Right Calcaneous o Indication: - acute arterial insufficiency o If appropriate for treatment, begin HBOT per protocol: o 2.0 ATA for 90 Minutes without Air Breaks o One treatment per day (delivered Monday through Friday unless otherwise specified in Special Instructions below): o Total # of Treatments: - 30 Wound #6 Left Achilles o Indication: - acute arterial insufficiency o If appropriate for treatment, begin HBOT per protocol: o 2.0 ATA for 90 Minutes without Air Breaks o One treatment per day (delivered Monday through Friday unless otherwise specified in Special Instructions below): o Total # of Treatments: - 30 HBO Contraindications Wound #1 Left,Lateral Calcaneous - Bilateral Lower Extremities o HBO  contraindications of hyperbaric oxygen therapy were reviewed and the patient found to have no untreated pneumothorax or history of spontaneous pneumothorax.   o HBO contraindications of hyperbaric oxygen therapy were reviewed and the patient found to have no history of medications such as Bleomycin, Adriamycin, disulfiram, cisplatin and sulfamylon and is not currently receiving any chemotherapy. o HBO contraindications of hyperbaric oxygen therapy were reviewed and the patient found to have no Upper respiratory infection and chronic sinusitis. o HBO contraindications of hyperbaric oxygen therapy were reviewed and the patient found to have no history of retinal surgery proceeding 6 weeks or intraocular gas o HBO contraindications of hyperbaric oxygen therapy were reviewed and the patient found to have no history seizure disorder or any anticonvulsant medication. o HBO contraindications of hyperbaric oxygen therapy were reviewed and the patient found to have no septicemia with CO2 retention. o HBO contraindications of hyperbaric oxygen therapy were reviewed and the patient found to have no fever greater than 100 degrees. o HBO contraindications of hyperbaric oxygen therapy were reviewed and the patient found to have no pregnancy noted. [place], [name] ([id number]) o HBO contraindications of hyperbaric oxygen therapy were reviewed and the patient found to have no medications such as steroids or narcotics or Phenergan. Wound #5 Right Calcaneous - Bilateral Lower Extremities o HBO contraindications of hyperbaric oxygen therapy were reviewed and the patient found to have no untreated pneumothorax or history of spontaneous pneumothorax. o HBO contraindications of hyperbaric oxygen therapy were reviewed and the patient found to have no history of medications such as Bleomycin, Adriamycin, disulfiram, cisplatin and sulfamylon and is not currently receiving any chemotherapy. o HBO  contraindications of hyperbaric oxygen therapy were reviewed and the patient found to have no Upper respiratory infection and chronic sinusitis. o HBO contraindications of hyperbaric oxygen therapy were reviewed and the patient found to have no history of retinal surgery proceeding 6 weeks or intraocular gas o HBO contraindications of hyperbaric oxygen therapy were reviewed and the patient found to have no history seizure disorder or any anticonvulsant medication. o HBO contraindications of hyperbaric oxygen therapy were reviewed and the patient found to have no septicemia with CO2 retention. o HBO contraindications of hyperbaric oxygen therapy were reviewed and the patient found to have no fever greater than 100 degrees. o HBO contraindications of hyperbaric oxygen therapy were reviewed and the patient found to have no pregnancy noted. o HBO contraindications of hyperbaric oxygen therapy were reviewed and the patient found to have no medications such as steroids or narcotics or Phenergan. Wound #6 Left Achilles - Bilateral Lower Extremities o HBO contraindications of hyperbaric oxygen therapy were reviewed and the patient found to have no untreated pneumothorax or history of spontaneous pneumothorax. o HBO contraindications of hyperbaric oxygen therapy were reviewed and the patient found to have no history of medications such as Bleomycin, Adriamycin, disulfiram, cisplatin and sulfamylon and is not currently receiving any chemotherapy. o HBO contraindications of hyperbaric oxygen therapy were reviewed and the patient found to have no Upper respiratory infection and chronic sinusitis. o HBO contraindications of hyperbaric oxygen therapy were reviewed and the patient found to have no history of retinal surgery proceeding 6 weeks or intraocular gas o HBO contraindications of hyperbaric oxygen therapy were reviewed and the patient found to have no history seizure disorder  or any anticonvulsant medication. o HBO contraindications of hyperbaric oxygen therapy were reviewed and the patient found to have no septicemia with CO2 retention. o HBO contraindications of hyperbaric oxygen therapy were reviewed and the patient found to have no fever greater than 100 degrees. o HBO contraindications of hyperbaric oxygen therapy were reviewed and the  patient found to have no pregnancy noted. o HBO contraindications of hyperbaric oxygen therapy were reviewed and the patient found to have no medications such as steroids or narcotics or Phenergan. Medications-please add to medication list. Wound #5 Right Calcaneous - Bilateral Lower Extremities o Santyl Enzymatic Ointment [name], [name] ([id number]) Electronic Signature(s) Signed: 12/28/2014 4:49:36 PM By: Christin Fudge MD, FACS Signed: 12/28/2014 5:34:11 PM By: Montey Hora Entered By: Montey Hora on 12/28/2014 15:55:04 [name] ([id number]) -------------------------------------------------------------------------------- Problem List Details Patient Name: [name] Date of Service: 12/28/2014 3:30 PM Medical Record Number: [id number] Patient Account Number: [id number] Date of Birth/Sex: 02/16/1928 (81 y.o. 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 Female) Treating RN: Primary Care Physician: Constance Goltz Other Clinician: Referring Physician: Constance Goltz Treating Physician/Extender: Frann Rider in Treatment: 15 Subjective Chief Complaint Information obtained from Patient Patient presents to the wound care center today with an open arterial ulcer on the left foot big toe and lateral heal. She also has some superficial ulcerations on the second and third toe dorsum. she has no family member with her today and she is a very poor historian and cannot give a proper history. As stated that she walks around with a walker. History of Present Illness (HPI) The following HPI elements were documented for the patient's wound: Location: left heel Quality: Patient reports experiencing a dull pain to affected area(s). Severity: Patient states wound are getting worse. Duration: Patient states that they are not certain how long the wound has been present. Timing: Pain in wound is Intermittent (comes and goes Context: The wound appeared gradually over time Associated Signs and Symptoms: Patient reports having difficulty standing for long periods. This 81 year old patient is a poor historian and comes from a nursing home. Does not have any family members with her. Says she has a ulcer on the lateral part of her left foot and some new ones are there on her right foot too. She is unable to say how long she's had these. she does walk with a walker and this is limited most of the day. Does not recall if  she has had any vascular studies done. I have reviewed an x-ray done of the left foot which basically shows osteoporosis but no evidence of fracture dislocation or osteomyelitis. 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 She seems to be doing better overall. 10/19/14 -- I understand she has gone to the vascular surgery and lab yesterday and has had all her tests done yesterday. She is doing fine  otherwise and has had no fresh issues. 10/26/2014 she seems to be doing well and has no fresh problems and seems much brighter. 10/12/14 -- Taking her antibiotic and continues to do her dressings locally and she is helped by her skilled nursing. She seems to be doing fine and has no fresh issues. 10/19/14 -- she is doing fine and has had no fresh issues. She says her son to go for her vascular workup yesterday and she has been called back after 3 months. we will try and gather these reports to review what [name], [name] ([id number]) they said. 10/26/14 --I have been able to review notes from the vascular surgery office and these were dated from 10/18/2014. He patient was on her first postop visit and had Doppler ultrasounds done of her arteries. She had more than 50% stenosis of the right superficial femoral artery and more than 50% stenosis in the left tibioperoneal trunk. She was status post a left lower extremity angiogram with angioplasty of the peroneal and left superficial femoral arteries for a nonhealing left foot and ankle ulceration. note is made of the fact that the ABIs had not changed in the last month since her previous visit. The opinion of her provider was that she did not need any intervention at this time and they had done everything they could at the present time. They would continue on Plavix and antibiotics and see her  back in 3 months for a another arterial duplex study of the lower extremities. 11/02/2014 -- she has spoken to her caregivers about coming for HBO 5 times per week for 6-8 weeks and they are agreeable about this and she is willing to undergo hyperbaric oxygen therapy. 11/16/2014 she has been worked up with a EKG and chest x-ray and these were within normal limits. As far as the investigations go she is ready for HBOT. we are awaiting some insurance clearance so that she can start on her hyperbaric oxygen therapy. 11/30/2014 she tolerated hyperbaric oxygen therapy fine and there were no issues with her blood glucose levels. As noted she is not a diabetic and most of her fingerstick blood glucose levels are inaccurate because of the Raynaud's phenomena. She normally has to have your lobe sticks to get any random glucose levels. I have also requested her primary care does a venous blood glucose draw to check her glucose level. 12/21/2014 -- she is tolerating hyperbaric oxygen very well and has overall made a lot of progress. Her left leg is looking much better. Objective Constitutional Pulse regular. Respirations normal and unlabored. Afebrile. Vitals Time Taken: 3:30 PM, Height: 64 in, Weight: 180 lbs, BMI: 30.9, Temperature: 98.1 F, Pulse: 78 bpm, Respiratory Rate: 18 breaths/min, Blood Pressure: 100/74 mmHg. Eyes Nonicteric. Reactive to light. Ears, Nose, Mouth, and Throat Lips, teeth, and gums WNL.Marland Kitchen Moist mucosa without lesions . Neck supple and nontender. No palpable supraclavicular or cervical adenopathy. Normal sized without goiter. [place], [place] ([id number]) Respiratory WNL. No retractions.. Cardiovascular Pedal Pulses WNL. No clubbing, cyanosis or edema. Psychiatric Judgement and insight Intact.. No evidence of depression, anxiety, or agitation.. Integumentary (Hair, Skin) all of her wounds have significantly improved especially on the left leg where both the left lateral  calcaneum and the left Achilles tendon is almost completely granulated.the right heel has some debris and this will need continuing treatment with Santyl ointment.. No crepitus or fluctuance. No peri-wound warmth or erythema. No masses.. Wound #1 status is Open. Original cause of wound was Pressure Injury. The wound is  located on the Left,Lateral Calcaneous. The wound measures 1cm length x 1cm width x 0.5cm depth; 0.785cm^2 area and 0.393cm^3 volume. The wound is limited to skin breakdown. There is no tunneling or undermining noted. There is a small amount of serous drainage noted. The wound margin is distinct with the outline attached to the wound base. There is medium (34-66%) pink granulation within the wound bed. There is a small (1-33%) amount of necrotic tissue within the wound bed including Adherent Slough. The periwound skin appearance did not exhibit: Callus, Crepitus, Excoriation, Fluctuance, Friable, Induration, Localized Edema, Rash, Scarring, Dry/Scaly, Maceration, Moist, Atrophie Blanche, Cyanosis, Ecchymosis, Hemosiderin Staining, Mottled, Pallor, Rubor, Erythema. Periwound temperature was noted as No Abnormality. The periwound has tenderness on palpation. Wound #5 status is Open. Original cause of wound was Gradually Appeared. The wound is located on the Right Calcaneous. The wound measures 1.1cm length x 0.8cm width x 0.2cm depth; 0.691cm^2 area and 0.138cm^3 volume. The wound is limited to skin breakdown. There is no tunneling or undermining noted. There is a small amount of serosanguineous drainage noted. The wound margin is distinct with the outline attached to the wound base. There is medium (34-66%) pink granulation within the wound bed. There is a medium (34-66%) amount of necrotic tissue within the wound bed including Adherent Slough. The periwound skin appearance exhibited: Hemosiderin Staining. The periwound skin appearance did not exhibit: Callus, Crepitus,  Excoriation, Fluctuance, Friable, Induration, Localized Edema, Rash, Scarring, Dry/Scaly, Maceration, Moist, Atrophie Blanche, Cyanosis, Ecchymosis, Mottled, Pallor, Rubor, Erythema. Periwound temperature was noted as No Abnormality. The periwound has tenderness on palpation. Wound #6 status is Open. Original cause of wound was Gradually Appeared. The wound is located on the Left Achilles. The wound measures 0.3cm length x 0.2cm width x 0.1cm depth; 0.047cm^2 area and 0.005cm^3 volume. The wound is limited to skin breakdown. There is no tunneling or undermining noted. There is a small amount of serous drainage noted. The wound margin is distinct with the outline attached to the wound base. There is large (67-100%) pink granulation within the wound bed. There is a small (1-33%) amount of necrotic tissue within the wound bed including Adherent Slough. The periwound skin appearance exhibited: Hemosiderin Staining. The periwound skin appearance did not exhibit: Callus, Crepitus, Excoriation, Fluctuance, Friable, Induration, Localized Edema, Rash, Scarring, Dry/Scaly, Maceration, Moist, Atrophie Blanche, Cyanosis, Ecchymosis, Mottled, Pallor, Rubor, Erythema. Periwound temperature was noted as No Abnormality. The periwound has tenderness on palpation. [name], [name] ([id number]) All of her wounds have significantly improved especially on the left leg where both the left lateral calcaneum and the left Achilles tendon is almost completely granulated.The right heel has some debris and this will need continuing treatment with Santyl ointment. 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 We'll continue to use Santyl over this. On her left leg we will use Prisma AG. She will continue with hyperbaric oxygen therapy which seems to be helping VERY MUCH. I will see her back for a wound care visit next week. Plan Wound Cleansing: Wound #1 Left,Lateral Calcaneous: Clean wound with Normal  Saline. - be sure to clean out all remaining prisma Wound #5 Right Calcaneous: Clean wound with Normal Saline. - be sure to clean out all remaining prisma Wound #6 Left Achilles: Clean wound with Normal Saline. - be sure to clean out all remaining prisma Anesthetic: Wound #1 Left,Lateral Calcaneous: Topical Lidocaine 4% cream applied to wound bed prior to debridement Hurricaine Topical Anesthetic Spray applied to wound bed prior to debridement Wound #5 Right Calcaneous: Topical Lidocaine 4% cream applied to wound bed prior to debridement Hurricaine Topical Anesthetic Spray applied to wound bed prior to debridement Wound #6 Left Achilles: Topical Lidocaine 4% cream applied to wound bed prior to debridement Hurricaine Topical Anesthetic Spray applied to wound bed prior to debridement [place], Odette ([id number]) Primary Wound Dressing: Wound #1 Left,Lateral Calcaneous: Prisma Ag Wound #5 Right Calcaneous: Santyl Ointment Wound #6 Left Achilles: Prisma Ag Secondary Dressing: Wound #1 Left,Lateral Calcaneous: ABD and Kerlix/Conform Wound #5 Right Calcaneous: ABD and Kerlix/Conform Wound #6 Left Achilles: ABD and Kerlix/Conform Dressing Change Frequency: Wound #1 Left,Lateral Calcaneous: Change dressing every day. Wound #5 Right Calcaneous: Change dressing every day. Wound #6 Left Achilles: Change dressing every day. Follow-up Appointments: Wound #1 Left,Lateral Calcaneous: Return Appointment in 1 week. Wound #5 Right Calcaneous: Return Appointment in 1  week. Wound #6 Left Achilles: Return Appointment in 1 week. Off-Loading: Wound #1 Left,Lateral Calcaneous: Other: - SAGE boots while sitting or lying. Do not wear while ambulating. Wound #5 Right Calcaneous: Other: - SAGE boots while sitting or lying. Do not wear while ambulating. Wound #6 Left Achilles: Other: - SAGE boots while sitting or lying. Do not wear while ambulating. Home Health: Wound #1 Left,Lateral Calcaneous: Continue Home Health Visits - [facility] Nurse may visit PRN to address patient s wound care needs. FACE TO FACE ENCOUNTER: MEDICARE and MEDICAID PATIENTS: I certify that this patient is under my care and that I had a face-to-face encounter that meets the physician face-to-face encounter requirements with this patient on this date. The encounter with the patient was in whole or in part for the following MEDICAL CONDITION: (primary reason for [place]) MEDICAL NECESSITY: I certify, that based on my findings, NURSING services are a medically necessary home health service. HOME BOUND STATUS: I certify that my clinical findings support that this patient is homebound (i.e., Due to illness or injury, pt requires aid of supportive devices such as crutches, cane, wheelchairs, walkers, the use of special transportation or the assistance of another person to leave their place of residence. There is a normal inability to leave the home and doing so requires considerable and taxing effort. Other absences are for medical reasons / religious services and are infrequent or of short duration when for other reasons). If current dressing causes regression in wound condition, may D/C ordered dressing product/s and apply Normal Saline Moist Dressing daily until next [place] / Other MD appointment. Notify Wound [name], [name] ([id number]) [place] of regression in wound condition at [phone]. Please direct any NON-WOUND related issues/requests for  orders to patient's Primary Care Physician Wound #5 Right Calcaneous: [place] Visits - [facility] Nurse may visit PRN to address patient s wound care needs. FACE TO FACE ENCOUNTER: MEDICARE and MEDICAID PATIENTS: I certify that this patient is under my care and that I had a face-to-face encounter that meets the physician face-to-face encounter requirements with this patient on this date. The encounter with the patient was in whole or in part for the following  MEDICAL CONDITION: (primary reason for Home Healthcare) MEDICAL NECESSITY: I certify, that based on my findings, NURSING services are a medically necessary home health service. HOME BOUND STATUS: I certify that my clinical findings support that this patient is homebound (i.e., Due to illness or injury, pt requires aid of supportive devices such as crutches, cane, wheelchairs, walkers, the use of special transportation or the assistance of another person to leave their place of residence. There is a normal inability to leave the home and doing so requires considerable and taxing effort. Other absences are for medical reasons / religious services and are infrequent or of short duration when for other reasons). If current dressing causes regression in wound condition, may D/C ordered dressing product/s and apply Normal Saline Moist Dressing daily until next [place] / Other MD appointment. [place] of regression in wound condition at [phone]. Please direct any NON-WOUND related issues/requests for orders to patient's Primary Care Physician Wound #6 Left Achilles: [place] Visits - [facility] Nurse may visit PRN to address patient s wound care needs. FACE TO FACE ENCOUNTER: MEDICARE and MEDICAID PATIENTS: I certify that this patient is under my care and that I had a face-to-face encounter that meets the physician face-to-face encounter requirements with this  patient on this date. The encounter with the patient was in whole or in part for the following MEDICAL CONDITION: (primary reason for [place]) MEDICAL NECESSITY: I certify, that based on my findings, NURSING services are a medically necessary home health service. HOME BOUND STATUS: I certify that my clinical findings support that this patient is homebound (i.e., Due to illness or injury, pt requires aid of supportive devices such as crutches, cane, wheelchairs, walkers, the use of special transportation or the assistance of another person to leave their place of residence. There is a normal inability to leave the home and doing so requires considerable and taxing effort. Other absences are for medical reasons / religious services and are infrequent or of short duration when for other reasons). If current dressing causes regression in wound condition, may D/C ordered dressing product/s and apply Normal Saline Moist Dressing daily until next [place] / Other MD appointment. [place] of regression in wound condition at [phone]. Please direct any NON-WOUND related issues/requests for orders to patient's Primary Care Physician Hyperbaric Oxygen Therapy: Wound #1 Left,Lateral Calcaneous: Indication: - acute arterial insufficiency If appropriate for treatment, begin HBOT per protocol: 2.0 ATA for 90 Minutes without Air Breaks One treatment per day (delivered Monday through Friday unless otherwise specified in Special Instructions below): Total # of Treatments: - 30 Wound #5 Right Calcaneous: Indication: - acute arterial insufficiency If appropriate for treatment, begin HBOT per protocol: 2.0 ATA for 90 Minutes without Air Breaks One treatment per day (delivered Monday through Friday unless otherwise specified in Special Instructions below): Total # of Treatments: - 30 [name], [name] ([id number]) Wound #6 Left Achilles: Indication: - acute arterial  insufficiency If appropriate for treatment, begin HBOT per protocol: 2.0 ATA for 90 Minutes without Air Breaks One treatment per day (delivered Monday through Friday unless otherwise specified in Special Instructions below): Total # of Treatments: - 30 HBO Contraindications: Wound #1 Left,Lateral Calcaneous: HBO contraindications of hyperbaric oxygen therapy were reviewed and the patient found to have no untreated pneumothorax or history of spontaneous pneumothorax. HBO contraindications of hyperbaric oxygen therapy were reviewed and the patient found to have no history of medications such as Bleomycin, Adriamycin,  disulfiram, cisplatin and sulfamylon and is not currently receiving any chemotherapy. HBO contraindications of hyperbaric oxygen therapy were reviewed and the patient found to have no Upper respiratory infection and chronic sinusitis. HBO contraindications of hyperbaric oxygen therapy were reviewed and the patient found to have no history of retinal surgery proceeding 6 weeks or intraocular gas HBO contraindications of hyperbaric oxygen therapy were reviewed and the patient found to have no history seizure disorder or any anticonvulsant medication. HBO contraindications of hyperbaric oxygen therapy were reviewed and the patient found to have no septicemia with CO2 retention. HBO contraindications of hyperbaric oxygen therapy were reviewed and the patient found to have no fever greater than 100 degrees. HBO contraindications of hyperbaric oxygen therapy were reviewed and the patient found to have no pregnancy noted. HBO contraindications of hyperbaric oxygen therapy were reviewed and the patient found to have no medications such as steroids or narcotics or Phenergan. Wound #5 Right Calcaneous: HBO contraindications of hyperbaric oxygen therapy were reviewed and the patient found to have no untreated pneumothorax or history of spontaneous pneumothorax. HBO contraindications of  hyperbaric oxygen therapy were reviewed and the patient found to have no history of medications such as Bleomycin, Adriamycin, disulfiram, cisplatin and sulfamylon and is not currently receiving any chemotherapy. HBO contraindications of hyperbaric oxygen therapy were reviewed and the patient found to have no Upper respiratory infection and chronic sinusitis. HBO contraindications of hyperbaric oxygen therapy were reviewed and the patient found to have no history of retinal surgery proceeding 6 weeks or intraocular gas HBO contraindications of hyperbaric oxygen therapy were reviewed and the patient found to have no history seizure disorder or any anticonvulsant medication. HBO contraindications of hyperbaric oxygen therapy were reviewed and the patient found to have no septicemia with CO2 retention. HBO contraindications of hyperbaric oxygen therapy were reviewed and the patient found to have no fever greater than 100 degrees. HBO contraindications of hyperbaric oxygen therapy were reviewed and the patient found to have no pregnancy noted. HBO contraindications of hyperbaric oxygen therapy were reviewed and the patient found to have no medications such as steroids or narcotics or Phenergan. Wound #6 Left Achilles: HBO contraindications of hyperbaric oxygen therapy were reviewed and the patient found to have no untreated pneumothorax or history of spontaneous pneumothorax. [name], [name] ([id number]) HBO contraindications of hyperbaric oxygen therapy were reviewed and the patient found to have no history of medications such as Bleomycin, Adriamycin, disulfiram, cisplatin and sulfamylon and is not currently receiving any chemotherapy. HBO contraindications of hyperbaric oxygen therapy were reviewed and the patient found to have no Upper respiratory infection and chronic sinusitis. HBO contraindications of hyperbaric oxygen therapy were reviewed and the patient found to have no history of  retinal surgery proceeding 6 weeks or intraocular gas HBO contraindications of hyperbaric oxygen therapy were reviewed and the patient found to have no history seizure disorder or any anticonvulsant medication. HBO contraindications of hyperbaric oxygen therapy were reviewed and the patient found to have no septicemia with CO2 retention. HBO contraindications of hyperbaric oxygen therapy were reviewed and the patient found to have no fever greater than 100 degrees. HBO contraindications of hyperbaric oxygen therapy were reviewed and the patient found to have no pregnancy noted. HBO contraindications of hyperbaric oxygen therapy were reviewed and the patient found to have no medications such as steroids or narcotics or Phenergan. Medications-please add to medication list.: Wound #5 Right Calcaneous: Santyl Enzymatic Ointment She has a wedding to go to this weekend and did not want  me to debride her right calcaneal wound. We'll continue to use Santyl over this. On her left leg we will use Prisma AG. She will continue with hyperbaric oxygen therapy which seems to be helping VERY MUCH. I will see her back for a wound care visit next week. Electronic Signature(s) Signed: 12/28/2014 4:49:36 PM By: Christin Fudge MD, FACS Entered By: Christin Fudge on 12/28/2014 16:13:19 [name] ([id number]) -------------------------------------------------------------------------------- SuperBill Details Patient Name: [name] Date of Service: 12/28/2014 Medical Record Number: [id number] Patient Account Number: [id number] Date of Birth/Sex: [birth year] (81 y.o. Female) Treating RN: Primary Care Physician: Constance Goltz Other Clinician: Referring Physician: Constance Goltz Treating Physician/Extender: Frann Rider in Treatment: 15 Diagnosis Coding ICD-10 Codes Code Description [phone] Non-pressure chronic ulcer of left heel and midfoot with fat layer exposed I70.244 Atherosclerosis of native  arteries of left leg with ulceration of heel and midfoot I70.235 Atherosclerosis of native arteries of right leg with ulceration of other part of foot 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 Facility Procedures CPT4 Code: [id number] Description: 99214 - WOUND CARE VISIT-LEV 4 EST PT Modifier: Quantity: 1 Physician Procedures CPT4: Description Modifier Quantity Code [id number] - WC PHYS LEVEL 3 - EST PT 1 ICD-10 Description Diagnosis 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

## 2014-12-29 NOTE — Progress Notes (Signed)
TANAYIA, THEARD ([id number]) Visit Report for 12/28/2014 Arrival Information Details Patient Name: [name], [name] Date of Service: 12/28/2014 3:30 PM Medical Record Number: [id number] Patient Account Number: [id number] Date of Birth/Sex: 11-Dec-1927 (81 y.o. Female) Treating RN: Aileen Fass Primary Care Physician: Constance Goltz Other Clinician: Referring Physician: Constance Goltz Treating Physician/Extender: Frann Rider in Treatment: 15 Visit Information History Since Last Visit All ordered tests and consults were completed: No Patient Arrived: Wheel Chair Added or deleted any medications: No Arrival Time: 15:30 Any new allergies or adverse reactions: No Accompanied By: self Had a fall or experienced change in No Transfer Assistance: None activities of daily living that may affect Patient Identification Verified: Yes risk of falls: Secondary Verification Process Yes Signs or symptoms of abuse/neglect since last No Completed: visito Patient Requires Transmission- No Hospitalized since last visit: No Based Precautions: Has Dressing in Place as Prescribed: Yes Patient Has Alerts: Yes Pain Present Now: Yes Patient Alerts: Patient on Blood Thinner DMII Plavix ABI L 0.63 R 0.79 Electronic Signature(s) Signed: 12/28/2014 4:22:36 PM By: Aileen Fass Entered By: Aileen Fass on 12/28/2014 15:31:10 [name] ([id number]) -------------------------------------------------------------------------------- Clinic Level of Care Assessment Details Patient Name: [name] Date of Service: 12/28/2014 3:30 PM Medical Record Number: [id number] Patient Account Number: [id number] Date of Birth/Sex: 1928-04-01 (81 y.o. Female) Treating RN: Montey Hora Primary Care Physician: Constance Goltz Other Clinician: Referring Physician: Constance Goltz Treating Physician/Extender: Frann Rider in Treatment: 15 Clinic Level of Care Assessment Items TOOL 4 Quantity  Score []  - Use when only an EandM is performed on FOLLOW-UP visit 0 ASSESSMENTS - Nursing Assessment / Reassessment X - Reassessment of Co-morbidities (includes updates in patient status) 1 10 X - Reassessment of Adherence to Treatment Plan 1 5 ASSESSMENTS - Wound and Skin Assessment / Reassessment []  - Simple Wound Assessment / Reassessment - one wound 0 X - Complex Wound Assessment / Reassessment - multiple wounds 3 5 []  - Dermatologic / Skin Assessment (not related to wound area) 0 ASSESSMENTS - Focused Assessment X - Circumferential Edema Measurements - multi extremities 1 5 []  - Nutritional Assessment / Counseling / Intervention 0 X - Lower Extremity Assessment (monofilament, tuning fork, pulses) 1 5 []  - Peripheral Arterial Disease Assessment (using hand held doppler) 0 ASSESSMENTS - Ostomy and/or Continence Assessment and Care []  - Incontinence Assessment and Management 0 []  - Ostomy Care Assessment and Management (repouching, etc.) 0 PROCESS - Coordination of Care X - Simple Patient / Family Education for ongoing care 1 15 []  - Complex (extensive) Patient / Family Education for ongoing care 0 []  - Staff obtains Programmer, systems, Records, Test Results / Process Orders 0 []  - Staff telephones HHA, Nursing Homes / Clarify orders / etc 0 []  - Routine Transfer to another Facility (non-emergent condition) 0 [facility], [place] ([id number]) []  - Routine Hospital Admission (non-emergent condition) 0 []  - New Admissions / Biomedical engineer / Ordering NPWT, Apligraf, etc. 0 []  - Emergency Hospital Admission (emergent condition) 0 X - Simple Discharge Coordination 1 10 []  - Complex (extensive) Discharge Coordination 0 PROCESS - Special Needs []  - Pediatric / Minor Patient Management 0 []  - Isolation Patient Management 0 []  - Hearing / Language / Visual special needs 0 []  - Assessment of Community assistance (transportation, D/C planning, etc.) 0 []  - Additional assistance / Altered mentation  0 []  - Support Surface(s) Assessment (bed, cushion, seat, etc.) 0 INTERVENTIONS - Wound Cleansing / Measurement []  - Simple Wound Cleansing - one wound 0 X - Complex Wound Cleansing -  multiple wounds 3 5 X - Wound Imaging (photographs - any number of wounds) 1 5 []  - Wound Tracing (instead of photographs) 0 []  - Simple Wound Measurement - one wound 0 X - Complex Wound Measurement - multiple wounds 3 5 INTERVENTIONS - Wound Dressings X - Small Wound Dressing one or multiple wounds 3 10 []  - Medium Wound Dressing one or multiple wounds 0 []  - Large Wound Dressing one or multiple wounds 0 []  - Application of Medications - topical 0 []  - Application of Medications - injection 0 INTERVENTIONS - Miscellaneous []  - External ear exam 0 CINTIA, ALLSTON ([id number]) []  - Specimen Collection (cultures, biopsies, blood, body fluids, etc.) 0 []  - Specimen(s) / Culture(s) sent or taken to Lab for analysis 0 []  - Patient Transfer (multiple staff / Harrel Lemon Lift / Similar devices) 0 []  - Simple Staple / Suture removal (25 or less) 0 []  - Complex Staple / Suture removal (26 or more) 0 []  - Hypo / Hyperglycemic Management (close monitor of Blood Glucose) 0 []  - Ankle / Brachial Index (ABI) - do not check if billed separately 0 X - Vital Signs 1 5 Has the patient been seen at the hospital within the last three years: Yes Total Score: 135 Level Of Care: New/Established - Level 4 Electronic Signature(s) Signed: 12/28/2014 4:17:51 PM By: Montey Hora Entered By: Montey Hora on 12/28/2014 16:17:50 [name] ([id number]) -------------------------------------------------------------------------------- Encounter Discharge Information Details Patient Name: [name] Date of Service: 12/28/2014 3:30 PM Medical Record Number: [id number] Patient Account Number: [id number] Date of Birth/Sex: [birth year] (81 y.o. Female) Treating RN: Primary Care Physician: Constance Goltz Other Clinician: Referring  Physician: Constance Goltz Treating Physician/Extender: Frann Rider in Treatment: 15 Encounter Discharge Information Items Discharge Pain Level: 5 Discharge Condition: Stable Ambulatory Status: Wheelchair Discharge Destination: Home Transportation: Private Auto Accompanied By: driver Schedule Follow-up Appointment: Yes Medication Reconciliation completed and provided to Patient/Care No Jodee Wagenaar: Provided on Clinical Summary of Care: 12/28/2014 Form Type Recipient Paper Patient DJ Electronic Signature(s) Signed: 12/28/2014 4:22:36 PM By: Aileen Fass Previous Signature: 12/28/2014 3:59:58 PM Version By: Ruthine Dose Entered By: Aileen Fass on 12/28/2014 16:01:12 [name] ([id number]) -------------------------------------------------------------------------------- Lower Extremity Assessment Details Patient Name: [name] Date of Service: 12/28/2014 3:30 PM Medical Record Number: [id number] Patient Account Number: [id number] Date of Birth/Sex: 20-Aug-1927 (81 y.o. Female) Treating RN: Aileen Fass Primary Care Physician: Constance Goltz Other Clinician: Referring Physician: Constance Goltz Treating Physician/Extender: Frann Rider in Treatment: 15 Edema Assessment Assessed: [Left: No] [Right: No] Edema: [Left: Yes] [Right: Yes] Calf Left: Right: Point of Measurement: 33 cm From Medial Instep 39.5 cm 40 cm Ankle Left: Right: Point of Measurement: 11 cm From Medial Instep 22.8 cm 23 cm Vascular Assessment Pulses: Posterior Tibial Palpable: [Left:No] [Right:No] Doppler: [Left:Monophasic] [Right:Monophasic] Dorsalis Pedis Palpable: [Left:No] [Right:No] Extremity colors, hair growth, and conditions: Extremity Color: [Left:Hyperpigmented] [Right:Hyperpigmented] Hair Growth on Extremity: [Left:No] [Right:No] Temperature of Extremity: [Left:Cool] [Right:Cool] Capillary Refill: [Left:> 3 seconds] [Right:> 3 seconds] Toe Nail Assessment Left:  Right: Thick: No No Discolored: No No Deformed: No No Improper Length and Hygiene: No No Electronic Signature(s) Signed: 12/28/2014 4:22:36 PM By: Aileen Fass Entered By: Aileen Fass on 12/28/2014 15:45:31 [place], [place] ([id number]) [place], Preet ([id number]) -------------------------------------------------------------------------------- Multi Wound Chart Details Patient Name: [name] Date of Service: 12/28/2014 3:30 PM Medical Record Number: [id number] Patient Account Number: [id number] Date of Birth/Sex: 1927-08-21 (81 y.o. Female) Treating RN: Montey Hora Primary Care Physician: Constance Goltz Other Clinician: Referring Physician: Constance Goltz Treating Physician/Extender:  Britto, Errol Weeks in Treatment: 15 Vital Signs Height(in): 64 Pulse(bpm): 78 Weight(lbs): 180 Blood Pressure 100/74 (mmHg): Body Mass Index(BMI): 31 Temperature(F): 98.1 Respiratory Rate 18 (breaths/min): Photos: [1:No Photos] [5:No Photos] [6:No Photos] Wound Location: [1:Left Calcaneous - Lateral] [5:Right Calcaneous] [6:Left Achilles] Wounding Event: [1:Pressure Injury] [5:Gradually Appeared] [6:Gradually Appeared] Primary Etiology: [1:Diabetic Wound/Ulcer of the Lower Extremity] [5:Diabetic Wound/Ulcer of the Lower Extremity] [6:Diabetic Wound/Ulcer of the Lower Extremity] Secondary Etiology: [1:Pressure Ulcer] [5:N/A] [6:N/A] Comorbid History: [1:Glaucoma, Asthma, Hypertension, Peripheral Venous Disease, Type II Diabetes] [5:Glaucoma, Asthma, Hypertension, Peripheral Venous Disease, Type II Diabetes] [6:Glaucoma, Asthma, Hypertension, Peripheral Venous Disease, Type II  Diabetes] Date Acquired: [1:07/24/2014] [5:09/26/2014] [6:09/26/2014] Weeks of Treatment: [1:15] [5:12] [6:12] Wound Status: [1:Open] [5:Open] [6:Open] Measurements L x W x D 1x1x0.5 [5:1.1x0.8x0.2] [6:0.3x0.2x0.1] (cm) Area (cm) : [1:0.785] [5:0.691] [6:0.047] Volume (cm) : [1:0.393] [5:0.138] [6:0.005] %  Reduction in Area: [1:84.60%] [5:87.40%] [6:98.80%] % Reduction in Volume: 80.80% [5:74.90%] [6:99.70%] Classification: [1:Grade 1] [5:Grade 2] [6:Grade 1] Exudate Amount: [1:Small] [5:Small] [6:Small] Exudate Type: [1:Serous] [5:Serosanguineous] [6:Serous] Exudate Color: [1:amber] [5:red, brown] [6:amber] Wound Margin: [1:Distinct, outline attached] [5:Distinct, outline attached] [6:Distinct, outline attached] Granulation Amount: [1:Medium (34-66%)] [5:Medium (34-66%)] [6:Large (67-100%)] Granulation Quality: [1:Pink] [5:Pink] [6:Pink] Necrotic Amount: [1:Small (1-33%)] [5:Medium (34-66%)] [6:Small (1-33%)] Exposed Structures: [1:Fascia: No Fat: No Tendon: No] [5:Fascia: No Fat: No Tendon: No] [6:Fascia: No Fat: No Tendon: No] Muscle: No Muscle: No Muscle: No Joint: No Joint: No Joint: No Bone: No Bone: No Bone: No Limited to Skin Limited to Skin Limited to Skin Breakdown Breakdown Breakdown Epithelialization: Medium (34-66%) Medium (34-66%) Medium (34-66%) Periwound Skin Texture: Edema: No Edema: No Edema: No Excoriation: No Excoriation: No Excoriation: No Induration: No Induration: No Induration: No Callus: No Callus: No Callus: No Crepitus: No Crepitus: No Crepitus: No Fluctuance: No Fluctuance: No Fluctuance: No Friable: No Friable: No Friable: No Rash: No Rash: No Rash: No Scarring: No Scarring: No Scarring: No Periwound Skin Maceration: No Maceration: No Maceration: No Moisture: Moist: No Moist: No Moist: No Dry/Scaly: No Dry/Scaly: No Dry/Scaly: No Periwound Skin Color: Atrophie Blanche: No Hemosiderin Staining: Yes Hemosiderin Staining: Yes Cyanosis: No Atrophie Blanche: No Atrophie Blanche: No Ecchymosis: No Cyanosis: No Cyanosis: No Erythema: No Ecchymosis: No Ecchymosis: No Hemosiderin Staining: No Erythema: No Erythema: No Mottled: No Mottled: No Mottled: No Pallor: No Pallor: No Pallor: No Rubor: No Rubor: No Rubor:  No Temperature: No Abnormality No Abnormality No Abnormality Tenderness on Yes Yes Yes Palpation: Wound Preparation: Ulcer Cleansing: Ulcer Cleansing: Ulcer Cleansing: Rinsed/Irrigated with Rinsed/Irrigated with Rinsed/Irrigated with Saline Saline Saline Topical Anesthetic Topical Anesthetic Topical Anesthetic Applied: Other: Lidocaine Applied: Other: Lidocaine Applied: Other: lidocaine 4% Cream 4% Ointment 4% Treatment Notes Electronic Signature(s) Signed: 12/28/2014 5:34:11 PM By: Montey Hora Entered By: Montey Hora on 12/28/2014 15:52:37 [name] ([id number]) -------------------------------------------------------------------------------- Multi-Disciplinary Care Plan Details Patient Name: [name] Date of Service: 12/28/2014 3:30 PM Medical Record Number: [id number] Patient Account Number: [id number] Date of Birth/Sex: 02/20/1928 (81 y.o. Female) Treating RN: Montey Hora Primary Care Physician: Constance Goltz Other Clinician: Referring Physician: Constance Goltz Treating Physician/Extender: Frann Rider in Treatment: 15 Active Inactive Abuse / Safety / Falls / Self Care Management Nursing Diagnoses: Impaired physical mobility Potential for falls Goals: Patient will remain injury free Date Initiated: 09/12/2014 Goal Status: Active Patient/caregiver will verbalize understanding of skin care regimen Date Initiated: 09/12/2014 Goal Status: Active Patient/caregiver will verbalize/demonstrate measures taken to prevent injury and/or falls Date Initiated: 09/12/2014 Goal Status: Active Patient/caregiver will verbalize/demonstrate understanding of  what to do in case of emergency Date Initiated: 09/12/2014 Goal Status: Active Interventions: Assess fall risk on admission and as needed Assess: immobility, friction, shearing, incontinence upon admission and as needed Assess impairment of mobility on admission and as needed per policy Provide education on  basic hygiene Provide education on fall prevention Provide education on personal and home safety Provide education on safe transfers Treatment Activities: Education provided on Basic Hygiene : 10/12/2014 Notes: Necrotic Tissue KATARYNA, KIN ([id number]) Nursing Diagnoses: Impaired tissue integrity related to necrotic/devitalized tissue Knowledge deficit related to management of necrotic/devitalized tissue Goals: Necrotic/devitalized tissue will be minimized in the wound bed Date Initiated: 09/12/2014 Goal Status: Active Patient/caregiver will verbalize understanding of reason and process for debridement of necrotic tissue Date Initiated: 09/12/2014 Goal Status: Active Interventions: Assess patient pain level pre-, during and post procedure and prior to discharge Provide education on necrotic tissue and debridement process Treatment Activities: Apply topical anesthetic as ordered : 12/28/2014 Enzymatic debridement : 12/28/2014 Excisional debridement : 12/28/2014 Notes: Orientation to the Wound Care Program Nursing Diagnoses: Knowledge deficit related to the wound healing center program Goals: Patient/caregiver will verbalize understanding of the [place] Program Date Initiated: 09/12/2014 Goal Status: Active Interventions: Provide education on orientation to the wound center Notes: Pressure Nursing Diagnoses: Knowledge deficit related to causes and risk factors for pressure ulcer development Knowledge deficit related to management of pressures ulcers Potential for impaired tissue integrity related to pressure, friction, moisture, and shear GoalsKHRYSTAL, THORPE ([id number]) Patient will remain free from development of additional pressure ulcers Date Initiated: 09/12/2014 Goal Status: Active Patient will remain free of pressure ulcers Date Initiated: 09/12/2014 Goal Status: Active Patient/caregiver will verbalize risk factors for pressure ulcer development Date  Initiated: 09/12/2014 Goal Status: Active Patient/caregiver will verbalize understanding of pressure ulcer management Date Initiated: 09/12/2014 Goal Status: Active Interventions: Assess: immobility, friction, shearing, incontinence upon admission and as needed Assess offloading mechanisms upon admission and as needed Assess potential for pressure ulcer upon admission and as needed Provide education on pressure ulcers Treatment Activities: Pressure reduction/relief device ordered : 12/28/2014 Notes: Wound/Skin Impairment Nursing Diagnoses: Impaired tissue integrity Knowledge deficit related to ulceration/compromised skin integrity Goals: Patient/caregiver will verbalize understanding of skin care regimen Date Initiated: 09/12/2014 Goal Status: Active Ulcer/skin breakdown will heal within 14 weeks Date Initiated: 09/12/2014 Goal Status: Active Interventions: Assess patient/caregiver ability to obtain necessary supplies Assess patient/caregiver ability to perform ulcer/skin care regimen upon admission and as needed Assess ulceration(s) every visit Provide education on ulcer and skin care Treatment Activities: Skin care regimen initiated : 12/28/2014 FLAVIA, MCCARVER ([id number]) Topical wound management initiated : 12/28/2014 Notes: Electronic Signature(s) Signed: 12/28/2014 5:34:11 PM By: Montey Hora Entered By: Montey Hora on 12/28/2014 15:52:23 [name] ([id number]) -------------------------------------------------------------------------------- Pain Assessment Details Patient Name: [name] Date of Service: 12/28/2014 3:30 PM Medical Record Number: [id number] Patient Account Number: [id number] Date of Birth/Sex: [birth year] (81 y.o. Female) Treating RN: Aileen Fass Primary Care Physician: Constance Goltz Other Clinician: Referring Physician: Constance Goltz Treating Physician/Extender: Frann Rider in Treatment: 15 Active Problems Location of Pain  Severity and Description of Pain Patient Has Paino Yes Site Locations Pain Location: Pain in Ulcers With Dressing Change: Yes Duration of the Pain. Constant / Intermittento Intermittent Rate the pain. Current Pain Level: 5 Worst Pain Level: 10 Least Pain Level: 0 Tolerable Pain Level: 5 Character of Pain Describe the Pain: Sharp Pain Management and Medication Current Pain Management: Medication: Yes Cold Application: No Rest: No Massage: No Activity: No T.E.N.S.:  No Heat Application: No Leg drop or elevation: No Is the Current Pain Management Inadequate Adequate: How does your pain impact your activities of daily livingo Sleep: No Bathing: No Appetite: No Relationship With Others: No Bladder Continence: No Emotions: No Bowel Continence: No Work: No Toileting: No Drive: No Dressing: No Hobbies: No Electronic SAYYORA, KORVER ([id number]) Signed: 12/28/2014 4:22:36 PM By: Aileen Fass Entered By: Aileen Fass on 12/28/2014 [place], [place] ([id number]) -------------------------------------------------------------------------------- Patient/Caregiver Education Details Patient Name: [name] Date of Service: 12/28/2014 3:30 PM Medical Record Number: [id number] Patient Account Number: [id number] Date of Birth/Gender: Aug 27, 1927 (82 y.o. Female) Treating RN: Aileen Fass Primary Care Physician: Constance Goltz Other Clinician: Referring Physician: Constance Goltz Treating Physician/Extender: Frann Rider in Treatment: 15 Education Assessment Education Provided To: Patient Education Topics Provided Offloading: Handouts: What is Offloadingo Methods: Explain/Verbal Responses: State content correctly Electronic Signature(s) Signed: 12/28/2014 4:22:36 PM By: Aileen Fass Entered By: Aileen Fass on 12/28/2014 [place], [place]  ([id number]) -------------------------------------------------------------------------------- Wound Assessment Details Patient Name: [name] Date of Service: 12/28/2014 3:30 PM Medical Record Number: [id number] Patient Account Number: [id number] Date of Birth/Sex: [birth year] (81 y.o. Female) Treating RN: Aileen Fass Primary Care Physician: Constance Goltz Other Clinician: Referring Physician: Constance Goltz Treating Physician/Extender: Frann Rider in Treatment: 15 Wound Status Wound Number: 1 Primary Diabetic Wound/Ulcer of the Lower Etiology: Extremity Wound Location: Left Calcaneous - Lateral Secondary Pressure Ulcer Wounding Event: Pressure Injury Etiology: Date Acquired: 07/24/2014 Wound Open Weeks Of Treatment: 15 Status: Clustered Wound: No Comorbid Glaucoma, Asthma, Hypertension, History: Peripheral Venous Disease, Type II Diabetes Wound Measurements Length: (cm) 1 Width: (cm) 1 Depth: (cm) 0.5 Area: (cm) 0.785 Volume: (cm) 0.393 % Reduction in Area: 84.6% % Reduction in Volume: 80.8% Epithelialization: Medium (34-66%) Tunneling: No Undermining: No Wound Description Classification: Grade 1 Wound Margin: Distinct, outline attached Exudate Amount: Small Exudate Type: Serous Exudate Color: amber Foul Odor After Cleansing: No Wound Bed Granulation Amount: Medium (34-66%) Exposed Structure Granulation Quality: Pink Fascia Exposed: No Necrotic Amount: Small (1-33%) Fat Layer Exposed: No Necrotic Quality: Adherent Slough Tendon Exposed: No Muscle Exposed: No Joint Exposed: No Bone Exposed: No Limited to Skin Breakdown Periwound Skin Texture Texture Color No Abnormalities Noted: No No Abnormalities Noted: No Callus: No Atrophie BlancheAlmitra Mere, Leilana ([id number]) Crepitus: No Cyanosis: No Excoriation: No Ecchymosis: No Fluctuance: No Erythema: No Friable: No Hemosiderin Staining: No Induration: No Mottled: No Localized  Edema: No Pallor: No Rash: No Rubor: No Scarring: No Temperature / Pain Moisture Temperature: No Abnormality No Abnormalities Noted: No Tenderness on Palpation: Yes Dry / Scaly: No Maceration: No Moist: No Wound Preparation Ulcer Cleansing: Rinsed/Irrigated with Saline Topical Anesthetic Applied: Other: Lidocaine 4% Cream, Treatment Notes Wound #1 (Left, Lateral Calcaneous) 1. Cleansed with: Clean wound with Normal Saline 2. Anesthetic Topical Lidocaine 4% cream to wound bed prior to debridement 4. Dressing Applied: Prisma Ag 5. Secondary [place], ABD and kerlix/Conform 7. Secured with Microbiologist) Signed: 12/28/2014 4:22:36 PM By: Aileen Fass Entered By: Aileen Fass on 12/28/2014 15:47:26 [name] ([id number]) -------------------------------------------------------------------------------- Wound Assessment Details Patient Name: [name] Date of Service: 12/28/2014 3:30 PM Medical Record Number: [id number] Patient Account Number: [id number] Date of Birth/Sex: 14-Oct-1927 (81 y.o. Female) Treating RN: Aileen Fass Primary Care Physician: Constance Goltz Other Clinician: Referring Physician: Constance Goltz Treating Physician/Extender: Frann Rider in Treatment: 15 Wound Status Wound Number: 5 Primary Diabetic Wound/Ulcer of the Lower Etiology: Extremity Wound Location: Right Calcaneous Wound Open Wounding  Event: Gradually Appeared Status: Date Acquired: 09/26/2014 Comorbid Glaucoma, Asthma, Hypertension, Weeks Of Treatment: 12 History: Peripheral Venous Disease, Type II Clustered Wound: No Diabetes Wound Measurements Length: (cm) 1.1 Width: (cm) 0.8 Depth: (cm) 0.2 Area: (cm) 0.691 Volume: (cm) 0.138 % Reduction in Area: 87.4% % Reduction in Volume: 74.9% Epithelialization: Medium (34-66%) Tunneling: No Undermining: No Wound Description Classification: Grade 2 Wound Margin:  Distinct, outline attached Exudate Amount: Small Exudate Type: Serosanguineous Exudate Color: red, brown Foul Odor After Cleansing: No Wound Bed Granulation Amount: Medium (34-66%) Exposed Structure Granulation Quality: Pink Fascia Exposed: No Necrotic Amount: Medium (34-66%) Fat Layer Exposed: No Necrotic Quality: Adherent Slough Tendon Exposed: No Muscle Exposed: No Joint Exposed: No Bone Exposed: No Limited to Skin Breakdown Periwound Skin Texture Texture Color No Abnormalities Noted: No No Abnormalities Noted: No Callus: No Atrophie Blanche: No Crepitus: No Cyanosis: No Excoriation: No Ecchymosis: No DARCELLA, WEYLAND ([id number]) Fluctuance: No Erythema: No Friable: No Hemosiderin Staining: Yes Induration: No Mottled: No Localized Edema: No Pallor: No Rash: No Rubor: No Scarring: No Temperature / Pain Moisture Temperature: No Abnormality No Abnormalities Noted: No Tenderness on Palpation: Yes Dry / Scaly: No Maceration: No Moist: No Wound Preparation Ulcer Cleansing: Rinsed/Irrigated with Saline Topical Anesthetic Applied: Other: Lidocaine 4% Ointment, Treatment Notes Wound #5 (Right Calcaneous) 1. Cleansed with: Clean wound with Normal Saline 2. Anesthetic Topical Lidocaine 4% cream to wound bed prior to debridement 4. Dressing Applied: Santyl Ointment 5. Secondary [place], ABD and kerlix/Conform Notes Sage boot at [facility] [employer]) Signed: 12/28/2014 4:22:36 PM By: Aileen Fass Entered By: Aileen Fass on 12/28/2014 15:48:07 [name] ([id number]) -------------------------------------------------------------------------------- Wound Assessment Details Patient Name: [name] Date of Service: 12/28/2014 3:30 PM Medical Record Number: [id number] Patient Account Number: [id number] Date of Birth/Sex: May 28, 1928 (81 y.o. Female) Treating RN: Aileen Fass Primary Care Physician: Constance Goltz Other Clinician: Referring Physician: Constance Goltz Treating Physician/Extender: Frann Rider in Treatment: 15 Wound Status Wound Number: 6 Primary Diabetic Wound/Ulcer of the Lower Etiology: Extremity Wound Location: Left Achilles Wound Open Wounding Event: Gradually Appeared Status: Date Acquired: 09/26/2014 Comorbid Glaucoma, Asthma, Hypertension, Weeks Of Treatment: 12 History: Peripheral Venous Disease, Type II Clustered Wound: No Diabetes Wound Measurements Length: (cm) 0.3 Width: (cm) 0.2 Depth: (cm) 0.1 Area: (cm) 0.047 Volume: (cm) 0.005 % Reduction in Area: 98.8% % Reduction in Volume: 99.7% Epithelialization: Medium (34-66%) Tunneling: No Undermining: No Wound Description Classification: Grade 1 Wound Margin: Distinct, outline attached Exudate Amount: Small Exudate Type: Serous Exudate Color: amber Foul Odor After Cleansing: No Wound Bed Granulation Amount: Large (67-100%) Exposed Structure Granulation Quality: Pink Fascia Exposed: No Necrotic Amount: Small (1-33%) Fat Layer Exposed: No Necrotic Quality: Adherent Slough Tendon Exposed: No Muscle Exposed: No Joint Exposed: No Bone Exposed: No Limited to Skin Breakdown Periwound Skin Texture Texture Color No Abnormalities Noted: No No Abnormalities Noted: No Callus: No Atrophie Blanche: No Crepitus: No Cyanosis: No Excoriation: No Ecchymosis: No TANIELLE, ROESKE ([id number]) Fluctuance: No Erythema: No Friable: No Hemosiderin Staining: Yes Induration: No Mottled: No Localized Edema: No Pallor: No Rash: No Rubor: No Scarring: No Temperature / Pain Moisture Temperature: No Abnormality No Abnormalities Noted: No Tenderness on Palpation: Yes Dry / Scaly: No Maceration: No Moist: No Wound Preparation Ulcer Cleansing: Rinsed/Irrigated with Saline Topical Anesthetic Applied: Other: lidocaine 4%, Treatment Notes Wound #6 (Left Achilles) 1. Cleansed with: Clean wound  with Normal Saline 2. Anesthetic Topical Lidocaine 4% cream to wound bed prior to debridement 4. Dressing Applied: Prisma  Ag 5. Secondary [place], ABD and kerlix/Conform 7. Secured with Microbiologist) Signed: 12/28/2014 4:22:36 PM By: Aileen Fass Entered By: Aileen Fass on 12/28/2014 15:48:38 [name] ([id number]) -------------------------------------------------------------------------------- [place] Details Patient Name: [name] Date of Service: 12/28/2014 3:30 PM Medical Record Number: [id number] Patient Account Number: [id number] Date of Birth/Sex: [birth year] (81 y.o. Female) Treating RN: Aileen Fass Primary Care Physician: Constance Goltz Other Clinician: Referring Physician: Constance Goltz Treating Physician/Extender: Frann Rider in Treatment: 15 Vital Signs Time Taken: 15:30 Temperature (F): 98.1 Height (in): 64 Pulse (bpm): 78 Weight (lbs): 180 Respiratory Rate (breaths/min): 18 Body Mass Index (BMI): 30.9 Blood Pressure (mmHg): 100/74 Reference Range: 80 - 120 mg / dl Electronic Signature(s) Signed: 12/28/2014 3:47:24 PM By: Lorine Bears RCP, RRT, CHT Entered By: Lorine Bears on 12/28/2014 15:44:45

## 2015-01-01 ENCOUNTER — Encounter: Payer: Medicaid Other | Admitting: Surgery

## 2015-01-01 DIAGNOSIS — L97529 Non-pressure chronic ulcer of other part of left foot with unspecified severity: Secondary | ICD-10-CM | POA: Diagnosis not present

## 2015-01-01 NOTE — Progress Notes (Signed)
[name], [name] ([id number]) Visit Report for 01/01/2015 HBO Details Patient Name: [name], [name] Date of Service: 01/01/2015 8:00 AM Medical Record Number: [id number] Patient Account Number: [id number] Date of Birth/Sex: 1927/11/24 (81 y.o. 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, Peripheral HBO Treatment Details Treatment Number: 25 Patient Type: Outpatient Chamber Type: Monoplace Chamber #: [id number] Treatment Protocol: 2.0 ATA with 90 minutes oxygen, and no air breaks Treatment Details Compression Rate Down: 1.5 psi / minute De-Compression Rate Up: 1.5 psi / minute Air breaks and breathing Compress Tx Pressure Decompress Decompress periods Begins Reached Begins Ends (leave unused spaces blank) Chamber Pressure 1 ATA 2.0 ATA - - - - - - 2.0 ATA 1 ATA Clock Time (24 hr) 08:10 08:22 - - - - - - 09:53 10:02 Treatment Length: 112 (minutes) Treatment Segments: 4 Capillary Blood Glucose Pre Capillary Blood Glucose (mg/dl): Post Capillary Blood Glucose (mg/dl): Vital Signs Capillary Blood Glucose Reference Range: 80 - 120 mg / dl HBO Diabetic Blood Glucose Intervention Range: <131 mg/dl or >249 mg/dl Time Vitals Blood Respiratory Capillary Blood Glucose Pulse Action Type: Pulse: Temperature: Taken: Pressure: Rate: Glucose (mg/dl): Meter #: Oximetry (%) Taken: Pre 07:50 124/82 90 18 98.8 Post 10:20 100/50 72 18 97.8 Treatment Response Well [place], Laquia ([id number]) Treatment Toleration: Treatment Treatment Completed without Adverse Event Completion Status: HBO Attestation I certify that  I supervised this HBO treatment in accordance with Medicare guidelines. A trained Yes emergency response team is readily available per hospital policies and procedures. Continue HBOT as ordered. Yes Electronic Signature(s) Signed: 01/01/2015 12:30:09 PM By: Christin Fudge MD, FACS Entered By: Christin Fudge on 01/01/2015 12:28:57 [name] ([id number]) -------------------------------------------------------------------------------- HBO Safety Checklist Details Patient Name: [name] Date of Service: 01/01/2015 8:00 AM Medical Record Number: [id number] Patient Account Number: [id number] Date of Birth/Sex: 1928-06-16 (81 y.o. Female) Treating RN: Primary Care Physician: Constance Goltz Other Clinician: Jacqulyn Bath Referring Physician: Constance Goltz Treating Physician/Extender: Frann Rider in Treatment: 15 HBO Safety Checklist Items Safety Checklist Consent Form Signed Patient voided / foley secured and emptied When did you last eato 07:00 am Last dose of injectable or oral agent n/a Ostomy pouch emptied and vented if applicable n/a All implantable devices assessed, documented and approved n/a Intravenous access site secured and place n/a Valuables secured Linens and cotton and cotton/polyester blend (less than 51% polyester) Personal oil-based products / skin lotions / body lotions removed Wigs or hairpieces removed Smoking or tobacco materials removed Books / newspapers / magazines / loose paper removed Cologne, aftershave, perfume and deodorant removed Jewelry removed (may wrap wedding band) Make-up removed Hair care products removed Battery operated devices (external) removed Heating patches and chemical warmers removed Titanium eyewear removed Nail polish cured greater than 10 hours Casting material cured greater than 10 hours n/a Hearing aids removed n/a Loose dentures or partials removed Prosthetics have been removed n/a Patient demonstrates  correct use of air break device (if applicable) Patient concerns have been addressed Patient grounding bracelet on and cord attached to chamber Specifics for Inpatients (complete in addition to above) Medication sheet sent with patient Intravenous medications needed or due during therapy sent with patient TRESSIE, NEIMEYER ([id number]) Drainage tubes (e.g. nasogastric tube or chest tube secured and vented) Endotracheal  or Tracheotomy tube secured Cuff deflated of air and inflated with saline Airway suctioned Electronic Signature(s) Signed: 01/01/2015 12:45:35 PM By: Lorine Bears RCP, RRT, CHT Entered By: Becky Sax, Amado Nash on 01/01/2015 08:28:53

## 2015-01-01 NOTE — Progress Notes (Signed)
[name], [name] ([id number]) Visit Report for 01/01/2015 Arrival Information Details Patient Name: [name], [name] Date of Service: 01/01/2015 8:00 AM Medical Record Number: [id number] Patient Account Number: [id number] Date of Birth/Sex: 09/14/1927 (81 y.o. Female) Treating RN: Primary Care Physician: Constance Goltz Other Clinician: Jacqulyn Bath Referring Physician: Constance Goltz Treating Physician/Extender: Frann Rider in Treatment: 15 Visit Information History Since Last Visit Added or deleted any medications: No Patient Arrived: Wheel Chair Any new allergies or adverse reactions: No Arrival Time: 07:50 Had a fall or experienced change in No Accompanied By: Delfino Lovett, activities of daily living that may affect transport risk of falls: Transfer Assistance: Manual Signs or symptoms of abuse/neglect since last No Patient Identification Verified: Yes visito Secondary Verification Process Yes Hospitalized since last visit: No Completed: Has Dressing in Place as Prescribed: Yes Patient Requires Transmission- No Pain Present Now: No Based Precautions: Patient Has Alerts: Yes Patient Alerts: Patient on Blood Thinner DMII Plavix ABI L 0.63 R 0.79 Electronic Signature(s) Signed: 01/01/2015 12:45:35 PM By: Lorine Bears RCP, RRT, CHT Entered By: Lorine Bears on 01/01/2015 08:27:22 [name] ([id number]) -------------------------------------------------------------------------------- Encounter Discharge Information Details Patient Name: [name] Date of Service: 01/01/2015 8:00 AM Medical Record Number: [id number] Patient Account Number: [id number] Date of Birth/Sex: [birth year] (81 y.o. Female) Treating RN: Primary Care Physician: Constance Goltz Other Clinician: Jacqulyn Bath Referring Physician: Constance Goltz Treating Physician/Extender: Frann Rider in Treatment: 15 Encounter Discharge Information Items Discharge Pain  Level: 0 Discharge Condition: Stable Ambulatory Status: Wheelchair Nursing Discharge Destination: Home Transportation: Other [name], Accompanied By: transport Schedule Follow-up Appointment: No Medication Reconciliation completed No and provided to Patient/Care Chayim Bialas: Clinical Summary of Care: Notes Patient has an HBO treatment scheduled on 01/02/15 at 08:00 am. Electronic Signature(s) Signed: 01/01/2015 12:45:35 PM By: Lorine Bears RCP, RRT, CHT Entered By: Lorine Bears on 01/01/2015 10:49:31 [place], Toshia ([id number]) -------------------------------------------------------------------------------- Vitals Details Patient Name: [name] Date of Service: 01/01/2015 8:00 AM Medical Record Number: [id number] Patient Account Number: [id number] Date of Birth/Sex: [birth year] (81 y.o. Female) Treating RN: Primary Care Physician: Constance Goltz Other Clinician: Jacqulyn Bath Referring Physician: Constance Goltz Treating Physician/Extender: Frann Rider in Treatment: 15 Vital Signs Time Taken: 07:50 Temperature (F): 98.8 Height (in): 64 Pulse (bpm): 90 Weight (lbs): 180 Respiratory Rate (breaths/min): 18 Body Mass Index (BMI): 30.9 Blood Pressure (mmHg): 124/82 Reference Range: 80 - 120 mg / dl Electronic Signature(s) Signed: 01/01/2015 12:45:35 PM By: Lorine Bears RCP, RRT, CHT Entered By: Lorine Bears on 01/01/2015 08:27:50

## 2015-01-02 ENCOUNTER — Encounter: Payer: Medicaid Other | Admitting: Surgery

## 2015-01-02 DIAGNOSIS — L97529 Non-pressure chronic ulcer of other part of left foot with unspecified severity: Secondary | ICD-10-CM | POA: Diagnosis not present

## 2015-01-02 NOTE — Progress Notes (Signed)
[name] ([id number]) Visit Report for 01/02/2015 Arrival Information Details Patient Name: [name], [name] Date of Service: 01/02/2015 8:00 AM Medical Record Number: [id number] Patient Account Number: [id number] Date of Birth/Sex: 1928/05/09 (81 y.o. Female) Treating RN: Primary Care Physician: [name] Other Clinician: Jacqulyn [name] Referring Physician: Constance [name] Treating Physician/Extender: [name] in Treatment: 45 Visit Information History Since Last Visit Added or deleted any medications: No Patient Arrived: Wheel Chair Any new allergies or adverse reactions: No Arrival Time: 07:50 Had a fall or experienced change in No Accompanied By: [name], activities of daily living that may affect transport risk of falls: Transfer Assistance: Manual Signs or symptoms of abuse/neglect since last No Patient Identification Verified: Yes visito Secondary Verification Process Yes Hospitalized since last visit: No Completed: Has Dressing in Place as Prescribed: Yes Patient Requires Transmission- No Pain Present Now: No Based Precautions: Patient Has Alerts: Yes Patient Alerts: Patient on Blood Thinner DMII Plavix ABI L 0.63 R 0.79 Electronic Signature(s) Signed: 01/02/2015 10:54:33 AM By: Lorine Bears RCP, RRT, CHT Entered By: Lorine Bears on 01/02/2015 08:24:38 [name] ([id number]) -------------------------------------------------------------------------------- Encounter Discharge Information Details Patient Name: [name] Date of Service: 01/02/2015 8:00 AM Medical Record Number: [id number] Patient Account Number: [id number] Date of Birth/Sex: 1928/08/15 (81 y.o. Female) Treating RN: Primary Care Physician: [name] Other Clinician: Jacqulyn [name] Referring Physician: Constance [name] Treating Physician/Extender: [name] in Treatment: 49 Encounter Discharge Information Items Discharge Pain  Level: 0 Discharge Condition: Stable Ambulatory Status: Wheelchair Nursing Discharge Destination: Home Transportation: Other [name], Accompanied By: transport Schedule Follow-up Appointment: No Medication Reconciliation completed No and provided to Patient/Care [name]: Clinical Summary of Care: Notes Patient has an HBO treatment scheduled on 01/03/15 at 08:00 am. Electronic Signature(s) Signed: 01/02/2015 10:54:33 AM By: Lorine Bears RCP, RRT, CHT Entered By: Lorine Bears on 01/02/2015 10:45:57 [name], [name] -------------------------------------------------------------------------------- Vitals Details Patient Name: [name] Date of Service: 01/02/2015 8:00 AM Medical Record Number: [id number] Patient Account Number: [id number] Date of Birth/Sex: 1928/07/28 (81 y.o. Female) Treating RN: Primary Care Physician: [name] Other Clinician: Jacqulyn [name] Referring Physician: Constance [name] Treating Physician/Extender: [name] in Treatment: 16 Vital Signs Time Taken: 07:53 Temperature (F): 98.5 Height (in): 64 Pulse (bpm): 66 Weight (lbs): 180 Respiratory Rate (breaths/min): 18 Body Mass Index (BMI): 30.9 Blood Pressure (mmHg): 102/62 Reference Range: 80 - 120 mg / dl Electronic Signature(s) Signed: 01/02/2015 1:04:50 PM By: Lorine Bears RCP, RRT, CHT Entered By: [name], Amado Nash on 01/02/2015 08:25:08

## 2015-01-02 NOTE — Progress Notes (Signed)
[name], [name] ([id number]) Visit Report for 01/02/2015 HBO Details Patient Name: [name], [name] Date of Service: 01/02/2015 8:00 AM Medical Record Number: [id number] Patient Account Number: [id number] Date of Birth/Sex: 1928/03/12 (81 y.o. 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, Peripheral HBO Treatment Details Treatment Number: 26 Patient Type: Outpatient Chamber Type: Monoplace Chamber #: [id number] Treatment Protocol: 2.0 ATA with 90 minutes oxygen, and no air breaks Treatment Details Compression Rate Down: 1.5 psi / minute De-Compression Rate Up: 1.5 psi / minute Air breaks and breathing Compress Tx Pressure Decompress Decompress periods Begins Reached Begins Ends (leave unused spaces blank) Chamber Pressure 1 ATA 2.0 ATA - - - - - - 2.0 ATA 1 ATA Clock Time (24 hr) 08:11 08:23 - - - - - - 09:53 10:04 Treatment Length: 113 (minutes) Treatment Segments: 4 Capillary Blood Glucose Pre Capillary Blood Glucose (mg/dl): Post Capillary Blood Glucose (mg/dl): Vital Signs Capillary Blood Glucose Reference Range: 80 - 120 mg / dl HBO Diabetic Blood Glucose Intervention Range: <131 mg/dl or >249 mg/dl Time Vitals Blood Respiratory Capillary Blood Glucose Pulse Action Type: Pulse: Temperature: Taken: Pressure: Rate: Glucose (mg/dl): Meter #: Oximetry (%) Taken: Pre 07:53 102/62 66 18 98.5 Post 10:25 102/62 66 18 98.4 Treatment Response Well [place], Morene ([id number]) Treatment Toleration: Treatment Treatment Completed without Adverse Event Completion Status: HBO Attestation I certify that  I supervised this HBO treatment in accordance with Medicare guidelines. A trained Yes emergency response team is readily available per hospital policies and procedures. Continue HBOT as ordered. Yes Electronic Signature(s) Signed: 01/02/2015 12:54:03 PM By: Christin Fudge MD, FACS Previous Signature: 01/02/2015 10:54:33 AM Version By: Lorine Bears RCP, RRT, CHT Entered By: Christin Fudge on 01/02/2015 11:40:12 [name] ([id number]) -------------------------------------------------------------------------------- HBO Safety Checklist Details Patient Name: [name] Date of Service: 01/02/2015 8:00 AM Medical Record Number: [id number] Patient Account Number: [id number] Date of Birth/Sex: 08-27-1927 (81 y.o. Female) Treating RN: Primary Care Physician: Constance Goltz Other Clinician: Jacqulyn Bath Referring Physician: Constance Goltz Treating Physician/Extender: Frann Rider in Treatment: 16 HBO Safety Checklist Items Safety Checklist Consent Form Signed Patient voided / foley secured and emptied When did you last eato 07:00 am Last dose of injectable or oral agent n/a Ostomy pouch emptied and vented if applicable n/a All implantable devices assessed, documented and approved n/a Intravenous access site secured and place n/a Valuables secured Linens and cotton and cotton/polyester blend (less than 51% polyester) Personal oil-based products / skin lotions / body lotions removed Wigs or hairpieces removed Smoking or tobacco materials removed Books / newspapers / magazines / loose paper removed Cologne, aftershave, perfume and deodorant removed Jewelry removed (may wrap wedding band) Make-up removed Hair care products removed Battery operated devices (external) removed Heating patches and chemical warmers removed Titanium eyewear removed Nail polish cured greater than 10 hours Casting material cured greater than 10 hours n/a Hearing aids removed  n/a Loose dentures or partials removed Prosthetics have been removed n/a Patient demonstrates correct use of air break device (if applicable) Patient concerns have been addressed Patient grounding bracelet on and cord attached to chamber Specifics for Inpatients (complete in addition to above) Medication sheet sent with patient Intravenous medications needed or due during therapy sent with patient [name], [name] (  [id number]) Drainage tubes (e.g. nasogastric tube or chest tube secured and vented) Endotracheal or Tracheotomy tube secured Cuff deflated of air and inflated with saline Airway suctioned Electronic Signature(s) Signed: 01/02/2015 10:54:33 AM By: Lorine Bears RCP, RRT, CHT Entered By: Becky Sax, Amado Nash on 01/02/2015 08:26:11

## 2015-01-03 ENCOUNTER — Encounter: Payer: Medicaid Other | Admitting: Surgery

## 2015-01-03 DIAGNOSIS — L97529 Non-pressure chronic ulcer of other part of left foot with unspecified severity: Secondary | ICD-10-CM | POA: Diagnosis not present

## 2015-01-03 NOTE — Progress Notes (Addendum)
[name] ([id number]) Visit Report for 01/03/2015 Arrival Information Details Patient Name: [name], [name] Date of Service: 01/03/2015 8:00 AM Medical Record Number: [id number] Patient Account Number: [id number] Date of Birth/Sex: [birth year] (81 y.o. Female) Treating RN: Primary Care Physician: [name] Other Clinician: Jacqulyn [name] Referring Physician: Constance [name] Treating Physician/Extender: [name] in Treatment: 16 Visit Information History Since Last Visit Added or deleted any medications: No Patient Arrived: Wheel Chair Any new allergies or adverse reactions: No Arrival Time: 07:50 Had a fall or experienced change in No Accompanied By: [name], activities of daily living that may affect transport risk of falls: Transfer Assistance: Manual Signs or symptoms of abuse/neglect since last No Patient Identification Verified: Yes visito Secondary Verification Process Yes Hospitalized since last visit: No Completed: Has Dressing in Place as Prescribed: Yes Patient Requires Transmission- No Pain Present Now: No Based Precautions: Patient Has Alerts: Yes Patient Alerts: Patient on Blood Thinner DMII Plavix ABI L 0.63 R 0.79 Electronic Signature(s) Signed: 01/03/2015 4:42:38 PM By: Sharon Mt Previous Signature: 01/03/2015 8:25:06 AM Version By: Lorine Bears RCP, RRT, CHT Entered By: Sharon Mt on 01/03/2015 16:42:38 [name] ([id number]) -------------------------------------------------------------------------------- Encounter Discharge Information Details Patient Name: [name] Date of Service: 01/03/2015 8:00 AM Medical Record Number: [id number] Patient Account Number: [id number] Date of Birth/Sex: [birth year] (81 y.o. Female) Treating RN: Primary Care Physician: [name] Other Clinician: Jacqulyn [name] Referring Physician: Constance [name] Treating Physician/Extender: [name] in Treatment: 16 Encounter Discharge  Information Items Discharge Pain Level: 0 Discharge Condition: Stable Ambulatory Status: Wheelchair Nursing Discharge Destination: Home Transportation: Other [name], Accompanied By: transport Schedule Follow-up Appointment: No Medication Reconciliation completed No and provided to Patient/Care [name]: Clinical Summary of Care: Notes Patient has an HBO treatment scheduled on 01/04/15 at 08:00 am. Electronic Signature(s) Signed: 01/03/2015 8:25:06 AM By: Lorine Bears RCP, RRT, CHT Entered By: Lorine Bears on 01/03/2015 10:43:15 [name], [name] ([id number]) -------------------------------------------------------------------------------- Vitals Details Patient Name: [name] Date of Service: 01/03/2015 8:00 AM Medical Record Number: [id number] Patient Account Number: [id number] Date of Birth/Sex: 06-Mar-1928 (81 y.o. Female) Treating RN: Primary Care Physician: [name] Other Clinician: Jacqulyn [name] Referring Physician: Constance [name] Treating Physician/Extender: [name] in Treatment: 16 Vital Signs Time Taken: 07:50 Temperature (F): 98.6 Height (in): 64 Pulse (bpm): 72 Weight (lbs): 180 Respiratory Rate (breaths/min): 18 Body Mass Index (BMI): 30.9 Blood Pressure (mmHg): 122/70 Reference Range: 80 - 120 mg / dl Electronic Signature(s) Signed: 01/03/2015 8:25:06 AM By: Lorine Bears RCP, RRT, CHT Entered By: [name], [name] on 01/03/2015 [id number]

## 2015-01-03 NOTE — Progress Notes (Addendum)
[name], [name] Visit Report for 01/03/2015 HBO Details Patient Name: [name], [name] Date of Service: 01/03/2015 8:00 AM Medical Record Number: [id number] Patient Account Number: [id number] Date of Birth/Sex: 03-02-1928 (81 y.o. 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, Peripheral HBO Treatment Details Treatment Number: 27 Patient Type: Outpatient Chamber Type: Monoplace Chamber #: [id number] Treatment Protocol: 2.0 ATA with 90 minutes oxygen, and no air breaks Treatment Details Compression Rate Down: 1.5 psi / minute De-Compression Rate Up: 1.5 psi / minute Air breaks and breathing Compress Tx Pressure Decompress Decompress periods Begins Reached Begins Ends (leave unused spaces blank) Chamber Pressure 1 ATA 2.0 ATA - - - - - - 2.0 ATA 1 ATA Clock Time (24 hr) 08:10 08:22 - - - - - - 09:52 10:02 Treatment Length: 112 (minutes) Treatment Segments: 4 Capillary Blood Glucose Pre Capillary Blood Glucose (mg/dl): Post Capillary Blood Glucose (mg/dl): Vital Signs Capillary Blood Glucose Reference Range: 80 - 120 mg / dl HBO Diabetic Blood Glucose Intervention Range: <131 mg/dl or >249 mg/dl Time Vitals Blood Respiratory Capillary Blood Glucose Pulse Action Type: Pulse: Temperature: Taken: Pressure: Rate: Glucose (mg/dl): Meter #: Oximetry (%) Taken: Pre 07:50 122/70 72 18 98.6 Post 10:05 122/72 72 18 97.9 Treatment Response Well [place], [name] Treatment Toleration: Treatment Treatment Completed without Adverse Event Completion Status: HBO Attestation I certify that I supervised  this HBO treatment in accordance with Medicare guidelines. A trained Yes emergency response team is readily available per hospital policies and procedures. Continue HBOT as ordered. Yes Electronic Signature(s) Signed: 01/03/2015 3:18:58 PM By: [name] Grayer MD Previous Signature: 01/03/2015 8:25:06 AM Version By: [name] RCP, RRT, CHT Entered By: [name] on 01/03/2015 14:19:48 [name] ([id number]) -------------------------------------------------------------------------------- HBO Safety Checklist Details Patient Name: [name] Date of Service: 01/03/2015 8:00 AM Medical Record Number: [id number] Patient Account Number: [id number] Date of Birth/Sex: [birth year] (81 y.o. Female) Treating RN: Primary Care Physician: [name] Other Clinician: Jacqulyn [name] Referring Physician: Constance [name] Treating Physician/Extender: [name] in Treatment: 16 HBO Safety Checklist Items Safety Checklist Consent Form Signed Patient voided / foley secured and emptied When did you last eato 07:00 am Last dose of injectable or oral agent n/a Ostomy pouch emptied and vented if applicable n/a All implantable devices assessed, documented and approved n/a Intravenous access site secured and place n/a Valuables secured Linens and cotton and cotton/polyester blend (less than 51% polyester) Personal oil-based products / skin lotions / body lotions removed Wigs or hairpieces removed Smoking or tobacco materials removed Books / newspapers / magazines / loose paper removed Cologne, aftershave, perfume and deodorant removed Jewelry removed (may wrap wedding band) Make-up removed Hair care products removed Battery operated devices (external) removed Heating patches and chemical warmers removed Titanium eyewear removed Nail polish cured greater than 10 hours Casting material cured greater than 10 hours n/a Hearing aids removed n/a Loose dentures or  partials removed Prosthetics have been removed n/a Patient demonstrates correct use of air break device (if applicable) Patient concerns have been addressed Patient grounding bracelet on and cord attached to chamber Specifics for Inpatients (complete in addition to above) Medication sheet sent with patient Intravenous medications needed or due during therapy sent with patient [name], [name] ([id number]) Drainage tubes (  e.g. nasogastric tube or chest tube secured and vented) Endotracheal or Tracheotomy tube secured Cuff deflated of air and inflated with saline Airway suctioned Electronic Signature(s) Signed: 01/03/2015 8:25:06 AM By: [name] RCP, RRT, CHT Entered By: [name], [name] on 01/03/2015 08:29:15

## 2015-01-04 ENCOUNTER — Encounter: Payer: Medicaid Other | Admitting: Surgery

## 2015-01-04 DIAGNOSIS — L97529 Non-pressure chronic ulcer of other part of left foot with unspecified severity: Secondary | ICD-10-CM | POA: Diagnosis not present

## 2015-01-04 NOTE — Progress Notes (Signed)
MERCADES, KOMM ([id number]) Visit Report for 01/04/2015 Arrival Information Details Patient Name: [name], [name] Date of Service: 01/04/2015 8:00 AM Medical Record Number: [id number] Patient Account Number: [id number] Date of Birth/Sex: 07/19/1928 (81 y.o. Female) Treating RN: Primary Care Physician: Constance Goltz Other Clinician: Jacqulyn Bath Referring Physician: Constance Goltz Treating Physician/Extender: Frann Rider in Treatment: 60 Visit Information History Since Last Visit Added or deleted any medications: No Patient Arrived: Wheel Chair Any new allergies or adverse reactions: No Arrival Time: 07:55 Had a fall or experienced change in No Accompanied By: Delfino Lovett, activities of daily living that may affect transport risk of falls: Transfer Assistance: Manual Signs or symptoms of abuse/neglect since last No Patient Identification Verified: Yes visito Secondary Verification Process Yes Hospitalized since last visit: No Completed: Has Dressing in Place as Prescribed: Yes Patient Requires Transmission- No Pain Present Now: No Based Precautions: Patient Has Alerts: Yes Patient Alerts: Patient on Blood Thinner DMII Plavix ABI L 0.63 R 0.79 Electronic Signature(s) Signed: 01/04/2015 11:00:10 AM By: Lorine Bears RCP, RRT, CHT Entered By: Lorine Bears on 01/04/2015 08:34:15 [name] ([id number]) -------------------------------------------------------------------------------- Encounter Discharge Information Details Patient Name: [name] Date of Service: 01/04/2015 8:00 AM Medical Record Number: [id number] Patient Account Number: [id number] Date of Birth/Sex: Oct 21, 1927 (81 y.o. Female) Treating RN: Primary Care Physician: Constance Goltz Other Clinician: Jacqulyn Bath Referring Physician: Constance Goltz Treating Physician/Extender: Frann Rider in Treatment: 58 Encounter Discharge Information Items Discharge Pain  Level: 0 Discharge Condition: Stable Ambulatory Status: Wheelchair Nursing Discharge Destination: Home Transportation: Other [name], Accompanied By: transport Schedule Follow-up Appointment: No Medication Reconciliation completed No and provided to Patient/Care [name]: Clinical Summary of Care: Notes Patient has an HBO treatment scheduled on 01/05/15 at 08:00 am. Electronic Signature(s) Signed: 01/04/2015 11:00:10 AM By: Lorine Bears RCP, RRT, CHT Entered By: Lorine Bears on 01/04/2015 10:52:47 [name] ([id number]) -------------------------------------------------------------------------------- Vitals Details Patient Name: [name] Date of Service: 01/04/2015 8:00 AM Medical Record Number: [id number] Patient Account Number: [id number] Date of Birth/Sex: [birth year] (81 y.o. Female) Treating RN: Primary Care Physician: Constance Goltz Other Clinician: Jacqulyn Bath Referring Physician: Constance Goltz Treating Physician/Extender: Frann Rider in Treatment: 16 Vital Signs Time Taken: 07:55 Temperature (F): 98.3 Height (in): 64 Pulse (bpm): 78 Weight (lbs): 180 Respiratory Rate (breaths/min): 18 Body Mass Index (BMI): 30.9 Blood Pressure (mmHg): 142/76 Reference Range: 80 - 120 mg / dl Electronic Signature(s) Signed: 01/04/2015 11:00:10 AM By: Lorine Bears RCP, RRT, CHT Entered By: Lorine Bears on 01/04/2015 08:34:35

## 2015-01-04 NOTE — Progress Notes (Signed)
SHERIAN, BIGNESS ([id number]) Visit Report for 01/04/2015 HBO Details Patient Name: [name], [name] Date of Service: 01/04/2015 8:00 AM Medical Record Number: [id number] Patient Account Number: [id number] Date of Birth/Sex: [birth year] (81 y.o. 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, Peripheral HBO Treatment Details Treatment Number: 28 Patient Type: Outpatient Chamber Type: Monoplace Chamber #: [id number] Treatment Protocol: 2.0 ATA with 90 minutes oxygen, and no air breaks Treatment Details Compression Rate Down: 1.5 psi / minute De-Compression Rate Up: 1.5 psi / minute Air breaks and breathing Compress Tx Pressure Decompress Decompress periods Begins Reached Begins Ends (leave unused spaces blank) Chamber Pressure 1 ATA 2.0 ATA - - - - - - 2.0 ATA 1 ATA Clock Time (24 hr) 08:14 08:25 - - - - - - 09:56 10:08 Treatment Length: 114 (minutes) Treatment Segments: 4 Capillary Blood Glucose Pre Capillary Blood Glucose (mg/dl): Post Capillary Blood Glucose (mg/dl): Vital Signs Capillary Blood Glucose Reference Range: 80 - 120 mg / dl HBO Diabetic Blood Glucose Intervention Range: <131 mg/dl or >249 mg/dl Time Vitals Blood Respiratory Capillary Blood Glucose Pulse Action Type: Pulse: Temperature: Taken: Pressure: Rate: Glucose (mg/dl): Meter #: Oximetry (%) Taken: Pre 07:55 142/76 78 18 98.3 Post 10:15 106/72 66 18 96.8 Treatment Response Well [name], [name] ([id number]) Treatment Toleration: Treatment Treatment Completed without Adverse Event Completion Status: HBO Attestation I certify that  I supervised this HBO treatment in accordance with Medicare guidelines. A trained Yes emergency response team is readily available per hospital policies and procedures. Continue HBOT as ordered. Yes Electronic Signature(s) Signed: 01/04/2015 12:32:56 PM By: Christin Fudge MD, FACS Previous Signature: 01/04/2015 11:00:10 AM Version By: Lorine Bears RCP, RRT, CHT Entered By: Christin Fudge on 01/04/2015 11:11:10 [name] ([id number]) -------------------------------------------------------------------------------- HBO Safety Checklist Details Patient Name: [name] Date of Service: 01/04/2015 8:00 AM Medical Record Number: [id number] Patient Account Number: [id number] Date of Birth/Sex: 1928/06/01 (81 y.o. Female) Treating RN: Primary Care Physician: Constance Goltz Other Clinician: Jacqulyn Bath Referring Physician: Constance Goltz Treating Physician/Extender: Frann Rider in Treatment: 16 HBO Safety Checklist Items Safety Checklist Consent Form Signed Patient voided / foley secured and emptied When did you last eato 07:00 am Last dose of injectable or oral agent n/a Ostomy pouch emptied and vented if applicable n/a All implantable devices assessed, documented and approved n/a Intravenous access site secured and place n/a Valuables secured Linens and cotton and cotton/polyester blend (less than 51% polyester) Personal oil-based products / skin lotions / body lotions removed Wigs or hairpieces removed Smoking or tobacco materials removed Books / newspapers / magazines / loose paper removed Cologne, aftershave, perfume and deodorant removed Jewelry removed (may wrap wedding band) Make-up removed Hair care products removed Battery operated devices (external) removed Heating patches and chemical warmers removed Titanium eyewear removed Nail polish cured greater than 10 hours Casting material cured greater than 10 hours n/a Hearing aids removed  n/a Loose dentures or partials removed Prosthetics have been removed n/a Patient demonstrates correct use of air break device (if applicable) Patient concerns have been addressed Patient grounding bracelet on and cord attached to chamber Specifics for Inpatients (complete in addition to above) Medication sheet sent with patient Intravenous medications needed or due during therapy sent with patient Wynetta [name], [name] (  [id number]) Drainage tubes (e.g. nasogastric tube or chest tube secured and vented) Endotracheal or Tracheotomy tube secured Cuff deflated of air and inflated with saline Airway suctioned Electronic Signature(s) Signed: 01/04/2015 11:00:10 AM By: Lorine Bears RCP, RRT, CHT Entered By: Lorine Bears on 01/04/2015 08:35:30

## 2015-01-05 ENCOUNTER — Encounter: Payer: Medicare Other | Admitting: Surgery

## 2015-01-05 ENCOUNTER — Encounter: Payer: Medicaid Other | Admitting: Surgery

## 2015-01-05 DIAGNOSIS — L97529 Non-pressure chronic ulcer of other part of left foot with unspecified severity: Secondary | ICD-10-CM | POA: Diagnosis not present

## 2015-01-05 NOTE — Progress Notes (Signed)
LISSETE, ROOKER ([id number]) Visit Report for 01/05/2015 HBO Details Patient Name: [name], [name] Date of Service: 01/05/2015 8:00 AM Medical Record Number: [id number] Patient Account Number: [id number] Date of Birth/Sex: 23-Sep-1927 (81 y.o. 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, Peripheral HBO Treatment Details Treatment Number: 29 Patient Type: Outpatient Chamber Type: Monoplace Chamber #: [id number] Treatment Protocol: 2.0 ATA with 90 minutes oxygen, and no air breaks Treatment Details Compression Rate Down: 1.5 psi / minute De-Compression Rate Up: 1.5 psi / minute Air breaks and breathing Compress Tx Pressure Decompress Decompress periods Begins Reached Begins Ends (leave unused spaces blank) Chamber Pressure 1 ATA 2.0 ATA - - - - - - 2.0 ATA 1 ATA Clock Time (24 hr) 08:05 08:17 - - - - - - 09:47 09:58 Treatment Length: 113 (minutes) Treatment Segments: 4 Capillary Blood Glucose Pre Capillary Blood Glucose (mg/dl): Post Capillary Blood Glucose (mg/dl): Vital Signs Capillary Blood Glucose Reference Range: 80 - 120 mg / dl HBO Diabetic Blood Glucose Intervention Range: <131 mg/dl or >249 mg/dl Time Vitals Blood Respiratory Capillary Blood Glucose Pulse Action Type: Pulse: Temperature: Taken: Pressure: Rate: Glucose (mg/dl): Meter #: Oximetry (%) Taken: Pre 07:50 110/70 60 18 98.7 Post 10:20 122/74 72 18 98.3 Treatment Response Well [place], Adenike ([id number]) Treatment Toleration: Treatment Treatment Completed without Adverse Event Completion Status: HBO Attestation I certify that  I supervised this HBO treatment in accordance with Medicare guidelines. A trained Yes emergency response team is readily available per hospital policies and procedures. Continue HBOT as ordered. Yes Electronic Signature(s) Signed: 01/05/2015 12:33:17 PM By: Christin Fudge MD, FACS Entered By: Christin Fudge on 01/05/2015 10:50:42 [name] ([id number]) -------------------------------------------------------------------------------- HBO Safety Checklist Details Patient Name: [name] Date of Service: 01/05/2015 8:00 AM Medical Record Number: [id number] Patient Account Number: [id number] Date of Birth/Sex: December 02, 1927 (81 y.o. Female) Treating RN: Primary Care Physician: Constance Goltz Other Clinician: Jacqulyn Bath Referring Physician: Constance Goltz Treating Physician/Extender: Frann Rider in Treatment: 16 HBO Safety Checklist Items Safety Checklist Consent Form Signed Patient voided / foley secured and emptied When did you last eato 07:00 am Last dose of injectable or oral agent n/a Ostomy pouch emptied and vented if applicable n/a All implantable devices assessed, documented and approved n/a Intravenous access site secured and place n/a Valuables secured Linens and cotton and cotton/polyester blend (less than 51% polyester) Personal oil-based products / skin lotions / body lotions removed Wigs or hairpieces removed Smoking or tobacco materials removed Books / newspapers / magazines / loose paper removed Cologne, aftershave, perfume and deodorant removed Jewelry removed (may wrap wedding band) Make-up removed n/a Hair care products removed Battery operated devices (external) removed Heating patches and chemical warmers removed Titanium eyewear removed Nail polish cured greater than 10 hours Casting material cured greater than 10 hours n/a Hearing aids removed n/a Loose dentures or partials removed Prosthetics have been removed n/a Patient demonstrates  correct use of air break device (if applicable) Patient concerns have been addressed Patient grounding bracelet on and cord attached to chamber Specifics for Inpatients (complete in addition to above) Medication sheet sent with patient Intravenous medications needed or due during therapy sent with patient [name], [name] ([id number]) Drainage tubes (e.g. nasogastric tube or chest tube secured and vented)  Endotracheal or Tracheotomy tube secured Cuff deflated of air and inflated with saline Airway suctioned Electronic Signature(s) Signed: 01/05/2015 3:16:07 PM By: Lorine Bears RCP, RRT, CHT Entered By: Becky Sax, Amado Nash on 01/05/2015 08:21:35

## 2015-01-05 NOTE — Progress Notes (Signed)
[name], [name] ([id number]) Visit Report for 01/05/2015 Arrival Information Details Patient Name: [name], [name] Date of Service: 01/05/2015 8:00 AM Medical Record Number: [id number] Patient Account Number: [id number] Date of Birth/Sex: Nov 29, 1927 (81 y.o. Female) Treating RN: Primary Care Physician: Constance Goltz Other Clinician: Jacqulyn Bath Referring Physician: Constance Goltz Treating Physician/Extender: Frann Rider in Treatment: 64 Visit Information History Since Last Visit Added or deleted any medications: No Patient Arrived: Wheel Chair Any new allergies or adverse reactions: No Arrival Time: 07:50 Had a fall or experienced change in No Accompanied By: Delfino Lovett, activities of daily living that may affect transport risk of falls: Transfer Assistance: Manual Signs or symptoms of abuse/neglect since last No Patient Identification Verified: Yes visito Secondary Verification Process Yes Hospitalized since last visit: No Completed: Has Dressing in Place as Prescribed: Yes Patient Requires Transmission- No Pain Present Now: No Based Precautions: Patient Has Alerts: Yes Patient Alerts: Patient on Blood Thinner DMII Plavix ABI L 0.63 R 0.79 Electronic Signature(s) Signed: 01/05/2015 3:16:07 PM By: Lorine Bears RCP, RRT, CHT Entered By: Lorine Bears on 01/05/2015 08:20:14 [name] ([id number]) -------------------------------------------------------------------------------- Encounter Discharge Information Details Patient Name: [name] Date of Service: 01/05/2015 8:00 AM Medical Record Number: [id number] Patient Account Number: [id number] Date of Birth/Sex: [birth year] (81 y.o. Female) Treating RN: Primary Care Physician: Constance Goltz Other Clinician: Jacqulyn Bath Referring Physician: Constance Goltz Treating Physician/Extender: Frann Rider in Treatment: 19 Encounter Discharge Information Items Discharge Pain  Level: 0 Discharge Condition: Stable Ambulatory Status: Wheelchair Nursing Discharge Destination: Home Transportation: Other [name], Accompanied By: transport Schedule Follow-up Appointment: No Medication Reconciliation completed No and provided to Patient/Care [name]: Clinical Summary of Care: Notes Patient has her final HBO treatment scheduled on 01/08/15 at 13:00 pm. Electronic Signature(s) Signed: 01/05/2015 3:16:07 PM By: Lorine Bears RCP, RRT, CHT Entered By: Lorine Bears on 01/05/2015 10:35:54 [name], [name] ([id number]) -------------------------------------------------------------------------------- Vitals Details Patient Name: [name] Date of Service: 01/05/2015 8:00 AM Medical Record Number: [id number] Patient Account Number: [id number] Date of Birth/Sex: [birth year] (81 y.o. Female) Treating RN: Primary Care Physician: Constance Goltz Other Clinician: Jacqulyn Bath Referring Physician: Constance Goltz Treating Physician/Extender: Frann Rider in Treatment: 16 Vital Signs Time Taken: 07:50 Temperature (F): 98.7 Height (in): 64 Pulse (bpm): 60 Weight (lbs): 180 Respiratory Rate (breaths/min): 18 Body Mass Index (BMI): 30.9 Blood Pressure (mmHg): 110/70 Reference Range: 80 - 120 mg / dl Electronic Signature(s) Signed: 01/05/2015 3:16:07 PM By: Lorine Bears RCP, RRT, CHT Entered By: Becky Sax, Amado Nash on 01/05/2015 08:20:45

## 2015-01-05 NOTE — Progress Notes (Signed)
[name], [name] ([id number]) Visit Report for 01/04/2015 Chief Complaint Document Details Patient Name: [name], [name] Date of Service: 01/04/2015 10:15 AM Medical Record Number: [id number] Patient Account Number: [id number] Date of Birth/Sex: January 18, 1928 (81 y.o. 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 She also has some superficial ulcerations on the second and third toe dorsum. she has no family member with her today and she is a very poor historian and cannot give a proper history. As stated that she walks around with a walker. Electronic Signature(s) Signed: 01/04/2015 12:32:56 PM By: Christin Fudge MD, FACS Entered By: Christin Fudge on 01/04/2015 11:04:14 [name] ([id number]) -------------------------------------------------------------------------------- Debridement Details Patient Name: [name] Date of Service: 01/04/2015 10:15 AM Medical Record Number: [id number] Patient Account Number: [id number] Date of Birth/Sex: Jun 03, 1999 (81 y.o. Female) Treating RN: Primary Care Physician: Constance Goltz Other Clinician: Referring Physician: Constance Goltz Treating Physician/Extender: Frann Rider in Treatment: 16 Debridement Performed for Wound #1 Left,Lateral Calcaneous Assessment: Performed By: Physician Pat Patrick., MD Debridement: Open Wound/Selective Debridement Selective Description: Pre-procedure Yes Verification/Time Out Taken: Start Time: 10:52 Pain Control: Lidocaine 4% Topical Solution Level: Non-Viable Tissue Total Area Debrided (L x 1 (cm) x 0.8 (cm) = 0.8 (cm) W): Tissue and other Non-Viable, Eschar, Fibrin/Slough material  debrided: Instrument: Forceps, Other Bleeding: Minimum Hemostasis Achieved: Pressure End Time: 10:54 Procedural Pain: 0 Post Procedural Pain: 0 Response to Treatment: Procedure was tolerated well Post Debridement Measurements of Total Wound Length: (cm) 1 Width: (cm) 0.8 Depth: (cm) 0.5 Volume: (cm) 0.314 Electronic Signature(s) Signed: 01/04/2015 12:32:56 PM By: Christin Fudge MD, FACS Entered By: Christin Fudge on 01/04/2015 11:02:45 [name] ([id number]) -------------------------------------------------------------------------------- Debridement Details Patient Name: [name] Date of Service: 01/04/2015 10:15 AM Medical Record Number: [id number] Patient Account Number: [id number] Date of Birth/Sex: 13-Aug-1999 (81 y.o. Female) Treating RN: Primary Care Physician: Constance Goltz Other Clinician: Referring Physician: Constance Goltz Treating Physician/Extender: Frann Rider in Treatment: 16 Debridement Performed for Wound #5 Right Calcaneous Assessment: Performed By: Physician Pat Patrick., MD Debridement: Debridement Pre-procedure Yes Verification/Time Out Taken: Start Time: 10:55 Pain Control: Lidocaine 4% Topical Solution Level: Skin/Subcutaneous Tissue Total Area Debrided (L x 1.2 (cm) x 0.7 (cm) = 0.84 (cm) W): Tissue and other Viable, Non-Viable, Fibrin/Slough, Subcutaneous material debrided: Instrument: Forceps, Scissors, Other : gauze Bleeding: Minimum Hemostasis Achieved: Pressure End Time: 10:58 Procedural Pain: 0 Post Procedural Pain: 0 Response to Treatment: Procedure was tolerated well Post Debridement Measurements of Total Wound Length: (cm) 1.2 Width: (cm) 0.7 Depth: (cm) 0.3 Volume: (cm) 0.198 Electronic Signature(s) Signed: 01/04/2015 12:32:56 PM By: Christin Fudge MD, FACS Entered By: Christin Fudge on 01/04/2015 11:03:12 [name]  ([id number]) -------------------------------------------------------------------------------- Debridement Details Patient Name: [name] Date of Service: 01/04/2015 10:15 AM Medical Record Number: [id number] Patient Account Number: [id number] Date of Birth/Sex: 1999-06-25 (81 y.o. Female) Treating RN: Primary Care Physician: Constance Goltz Other Clinician: Referring Physician: Constance Goltz Treating Physician/Extender: Frann Rider in Treatment: 16 Debridement Performed for Wound #6 Left Achilles Assessment: Performed By: Physician Pat Patrick., MD Debridement: Open Wound/Selective Debridement Selective Description: Pre-procedure Yes Verification/Time Out Taken: Start Time: 10:54 Pain Control: Lidocaine 4% Topical Solution Level: Non-Viable Tissue Total Area Debrided (L x 0.8 (cm) x 0.7 (cm) = 0.56 (cm) W): Tissue and  other Viable, Non-Viable, Fibrin/Slough, Subcutaneous material debrided: Instrument: Forceps Bleeding: None End Time: 10:55 Procedural Pain: 0 Post Procedural Pain: 0 Response to Treatment: Procedure was tolerated well Post Debridement Measurements of Total Wound Length: (cm) 0.8 Width: (cm) 0.7 Depth: (cm) 0.2 Volume: (cm) 0.088 Electronic Signature(s) Signed: 01/04/2015 12:32:56 PM By: Christin Fudge MD, FACS Entered By: Christin Fudge on 01/04/2015 [name], [name] ([id number]) -------------------------------------------------------------------------------- HPI Details Patient Name: [name] Date of Service: 01/04/2015 10:15 AM Medical Record Number: [id number] Patient Account Number: [id number] Date of Birth/Sex: 06/25/1999 (81 y.o. Female) Treating RN: Primary Care Physician: Constance Goltz Other Clinician: Referring Physician: Constance Goltz Treating Physician/Extender: Frann Rider in Treatment: 16 History of Present Illness Location: left heel Quality: Patient reports experiencing a dull pain to affected  area(s). Severity: Patient states wound are getting worse. Duration: Patient states that they are not certain how long the wound has been present. Timing: Pain in wound is Intermittent (comes and goes Context: The wound appeared gradually over time Associated Signs and Symptoms: Patient reports having difficulty standing for long periods. HPI Description: This 81 year old patient is a poor historian and comes from a nursing home. Does not have any family members with her. Says she has a ulcer on the lateral part of her left foot and some new ones are there on her right foot too. She is unable to say how long she's had these. she does walk with a walker and this is limited most of the day. Does not recall if she has had any vascular studies done. I have reviewed an x-ray done of the left foot which basically shows osteoporosis but no evidence of fracture dislocation or osteomyelitis. 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 She seems to be doing better overall. 10/19/14 -- I understand she has gone to the vascular surgery and lab yesterday and has had all her tests done yesterday. She is doing fine otherwise and has had no fresh issues. 10/26/2014 she seems to be doing well and has no fresh problems and seems much brighter. 10/12/14 -- Taking her antibiotic and continues to do her dressings locally and she is helped by her skilled nursing. She seems to be doing fine and has no fresh issues. 10/19/14 -- she is doing fine and has had no fresh issues. She says her son to go for her vascular workup yesterday and she has been called back after 3 months. we will try and gather these reports to review what they said. 10/26/14 --I have been able to review notes from the vascular surgery office and these were dated from 10/18/2014. He  patient was on her first postop visit and had Doppler ultrasounds done of her arteries. She had more than 50% stenosis of the right superficial femoral artery and more than 50% stenosis in the left tibioperoneal trunk. She was status post a left lower extremity angiogram with angioplasty of the peroneal and left superficial femoral arteries for a nonhealing left foot and ankle ulceration. note is made of the fact that the ABIs had not changed in the last month since her previous visit. The opinion of her provider was that she did not need any intervention at this time and they had done everything they could at the present time. They would continue on Plavix and antibiotics and see her back in  3 months for a another arterial duplex study of the lower extremities. [name], [name] ([id number]) 11/02/2014 -- she has spoken to her caregivers about coming for HBO 5 times per week for 6-8 weeks and they are agreeable about this and she is willing to undergo hyperbaric oxygen therapy. 11/16/2014 she has been worked up with a EKG and chest x-ray and these were within normal limits. As far as the investigations go she is ready for HBOT. we are awaiting some insurance clearance so that she can start on her hyperbaric oxygen therapy. 11/30/2014 she tolerated hyperbaric oxygen therapy fine and there were no issues with her blood glucose levels. As noted she is not a diabetic and most of her fingerstick blood glucose levels are inaccurate because of the Raynaud's phenomena. She normally has to have your lobe sticks to get any random glucose levels. I have also requested her primary care does a venous blood glucose draw to check her glucose level. 12/21/2014 -- she is tolerating hyperbaric oxygen very well and has overall made a lot of progress. Her left leg is looking much better. Electronic Signature(s) Signed: 01/04/2015 12:32:56 PM By: Christin Fudge MD, FACS Entered By: Christin Fudge on 01/04/2015  11:04:22 [name] ([id number]) -------------------------------------------------------------------------------- Physical Exam Details Patient Name: [name] Date of Service: 01/04/2015 10:15 AM Medical Record Number: [id number] Patient Account Number: [id number] Date of Birth/Sex: July 30, 1928 (81 y.o. Female) Treating RN: Primary Care Physician: Constance Goltz Other Clinician: Referring Physician: Constance Goltz Treating Physician/Extender: Frann Rider in Treatment: 16 Constitutional . Pulse regular. Respirations normal and unlabored. Afebrile. . Eyes Nonicteric. Reactive to light. Ears, Nose, Mouth, and Throat Lips, teeth, and gums WNL.Marland Kitchen Moist mucosa without lesions . Neck supple and nontender. No palpable supraclavicular or cervical adenopathy. Normal sized without goiter. Respiratory WNL. No retractions.. Cardiovascular Pedal Pulses WNL. No clubbing, cyanosis or edema. Integumentary (Hair, Skin) her left leg looks very good and minimal debris was there which was mainly fibrin. Right heel still has some eschar and necrotic debris and this will be sharply debrided.. No crepitus or fluctuance. No peri-wound warmth or erythema. No masses.Marland Kitchen Psychiatric Judgement and insight Intact.. No evidence of depression, anxiety, or agitation.. Electronic Signature(s) Signed: 01/04/2015 12:32:56 PM By: Christin Fudge MD, FACS Entered By: Christin Fudge on 01/04/2015 11:05:10 [name] ([id number]) -------------------------------------------------------------------------------- Physician Orders Details Patient Name: [name] Date of Service: 01/04/2015 10:15 AM Medical Record Number: [id number] Patient Account Number: [id number] Date of Birth/Sex: 02/14/1928 (81 y.o. Female) Treating RN: Montey Hora Primary Care Physician: Constance Goltz Other Clinician: Referring Physician: Constance Goltz Treating Physician/Extender: Frann Rider in Treatment: 40 Verbal /  Phone Orders: Yes Clinician: Montey Hora Read Back and Verified: Yes Diagnosis Coding Wound Cleansing Wound #1 Left,Lateral Calcaneous o Clean wound with Normal Saline. - be sure to clean out all remaining prisma Wound #5 Right Calcaneous o Clean wound with Normal Saline. - be sure to clean out all remaining prisma Wound #6 Left Achilles o Clean wound with Normal Saline. - be sure to clean out all remaining prisma Anesthetic Wound #1 Left,Lateral Calcaneous o Topical Lidocaine 4% cream applied to wound bed prior to debridement o Hurricaine Topical Anesthetic Spray applied to wound bed prior to debridement Wound #5 Right Calcaneous o Topical Lidocaine 4% cream applied to wound bed prior to debridement o Hurricaine Topical Anesthetic Spray applied to wound bed prior to debridement Wound #6 Left Achilles o Topical Lidocaine 4% cream applied to wound bed prior to debridement o Hurricaine Topical Anesthetic Spray  applied to wound bed prior to debridement Primary Wound Dressing Wound #1 Left,Lateral Calcaneous o Prisma Ag Wound #5 Right Calcaneous o Santyl Ointment Wound #6 Left Achilles o Prisma Ag Secondary Dressing Wound #1 Left,Lateral Calcaneous o ABD and Kerlix/Conform Oketo, Doretta ([id number]) Wound #5 Right Calcaneous o ABD and Kerlix/Conform Wound #6 Left Achilles o ABD and Kerlix/Conform Dressing Change Frequency Wound #1 Left,Lateral Calcaneous o Change dressing every day. Wound #5 Right Calcaneous o Change dressing every day. Wound #6 Left Achilles o Change dressing every day. Follow-up Appointments Wound #1 Left,Lateral Calcaneous o Return Appointment in 1 week. Wound #5 Right Calcaneous o Return Appointment in 1 week. Wound #6 Left Achilles o Return Appointment in 1 week. Off-Loading Wound #1 Left,Lateral Calcaneous o Other: - SAGE boots while sitting or lying. Do not wear while ambulating. Wound #5 Right  Calcaneous o Other: - SAGE boots while sitting or lying. Do not wear while ambulating. Wound #6 Left Achilles o Other: - SAGE boots while sitting or lying. Do not wear while ambulating. Home Health Wound #1 [place] Visits - [place] Nurse may visit PRN to address patientos wound care needs. o FACE TO FACE ENCOUNTER: MEDICARE and MEDICAID PATIENTS: I certify that this patient is under my care and that I had a face-to-face encounter that meets the physician face-to-face encounter requirements with this patient on this date. The encounter with the patient was in whole or in part for the following MEDICAL CONDITION: (primary reason for [place]) MEDICAL NECESSITY: I certify, that based on my findings, NURSING services are a medically necessary home health service. HOME BOUND STATUS: I certify that my clinical findings support that this patient is homebound (i.e., Due to illness or injury, pt requires aid of NEENAH, MARCHAND ([id number]) supportive devices such as crutches, cane, wheelchairs, walkers, the use of special transportation or the assistance of another person to leave their place of residence. There is a normal inability to leave the home and doing so requires considerable and taxing effort. Other absences are for medical reasons / religious services and are infrequent or of short duration when for other reasons). o If current dressing causes regression in wound condition, may D/C ordered dressing product/s and apply Normal Saline Moist Dressing daily until next [place] / Other MD appointment. [place] of regression in wound condition at [phone]. o Please direct any NON-WOUND related issues/requests for orders to patient's Primary Care Physician Wound #5 Right [place] Visits - [place] Nurse may visit PRN to address patientos wound  care needs. o FACE TO FACE ENCOUNTER: MEDICARE and MEDICAID PATIENTS: I certify that this patient is under my care and that I had a face-to-face encounter that meets the physician face-to-face encounter requirements with this patient on this date. The encounter with the patient was in whole or in part for the following MEDICAL CONDITION: (primary reason for [place]) MEDICAL NECESSITY: I certify, that based on my findings, NURSING services are a medically necessary home health service. HOME BOUND STATUS: I certify that my clinical findings support that this patient is homebound (i.e., Due to illness or injury, pt requires aid of supportive devices such as crutches, cane, wheelchairs, walkers, the use of special transportation or the assistance of another person to leave their place of residence. There is a normal inability to leave the home and doing so requires considerable and taxing effort. Other absences are for medical  reasons / religious services and are infrequent or of short duration when for other reasons). o If current dressing causes regression in wound condition, may D/C ordered dressing product/s and apply Normal Saline Moist Dressing daily until next [place] / Other MD appointment. [place] of regression in wound condition at [phone]. o Please direct any NON-WOUND related issues/requests for orders to patient's Primary Care Physician Wound #6 Left Achilles o [place] Visits - [place] Nurse may visit PRN to address patientos wound care needs. o FACE TO FACE ENCOUNTER: MEDICARE and MEDICAID PATIENTS: I certify that this patient is under my care and that I had a face-to-face encounter that meets the physician face-to-face encounter requirements with this patient on this date. The encounter with the patient was in whole or in part for the following MEDICAL CONDITION: (primary reason for [place]) MEDICAL NECESSITY: I certify, that based on my findings, NURSING services are a medically necessary home health service. HOME BOUND STATUS: I certify that my clinical findings support that this patient is homebound (i.e., Due to illness or injury, pt requires aid of supportive devices such as crutches, cane, wheelchairs, walkers, the use of special transportation or the assistance of another person to leave their place of residence. There is a normal inability to leave the home and doing so requires considerable and taxing effort. Other absences are for medical reasons / religious services and are infrequent or of short duration when for other reasons). o If current dressing causes regression in wound condition, may D/C ordered dressing product/s and apply Normal Saline Moist Dressing daily until next [place] / Other MD appointment. [place] of regression in wound condition at [phone]. [name], [name] ([id number]) o Please direct any NON-WOUND related issues/requests for orders to patient's Primary Care Physician Hyperbaric Oxygen Therapy Wound #1 Left,Lateral Calcaneous o Indication: - acute arterial insufficiency o If appropriate for treatment, begin HBOT per protocol: o 2.0 ATA for 90 Minutes without Air Breaks o One treatment per day (delivered Monday through Friday unless otherwise specified in Special Instructions below): o Total # of Treatments: - 30 Wound #5 Right Calcaneous o Indication: - acute arterial insufficiency o If appropriate for treatment, begin HBOT per protocol: o 2.0 ATA for 90 Minutes without Air Breaks o One treatment per day (delivered Monday through Friday unless otherwise specified in Special Instructions below): o Total # of Treatments: - 30 Wound #6 Left Achilles o Indication: - acute arterial insufficiency o If appropriate for treatment, begin HBOT per protocol: o 2.0 ATA for  90 Minutes without Air Breaks o One treatment per day (delivered Monday through Friday unless otherwise specified in Special Instructions below): o Total # of Treatments: - 30 HBO Contraindications Wound #1 Left,Lateral Calcaneous - Bilateral Lower Extremities o HBO contraindications of hyperbaric oxygen therapy were reviewed and the patient found to have no untreated pneumothorax or history of spontaneous pneumothorax. o HBO contraindications of hyperbaric oxygen therapy were reviewed and the patient found to have no history of medications such as Bleomycin, Adriamycin, disulfiram, cisplatin and sulfamylon and is not currently receiving any chemotherapy. o HBO contraindications of hyperbaric oxygen therapy were reviewed and the patient found to have no Upper respiratory infection and chronic sinusitis. o HBO contraindications of hyperbaric oxygen therapy were reviewed and the patient found to have no history of retinal surgery proceeding 6 weeks or intraocular gas o HBO contraindications of hyperbaric oxygen therapy were reviewed and the  patient found to have no history seizure disorder or any anticonvulsant medication. o HBO contraindications of hyperbaric oxygen therapy were reviewed and the patient found to have no septicemia with CO2 retention. o HBO contraindications of hyperbaric oxygen therapy were reviewed and the patient found to have no fever greater than 100 degrees. o HBO contraindications of hyperbaric oxygen therapy were reviewed and the patient found to have no pregnancy noted. [name], [name] ([id number]) o HBO contraindications of hyperbaric oxygen therapy were reviewed and the patient found to have no medications such as steroids or narcotics or Phenergan. Wound #5 Right Calcaneous - Bilateral Lower Extremities o HBO contraindications of hyperbaric oxygen therapy were reviewed and the patient found to have no untreated pneumothorax or history of  spontaneous pneumothorax. o HBO contraindications of hyperbaric oxygen therapy were reviewed and the patient found to have no history of medications such as Bleomycin, Adriamycin, disulfiram, cisplatin and sulfamylon and is not currently receiving any chemotherapy. o HBO contraindications of hyperbaric oxygen therapy were reviewed and the patient found to have no Upper respiratory infection and chronic sinusitis. o HBO contraindications of hyperbaric oxygen therapy were reviewed and the patient found to have no history of retinal surgery proceeding 6 weeks or intraocular gas o HBO contraindications of hyperbaric oxygen therapy were reviewed and the patient found to have no history seizure disorder or any anticonvulsant medication. o HBO contraindications of hyperbaric oxygen therapy were reviewed and the patient found to have no septicemia with CO2 retention. o HBO contraindications of hyperbaric oxygen therapy were reviewed and the patient found to have no fever greater than 100 degrees. o HBO contraindications of hyperbaric oxygen therapy were reviewed and the patient found to have no pregnancy noted. o HBO contraindications of hyperbaric oxygen therapy were reviewed and the patient found to have no medications such as steroids or narcotics or Phenergan. Wound #6 Left Achilles - Bilateral Lower Extremities o HBO contraindications of hyperbaric oxygen therapy were reviewed and the patient found to have no untreated pneumothorax or history of spontaneous pneumothorax. o HBO contraindications of hyperbaric oxygen therapy were reviewed and the patient found to have no history of medications such as Bleomycin, Adriamycin, disulfiram, cisplatin and sulfamylon and is not currently receiving any chemotherapy. o HBO contraindications of hyperbaric oxygen therapy were reviewed and the patient found to have no Upper respiratory infection and chronic sinusitis. o HBO  contraindications of hyperbaric oxygen therapy were reviewed and the patient found to have no history of retinal surgery proceeding 6 weeks or intraocular gas o HBO contraindications of hyperbaric oxygen therapy were reviewed and the patient found to have no history seizure disorder or any anticonvulsant medication. o HBO contraindications of hyperbaric oxygen therapy were reviewed and the patient found to have no septicemia with CO2 retention. o HBO contraindications of hyperbaric oxygen therapy were reviewed and the patient found to have no fever greater than 100 degrees. o HBO contraindications of hyperbaric oxygen therapy were reviewed and the patient found to have no pregnancy noted. o HBO contraindications of hyperbaric oxygen therapy were reviewed and the patient found to have no medications such as steroids or narcotics or Phenergan. Medications-please add to medication list. Wound #5 Right Calcaneous - Bilateral Lower Extremities o Santyl Enzymatic Ointment NIDRA, RULON ([id number]) Electronic Signature(s) Signed: 01/04/2015 12:32:56 PM By: Christin Fudge MD, FACS Signed: 01/04/2015 4:34:18 PM By: Montey Hora Entered By: Montey Hora on 01/04/2015 10:53:20 [name] ([id number]) -------------------------------------------------------------------------------- Problem List Details Patient Name: [name] Date of Service: 01/04/2015 10:15 AM  Medical Record Number: [id number] Patient Account Number: [id number] Date of Birth/Sex: [birth year] (81 y.o. 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 Female) Treating RN: Primary Care Physician: Constance Goltz Other Clinician: Referring Physician: Constance Goltz Treating Physician/Extender: Frann Rider in Treatment: 16 Subjective Chief Complaint Information obtained from Patient Patient presents to the wound care center today with an open arterial ulcer on the left foot big toe and lateral heal. She also has some superficial ulcerations on the second and third toe dorsum. she has no family member with her today and she is a very poor historian and cannot give a proper history. As stated that she walks around with a walker. History of Present Illness (HPI) The following HPI elements were documented for the patient's wound: Location: left heel Quality: Patient reports experiencing a dull pain to affected area(s). Severity: Patient states wound are getting worse. Duration: Patient states that they are not certain how long the wound has been present. Timing: Pain in wound is Intermittent (comes and goes Context: The wound appeared gradually over time Associated Signs and Symptoms: Patient reports having difficulty standing for long periods. This 81 year old patient is a poor historian and comes from a nursing home.  Does not have any family members with her. Says she has a ulcer on the lateral part of her left foot and some new ones are there on her right foot too. She is unable to say how long she's had these. she does walk with a walker and this is limited most of the day. Does not recall if she has had any vascular studies done. I have reviewed an x-ray done of the left foot which basically shows osteoporosis but no evidence of fracture dislocation or osteomyelitis. 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 She seems to be doing better overall. 10/19/14 -- I understand she has gone to the vascular surgery and lab yesterday and has had all her tests done yesterday. She is doing fine otherwise and has had no fresh issues. 10/26/2014 she seems to be doing well and has no fresh problems and seems much brighter. 10/12/14 -- Taking her antibiotic and continues to do her dressings locally and she is helped by her skilled nursing. She seems to be doing fine and has no fresh issues. 10/19/14 -- she is doing fine and has had no fresh issues. She says her son to go for her vascular workup yesterday and she has been called back after 3 months. we will try and gather these reports  to review what [name], [name] ([id number]) they said. 10/26/14 --I have been able to review notes from the vascular surgery office and these were dated from 10/18/2014. He patient was on her first postop visit and had Doppler ultrasounds done of her arteries. She had more than 50% stenosis of the right superficial femoral artery and more than 50% stenosis in the left tibioperoneal trunk. She was status post a left lower extremity angiogram with angioplasty of the peroneal and left superficial femoral arteries for a nonhealing left foot and ankle ulceration. note is made of  the fact that the ABIs had not changed in the last month since her previous visit. The opinion of her provider was that she did not need any intervention at this time and they had done everything they could at the present time. They would continue on Plavix and antibiotics and see her back in 3 months for a another arterial duplex study of the lower extremities. 11/02/2014 -- she has spoken to her caregivers about coming for HBO 5 times per week for 6-8 weeks and they are agreeable about this and she is willing to undergo hyperbaric oxygen therapy. 11/16/2014 she has been worked up with a EKG and chest x-ray and these were within normal limits. As far as the investigations go she is ready for HBOT. we are awaiting some insurance clearance so that she can start on her hyperbaric oxygen therapy. 11/30/2014 she tolerated hyperbaric oxygen therapy fine and there were no issues with her blood glucose levels. As noted she is not a diabetic and most of her fingerstick blood glucose levels are inaccurate because of the Raynaud's phenomena. She normally has to have your lobe sticks to get any random glucose levels. I have also requested her primary care does a venous blood glucose draw to check her glucose level. 12/21/2014 -- she is tolerating hyperbaric oxygen very well and has overall made a lot of progress. Her left leg is looking much better. Objective Constitutional Pulse regular. Respirations normal and unlabored. Afebrile. Vitals Time Taken: 10:28 AM, Height: 64 in, Weight: 180 lbs, BMI: 30.9, Temperature: 96.8 F, Pulse: 66 bpm, Respiratory Rate: 18 breaths/min, Blood Pressure: 106/72 mmHg. Eyes Nonicteric. Reactive to light. Ears, Nose, Mouth, and Throat Lips, teeth, and gums WNL.Marland Kitchen Moist mucosa without lesions . Neck supple and nontender. No palpable supraclavicular or cervical adenopathy. Normal sized without goiter. [place], [name] ([id number]) Respiratory WNL. No  retractions.. Cardiovascular Pedal Pulses WNL. No clubbing, cyanosis or edema. Psychiatric Judgement and insight Intact.. No evidence of depression, anxiety, or agitation.. Integumentary (Hair, Skin) her left leg looks very good and minimal debris was there which was mainly fibrin. Right heel still has some eschar and necrotic debris and this will be sharply debrided.. No crepitus or fluctuance. No peri-wound warmth or erythema. No masses.. Wound #1 status is Open. Original cause of wound was Pressure Injury. The wound is located on the Left,Lateral Calcaneous. The wound measures 1cm length x 0.8cm width x 0.5cm depth; 0.628cm^2 area and 0.314cm^3 volume. The wound is limited to skin breakdown. There is no tunneling or undermining noted. There is a small amount of serous drainage noted. The wound margin is distinct with the outline attached to the wound base. There is medium (34-66%) pink granulation within the wound bed. There is a small (1-33%) amount of necrotic tissue within the wound bed including Adherent Slough. The periwound skin appearance exhibited: Localized Edema, Moist. The periwound skin appearance did not exhibit: Callus, Crepitus, Excoriation, Fluctuance, Friable, Induration,  Rash, Scarring, Dry/Scaly, Maceration, Atrophie Blanche, Cyanosis, Ecchymosis, Hemosiderin Staining, Mottled, Pallor, Rubor, Erythema. Periwound temperature was noted as No Abnormality. The periwound has tenderness on palpation. Wound #5 status is Open. Original cause of wound was Gradually Appeared. The wound is located on the Right Calcaneous. The wound measures 1.2cm length x 0.7cm width x 0.3cm depth; 0.66cm^2 area and 0.198cm^3 volume. The wound is limited to skin breakdown. There is no tunneling or undermining noted. There is a small amount of serosanguineous drainage noted. The wound margin is distinct with the outline attached to the wound base. There is medium (34-66%) pink granulation within the  wound bed. There is a medium (34-66%) amount of necrotic tissue within the wound bed including Adherent Slough. The periwound skin appearance exhibited: Localized Edema, Dry/Scaly, Moist, Hemosiderin Staining. The periwound skin appearance did not exhibit: Callus, Crepitus, Excoriation, Fluctuance, Friable, Induration, Rash, Scarring, Maceration, Atrophie Blanche, Cyanosis, Ecchymosis, Mottled, Pallor, Rubor, Erythema. Periwound temperature was noted as No Abnormality. The periwound has tenderness on palpation. Wound #6 status is Open. Original cause of wound was Gradually Appeared. The wound is located on the Left Achilles. The wound measures 0.8cm length x 0.7cm width x 0.1cm depth; 0.44cm^2 area and 0.044cm^3 volume. The wound is limited to skin breakdown. There is a small amount of serous drainage noted. The wound margin is distinct with the outline attached to the wound base. There is large (67-100%) pink granulation within the wound bed. There is a small (1-33%) amount of necrotic tissue within the wound bed including Adherent Slough. The periwound skin appearance exhibited: Localized Edema, Dry/Scaly, Moist, Hemosiderin Staining. The periwound skin appearance did not exhibit: Callus, Crepitus, Excoriation, Fluctuance, Friable, Induration, Rash, Scarring, Maceration, Atrophie Blanche, Cyanosis, Ecchymosis, Mottled, Pallor, Rubor, Erythema. Periwound temperature was noted as No Abnormality. The periwound has tenderness on palpation. [name], [name] ([id number]) Assessment Active Problems ICD-10 [phone] - Non-pressure chronic ulcer of left heel and midfoot with fat layer exposed I70.244 - Atherosclerosis of native arteries of left leg with ulceration of heel and midfoot I70.235 - Atherosclerosis of native arteries of right leg with ulceration of other part of foot L97.412 - Non-pressure chronic ulcer of right heel and midfoot with fat layer exposed We will continue with Prisma AG on the  left foot and some Santyl ointment on the right heel. She has the last 3 treatments of HBOT and once she completes this will continue with wound care. I will see her for her wound care visit next week. Procedures Wound #1 Wound #1 is a Diabetic Wound/Ulcer of the Lower Extremity located on the Left,Lateral Calcaneous . There was a Non-Viable Tissue Open Wound/Selective [phone]) debridement with total area of 0.8 sq cm performed by Daishaun Ayre, Jackson Latino., MD. with the following instrument(s): Forceps and to remove Non-Viable tissue/material including Fibrin/Slough and Eschar after achieving pain control using Lidocaine 4% Topical Solution. A time out was conducted prior to the start of the procedure. A Minimum amount of bleeding was controlled with Pressure. The procedure was tolerated well with a pain level of 0 throughout and a pain level of 0 following the procedure. Post Debridement Measurements: 1cm length x 0.8cm width x 0.5cm depth; 0.314cm^3 volume. Wound #5 Wound #5 is a Diabetic Wound/Ulcer of the Lower Extremity located on the Right Calcaneous . There was a Skin/Subcutaneous Tissue Debridement [id number]) debridement with total area of 0.84 sq cm performed by Jemila Camille, Jackson Latino., MD. with the following instrument(s): Forceps, gauze, and Scissors to remove Viable and Non-Viable tissue/material including  Fibrin/Slough and Subcutaneous after achieving pain control using Lidocaine 4% Topical Solution. A time out was conducted prior to the start of the procedure. A Minimum amount of bleeding was controlled with Pressure. The procedure was tolerated well with a pain level of 0 throughout and a pain level of 0 following the procedure. Post Debridement Measurements: 1.2cm length x 0.7cm width x 0.3cm depth; 0.198cm^3 volume. Wound #6 Wound #6 is a Diabetic Wound/Ulcer of the Lower Extremity located on the Left Achilles . There was a Non- Viable Tissue Open Wound/Selective [phone])  debridement with total area of 0.56 sq cm performed by Ivan Lacher, Jackson Latino., MD. with the following instrument(s): Forceps to remove Viable and Non-Viable [place], [name] ([id number]) tissue/material including Fibrin/Slough and Subcutaneous after achieving pain control using Lidocaine 4% Topical Solution. A time out was conducted prior to the start of the procedure. There was no bleeding. The procedure was tolerated well with a pain level of 0 throughout and a pain level of 0 following the procedure. Post Debridement Measurements: 0.8cm length x 0.7cm width x 0.2cm depth; 0.088cm^3 volume. Plan Wound Cleansing: Wound #1 Left,Lateral Calcaneous: Clean wound with Normal Saline. - be sure to clean out all remaining prisma Wound #5 Right Calcaneous: Clean wound with Normal Saline. - be sure to clean out all remaining prisma Wound #6 Left Achilles: Clean wound with Normal Saline. - be sure to clean out all remaining prisma Anesthetic: Wound #1 Left,Lateral Calcaneous: Topical Lidocaine 4% cream applied to wound bed prior to debridement Hurricaine Topical Anesthetic Spray applied to wound bed prior to debridement Wound #5 Right Calcaneous: Topical Lidocaine 4% cream applied to wound bed prior to debridement Hurricaine Topical Anesthetic Spray applied to wound bed prior to debridement Wound #6 Left Achilles: Topical Lidocaine 4% cream applied to wound bed prior to debridement Hurricaine Topical Anesthetic Spray applied to wound bed prior to debridement Primary Wound Dressing: Wound #1 Left,Lateral Calcaneous: Prisma Ag Wound #5 Right Calcaneous: Santyl Ointment Wound #6 Left Achilles: Prisma Ag Secondary Dressing: Wound #1 Left,Lateral Calcaneous: ABD and Kerlix/Conform Wound #5 Right Calcaneous: ABD and Kerlix/Conform Wound #6 Left Achilles: ABD and Kerlix/Conform Dressing Change Frequency: Wound #1 Left,Lateral Calcaneous: Change dressing every day. Wound #5 Right Calcaneous: Change  dressing every day. Wound #6 Left Achilles: Change dressing every day. Follow-up Appointments: [name], [name] ([id number]) Wound #1 Left,Lateral Calcaneous: Return Appointment in 1 week. Wound #5 Right Calcaneous: Return Appointment in 1 week. Wound #6 Left Achilles: Return Appointment in 1 week. Off-Loading: Wound #1 Left,Lateral Calcaneous: Other: - SAGE boots while sitting or lying. Do not wear while ambulating. Wound #5 Right Calcaneous: Other: - SAGE boots while sitting or lying. Do not wear while ambulating. Wound #6 Left Achilles: Other: - SAGE boots while sitting or lying. Do not wear while ambulating. Home Health: Wound #1 Left,Lateral Calcaneous: Continue Home Health Visits - [facility] Nurse may visit PRN to address patient s wound care needs. FACE TO FACE ENCOUNTER: MEDICARE and MEDICAID PATIENTS: I certify that this patient is under my care and that I had a face-to-face encounter that meets the physician face-to-face encounter requirements with this patient on this date. The encounter with the patient was in whole or in part for the following MEDICAL CONDITION: (primary reason for [place]) MEDICAL NECESSITY: I certify, that based on my findings, NURSING services are a medically necessary home health service. HOME BOUND STATUS: I certify that my clinical findings support that this patient is homebound (i.e., Due to illness or injury,  pt requires aid of supportive devices such as crutches, cane, wheelchairs, walkers, the use of special transportation or the assistance of another person to leave their place of residence. There is a normal inability to leave the home and doing so requires considerable and taxing effort. Other absences are for medical reasons / religious services and are infrequent or of short duration when for other reasons). If current dressing causes regression in wound condition, may D/C ordered dressing product/s and apply Normal Saline  Moist Dressing daily until next [name] / Other MD appointment. [place] of regression in wound condition at [phone]. Please direct any NON-WOUND related issues/requests for orders to patient's Primary Care Physician Wound #5 Right Calcaneous: [place] Visits - [facility] Nurse may visit PRN to address patient s wound care needs. FACE TO FACE ENCOUNTER: MEDICARE and MEDICAID PATIENTS: I certify that this patient is under my care and that I had a face-to-face encounter that meets the physician face-to-face encounter requirements with this patient on this date. The encounter with the patient was in whole or in part for the following MEDICAL CONDITION: (primary reason for [place]) MEDICAL NECESSITY: I certify, that based on my findings, NURSING services are a medically necessary home health service. HOME BOUND STATUS: I certify that my clinical findings support that this patient is homebound (i.e., Due to illness or injury, pt requires aid of supportive devices such as crutches, cane, wheelchairs, walkers, the use of special transportation or the assistance of another person to leave their place of residence. There is a normal inability to leave the home and doing so requires considerable and taxing effort. Other absences are for medical reasons / religious services and are infrequent or of short duration when for other reasons). If current dressing causes regression in wound condition, may D/C ordered dressing product/s and apply Normal Saline Moist Dressing daily until next [place] / Other MD appointment. [place] of regression in wound condition at [phone]. Please direct any NON-WOUND related issues/requests for orders to patient's Primary Care Physician Wound #6 Left Achilles: [name] Visits - [facility] Nurse may visit PRN to address patient s wound care  needs. [name], [name] ([id number]) FACE TO FACE ENCOUNTER: MEDICARE and MEDICAID PATIENTS: I certify that this patient is under my care and that I had a face-to-face encounter that meets the physician face-to-face encounter requirements with this patient on this date. The encounter with the patient was in whole or in part for the following MEDICAL CONDITION: (primary reason for [place]) MEDICAL NECESSITY: I certify, that based on my findings, NURSING services are a medically necessary home health service. HOME BOUND STATUS: I certify that my clinical findings support that this patient is homebound (i.e., Due to illness or injury, pt requires aid of supportive devices such as crutches, cane, wheelchairs, walkers, the use of special transportation or the assistance of another person to leave their place of residence. There is a normal inability to leave the home and doing so requires considerable and taxing effort. Other absences are for medical reasons / religious services and are infrequent or of short duration when for other reasons). If current dressing causes regression in wound condition, may D/C ordered dressing product/s and apply Normal Saline Moist Dressing daily until next [place] / Other MD appointment. [place] of regression in wound condition at [phone]. Please direct any NON-WOUND related issues/requests for orders to patient's Primary  Care Physician Hyperbaric Oxygen Therapy: Wound #1 Left,Lateral Calcaneous: Indication: - acute arterial insufficiency If appropriate for treatment, begin HBOT per protocol: 2.0 ATA for 90 Minutes without Air Breaks One treatment per day (delivered Monday through Friday unless otherwise specified in Special Instructions below): Total # of Treatments: - 30 Wound #5 Right Calcaneous: Indication: - acute arterial insufficiency If appropriate for treatment, begin HBOT per protocol: 2.0 ATA for 90 Minutes  without Air Breaks One treatment per day (delivered Monday through Friday unless otherwise specified in Special Instructions below): Total # of Treatments: - 30 Wound #6 Left Achilles: Indication: - acute arterial insufficiency If appropriate for treatment, begin HBOT per protocol: 2.0 ATA for 90 Minutes without Air Breaks One treatment per day (delivered Monday through Friday unless otherwise specified in Special Instructions below): Total # of Treatments: - 30 HBO Contraindications: Wound #1 Left,Lateral Calcaneous: HBO contraindications of hyperbaric oxygen therapy were reviewed and the patient found to have no untreated pneumothorax or history of spontaneous pneumothorax. HBO contraindications of hyperbaric oxygen therapy were reviewed and the patient found to have no history of medications such as Bleomycin, Adriamycin, disulfiram, cisplatin and sulfamylon and is not currently receiving any chemotherapy. HBO contraindications of hyperbaric oxygen therapy were reviewed and the patient found to have no Upper respiratory infection and chronic sinusitis. HBO contraindications of hyperbaric oxygen therapy were reviewed and the patient found to have no history of retinal surgery proceeding 6 weeks or intraocular gas HBO contraindications of hyperbaric oxygen therapy were reviewed and the patient found to have no history seizure disorder or any anticonvulsant medication. HBO contraindications of hyperbaric oxygen therapy were reviewed and the patient found to have no septicemia with CO2 retention. [name], [name] ([id number]) HBO contraindications of hyperbaric oxygen therapy were reviewed and the patient found to have no fever greater than 100 degrees. HBO contraindications of hyperbaric oxygen therapy were reviewed and the patient found to have no pregnancy noted. HBO contraindications of hyperbaric oxygen therapy were reviewed and the patient found to have no medications such as  steroids or narcotics or Phenergan. Wound #5 Right Calcaneous: HBO contraindications of hyperbaric oxygen therapy were reviewed and the patient found to have no untreated pneumothorax or history of spontaneous pneumothorax. HBO contraindications of hyperbaric oxygen therapy were reviewed and the patient found to have no history of medications such as Bleomycin, Adriamycin, disulfiram, cisplatin and sulfamylon and is not currently receiving any chemotherapy. HBO contraindications of hyperbaric oxygen therapy were reviewed and the patient found to have no Upper respiratory infection and chronic sinusitis. HBO contraindications of hyperbaric oxygen therapy were reviewed and the patient found to have no history of retinal surgery proceeding 6 weeks or intraocular gas HBO contraindications of hyperbaric oxygen therapy were reviewed and the patient found to have no history seizure disorder or any anticonvulsant medication. HBO contraindications of hyperbaric oxygen therapy were reviewed and the patient found to have no septicemia with CO2 retention. HBO contraindications of hyperbaric oxygen therapy were reviewed and the patient found to have no fever greater than 100 degrees. HBO contraindications of hyperbaric oxygen therapy were reviewed and the patient found to have no pregnancy noted. HBO contraindications of hyperbaric oxygen therapy were reviewed and the patient found to have no medications such as steroids or narcotics or Phenergan. Wound #6 Left Achilles: HBO contraindications of hyperbaric oxygen therapy were reviewed and the patient found to have no untreated pneumothorax or history of spontaneous pneumothorax. HBO contraindications of hyperbaric oxygen therapy were reviewed and the patient found  to have no history of medications such as Bleomycin, Adriamycin, disulfiram, cisplatin and sulfamylon and is not currently receiving any chemotherapy. HBO contraindications of hyperbaric  oxygen therapy were reviewed and the patient found to have no Upper respiratory infection and chronic sinusitis. HBO contraindications of hyperbaric oxygen therapy were reviewed and the patient found to have no history of retinal surgery proceeding 6 weeks or intraocular gas HBO contraindications of hyperbaric oxygen therapy were reviewed and the patient found to have no history seizure disorder or any anticonvulsant medication. HBO contraindications of hyperbaric oxygen therapy were reviewed and the patient found to have no septicemia with CO2 retention. HBO contraindications of hyperbaric oxygen therapy were reviewed and the patient found to have no fever greater than 100 degrees. HBO contraindications of hyperbaric oxygen therapy were reviewed and the patient found to have no pregnancy noted. HBO contraindications of hyperbaric oxygen therapy were reviewed and the patient found to have no medications such as steroids or narcotics or Phenergan. Medications-please add to medication list.: Wound #5 Right Calcaneous: Santyl Enzymatic Ointment [name], [name] ([id number]) We will continue with Prisma AG on the left foot and some Santyl ointment on the right heel. She has the last 3 treatments of HBOT and once she completes this will continue with wound care. I will see her for her wound care visit next week. Electronic Signature(s) Signed: 01/04/2015 12:32:56 PM By: Christin Fudge MD, FACS Entered By: Christin Fudge on 01/04/2015 11:07:31 [name] ([id number]) -------------------------------------------------------------------------------- SuperBill Details Patient Name: [name] Date of Service: 01/04/2015 Medical Record Number: [id number] Patient Account Number: [id number] Date of Birth/Sex: [birth year] (81 y.o. 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 arteries of right leg with ulceration of other part of foot 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 Facility Procedures CPT4: Description Modifier Quantity Code [id number] - DEB SUBQ TISSUE 20 SQ CM/< 1 ICD-10 Description Diagnosis 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 leg  with ulceration of heel and midfoot 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 CPT4: [id number] - DEBRIDE WOUND 1ST 20 SQ CM OR < 1 ICD-10 Description Diagnosis 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 of heel and  midfoot 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

## 2015-01-05 NOTE — Progress Notes (Signed)
[name], [name] ([id number]) Visit Report for 01/04/2015 Arrival Information Details Patient Name: [name], [name] Date of Service: 01/04/2015 10:15 AM Medical Record Number: [id number] Patient Account Number: [id number] Date of Birth/Sex: 10-14-1927 (81 y.o. Female) Treating RN: Afful, RN, BSN, Velva Harman Primary Care Physician: Constance Goltz Other Clinician: Referring Physician: Constance Goltz Treating Physician/Extender: Frann Rider in Treatment: 16 Visit Information History Since Last Visit Any new allergies or adverse reactions: No Patient Arrived: Wheel Chair Had a fall or experienced change in No Arrival Time: 10:28 activities of daily living that may affect Accompanied By: self risk of falls: Transfer Assistance: None Signs or symptoms of abuse/neglect since last No Patient Identification Verified: Yes visito Secondary Verification Process Yes Hospitalized since last visit: No Completed: Has Dressing in Place as Prescribed: Yes Patient Requires Transmission- No Pain Present Now: No Based Precautions: Patient Has Alerts: Yes Patient Alerts: Patient on Blood Thinner DMII Plavix ABI L 0.63 R 0.79 Electronic Signature(s) Signed: 01/04/2015 3:59:47 PM By: Regan Lemming BSN, RN Entered By: Regan Lemming on 01/04/2015 10:28:53 [name] ([id number]) -------------------------------------------------------------------------------- Encounter Discharge Information Details Patient Name: [name] Date of Service: 01/04/2015 10:15 AM Medical Record Number: [id number] Patient Account Number: [id number] Date of Birth/Sex: 1927-12-01 (81 y.o. Female) Treating RN: Baruch Gouty, RN, BSN, Velva Harman Primary Care Physician: Constance Goltz Other Clinician: Referring Physician: Constance Goltz Treating Physician/Extender: Frann Rider in Treatment: 16 Encounter Discharge Information Items Discharge Pain Level: 0 Discharge Condition: Stable Ambulatory Status: [place] Discharge  Destination: Home Private Transportation: Auto Accompanied By: self Schedule Follow-up Appointment: No Medication Reconciliation completed and No provided to Patient/Care Quantae Martel: Patient Clinical Summary of Care: Declined Electronic Signature(s) Signed: 01/04/2015 1:11:48 PM By: Ruthine Dose Entered By: Ruthine Dose on 01/04/2015 13:11:48 [name] ([id number]) -------------------------------------------------------------------------------- Lower Extremity Assessment Details Patient Name: [name] Date of Service: 01/04/2015 10:15 AM Medical Record Number: [id number] Patient Account Number: [id number] Date of Birth/Sex: 06-25-1928 (81 y.o. Female) Treating RN: Afful, RN, BSN, Velva Harman Primary Care Physician: Constance Goltz Other Clinician: Referring Physician: Constance Goltz Treating Physician/Extender: Frann Rider in Treatment: 16 Edema Assessment Assessed: [Left: No] [Right: No] E[Left: dema] [Right: :] Calf Left: Right: Point of Measurement: 33 cm From Medial Instep cm cm Ankle Left: Right: Point of Measurement: 11 cm From Medial Instep cm cm Vascular Assessment Claudication: Claudication Assessment [Left:None] [Right:None] Pulses: Posterior Tibial Dorsalis Pedis Palpable: [Left:Yes] [Right:Yes] Extremity colors, hair growth, and conditions: Extremity Color: [Left:Dusky] [Right:Dusky] Hair Growth on Extremity: [Left:No] [Right:No] Temperature of Extremity: [Left:Warm] [Right:Warm] Capillary Refill: [Left:< 3 seconds] Dependent Rubor: [Left:No] [Right:No] Blanched when Elevated: [Left:No] [Right:No] Lipodermatosclerosis: [Left:No] Toe Nail Assessment Left: Right: Thick: Yes Yes Discolored: No No Deformed: No No Improper Length and Hygiene: No [name] ([id number]) Electronic Signature(s) Signed: 01/04/2015 3:59:47 PM By: Regan Lemming BSN, RN Entered By: Regan Lemming on 01/04/2015 10:33:09 [name]  ([id number]) -------------------------------------------------------------------------------- Multi Wound Chart Details Patient Name: [name] Date of Service: 01/04/2015 10:15 AM Medical Record Number: [id number] Patient Account Number: [id number] Date of Birth/Sex: [birth year] (81 y.o. Female) Treating RN: Montey Hora Primary Care Physician: Constance Goltz Other Clinician: Referring Physician: Constance Goltz Treating Physician/Extender: Frann Rider in Treatment: 16 Vital Signs Height(in): 64 Pulse(bpm): 66 Weight(lbs): 180 Blood Pressure 106/72 (mmHg): Body Mass Index(BMI): 31 Temperature(F): 96.8 Respiratory Rate 18 (breaths/min): Photos: [1:No Photos] [5:No Photos] [6:No Photos] Wound Location: [1:Left Calcaneous - Lateral] [5:Right Calcaneous] [6:Left Achilles] Wounding Event: [1:Pressure Injury] [5:Gradually Appeared] [6:Gradually Appeared] Primary Etiology: [1:Diabetic Wound/Ulcer of the Lower Extremity] [5:Diabetic Wound/Ulcer of the  Lower Extremity] [6:Diabetic Wound/Ulcer of the Lower Extremity] Secondary Etiology: [1:Pressure Ulcer] [5:N/A] [6:N/A] Comorbid History: [1:Glaucoma, Asthma, Hypertension, Peripheral Venous Disease, Type II Diabetes] [5:Glaucoma, Asthma, Hypertension, Peripheral Venous Disease, Type II Diabetes] [6:Glaucoma, Asthma, Hypertension, Peripheral Venous Disease, Type II  Diabetes] Date Acquired: [1:07/24/2014] [5:09/26/2014] [6:09/26/2014] Weeks of Treatment: [1:16] [5:13] [6:13] Wound Status: [1:Open] [5:Open] [6:Open] Measurements L x W x D 1x0.8x0.5 [5:1.2x0.7x0.3] [6:0.8x0.7x0.1] (cm) Area (cm) : [1:0.628] [5:0.66] [6:0.44] Volume (cm) : [1:0.314] [5:0.198] [6:0.044] % Reduction in Area: [1:87.70%] [5:88.00%] [6:88.30%] % Reduction in Volume: 84.60% [5:64.00%] [6:97.70%] Classification: [1:Grade 1] [5:Grade 2] [6:Grade 1] Exudate Amount: [1:Small] [5:Small] [6:Small] Exudate Type: [1:Serous] [5:Serosanguineous]  [6:Serous] Exudate Color: [1:amber] [5:red, brown] [6:amber] Wound Margin: [1:Distinct, outline attached] [5:Distinct, outline attached] [6:Distinct, outline attached] Granulation Amount: [1:Medium (34-66%)] [5:Medium (34-66%)] [6:Large (67-100%)] Granulation Quality: [1:Pink] [5:Pink] [6:Pink] Necrotic Amount: [1:Small (1-33%)] [5:Medium (34-66%)] [6:Small (1-33%)] Exposed Structures: [1:Fascia: No Fat: No Tendon: No] [5:Fascia: No Fat: No Tendon: No] [6:Fascia: No Fat: No Tendon: No] Muscle: No Muscle: No Muscle: No Joint: No Joint: No Joint: No Bone: No Bone: No Bone: No Limited to Skin Limited to Skin Limited to Skin Breakdown Breakdown Breakdown Epithelialization: Medium (34-66%) Medium (34-66%) Medium (34-66%) Periwound Skin Texture: Edema: Yes Edema: Yes Edema: Yes Excoriation: No Excoriation: No Excoriation: No Induration: No Induration: No Induration: No Callus: No Callus: No Callus: No Crepitus: No Crepitus: No Crepitus: No Fluctuance: No Fluctuance: No Fluctuance: No Friable: No Friable: No Friable: No Rash: No Rash: No Rash: No Scarring: No Scarring: No Scarring: No Periwound Skin Moist: Yes Moist: Yes Moist: Yes Moisture: Maceration: No Dry/Scaly: Yes Dry/Scaly: Yes Dry/Scaly: No Maceration: No Maceration: No Periwound Skin Color: Atrophie Blanche: No Hemosiderin Staining: Yes Hemosiderin Staining: Yes Cyanosis: No Atrophie Blanche: No Atrophie Blanche: No Ecchymosis: No Cyanosis: No Cyanosis: No Erythema: No Ecchymosis: No Ecchymosis: No Hemosiderin Staining: No Erythema: No Erythema: No Mottled: No Mottled: No Mottled: No Pallor: No Pallor: No Pallor: No Rubor: No Rubor: No Rubor: No Temperature: No Abnormality No Abnormality No Abnormality Tenderness on Yes Yes Yes Palpation: Wound Preparation: Ulcer Cleansing: Ulcer Cleansing: Ulcer Cleansing: Rinsed/Irrigated with Rinsed/Irrigated with Rinsed/Irrigated with Saline  Saline Saline Topical Anesthetic Topical Anesthetic Topical Anesthetic Applied: Other: Lidocaine Applied: Other: Lidocaine Applied: Other: lidocaine 4% Cream 4% Ointment 4% Treatment Notes Electronic Signature(s) Signed: 01/04/2015 4:34:18 PM By: Montey Hora Entered By: Montey Hora on 01/04/2015 10:52:26 [name] ([id number]) -------------------------------------------------------------------------------- Multi-Disciplinary Care Plan Details Patient Name: [name] Date of Service: 01/04/2015 10:15 AM Medical Record Number: [id number] Patient Account Number: [id number] Date of Birth/Sex: 02-07-1928 (81 y.o. Female) Treating RN: Montey Hora Primary Care Physician: Constance Goltz Other Clinician: Referring Physician: Constance Goltz Treating Physician/Extender: Frann Rider in Treatment: 53 Active Inactive Abuse / Safety / Falls / Self Care Management Nursing Diagnoses: Impaired physical mobility Potential for falls Goals: Patient will remain injury free Date Initiated: 09/12/2014 Goal Status: Active Patient/caregiver will verbalize understanding of skin care regimen Date Initiated: 09/12/2014 Goal Status: Active Patient/caregiver will verbalize/demonstrate measures taken to prevent injury and/or falls Date Initiated: 09/12/2014 Goal Status: Active Patient/caregiver will verbalize/demonstrate understanding of what to do in case of emergency Date Initiated: 09/12/2014 Goal Status: Active Interventions: Assess fall risk on admission and as needed Assess: immobility, friction, shearing, incontinence upon admission and as needed Assess impairment of mobility on admission and as needed per policy Provide education on basic hygiene Provide education on fall prevention Provide education on personal and home safety Provide education on safe transfers  Treatment Activities: Education provided on Basic Hygiene : 10/12/2014 Notes: Necrotic Tissue JANUARI, JANOS  ([id number]) Nursing Diagnoses: Impaired tissue integrity related to necrotic/devitalized tissue Knowledge deficit related to management of necrotic/devitalized tissue Goals: Necrotic/devitalized tissue will be minimized in the wound bed Date Initiated: 09/12/2014 Goal Status: Active Patient/caregiver will verbalize understanding of reason and process for debridement of necrotic tissue Date Initiated: 09/12/2014 Goal Status: Active Interventions: Assess patient pain level pre-, during and post procedure and prior to discharge Provide education on necrotic tissue and debridement process Treatment Activities: Apply topical anesthetic as ordered : 01/04/2015 Enzymatic debridement : 01/04/2015 Excisional debridement : 01/04/2015 Notes: Orientation to the Wound Care Program Nursing Diagnoses: Knowledge deficit related to the wound healing center program Goals: Patient/caregiver will verbalize understanding of the [place] Program Date Initiated: 09/12/2014 Goal Status: Active Interventions: Provide education on orientation to the wound center Notes: Pressure Nursing Diagnoses: Knowledge deficit related to causes and risk factors for pressure ulcer development Knowledge deficit related to management of pressures ulcers Potential for impaired tissue integrity related to pressure, friction, moisture, and shear GoalsSUZET, RIEDLINGER ([id number]) Patient will remain free from development of additional pressure ulcers Date Initiated: 09/12/2014 Goal Status: Active Patient will remain free of pressure ulcers Date Initiated: 09/12/2014 Goal Status: Active Patient/caregiver will verbalize risk factors for pressure ulcer development Date Initiated: 09/12/2014 Goal Status: Active Patient/caregiver will verbalize understanding of pressure ulcer management Date Initiated: 09/12/2014 Goal Status: Active Interventions: Assess: immobility, friction, shearing, incontinence upon admission  and as needed Assess offloading mechanisms upon admission and as needed Assess potential for pressure ulcer upon admission and as needed Provide education on pressure ulcers Treatment Activities: Pressure reduction/relief device ordered : 01/04/2015 Notes: Wound/Skin Impairment Nursing Diagnoses: Impaired tissue integrity Knowledge deficit related to ulceration/compromised skin integrity Goals: Patient/caregiver will verbalize understanding of skin care regimen Date Initiated: 09/12/2014 Goal Status: Active Ulcer/skin breakdown will heal within 14 weeks Date Initiated: 09/12/2014 Goal Status: Active Interventions: Assess patient/caregiver ability to obtain necessary supplies Assess patient/caregiver ability to perform ulcer/skin care regimen upon admission and as needed Assess ulceration(s) every visit Provide education on ulcer and skin care Treatment Activities: Skin care regimen initiated : 01/04/2015 ANGIA, WHEELESS ([id number]) Topical wound management initiated : 01/04/2015 Notes: Electronic Signature(s) Signed: 01/04/2015 4:34:18 PM By: Montey Hora Entered By: Montey Hora on 01/04/2015 10:52:15 [name] ([id number]) -------------------------------------------------------------------------------- Pain Assessment Details Patient Name: [name] Date of Service: 01/04/2015 10:15 AM Medical Record Number: [id number] Patient Account Number: [id number] Date of Birth/Sex: 1927-09-10 (81 y.o. Female) Treating RN: Baruch Gouty, RN, BSN, Velva Harman Primary Care Physician: Constance Goltz Other Clinician: Referring Physician: Constance Goltz Treating Physician/Extender: Frann Rider in Treatment: 16 Active Problems Location of Pain Severity and Description of Pain Patient Has Paino No Site Locations Pain Management and Medication Current Pain Management: Electronic Signature(s) Signed: 01/04/2015 3:59:47 PM By: Regan Lemming BSN, RN Entered By: Regan Lemming on 01/04/2015  10:29:04 [name] ([id number]) -------------------------------------------------------------------------------- Patient/Caregiver Education Details Patient Name: [name] Date of Service: 01/04/2015 10:15 AM Medical Record Number: [id number] Patient Account Number: [id number] Date of Birth/Gender: 14-Feb-1928 (81 y.o. Female) Treating RN: Baruch Gouty, RN, BSN, Velva Harman Primary Care Physician: Constance Goltz Other Clinician: Referring Physician: Constance Goltz Treating Physician/Extender: Frann Rider in Treatment: 16 Education Assessment Education Provided To: Patient Education Topics Provided Basic Hygiene: Methods: Explain/Verbal Responses: State content correctly Wound/Skin Impairment: Methods: Explain/Verbal Responses: State content correctly Electronic Signature(s) Signed: 01/04/2015 3:59:47 PM By: Regan Lemming BSN, RN Entered By: Regan Lemming on 01/04/2015 12:30:04  AZHIA, FARNSWORTH ([id number]) -------------------------------------------------------------------------------- Wound Assessment Details Patient Name: JOLEAH, YEN Date of Service: 01/04/2015 10:15 AM Medical Record Number: [id number] Patient Account Number: [id number] Date of Birth/Sex: 09-Jan-1928 (81 y.o. Female) Treating RN: Afful, RN, BSN, Velva Harman Primary Care Physician: Constance Goltz Other Clinician: Referring Physician: Constance Goltz Treating Physician/Extender: Frann Rider in Treatment: 16 Wound Status Wound Number: 1 Primary Diabetic Wound/Ulcer of the Lower Etiology: Extremity Wound Location: Left Calcaneous - Lateral Secondary Pressure Ulcer Wounding Event: Pressure Injury Etiology: Date Acquired: 07/24/2014 Wound Open Weeks Of Treatment: 16 Status: Clustered Wound: No Comorbid Glaucoma, Asthma, Hypertension, History: Peripheral Venous Disease, Type II Diabetes Photos Photo Uploaded By: Regan Lemming on 01/04/2015 13:59:50 Wound Measurements Length: (cm) 1 Width: (cm) 0.8 Depth:  (cm) 0.5 Area: (cm) 0.628 Volume: (cm) 0.314 % Reduction in Area: 87.7% % Reduction in Volume: 84.6% Epithelialization: Medium (34-66%) Tunneling: No Undermining: No Wound Description Classification: Grade 1 Wound Margin: Distinct, outline attached Exudate Amount: Small Exudate Type: Serous Exudate Color: amber Foul Odor After Cleansing: No Wound Bed Granulation Amount: Medium (34-66%) Exposed Structure Granulation Quality: Pink Fascia Exposed: No [place], [place] ([id number]) Necrotic Amount: Small (1-33%) Fat Layer Exposed: No Necrotic Quality: Adherent Slough Tendon Exposed: No Muscle Exposed: No Joint Exposed: No Bone Exposed: No Limited to Skin Breakdown Periwound Skin Texture Texture Color No Abnormalities Noted: No No Abnormalities Noted: No Callus: No Atrophie Blanche: No Crepitus: No Cyanosis: No Excoriation: No Ecchymosis: No Fluctuance: No Erythema: No Friable: No Hemosiderin Staining: No Induration: No Mottled: No Localized Edema: Yes Pallor: No Rash: No Rubor: No Scarring: No Temperature / Pain Moisture Temperature: No Abnormality No Abnormalities Noted: No Tenderness on Palpation: Yes Dry / Scaly: No Maceration: No Moist: Yes Wound Preparation Ulcer Cleansing: Rinsed/Irrigated with Saline Topical Anesthetic Applied: Other: Lidocaine 4% Cream, Treatment Notes Wound #1 (Left, Lateral Calcaneous) 1. Cleansed with: Clean wound with Normal Saline 4. Dressing Applied: Prisma Ag 5. Secondary Dressing Applied Guaze, ABD and kerlix/Conform 7. Secured with Recruitment consultant) Signed: 01/04/2015 3:59:47 PM By: Regan Lemming BSN, RN Entered By: Regan Lemming on 01/04/2015 10:38:16 [name] ([id number]) -------------------------------------------------------------------------------- Wound Assessment Details Patient Name: [name] Date of Service: 01/04/2015 10:15 AM Medical Record Number: [id number] Patient Account  Number: [id number] Date of Birth/Sex: 13-Jun-1928 (81 y.o. Female) Treating RN: Afful, RN, BSN, Velva Harman Primary Care Physician: Constance Goltz Other Clinician: Referring Physician: Constance Goltz Treating Physician/Extender: Frann Rider in Treatment: 16 Wound Status Wound Number: 5 Primary Diabetic Wound/Ulcer of the Lower Etiology: Extremity Wound Location: Right Calcaneous Wound Open Wounding Event: Gradually Appeared Status: Date Acquired: 09/26/2014 Comorbid Glaucoma, Asthma, Hypertension, Weeks Of Treatment: 13 History: Peripheral Venous Disease, Type II Clustered Wound: No Diabetes Photos Photo Uploaded By: Regan Lemming on 01/04/2015 13:59:50 Wound Measurements Length: (cm) 1.2 Width: (cm) 0.7 Depth: (cm) 0.3 Area: (cm) 0.66 Volume: (cm) 0.198 % Reduction in Area: 88% % Reduction in Volume: 64% Epithelialization: Medium (34-66%) Tunneling: No Undermining: No Wound Description Classification: Grade 2 Wound Margin: Distinct, outline attached Exudate Amount: Small Exudate Type: Serosanguineous Exudate Color: red, brown Foul Odor After Cleansing: No Wound Bed Granulation Amount: Medium (34-66%) Exposed Structure Granulation Quality: Pink Fascia Exposed: No Necrotic Amount: Medium (34-66%) Fat Layer Exposed: No Necrotic Quality: Adherent Slough Tendon Exposed: No [place], [place] ([id number]) Muscle Exposed: No Joint Exposed: No Bone Exposed: No Limited to Skin Breakdown Periwound Skin Texture Texture Color No Abnormalities Noted: No No Abnormalities Noted: No Callus: No Atrophie Blanche: No Crepitus: No Cyanosis: No Excoriation: No Ecchymosis: No  Fluctuance: No Erythema: No Friable: No Hemosiderin Staining: Yes Induration: No Mottled: No Localized Edema: Yes Pallor: No Rash: No Rubor: No Scarring: No Temperature / Pain Moisture Temperature: No Abnormality No Abnormalities Noted: No Tenderness on Palpation: Yes Dry / Scaly: Yes Maceration:  No Moist: Yes Wound Preparation Ulcer Cleansing: Rinsed/Irrigated with Saline Topical Anesthetic Applied: Other: Lidocaine 4% Ointment, Treatment Notes Wound #5 (Right Calcaneous) 1. Cleansed with: Clean wound with Normal Saline 4. Dressing Applied: Santyl Ointment 5. Secondary Dressing Applied Guaze, ABD and kerlix/Conform 7. Secured with Tape Notes Sage boot at [facility] [employer]) Signed: 01/04/2015 3:59:47 PM By: Regan Lemming BSN, RN Entered By: Regan Lemming on 01/04/2015 10:38:41 [name] ([id number]) -------------------------------------------------------------------------------- Wound Assessment Details Patient Name: [name] Date of Service: 01/04/2015 10:15 AM Medical Record Number: [id number] Patient Account Number: [id number] Date of Birth/Sex: June 30, 1928 (81 y.o. Female) Treating RN: Afful, RN, BSN, Velva Harman Primary Care Physician: Constance Goltz Other Clinician: Referring Physician: Constance Goltz Treating Physician/Extender: Frann Rider in Treatment: 16 Wound Status Wound Number: 6 Primary Diabetic Wound/Ulcer of the Lower Etiology: Extremity Wound Location: Left Achilles Wound Open Wounding Event: Gradually Appeared Status: Date Acquired: 09/26/2014 Comorbid Glaucoma, Asthma, Hypertension, Weeks Of Treatment: 13 History: Peripheral Venous Disease, Type II Clustered Wound: No Diabetes Photos Photo Uploaded By: Regan Lemming on 01/04/2015 14:00:05 Wound Measurements Length: (cm) 0.8 Width: (cm) 0.7 Depth: (cm) 0.1 Area: (cm) 0.44 Volume: (cm) 0.044 % Reduction in Area: 88.3% % Reduction in Volume: 97.7% Epithelialization: Medium (34-66%) Wound Description Classification: Grade 1 Wound Margin: Distinct, outline attached Exudate Amount: Small Exudate Type: Serous Exudate Color: amber Foul Odor After Cleansing: No Wound Bed Granulation Amount: Large (67-100%) Exposed Structure Granulation Quality: Pink Fascia Exposed:  No Necrotic Amount: Small (1-33%) Fat Layer Exposed: No Necrotic Quality: Adherent Slough Tendon Exposed: No [place], [place] ([id number]) Muscle Exposed: No Joint Exposed: No Bone Exposed: No Limited to Skin Breakdown Periwound Skin Texture Texture Color No Abnormalities Noted: No No Abnormalities Noted: No Callus: No Atrophie Blanche: No Crepitus: No Cyanosis: No Excoriation: No Ecchymosis: No Fluctuance: No Erythema: No Friable: No Hemosiderin Staining: Yes Induration: No Mottled: No Localized Edema: Yes Pallor: No Rash: No Rubor: No Scarring: No Temperature / Pain Moisture Temperature: No Abnormality No Abnormalities Noted: No Tenderness on Palpation: Yes Dry / Scaly: Yes Maceration: No Moist: Yes Wound Preparation Ulcer Cleansing: Rinsed/Irrigated with Saline Topical Anesthetic Applied: Other: lidocaine 4%, Treatment Notes Wound #6 (Left Achilles) 1. Cleansed with: Clean wound with Normal Saline 4. Dressing Applied: Prisma Ag 5. Secondary Dressing Applied Guaze, ABD and kerlix/Conform 7. Secured with Recruitment consultant) Signed: 01/04/2015 3:59:47 PM By: Regan Lemming BSN, RN Entered By: Regan Lemming on 01/04/2015 10:39:06 [name] ([id number]) -------------------------------------------------------------------------------- Vitals Details Patient Name: [name] Date of Service: 01/04/2015 10:15 AM Medical Record Number: [id number] Patient Account Number: [id number] Date of Birth/Sex: 09/26/1927 (81 y.o. Female) Treating RN: Afful, RN, BSN, Velva Harman Primary Care Physician: Constance Goltz Other Clinician: Referring Physician: Constance Goltz Treating Physician/Extender: Frann Rider in Treatment: 16 Vital Signs Time Taken: 10:28 Temperature (F): 96.8 Height (in): 64 Pulse (bpm): 66 Weight (lbs): 180 Respiratory Rate (breaths/min): 18 Body Mass Index (BMI): 30.9 Blood Pressure (mmHg): 106/72 Reference Range: 80 - 120 mg /  dl Electronic Signature(s) Signed: 01/04/2015 11:00:10 AM By: Lorine Bears RCP, RRT, CHT Entered By: Lorine Bears on 01/04/2015 10:50:32

## 2015-01-08 ENCOUNTER — Encounter: Payer: Medicaid Other | Admitting: Surgery

## 2015-01-08 DIAGNOSIS — L97529 Non-pressure chronic ulcer of other part of left foot with unspecified severity: Secondary | ICD-10-CM | POA: Diagnosis not present

## 2015-01-08 NOTE — Progress Notes (Signed)
[name] ([id number]) Visit Report for 01/08/2015 Arrival Information Details Patient Name: [name] Date of Service: 01/08/2015 1:00 PM Medical Record Number: [id number] Patient Account Number: [id number] Date of Birth/Sex: 1928-05-30 (81 y.o. Female) Treating RN: Primary Care Physician: Constance Goltz Other Clinician: Jacqulyn Bath Referring Physician: Constance Goltz Treating Physician/Extender: Frann Rider in Treatment: 16 Visit Information History Since Last Visit Added or deleted any medications: No Patient Arrived: Wheel Chair Any new allergies or adverse reactions: No Arrival Time: 13:26 Had a fall or experienced change in No Accompanied By: Delfino Lovett, activities of daily living that may affect transport risk of falls: Transfer Assistance: Manual Signs or symptoms of abuse/neglect since last No Patient Identification Verified: Yes visito Secondary Verification Process Yes Hospitalized since last visit: No Completed: Has Dressing in Place as Prescribed: Yes Patient Requires Transmission- No Pain Present Now: No Based Precautions: Patient Has Alerts: Yes Patient Alerts: Patient on Blood Thinner DMII Plavix ABI L 0.63 R 0.79 Electronic Signature(s) Signed: 01/08/2015 4:01:52 PM By: Lorine Bears RCP, RRT, CHT Entered By: Lorine Bears on 01/08/2015 13:28:57 [name] ([id number]) -------------------------------------------------------------------------------- Encounter Discharge Information Details Patient Name: [name] Date of Service: 01/08/2015 1:00 PM Medical Record Number: [id number] Patient Account Number: [id number] Date of Birth/Sex: November 24, 1927 (81 y.o. Female) Treating RN: Primary Care Physician: Constance Goltz Other Clinician: Jacqulyn Bath Referring Physician: Constance Goltz Treating Physician/Extender: Frann Rider in Treatment: 40 Encounter Discharge Information Items Discharge Pain  Level: 0 Discharge Condition: Stable Ambulatory Status: Wheelchair Nursing Discharge Destination: Home Transportation: Other [name], Accompanied By: transport Schedule Follow-up Appointment: No Medication Reconciliation completed No and provided to Patient/Care Aleeya Veitch: Clinical Summary of Care: Notes Patient has completed her course of 30 HBO treatments. Electronic Signature(s) Signed: 01/08/2015 4:01:52 PM By: Lorine Bears RCP, RRT, CHT Entered By: Lorine Bears on 01/08/2015 16:01:34 [name] ([id number]) -------------------------------------------------------------------------------- Vitals Details Patient Name: [name] Date of Service: 01/08/2015 1:00 PM Medical Record Number: [id number] Patient Account Number: [id number] Date of Birth/Sex: Dec 07, 1927 (81 y.o. Female) Treating RN: Primary Care Physician: Constance Goltz Other Clinician: Jacqulyn Bath Referring Physician: Constance Goltz Treating Physician/Extender: Frann Rider in Treatment: 16 Vital Signs Time Taken: 12:50 Temperature (F): 97.8 Height (in): 64 Pulse (bpm): 60 Weight (lbs): 180 Respiratory Rate (breaths/min): 18 Body Mass Index (BMI): 30.9 Blood Pressure (mmHg): 106/62 Reference Range: 80 - 120 mg / dl Electronic Signature(s) Signed: 01/08/2015 4:01:52 PM By: Lorine Bears RCP, RRT, CHT Entered By: Lorine Bears on 01/08/2015 13:30:20

## 2015-01-09 ENCOUNTER — Encounter: Payer: Medicaid Other | Admitting: Surgery

## 2015-01-09 NOTE — Progress Notes (Signed)
[name], [name] ([id number]) Visit Report for 01/08/2015 HBO Details Patient Name: [name], [name] Date of Service: 01/08/2015 1:00 PM Medical Record Number: [id number] Patient Account Number: [id number] Date of Birth/Sex: 01-17-1928 (81 y.o. 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, Peripheral Treatment Series Complete; Non- HBO Discharge Wound Protocol Completed with Outcome: Symptom Relief HBO Treatment Details Treatment Number: 30 Patient Type: Outpatient Chamber Type: Monoplace Chamber #: [id number] Treatment Protocol: 2.0 ATA with 90 minutes oxygen, and no air breaks Treatment Details Compression Rate Down: 1.5 psi / minute De-Compression Rate Up: 1.5 psi / minute Air breaks and breathing Compress Tx Pressure Decompress Decompress periods Begins Reached Begins Ends (leave unused spaces blank) Chamber Pressure 1 ATA 2.0 ATA - - - - - - 2.0 ATA 1 ATA Clock Time (24 hr) 13:16 13:28 - - - - - - 14:58 15:09 Treatment Length: 113 (minutes) Treatment Segments: 4 Capillary Blood Glucose Pre Capillary Blood Glucose (mg/dl): Post Capillary Blood Glucose (mg/dl): Vital Signs Capillary Blood Glucose Reference Range: 80 - 120 mg / dl HBO Diabetic Blood Glucose Intervention Range: <131 mg/dl or >249 mg/dl Time Vitals Blood Respiratory Capillary Blood Glucose Pulse Action Type: Pulse: Temperature: Taken: Pressure: Rate: Glucose (mg/dl): Meter #: Oximetry (%) Taken: Pre 12:50 106/62 60 18 97.8 Post 15:10 120/70 72 18 98 [name], [name] ([id number]) Treatment  Response Treatment Well Toleration: Treatment Treatment Completed without Adverse Event Completion Status: HBO Attestation I certify that I supervised this HBO treatment in accordance with Medicare guidelines. A trained Yes emergency response team is readily available per hospital policies and procedures. Continue HBOT as ordered. Yes Electronic Signature(s) Signed: 01/08/2015 5:41:46 PM By: Christin Fudge MD, FACS Previous Signature: 01/08/2015 4:01:52 PM Version By: Lorine Bears RCP, RRT, CHT Entered By: Christin Fudge on 01/08/2015 16:27:50 [name], [name] ([id number]) -------------------------------------------------------------------------------- HBO Safety Checklist Details Patient Name: [name] Date of Service: 01/08/2015 1:00 PM Medical Record Number: [id number] Patient Account Number: [id number] Date of Birth/Sex: 1928-05-01 (81 y.o. Female) Treating RN: Primary Care Physician: Constance Goltz Other Clinician: Jacqulyn Bath Referring Physician: Constance Goltz Treating Physician/Extender: Frann Rider in Treatment: 16 HBO Safety Checklist Items Safety Checklist Consent Form Signed Patient voided / foley secured and emptied When did you last eato 12:00 pm Last dose of injectable or oral agent n/a Ostomy pouch emptied and vented if applicable n/a All implantable devices assessed, documented and approved n/a Intravenous access site secured and place n/a Valuables secured Linens and cotton and cotton/polyester blend (less than 51% polyester) Personal oil-based products / skin lotions / body lotions removed Wigs or hairpieces removed Smoking or tobacco materials removed Books / newspapers / magazines / loose paper removed Cologne, aftershave, perfume and deodorant removed Jewelry removed (may wrap wedding band) Make-up removed Hair care products removed Battery operated devices (external) removed Heating patches and chemical warmers  removed Titanium eyewear removed Nail polish cured greater than 10 hours Casting material cured greater than 10 hours n/a Hearing aids removed n/a Loose dentures or partials removed Prosthetics have been removed n/a Patient demonstrates correct use of air break device (if applicable) Patient concerns have been addressed Patient grounding bracelet on and cord attached to chamber Specifics for Inpatients (complete in addition to  above) Medication sheet sent with patient Intravenous medications needed or due during therapy sent with patient [name], [name] ([id number]) Drainage tubes (e.g. nasogastric tube or chest tube secured and vented) Endotracheal or Tracheotomy tube secured Cuff deflated of air and inflated with saline Airway suctioned Electronic Signature(s) Signed: 01/08/2015 4:01:52 PM By: Lorine Bears RCP, RRT, CHT Entered By: Lorine Bears on 01/08/2015 13:31:56

## 2015-01-10 ENCOUNTER — Encounter: Payer: Medicare Other | Admitting: Surgery

## 2015-01-11 ENCOUNTER — Encounter: Payer: Medicaid Other | Admitting: Surgery

## 2015-01-11 DIAGNOSIS — L97529 Non-pressure chronic ulcer of other part of left foot with unspecified severity: Secondary | ICD-10-CM | POA: Diagnosis not present

## 2015-01-12 ENCOUNTER — Encounter: Payer: Medicaid Other | Admitting: Surgery

## 2015-01-12 NOTE — Progress Notes (Signed)
SEVERINA, PESNELL ([id number]) Visit Report for 01/11/2015 Arrival Information Details Patient Name: [name], [name] Date of Service: 01/11/2015 1:00 PM Medical Record Number: [id number] Patient Account Number: [id number] Date of Birth/Sex: [birth year] (81 y.o. Female) Treating [name]: Montey Hora Primary Care Physician: Constance Goltz Other Clinician: Referring Physician: Constance Goltz Treating Physician/Extender: Frann Rider in Treatment: 69 Visit Information History Since Last Visit Added or deleted any medications: No Patient Arrived: Wheel Chair Any new allergies or adverse reactions: No Arrival Time: 13:00 Had a fall or experienced change in No Accompanied By: self activities of daily living that may affect Transfer Assistance: None risk of falls: Patient Identification Verified: Yes Signs or symptoms of abuse/neglect since last No Secondary Verification Process Yes visito Completed: Hospitalized since last visit: No Patient Requires Transmission- No Pain Present Now: No Based Precautions: Patient Has Alerts: Yes Patient Alerts: Patient on Blood Thinner DMII Plavix ABI L 0.63 R 0.79 Electronic Signature(s) Signed: 01/11/2015 5:04:08 PM By: Montey Hora Entered By: Montey Hora on [name] 13:06:49 [name] ([id number]) -------------------------------------------------------------------------------- Encounter Discharge Information Details Patient Name: [name] Date of Service: 01/11/2015 1:00 PM Medical Record Number: [id number] Patient Account Number: [id number] Date of Birth/Sex: Apr 15, 1928 (81 y.o. Female) Treating [name]: Primary Care Physician: Constance Goltz Other Clinician: Referring Physician: Constance Goltz Treating Physician/Extender: BURNS, Charlean Sanfilippo in Treatment: 34 Encounter Discharge Information Items Discharge Pain Level: 0 Discharge Condition: Stable Ambulatory Status: Wheelchair Discharge Destination: Home Transportation:  Private Auto Accompanied By: self Schedule Follow-up Appointment: Yes Medication Reconciliation completed and provided to Patient/Care No [name]: Provided on Clinical Summary of Care: 01/11/2015 Form Type Recipient Paper Patient DJ Electronic Signature(s) Signed: 01/11/2015 3:27:08 PM By: Montey Hora Previous Signature: 01/11/2015 2:05:44 PM Version By: Ruthine Dose Entered By: Montey Hora on [name] 15:27:08 [name] ([id number]) -------------------------------------------------------------------------------- Lower Extremity Assessment Details Patient Name: [name] Date of Service: 01/11/2015 1:00 PM Medical Record Number: [id number] Patient Account Number: [id number] Date of Birth/Sex: 09/18/1927 (81 y.o. Female) Treating [name]: Montey Hora Primary Care Physician: Constance Goltz Other Clinician: Referring Physician: Constance Goltz Treating Physician/Extender: Frann Rider in Treatment: 17 Vascular Assessment Pulses: Posterior Tibial Palpable: [Left:Yes] [Right:Yes] Dorsalis Pedis Palpable: [Left:Yes] [Right:Yes] Extremity colors, hair growth, and conditions: Extremity Color: [Left:Normal] [Right:Normal] Hair Growth on Extremity: [Left:No] [Right:No] Temperature of Extremity: [Left:Warm] [Right:Warm] Capillary Refill: [Left:< 3 seconds] [Right:< 3 seconds] Toe Nail Assessment Left: Right: Thick: No No Discolored: No No Deformed: No No Improper Length and Hygiene: No No Electronic Signature(s) Signed: 01/11/2015 5:04:08 PM By: Montey Hora Entered By: Montey Hora on [name] 13:18:01 [name] ([id number]) -------------------------------------------------------------------------------- Patient/Caregiver Education Details Patient Name: [name] Date of Service: 01/11/2015 1:00 PM Medical Record Number: [id number] Patient Account Number: [id number] Date of Birth/Gender: 1928-06-24 (81 y.o. Female) Treating [name]: Montey Hora Primary Care Physician: Constance Goltz Other Clinician: Referring Physician: Constance Goltz Treating Physician/Extender: Olivia Canter in Treatment: 17 Education Assessment Education Provided To: Patient Education Topics Provided Offloading: Handouts: Other: continue sage boots Methods: Demonstration, Explain/Verbal Responses: State content correctly Electronic Signature(s) Signed: 01/11/2015 3:27:26 PM By: Montey Hora Entered By: Montey Hora on [name] 15:27:26 [name] ([id number]) -------------------------------------------------------------------------------- Wound Assessment Details Patient Name: [name] Date of Service: 01/11/2015 1:00 PM Medical Record Number: [id number] Patient Account Number: [id number] Date of Birth/Sex: 07-06-1928 (81 y.o. Female) Treating [name]: Montey Hora Primary Care Physician: Constance Goltz Other Clinician: Referring Physician: Constance Goltz Treating Physician/Extender: Frann Rider in Treatment: 17 Wound Status Wound Number: 1 Primary Arterial Insufficiency Ulcer Etiology: Wound Location: Left Calcaneous -  Lateral Secondary Pressure Ulcer Wounding Event: Pressure Injury Etiology: Date Acquired: 07/24/2014 Wound Open Weeks Of Treatment: 17 Status: Clustered Wound: No Comorbid Glaucoma, Asthma, Hypertension, History: Peripheral Venous Disease, Type II Diabetes Photos Photo Uploaded By: [name] Cool, [name], [name], [name] 16:53:18 Wound Measurements Length: (cm) 1 Width: (cm) 0.9 Depth: (cm) 0.4 Area: (cm) 0.707 Volume: (cm) 0.283 % Reduction in Area: 86.2% % Reduction in Volume: 86.1% Epithelialization: Medium (34-66%) Tunneling: No Undermining: No Wound Description Classification: Partial Thickness Foul Odor Diabetic Severity (Wagner): Grade 1 Wound Margin: Distinct, outline attached Exudate Amount: Small Exudate Type: Serous Exudate Color: amber After Cleansing: No Wound  Bed Granulation Amount: Large (67-100%) Exposed Structure [name], [name] ([id number]) Granulation Quality: Pink Fascia Exposed: No Necrotic Amount: Small (1-33%) Fat Layer Exposed: No Necrotic Quality: Adherent Slough Tendon Exposed: No Muscle Exposed: No Joint Exposed: No Bone Exposed: No Limited to Skin Breakdown Periwound Skin Texture Texture Color No Abnormalities Noted: No No Abnormalities Noted: No Callus: No Atrophie Blanche: No Crepitus: No Cyanosis: No Excoriation: No Ecchymosis: No Fluctuance: No Erythema: No Friable: No Hemosiderin Staining: No Induration: No Mottled: No Localized Edema: Yes Pallor: No Rash: No Rubor: No Scarring: No Temperature / Pain Moisture Temperature: No Abnormality No Abnormalities Noted: No Tenderness on Palpation: Yes Dry / Scaly: No Maceration: No Moist: Yes Wound Preparation Ulcer Cleansing: Rinsed/Irrigated with Saline Topical Anesthetic Applied: Other: Lidocaine 4% Cream, Treatment Notes Wound #1 (Left, Lateral Calcaneous) 1. Cleansed with: Clean wound with Normal Saline 2. Anesthetic Topical Lidocaine 4% cream to wound bed prior to debridement 4. Dressing Applied: Prisma Ag 5. Secondary Dressing Applied Guaze, ABD and kerlix/Conform 7. Secured with Tape Notes stretch netting Electronic Signature(s) [place], [place] ([id number]) Signed: 01/11/2015 1:30:02 PM By: Montey Hora Previous Signature: 01/11/2015 1:29:01 PM Version By: Montey Hora Entered By: Montey Hora on [name] 13:30:02 [name] ([id number]) -------------------------------------------------------------------------------- Wound Assessment Details Patient Name: [name] Date of Service: 01/11/2015 1:00 PM Medical Record Number: [id number] Patient Account Number: [id number] Date of Birth/Sex: 1927/10/25 (81 y.o. Female) Treating [name]: Montey Hora Primary Care Physician: Constance Goltz Other Clinician: Referring Physician: Constance Goltz Treating Physician/Extender: Frann Rider in Treatment: 17 Wound Status Wound Number: 5 Primary Arterial Insufficiency Ulcer Etiology: Wound Location: Right Calcaneous Wound Open Wounding Event: Gradually Appeared Status: Date Acquired: 09/26/2014 Comorbid Glaucoma, Asthma, Hypertension, Weeks Of Treatment: 14 History: Peripheral Venous Disease, Type II Clustered Wound: No Diabetes Photos Photo Uploaded By: [name] Cool, [name], [name], [name] 16:53:19 Wound Measurements Length: (cm) 1.1 Width: (cm) 0.7 Depth: (cm) 0.3 Area: (cm) 0.605 Volume: (cm) 0.181 % Reduction in Area: 89% % Reduction in Volume: 67.1% Epithelialization: Medium (34-66%) Tunneling: No Undermining: No Wound Description Full Thickness Without Exposed Foul Odor A Classification: Support Structures Diabetic Severity Grade 1 (Wagner): Wound Margin: Distinct, outline attached Exudate Amount: Small Exudate Type: Serosanguineous Exudate Color: red, brown fter Cleansing: No Wound Bed Granulation Amount: Large (67-100%) Exposed Structure Diggins, [place]an ([id number]) Granulation Quality: Pink Fascia Exposed: No Necrotic Amount: Small (1-33%) Fat Layer Exposed: No Necrotic Quality: Adherent Slough Tendon Exposed: No Muscle Exposed: No Joint Exposed: No Bone Exposed: No Limited to Skin Breakdown Periwound Skin Texture Texture Color No Abnormalities Noted: No No Abnormalities Noted: No Callus: No Atrophie Blanche: No Crepitus: No Cyanosis: No Excoriation: No Ecchymosis: No Fluctuance: No Erythema: No Friable: No Hemosiderin Staining: Yes Induration: No Mottled: No Localized Edema: Yes Pallor: No Rash: No Rubor: No Scarring: No Temperature / Pain Moisture Temperature: No Abnormality No Abnormalities Noted: No Tenderness  on Palpation: Yes Dry / Scaly: Yes Maceration: No Moist: Yes Wound Preparation Ulcer Cleansing: Rinsed/Irrigated with Saline Topical Anesthetic  Applied: Other: Lidocaine 4% Ointment, Treatment Notes Wound #5 (Right Calcaneous) 1. Cleansed with: Clean wound with Normal Saline 2. Anesthetic Topical Lidocaine 4% cream to wound bed prior to debridement 4. Dressing Applied: Prisma Ag 5. Secondary Dressing Applied Guaze, ABD and kerlix/Conform 7. Secured with Tape Notes stretch netting Electronic Signature(s) [place], [place] ([id number]) Signed: 01/11/2015 1:29:45 PM By: Montey Hora Entered By: Montey Hora on [name] 13:29:45 [name] ([id number]) -------------------------------------------------------------------------------- Wound Assessment Details Patient Name: [name] Date of Service: 01/11/2015 1:00 PM Medical Record Number: [id number] Patient Account Number: [id number] Date of Birth/Sex: 03/01/1928 (81 y.o. Female) Treating [name]: Montey Hora Primary Care Physician: Constance Goltz Other Clinician: Referring Physician: Constance Goltz Treating Physician/Extender: Frann Rider in Treatment: 17 Wound Status Wound Number: 6 Primary Arterial Insufficiency Ulcer Etiology: Wound Location: Left Achilles Wound Open Wounding Event: Gradually Appeared Status: Date Acquired: 09/26/2014 Comorbid Glaucoma, Asthma, Hypertension, Weeks Of Treatment: 14 History: Peripheral Venous Disease, Type II Clustered Wound: No Diabetes Photos Photo Uploaded By: [name] Cool, [name], [name], [name] 16:53:37 Wound Measurements Length: (cm) 0.7 Width: (cm) 0.6 Depth: (cm) 0.1 Area: (cm) 0.33 Volume: (cm) 0.033 % Reduction in Area: 91.2% % Reduction in Volume: 98.2% Epithelialization: Medium (34-66%) Tunneling: No Undermining: No Wound Description Classification: Partial Thickness Foul Odor Diabetic Severity (Wagner): Grade 1 Wound Margin: Distinct, outline attached Exudate Amount: Small Exudate Type: Serous Exudate Color: amber After Cleansing: No Wound Bed Granulation Amount: Large (67-100%) Exposed  Structure Granulation Quality: Pink Fascia Exposed: No Necrotic Amount: None Present (0%) Fat Layer Exposed: No [place], [name] ([id number]) Tendon Exposed: No Muscle Exposed: No Joint Exposed: No Bone Exposed: No Limited to Skin Breakdown Periwound Skin Texture Texture Color No Abnormalities Noted: No No Abnormalities Noted: No Callus: No Atrophie Blanche: No Crepitus: No Cyanosis: No Excoriation: No Ecchymosis: No Fluctuance: No Erythema: No Friable: No Hemosiderin Staining: Yes Induration: No Mottled: No Localized Edema: Yes Pallor: No Rash: No Rubor: No Scarring: No Temperature / Pain Moisture Temperature: No Abnormality No Abnormalities Noted: No Tenderness on Palpation: Yes Dry / Scaly: Yes Maceration: No Moist: Yes Wound Preparation Ulcer Cleansing: Rinsed/Irrigated with Saline Topical Anesthetic Applied: Other: lidocaine 4%, Treatment Notes Wound #6 (Left Achilles) 1. Cleansed with: Clean wound with Normal Saline 2. Anesthetic Topical Lidocaine 4% cream to wound bed prior to debridement 4. Dressing Applied: Prisma Ag 5. Secondary Dressing Applied Guaze, ABD and kerlix/Conform 7. Secured with Tape Notes stretch netting Electronic Signature(s) Signed: 01/11/2015 1:30:26 PM By: Montey Hora Entered By: Montey Hora on [name] 13:30:26 [name], [name] ([id number]) [name], [name] ([id number]) -------------------------------------------------------------------------------- Wound Assessment Details Patient Name: [name] Date of Service: 01/11/2015 1:00 PM Medical Record Number: [id number] Patient Account Number: [id number] Date of Birth/Sex: June 01, 1928 (81 y.o. Female) Treating [name]: Montey Hora Primary Care Physician: Constance Goltz Other Clinician: Referring Physician: Constance Goltz Treating Physician/Extender: Frann Rider in Treatment: 17 Wound Status Wound Number: 8 Primary Arterial Insufficiency Ulcer Etiology: Wound  Location: Right Toe Fourth Wound Open Wounding Event: Not Known Status: Date Acquired: 01/11/2015 Comorbid Glaucoma, Asthma, Hypertension, Weeks Of Treatment: 0 History: Peripheral Venous Disease, Type II Clustered Wound: No Diabetes Photos Photo Uploaded By: [name] Cool, [name], [name], [name] 16:53:38 Wound Measurements Length: (cm) 0.1 Width: (cm) 0.1 Depth: (cm) 0.2 Area: (cm) 0.008 Volume: (cm) 0.002 % Reduction in Area: 0% % Reduction in Volume: 0% Epithelialization: None Tunneling: No Undermining: No Wound  Description Full Thickness Without Classification: Exposed Support Structures Diabetic Severity Grade 1 (Wagner): Wound Margin: Flat and Intact Exudate Amount: Medium Exudate Type: Purulent Exudate Color: yellow, brown, green Foul Odor After Cleansing: No Wound Bed Granulation Amount: Medium (34-66%) Exposed Structure [name], [name] ([id number]) Granulation Quality: Pink Fascia Exposed: No Necrotic Amount: Medium (34-66%) Fat Layer Exposed: No Necrotic Quality: Adherent Slough Tendon Exposed: No Muscle Exposed: No Joint Exposed: No Bone Exposed: No Limited to Skin Breakdown Periwound Skin Texture Texture Color No Abnormalities Noted: No No Abnormalities Noted: No Callus: No Atrophie Blanche: No Crepitus: No Cyanosis: No Excoriation: No Ecchymosis: No Fluctuance: No Erythema: No Friable: No Hemosiderin Staining: No Induration: No Mottled: No Localized Edema: No Pallor: No Rash: No Rubor: No Scarring: No Temperature / Pain Moisture Temperature: No Abnormality No Abnormalities Noted: No Tenderness on Palpation: Yes Dry / Scaly: No Maceration: No Moist: No Wound Preparation Ulcer Cleansing: Rinsed/Irrigated with Saline Topical Anesthetic Applied: Other: lidocaine 4%, Electronic Signature(s) Signed: 01/11/2015 5:04:08 PM By: Montey Hora Entered By: Montey Hora on [name] 13:39:40 [name]  ([id number]) -------------------------------------------------------------------------------- Vitals Details Patient Name: [name] Date of Service: 01/11/2015 1:00 PM Medical Record Number: [id number] Patient Account Number: [id number] Date of Birth/Sex: 09-05-1927 (81 y.o. Female) Treating [name]: Montey Hora Primary Care Physician: Constance Goltz Other Clinician: Referring Physician: Constance Goltz Treating Physician/Extender: Frann Rider in Treatment: 17 Vital Signs Time Taken: 13:08 Temperature (F): 97.9 Height (in): 64 Pulse (bpm): 67 Weight (lbs): 180 Respiratory Rate (breaths/min): 18 Body Mass Index (BMI): 30.9 Blood Pressure (mmHg): 127/52 Reference Range: 80 - 120 mg / dl Electronic Signature(s) Signed: 01/11/2015 5:04:08 PM By: Montey Hora Entered By: Montey Hora on [name] 13:09:01

## 2015-01-16 NOTE — Progress Notes (Addendum)
[name] ([id number]) Visit Report for 01/11/2015 Chief Complaint Document Details Patient Name: [name], [name] Date of Service: 01/11/2015 1:00 PM Medical Record Number: [id number] Patient Account Number: [id number] Date of Birth/Sex: [birth year] (81 y.o. Female) Treating RN: Primary Care Physician: Constance Goltz Other Clinician: Referring Physician: Constance Goltz Treating Physician/Extender: BURNS III, Charlean Sanfilippo in Treatment: 17 Information Obtained from: Patient Chief Complaint Patient presents to the wound care center today with an open arterial ulcer on the left foot big toe and lateral heal. She also has some superficial ulcerations on the second and third toe dorsum. she has no family member with her today and she is a very poor historian and cannot give a proper history. As stated that she walks around with a walker. Electronic Signature(s) Signed: 01/11/2015 4:25:37 PM By: Loletha Grayer MD Entered By: Loletha Grayer on 01/11/2015 13:39:15 [name] ([id number]) -------------------------------------------------------------------------------- Debridement Details Patient Name: [name] Date of Service: 01/11/2015 1:00 PM Medical Record Number: [id number] Patient Account Number: [id number] Date of Birth/Sex: Jan 15, 1928 (81 y.o. Female) Treating RN: Primary Care Physician: Constance Goltz Other Clinician: Referring Physician: Constance Goltz Treating Physician/Extender: BURNS III, Charlean Sanfilippo in Treatment: 17 Debridement Performed for Wound #5 Right Calcaneous Assessment: Performed By: Physician BURNS III, Koichi [place], Debridement: Open Wound/Selective Debridement Selective Description: Pre-procedure Yes Verification/Time Out Taken: Start Time: 13:32 Pain Control: Lidocaine 4% Topical Solution Level: Non-Viable Tissue Total Area Debrided (L x 1.1 (cm) x 0.7 (cm) = 0.77 (cm) W): Tissue and other Non-Viable, Fibrin/Slough material  debrided: Instrument: Curette Bleeding: None End Time: 13:34 Procedural Pain: 0 Post Procedural Pain: 0 Response to Treatment: Procedure was tolerated well Post Debridement Measurements of Total Wound Length: (cm) 1.1 Width: (cm) 0.7 Depth: (cm) 0.4 Volume: (cm) 0.242 Electronic Signature(s) Signed: 01/11/2015 4:25:37 PM By: Loletha Grayer MD Entered By: Loletha Grayer on 01/11/2015 13:37:37 [name] ([id number]) -------------------------------------------------------------------------------- Debridement Details Patient Name: [name] Date of Service: 01/11/2015 1:00 PM Medical Record Number: [id number] Patient Account Number: [id number] Date of Birth/Sex: Oct 17, 1927 (81 y.o. Female) Treating RN: Primary Care Physician: Constance Goltz Other Clinician: Referring Physician: Constance Goltz Treating Physician/Extender: BURNS III, Charlean Sanfilippo in Treatment: 17 Debridement Performed for Wound #8 Right Toe Fourth Assessment: Performed By: Physician BURNS III, Verniece Encarnacion, Debridement: Open Wound/Selective Debridement Selective Description: Pre-procedure Yes Verification/Time Out Taken: Start Time: 13:34 Pain Control: Lidocaine 4% Topical Solution Level: Non-Viable Tissue Total Area Debrided (L x 0.1 (cm) x 0.1 (cm) = 0.01 (cm) W): Tissue and other Non-Viable, Fibrin/Slough material debrided: Instrument: Curette Bleeding: None End Time: 13:35 Procedural Pain: 0 Post Procedural Pain: 0 Response to Treatment: Procedure was tolerated well Post Debridement Measurements of Total Wound Length: (cm) 0.2 Width: (cm) 0.2 Depth: (cm) 0.2 Volume: (cm) 0.006 Electronic Signature(s) Signed: 01/11/2015 4:25:37 PM By: Loletha Grayer MD Entered By: Loletha Grayer on 01/11/2015 13:38:13 [name] ([id number]) -------------------------------------------------------------------------------- Debridement Details Patient Name: [name] Date of  Service: 01/11/2015 1:00 PM Medical Record Number: [id number] Patient Account Number: [id number] Date of Birth/Sex: 1927/10/05 (81 y.o. Female) Treating RN: Primary Care Physician: Constance Goltz Other Clinician: Referring Physician: Constance Goltz Treating Physician/Extender: BURNS III, Charlean Sanfilippo in Treatment: 17 Debridement Performed for Wound #1 Left,Lateral Calcaneous Assessment: Performed By: Physician BURNS III, Sevannah Madia, Debridement: Open Wound/Selective Debridement Selective Description: Pre-procedure Yes Verification/Time Out Taken: Start Time: 13:32 Pain Control: Lidocaine 4% Topical Solution Level: Non-Viable Tissue Total Area Debrided (L x 1 (cm) x 0.9 (cm) = 0.9 (cm) W): Tissue and other Non-Viable, Fibrin/Slough material debrided:  Instrument: Curette Bleeding: None End Time: 13:34 Procedural Pain: 0 Post Procedural Pain: 0 Response to Treatment: Procedure was tolerated well Post Debridement Measurements of Total Wound Length: (cm) 1 Width: (cm) 1 Depth: (cm) 0.4 Volume: (cm) 0.314 Electronic Signature(s) Signed: 01/11/2015 4:25:37 PM By: Loletha Grayer MD Entered By: Loletha Grayer on 01/11/2015 13:39:06 [name] ([id number]) -------------------------------------------------------------------------------- HPI Details Patient Name: [name] Date of Service: 01/11/2015 1:00 PM Medical Record Number: [id number] Patient Account Number: [id number] Date of Birth/Sex: 03-04-1928 (81 y.o. Female) Treating RN: Primary Care Physician: Constance Goltz Other Clinician: Referring Physician: Constance Goltz Treating Physician/Extender: BURNS III, Charlean Sanfilippo in Treatment: 17 History of Present Illness Location: left heel Quality: Patient reports experiencing a dull pain to affected area(s). Severity: Patient states wound are getting worse. Duration: Patient states that they are not certain how long the wound has been present. Timing: Pain in wound  is Intermittent (comes and goes Context: The wound appeared gradually over time Associated Signs and Symptoms: Patient reports having difficulty standing for long periods. HPI Description: This 81 year old patient is a poor historian and comes from a nursing home. Does not have any family members with her. Says she has a ulcer on the lateral part of her left foot and some new ones are there on her right foot too. She is unable to say how long she's had these. she does walk with a walker and this is limited most of the day. Does not recall if she has had any vascular studies done. I have reviewed an x-ray done of the left foot which basically shows osteoporosis but no evidence of fracture dislocation or osteomyelitis. 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 She seems to be doing better overall. 10/19/14 -- I understand she has gone to the vascular surgery and lab yesterday and has had all her tests done yesterday. She is doing fine otherwise and has had no fresh issues. 10/26/2014 she seems to be doing well and has no fresh problems and seems much brighter. 10/12/14 -- Taking her antibiotic and continues to do her dressings locally and she is helped by her skilled nursing. She seems to be doing fine and has no fresh issues. 10/19/14 -- she is doing fine and has had no fresh issues. She says her son to go for her vascular workup yesterday and she has been called back after 3 months. we will try and gather these reports to review what they said. 10/26/14 --I have been able to review notes from the vascular surgery office and these were dated from 10/18/2014. He patient was on her first postop visit and had Doppler ultrasounds done of her arteries. She had more than 50% stenosis of the right superficial femoral artery and more than  50% stenosis in the left tibioperoneal trunk. She was status post a left lower extremity angiogram with angioplasty of the peroneal and left superficial femoral arteries for a nonhealing left foot and ankle ulceration. note is made of the fact that the ABIs had not changed in the last month since her previous visit. The opinion of her provider was that she did not need any intervention at this time and they had done everything they could at the present time. They would continue on Plavix and antibiotics and see her back in 3 months for a another  arterial duplex study of the lower extremities. EWELINA, ARCIERO ([id number]) 11/02/2014 -- she has spoken to her caregivers about coming for HBO 5 times per week for 6-8 weeks and they are agreeable about this and she is willing to undergo hyperbaric oxygen therapy. 11/16/2014 she has been worked up with a EKG and chest x-ray and these were within normal limits. As far as the investigations go she is ready for HBOT. we are awaiting some insurance clearance so that she can start on her hyperbaric oxygen therapy. 11/30/2014 she tolerated hyperbaric oxygen therapy fine and there were no issues with her blood glucose levels. As noted she is not a diabetic and most of her fingerstick blood glucose levels are inaccurate because of the Raynaud's phenomena. She normally has to have your lobe sticks to get any random glucose levels. I have also requested her primary care does a venous blood glucose draw to check her glucose level. 12/21/2014 -- she is tolerating hyperbaric oxygen very well and has overall made a lot of progress. Her left leg is looking much better. 01/11/2015 - No new complaints today. New ulceration on the dorsum of the right second toe noted today on physical exam. Patient unaware. No significant pain. No fever or chills. No significant drainage. Offloading with sage boots at night. Significantly improved with HBO. Electronic  Signature(s) Signed: 01/11/2015 4:25:37 PM By: Loletha Grayer MD Entered By: Loletha Grayer on 01/11/2015 13:40:46 [name] ([id number]) -------------------------------------------------------------------------------- Physical Exam Details Patient Name: [name] Date of Service: 01/11/2015 1:00 PM Medical Record Number: [id number] Patient Account Number: [id number] Date of Birth/Sex: March 07, 1928 (81 y.o. Female) Treating RN: Primary Care Physician: Constance Goltz Other Clinician: Referring Physician: Constance Goltz Treating Physician/Extender: BURNS III, Asier Desroches Weeks in Treatment: 17 Constitutional . Pulse regular. Respirations normal and unlabored. Afebrile. Marland Kitchen Respiratory WNL. No retractions.. Cardiovascular . Integumentary (Hair, Skin) .Marland Kitchen Neurological Sensation normal to touch, pin,and vibration. Psychiatric Judgement and insight Intact.. Oriented times 3.. No evidence of depression, anxiety, or agitation.. Notes Bilateral heel ulcerations much improved based on pictures and measurements. Healthy granulating base. No exposed deep structures. No probe to bone. No significant cellulitis. No palpable pedal pulses per her baseline. No significant edema. New ulceration on the dorsum of the right second toe. Full thickness. Probes close but not directly to bone. Unknown duration. No cellulitis. Electronic Signature(s) Signed: 01/11/2015 4:25:37 PM By: Loletha Grayer MD Entered By: Loletha Grayer on 01/11/2015 13:45:51 [name] ([id number]) -------------------------------------------------------------------------------- Physician Orders Details Patient Name: [name] Date of Service: 01/11/2015 1:00 PM Medical Record Number: [id number] Patient Account Number: [id number] Date of Birth/Sex: [birth year] (81 y.o. Female) Treating RN: Montey Hora Primary Care Physician: Constance Goltz Other Clinician: Referring Physician: Constance Goltz Treating  Physician/Extender: BURNS III, Charlean Sanfilippo in Treatment: 56 Verbal / Phone Orders: Yes Clinician: Montey Hora Read Back and Verified: Yes Diagnosis Coding Wound Cleansing Wound #1 Left,Lateral Calcaneous o Clean wound with Normal Saline. - be sure to clean out all remaining prisma Wound #5 Right Calcaneous o Clean wound with Normal Saline. - be sure to clean out all remaining prisma Wound #6 Left Achilles o Clean wound with Normal Saline. - be sure to clean out all remaining prisma Wound #8 Right Toe Second o Clean wound with Normal Saline. - be sure to clean out all remaining prisma Anesthetic Wound #1 Left,Lateral Calcaneous o Topical Lidocaine 4% cream applied to wound bed prior to debridement o Hurricaine Topical Anesthetic Spray applied to wound bed prior to debridement  Wound #5 Right Calcaneous o Topical Lidocaine 4% cream applied to wound bed prior to debridement o Hurricaine Topical Anesthetic Spray applied to wound bed prior to debridement Wound #6 Left Achilles o Topical Lidocaine 4% cream applied to wound bed prior to debridement o Hurricaine Topical Anesthetic Spray applied to wound bed prior to debridement Wound #8 Right Toe Second o Topical Lidocaine 4% cream applied to wound bed prior to debridement o Hurricaine Topical Anesthetic Spray applied to wound bed prior to debridement Primary Wound Dressing Wound #1 Left,Lateral Calcaneous o Prisma Ag Wound #5 Right Calcaneous o Prisma Ag [place], Cl[place] ([id number]) Wound #6 Left Achilles o Prisma Ag Wound #8 Right Toe Second o Prisma Ag Secondary Dressing Wound #1 Left,Lateral Calcaneous o ABD and Kerlix/Conform Wound #5 Right Calcaneous o ABD and Kerlix/Conform Wound #6 Left Achilles o ABD and Kerlix/Conform Wound #8 Right Toe Second o ABD and Kerlix/Conform Dressing Change Frequency Wound #1 Left,Lateral Calcaneous o Change dressing every other day. Wound #5  Right Calcaneous o Change dressing every other day. Wound #6 Left Achilles o Change dressing every other day. Wound #8 Right Toe Second o Change dressing every other day. Follow-up Appointments Wound #1 Left,Lateral Calcaneous o Return Appointment in 1 week. Wound #5 Right Calcaneous o Return Appointment in 1 week. Wound #6 Left Achilles o Return Appointment in 1 week. Wound #8 Right Toe Second o Return Appointment in 1 week. Off-Loading Wound #1 Left,Lateral Calcaneous [place], Liz ([id number]) o Other: - SAGE boots while sitting or lying. Do not wear while ambulating. Wound #5 Right Calcaneous o Other: - SAGE boots while sitting or lying. Do not wear while ambulating. Wound #6 Left Achilles o Other: - SAGE boots while sitting or lying. Do not wear while ambulating. Wound #8 Right Toe Second o Other: - SAGE boots while sitting or lying. Do not wear while ambulating. Home Health Wound #1 [place] [facility] - [facility] - please set up xray of right 2nd toe r/t wound with purulent drainage o Home Health Nurse may visit PRN to address patientos wound care needs. o FACE TO FACE ENCOUNTER: MEDICARE and MEDICAID PATIENTS: I certify that this patient is under my care and that I had a face-to-face encounter that meets the physician face-to-face encounter requirements with this patient on this date. The encounter with the patient was in whole or in part for the following MEDICAL CONDITION: (primary reason for [place]) MEDICAL NECESSITY: I certify, that based on my findings, NURSING services are a medically necessary home health service. HOME BOUND STATUS: I certify that my clinical findings support that this patient is homebound (i.e., Due to illness or injury, pt requires aid of supportive devices such as crutches, cane, wheelchairs, walkers, the use of special transportation or the assistance of another person to  leave their place of residence. There is a normal inability to leave the home and doing so requires considerable and taxing effort. Other absences are for medical reasons / religious services and are infrequent or of short duration when for other reasons). o If current dressing causes regression in wound condition, may D/C ordered dressing product/s and apply Normal Saline Moist Dressing daily until next [place] / Other MD appointment. [place] of regression in wound condition at [phone]. o Please direct any NON-WOUND related issues/requests for orders to patient's Primary Care Physician Wound #5 Right [place] [facility] - [facility] - please set up xray of right 2nd toe  r/t wound with purulent drainage o Home Health Nurse may visit PRN to address patientos wound care needs. o FACE TO FACE ENCOUNTER: MEDICARE and MEDICAID PATIENTS: I certify that this patient is under my care and that I had a face-to-face encounter that meets the physician face-to-face encounter requirements with this patient on this date. The encounter with the patient was in whole or in part for the following MEDICAL CONDITION: (primary reason for [place]) MEDICAL NECESSITY: I certify, that based on my findings, NURSING services are a medically necessary home health service. HOME BOUND STATUS: I certify that my clinical findings support that this patient is homebound (i.e., Due to illness or injury, pt requires aid of supportive devices such as crutches, cane, wheelchairs, walkers, the use of special transportation or the assistance of another person to leave their place of residence. There is a normal inability to leave the home and doing so requires considerable and taxing effort. Other absences are for medical reasons / religious services and are infrequent or of short duration when for other reasons). [place], [place] ([id number]) o If current  dressing causes regression in wound condition, may D/C ordered dressing product/s and apply Normal Saline Moist Dressing daily until next [place] / Other MD appointment. [place] of regression in wound condition at [phone]. o Please direct any NON-WOUND related issues/requests for orders to patient's Primary Care Physician Wound #6 Left Achilles o [place] [facility] - [facility] - please set up xray of right 2nd toe r/t wound with purulent drainage o Home Health Nurse may visit PRN to address patientos wound care needs. o FACE TO FACE ENCOUNTER: MEDICARE and MEDICAID PATIENTS: I certify that this patient is under my care and that I had a face-to-face encounter that meets the physician face-to-face encounter requirements with this patient on this date. The encounter with the patient was in whole or in part for the following MEDICAL CONDITION: (primary reason for [place]) MEDICAL NECESSITY: I certify, that based on my findings, NURSING services are a medically necessary home health service. HOME BOUND STATUS: I certify that my clinical findings support that this patient is homebound (i.e., Due to illness or injury, pt requires aid of supportive devices such as crutches, cane, wheelchairs, walkers, the use of special transportation or the assistance of another person to leave their place of residence. There is a normal inability to leave the home and doing so requires considerable and taxing effort. Other absences are for medical reasons / religious services and are infrequent or of short duration when for other reasons). o If current dressing causes regression in wound condition, may D/C ordered dressing product/s and apply Normal Saline Moist Dressing daily until next [place] / Other MD appointment. [place] of regression in wound condition at [phone]. o Please direct any NON-WOUND related  issues/requests for orders to patient's Primary Care Physician Wound #8 Right Toe Second o [place] [facility] - [facility] - please set up xray of right 2nd toe r/t wound with purulent drainage o Home Health Nurse may visit PRN to address patientos wound care needs. o FACE TO FACE ENCOUNTER: MEDICARE and MEDICAID PATIENTS: I certify that this patient is under my care and that I had a face-to-face encounter that meets the physician face-to-face encounter requirements with this patient on this date. The encounter with the patient was in whole or in part for the following MEDICAL CONDITION: (primary reason for [place]) MEDICAL NECESSITY: I  certify, that based on my findings, NURSING services are a medically necessary home health service. HOME BOUND STATUS: I certify that my clinical findings support that this patient is homebound (i.e., Due to illness or injury, pt requires aid of supportive devices such as crutches, cane, wheelchairs, walkers, the use of special transportation or the assistance of another person to leave their place of residence. There is a normal inability to leave the home and doing so requires considerable and taxing effort. Other absences are for medical reasons / religious services and are infrequent or of short duration when for other reasons). o If current dressing causes regression in wound condition, may D/C ordered dressing product/s and apply Normal Saline Moist Dressing daily until next [place] / Other MD appointment. [place] of regression in wound condition at [phone]. o Please direct any NON-WOUND related issues/requests for orders to patient's Primary Care Physician [place], [place] ([id number]) Hyperbaric Oxygen Therapy Wound #1 Left,Lateral Calcaneous o Discontinue HBO Therapy - completed HOB therapy Wound #5 Right Calcaneous o Discontinue HBO Therapy - completed HOB therapy Wound #6 Left  Achilles o Discontinue HBO Therapy - completed HOB therapy Wound #8 Right Toe Second o Discontinue HBO Therapy - completed [facility] therapy Radiology o X-ray, toes - right 2nd toe oooo Electronic Signature(s) Signed: 01/11/2015 2:01:05 PM By: Montey Hora Entered By: Montey Hora on 01/11/2015 14:01:05 [name] ([id number]) -------------------------------------------------------------------------------- Problem List Details Patient Name: [name] Date of Service: 01/11/2015 1:00 PM Medical Record Number: [id number] Patient Account Number: [id number] Date of Birth/Sex: Sep 17, 1927 (81 y.o. 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 09/12/2014 Yes of heel and midfoot I70.235 Atherosclerosis of native arteries of right leg with 09/12/2014 Yes ulceration of other part of foot L97.412 Non-pressure chronic ulcer of right heel and midfoot with 10/26/2014 Yes fat layer exposed S91.104A Unspecified open wound of right lesser toe(s) without 01/11/2015 Yes damage to nail, initial encounter Inactive Problems Resolved Problems Electronic Signature(s) Signed: 01/11/2015 4:25:37 PM By: Loletha Grayer MD Signed: 01/16/2015 7:58:07 AM By: Christin Fudge MD, FACS Entered By: Loletha Grayer on 01/11/2015 13:36:58 [name] ([id number]) -------------------------------------------------------------------------------- Progress Note Details Patient Name: [name] Date of Service: 01/11/2015 1:00 PM Medical Record Number: [id number] Patient Account Number: [id number] Date of Birth/Sex: 05/22/1928 (81  y.o. Female) Treating RN: Primary Care Physician: Constance Goltz Other Clinician: Referring Physician: Constance Goltz Treating Physician/Extender: Suella [place] in Treatment: 17 Subjective Chief Complaint Information obtained from Patient Patient presents to the wound care center today with an open arterial ulcer on the left foot big toe and lateral heal. She also has some superficial ulcerations on the second and third toe dorsum. she has no family member with her today and she is a very poor historian and cannot give a proper history. As stated that she walks around with a walker. History of Present Illness (HPI) The following HPI elements were documented for the patient's wound: Location: left heel Quality: Patient reports experiencing a dull pain to affected area(s). Severity: Patient states wound are getting worse. Duration: Patient states that they are not certain how long the wound has been present. Timing: Pain in wound is Intermittent (comes and goes Context: The wound appeared gradually over  time Associated Signs and Symptoms: Patient reports having difficulty standing for long periods. This 81 year old patient is a poor historian and comes from a nursing home. Does not have any family members with her. Says she has a ulcer on the lateral part of her left foot and some new ones are there on her right foot too. She is unable to say how long she's had these. she does walk with a walker and this is limited most of the day. Does not recall if she has had any vascular studies done. I have reviewed an x-ray done of the left foot which basically shows osteoporosis but no evidence of fracture dislocation or osteomyelitis. 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 She seems to be doing better overall. 10/19/14 -- I understand  she has gone to the vascular surgery and lab yesterday and has had all her tests done yesterday. She is doing fine otherwise and has had no fresh issues. 10/26/2014 she seems to be doing well and has no fresh problems and seems much brighter. 10/12/14 -- Taking her antibiotic and continues to do her dressings locally and she is helped by her skilled nursing. She seems to be doing fine and has no fresh issues. 10/19/14 -- she is doing fine and has had no fresh issues. She says her son to go for her vascular workup yesterday and she has been called back after 3 months. we will try and gather these reports to review what TACORI, WILLHOITE ([id number]) they said. 10/26/14 --I have been able to review notes from the vascular surgery office and these were dated from 10/18/2014. He patient was on her first postop visit and had Doppler ultrasounds done of her arteries. She had more than 50% stenosis of the right superficial femoral artery and more than 50% stenosis in the left tibioperoneal trunk. She was status post a left lower extremity angiogram with angioplasty of the peroneal and left superficial femoral arteries for a nonhealing left foot and ankle ulceration. note is made of the fact that the ABIs had not changed in the last month since her previous visit. The opinion of her provider was that she did not need any intervention at this time and they had done everything they could at the present time. They would continue on Plavix and antibiotics and see her back in 3 months for a another arterial duplex study of the lower extremities. 11/02/2014 -- she has spoken to her caregivers about coming for HBO 5 times per week for 6-8 weeks and they are agreeable about this and she is willing to undergo hyperbaric oxygen therapy. 11/16/2014 she has been worked up with a EKG and chest x-ray and these were within normal limits. As far as the investigations go she is ready for HBOT. we are awaiting some insurance  clearance so that she can start on her hyperbaric oxygen therapy. 11/30/2014 she tolerated hyperbaric oxygen therapy fine and there were no issues with her blood glucose levels. As noted she is not a diabetic and most of her fingerstick blood glucose levels are inaccurate because of the Raynaud's phenomena. She normally has to have your lobe sticks to get any random glucose levels. I have also requested her primary care does a venous blood glucose draw to check her glucose level. 12/21/2014 -- she is tolerating hyperbaric oxygen  very well and has overall made a lot of progress. Her left leg is looking much better. 01/11/2015 - No new complaints today. New ulceration on the dorsum of the right second toe noted today on physical exam. Patient unaware. No significant pain. No fever or chills. No significant drainage. Offloading with sage boots at night. Significantly improved with HBO. Objective Constitutional Pulse regular. Respirations normal and unlabored. Afebrile. Vitals Time Taken: 1:08 PM, Height: 64 in, Weight: 180 lbs, BMI: 30.9, Temperature: 97.9 F, Pulse: 67 bpm, Respiratory Rate: 18 breaths/min, Blood Pressure: 127/52 mmHg. Respiratory WNL. No retractions.. Neurological McLean, Nakea ([id number]) Sensation normal to touch, pin,and vibration. Psychiatric Judgement and insight Intact.. Oriented times 3.. No evidence of depression, anxiety, or agitation.. General Notes: Bilateral heel ulcerations much improved based on pictures and measurements. Healthy granulating base. No exposed deep structures. No probe to bone. No significant cellulitis. No palpable pedal pulses per her baseline. No significant edema. New ulceration on the dorsum of the right second toe. Full thickness. Probes close but not directly to bone. Unknown duration. No cellulitis. Integumentary (Hair, Skin) Wound #1 status is Open. Original cause of wound was Pressure Injury. The wound is located on  the Left,Lateral Calcaneous. The wound measures 1cm length x 0.9cm width x 0.4cm depth; 0.707cm^2 area and 0.283cm^3 volume. The wound is limited to skin breakdown. There is no tunneling or undermining noted. There is a small amount of serous drainage noted. The wound margin is distinct with the outline attached to the wound base. There is large (67-100%) pink granulation within the wound bed. There is a small (1-33%) amount of necrotic tissue within the wound bed including Adherent Slough. The periwound skin appearance exhibited: Localized Edema, Moist. The periwound skin appearance did not exhibit: Callus, Crepitus, Excoriation, Fluctuance, Friable, Induration, Rash, Scarring, Dry/Scaly, Maceration, Atrophie Blanche, Cyanosis, Ecchymosis, Hemosiderin Staining, Mottled, Pallor, Rubor, Erythema. Periwound temperature was noted as No Abnormality. The periwound has tenderness on palpation. Wound #5 status is Open. Original cause of wound was Gradually Appeared. The wound is located on the Right Calcaneous. The wound measures 1.1cm length x 0.7cm width x 0.3cm depth; 0.605cm^2 area and 0.181cm^3 volume. The wound is limited to skin breakdown. There is no tunneling or undermining noted. There is a small amount of serosanguineous drainage noted. The wound margin is distinct with the outline attached to the wound base. There is large (67-100%) pink granulation within the wound bed. There is a small (1-33%) amount of necrotic tissue within the wound bed including Adherent Slough. The periwound skin appearance exhibited: Localized Edema, Dry/Scaly, Moist, Hemosiderin Staining. The periwound skin appearance did not exhibit: Callus, Crepitus, Excoriation, Fluctuance, Friable, Induration, Rash, Scarring, Maceration, Atrophie Blanche, Cyanosis, Ecchymosis, Mottled, Pallor, Rubor, Erythema. Periwound temperature was noted as No Abnormality. The periwound has tenderness on palpation. Wound #6 status is Open.  Original cause of wound was Gradually Appeared. The wound is located on the Left Achilles. The wound measures 0.7cm length x 0.6cm width x 0.1cm depth; 0.33cm^2 area and 0.033cm^3 volume. The wound is limited to skin breakdown. There is no tunneling or undermining noted. There is a small amount of serous drainage noted. The wound margin is distinct with the outline attached to the wound base. There is large (67-100%) pink granulation within the wound bed. There is no necrotic tissue within the wound bed. The periwound skin appearance exhibited: Localized Edema, Dry/Scaly, Moist, Hemosiderin Staining. The periwound skin appearance did not exhibit: Callus, Crepitus, Excoriation, Fluctuance, Friable, Induration, Rash, Scarring, Maceration, Atrophie  Blanche, Cyanosis, Ecchymosis, Mottled, Pallor, Rubor, Erythema. Periwound temperature was noted as No Abnormality. The periwound has tenderness on palpation. Wound #8 status is Open. Original cause of wound was Not Known. The wound is located on the Right Toe Second. The wound measures 0.1cm length x 0.1cm width x 0.2cm depth; 0.008cm^2 area and 0.002cm^3 volume. The wound is limited to skin breakdown. There is no tunneling or undermining noted. There is a medium amount of purulent drainage noted. The wound margin is flat and intact. There is medium (34-66%) Chlebowski, Cam ([id number]) pink granulation within the wound bed. There is a medium (34-66%) amount of necrotic tissue within the wound bed including Adherent Slough. The periwound skin appearance did not exhibit: Callus, Crepitus, Excoriation, Fluctuance, Friable, Induration, Localized Edema, Rash, Scarring, Dry/Scaly, Maceration, Moist, Atrophie Blanche, Cyanosis, Ecchymosis, Hemosiderin Staining, Mottled, Pallor, Rubor, Erythema. Periwound temperature was noted as No Abnormality. The periwound has tenderness on palpation. 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 I70.235 - Atherosclerosis of native arteries of right leg with ulceration of other part of foot L97.412 - Non-pressure chronic ulcer of right heel and midfoot with fat layer exposed S91.104A - Unspecified open wound of right lesser toe(s) without damage to nail, initial encounter Diagnoses ICD-10 L97.422: Non-pressure chronic ulcer of left heel and midfoot with fat layer exposed I70.244: 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 Arterial insufficiency. Significantly improved with hyperbaric oxygen therapy. New right second toe ulceration. 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 There was a Non- Viable Tissue Open Wound/Selective [phone]) debridement with total area of 0.9 sq cm performed by BURNS III, Erinn Mendosa. with the following instrument(s): Curette to remove Non-Viable tissue/material including Fibrin/Slough after achieving pain control using Lidocaine 4% Topical Solution. A time out was conducted prior to the start of the procedure. There was no bleeding. The procedure was tolerated well with a pain level of 0 throughout and a pain level of 0 following the procedure. Post Debridement Measurements: 1cm length x 1cm width x 0.4cm depth; 0.314cm^3 volume. Wound #5 KLARA, WASHER ([id number]) Wound #5 is an Arterial Insufficiency Ulcer located on the Right Calcaneous . There was a Non-Viable Tissue Open Wound/Selective [phone] debridement  with total area of 0.77 sq cm performed by BURNS III, Banyan Goodchild. with the following instrument(s): Curette to remove Non-Viable tissue/material including Fibrin/Slough after achieving pain control using Lidocaine 4% Topical Solution. A time out was conducted prior to the start of the procedure. There was no bleeding. The procedure was tolerated well with a pain level of 0 throughout and a pain level of 0 following the procedure. Post Debridement Measurements: 1.1cm length x 0.7cm width x 0.4cm depth; 0.242cm^3 volume. Wound #8 Wound #8 is an Arterial Insufficiency Ulcer located on the Right Toe Fourth . There was a Non-Viable Tissue Open Wound/Selective [phone]) debridement with total area of 0.01 sq cm performed by BURNS III, Rubie Ficco. with the following instrument(s): Curette to remove Non-Viable tissue/material including Fibrin/Slough after achieving pain control using Lidocaine 4% Topical Solution.  A time out was conducted prior to the start of the procedure. There was no bleeding. The procedure was tolerated well with a pain level of 0 throughout and a pain level of 0 following the procedure. Post Debridement Measurements: 0.2cm length x 0.2cm width x 0.2cm depth; 0.006cm^3 volume. Plan Wound Cleansing: Wound #1 Left,Lateral Calcaneous: Clean wound with Normal Saline. - be sure to clean out all remaining prisma Wound #5 Right Calcaneous: Clean wound with Normal Saline. - be sure to clean out all remaining prisma Wound #6 Left Achilles: Clean wound with Normal Saline. - be sure to clean out all remaining prisma Wound #8 Right Toe Second: Clean wound with Normal Saline. - be sure to clean out all remaining prisma Anesthetic: Wound #1 Left,Lateral Calcaneous: Topical Lidocaine 4% cream applied to wound bed prior to debridement Hurricaine Topical Anesthetic Spray applied to wound bed prior to debridement Wound #5 Right Calcaneous: Topical Lidocaine 4% cream applied to wound bed prior to  debridement Hurricaine Topical Anesthetic Spray applied to wound bed prior to debridement Wound #6 Left Achilles: Topical Lidocaine 4% cream applied to wound bed prior to debridement Hurricaine Topical Anesthetic Spray applied to wound bed prior to debridement Wound #8 Right Toe Second: Topical Lidocaine 4% cream applied to wound bed prior to debridement Hurricaine Topical Anesthetic Spray applied to wound bed prior to debridement Primary Wound Dressing: Wound #1 Left,Lateral Calcaneous: Prisma Ag Wound #5 Right Calcaneous: Prisma Ag Wynetta Emery, Dareth ([id number]) Wound #6 Left Achilles: Prisma Ag Wound #8 Right Toe Second: Prisma Ag Secondary Dressing: Wound #1 Left,Lateral Calcaneous: ABD and Kerlix/Conform Wound #5 Right Calcaneous: ABD and Kerlix/Conform Wound #6 Left Achilles: ABD and Kerlix/Conform Wound #8 Right Toe Second: ABD and Kerlix/Conform Dressing Change Frequency: Wound #1 Left,Lateral Calcaneous: Change dressing every other day. Wound #5 Right Calcaneous: Change dressing every other day. Wound #6 Left Achilles: Change dressing every other day. Wound #8 Right Toe Second: Change dressing every other day. Follow-up Appointments: Wound #1 Left,Lateral Calcaneous: Return Appointment in 1 week. Wound #5 Right Calcaneous: Return Appointment in 1 week. Wound #6 Left Achilles: Return Appointment in 1 week. Wound #8 Right Toe Second: Return Appointment in 1 week. Off-Loading: Wound #1 Left,Lateral Calcaneous: Other: - SAGE boots while sitting or lying. Do not wear while ambulating. Wound #5 Right Calcaneous: Other: - SAGE boots while sitting or lying. Do not wear while ambulating. Wound #6 Left Achilles: Other: - SAGE boots while sitting or lying. Do not wear while ambulating. Wound #8 Right Toe Second: Other: - SAGE boots while sitting or lying. Do not wear while ambulating. Home Health: Wound #1 Left,Lateral Calcaneous: [place] Visits -  [facility] - please set up xray of right 2nd toe r/t wound with purulent drainage Home Health Nurse may visit PRN to address patient s wound care needs. FACE TO FACE ENCOUNTER: MEDICARE and MEDICAID PATIENTS: I certify that this patient is under my care and that I had a face-to-face encounter that meets the physician face-to-face encounter requirements with this patient on this date. The encounter with the patient was in whole or in part for the following MEDICAL CONDITION: (primary reason for [place]) MEDICAL NECESSITY: I certify, that based on my findings, NURSING services are a medically necessary home health service. HOME BOUND STATUS: I certify that my clinical findings support that this patient is homebound (i.e., Due to [place] ([id number]) illness or injury, pt requires aid of supportive devices such as crutches, cane, wheelchairs, walkers, the use of special  transportation or the assistance of another person to leave their place of residence. There is a normal inability to leave the home and doing so requires considerable and taxing effort. Other absences are for medical reasons / religious services and are infrequent or of short duration when for other reasons). If current dressing causes regression in wound condition, may D/C ordered dressing product/s and apply Normal Saline Moist Dressing daily until next [place] / Other MD appointment. [place] of regression in wound condition at [phone]. Please direct any NON-WOUND related issues/requests for orders to patient's Primary Care Physician Wound #5 Right Calcaneous: [place] Visits - [facility] - please set up xray of right 2nd toe r/t wound with purulent drainage Home Health Nurse may visit PRN to address patient s wound care needs. FACE TO FACE ENCOUNTER: MEDICARE and MEDICAID PATIENTS: I certify that this patient is under my care and that I had a face-to-face encounter that  meets the physician face-to-face encounter requirements with this patient on this date. The encounter with the patient was in whole or in part for the following MEDICAL CONDITION: (primary reason for [place]) MEDICAL NECESSITY: I certify, that based on my findings, NURSING services are a medically necessary home health service. HOME BOUND STATUS: I certify that my clinical findings support that this patient is homebound (i.e., Due to illness or injury, pt requires aid of supportive devices such as crutches, cane, wheelchairs, walkers, the use of special transportation or the assistance of another person to leave their place of residence. There is a normal inability to leave the home and doing so requires considerable and taxing effort. Other absences are for medical reasons / religious services and are infrequent or of short duration when for other reasons). If current dressing causes regression in wound condition, may D/C ordered dressing product/s and apply Normal Saline Moist Dressing daily until next [place] / Other MD appointment. [place] of regression in wound condition at [phone]. Please direct any NON-WOUND related issues/requests for orders to patient's Primary Care Physician Wound #6 Left Achilles: [place] Visits - [facility] - please set up xray of right 2nd toe r/t wound with purulent drainage Home Health Nurse may visit PRN to address patient s wound care needs. FACE TO FACE ENCOUNTER: MEDICARE and MEDICAID PATIENTS: I certify that this patient is under my care and that I had a face-to-face encounter that meets the physician face-to-face encounter requirements with this patient on this date. The encounter with the patient was in whole or in part for the following MEDICAL CONDITION: (primary reason for [place]) MEDICAL NECESSITY: I certify, that based on my findings, NURSING services are a medically necessary home health  service. HOME BOUND STATUS: I certify that my clinical findings support that this patient is homebound (i.e., Due to illness or injury, pt requires aid of supportive devices such as crutches, cane, wheelchairs, walkers, the use of special transportation or the assistance of another person to leave their place of residence. There is a normal inability to leave the home and doing so requires considerable and taxing effort. Other absences are for medical reasons / religious services and are infrequent or of short duration when for other reasons). If current dressing causes regression in wound condition, may D/C ordered dressing product/s and apply Normal Saline Moist Dressing daily until next [place] / Other MD appointment. [place] of regression in wound condition at [phone]. Please direct  any NON-WOUND related issues/requests for orders to patient's Primary Care Physician Wound #8 Right Toe Second: [place] Visits - [facility] - please set up xray of right 2nd toe r/t wound with purulent drainage Home Health Nurse may visit PRN to address patient s wound care needs. FACE TO FACE ENCOUNTER: MEDICARE and MEDICAID PATIENTS: I certify that this patient is under my care and that I had a face-to-face encounter that meets the physician face-to-face encounter requirements with this patient on this date. The encounter with the patient was in whole or in part for the Grover, HYDEIA ([id number]) following MEDICAL CONDITION: (primary reason for [place]) MEDICAL NECESSITY: I certify, that based on my findings, NURSING services are a medically necessary home health service. HOME BOUND STATUS: I certify that my clinical findings support that this patient is homebound (i.e., Due to illness or injury, pt requires aid of supportive devices such as crutches, cane, wheelchairs, walkers, the use of special transportation or the assistance of another person to  leave their place of residence. There is a normal inability to leave the home and doing so requires considerable and taxing effort. Other absences are for medical reasons / religious services and are infrequent or of short duration when for other reasons). If current dressing causes regression in wound condition, may D/C ordered dressing product/s and apply Normal Saline Moist Dressing daily until next [place] / Other MD appointment. [place] of regression in wound condition at [phone]. Please direct any NON-WOUND related issues/requests for orders to patient's Primary Care Physician Hyperbaric Oxygen Therapy: Wound #1 Left,Lateral Calcaneous: Discontinue HBO Therapy - completed HOB therapy Wound #5 Right Calcaneous: Discontinue HBO Therapy - completed HOB therapy Wound #6 Left Achilles: Discontinue HBO Therapy - completed HOB therapy Wound #8 Right Toe Second: Discontinue HBO Therapy - completed [facility] therapy Radiology ordered were: X-ray, toes - right 2nd toe Follow-Up Appointments: A follow-up appointment should be scheduled. A Patient Clinical Summary of Care was provided to [facility] dressing changes. Offloading with Sage boots. X-ray of right second toe to rule out underlying osteomyelitis. Not on any antibiotics at this time. Electronic Signature(s) Signed: 03/28/2015 10:11:44 AM By: Lorine Bears RCP, RRT, CHT Previous Signature: 01/11/2015 4:25:37 PM Version By: Loletha Grayer MD Entered By: Lorine Bears on 03/28/2015 10:11:44 [name] ([id number]) -------------------------------------------------------------------------------- SuperBill Details Patient Name: [name] Date of Service: 01/11/2015 Medical Record Number: [id number] Patient Account Number: [id number] Date of Birth/Sex: 1927-12-17 (81 y.o. 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 I70.235 Atherosclerosis of native arteries of right leg with ulceration of other part of foot 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 S91.104A Unspecified open wound of right lesser toe(s) without damage to nail, initial encounter Facility Procedures CPT4: Description Modifier Quantity Code [id number] - DEBRIDE WOUND 1ST 20 SQ CM OR < 1 ICD-10 Description Diagnosis L97.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 L97.412 Non-pressure chronic ulcer of right heel  and midfoot with fat layer exposed S91.104A Unspecified open wound of right lesser toe(s) without damage to nail, initial encounter Physician Procedures CPT4: Description Modifier Quantity Code [id number] -  WC PHYS LEVEL 3 - EST PT 1 ICD-10 Description Diagnosis S91.104A Unspecified open wound of right lesser toe(s) without damage to nail, initial encounter CPT4: [id number] - WC PHYS DEBR WO ANESTH 20 SQ CM 1 ICD-10 Description Diagnosis L97.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  S91.104A Unspecified open wound of right lesser toe(s) without damage to nail, initial encounter CHAUNTAY, BUER ([id number]) Electronic Signature(s) Signed: 01/11/2015 4:25:37 PM By: Loletha Grayer MD Entered By: Loletha Grayer on 01/11/2015 13:47:19

## 2015-01-18 ENCOUNTER — Ambulatory Visit: Payer: Medicare Other | Admitting: Surgery

## 2015-01-19 ENCOUNTER — Encounter: Payer: Medicare Other | Attending: Surgery | Admitting: Surgery

## 2015-01-19 DIAGNOSIS — S91104A Unspecified open wound of right lesser toe(s) without damage to nail, initial encounter: Secondary | ICD-10-CM | POA: Insufficient documentation

## 2015-01-19 DIAGNOSIS — L97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: Secondary | ICD-10-CM | POA: Diagnosis not present

## 2015-01-19 DIAGNOSIS — L97412 Non-pressure chronic ulcer of right heel and midfoot with fat layer exposed: Secondary | ICD-10-CM | POA: Insufficient documentation

## 2015-01-19 DIAGNOSIS — X58XXXA Exposure to other specified factors, initial encounter: Secondary | ICD-10-CM | POA: Diagnosis not present

## 2015-01-19 DIAGNOSIS — I70235 Atherosclerosis of native arteries of right leg with ulceration of other part of foot: Secondary | ICD-10-CM | POA: Insufficient documentation

## 2015-01-19 DIAGNOSIS — I70244 Atherosclerosis of native arteries of left leg with ulceration of heel and midfoot: Secondary | ICD-10-CM | POA: Insufficient documentation

## 2015-01-19 NOTE — Progress Notes (Signed)
CENIA, FURROW ([id number]) Visit Report for 01/19/2015 Chief Complaint Document Details Patient Name: [name], [name] Date of Service: 01/19/2015 8:15 AM Medical Record Number: [id number] Patient Account Number: [id number] Date of Birth/Sex: 1928-01-31 (81 y.o. Female) Treating RN: Primary Care Physician: Constance Goltz Other Clinician: Referring Physician: Constance Goltz Treating Physician/Extender: Frann Rider in Treatment: 18 Information Obtained from: Patient Chief Complaint Patient presents to the wound care center today with an open arterial ulcer on the left foot big toe and lateral heal. She also has some superficial ulcerations on the second and third toe dorsum. she has no family member with her today and she is a very poor historian and cannot give a proper history. As stated that she walks around with a walker. Electronic Signature(s) Signed: 01/19/2015 12:18:49 PM By: Christin Fudge MD, FACS Entered By: Christin Fudge on 01/19/2015 09:14:21 [name] ([id number]) -------------------------------------------------------------------------------- Debridement Details Patient Name: [name] Date of Service: 01/19/2015 8:15 AM Medical Record Number: [id number] Patient Account Number: [id number] Date of Birth/Sex: 12-04-1927 (81 y.o. Female) Treating RN: Primary Care Physician: Constance Goltz Other Clinician: Referring Physician: Constance Goltz Treating Physician/Extender: Frann Rider in Treatment: 18 Debridement Performed for Wound #1 Left,Lateral Calcaneous Assessment: Performed By: Physician Pat Patrick., MD Debridement: Open Wound/Selective Debridement Selective Description: Pre-procedure Yes Verification/Time Out Taken: Start Time: 08:24 Pain Control: Other : lidocaine 4% Level: Non-Viable Tissue Total Area Debrided (L x 0.8 (cm) x 0.4 (cm) = 0.32 (cm) W): Tissue and other Non-Viable, Eschar, Skin material debrided: Instrument: Forceps,  Scissors Bleeding: None End Time: 08:29 Procedural Pain: 0 Post Procedural Pain: 0 Response to Treatment: Procedure was tolerated well Post Debridement Measurements of Total Wound Length: (cm) 0.8 Width: (cm) 0.4 Depth: (cm) 0.4 Volume: (cm) 0.101 Electronic Signature(s) Signed: 01/19/2015 12:18:49 PM By: Christin Fudge MD, FACS Entered By: Christin Fudge on 01/19/2015 09:13:56 [name] ([id number]) -------------------------------------------------------------------------------- Debridement Details Patient Name: [name] Date of Service: 01/19/2015 8:15 AM Medical Record Number: [id number] Patient Account Number: [id number] Date of Birth/Sex: 07/14/1928 (81 y.o. Female) Treating RN: Primary Care Physician: Constance Goltz Other Clinician: Referring Physician: Constance Goltz Treating Physician/Extender: Frann Rider in Treatment: 18 Debridement Performed for Wound #5 Right Calcaneous Assessment: Performed By: Physician Pat Patrick., MD Debridement: Open Wound/Selective Debridement Selective Description: Pre-procedure Yes Verification/Time Out Taken: Start Time: 08:24 Pain Control: Other : lidocaine 4% Level: Non-Viable Tissue Total Area Debrided (L x 1.5 (cm) x 1 (cm) = 1.5 (cm) W): Tissue and other Non-Viable, Skin material debrided: Instrument: Forceps, Scissors Bleeding: None End Time: 08:29 Procedural Pain: 0 Post Procedural Pain: 0 Response to Treatment: Procedure was tolerated well Post Debridement Measurements of Total Wound Length: (cm) 1.5 Width: (cm) 1 Depth: (cm) 0.3 Volume: (cm) 0.353 Electronic Signature(s) Signed: 01/19/2015 12:18:49 PM By: Christin Fudge MD, FACS Entered By: Christin Fudge on 01/19/2015 09:14:13 [name] ([id number]) -------------------------------------------------------------------------------- HPI Details Patient Name: [name] Date of Service: 01/19/2015 8:15 AM Medical Record Number:  [id number] Patient Account Number: [id number] Date of Birth/Sex: Nov 10, 1927 (81 y.o. Female) Treating RN: Primary Care Physician: Constance Goltz Other Clinician: Referring Physician: Constance Goltz Treating Physician/Extender: Frann Rider in Treatment: 18 History of Present Illness Location: left heel Quality: Patient reports experiencing a dull pain to affected area(s). Severity: Patient states wound are getting worse. Duration: Patient states that they are not certain how long the wound has been present. Timing: Pain in wound is Intermittent (comes and goes Context: The wound appeared gradually over time Associated Signs and Symptoms: Patient reports  having difficulty standing for long periods. HPI Description: This 81 year old patient is a poor historian and comes from a nursing home. Does not have any family members with her. Says she has a ulcer on the lateral part of her left foot and some new ones are there on her right foot too. She is unable to say how long she's had these. she does walk with a walker and this is limited most of the day. Does not recall if she has had any vascular studies done. I have reviewed an x-ray done of the left foot which basically shows osteoporosis but no evidence of fracture dislocation or osteomyelitis. 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 She seems to be doing better overall. 10/19/14 -- I understand she has gone to the vascular surgery and lab yesterday and has had all her tests done yesterday. She is doing fine otherwise and has had no fresh issues. 10/26/2014 she seems to be doing well and has no fresh problems and seems much brighter. 10/12/14 -- Taking her antibiotic and continues to do her dressings locally and she is helped by her skilled nursing. She seems to be  doing fine and has no fresh issues. 10/19/14 -- she is doing fine and has had no fresh issues. She says her son to go for her vascular workup yesterday and she has been called back after 3 months. we will try and gather these reports to review what they said. 10/26/14 --I have been able to review notes from the vascular surgery office and these were dated from 10/18/2014. He patient was on her first postop visit and had Doppler ultrasounds done of her arteries. She had more than 50% stenosis of the right superficial femoral artery and more than 50% stenosis in the left tibioperoneal trunk. She was status post a left lower extremity angiogram with angioplasty of the peroneal and left superficial femoral arteries for a nonhealing left foot and ankle ulceration. note is made of the fact that the ABIs had not changed in the last month since her previous visit. The opinion of her provider was that she did not need any intervention at this time and they had done everything they could at the present time. They would continue on Plavix and antibiotics and see her back in 3 months for a another arterial duplex study of the lower extremities. [name], [name] ([id number]) 11/02/2014 -- she has spoken to her caregivers about coming for HBO 5 times per week for 6-8 weeks and they are agreeable about this and she is willing to undergo hyperbaric oxygen therapy. 11/16/2014 she has been worked up with a EKG and chest x-ray and these were within normal limits. As far as the investigations go she is ready for HBOT. we are awaiting some insurance clearance so that she can start on her hyperbaric oxygen therapy. 11/30/2014 she tolerated hyperbaric oxygen therapy fine and there were no issues with her blood glucose levels. As noted she is not a diabetic and most of her fingerstick blood glucose levels are inaccurate because of the Raynaud's phenomena. She normally has to have your lobe sticks to get any random glucose  levels. I have also requested her primary care does a venous blood glucose draw to check her glucose level. 12/21/2014 -- she is tolerating hyperbaric oxygen very well and has overall  made a lot of progress. Her left leg is looking much better. 01/11/2015 - No new complaints today. New ulceration on the dorsum of the right second toe noted today on physical exam. Patient unaware. No significant pain. No fever or chills. No significant drainage. Offloading with sage boots at night. Significantly improved with HBO. 01/19/2015 - the patient has had a x-ray of the right foot done at the nursing home but we still do not have the report and we are awaiting this result. She is otherwise doing fine with her general health. Electronic Signature(s) Signed: 01/19/2015 12:18:49 PM By: Christin Fudge MD, FACS Entered By: Christin Fudge on 01/19/2015 09:15:50 [name] ([id number]) -------------------------------------------------------------------------------- Physical Exam Details Patient Name: [name] Date of Service: 01/19/2015 8:15 AM Medical Record Number: [id number] Patient Account Number: [id number] Date of Birth/Sex: 1927/12/09 (81 y.o. Female) Treating RN: Primary Care Physician: Constance Goltz Other Clinician: Referring Physician: Constance Goltz Treating Physician/Extender: Frann Rider in Treatment: 18 Constitutional . Pulse regular. Respirations normal and unlabored. Afebrile. . Eyes Nonicteric. Reactive to light. Ears, Nose, Mouth, and Throat Lips, teeth, and gums WNL.Marland Kitchen Moist mucosa without lesions . Neck supple and nontender. No palpable supraclavicular or cervical adenopathy. Normal sized without goiter. Respiratory WNL. No retractions.. Cardiovascular Pedal Pulses WNL. No clubbing, cyanosis or edema. Integumentary (Hair, Skin) the ulceration on the left Achilles tendon and the left lateral calcaneal part looks much cleaner and there is minimal slough. Ulceration on  the right heel posteriorly has some slough and need some sharp debridement of surrounding dead skin and eschar.. No crepitus or fluctuance. No peri-wound warmth or erythema. No masses.Marland Kitchen Psychiatric Judgement and insight Intact.. No evidence of depression, anxiety, or agitation.. Electronic Signature(s) Signed: 01/19/2015 12:18:49 PM By: Christin Fudge MD, FACS Entered By: Christin Fudge on 01/19/2015 09:17:29 [name] ([id number]) -------------------------------------------------------------------------------- Physician Orders Details Patient Name: [name] Date of Service: 01/19/2015 8:15 AM Medical Record Number: [id number] Patient Account Number: [id number] Date of Birth/Sex: Jan 06, 1928 (81 y.o. Female) Treating RN: Cornell Barman Primary Care Physician: Constance Goltz Other Clinician: Referring Physician: Constance Goltz Treating Physician/Extender: Frann Rider in Treatment: 66 Verbal / Phone Orders: Yes Clinician: Cornell Barman Read Back and Verified: Yes Diagnosis Coding Wound Cleansing Wound #1 Left,Lateral Calcaneous o Clean wound with Normal Saline. - be sure to clean out all remaining prisma Wound #5 Right Calcaneous o Clean wound with Normal Saline. - be sure to clean out all remaining prisma Wound #6 Left Achilles o Clean wound with Normal Saline. - be sure to clean out all remaining prisma Anesthetic Wound #1 Left,Lateral Calcaneous o Topical Lidocaine 4% cream applied to wound bed prior to debridement o Hurricaine Topical Anesthetic Spray applied to wound bed prior to debridement Wound #5 Right Calcaneous o Topical Lidocaine 4% cream applied to wound bed prior to debridement o Hurricaine Topical Anesthetic Spray applied to wound bed prior to debridement Wound #6 Left Achilles o Topical Lidocaine 4% cream applied to wound bed prior to debridement o Hurricaine Topical Anesthetic Spray applied to wound bed prior to debridement Primary Wound  Dressing Wound #1 Left,Lateral Calcaneous o Prisma Ag Wound #5 Right Calcaneous o Prisma Ag Wound #6 Left Achilles o Prisma Ag Secondary Dressing Wound #1 Left,Lateral Calcaneous o ABD and Kerlix/Conform [place], I[place] ([id number]) Wound #5 Right Calcaneous o ABD and Kerlix/Conform Wound #6 Left Achilles o ABD and Kerlix/Conform Dressing Change Frequency Wound #1 Left,Lateral Calcaneous o Change dressing every other day. Wound #5 Right Calcaneous o Change dressing every other day.  Wound #6 Left Achilles o Change dressing every other day. Follow-up Appointments Wound #1 Left,Lateral Calcaneous o Return Appointment in 1 week. Wound #5 Right Calcaneous o Return Appointment in 1 week. Wound #6 Left Achilles o Return Appointment in 1 week. Off-Loading Wound #1 Left,Lateral Calcaneous o Other: - SAGE boots while sitting or lying. Do not wear while ambulating. Wound #5 Right Calcaneous o Other: - SAGE boots while sitting or lying. Do not wear while ambulating. Wound #6 Left Achilles o Other: - SAGE boots while sitting or lying. Do not wear while ambulating. Home Health Wound #1 [place] Visits o Home Health Nurse may visit PRN to address patientos wound care needs. o FACE TO FACE ENCOUNTER: MEDICARE and MEDICAID PATIENTS: I certify that this patient is under my care and that I had a face-to-face encounter that meets the physician face-to-face encounter requirements with this patient on this date. The encounter with the patient was in whole or in part for the following MEDICAL CONDITION: (primary reason for [place]) MEDICAL NECESSITY: I certify, that based on my findings, NURSING services are a medically necessary home health service. HOME BOUND STATUS: I certify that my clinical findings support that this patient is homebound (i.e., Due to illness or injury, pt requires aid of [name], [name]  ([id number]) supportive devices such as crutches, cane, wheelchairs, walkers, the use of special transportation or the assistance of another person to leave their place of residence. There is a normal inability to leave the home and doing so requires considerable and taxing effort. Other absences are for medical reasons / religious services and are infrequent or of short duration when for other reasons). o If current dressing causes regression in wound condition, may D/C ordered dressing product/s and apply Normal Saline Moist Dressing daily until next [place] / Other MD appointment. [place] of regression in wound condition at [phone]. o Please direct any NON-WOUND related issues/requests for orders to patient's Primary Care Physician Wound #5 Right [place] Nurse may visit PRN to address patientos wound care needs. o FACE TO FACE ENCOUNTER: MEDICARE and MEDICAID PATIENTS: I certify that this patient is under my care and that I had a face-to-face encounter that meets the physician face-to-face encounter requirements with this patient on this date. The encounter with the patient was in whole or in part for the following MEDICAL CONDITION: (primary reason for [place]) MEDICAL NECESSITY: I certify, that based on my findings, NURSING services are a medically necessary home health service. HOME BOUND STATUS: I certify that my clinical findings support that this patient is homebound (i.e., Due to illness or injury, pt requires aid of supportive devices such as crutches, cane, wheelchairs, walkers, the use of special transportation or the assistance of another person to leave their place of residence. There is a normal inability to leave the home and doing so requires considerable and taxing effort. Other absences are for medical reasons / religious services and are infrequent or of short  duration when for other reasons). o If current dressing causes regression in wound condition, may D/C ordered dressing product/s and apply Normal Saline Moist Dressing daily until next [place] / Other MD appointment. [place] of regression in wound condition at [phone]. o Please direct any NON-WOUND related issues/requests for orders to patient's Primary Care Physician Wound #6 Left Achilles o [place] Nurse [phone]  visit PRN to address patientos wound care needs. o FACE TO FACE ENCOUNTER: MEDICARE and MEDICAID PATIENTS: I certify that this patient is under my care and that I had a face-to-face encounter that meets the physician face-to-face encounter requirements with this patient on this date. The encounter with the patient was in whole or in part for the following MEDICAL CONDITION: (primary reason for [place]) MEDICAL NECESSITY: I certify, that based on my findings, NURSING services are a medically necessary home health service. HOME BOUND STATUS: I certify that my clinical findings support that this patient is homebound (i.e., Due to illness or injury, pt requires aid of supportive devices such as crutches, cane, wheelchairs, walkers, the use of special transportation or the assistance of another person to leave their place of residence. There is a normal inability to leave the home and doing so requires considerable and taxing effort. Other absences are for medical reasons / religious services and are infrequent or of short duration when for other reasons). o If current dressing causes regression in wound condition, may D/C ordered dressing product/s and apply Normal Saline Moist Dressing daily until next [place] / Other MD appointment. [place] of regression in wound condition at [phone]. [name], [name] ([id number]) o Please direct any NON-WOUND related  issues/requests for orders to patient's Primary Care Physician Electronic Signature(s) Signed: 01/19/2015 12:18:49 PM By: Christin Fudge MD, FACS Signed: 01/19/2015 1:28:45 PM By: Gretta Cool RN, BSN, Kim RN, BSN Entered By: Gretta Cool, RN, BSN, Kim on 01/19/2015 08:34:56 [name] ([id number]) -------------------------------------------------------------------------------- Problem List Details Patient Name: [name] Date of Service: 01/19/2015 8:15 AM Medical Record Number: [id number] Patient Account Number: [id number] Date of Birth/Sex: [birth year] (81 y.o. 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/26/2014 Yes fat layer exposed S91.104A Unspecified open wound of right lesser toe(s) without 01/11/2015 Yes damage to nail, initial encounter Inactive Problems Resolved Problems Electronic Signature(s) Signed: 01/19/2015 12:18:49 PM By: Christin Fudge MD, FACS Entered By: Christin Fudge on 01/19/2015 09:13:06 [name] ([id number]) -------------------------------------------------------------------------------- Progress Note Details Patient Name: [name] Date of Service: 01/19/2015 8:15 AM Medical Record Number: [id number] Patient Account Number: [id number] Date of Birth/Sex: [birth year] (81 y.o. Female) Treating RN: Primary Care Physician: Constance Goltz Other Clinician: Referring Physician: Constance Goltz Treating Physician/Extender: Frann Rider in Treatment: 18 Subjective Chief Complaint Information obtained from Patient Patient presents to the wound care center today with an open arterial ulcer on the left foot big toe and lateral heal. She also has some superficial ulcerations on the second and third toe dorsum. she has no family member with her today and she is a very poor historian and cannot give a proper history. As stated that she walks around with a walker. History of Present Illness (HPI) The following HPI elements were documented for the patient's wound: Location: left heel Quality: Patient reports experiencing a dull pain to affected area(s). Severity: Patient states wound are getting worse. Duration: Patient states that they are not certain how long the wound has been  present. Timing: Pain in wound is Intermittent (comes and goes Context: The wound appeared gradually over time Associated Signs and Symptoms: Patient reports having difficulty standing for long periods. This 81 year old patient is a poor historian and comes from a nursing home. Does not have any family members with her. Says she has a ulcer on the lateral part of her left foot and some new ones are there on her right foot too. She is unable to say how long she's had these. she does walk with a walker and this is limited most of the day. Does not recall if she has had any vascular studies done. I have reviewed an x-ray done of the left foot which basically shows osteoporosis but no evidence of fracture dislocation or osteomyelitis. 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 She seems to be doing better overall. 10/19/14 -- I understand she has gone to the vascular surgery and lab yesterday and has had all her tests done yesterday.  She is doing fine otherwise and has had no fresh issues. 10/26/2014 she seems to be doing well and has no fresh problems and seems much brighter. 10/12/14 -- Taking her antibiotic and continues to do her dressings locally and she is helped by her skilled nursing. She seems to be doing fine and has no fresh issues. 10/19/14 -- she is doing fine and has had no fresh issues. She says her son to go for her vascular workup yesterday and she has been called back after 3 months. we will try and gather these reports to review what [name], [name] ([id number]) they said. 10/26/14 --I have been able to review notes from the vascular surgery office and these were dated from 10/18/2014. He patient was on her first postop visit and had Doppler ultrasounds done of her arteries. She had more than 50% stenosis of the right superficial femoral artery and more than 50% stenosis in the left tibioperoneal trunk. She was status post a left lower extremity angiogram with angioplasty of the peroneal and left superficial femoral arteries for a nonhealing left foot and ankle ulceration. note is made of the fact that the ABIs had not changed in the last month since her previous visit. The opinion of her provider was that she did not need any intervention at this time and they had done everything they could at the present time. They would continue on Plavix and antibiotics and see her back in 3 months for a another arterial duplex study of the lower extremities. 11/02/2014 -- she has spoken to her caregivers about coming for HBO 5 times per week for 6-8 weeks and they are agreeable about this and she is willing to undergo hyperbaric oxygen therapy. 11/16/2014 she has been worked up with a EKG and chest x-ray and these were within normal limits. As far as the investigations go she is ready for HBOT. we are awaiting some insurance clearance so that she can start on her hyperbaric oxygen therapy. 11/30/2014 she tolerated  hyperbaric oxygen therapy fine and there were no issues with her blood glucose levels. As noted she is not a diabetic and most of her fingerstick blood glucose levels are inaccurate because of the Raynaud's phenomena. She normally has to have your lobe sticks to get any random glucose levels. I have also requested her primary care does a  venous blood glucose draw to check her glucose level. 12/21/2014 -- she is tolerating hyperbaric oxygen very well and has overall made a lot of progress. Her left leg is looking much better. 01/11/2015 - No new complaints today. New ulceration on the dorsum of the right second toe noted today on physical exam. Patient unaware. No significant pain. No fever or chills. No significant drainage. Offloading with sage boots at night. Significantly improved with HBO. 01/19/2015 - the patient has had a x-ray of the right foot done at the nursing home but we still do not have the report and we are awaiting this result. She is otherwise doing fine with her general health. Objective Constitutional Pulse regular. Respirations normal and unlabored. Afebrile. Vitals Time Taken: 8:12 AM, Height: 64 in, Weight: 180 lbs, BMI: 30.9, Temperature: 99.5 F, Pulse: 75 bpm, Respiratory Rate: 18 breaths/min, Blood Pressure: 130/55 mmHg. [place], [name] ([id number]) Eyes Nonicteric. Reactive to light. Ears, Nose, Mouth, and Throat Lips, teeth, and gums WNL.Marland Kitchen Moist mucosa without lesions . Neck supple and nontender. No palpable supraclavicular or cervical adenopathy. Normal sized without goiter. Respiratory WNL. No retractions.. Cardiovascular Pedal Pulses WNL. No clubbing, cyanosis or edema. Psychiatric Judgement and insight Intact.. No evidence of depression, anxiety, or agitation.. Integumentary (Hair, Skin) the ulceration on the left Achilles tendon and the left lateral calcaneal part looks much cleaner and there is minimal slough. Ulceration on the right heel posteriorly  has some slough and need some sharp debridement of surrounding dead skin and eschar.. No crepitus or fluctuance. No peri-wound warmth or erythema. No masses.. Wound #1 status is Open. Original cause of wound was Pressure Injury. The wound is located on the Left,Lateral Calcaneous. The wound measures 1cm length x 0.8cm width x 0.4cm depth; 0.628cm^2 area and 0.251cm^3 volume. The wound is limited to skin breakdown. There is no tunneling noted. There is a small amount of serous drainage noted. The wound margin is distinct with the outline attached to the wound base. There is large (67-100%) pink granulation within the wound bed. There is a small (1-33%) amount of necrotic tissue within the wound bed including Adherent Slough. The periwound skin appearance exhibited: Localized Edema, Moist. The periwound skin appearance did not exhibit: Callus, Crepitus, Excoriation, Fluctuance, Friable, Induration, Rash, Scarring, Dry/Scaly, Maceration, Atrophie Blanche, Cyanosis, Ecchymosis, Hemosiderin Staining, Mottled, Pallor, Rubor, Erythema. Periwound temperature was noted as No Abnormality. The periwound has tenderness on palpation. Wound #5 status is Open. Original cause of wound was Gradually Appeared. The wound is located on the Right Calcaneous. The wound measures 1.5cm length x 1cm width x 0.3cm depth; 1.178cm^2 area and 0.353cm^3 volume. The wound is limited to skin breakdown. There is no tunneling or undermining noted. There is a small amount of serosanguineous drainage noted. The wound margin is distinct with the outline attached to the wound base. There is large (67-100%) pink granulation within the wound bed. There is a small (1-33%) amount of necrotic tissue within the wound bed including Adherent Slough. The periwound skin appearance exhibited: Localized Edema, Dry/Scaly, Moist, Hemosiderin Staining. The periwound skin appearance did not exhibit: Callus, Crepitus, Excoriation, Fluctuance,  Friable, Induration, Rash, Scarring, Maceration, Atrophie Blanche, Cyanosis, Ecchymosis, Mottled, Pallor, Rubor, Erythema. Periwound temperature was noted as No Abnormality. The periwound has tenderness on palpation. Wound #6 status is Open. Original cause of wound was Gradually Appeared. The wound is located on the Left Achilles. The wound measures 1.2cm length x 1.2cm width x 0.1cm depth; 1.131cm^2 area and 0.113cm^3 volume. The wound is  limited to skin breakdown. There is a small amount of serous drainage [name], [name] ([id number]) noted. The wound margin is distinct with the outline attached to the wound base. There is large (67-100%) pink granulation within the wound bed. There is no necrotic tissue within the wound bed. The periwound skin appearance exhibited: Localized Edema, Dry/Scaly, Moist, Hemosiderin Staining. The periwound skin appearance did not exhibit: Callus, Crepitus, Excoriation, Fluctuance, Friable, Induration, Rash, Scarring, Maceration, Atrophie Blanche, Cyanosis, Ecchymosis, Mottled, Pallor, Rubor, Erythema. Periwound temperature was noted as No Abnormality. The periwound has tenderness on palpation. Wound #8 status is Open. Original cause of wound was Not Known. The wound is located on the Right Toe Second. The wound measures 0cm length x 0cm width x 0cm depth; 0cm^2 area and 0cm^3 volume. 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 The ulceration on the right second toe dorsum was possibly a minor abrasion because it is completely healed and there is an eschar over this which I have not been able to remove. We will await this x-ray report. She will come back and see [place] next week. 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 There was a Non- Viable Tissue Open Wound/Selective [phone]) debridement with total area of 0.32 sq cm performed by Jerriyah Louis, Jackson Latino., MD. with the following instrument(s): Forceps and Scissors to remove Non-Viable tissue/material including Eschar and Skin after achieving pain control using Other (lidocaine 4%). A time out was conducted prior to the start of the procedure. There was no bleeding. The procedure was tolerated well with a pain level of 0 throughout and a pain level of 0 following the procedure. Post Debridement Measurements: 0.8cm length x 0.4cm width x 0.4cm depth; 0.101cm^3 volume. Wound #5 [name], [name] ([id number]) Wound #5 is an Arterial Insufficiency Ulcer located on the Right Calcaneous . There was a Non-Viable Tissue Open Wound/Selective [phone]) debridement with total area of 1.5 sq cm performed by Mahamadou Weltz, Jackson Latino., MD. with the following instrument(s): Forceps and Scissors to remove Non-Viable tissue/material including Skin after achieving pain control using Other (lidocaine 4%). A time out was conducted prior to the start of the procedure. There was no bleeding. The procedure was tolerated well with a pain level of 0 throughout and a pain level of 0 following the procedure. Post Debridement Measurements: 1.5cm length x 1cm width x 0.3cm depth; 0.353cm^3 volume. Plan Wound Cleansing: Wound #1 Left,Lateral Calcaneous: Clean wound with Normal Saline. - be sure to clean out all remaining prisma Wound #5 Right Calcaneous: Clean wound with Normal Saline. - be sure to clean out all remaining  prisma Wound #6 Left Achilles: Clean wound with Normal Saline. - be sure to clean out all remaining prisma Anesthetic: Wound #1 Left,Lateral Calcaneous: Topical Lidocaine 4% cream applied to wound bed prior to debridement Hurricaine Topical Anesthetic Spray applied to wound bed prior to debridement Wound #5 Right Calcaneous: Topical Lidocaine 4% cream applied to wound bed prior to  debridement Hurricaine Topical Anesthetic Spray applied to wound bed prior to debridement Wound #6 Left Achilles: Topical Lidocaine 4% cream applied to wound bed prior to debridement Hurricaine Topical Anesthetic Spray applied to wound bed prior to debridement Primary Wound Dressing: Wound #1 Left,Lateral Calcaneous: Prisma Ag Wound #5 Right Calcaneous: Prisma Ag Wound #6 Left Achilles: Prisma Ag Secondary Dressing: Wound #1 Left,Lateral Calcaneous: ABD and Kerlix/Conform Wound #5 Right Calcaneous: ABD and Kerlix/Conform Wound #6 Left Achilles: ABD and Kerlix/Conform Dressing Change Frequency: Wound #1 Left,Lateral Calcaneous: Change dressing every other day. Wound #5 Right Calcaneous: Change dressing every other day. [place], [place] ([id number]) Wound #6 Left Achilles: Change dressing every other day. Follow-up Appointments: Wound #1 Left,Lateral Calcaneous: Return Appointment in 1 week. Wound #5 Right Calcaneous: Return Appointment in 1 week. Wound #6 Left Achilles: Return Appointment in 1 week. Off-Loading: Wound #1 Left,Lateral Calcaneous: Other: - SAGE boots while sitting or lying. Do not wear while ambulating. Wound #5 Right Calcaneous: Other: - SAGE boots while sitting or lying. Do not wear while ambulating. Wound #6 Left Achilles: Other: - SAGE boots while sitting or lying. Do not wear while ambulating. Home Health: Wound #1 Left,Lateral Calcaneous: [place] Nurse may visit PRN to address patient s wound care needs. FACE TO FACE ENCOUNTER: MEDICARE  and MEDICAID PATIENTS: I certify that this patient is under my care and that I had a face-to-face encounter that meets the physician face-to-face encounter requirements with this patient on this date. The encounter with the patient was in whole or in part for the following MEDICAL CONDITION: (primary reason for [place]) MEDICAL NECESSITY: I certify, that based on my findings, NURSING services are a medically necessary home health service. HOME BOUND STATUS: I certify that my clinical findings support that this patient is homebound (i.e., Due to illness or injury, pt requires aid of supportive devices such as crutches, cane, wheelchairs, walkers, the use of special transportation or the assistance of another person to leave their place of residence. There is a normal inability to leave the home and doing so requires considerable and taxing effort. Other absences are for medical reasons / religious services and are infrequent or of short duration when for other reasons). If current dressing causes regression in wound condition, may D/C ordered dressing product/s and apply Normal Saline Moist Dressing daily until next [place] / Other MD appointment. [place] of regression in wound condition at [phone]. Please direct any NON-WOUND related issues/requests for orders to patient's Primary Care Physician Wound #5 Right Calcaneous: [place] Nurse may visit PRN to address patient s wound care needs. FACE TO FACE ENCOUNTER: MEDICARE and MEDICAID PATIENTS: I certify that this patient is under my care and that I had a face-to-face encounter that meets the physician face-to-face encounter requirements with this patient on this date. The encounter with the patient was in whole or in part for the following MEDICAL CONDITION: (primary reason for [place]) MEDICAL NECESSITY: I certify, that based on my findings, NURSING services  are a medically necessary home health service. HOME BOUND STATUS: I certify that my clinical findings support that this patient is homebound (i.e., Due to illness or injury, pt requires aid of supportive devices such as crutches, cane, wheelchairs, walkers, the use of special transportation or the assistance of another person to leave their place of residence. There is a normal inability to leave the home and doing so requires considerable and taxing  effort. Other absences are for medical reasons / religious services and are infrequent or of short duration when for other reasons). If current dressing causes regression in wound condition, may D/C ordered dressing product/s and apply Normal Saline Moist Dressing daily until next [place] / Other MD appointment. [place] of regression in wound condition at [phone]. Please direct any NON-WOUND related issues/requests for orders to patient's Primary Care Physician [name], [name] ([id number]) Wound #6 Left Achilles: [place] Nurse may visit PRN to address patient s wound care needs. FACE TO FACE ENCOUNTER: MEDICARE and MEDICAID PATIENTS: I certify that this patient is under my care and that I had a face-to-face encounter that meets the physician face-to-face encounter requirements with this patient on this date. The encounter with the patient was in whole or in part for the following MEDICAL CONDITION: (primary reason for [place]) MEDICAL NECESSITY: I certify, that based on my findings, NURSING services are a medically necessary home health service. HOME BOUND STATUS: I certify that my clinical findings support that this patient is homebound (i.e., Due to illness or injury, pt requires aid of supportive devices such as crutches, cane, wheelchairs, walkers, the use of special transportation or the assistance of another person to leave their place of residence. There is a normal  inability to leave the home and doing so requires considerable and taxing effort. Other absences are for medical reasons / religious services and are infrequent or of short duration when for other reasons). If current dressing causes regression in wound condition, may D/C ordered dressing product/s and apply Normal Saline Moist Dressing daily until next [place] / Other MD appointment. [place] of regression in wound condition at [phone]. Please direct any NON-WOUND related issues/requests for orders to patient's Primary Care Physician We will continue with Prisma over all the wounds and she will continue to use her stage boots. The ulceration on the right second toe dorsum was possibly a minor abrasion because it is completely healed and there is an eschar over this which I have not been able to remove. We will await this x-ray report. She will come back and see [place] next week. Electronic Signature(s) Signed: 01/19/2015 12:18:49 PM By: Christin Fudge MD, FACS Entered By: Christin Fudge on 01/19/2015 09:18:49 [name] ([id number]) -------------------------------------------------------------------------------- SuperBill Details Patient Name: [name] Date of Service: 01/19/2015 Medical Record Number: [id number] Patient Account Number: [id number] Date of Birth/Sex: 1928-05-23 (81 y.o. 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 I70.235 Atherosclerosis of native arteries of right leg with ulceration of other part of foot L97.412 Non-pressure chronic ulcer of right heel and midfoot with fat layer  exposed S91.104A Unspecified open wound of right lesser toe(s) without damage to nail, initial encounter Facility Procedures CPT4: Description Modifier Quantity Code [id number] - DEBRIDE WOUND 1ST 20 SQ CM OR < 1 ICD-10 Description Diagnosis 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  leg with ulceration of heel and midfoot I70.235 Atherosclerosis of native arteries of right leg with ulceration of other part of foot L97.412 Non-pressure chronic ulcer of right heel and midfoot with  fat layer exposed Physician Procedures CPT4: Description Modifier Quantity Code [id number] - WC PHYS DEBR WO ANESTH 20 SQ CM 1 ICD-10 Description Diagnosis 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

## 2015-01-20 NOTE — Progress Notes (Signed)
[name], [name] ([id number]) Visit Report for 01/19/2015 Arrival Information Details Patient Name: [name], [name] Date of Service: 01/19/2015 8:15 AM Medical Record Number: [id number] Patient Account Number: [id number] Date of Birth/Sex: Dec 25, 1927 (81 y.o. Female) Treating RN: Afful, RN, BSN, Velva Harman Primary Care Physician: Constance Goltz Other Clinician: Referring Physician: Constance Goltz Treating Physician/Extender: Frann Rider in Treatment: 18 Visit Information History Since Last Visit Any new allergies or adverse reactions: No Patient Arrived: Wheel Chair Had a fall or experienced change in No Arrival Time: 08:10 activities of daily living that may affect Accompanied By: self risk of falls: Transfer Assistance: None Signs or symptoms of abuse/neglect since last No Patient Identification Verified: Yes visito Secondary Verification Process Yes Hospitalized since last visit: No Completed: Has Dressing in Place as Prescribed: Yes Patient Requires Transmission- No Pain Present Now: No Based Precautions: Patient Has Alerts: Yes Patient Alerts: Patient on Blood Thinner DMII Plavix ABI L 0.63 R 0.79 Electronic [name](s) Signed: 01/19/2015 4:29:11 PM By: Regan Lemming BSN, RN Entered By: Regan Lemming on 01/19/2015 08:18:46 [name] ([id number]) -------------------------------------------------------------------------------- Encounter Discharge Information Details Patient Name: [name] Date of Service: 01/19/2015 8:15 AM Medical Record Number: [id number] Patient Account Number: [id number] Date of Birth/Sex: 09/06/1927 (81 y.o. Female) Treating RN: Baruch Gouty, RN, BSN, Velva Harman Primary Care Physician: Constance Goltz Other Clinician: Referring Physician: Constance Goltz Treating Physician/Extender: Frann Rider in Treatment: 60 Encounter Discharge Information Items Discharge Pain Level: 0 Discharge Condition: Stable Ambulatory Status: Wheelchair Discharge  Destination: Home Transportation: Private Auto Accompanied By: self Schedule Follow-up Appointment: No Medication Reconciliation completed No and [name] to Patient/Care [name]: [name](s) Signed: 01/19/2015 4:29:11 PM By: Regan Lemming BSN, RN Previous [name]: 01/19/2015 8:38:58 AM Version By: Ruthine Dose Entered By: Regan Lemming on 01/19/2015 08:58:09 [name] ([id number]) -------------------------------------------------------------------------------- Lower Extremity Assessment Details Patient Name: [name] Date of Service: 01/19/2015 8:15 AM Medical Record Number: [id number] Patient Account Number: [id number] Date of Birth/Sex: [birth year] (81 y.o. Female) Treating RN: Afful, RN, BSN, Velva Harman Primary Care Physician: Constance Goltz Other Clinician: Referring Physician: Constance Goltz Treating Physician/Extender: Frann Rider in Treatment: 18 Vascular Assessment Pulses: Posterior Tibial Dorsalis Pedis Palpable: [Left:Yes] [Right:Yes] Extremity colors, hair growth, and conditions: Extremity Color: [Left:Dusky] [Right:Dusky] Hair Growth on Extremity: [Left:No] [Right:No] Temperature of Extremity: [Left:Warm] [Right:Warm] Capillary Refill: [Left:< 3 seconds] [Right:< 3 seconds] Dependent Rubor: [Left:No] [Right:No] Blanched when Elevated: [Left:No] [Right:No] Lipodermatosclerosis: [Left:No] [Right:No] Toe Nail Assessment Left: Right: Thick: Yes Yes Discolored: No No Deformed: No No Improper Length and Hygiene: No No Electronic [name](s) Signed: 01/19/2015 4:29:11 PM By: Regan Lemming BSN, RN Entered By: Regan Lemming on 01/19/2015 08:19:41 [name] ([id number]) -------------------------------------------------------------------------------- Multi Wound Chart Details Patient Name: [name] Date of Service: 01/19/2015 8:15 AM Medical Record Number:  [id number] Patient Account Number: [id number] Date of Birth/Sex: 08-22-1927 (81 y.o. Female) Treating RN: Cornell Barman Primary Care Physician: Constance Goltz Other Clinician: Referring Physician: Constance Goltz Treating Physician/Extender: Frann Rider in Treatment: 18 Vital Signs Height(in): 64 Pulse(bpm): 75 Weight(lbs): 180 Blood Pressure 130/55 (mmHg): Body Mass Index(BMI): 31 Temperature(F): 99.5 Respiratory Rate 18 (breaths/min): Photos: [1:No Photos] [5:No Photos] [6:No Photos] Wound Location: [1:Left Calcaneous - Lateral Right Calcaneous] [6:Left Achilles] Wounding Event: [1:Pressure Injury] [5:Gradually Appeared] [6:Gradually Appeared] Primary Etiology: [1:Arterial Insufficiency Ulcer Arterial Insufficiency Ulcer Arterial Insufficiency Ulcer] Secondary Etiology: [1:Pressure Ulcer] [5:N/A] [6:N/A] Comorbid History: [1:Glaucoma, Asthma, Hypertension, Peripheral Hypertension, Peripheral Hypertension, Peripheral Venous Disease, Type II Venous Disease, Type II  Venous Disease, Type II Diabetes] [5:Glaucoma, Asthma, Diabetes] [6:Glaucoma, Asthma,  Diabetes] Date Acquired: [1:07/24/2014] [5:09/26/2014] [6:09/26/2014] Weeks of Treatment: [1:18] [5:15] [6:15] Wound Status: [1:Open] [5:Open] [6:Open] Measurements L x W x D 1x0.8x0.4 [5:1.5x1x0.3] [6:1.2x1.2x0.1] (cm) Area (cm) : [1:0.628] [5:1.178] [6:1.131] Volume (cm) : [1:0.251] [5:0.353] [6:0.113] % Reduction in Area: [1:87.70%] [5:78.60%] [6:70.00%] % Reduction in Volume: 87.70% [5:35.80%] [6:94.00%] Classification: [1:Partial Thickness] [5:Full Thickness Without Exposed Support Structures] [6:Partial Thickness] HBO Classification: [1:Grade 1] [5:Grade 1] [6:Grade 1] Exudate Amount: [1:Small] [5:Small] [6:Small] Exudate Type: [1:Serous] [5:Serosanguineous] [6:Serous] Exudate Color: [1:amber] [5:red, brown] [6:amber] Wound Margin: [1:Distinct, outline attached Distinct, outline attached Distinct, outline  attached] Granulation Amount: [1:Large (67-100%)] [5:Large (67-100%)] [6:Large (67-100%)] Granulation Quality: [1:Pink] [5:Pink] [6:Pink] Necrotic Amount: [1:Small (1-33%)] [5:Small (1-33%)] [6:None Present (0%)] Exposed Structures: [place], Ronnie Doss ([id number]) Fascia: No Fascia: No Fascia: No Fat: No Fat: No Fat: No Tendon: No Tendon: No Tendon: No Muscle: No Muscle: No Muscle: No Joint: No Joint: No Joint: No Bone: No Bone: No Bone: No Limited to Skin Limited to Skin Limited to Skin Breakdown Breakdown Breakdown Epithelialization: Medium (34-66%) Medium (34-66%) Medium (34-66%) Periwound Skin Texture: Edema: Yes Edema: Yes Edema: Yes Excoriation: No Excoriation: No Excoriation: No Induration: No Induration: No Induration: No Callus: No Callus: No Callus: No Crepitus: No Crepitus: No Crepitus: No Fluctuance: No Fluctuance: No Fluctuance: No Friable: No Friable: No Friable: No Rash: No Rash: No Rash: No Scarring: No Scarring: No Scarring: No Periwound Skin Moist: Yes Moist: Yes Moist: Yes Moisture: Maceration: No Dry/Scaly: Yes Dry/Scaly: Yes Dry/Scaly: No Maceration: No Maceration: No Periwound Skin Color: Atrophie Blanche: No Hemosiderin Staining: Yes Hemosiderin Staining: Yes Cyanosis: No Atrophie Blanche: No Atrophie Blanche: No Ecchymosis: No Cyanosis: No Cyanosis: No Erythema: No Ecchymosis: No Ecchymosis: No Hemosiderin Staining: No Erythema: No Erythema: No Mottled: No Mottled: No Mottled: No Pallor: No Pallor: No Pallor: No Rubor: No Rubor: No Rubor: No Temperature: No Abnormality No Abnormality No Abnormality Tenderness on Yes Yes Yes Palpation: Wound Preparation: Ulcer Cleansing: Ulcer Cleansing: Ulcer Cleansing: Rinsed/Irrigated with Rinsed/Irrigated with Rinsed/Irrigated with Saline Saline Saline Topical Anesthetic Topical Anesthetic Topical Anesthetic Applied: Other: Lidocaine Applied: Other: Lidocaine Applied:  Other: lidocaine 4% Cream 4% Ointment 4% Wound Number: 8 N/A N/A Photos: No Photos N/A N/A Wound Location: Right Toe Second N/A N/A Wounding Event: Not Known N/A N/A Primary Etiology: Arterial Insufficiency Ulcer N/A N/A Secondary Etiology: N/A N/A N/A Comorbid History: N/A N/A N/A Date Acquired: 01/11/2015 N/A N/A Weeks of Treatment: 1 N/A N/A Wound Status: Open N/A N/A [place], Julissa ([id number]) Measurements L x W x D 0x0x0 N/A N/A (cm) Area (cm) : 0 N/A N/A Volume (cm) : 0 N/A N/A % Reduction in Area: 100.00% N/A N/A % Reduction in Volume: 100.00% N/A N/A Classification: Full Thickness Without N/A N/A Exposed Support Structures HBO Classification: N/A N/A N/A Exudate Amount: N/A N/A N/A Exudate Type: N/A N/A N/A Exudate Color: N/A N/A N/A Wound Margin: N/A N/A N/A Granulation Amount: N/A N/A N/A Granulation Quality: N/A N/A N/A Necrotic Amount: N/A N/A N/A Exposed Structures: N/A N/A N/A Epithelialization: N/A N/A N/A Periwound Skin Texture: No Abnormalities Noted N/A N/A Periwound Skin No Abnormalities Noted N/A N/A Moisture: Periwound Skin Color: No Abnormalities Noted N/A N/A Temperature: N/A N/A N/A Tenderness on No N/A N/A Palpation: Wound Preparation: N/A N/A N/A Treatment Notes Electronic [name](s) Signed: 01/19/2015 1:28:45 PM By: Gretta Cool, RN, BSN, Kim RN, BSN Entered By: Gretta Cool, RN, BSN, Kim on 01/19/2015 08:24:55 [name] ([id number]) -------------------------------------------------------------------------------- Multi-Disciplinary  Care Plan Details Patient Name: [name], [name] Date of Service: 01/19/2015 8:15 AM Medical Record Number: [id number] Patient Account Number: [id number] Date of Birth/Sex: 1927-09-15 (81 y.o. Female) Treating RN: Cornell Barman Primary Care Physician: Constance Goltz Other Clinician: Referring Physician: Constance Goltz Treating Physician/Extender: Frann Rider in Treatment: 26 Active Inactive Abuse / Safety / Falls  / Self Care Management Nursing Diagnoses: Impaired physical mobility Potential for falls Goals: Patient will remain injury free Date Initiated: 09/12/2014 Goal Status: Active Patient/caregiver will verbalize understanding of skin care regimen Date Initiated: 09/12/2014 Goal Status: Active Patient/caregiver will verbalize/demonstrate measures taken to prevent injury and/or falls Date Initiated: 09/12/2014 Goal Status: Active Patient/caregiver will verbalize/demonstrate understanding of what to do in case of emergency Date Initiated: 09/12/2014 Goal Status: Active Interventions: Assess fall risk on admission and as needed Assess: immobility, friction, shearing, incontinence upon admission and as needed Assess impairment of mobility on admission and as needed per policy Provide education on basic hygiene Provide education on fall prevention Provide education on personal and home safety Provide education on safe transfers Treatment Activities: Education [name] on Basic Hygiene : 10/12/2014 Notes: Necrotic Tissue SHAMYIA, CUTCHALL ([id number]) Nursing Diagnoses: Impaired tissue integrity related to necrotic/devitalized tissue Knowledge deficit related to management of necrotic/devitalized tissue Goals: Necrotic/devitalized tissue will be minimized in the wound bed Date Initiated: 09/12/2014 Goal Status: Active Patient/caregiver will verbalize understanding of reason and process for debridement of necrotic tissue Date Initiated: 09/12/2014 Goal Status: Active Interventions: Assess patient pain level pre-, during and post procedure and prior to discharge Provide education on necrotic tissue and debridement process Treatment Activities: Apply topical anesthetic as ordered : 01/19/2015 Enzymatic debridement : 01/19/2015 Excisional debridement : 01/19/2015 Notes: Orientation to the Wound Care Program Nursing Diagnoses: Knowledge deficit related to the wound healing center  program Goals: Patient/caregiver will verbalize understanding of the [facility] Date Initiated: 09/12/2014 Goal Status: Active Interventions: Provide education on orientation to the wound center Notes: Pressure Nursing Diagnoses: Knowledge deficit related to causes and risk factors for pressure ulcer development Knowledge deficit related to management of pressures ulcers Potential for impaired tissue integrity related to pressure, friction, moisture, and shear GoalsTREVINA, [name] ([id number]) Patient will remain free from development of additional pressure ulcers Date Initiated: 09/12/2014 Goal Status: Active Patient will remain free of pressure ulcers Date Initiated: 09/12/2014 Goal Status: Active Patient/caregiver will verbalize risk factors for pressure ulcer development Date Initiated: 09/12/2014 Goal Status: Active Patient/caregiver will verbalize understanding of pressure ulcer management Date Initiated: 09/12/2014 Goal Status: Active Interventions: Assess: immobility, friction, shearing, incontinence upon admission and as needed Assess offloading mechanisms upon admission and as needed Assess potential for pressure ulcer upon admission and as needed Provide education on pressure ulcers Treatment Activities: Pressure reduction/relief device ordered : 01/19/2015 Notes: Wound/Skin Impairment Nursing Diagnoses: Impaired tissue integrity Knowledge deficit related to ulceration/compromised skin integrity Goals: Patient/caregiver will verbalize understanding of skin care regimen Date Initiated: 09/12/2014 Goal Status: Active Ulcer/skin breakdown will heal within 14 weeks Date Initiated: 09/12/2014 Goal Status: Active Interventions: Assess patient/caregiver ability to obtain necessary supplies Assess patient/caregiver ability to perform ulcer/skin care regimen upon admission and as needed Assess ulceration(s) every visit Provide education on ulcer and skin  care Treatment Activities: Skin care regimen initiated : 01/19/2015 [name], [name] ([id number]) Topical wound management initiated : 01/19/2015 Notes: Electronic [name](s) Signed: 01/19/2015 1:28:45 PM By: Gretta Cool, RN, BSN, Kim RN, BSN Entered By: Gretta Cool, RN, BSN, Kim on 01/19/2015 08:24:45 [name] ([id number]) -------------------------------------------------------------------------------- Pain Assessment Details Patient Name: [name],  [name] Date of Service: 01/19/2015 8:15 AM Medical Record Number: [id number] Patient Account Number: [id number] Date of Birth/Sex: [birth year] (81 y.o. Female) Treating RN: Baruch Gouty, RN, BSN, Velva Harman Primary Care Physician: Constance Goltz Other Clinician: Referring Physician: Constance Goltz Treating Physician/Extender: Frann Rider in Treatment: 60 Active Problems Location of Pain Severity and Description of Pain Patient Has Paino No Site Locations Pain Management and Medication Current Pain Management: Electronic [name](s) Signed: 01/19/2015 4:29:11 PM By: Regan Lemming BSN, RN Entered By: Regan Lemming on 01/19/2015 08:18:51 [name] ([id number]) -------------------------------------------------------------------------------- Patient/Caregiver Education Details Patient Name: [name] Date of Service: 01/19/2015 8:15 AM Medical Record Number: [id number] Patient Account Number: [id number] Date of Birth/Gender: 05/12/1928 (81 y.o. Female) Treating RN: Baruch Gouty, RN, BSN, Velva Harman Primary Care Physician: Constance Goltz Other Clinician: Referring Physician: Constance Goltz Treating Physician/Extender: Frann Rider in Treatment: 68 Education Assessment Education [name] To: Patient Education Topics [name] Wound/Skin Impairment: Methods: Explain/Verbal Responses: State content correctly [employer]) Signed: 01/19/2015 4:29:11 PM By: Regan Lemming BSN, RN Entered By: Regan Lemming on 01/19/2015 08:58:19 [name]  ([id number]) -------------------------------------------------------------------------------- Wound Assessment Details Patient Name: [name] Date of Service: 01/19/2015 8:15 AM Medical Record Number: [id number] Patient Account Number: [id number] Date of Birth/Sex: 10/28/1927 (81 y.o. Female) Treating RN: Afful, RN, BSN, [place] Primary Care Physician: Constance Goltz Other Clinician: Referring Physician: Constance Goltz Treating Physician/Extender: Frann Rider in Treatment: 18 Wound Status Wound Number: 1 Primary Arterial Insufficiency Ulcer Etiology: Wound Location: Left Calcaneous - Lateral Secondary Pressure Ulcer Wounding Event: Pressure Injury Etiology: Date Acquired: 07/24/2014 Wound Open Weeks Of Treatment: 18 Status: Clustered Wound: No Comorbid Glaucoma, Asthma, Hypertension, History: Peripheral Venous Disease, Type II Diabetes Photos Photo Uploaded By: Regan Lemming on 01/19/2015 16:26:49 Wound Measurements Length: (cm) 1 Width: (cm) 0.8 Depth: (cm) 0.4 Area: (cm) 0.628 Volume: (cm) 0.251 % Reduction in Area: 87.7% % Reduction in Volume: 87.7% Epithelialization: Medium (34-66%) Tunneling: No Wound Description Classification: Partial Thickness Foul O Diabetic Severity (Wagner): Grade 1 Wound Margin: Distinct, outline attached Exudate Amount: Small Exudate Type: Serous Exudate Color: amber dor After Cleansing: No Wound Bed Granulation Amount: Large (67-100%) Exposed Structure [place], Jaleeyah ([id number]) Granulation Quality: Pink Fascia Exposed: No Necrotic Amount: Small (1-33%) Fat Layer Exposed: No Necrotic Quality: Adherent Slough Tendon Exposed: No Muscle Exposed: No Joint Exposed: No Bone Exposed: No Limited to Skin Breakdown Periwound Skin Texture Texture Color No Abnormalities Noted: No No Abnormalities Noted: No Callus: No Atrophie Blanche: No Crepitus: No Cyanosis: No Excoriation: No Ecchymosis: No Fluctuance: No Erythema:  No Friable: No Hemosiderin Staining: No Induration: No Mottled: No Localized Edema: Yes Pallor: No Rash: No Rubor: No Scarring: No Temperature / Pain Moisture Temperature: No Abnormality No Abnormalities Noted: No Tenderness on Palpation: Yes Dry / Scaly: No Maceration: No Moist: Yes Wound Preparation Ulcer Cleansing: Rinsed/Irrigated with Saline Topical Anesthetic Applied: Other: Lidocaine 4% Cream, Treatment Notes Wound #1 (Left, Lateral Calcaneous) 1. Cleansed with: Clean wound with Normal Saline 2. Anesthetic Topical Lidocaine 4% cream to wound bed prior to debridement 4. Dressing Applied: Prisma Ag 5. Secondary Dressing Applied Guaze, ABD and kerlix/Conform 7. Secured with Tape Notes stretch netting Electronic [name](s) [name], [name] ([id number]) Signed: 01/19/2015 4:29:11 PM By: Regan Lemming BSN, RN Entered By: Regan Lemming on 01/19/2015 08:17:39 [name] ([id number]) -------------------------------------------------------------------------------- Wound Assessment Details Patient Name: [name] Date of Service: 01/19/2015 8:15 AM Medical Record Number: [id number] Patient Account Number: [id number] Date of Birth/Sex: [birth year] (81 y.o. Female) Treating RN: Afful, RN, BSN, Velva Harman Primary Care Physician: Renard Hamper,  HANNAH Other Clinician: Referring Physician: Constance Goltz Treating Physician/Extender: Frann Rider in Treatment: 18 Wound Status Wound Number: 5 Primary Arterial Insufficiency Ulcer Etiology: Wound Location: Right Calcaneous Wound Open Wounding Event: Gradually Appeared Status: Date Acquired: 09/26/2014 Comorbid Glaucoma, Asthma, Hypertension, Weeks Of Treatment: 15 History: Peripheral Venous Disease, Type II Clustered Wound: No Diabetes Photos Photo Uploaded By: Regan Lemming on 01/19/2015 16:26:50 Wound Measurements Length: (cm) 1.5 Width: (cm) 1 Depth: (cm) 0.3 Area: (cm) 1.178 Volume: (cm) 0.353 % Reduction in Area:  78.6% % Reduction in Volume: 35.8% Epithelialization: Medium (34-66%) Tunneling: No Undermining: No Wound Description Full Thickness Without Exposed Foul Odor A Classification: Support Structures Diabetic Severity Grade 1 (Wagner): Wound Margin: Distinct, outline attached Exudate Amount: Small Exudate Type: Serosanguineous Exudate Color: red, brown fter Cleansing: No Wound Bed Granulation Amount: Large (67-100%) Exposed Structure [place], Makalynn ([id number]) Granulation Quality: Pink Fascia Exposed: No Necrotic Amount: Small (1-33%) Fat Layer Exposed: No Necrotic Quality: Adherent Slough Tendon Exposed: No Muscle Exposed: No Joint Exposed: No Bone Exposed: No Limited to Skin Breakdown Periwound Skin Texture Texture Color No Abnormalities Noted: No No Abnormalities Noted: No Callus: No Atrophie Blanche: No Crepitus: No Cyanosis: No Excoriation: No Ecchymosis: No Fluctuance: No Erythema: No Friable: No Hemosiderin Staining: Yes Induration: No Mottled: No Localized Edema: Yes Pallor: No Rash: No Rubor: No Scarring: No Temperature / Pain Moisture Temperature: No Abnormality No Abnormalities Noted: No Tenderness on Palpation: Yes Dry / Scaly: Yes Maceration: No Moist: Yes Wound Preparation Ulcer Cleansing: Rinsed/Irrigated with Saline Topical Anesthetic Applied: Other: Lidocaine 4% Ointment, Treatment Notes Wound #5 (Right Calcaneous) 1. Cleansed with: Clean wound with Normal Saline 2. Anesthetic Topical Lidocaine 4% cream to wound bed prior to debridement 4. Dressing Applied: Prisma Ag 5. Secondary Dressing Applied Guaze, ABD and kerlix/Conform 7. Secured with Tape Notes stretch netting Electronic [name](s) [place], Maxyne ([id number]) Signed: 01/19/2015 4:29:11 PM By: Regan Lemming BSN, RN Entered By: Regan Lemming on 01/19/2015 08:17:55 [name]  ([id number]) -------------------------------------------------------------------------------- Wound Assessment Details Patient Name: [name] Date of Service: 01/19/2015 8:15 AM Medical Record Number: [id number] Patient Account Number: [id number] Date of Birth/Sex: [birth year] (81 y.o. Female) Treating RN: Afful, RN, BSN, Velva Harman Primary Care Physician: Constance Goltz Other Clinician: Referring Physician: Constance Goltz Treating Physician/Extender: Frann Rider in Treatment: 18 Wound Status Wound Number: 6 Primary Arterial Insufficiency Ulcer Etiology: Wound Location: Left Achilles Wound Open Wounding Event: Gradually Appeared Status: Date Acquired: 09/26/2014 Comorbid Glaucoma, Asthma, Hypertension, Weeks Of Treatment: 15 History: Peripheral Venous Disease, Type II Clustered Wound: No Diabetes Photos Photo Uploaded By: Regan Lemming on 01/19/2015 16:26:50 Wound Measurements Length: (cm) 1.2 Width: (cm) 1.2 Depth: (cm) 0.1 Area: (cm) 1.131 Volume: (cm) 0.113 % Reduction in Area: 70% % Reduction in Volume: 94% Epithelialization: Medium (34-66%) Wound Description Classification: Partial Thickness Foul O Diabetic Severity (Wagner): Grade 1 Wound Margin: Distinct, outline attached Exudate Amount: Small Exudate Type: Serous Exudate Color: amber dor After Cleansing: No Wound Bed Granulation Amount: Large (67-100%) Exposed Structure Granulation Quality: Pink Fascia Exposed: No Necrotic Amount: None Present (0%) Fat Layer Exposed: No [place], Amaris ([id number]) Tendon Exposed: No Muscle Exposed: No Joint Exposed: No Bone Exposed: No Limited to Skin Breakdown Periwound Skin Texture Texture Color No Abnormalities Noted: No No Abnormalities Noted: No Callus: No Atrophie Blanche: No Crepitus: No Cyanosis: No Excoriation: No Ecchymosis: No Fluctuance: No Erythema: No Friable: No Hemosiderin Staining: Yes Induration: No Mottled: No Localized Edema:  Yes Pallor: No Rash: No Rubor: No Scarring: No Temperature / Pain  Moisture Temperature: No Abnormality No Abnormalities Noted: No Tenderness on Palpation: Yes Dry / Scaly: Yes Maceration: No Moist: Yes Wound Preparation Ulcer Cleansing: Rinsed/Irrigated with Saline Topical Anesthetic Applied: Other: lidocaine 4%, Treatment Notes Wound #6 (Left Achilles) 1. Cleansed with: Clean wound with Normal Saline 2. Anesthetic Topical Lidocaine 4% cream to wound bed prior to debridement 4. Dressing Applied: Prisma Ag 5. Secondary Dressing Applied Guaze, ABD and kerlix/Conform 7. Secured with Tape Notes stretch netting Electronic [name](s) Signed: 01/19/2015 4:29:11 PM By: Regan Lemming BSN, RN Entered By: Regan Lemming on 01/19/2015 08:18:22 DYNISTY, SAPIA ([id number]) [place], Graceanna ([id number]) -------------------------------------------------------------------------------- Wound Assessment Details Patient Name: [name] Date of Service: 01/19/2015 8:15 AM Medical Record Number: [id number] Patient Account Number: [id number] Date of Birth/Sex: 07-Mar-1928 (81 y.o. Female) Treating RN: Afful, RN, BSN, Velva Harman Primary Care Physician: Constance Goltz Other Clinician: Referring Physician: Constance Goltz Treating Physician/Extender: Frann Rider in Treatment: 18 Wound Status Wound Number: 8 Primary Etiology: Arterial Insufficiency Ulcer Wound Location: Right Toe Second Wound Status: Open Wounding Event: Not Known Date Acquired: 01/11/2015 Weeks Of Treatment: 1 Clustered Wound: No Photos Photo Uploaded By: Regan Lemming on 01/19/2015 16:27:44 Wound Measurements Length: (cm) 0 % Reducti Width: (cm) 0 % Reducti Depth: (cm) 0 Area: (cm) 0 Volume: (cm) 0 on in Area: 100% on in Volume: 100% Wound Description Full Thickness Without Exposed Classification: Support Structures Periwound Skin Texture Texture Color No Abnormalities Noted: No No Abnormalities Noted:  No Moisture No Abnormalities Noted: No Electronic [name](s) Signed: 01/19/2015 4:29:11 PM By: Regan Lemming BSN, RN Bogart, Marili ([id number]) Entered By: Regan Lemming on 01/19/2015 08:16:16 [name] ([id number]) -------------------------------------------------------------------------------- Vitals Details Patient Name: [name] Date of Service: 01/19/2015 8:15 AM Medical Record Number: [id number] Patient Account Number: [id number] Date of Birth/Sex: [birth year] (81 y.o. Female) Treating RN: Afful, RN, BSN, Velva Harman Primary Care Physician: Constance Goltz Other Clinician: Referring Physician: Constance Goltz Treating Physician/Extender: Frann Rider in Treatment: 18 Vital Signs Time Taken: 08:12 Temperature (F): 99.5 Height (in): 64 Pulse (bpm): 75 Weight (lbs): 180 Respiratory Rate (breaths/min): 18 Body Mass Index (BMI): 30.9 Blood Pressure (mmHg): 130/55 Reference Range: 80 - 120 mg / dl Electronic [name](s) Signed: 01/19/2015 4:29:11 PM By: Regan Lemming BSN, RN Entered By: Regan Lemming on 01/19/2015 08:12:31

## 2015-01-25 ENCOUNTER — Encounter: Payer: Medicare Other | Admitting: Surgery

## 2015-01-25 DIAGNOSIS — L97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: Secondary | ICD-10-CM | POA: Diagnosis not present

## 2015-01-26 NOTE — Progress Notes (Signed)
RAEGENE, GOLDMANN ([id number]) Visit Report for 01/25/2015 Chief Complaint Document Details Patient Name: [name], [name] Date of Service: 01/25/2015 8:15 AM Medical Record Number: [id number] Patient Account Number: [id number] Date of Birth/Sex: 21-Aug-1927 (81 y.o. 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 She also has some superficial ulcerations on the second and third toe dorsum. she has no family member with her today and she is a very poor historian and cannot give a proper history. As stated that she walks around with a walker. Electronic Signature(s) Signed: 01/25/2015 4:41:50 PM By: Christin Fudge MD, FACS Entered By: Christin Fudge on 01/25/2015 08:52:41 PAMALLA, NAVAL ([id number]) -------------------------------------------------------------------------------- Debridement Details Patient Name: [name] Date of Service: 01/25/2015 8:15 AM Medical Record Number: [id number] Patient Account Number: [id number] Date of Birth/Sex: 10/09/1927 (81 y.o. Female) Treating RN: Primary Care Physician: Constance Goltz Other Clinician: Referring Physician: Constance Goltz Treating Physician/Extender: Frann Rider in Treatment: 19 Debridement Performed for Wound #5 Right Calcaneous Assessment: Performed By: Physician Pat Patrick., MD Debridement: Open Wound/Selective Debridement Selective Description: Pre-procedure Yes Verification/Time Out Taken: Start Time: 08:46 Pain Control: Lidocaine 4% Topical Solution Level: Non-Viable Tissue Total Area Debrided (L x 0.8 (cm) x 1 (cm) = 0.8 (cm) W): Tissue and other Non-Viable, Eschar, Exudate, Fibrin/Slough material  debrided: Instrument: Curette Bleeding: Minimum Hemostasis Achieved: Pressure End Time: 08:48 Procedural Pain: 0 Post Procedural Pain: 0 Response to Treatment: Procedure was tolerated well Post Debridement Measurements of Total Wound Length: (cm) 0.8 Width: (cm) 1 Depth: (cm) 0.2 Volume: (cm) 0.126 Electronic Signature(s) Signed: 01/25/2015 4:41:50 PM By: Christin Fudge MD, FACS Entered By: Christin Fudge on 01/25/2015 08:52:32 [name] ([id number]) -------------------------------------------------------------------------------- HPI Details Patient Name: [name] Date of Service: 01/25/2015 8:15 AM Medical Record Number: [id number] Patient Account Number: [id number] Date of Birth/Sex: 1927/12/23 (81 y.o. Female) Treating RN: Primary Care Physician: Constance Goltz Other Clinician: Referring Physician: Constance Goltz Treating Physician/Extender: Frann Rider in Treatment: 19 History of Present Illness Location: left heel Quality: Patient reports experiencing a dull pain to affected area(s). Severity: Patient states wound are getting worse. Duration: Patient states that they are not certain how long the wound has been present. Timing: Pain in wound is Intermittent (comes and goes Context: The wound appeared gradually over time Associated Signs and Symptoms: Patient reports having difficulty standing for long periods. HPI Description: This 81 year old patient is a poor historian and comes from a nursing home. Does not have any family members with her. Says she has a ulcer on the lateral part of her left foot and some new ones are there on her right foot too. She is unable to say how long she's had these. she does walk with a walker and this is limited most of the day. Does not recall if she has had any vascular studies done. I have reviewed an x-ray done of the left foot which basically shows osteoporosis but no evidence of fracture dislocation or  osteomyelitis. 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 She seems to be doing better overall. 10/19/14 -- I understand she has gone to the vascular surgery and  lab yesterday and has had all her tests done yesterday. She is doing fine otherwise and has had no fresh issues. 10/26/2014 she seems to be doing well and has no fresh problems and seems much brighter. 10/12/14 -- Taking her antibiotic and continues to do her dressings locally and she is helped by her skilled nursing. She seems to be doing fine and has no fresh issues. 10/19/14 -- she is doing fine and has had no fresh issues. She says her son to go for her vascular workup yesterday and she has been called back after 3 months. we will try and gather these reports to review what they said. 10/26/14 --I have been able to review notes from the vascular surgery office and these were dated from 10/18/2014. He patient was on her first postop visit and had Doppler ultrasounds done of her arteries. She had more than 50% stenosis of the right superficial femoral artery and more than 50% stenosis in the left tibioperoneal trunk. She was status post a left lower extremity angiogram with angioplasty of the peroneal and left superficial femoral arteries for a nonhealing left foot and ankle ulceration. note is made of the fact that the ABIs had not changed in the last month since her previous visit. The opinion of her provider was that she did not need any intervention at this time and they had done everything they could at the present time. They would continue on Plavix and antibiotics and see her back in 3 months for a another arterial duplex study of the lower extremities. DEMEKA, FRAZER ([id number]) 11/02/2014 -- she has spoken to her caregivers about coming for HBO 5 times per week  for 6-8 weeks and they are agreeable about this and she is willing to undergo hyperbaric oxygen therapy. 11/16/2014 she has been worked up with a EKG and chest x-ray and these were within normal limits. As far as the investigations go she is ready for HBOT. we are awaiting some insurance clearance so that she can start on her hyperbaric oxygen therapy. 11/30/2014 she tolerated hyperbaric oxygen therapy fine and there were no issues with her blood glucose levels. As noted she is not a diabetic and most of her fingerstick blood glucose levels are inaccurate because of the Raynaud's phenomena. She normally has to have your lobe sticks to get any random glucose levels. I have also requested her primary care does a venous blood glucose draw to check her glucose level. 12/21/2014 -- she is tolerating hyperbaric oxygen very well and has overall made a lot of progress. Her left leg is looking much better. 01/11/2015 - No new complaints today. New ulceration on the dorsum of the right second toe noted today on physical exam. Patient unaware. No significant pain. No fever or chills. No significant drainage. Offloading with sage boots at night. Significantly improved with HBO. 01/19/2015 - the patient has had a x-ray of the right foot done at the nursing home but we still do not have the report and we are awaiting this result. She is otherwise doing fine with her general health. 01/25/2015 -- X-ray of her right foot showed no acute fracture, dislocation, bony infection or osteomyelitis. The was some soft tissue swelling and osteopenia seen. Electronic Signature(s) Signed: 01/25/2015 4:41:50 PM By: Christin Fudge MD, FACS Entered By: Christin Fudge on 01/25/2015 08:52:52 CYANA, NAGARAJAN ([id number]) -------------------------------------------------------------------------------- Physical Exam Details Patient Name: [name] Date of Service: 01/25/2015 8:15 AM Medical Record Number: [id number] Patient  Account Number: [id number] Date of Birth/Sex:  08/02/1928 (81 y.o. Female) Treating RN: Primary Care Physician: Constance Goltz Other Clinician: Referring Physician: Constance Goltz Treating Physician/Extender: Frann Rider in Treatment: 19 Constitutional . Pulse regular. Respirations normal and unlabored. Afebrile. . Eyes Nonicteric. Reactive to light. Ears, Nose, Mouth, and Throat Lips, teeth, and gums WNL.Marland Kitchen Moist mucosa without lesions . Neck supple and nontender. No palpable supraclavicular or cervical adenopathy. Normal sized without goiter. Respiratory WNL. No retractions.. Cardiovascular Pedal Pulses WNL. No clubbing, cyanosis or edema. Integumentary (Hair, Skin) all the wounds look very clean and the only base where debridement was necessary was the right calcaneum.. No crepitus or fluctuance. No peri-wound warmth or erythema. No masses.Marland Kitchen Psychiatric Judgement and insight Intact.. No evidence of depression, anxiety, or agitation.. Electronic Signature(s) Signed: 01/25/2015 4:41:50 PM By: Christin Fudge MD, FACS Entered By: Christin Fudge on 01/25/2015 08:53:38 LATOIA, MANGOLD ([id number]) -------------------------------------------------------------------------------- Physician Orders Details Patient Name: [name] Date of Service: 01/25/2015 8:15 AM Medical Record Number: [id number] Patient Account Number: [id number] Date of Birth/Sex: September 20, 1927 (81 y.o. Female) Treating RN: Montey Hora Primary Care Physician: Constance Goltz Other Clinician: Referring Physician: Constance Goltz Treating Physician/Extender: Frann Rider in Treatment: 21 Verbal / Phone Orders: Yes Clinician: Montey Hora Read Back and Verified: Yes Diagnosis Coding Wound Cleansing Wound #1 Left,Lateral Calcaneous o Clean wound with Normal Saline. - be sure to clean out all remaining prisma Wound #5 Right Calcaneous o Clean wound with Normal Saline. - be sure to clean out all remaining  prisma Wound #6 Left Achilles o Clean wound with Normal Saline. - be sure to clean out all remaining prisma Anesthetic Wound #1 Left,Lateral Calcaneous o Topical Lidocaine 4% cream applied to wound bed prior to debridement o Hurricaine Topical Anesthetic Spray applied to wound bed prior to debridement Wound #5 Right Calcaneous o Topical Lidocaine 4% cream applied to wound bed prior to debridement o Hurricaine Topical Anesthetic Spray applied to wound bed prior to debridement Wound #6 Left Achilles o Topical Lidocaine 4% cream applied to wound bed prior to debridement o Hurricaine Topical Anesthetic Spray applied to wound bed prior to debridement Primary Wound Dressing Wound #1 Left,Lateral Calcaneous o Prisma Ag Wound #5 Right Calcaneous o Prisma Ag Wound #6 Left Achilles o Prisma Ag Secondary Dressing Wound #1 Left,Lateral Calcaneous o ABD and Kerlix/Conform [place], Makeila ([id number]) Wound #5 Right Calcaneous o ABD and Kerlix/Conform Wound #6 Left Achilles o ABD and Kerlix/Conform Dressing Change Frequency Wound #1 Left,Lateral Calcaneous o Change dressing every other day. Wound #5 Right Calcaneous o Change dressing every other day. Wound #6 Left Achilles o Change dressing every other day. Follow-up Appointments Wound #1 Left,Lateral Calcaneous o Return Appointment in 1 week. Wound #5 Right Calcaneous o Return Appointment in 1 week. Wound #6 Left Achilles o Return Appointment in 1 week. Off-Loading Wound #1 Left,Lateral Calcaneous o Other: - SAGE boots while sitting or lying. Do not wear while ambulating. Wound #5 Right Calcaneous o Other: - SAGE boots while sitting or lying. Do not wear while ambulating. Wound #6 Left Achilles o Other: - SAGE boots while sitting or lying. Do not wear while ambulating. Home Health Wound #1 [place] Visits - [place] Nurse may visit  PRN to address patientos wound care needs. o FACE TO FACE ENCOUNTER: MEDICARE and MEDICAID PATIENTS: I certify that this patient is under my care and that I had a face-to-face encounter that meets the physician face-to-face encounter requirements with this patient on this date. The encounter with  the patient was in whole or in part for the following MEDICAL CONDITION: (primary reason for [place]) MEDICAL NECESSITY: I certify, that based on my findings, NURSING services are a medically necessary home health service. HOME BOUND STATUS: I certify that my clinical findings support that this patient is homebound (i.e., Due to illness or injury, pt requires aid of JIMMYE, MCGILBERRY ([id number]) supportive devices such as crutches, cane, wheelchairs, walkers, the use of special transportation or the assistance of another person to leave their place of residence. There is a normal inability to leave the home and doing so requires considerable and taxing effort. Other absences are for medical reasons / religious services and are infrequent or of short duration when for other reasons). o If current dressing causes regression in wound condition, may D/C ordered dressing product/s and apply Normal Saline Moist Dressing daily until next [place] / Other MD appointment. [place] of regression in wound condition at [phone]. o Please direct any NON-WOUND related issues/requests for orders to patient's Primary Care Physician Wound #5 Right [place] Visits - [place] Nurse may visit PRN to address patientos wound care needs. o FACE TO FACE ENCOUNTER: MEDICARE and MEDICAID PATIENTS: I certify that this patient is under my care and that I had a face-to-face encounter that meets the physician face-to-face encounter requirements with this patient on this date. The encounter with the patient was in whole or in part for the  following MEDICAL CONDITION: (primary reason for [place]) MEDICAL NECESSITY: I certify, that based on my findings, NURSING services are a medically necessary home health service. HOME BOUND STATUS: I certify that my clinical findings support that this patient is homebound (i.e., Due to illness or injury, pt requires aid of supportive devices such as crutches, cane, wheelchairs, walkers, the use of special transportation or the assistance of another person to leave their place of residence. There is a normal inability to leave the home and doing so requires considerable and taxing effort. Other absences are for medical reasons / religious services and are infrequent or of short duration when for other reasons). o If current dressing causes regression in wound condition, may D/C ordered dressing product/s and apply Normal Saline Moist Dressing daily until next [place] / Other MD appointment. [place] of regression in wound condition at [phone]. o Please direct any NON-WOUND related issues/requests for orders to patient's Primary Care Physician Wound #6 Left Achilles o [place] Visits - [place] Nurse may visit PRN to address patientos wound care needs. o FACE TO FACE ENCOUNTER: MEDICARE and MEDICAID PATIENTS: I certify that this patient is under my care and that I had a face-to-face encounter that meets the physician face-to-face encounter requirements with this patient on this date. The encounter with the patient was in whole or in part for the following MEDICAL CONDITION: (primary reason for [place]) MEDICAL NECESSITY: I certify, that based on my findings, NURSING services are a medically necessary home health service. HOME BOUND STATUS: I certify that my clinical findings support that this patient is homebound (i.e., Due to illness or injury, pt requires aid of supportive devices such as crutches, cane,  wheelchairs, walkers, the use of special transportation or the assistance of another person to leave their place of residence. There is a normal inability to leave the home and doing so requires considerable and taxing effort. Other absences are for medical reasons /  religious services and are infrequent or of short duration when for other reasons). o If current dressing causes regression in wound condition, may D/C ordered dressing product/s and apply Normal Saline Moist Dressing daily until next [place] / Other MD appointment. [place] of regression in wound condition at [phone]. YANIELIS, JUNEK ([id number]) o Please direct any NON-WOUND related issues/requests for orders to patient's Primary Care Physician Electronic Signature(s) Signed: 01/25/2015 4:41:50 PM By: Christin Fudge MD, FACS Signed: 01/25/2015 5:03:06 PM By: Montey Hora Entered By: Montey Hora on 01/25/2015 08:51:28 [name] ([id number]) -------------------------------------------------------------------------------- Problem List Details Patient Name: [name] Date of Service: 01/25/2015 8:15 AM Medical Record Number: [id number] Patient Account Number: [id number] Date of Birth/Sex: 04/15/1928 (81 y.o. 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/26/2014 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 Female) Treating RN: Primary Care Physician: Constance Goltz Other Clinician: Referring Physician: Constance Goltz Treating Physician/Extender: Frann Rider in Treatment: 55 Subjective Chief Complaint Information obtained from Patient Patient presents to the wound care center today with an open arterial ulcer on the left foot big toe and lateral heal. She also has some superficial ulcerations on the second and third toe dorsum. she has no family member with her today and she is a very poor historian and cannot give a proper history. As stated that she walks around with a walker. History of Present Illness (HPI) The following HPI elements were documented for the patient's wound: Location: left heel Quality: Patient reports experiencing a dull pain to affected area(s). Severity: Patient states wound are getting worse. Duration: Patient states that they are not certain how long the wound has been present. Timing: Pain in wound is Intermittent (comes and goes Context: The wound appeared gradually over time Associated Signs and Symptoms: Patient reports having difficulty standing for long periods. This 81 year old patient is a poor historian and comes from a nursing home. Does not have any family members with her. Says she has a ulcer on the lateral part of her left foot and some new ones are there on her right foot too. She  is unable to say how long she's had these. she does walk with a walker and this is limited most of the day. Does not recall if she has had any vascular studies done. I have reviewed an x-ray done of the left foot which basically shows osteoporosis but no evidence of fracture dislocation or osteomyelitis. 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 She seems to be doing better overall. 10/19/14 -- I understand she has gone to the vascular surgery and lab yesterday and has had all her tests done yesterday. She is doing fine otherwise and has had no fresh issues. 10/26/2014 she seems to be doing well and has no fresh problems and seems much brighter. 10/12/14 -- Taking her antibiotic and continues to do her dressings locally and she is helped by her skilled nursing. She seems to be doing fine and has no fresh issues. 10/19/14 -- she is doing fine and has had no fresh issues. She says her son to go for her vascular workup yesterday and she has been called back after 3 months. we will try and gather these reports to review what KATELYNE, FETTERMAN ([id number]) they said. 10/26/14 --I have been able to review notes from the vascular surgery office and these were dated from 10/18/2014. He patient was on her first postop visit and had Doppler ultrasounds done of her arteries. She had more than 50% stenosis of the right superficial femoral artery and more than 50% stenosis in the left tibioperoneal trunk. She was status post a left lower extremity angiogram with angioplasty of the peroneal and left superficial femoral arteries for a nonhealing left foot and ankle ulceration. note is made of the fact that the ABIs had not changed in the last month since her previous visit. The opinion of her provider was that she did not need any intervention at  this time and they had done everything they could at the present time. They would continue on Plavix and antibiotics and see her back in 3 months for a another arterial duplex study of the lower extremities. 11/02/2014 -- she has spoken to her caregivers about coming for HBO 5 times per week for 6-8 weeks and they are agreeable about this and she is willing to undergo hyperbaric oxygen therapy. 11/16/2014 she has been worked up with a EKG and chest x-ray and these were within normal limits. As far as the investigations go she is ready for HBOT. we are awaiting some insurance clearance so that she can start on her hyperbaric oxygen therapy. 11/30/2014 she tolerated hyperbaric oxygen therapy fine and there were no issues with her blood glucose levels. As noted she is not a diabetic and most of her fingerstick blood glucose levels are inaccurate because of the Raynaud's phenomena. She normally has to have your lobe sticks to get any random glucose levels. I have also requested her primary care does a venous blood glucose draw to check her glucose level. 12/21/2014 -- she is tolerating hyperbaric oxygen very well and has overall made a lot of progress. Her left leg is looking much better. 01/11/2015 - No new complaints today. New ulceration on the dorsum of the right second toe noted today on physical exam. Patient unaware. No significant pain. No fever or chills. No significant drainage. Offloading with sage boots at night. Significantly improved with HBO. 01/19/2015 - the patient has had a x-ray of the right foot done at the nursing home but we still do not have the report and we are awaiting this result. She is otherwise doing fine with her general health. 01/25/2015 -- X-ray of her right foot showed no acute fracture, dislocation, bony infection or osteomyelitis. The was some soft tissue swelling and osteopenia seen. Objective Constitutional Pulse regular. Respirations normal and unlabored.  Afebrile. [place]el[place], Laurella ([id number]) Vitals Time Taken: 8:12 AM, Height: 64 in, Weight: 180 lbs, BMI:  30.9, Temperature: 98.5 F, Pulse: 70 bpm, Respiratory Rate: 17 breaths/min, Blood Pressure: 142/54 mmHg. Eyes Nonicteric. Reactive to light. Ears, Nose, Mouth, and Throat Lips, teeth, and gums WNL.Marland Kitchen Moist mucosa without lesions . Neck supple and nontender. No palpable supraclavicular or cervical adenopathy. Normal sized without goiter. Respiratory WNL. No retractions.. Cardiovascular Pedal Pulses WNL. No clubbing, cyanosis or edema. Psychiatric Judgement and insight Intact.. No evidence of depression, anxiety, or agitation.. Integumentary (Hair, Skin) all the wounds look very clean and the only base where debridement was necessary was the right calcaneum.. No crepitus or fluctuance. No peri-wound warmth or erythema. No masses.. Wound #1 status is Open. Original cause of wound was Pressure Injury. The wound is located on the Left,Lateral Calcaneous. The wound measures 0.7cm length x 0.8cm width x 0.3cm depth; 0.44cm^2 area and 0.132cm^3 volume. The wound is limited to skin breakdown. There is no tunneling or undermining noted. There is a small amount of serous drainage noted. The wound margin is distinct with the outline attached to the wound base. There is large (67-100%) pink granulation within the wound bed. There is a small (1-33%) amount of necrotic tissue within the wound bed including Adherent Slough. The periwound skin appearance exhibited: Localized Edema, Moist. The periwound skin appearance did not exhibit: Callus, Crepitus, Excoriation, Fluctuance, Friable, Induration, Rash, Scarring, Dry/Scaly, Maceration, Atrophie Blanche, Cyanosis, Ecchymosis, Hemosiderin Staining, Mottled, Pallor, Rubor, Erythema. Periwound temperature was noted as No Abnormality. The periwound has tenderness on palpation. Wound #5 status is Open. Original cause of wound was Gradually Appeared. The  wound is located on the Right Calcaneous. The wound measures 0.8cm length x 1cm width x 0.2cm depth; 0.628cm^2 area and 0.126cm^3 volume. The wound is limited to skin breakdown. There is no tunneling or undermining noted. There is a small amount of serosanguineous drainage noted. The wound margin is distinct with the outline attached to the wound base. There is large (67-100%) pink granulation within the wound bed. There is a small (1-33%) amount of necrotic tissue within the wound bed including Adherent Slough. The periwound skin appearance exhibited: Localized Edema, Dry/Scaly, Moist, Hemosiderin Staining. The periwound skin appearance did not exhibit: Callus, Crepitus, Excoriation, Fluctuance, Friable, Induration, Rash, Scarring, Maceration, Atrophie Blanche, Cyanosis, Ecchymosis, Mottled, Pallor, Rubor, Erythema. Periwound temperature was noted as No Abnormality. The periwound has tenderness on palpation. [place], [place] ([id number]) Wound #6 status is Open. Original cause of wound was Gradually Appeared. The wound is located on the Left Achilles. The wound measures 1.5cm length x 1.4cm width x 0.1cm depth; 1.649cm^2 area and 0.165cm^3 volume. The wound is limited to skin breakdown. There is no tunneling or undermining noted. There is a small amount of serous drainage noted. The wound margin is distinct with the outline attached to the wound base. There is large (67-100%) pink granulation within the wound bed. There is no necrotic tissue within the wound bed. The periwound skin appearance exhibited: Localized Edema, Dry/Scaly, Hemosiderin Staining. The periwound skin appearance did not exhibit: Callus, Crepitus, Excoriation, Fluctuance, Friable, Induration, Rash, Scarring, Maceration, Moist, Atrophie Blanche, Cyanosis, Ecchymosis, Mottled, Pallor, Rubor, Erythema. Periwound temperature was noted as No Abnormality. The periwound has tenderness on palpation. 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 There was a Non-Viable Tissue Open Wound/Selective [phone] debridement with total area of 0.8 sq cm performed by Pat Patrick., MD. with the following instrument(s): Curette to remove Non-Viable tissue/material including Exudate, Fibrin/Slough, and Eschar after achieving pain control using Lidocaine 4% Topical Solution. A time out was conducted prior to the start of the procedure. A Minimum amount of bleeding was controlled with Pressure. The procedure was tolerated well with a pain level of 0 throughout and a pain level of 0 following the procedure. Post Debridement Measurements: 0.8cm length x 1cm width x 0.2cm depth; 0.126cm^3 volume. Plan Wound Cleansing: AVONELLE, RUCKEL ([id number]) Wound #1 Left,Lateral Calcaneous: Clean wound with Normal Saline. - be sure to clean out all remaining prisma Wound #5 Right Calcaneous: Clean wound with Normal Saline. - be sure to clean out all remaining prisma Wound #6 Left Achilles: Clean wound with Normal Saline. - be sure to clean out all remaining prisma Anesthetic: Wound #1 Left,Lateral Calcaneous: Topical Lidocaine 4% cream applied to wound bed prior to debridement Hurricaine Topical Anesthetic Spray applied to wound bed prior to debridement Wound #5 Right Calcaneous: Topical Lidocaine 4% cream applied to wound bed prior to debridement Hurricaine Topical Anesthetic Spray  applied to wound bed prior to debridement Wound #6 Left Achilles: Topical Lidocaine 4% cream applied to wound bed prior to debridement Hurricaine Topical Anesthetic Spray applied to wound bed prior to debridement Primary Wound Dressing: Wound #1 Left,Lateral Calcaneous: Prisma Ag Wound #5 Right Calcaneous: Prisma Ag Wound #6 Left Achilles: Prisma Ag Secondary Dressing: Wound #1 Left,Lateral Calcaneous: ABD and Kerlix/Conform Wound #5 Right Calcaneous: ABD and Kerlix/Conform Wound #6 Left Achilles: ABD and Kerlix/Conform Dressing Change Frequency: Wound #1 Left,Lateral Calcaneous: Change dressing every other day. Wound #5 Right Calcaneous: Change dressing every other day. Wound #6 Left Achilles: Change dressing every other day. Follow-up Appointments: Wound #1 Left,Lateral Calcaneous: Return Appointment in 1 week. Wound #5 Right Calcaneous: Return Appointment in 1 week. Wound #6 Left Achilles: Return Appointment in 1 week. Off-Loading: Wound #1 Left,Lateral Calcaneous: Other: - SAGE boots while sitting or lying. Do not wear while ambulating. Wound #5 Right Calcaneous: Other: - SAGE boots while sitting or lying. Do not wear while ambulating. Wound #6 Left Achilles: Other: - SAGE boots while sitting or lying. Do not wear while ambulating. [place], [place] ([id number]) Home Health: Wound #1 Left,Lateral Calcaneous: Continue Home Health Visits - [facility] Nurse may visit PRN to address patient s wound care needs. FACE TO FACE ENCOUNTER: MEDICARE and MEDICAID PATIENTS: I certify that this patient is under my care and that I had a face-to-face encounter that meets the physician face-to-face encounter requirements with this patient on this date. The encounter with the patient was in whole or in part for the following MEDICAL CONDITION: (primary reason for [place]) MEDICAL NECESSITY: I certify, that based on my findings, NURSING services are a medically  necessary home health service. HOME BOUND STATUS: I certify that my clinical findings support that this patient is homebound (i.e., Due to illness or injury, pt requires aid of supportive devices such as crutches, cane, wheelchairs, walkers, the use of special transportation or the assistance of another person to leave their place of residence. There is a normal inability to leave the home and doing so requires considerable  and taxing effort. Other absences are for medical reasons / religious services and are infrequent or of short duration when for other reasons). If current dressing causes regression in wound condition, may D/C ordered dressing product/s and apply Normal Saline Moist Dressing daily until next [place] / Other MD appointment. [place] of regression in wound condition at [phone]. Please direct any NON-WOUND related issues/requests for orders to patient's Primary Care Physician Wound #5 Right Calcaneous: [place] Visits - [facility] Nurse may visit PRN to address patient s wound care needs. FACE TO FACE ENCOUNTER: MEDICARE and MEDICAID PATIENTS: I certify that this patient is under my care and that I had a face-to-face encounter that meets the physician face-to-face encounter requirements with this patient on this date. The encounter with the patient was in whole or in part for the following MEDICAL CONDITION: (primary reason for [place]) MEDICAL NECESSITY: I certify, that based on my findings, NURSING services are a medically necessary home health service. HOME BOUND STATUS: I certify that my clinical findings support that this patient is homebound (i.e., Due to illness or injury, pt requires aid of supportive devices such as crutches, cane, wheelchairs, walkers, the use of special transportation or the assistance of another person to leave their place of residence. There is a normal inability to leave the home and  doing so requires considerable and taxing effort. Other absences are for medical reasons / religious services and are infrequent or of short duration when for other reasons). If current dressing causes regression in wound condition, may D/C ordered dressing product/s and apply Normal Saline Moist Dressing daily until next [place] / Other MD appointment. [place] of regression in wound condition at [phone]. Please direct any NON-WOUND related issues/requests for orders to patient's Primary Care Physician Wound #6 Left Achilles: [place] Visits - [facility] Nurse may visit PRN to address patient s wound care needs. FACE TO FACE ENCOUNTER: MEDICARE and MEDICAID PATIENTS: I certify that this patient is under my care and that I had a face-to-face encounter that meets the physician face-to-face encounter requirements with this patient on this date. The encounter with the patient was in whole or in part for the following MEDICAL CONDITION: (primary reason for [place]) MEDICAL NECESSITY: I certify, that based on my findings, NURSING services are a medically necessary home health service. HOME BOUND STATUS: I certify that my clinical findings support that this patient is homebound (i.e., Due to illness or injury, pt requires aid of supportive devices such as crutches, cane, wheelchairs, walkers, the use of special transportation or the assistance of another person to leave their place of residence. There is a normal inability to leave the home and doing so requires considerable and taxing effort. Other absences are for medical reasons / religious services and are infrequent or of short duration when for other reasons). If current dressing causes regression in wound condition, may D/C ordered dressing product/s and apply Normal Saline Moist Dressing daily until next [place] / Other MD appointment. [place] of  regression in wound condition at [phone]. [place], [place] ([id number]) Please direct any NON-WOUND related issues/requests for orders to patient's Primary Care Physician We will continue to use Prisma AG and a light border dressing. She will also be careful to make sure she has enough of protection around her feet especially when she sleeps at night. Electronic Signature(s) Signed: 01/25/2015 4:41:50 PM By:  Christin Fudge MD, FACS Entered By: Christin Fudge on 01/25/2015 08:54:24 [name] ([id number]) -------------------------------------------------------------------------------- SuperBill Details Patient Name: [name] Date of Service: 01/25/2015 Medical Record Number: [id number] Patient Account Number: [id number] Date of Birth/Sex: 12/26/1927 (81 y.o. 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 S91.104A Unspecified open wound of right lesser toe(s) without damage to nail, initial encounter Facility Procedures CPT4 Code Description: [id number] - DEBRIDE WOUND 1ST 20 SQ CM OR < ICD-10 Description Diagnosis L97.422 Non-pressure chronic ulcer of left heel and midfoot with L97.412 Non-pressure chronic ulcer of right heel and midfoot wit Modifier: fat layer expose h fat layer expos Quantity: 1 d ed Physician Procedures CPT4 Code Description: [id number] - WC PHYS DEBR WO ANESTH 20 SQ CM ICD-10 Description Diagnosis L97.422 Non-pressure chronic ulcer  of left heel and midfoot with L97.412 Non-pressure chronic ulcer of right heel and midfoot wit Modifier: fat layer expose h fat layer expos Quantity: 1 d ed Electronic Signature(s) Signed: 01/25/2015 4:41:50 PM By: Christin Fudge MD, FACS Entered By: Christin Fudge on 01/25/2015 08:54:41

## 2015-01-26 NOTE — Progress Notes (Signed)
[name], [name] ([id number]) Visit Report for 01/25/2015 Arrival Information Details Patient Name: [name] Date of Service: 01/25/2015 8:15 AM Medical Record Number: [id number] Patient Account Number: [id number] Date of Birth/Sex: 12/20/1927 (81 y.o. Female) Treating RN: Afful, RN, BSN, Velva Harman Primary Care Physician: Constance Goltz Other Clinician: Referring Physician: Constance Goltz Treating Physician/Extender: Frann Rider in Treatment: 71 Visit Information History Since Last Visit Any new allergies or adverse reactions: No Patient Arrived: Wheel Chair Had a fall or experienced change in No Arrival Time: 08:07 activities of daily living that may affect Accompanied By: self risk of falls: Transfer Assistance: None Signs or symptoms of abuse/neglect since last No Patient Identification Verified: Yes visito Secondary Verification Process Yes Hospitalized since last visit: No Completed: Has Dressing in Place as Prescribed: Yes Patient Requires Transmission- No Pain Present Now: No Based Precautions: Patient Has Alerts: Yes Patient Alerts: Patient on Blood Thinner DMII Plavix ABI L 0.63 R 0.79 Electronic Signature(s) Signed: 01/25/2015 4:05:06 PM By: Regan Lemming BSN, RN Entered By: Regan Lemming on 01/25/2015 08:11:39 [name] ([id number]) -------------------------------------------------------------------------------- Encounter Discharge Information Details Patient Name: [name] Date of Service: 01/25/2015 8:15 AM Medical Record Number: [id number] Patient Account Number: [id number] Date of Birth/Sex: Dec 27, 1927 (81 y.o. Female) Treating RN: Baruch Gouty, RN, BSN, Velva Harman Primary Care Physician: Constance Goltz Other Clinician: Referring Physician: Constance Goltz Treating Physician/Extender: Frann Rider in Treatment: 53 Encounter Discharge Information Items Discharge Pain Level: 0 Discharge Condition: Stable Ambulatory Status: Wheelchair Discharge  Destination: Home Transportation: Private Auto Accompanied By: self Schedule Follow-up Appointment: No Medication Reconciliation completed No and provided to Patient/Care Joseantonio Dittmar: Provided on Clinical Summary of Care: 01/25/2015 Form Type Recipient Paper Patient DJ Electronic Signature(s) Signed: 01/25/2015 5:03:06 PM By: Montey Hora Previous Signature: 01/25/2015 8:58:04 AM Version By: Ruthine Dose Entered By: Montey Hora on 01/25/2015 10:03:28 [name] ([id number]) -------------------------------------------------------------------------------- Lower Extremity Assessment Details Patient Name: [name] Date of Service: 01/25/2015 8:15 AM Medical Record Number: [id number] Patient Account Number: [id number] Date of Birth/Sex: August 12, 1928 (81 y.o. Female) Treating RN: Afful, RN, BSN, Velva Harman Primary Care Physician: Constance Goltz Other Clinician: Referring Physician: Constance Goltz Treating Physician/Extender: Frann Rider in Treatment: 35 Vascular Assessment Pulses: Posterior Tibial Dorsalis Pedis Palpable: [Left:No] [Right:No] Doppler: [Left:Monophasic] [Right:Monophasic] Extremity colors, hair growth, and conditions: Extremity Color: [Left:Dusky] [Right:Dusky] Hair Growth on Extremity: [Left:No] [Right:No] Temperature of Extremity: [Left:Warm] [Right:Warm] Capillary Refill: [Left:< 3 seconds] [Right:< 3 seconds] Dependent Rubor: [Left:No] [Right:No] Blanched when Elevated: [Left:No] [Right:No] Lipodermatosclerosis: [Left:No] [Right:No] Toe Nail Assessment Left: Right: Thick: No No Discolored: No No Deformed: No No Improper Length and Hygiene: No No Electronic Signature(s) Signed: 01/25/2015 4:05:06 PM By: Regan Lemming BSN, RN Entered By: Regan Lemming on 01/25/2015 08:17:36 [name] ([id number]) -------------------------------------------------------------------------------- Multi Wound Chart Details Patient Name: [name] Date of Service:  01/25/2015 8:15 AM Medical Record Number: [id number] Patient Account Number: [id number] Date of Birth/Sex: Feb 04, 1928 (81 y.o. Female) Treating RN: Montey Hora Primary Care Physician: Constance Goltz Other Clinician: Referring Physician: Constance Goltz Treating Physician/Extender: Frann Rider in Treatment: 19 Vital Signs Height(in): 64 Pulse(bpm): 70 Weight(lbs): 180 Blood Pressure 142/54 (mmHg): Body Mass Index(BMI): 31 Temperature(F): 98.5 Respiratory Rate 17 (breaths/min): Photos: [1:No Photos] [5:No Photos] [6:No Photos] Wound Location: [1:Left Calcaneous - Lateral Right Calcaneous] [6:Left Achilles] Wounding Event: [1:Pressure Injury] [5:Gradually Appeared] [6:Gradually Appeared] Primary Etiology: [1:Arterial Insufficiency Ulcer Arterial Insufficiency Ulcer Arterial Insufficiency Ulcer] Secondary Etiology: [1:Pressure Ulcer] [5:N/A] [6:N/A] Comorbid History: [1:Glaucoma, Asthma, Hypertension, Peripheral Hypertension, Peripheral Hypertension, Peripheral Venous Disease, Type II Venous Disease, Type  II Venous Disease, Type II Diabetes] [5:Glaucoma, Asthma, Diabetes] [6:Glaucoma, Asthma,  Diabetes] Date Acquired: [1:07/24/2014] [5:09/26/2014] [6:09/26/2014] Weeks of Treatment: [1:19] [5:16] [6:16] Wound Status: [1:Open] [5:Open] [6:Open] Measurements L x W x D 0.7x0.8x0.3 [5:0.8x1x0.2] [6:1.5x1.4x0.1] (cm) Area (cm) : [1:0.44] [5:0.628] [6:1.649] Volume (cm) : [1:0.132] [5:0.126] [6:0.165] % Reduction in Area: [1:91.40%] [5:88.60%] [6:56.30%] % Reduction in Volume: 93.50% [5:77.10%] [6:91.20%] Classification: [1:Partial Thickness] [5:Full Thickness Without Exposed Support Structures] [6:Partial Thickness] HBO Classification: [1:Grade 1] [5:Grade 1] [6:Grade 1] Exudate Amount: [1:Small] [5:Small] [6:Small] Exudate Type: [1:Serous] [5:Serosanguineous] [6:Serous] Exudate Color: [1:amber] [5:red, brown] [6:amber] Wound Margin: [1:Distinct, outline attached Distinct, outline  attached Distinct, outline attached] Granulation Amount: [1:Large (67-100%)] [5:Large (67-100%)] [6:Large (67-100%)] Granulation Quality: [1:Pink] [5:Pink] [6:Pink] Necrotic Amount: [1:Small (1-33%)] [5:Small (1-33%)] [6:None Present (0%)] Exposed Structures: [name], [name] ([id number]) Fascia: No Fascia: No Fascia: No Fat: No Fat: No Fat: No Tendon: No Tendon: No Tendon: No Muscle: No Muscle: No Muscle: No Joint: No Joint: No Joint: No Bone: No Bone: No Bone: No Limited to Skin Limited to Skin Limited to Skin Breakdown Breakdown Breakdown Epithelialization: Medium (34-66%) Medium (34-66%) Medium (34-66%) Periwound Skin Texture: Edema: Yes Edema: Yes Edema: Yes Excoriation: No Excoriation: No Excoriation: No Induration: No Induration: No Induration: No Callus: No Callus: No Callus: No Crepitus: No Crepitus: No Crepitus: No Fluctuance: No Fluctuance: No Fluctuance: No Friable: No Friable: No Friable: No Rash: No Rash: No Rash: No Scarring: No Scarring: No Scarring: No Periwound Skin Moist: Yes Moist: Yes Dry/Scaly: Yes Moisture: Maceration: No Dry/Scaly: Yes Maceration: No Dry/Scaly: No Maceration: No Moist: No Periwound Skin Color: Atrophie Blanche: No Hemosiderin Staining: Yes Hemosiderin Staining: Yes Cyanosis: No Atrophie Blanche: No Atrophie Blanche: No Ecchymosis: No Cyanosis: No Cyanosis: No Erythema: No Ecchymosis: No Ecchymosis: No Hemosiderin Staining: No Erythema: No Erythema: No Mottled: No Mottled: No Mottled: No Pallor: No Pallor: No Pallor: No Rubor: No Rubor: No Rubor: No Temperature: No Abnormality No Abnormality No Abnormality Tenderness on Yes Yes Yes Palpation: Wound Preparation: Ulcer Cleansing: Ulcer Cleansing: Ulcer Cleansing: Rinsed/Irrigated with Rinsed/Irrigated with Rinsed/Irrigated with Saline Saline Saline Topical Anesthetic Topical Anesthetic Topical Anesthetic Applied: Other: Lidocaine Applied:  Other: Lidocaine Applied: Other: lidocaine 4% Cream 4% Ointment 4% Treatment Notes Electronic Signature(s) Signed: 01/25/2015 5:03:06 PM By: Montey Hora Entered By: Montey Hora on 01/25/2015 08:43:56 [place], [name] ([id number]) -------------------------------------------------------------------------------- Multi-Disciplinary Care Plan Details Patient Name: [name] Date of Service: 01/25/2015 8:15 AM Medical Record Number: [id number] Patient Account Number: [id number] Date of Birth/Sex: 06-06-1928 (81 y.o. Female) Treating RN: Montey Hora Primary Care Physician: Constance Goltz Other Clinician: Referring Physician: Constance Goltz Treating Physician/Extender: Frann Rider in Treatment: 72 Active Inactive Abuse / Safety / Falls / Self Care Management Nursing Diagnoses: Impaired physical mobility Potential for falls Goals: Patient will remain injury free Date Initiated: 09/12/2014 Goal Status: Active Patient/caregiver will verbalize understanding of skin care regimen Date Initiated: 09/12/2014 Goal Status: Active Patient/caregiver will verbalize/demonstrate measures taken to prevent injury and/or falls Date Initiated: 09/12/2014 Goal Status: Active Patient/caregiver will verbalize/demonstrate understanding of what to do in case of emergency Date Initiated: 09/12/2014 Goal Status: Active Interventions: Assess fall risk on admission and as needed Assess: immobility, friction, shearing, incontinence upon admission and as needed Assess impairment of mobility on admission and as needed per policy Provide education on basic hygiene Provide education on fall prevention Provide education on personal and home safety Provide education on safe transfers Treatment Activities: Education provided on Basic Hygiene : 10/12/2014 Notes: [place], [place] ([id number])  Nursing Diagnoses: Impaired tissue integrity related to necrotic/devitalized tissue Knowledge  deficit related to management of necrotic/devitalized tissue Goals: Necrotic/devitalized tissue will be minimized in the wound bed Date Initiated: 09/12/2014 Goal Status: Active Patient/caregiver will verbalize understanding of reason and process for debridement of necrotic tissue Date Initiated: 09/12/2014 Goal Status: Active Interventions: Assess patient pain level pre-, during and post procedure and prior to discharge Provide education on necrotic tissue and debridement process Treatment Activities: Apply topical anesthetic as ordered : 01/25/2015 Enzymatic debridement : 01/25/2015 Excisional debridement : 01/25/2015 Notes: Orientation to the Wound Care Program Nursing Diagnoses: Knowledge deficit related to the wound healing center program Goals: Patient/caregiver will verbalize understanding of the [place] [facility] Date Initiated: 09/12/2014 Goal Status: Active Interventions: Provide education on orientation to the wound center Notes: Pressure Nursing Diagnoses: Knowledge deficit related to causes and risk factors for pressure ulcer development Knowledge deficit related to management of pressures ulcers Potential for impaired tissue integrity related to pressure, friction, moisture, and shear GoalsTAKEIRA, [name] ([id number]) Patient will remain free from development of additional pressure ulcers Date Initiated: 09/12/2014 Goal Status: Active Patient will remain free of pressure ulcers Date Initiated: 09/12/2014 Goal Status: Active Patient/caregiver will verbalize risk factors for pressure ulcer development Date Initiated: 09/12/2014 Goal Status: Active Patient/caregiver will verbalize understanding of pressure ulcer management Date Initiated: 09/12/2014 Goal Status: Active Interventions: Assess: immobility, friction, shearing, incontinence upon admission and as needed Assess offloading mechanisms upon admission and as needed Assess potential for pressure ulcer  upon admission and as needed Provide education on pressure ulcers Treatment Activities: Pressure reduction/relief device ordered : 01/25/2015 Notes: Wound/Skin Impairment Nursing Diagnoses: Impaired tissue integrity Knowledge deficit related to ulceration/compromised skin integrity Goals: Patient/caregiver will verbalize understanding of skin care regimen Date Initiated: 09/12/2014 Goal Status: Active Ulcer/skin breakdown will heal within 14 weeks Date Initiated: 09/12/2014 Goal Status: Active Interventions: Assess patient/caregiver ability to obtain necessary supplies Assess patient/caregiver ability to perform ulcer/skin care regimen upon admission and as needed Assess ulceration(s) every visit Provide education on ulcer and skin care Treatment Activities: Skin care regimen initiated : 01/25/2015 [name], [name] ([id number]) Topical wound management initiated : 01/25/2015 Notes: Electronic Signature(s) Signed: 01/25/2015 5:03:06 PM By: Montey Hora Entered By: Montey Hora on 01/25/2015 08:43:42 [place], [name] ([id number]) -------------------------------------------------------------------------------- Pain Assessment Details Patient Name: [name] Date of Service: 01/25/2015 8:15 AM Medical Record Number: [id number] Patient Account Number: [id number] Date of Birth/Sex: [birth year] (81 y.o. Female) Treating RN: Baruch Gouty, RN, BSN, Velva Harman Primary Care Physician: Constance Goltz Other Clinician: Referring Physician: Constance Goltz Treating Physician/Extender: Frann Rider in Treatment: 48 Active Problems Location of Pain Severity and Description of Pain Patient Has Paino Yes Site Locations Rate the pain. Current Pain Level: 5 Character of Pain Describe the Pain: Aching, Tender Pain Management and Medication Current Pain Management: Medication: Yes Cold Application: No Rest: No Massage: No Activity: No T.E.N.S.: No Heat Application: No Leg drop or elevation: No Is  the Current Pain Management Inadequate Adequate: How does your pain impact your activities of daily livingo Sleep: Yes Bathing: Yes Appetite: Yes Relationship With Others: Yes Bladder Continence: Yes Emotions: Yes Bowel Continence: Yes Work: Yes Toileting: Yes Drive: Yes Dressing: Yes Hobbies: Yes Electronic Signature(s) Signed: 01/25/2015 4:05:06 PM By: Regan Lemming BSN, RN [name], Sharnelle ([id number]) Entered By: Regan Lemming on 01/25/2015 08:12:07 [name] ([id number]) -------------------------------------------------------------------------------- Patient/Caregiver Education Details Patient Name: [name] Date of Service: 01/25/2015 8:15 AM Medical Record Number: [id number] Patient Account Number: [id number] Date of Birth/Gender: 07-05-1928 (81 y.o.  Female) Treating RN: Montey Hora Primary Care Physician: Constance Goltz Other Clinician: Referring Physician: Constance Goltz Treating Physician/Extender: Frann Rider in Treatment: 56 Education Assessment Education Provided To: Patient Education Topics Provided Wound/Skin Impairment: Handouts: Other: wound care as ordered Methods: Demonstration, Explain/Verbal Responses: State content correctly Electronic Signature(s) Signed: 01/25/2015 5:03:06 PM By: Montey Hora Entered By: Montey Hora on 01/25/2015 08:52:16 [place], Christella ([id number]) -------------------------------------------------------------------------------- Wound Assessment Details Patient Name: [name] Date of Service: 01/25/2015 8:15 AM Medical Record Number: [id number] Patient Account Number: [id number] Date of Birth/Sex: [birth year] (81 y.o. Female) Treating RN: Afful, RN, BSN, [place] Primary Care Physician: Constance Goltz Other Clinician: Referring Physician: Constance Goltz Treating Physician/Extender: Frann Rider in Treatment: 19 Wound Status Wound Number: 1 Primary Arterial Insufficiency Ulcer Etiology: Wound Location:  Left Calcaneous - Lateral Secondary Pressure Ulcer Wounding Event: Pressure Injury Etiology: Date Acquired: 07/24/2014 Wound Open Weeks Of Treatment: 19 Status: Clustered Wound: No Comorbid Glaucoma, Asthma, Hypertension, History: Peripheral Venous Disease, Type II Diabetes Photos Photo Uploaded By: Regan Lemming on 01/25/2015 12:16:23 Wound Measurements Length: (cm) 0.7 Width: (cm) 0.8 Depth: (cm) 0.3 Area: (cm) 0.44 Volume: (cm) 0.132 % Reduction in Area: 91.4% % Reduction in Volume: 93.5% Epithelialization: Medium (34-66%) Tunneling: No Undermining: No Wound Description Classification: Partial Thickness Foul O Diabetic Severity (Wagner): Grade 1 Wound Margin: Distinct, outline attached Exudate Amount: Small Exudate Type: Serous Exudate Color: amber dor After Cleansing: No Wound Bed Granulation Amount: Large (67-100%) Exposed Structure [place], [name] ([id number]) Granulation Quality: Pink Fascia Exposed: No Necrotic Amount: Small (1-33%) Fat Layer Exposed: No Necrotic Quality: Adherent Slough Tendon Exposed: No Muscle Exposed: No Joint Exposed: No Bone Exposed: No Limited to Skin Breakdown Periwound Skin Texture Texture Color No Abnormalities Noted: No No Abnormalities Noted: No Callus: No Atrophie Blanche: No Crepitus: No Cyanosis: No Excoriation: No Ecchymosis: No Fluctuance: No Erythema: No Friable: No Hemosiderin Staining: No Induration: No Mottled: No Localized Edema: Yes Pallor: No Rash: No Rubor: No Scarring: No Temperature / Pain Moisture Temperature: No Abnormality No Abnormalities Noted: No Tenderness on Palpation: Yes Dry / Scaly: No Maceration: No Moist: Yes Wound Preparation Ulcer Cleansing: Rinsed/Irrigated with Saline Topical Anesthetic Applied: Other: Lidocaine 4% Cream, Treatment Notes Wound #1 (Left, Lateral Calcaneous) 1. Cleansed with: Clean wound with Normal Saline 2. Anesthetic Topical Lidocaine 4% cream to  wound bed prior to debridement 4. Dressing Applied: Prisma Ag 5. Secondary Dressing Applied ABD and Kerlix/Conform 7. Secured with Recruitment consultant) Signed: 01/25/2015 4:05:06 PM By: Regan Lemming BSN, RN Entered By: Regan Lemming on 01/25/2015 08:21:12 [name], [name] ([id number]) [place], [name] ([id number]) -------------------------------------------------------------------------------- Wound Assessment Details Patient Name: [name] Date of Service: 01/25/2015 8:15 AM Medical Record Number: [id number] Patient Account Number: [id number] Date of Birth/Sex: [birth year] (81 y.o. Female) Treating RN: Afful, RN, BSN, Velva Harman Primary Care Physician: Constance Goltz Other Clinician: Referring Physician: Constance Goltz Treating Physician/Extender: Frann Rider in Treatment: 19 Wound Status Wound Number: 5 Primary Arterial Insufficiency Ulcer Etiology: Wound Location: Right Calcaneous Wound Open Wounding Event: Gradually Appeared Status: Date Acquired: 09/26/2014 Comorbid Glaucoma, Asthma, Hypertension, Weeks Of Treatment: 16 History: Peripheral Venous Disease, Type II Clustered Wound: No Diabetes Photos Photo Uploaded By: Regan Lemming on 01/25/2015 12:16:23 Wound Measurements Length: (cm) 0.8 Width: (cm) 1 Depth: (cm) 0.2 Area: (cm) 0.628 Volume: (cm) 0.126 % Reduction in Area: 88.6% % Reduction in Volume: 77.1% Epithelialization: Medium (34-66%) Tunneling: No Undermining: No Wound Description Full Thickness Without Exposed Foul Odor A Classification: Support Structures Diabetic Severity Grade 1 (Wagner): Wound  Margin: Distinct, outline attached Exudate Amount: Small Exudate Type: Serosanguineous Exudate Color: red, brown fter Cleansing: No Wound Bed Granulation Amount: Large (67-100%) Exposed Structure [place], [name] ([id number]) Granulation Quality: Pink Fascia Exposed: No Necrotic Amount: Small (1-33%) Fat Layer Exposed: No Necrotic Quality:  Adherent Slough Tendon Exposed: No Muscle Exposed: No Joint Exposed: No Bone Exposed: No Limited to Skin Breakdown Periwound Skin Texture Texture Color No Abnormalities Noted: No No Abnormalities Noted: No Callus: No Atrophie Blanche: No Crepitus: No Cyanosis: No Excoriation: No Ecchymosis: No Fluctuance: No Erythema: No Friable: No Hemosiderin Staining: Yes Induration: No Mottled: No Localized Edema: Yes Pallor: No Rash: No Rubor: No Scarring: No Temperature / Pain Moisture Temperature: No Abnormality No Abnormalities Noted: No Tenderness on Palpation: Yes Dry / Scaly: Yes Maceration: No Moist: Yes Wound Preparation Ulcer Cleansing: Rinsed/Irrigated with Saline Topical Anesthetic Applied: Other: Lidocaine 4% Ointment, Treatment Notes Wound #5 (Right Calcaneous) 1. Cleansed with: Clean wound with Normal Saline 2. Anesthetic Topical Lidocaine 4% cream to wound bed prior to debridement 4. Dressing Applied: Prisma Ag 5. Secondary Dressing Applied ABD and Kerlix/Conform 7. Secured with Recruitment consultant) Signed: 01/25/2015 4:05:06 PM By: Regan Lemming BSN, RN Entered By: Regan Lemming on 01/25/2015 08:22:06 [name], [name] ([id number]) [place], [name] ([id number]) -------------------------------------------------------------------------------- Wound Assessment Details Patient Name: [name] Date of Service: 01/25/2015 8:15 AM Medical Record Number: [id number] Patient Account Number: [id number] Date of Birth/Sex: [birth year] (81 y.o. Female) Treating RN: Afful, RN, BSN, Velva Harman Primary Care Physician: Constance Goltz Other Clinician: Referring Physician: Constance Goltz Treating Physician/Extender: Frann Rider in Treatment: 19 Wound Status Wound Number: 6 Primary Arterial Insufficiency Ulcer Etiology: Wound Location: Left Achilles Wound Open Wounding Event: Gradually Appeared Status: Date Acquired: 09/26/2014 Comorbid Glaucoma, Asthma,  Hypertension, Weeks Of Treatment: 16 History: Peripheral Venous Disease, Type II Clustered Wound: No Diabetes Photos Photo Uploaded By: Regan Lemming on 01/25/2015 12:16:23 Wound Measurements Length: (cm) 1.5 Width: (cm) 1.4 Depth: (cm) 0.1 Area: (cm) 1.649 Volume: (cm) 0.165 % Reduction in Area: 56.3% % Reduction in Volume: 91.2% Epithelialization: Medium (34-66%) Tunneling: No Undermining: No Wound Description Classification: Partial Thickness Foul O Diabetic Severity (Wagner): Grade 1 Wound Margin: Distinct, outline attached Exudate Amount: Small Exudate Type: Serous Exudate Color: amber dor After Cleansing: No Wound Bed Granulation Amount: Large (67-100%) Exposed Structure Granulation Quality: Pink Fascia Exposed: No Necrotic Amount: None Present (0%) Fat Layer Exposed: No [place], [name] ([id number]) Tendon Exposed: No Muscle Exposed: No Joint Exposed: No Bone Exposed: No Limited to Skin Breakdown Periwound Skin Texture Texture Color No Abnormalities Noted: No No Abnormalities Noted: No Callus: No Atrophie Blanche: No Crepitus: No Cyanosis: No Excoriation: No Ecchymosis: No Fluctuance: No Erythema: No Friable: No Hemosiderin Staining: Yes Induration: No Mottled: No Localized Edema: Yes Pallor: No Rash: No Rubor: No Scarring: No Temperature / Pain Moisture Temperature: No Abnormality No Abnormalities Noted: No Tenderness on Palpation: Yes Dry / Scaly: Yes Maceration: No Moist: No Wound Preparation Ulcer Cleansing: Rinsed/Irrigated with Saline Topical Anesthetic Applied: Other: lidocaine 4%, Treatment Notes Wound #6 (Left Achilles) 1. Cleansed with: Clean wound with Normal Saline 2. Anesthetic Topical Lidocaine 4% cream to wound bed prior to debridement 4. Dressing Applied: Prisma Ag 5. Secondary Dressing Applied ABD and Kerlix/Conform 7. Secured with Recruitment consultant) Signed: 01/25/2015 4:05:06 PM By: Regan Lemming BSN,  RN Entered By: Regan Lemming on 01/25/2015 08:22:21 [name] ([id number]) -------------------------------------------------------------------------------- Vitals Details Patient Name: [name] Date of Service: 01/25/2015 8:15 AM Medical Record Number: [id number] Patient Account Number: [id number] Date  of Birth/Sex: 04/05/1928 (81 y.o. Female) Treating RN: Afful, RN, BSN, Velva Harman Primary Care Physician: Constance Goltz Other Clinician: Referring Physician: Constance Goltz Treating Physician/Extender: Frann Rider in Treatment: 19 Vital Signs Time Taken: 08:12 Temperature (F): 98.5 Height (in): 64 Pulse (bpm): 70 Weight (lbs): 180 Respiratory Rate (breaths/min): 17 Body Mass Index (BMI): 30.9 Blood Pressure (mmHg): 142/54 Reference Range: 80 - 120 mg / dl Electronic Signature(s) Signed: 01/25/2015 4:05:06 PM By: Regan Lemming BSN, RN Entered By: Regan Lemming on 01/25/2015 08:12:30

## 2015-02-01 ENCOUNTER — Encounter: Payer: Medicare Other | Admitting: Surgery

## 2015-02-01 DIAGNOSIS — L97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: Secondary | ICD-10-CM | POA: Diagnosis not present

## 2015-02-01 NOTE — Progress Notes (Addendum)
[name] ([id number]) Visit Report for 02/01/2015 Chief Complaint Document Details Patient Name: [name], [name] Date of Service: 02/01/2015 9:30 AM Medical Record Number: [id number] Patient Account Number: [id number] Date of Birth/Sex: 1928-07-25 (82 y.o. Female) Treating RN: Primary Care Physician: Constance Goltz Other Clinician: Referring Physician: Constance Goltz Treating Physician/Extender: Frann Rider in Treatment: 20 Information Obtained from: Patient Chief Complaint Patient presents to the wound care center today with an open arterial ulcer on the left foot big toe and lateral heal. She also has some superficial ulcerations on the second and third toe dorsum. she has no family member with her today and she is a very poor historian and cannot give a proper history. As stated that she walks around with a walker. Electronic Signature(s) Signed: 02/01/2015 1:17:45 PM By: Christin Fudge MD, FACS Entered By: Christin Fudge on 02/01/2015 10:03:01 [name] ([id number]) -------------------------------------------------------------------------------- Debridement Details Patient Name: [name] Date of Service: 02/01/2015 9:30 AM Medical Record Number: [id number] Patient Account Number: [id number] Date of Birth/Sex: [birth year] (81 y.o. Female) Treating RN: Primary Care Physician: Constance Goltz Other Clinician: Referring Physician: Constance Goltz Treating Physician/Extender: Frann Rider in Treatment: 20 Debridement Performed for Wound #1 Left,Lateral Calcaneous Assessment: Performed By: Physician [name]., MD Debridement: Debridement Pre-procedure Yes Verification/Time Out Taken: Start Time: 09:57 Pain Control: Lidocaine 4% Topical Solution Level: Skin/Subcutaneous Tissue Total Area Debrided (L x 0.7 (cm) x 0.5 (cm) = 0.35 (cm) W): Tissue and other Viable, Non-Viable, Exudate, Fibrin/Slough, Subcutaneous material debrided: Instrument:  Curette Bleeding: Minimum Hemostasis Achieved: Pressure End Time: 09:58 Procedural Pain: 0 Post Procedural Pain: 0 Response to Treatment: Procedure was tolerated well Post Debridement Measurements of Total Wound Length: (cm) 0.7 Width: (cm) 0.5 Depth: (cm) 0.2 Volume: (cm) 0.055 Electronic Signature(s) Signed: 02/01/2015 1:17:45 PM By: Christin Fudge MD, FACS Entered By: Christin Fudge on 02/01/2015 10:02:14 [name] ([id number]) -------------------------------------------------------------------------------- Debridement Details Patient Name: [name] Date of Service: 02/01/2015 9:30 AM Medical Record Number: [id number] Patient Account Number: [id number] Date of Birth/Sex: 09-Jan-1928 (81 y.o. Female) Treating RN: Primary Care Physician: Constance Goltz Other Clinician: Referring Physician: Constance Goltz Treating Physician/Extender: Frann Rider in Treatment: 20 Debridement Performed for Wound #5 Right Calcaneous Assessment: Performed By: Physician [name]., MD Debridement: Open Wound/Selective Debridement Selective Description: Pre-procedure Yes Verification/Time Out Taken: Start Time: 09:56 Pain Control: Lidocaine 4% Topical Solution Level: Non-Viable Tissue Total Area Debrided (L x 1.1 (cm) x 0.8 (cm) = 0.88 (cm) W): Tissue and other Non-Viable, Exudate, Fibrin/Slough material debrided: Instrument: Curette Bleeding: Minimum Hemostasis Achieved: Pressure End Time: 09:56 Procedural Pain: 0 Post Procedural Pain: 0 Response to Treatment: Procedure was tolerated well Post Debridement Measurements of Total Wound Length: (cm) 1.1 Width: (cm) 0.8 Depth: (cm) 0.2 Volume: (cm) 0.138 Electronic Signature(s) Signed: 02/01/2015 1:17:45 PM By: Christin Fudge MD, FACS Entered By: Christin Fudge on 02/01/2015 10:02:35 [name] ([id number]) -------------------------------------------------------------------------------- Debridement  Details Patient Name: [name] Date of Service: 02/01/2015 9:30 AM Medical Record Number: [id number] Patient Account Number: [id number] Date of Birth/Sex: Feb 05, 1928 (81 y.o. Female) Treating RN: Primary Care Physician: Constance Goltz Other Clinician: Referring Physician: Constance Goltz Treating Physician/Extender: Frann Rider in Treatment: 20 Debridement Performed for Wound #6 Left Achilles Assessment: Performed By: Physician [name]., MD Debridement: Open Wound/Selective Debridement Selective Description: Pre-procedure Yes Verification/Time Out Taken: Start Time: 09:59 Pain Control: Lidocaine 4% Topical Solution Level: Non-Viable Tissue Total Area Debrided (L x 1.4 (cm) x 1.3 (cm) = 1.82 (cm) W): Tissue and other Non-Viable, Eschar, Exudate,  Fibrin/Slough material debrided: Instrument: Curette Bleeding: Minimum Hemostasis Achieved: Pressure End Time: 09:59 Procedural Pain: 0 Post Procedural Pain: 0 Response to Treatment: Procedure was tolerated well Post Debridement Measurements of Total Wound Length: (cm) 1.4 Width: (cm) 1.3 Depth: (cm) 0.1 Volume: (cm) 0.143 Electronic Signature(s) Signed: 02/01/2015 1:17:45 PM By: Christin Fudge MD, FACS Entered By: Christin Fudge on 02/01/2015 10:02:52 [name] ([id number]) -------------------------------------------------------------------------------- HPI Details Patient Name: [name] Date of Service: 02/01/2015 9:30 AM Medical Record Number: [id number] Patient Account Number: [id number] Date of Birth/Sex: 1928/03/24 (81 y.o. Female) Treating RN: Primary Care Physician: Constance Goltz Other Clinician: Referring Physician: Constance Goltz Treating Physician/Extender: Frann Rider in Treatment: 20 History of Present Illness Location: left heel Quality: Patient reports experiencing a dull pain to affected area(s). Severity: Patient states wound are getting worse. Duration: Patient states  that they are not certain how long the wound has been present. Timing: Pain in wound is Intermittent (comes and goes Context: The wound appeared gradually over time Associated Signs and Symptoms: Patient reports having difficulty standing for long periods. HPI Description: This 81 year old patient is a poor historian and comes from a nursing home. Does not have any family members with her. Says she has a ulcer on the lateral part of her left foot and some new ones are there on her right foot too. She is unable to say how long she's had these. she does walk with a walker and this is limited most of the day. Does not recall if she has had any vascular studies done. I have reviewed an x-ray done of the left foot which basically shows osteoporosis but no evidence of fracture dislocation or osteomyelitis. 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 She seems to be doing better overall. 10/19/14 -- I understand she has gone to the vascular surgery and lab yesterday and has had all her tests done yesterday. She is doing fine otherwise and has had no fresh issues. 10/26/2014 she seems to be doing well and has no fresh problems and seems much brighter. 10/12/14 -- Taking her antibiotic and continues to do her dressings locally and she is helped by her skilled nursing. She seems to be doing fine and has no fresh issues. 10/19/14 -- she is doing fine and has had no fresh issues. She says her son to go for her vascular workup yesterday and she has been called back after 3 months. we will try and gather these reports to review what they said. 10/26/14 --I have been able to review notes from the vascular surgery office and these were dated from 10/18/2014. He patient was on her first postop visit and had Doppler ultrasounds done of her arteries.  She had more than 50% stenosis of the right superficial femoral artery and more than 50% stenosis in the left tibioperoneal trunk. She was status post a left lower extremity angiogram with angioplasty of the peroneal and left superficial femoral arteries for a nonhealing left foot and ankle ulceration. note is made of the fact that the ABIs had not changed in the last month since her previous visit. The opinion of her provider was that she did not need any intervention at this time and they had done everything they could at the present time. They would continue on Plavix and antibiotics and see her back in 3  months for a another arterial duplex study of the lower extremities. KAHLYNN, OLDAKER ([id number]) 11/02/2014 -- she has spoken to her caregivers about coming for HBO 5 times per week for 6-8 weeks and they are agreeable about this and she is willing to undergo hyperbaric oxygen therapy. 11/16/2014 she has been worked up with a EKG and chest x-ray and these were within normal limits. As far as the investigations go she is ready for HBOT. we are awaiting some insurance clearance so that she can start on her hyperbaric oxygen therapy. 11/30/2014 she tolerated hyperbaric oxygen therapy fine and there were no issues with her blood glucose levels. As noted she is not a diabetic and most of her fingerstick blood glucose levels are inaccurate because of the Raynaud's phenomena. She normally has to have your lobe sticks to get any random glucose levels. I have also requested her primary care does a venous blood glucose draw to check her glucose level. 12/21/2014 -- she is tolerating hyperbaric oxygen very well and has overall made a lot of progress. Her left leg is looking much better. 01/11/2015 - No new complaints today. New ulceration on the dorsum of the right second toe noted today on physical exam. Patient unaware. No significant pain. No fever or chills. No significant drainage.  Offloading with sage boots at night. Significantly improved with HBO. 01/19/2015 - the patient has had a x-ray of the right foot done at the nursing home but we still do not have the report and we are awaiting this result. She is otherwise doing fine with her general health. 01/25/2015 -- X-ray of her right foot showed no acute fracture, dislocation, bony infection or osteomyelitis. The was some soft tissue swelling and osteopenia seen. Electronic Signature(s) Signed: 02/01/2015 1:17:45 PM By: Christin Fudge MD, FACS Entered By: Christin Fudge on 02/01/2015 10:03:08 [name] ([id number]) -------------------------------------------------------------------------------- Physical Exam Details Patient Name: [name] Date of Service: 02/01/2015 9:30 AM Medical Record Number: [id number] Patient Account Number: [id number] Date of Birth/Sex: 12/11/1927 (81 y.o. Female) Treating RN: Primary Care Physician: Constance Goltz Other Clinician: Referring Physician: Constance Goltz Treating Physician/Extender: Frann Rider in Treatment: 20 Constitutional . Pulse regular. Respirations normal and unlabored. Afebrile. . Eyes Nonicteric. Reactive to light. Ears, Nose, Mouth, and Throat Lips, teeth, and gums WNL.Marland Kitchen Moist mucosa without lesions . Neck supple and nontender. No palpable supraclavicular or cervical adenopathy. Normal sized without goiter. Respiratory WNL. No retractions.. Cardiovascular Pedal Pulses WNL. No clubbing, cyanosis or edema. Integumentary (Hair, Skin) once all looking pretty good and some of them have superficial debris which is easily removed with sharp dissection. The lateral calcaneal the left leg has some deeper debridement and will need sharp debridement with a curette.Marland Kitchen No crepitus or fluctuance. No peri-wound warmth or erythema. No masses.Marland Kitchen Psychiatric Judgement and insight Intact.. No evidence of depression, anxiety, or agitation.. Electronic  Signature(s) Signed: 02/01/2015 1:17:45 PM By: Christin Fudge MD, FACS Entered By: Christin Fudge on 02/01/2015 10:06:09 [name] ([id number]) -------------------------------------------------------------------------------- Physician Orders Details Patient Name: [name] Date of Service: 02/01/2015 9:30 AM Medical Record Number: [id number] Patient Account Number: [id number] Date of Birth/Sex: 28-Dec-1927 (81 y.o. 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 - be sure to clean out all remaining prisma Wound #5 Right Calcaneous o Clean wound with Normal Saline. - be sure to clean out all remaining prisma Wound #6 Left Achilles o Clean wound with Normal Saline. - be sure to clean out all remaining prisma Anesthetic Wound #1 Left,Lateral Calcaneous o Topical Lidocaine 4% cream applied to wound bed prior to debridement o Hurricaine Topical Anesthetic Spray applied to wound bed prior to debridement Wound #5 Right Calcaneous o Topical Lidocaine 4% cream applied to wound bed prior to debridement o Hurricaine Topical Anesthetic Spray applied to wound bed prior to  debridement Wound #6 Left Achilles o Topical Lidocaine 4% cream applied to wound bed prior to debridement o Hurricaine Topical Anesthetic Spray applied to wound bed prior to debridement Primary Wound Dressing Wound #1 Left,Lateral Calcaneous o Prisma Ag Wound #5 Right Calcaneous [place], Kamiya ([id number]) o Prisma Ag Wound #6 Left Achilles o Prisma Ag Secondary Dressing Wound #1 Left,Lateral Calcaneous o Boardered Foam Dressing Wound #5 Right Calcaneous o Boardered Foam Dressing Wound #6 Left Achilles o Boardered Foam Dressing Dressing Change Frequency Wound #1 Left,Lateral Calcaneous o Change dressing every other day. Wound #5 Right Calcaneous o Change dressing every other day. Wound #6 Left Achilles o Change dressing every other day. Follow-up Appointments Wound #1 Left,Lateral Calcaneous o Return Appointment in 1 week. Wound #5 Right Calcaneous o Return Appointment in 1 week. Wound #6 Left Achilles o Return Appointment in 1 week. Off-Loading Wound #1 Left,Lateral Calcaneous o Other: - SAGE boots while sitting or lying. Do not wear while ambulating. Wound #5 Right Calcaneous o Other: - SAGE boots while sitting or lying. Do not wear while ambulating. Wound #6 Left Achilles o Other: - SAGE boots while sitting or lying. Do not wear while ambulating. Home Health Wound #1 Left,Lateral Calcaneous SHAKEERA, WOODWARD ([id number]) o [place] Visits - [place] Nurse may visit PRN to address patientos wound care needs. o FACE TO FACE ENCOUNTER: MEDICARE and MEDICAID PATIENTS: I certify that this patient is under my care and that I had a face-to-face encounter that meets the physician face-to-face encounter requirements with this patient on this date. The encounter with the patient was in whole or in part for the following MEDICAL CONDITION: (primary reason for [place]) MEDICAL NECESSITY: I certify, that  based on my findings, NURSING services are a medically necessary home health service. HOME BOUND STATUS: I certify that my clinical findings support that this patient is homebound (i.e., Due to illness or injury, pt requires aid of supportive devices such as crutches, cane, wheelchairs, walkers, the use of special transportation or the assistance of another person to leave their place of residence. There is a normal inability to leave the home and doing so requires considerable and taxing effort. Other absences are for medical reasons / religious services and are infrequent or of short duration when for other reasons). o If current dressing causes regression in wound condition, may D/C ordered dressing product/s and apply Normal Saline Moist Dressing daily until next [place] / Other MD appointment. [place]  of regression in wound condition at [phone]. o Please direct any NON-WOUND related issues/requests for orders to patient's Primary Care Physician Wound #5 Right [place] Visits - [place] Nurse may visit PRN to address patientos wound care needs. o FACE TO FACE ENCOUNTER: MEDICARE and MEDICAID PATIENTS: I certify that this patient is under my care and that I had a face-to-face encounter that meets the physician face-to-face encounter requirements with this patient on this date. The encounter with the patient was in whole or in part for the following MEDICAL CONDITION: (primary reason for [place]) MEDICAL NECESSITY: I certify, that based on my findings, NURSING services are a medically necessary home health service. HOME BOUND STATUS: I certify that my clinical findings support that this patient is homebound (i.e., Due to illness or injury, pt requires aid of supportive devices such as crutches, cane, wheelchairs, walkers, the use of special transportation or the assistance of another person to  leave their place of residence. There is a normal inability to leave the home and doing so requires considerable and taxing effort. Other absences are for medical reasons / religious services and are infrequent or of short duration when for other reasons). o If current dressing causes regression in wound condition, may D/C ordered dressing product/s and apply Normal Saline Moist Dressing daily until next [place] / Other MD appointment. [place] of regression in wound condition at [phone]. o Please direct any NON-WOUND related issues/requests for orders to patient's Primary Care Physician Wound #6 Left Achilles o [place] Visits - [place] Nurse may visit PRN to address patientos wound care needs. o FACE TO FACE ENCOUNTER: MEDICARE and MEDICAID PATIENTS: I certify that this patient is under my care and that I had a face-to-face encounter that meets the physician face-to-face encounter requirements with this patient on this date. The encounter with the patient was in whole or in part for the following MEDICAL CONDITION: (primary reason for [place]) MEDICAL NECESSITY: I certify, that based on my findings, NURSING services are a medically necessary home health service. HOME BOUND STATUS: I certify that my clinical findings SHENE, JERDEE ([id number]) support that this patient is homebound (i.e., Due to illness or injury, pt requires aid of supportive devices such as crutches, cane, wheelchairs, walkers, the use of special transportation or the assistance of another person to leave their place of residence. There is a normal inability to leave the home and doing so requires considerable and taxing effort. Other absences are for medical reasons / religious services and are infrequent or of short duration when for other reasons). o If current dressing causes regression in wound condition, may D/C ordered dressing  product/s and apply Normal Saline Moist Dressing daily until next [place] / Other MD appointment. [place] of regression in wound condition at [phone]. o Please direct any NON-WOUND related issues/requests for orders to patient's Primary Care Physician Notes [employer] verification Electronic Signature(s) Signed: 02/01/2015 10:16:02 AM By: Regan Lemming BSN, RN Signed: 02/01/2015 1:17:45 PM By: Christin Fudge MD, FACS Previous Signature: 02/01/2015 10:11:54 AM Version By: Regan Lemming BSN, RN Previous Signature: 02/01/2015 10:08:42 AM Version By: Regan Lemming BSN, RN Entered By: Regan Lemming on 02/01/2015 10:16:02 [name] ([id number]) -------------------------------------------------------------------------------- Problem List Details Patient Name: [name] Date of Service: 02/01/2015 9:30 AM Medical Record Number: [id number] Patient Account Number: [id number] Date of Birth/Sex: [birth year] (81 y.o. Female) Treating RN: Primary Care  Physician: Constance Goltz Other Clinician: Referring Physician: Constance Goltz Treating Physician/Extender: Frann Rider in Treatment: 20 Active Problems ICD-10 Encounter Code Description Active Date Diagnosis [phone] Non-pressure chronic ulcer of left heel and midfoot with fat 09/12/2014 Yes layer exposed 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 09/12/2014 Yes of heel and midfoot I70.235 Atherosclerosis of native arteries of right leg with 09/12/2014 Yes ulceration of other part of foot L97.412 Non-pressure chronic ulcer of right heel and midfoot with 10/26/2014 Yes fat layer exposed S91.104A Unspecified open wound of right lesser toe(s) without 01/11/2015 Yes damage to nail, initial encounter Inactive Problems Resolved Problems Electronic Signature(s) Signed: 02/01/2015 1:17:45 PM By: Christin Fudge MD, FACS Entered By: Christin Fudge on 02/01/2015 10:01:50 [name]  ([id number]) -------------------------------------------------------------------------------- Progress Note Details Patient Name: [name] Date of Service: 02/01/2015 9:30 AM Medical Record Number: [id number] Patient Account Number: [id number] Date of Birth/Sex: [birth year] (81 y.o. Female) Treating RN: Primary Care Physician: Constance Goltz Other Clinician: Referring Physician: Constance Goltz Treating Physician/Extender: Frann Rider in Treatment: 20 Subjective Chief Complaint Information obtained from Patient Patient presents to the wound care center today with an open arterial ulcer on the left foot big toe and lateral heal. She also has some superficial ulcerations on the second and third toe dorsum. she has no family member with her today and she is a very poor historian and cannot give a proper history. As stated that she walks around with a walker. History of Present Illness (HPI) The following HPI elements were documented for the patient's wound: Location: left heel Quality: Patient reports experiencing a dull pain to affected area(s). Severity: Patient states wound are getting worse. Duration: Patient states that they are not certain how long the wound has been present. Timing: Pain in wound is Intermittent (comes and goes Context: The wound appeared gradually over time Associated Signs and Symptoms: Patient reports having difficulty standing for long periods. This 81 year old patient is a poor historian and comes from a nursing home. Does not have any family members with her. Says she has a ulcer on the lateral part of her left foot and some new ones are there on her right foot too. She is unable to say how long she's had these. she does walk with a walker and this is limited most of the day. Does not recall if she has had any vascular studies done. I have reviewed an x-ray done of the left foot which basically shows osteoporosis but no evidence of fracture  dislocation or osteomyelitis. 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 She seems to be doing better overall. 10/19/14 -- I understand she has gone to the vascular surgery and lab yesterday and has had all her tests done yesterday. She is doing fine otherwise and has had no fresh issues. 10/26/2014 she seems to be doing well and has no fresh problems and seems much brighter. 10/12/14 -- Taking her antibiotic and continues to do her dressings locally and she is helped by her skilled nursing. She seems to be doing fine and has no fresh issues. 10/19/14 -- she is doing fine and has had no fresh issues. She says her son to go for her vascular workup yesterday and she has been called back after 3 months. we will try and gather these reports to review what  [place], [place] ([id number]) they said. 10/26/14 --I have been able to review notes from the vascular surgery office and these were dated from 10/18/2014. He patient was on her first postop visit and had Doppler ultrasounds done of her arteries. She had more than 50% stenosis of the right superficial femoral artery and more than 50% stenosis in the left tibioperoneal trunk. She was status post a left lower extremity angiogram with angioplasty of the peroneal and left superficial femoral arteries for a nonhealing left foot and ankle ulceration. note is made of the fact that the ABIs had not changed in the last month since her previous visit. The opinion of her provider was that she did not need any intervention at this time and they had done everything they could at the present time. They would continue on Plavix and antibiotics and see her back in 3 months for a another arterial duplex study of the lower extremities. 11/02/2014 -- she has spoken to her caregivers about coming for HBO 5  times per week for 6-8 weeks and they are agreeable about this and she is willing to undergo hyperbaric oxygen therapy. 11/16/2014 she has been worked up with a EKG and chest x-ray and these were within normal limits. As far as the investigations go she is ready for HBOT. we are awaiting some insurance clearance so that she can start on her hyperbaric oxygen therapy. 11/30/2014 she tolerated hyperbaric oxygen therapy fine and there were no issues with her blood glucose levels. As noted she is not a diabetic and most of her fingerstick blood glucose levels are inaccurate because of the Raynaud's phenomena. She normally has to have your lobe sticks to get any random glucose levels. I have also requested her primary care does a venous blood glucose draw to check her glucose level. 12/21/2014 -- she is tolerating hyperbaric oxygen very well and has overall made a lot of progress. Her left leg is looking much better. 01/11/2015 - No new complaints today. New ulceration on the dorsum of the right second toe noted today on physical exam. Patient unaware. No significant pain. No fever or chills. No significant drainage. Offloading with sage boots at night. Significantly improved with HBO. 01/19/2015 - the patient has had a x-ray of the right foot done at the nursing home but we still do not have the report and we are awaiting this result. She is otherwise doing fine with her general health. 01/25/2015 -- X-ray of her right foot showed no acute fracture, dislocation, bony infection or osteomyelitis. The was some soft tissue swelling and osteopenia seen. Objective Constitutional Pulse regular. Respirations normal and unlabored. Afebrile. [place], Carli ([id number]) Vitals Time Taken: 9:44 AM, Height: 64 in, Weight: 180 lbs, BMI: 30.9, Temperature: 97.8 F, Pulse: 72 bpm, Respiratory Rate: 16 breaths/min, Blood Pressure: 140/62 mmHg. Eyes Nonicteric. Reactive to light. Ears, Nose, Mouth, and  Throat Lips, teeth, and gums WNL.Marland Kitchen Moist mucosa without lesions . Neck supple and nontender. No palpable supraclavicular or cervical adenopathy. Normal sized without goiter. Respiratory WNL. No retractions.. Cardiovascular Pedal Pulses WNL. No clubbing, cyanosis or edema. Psychiatric Judgement and insight Intact.. No evidence of depression, anxiety, or agitation.. Integumentary (Hair, Skin) once all looking pretty good and some of them have superficial debris which is easily removed with sharp dissection. The lateral calcaneal the left leg has some deeper debridement and will need sharp debridement with a curette.Marland Kitchen No crepitus or fluctuance. No peri-wound warmth or erythema. No masses.. Wound #1 status is Open. Original cause  of wound was Pressure Injury. The wound is located on the Left,Lateral Calcaneous. The wound measures 0.7cm length x 0.5cm width x 0.2cm depth; 0.275cm^2 area and 0.055cm^3 volume. The wound is limited to skin breakdown. There is no tunneling or undermining noted. There is a small amount of serous drainage noted. The wound margin is distinct with the outline attached to the wound base. There is large (67-100%) pink granulation within the wound bed. There is a small (1-33%) amount of necrotic tissue within the wound bed including Adherent Slough. The periwound skin appearance exhibited: Localized Edema, Moist. The periwound skin appearance did not exhibit: Callus, Crepitus, Excoriation, Fluctuance, Friable, Induration, Rash, Scarring, Dry/Scaly, Maceration, Atrophie Blanche, Cyanosis, Ecchymosis, Hemosiderin Staining, Mottled, Pallor, Rubor, Erythema. Periwound temperature was noted as No Abnormality. The periwound has tenderness on palpation. Wound #5 status is Open. Original cause of wound was Gradually Appeared. The wound is located on the Right Calcaneous. The wound measures 1.1cm length x 0.8cm width x 0.2cm depth; 0.691cm^2 area and 0.138cm^3 volume. The wound is  limited to skin breakdown. There is no tunneling or undermining noted. There is a small amount of serosanguineous drainage noted. The wound margin is distinct with the outline attached to the wound base. There is large (67-100%) pink granulation within the wound bed. There is a small (1-33%) amount of necrotic tissue within the wound bed including Adherent Slough. The periwound skin appearance exhibited: Localized Edema, Dry/Scaly, Moist, Hemosiderin Staining. The periwound skin appearance did not exhibit: Callus, Crepitus, Excoriation, Fluctuance, Friable, Induration, Rash, Scarring, Maceration, Atrophie Blanche, Cyanosis, Ecchymosis, Mottled, Pallor, Rubor, Erythema. Periwound temperature was noted as No Abnormality. The periwound has tenderness on palpation. [place], [place] ([id number]) Wound #6 status is Open. Original cause of wound was Gradually Appeared. The wound is located on the Left Achilles. The wound measures 1.4cm length x 1.3cm width x 0.1cm depth; 1.429cm^2 area and 0.143cm^3 volume. The wound is limited to skin breakdown. There is no tunneling or undermining noted. There is a small amount of serous drainage noted. The wound margin is distinct with the outline attached to the wound base. There is large (67-100%) pink granulation within the wound bed. There is no necrotic tissue within the wound bed. The periwound skin appearance exhibited: Localized Edema, Dry/Scaly, Hemosiderin Staining. The periwound skin appearance did not exhibit: Callus, Crepitus, Excoriation, Fluctuance, Friable, Induration, Rash, Scarring, Maceration, Moist, Atrophie Blanche, Cyanosis, Ecchymosis, Mottled, Pallor, Rubor, Erythema. Periwound temperature was noted as No Abnormality. The periwound has tenderness on palpation. 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 She is using her heel protectors at night. We will also try and see if she qualifies to get some Epifix as these wounds are getting pretty clean now and may benefit from a skin substitute. we'll see her back next week. 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 There was a Skin/Subcutaneous Tissue Debridement [id number] debridement with total area of 0.35 sq cm performed by Rivkah Wolz, Jackson Latino., MD. with the following  instrument(s): Curette to remove Viable and Non-Viable tissue/material including Exudate, Fibrin/Slough, and Subcutaneous after achieving pain control using Lidocaine 4% Topical Solution. A time out was conducted prior to the start of the procedure. A Minimum amount of bleeding was controlled with Pressure. The procedure was tolerated well with a pain level of 0 throughout and a pain level of 0 following the procedure. Post Debridement Measurements: 0.7cm length x 0.5cm width x 0.2cm depth; 0.055cm^3 volume. [place], [place] ([id number]) Wound #5 Wound #5 is an Arterial Insufficiency Ulcer located on the Right Calcaneous . There was a Non-Viable Tissue Open Wound/Selective [phone]) debridement with total area of 0.88 sq cm performed by [name]., MD. with the following instrument(s): Curette to remove Non-Viable tissue/material including Exudate and Fibrin/Slough after achieving pain control using Lidocaine 4% Topical Solution. A time out was conducted prior to the start of the procedure. A Minimum amount of bleeding was controlled with  Pressure. The procedure was tolerated well with a pain level of 0 throughout and a pain level of 0 following the procedure. Post Debridement Measurements: 1.1cm length x 0.8cm width x 0.2cm depth; 0.138cm^3 volume. Wound #6 Wound #6 is an Arterial Insufficiency Ulcer located on the Left Achilles . There was a Non-Viable Tissue Open Wound/Selective [phone]) debridement with total area of 1.82 sq cm performed by [name]., MD. with the following instrument(s): Curette to remove Non-Viable tissue/material including Exudate, Fibrin/Slough, and Eschar after achieving pain control using Lidocaine 4% Topical Solution. A time out was conducted prior to the start of the procedure. A Minimum amount of bleeding was controlled with Pressure. The procedure was tolerated well with a pain level of 0 throughout and a pain level of 0 following the procedure. Post Debridement Measurements: 1.4cm length x 1.3cm width x 0.1cm depth; 0.143cm^3 volume. Plan Wound Cleansing: Wound #1 Left,Lateral Calcaneous: Clean wound with Normal Saline. - be sure to clean out all remaining prisma Wound #5 Right Calcaneous: Clean wound with Normal Saline. - be sure to clean out all remaining prisma Wound #6 Left Achilles: Clean wound with Normal Saline. - be sure to clean out all remaining prisma Anesthetic: Wound #1 Left,Lateral Calcaneous: Topical Lidocaine 4% cream applied to wound bed prior to debridement Hurricaine Topical Anesthetic Spray applied to wound bed prior to debridement Wound #5 Right Calcaneous: Topical Lidocaine 4% cream applied to wound bed prior to debridement Hurricaine Topical Anesthetic Spray applied to wound bed prior to debridement Wound #6 Left Achilles: Topical Lidocaine 4% cream applied to wound bed prior to debridement Hurricaine Topical Anesthetic Spray applied to wound bed prior to debridement Primary Wound Dressing: Wound #1 Left,Lateral Calcaneous: Prisma Ag Wound #5 Right  Calcaneous: Prisma Ag Wound #6 Left Achilles: Roque Cash, Tameck[place] ([id number]) Secondary Dressing: Wound #1 Left,Lateral Calcaneous: Boardered Foam Dressing Wound #5 Right Calcaneous: Boardered Foam Dressing Wound #6 Left Achilles: Boardered Foam Dressing Dressing Change Frequency: Wound #1 Left,Lateral Calcaneous: Change dressing every other day. Wound #5 Right Calcaneous: Change dressing every other day. Wound #6 Left Achilles: Change dressing every other day. Follow-up Appointments: Wound #1 Left,Lateral Calcaneous: Return Appointment in 1 week. Wound #5 Right Calcaneous: Return Appointment in 1 week. Wound #6 Left Achilles: Return Appointment in 1 week. Off-Loading: Wound #1 Left,Lateral Calcaneous: Other: - SAGE boots while sitting or lying. Do not wear while ambulating. Wound #5 Right Calcaneous: Other: - SAGE boots while sitting or lying. Do not wear while ambulating. Wound #6 Left Achilles: Other: - SAGE boots while sitting or lying.  Do not wear while ambulating. Home Health: Wound #1 Left,Lateral Calcaneous: Continue Home Health Visits - [facility] Nurse may visit PRN to address patient s wound care needs. FACE TO FACE ENCOUNTER: MEDICARE and MEDICAID PATIENTS: I certify that this patient is under my care and that I had a face-to-face encounter that meets the physician face-to-face encounter requirements with this patient on this date. The encounter with the patient was in whole or in part for the following MEDICAL CONDITION: (primary reason for [place]) MEDICAL NECESSITY: I certify, that based on my findings, NURSING services are a medically necessary home health service. HOME BOUND STATUS: I certify that my clinical findings support that this patient is homebound (i.e., Due to illness or injury, pt requires aid of supportive devices such as crutches, cane, wheelchairs, walkers, the use of special transportation or the assistance of another  person to leave their place of residence. There is a normal inability to leave the home and doing so requires considerable and taxing effort. Other absences are for medical reasons / religious services and are infrequent or of short duration when for other reasons). If current dressing causes regression in wound condition, may D/C ordered dressing product/s and apply Normal Saline Moist Dressing daily until next [place] / Other MD appointment. [place] of regression in wound condition at [phone]. Please direct any NON-WOUND related issues/requests for orders to patient's Primary Care Physician Wound #5 Right Calcaneous: [place] Visits - [facility] Nurse may visit PRN to address patient s wound care needs. FACE TO FACE ENCOUNTER: MEDICARE and MEDICAID PATIENTS: I certify that this patient is under my care and that I had a face-to-face encounter that meets the physician face-to-face encounter [place], [place] ([id number]) requirements with this patient on this date. The encounter with the patient was in whole or in part for the following MEDICAL CONDITION: (primary reason for [place]) MEDICAL NECESSITY: I certify, that based on my findings, NURSING services are a medically necessary home health service. HOME BOUND STATUS: I certify that my clinical findings support that this patient is homebound (i.e., Due to illness or injury, pt requires aid of supportive devices such as crutches, cane, wheelchairs, walkers, the use of special transportation or the assistance of another person to leave their place of residence. There is a normal inability to leave the home and doing so requires considerable and taxing effort. Other absences are for medical reasons / religious services and are infrequent or of short duration when for other reasons). If current dressing causes regression in wound condition, may D/C ordered dressing product/s and  apply Normal Saline Moist Dressing daily until next [place] / Other MD appointment. [place] of regression in wound condition at [phone]. Please direct any NON-WOUND related issues/requests for orders to patient's Primary Care Physician Wound #6 Left Achilles: [place] Visits - [facility] Nurse may visit PRN to address patient s wound care needs. FACE TO FACE ENCOUNTER: MEDICARE and MEDICAID PATIENTS: I certify that this patient is under my care and that I had a face-to-face encounter that meets the physician face-to-face encounter requirements with this patient on this date. The encounter with the patient was in whole or in part for the following MEDICAL CONDITION: (primary reason for [place]) MEDICAL NECESSITY: I certify, that based on my findings, NURSING services are a medically necessary home health service. HOME BOUND STATUS: I certify that my clinical findings support that  this patient is homebound (i.e., Due to illness or injury, pt requires aid of supportive devices such as crutches, cane, wheelchairs, walkers, the use of special transportation or the assistance of another person to leave their place of residence. There is a normal inability to leave the home and doing so requires considerable and taxing effort. Other absences are for medical reasons / religious services and are infrequent or of short duration when for other reasons). If current dressing causes regression in wound condition, may D/C ordered dressing product/s and apply Normal Saline Moist Dressing daily until next [place] / Other MD appointment. [place] of regression in wound condition at [phone]. Please direct any NON-WOUND related issues/requests for orders to patient's Primary Care Physician General Notes: Epifix verification We will continue with Prisma AG and a local bordered foam. She is using her heel  protectors at night. We will also try and see if she qualifies to get some Epifix as these wounds are getting pretty clean now and may benefit from a skin substitute. we'll see her back next week. Electronic Signature(s) Signed: 02/01/2015 4:11:14 PM By: Christin Fudge MD, FACS Previous Signature: 02/01/2015 1:17:45 PM Version By: Christin Fudge MD, FACS Entered By: Christin Fudge on 02/01/2015 [place], [place] ([id number]) -------------------------------------------------------------------------------- SuperBill Details Patient Name: [name] Date of Service: 02/01/2015 Medical Record Number: [id number] Patient Account Number: [id number] Date of Birth/Sex: [birth year] (81 y.o. Female) Treating RN: Primary Care Physician: Constance Goltz Other Clinician: Referring Physician: Constance Goltz Treating Physician/Extender: Frann Rider in Treatment: 20 Diagnosis Coding ICD-10 Codes Code Description [phone] Non-pressure chronic ulcer of left heel and midfoot with fat layer exposed 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 of heel and midfoot I70.235 Atherosclerosis of native arteries of right leg with ulceration of other part of foot 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 S91.104A Unspecified open wound of right lesser toe(s) without damage to nail, initial encounter Facility Procedures CPT4: Description Modifier Quantity Code [id number] - DEB SUBQ TISSUE 20 SQ CM/< 1 ICD-10 Description Diagnosis L97.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 L97.412 Non-pressure chronic ulcer of right heel and  midfoot with fat layer exposed CPT4: [id number] - DEBRIDE WOUND 1ST 20 SQ CM OR < 59 1 ICD-10 Description Diagnosis L97.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 L97.412 Non-pressure chronic ulcer of right heel and midfoot with fat layer   exposed 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 of heel and midfoot Physician Procedures CPT4: Description Modifier Quantity Code [id number] - WC PHYS SUBQ TISS 20 SQ CM 1 ICD-10 Description Diagnosis L97.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

## 2015-02-01 NOTE — Progress Notes (Signed)
ANACLARA, BUFKIN ([id number]) Visit Report for 02/01/2015 Arrival Information Details Patient Name: STEPHANIA, [name] Date of Service: 02/01/2015 9:30 AM Medical Record Number: [id number] Patient Account Number: [id number] Date of Birth/Sex: March 14, 1928 (81 y.o. Female) Treating RN: Afful, RN, BSN, Velva Harman Primary Care Physician: Constance Goltz Other Clinician: Referring Physician: Constance Goltz Treating Physician/Extender: Frann Rider in Treatment: 20 Visit Information History Since Last Visit Any new allergies or adverse reactions: No Patient Arrived: Wheel Chair Had a fall or experienced change in No Arrival Time: 09:40 activities of daily living that may affect Accompanied By: self risk of falls: Transfer Assistance: None Signs or symptoms of abuse/neglect since last No Patient Identification Verified: Yes visito Secondary Verification Process Yes Hospitalized since last visit: No Completed: Has Dressing in Place as Prescribed: Yes Patient Requires Transmission- No Pain Present Now: No Based Precautions: Patient Has Alerts: Yes Patient Alerts: Patient on Blood Thinner DMII Plavix ABI L 0.63 R 0.79 Electronic Signature(s) Signed: 02/01/2015 4:04:56 PM By: Regan Lemming BSN, RN Entered By: Regan Lemming on 02/01/2015 09:45:16 [name] ([id number]) -------------------------------------------------------------------------------- Encounter Discharge Information Details Patient Name: [name] Date of Service: 02/01/2015 9:30 AM Medical Record Number: [id number] Patient Account Number: [id number] Date of Birth/Sex: [birth year] (81 y.o. Female) Treating RN: Baruch Gouty, RN, BSN, Velva Harman Primary Care Physician: Constance Goltz Other Clinician: Referring Physician: Constance Goltz Treating Physician/Extender: Frann Rider in Treatment: 20 Encounter Discharge Information Items Discharge Pain Level: 0 Discharge Condition: Stable Ambulatory Status: Wheelchair Discharge  Destination: Nursing Home Transportation: Other Schedule Follow-up Appointment: No Medication Reconciliation completed and provided to Patient/Care No Jamae Tison: Provided on Clinical Summary of Care: 02/01/2015 Form Type Recipient Paper Patient DJ Electronic Signature(s) Signed: 02/01/2015 4:04:56 PM By: Regan Lemming BSN, RN Previous Signature: 02/01/2015 10:15:11 AM Version By: Ruthine Dose Entered By: Regan Lemming on 02/01/2015 10:17:12 [name] ([id number]) -------------------------------------------------------------------------------- Lower Extremity Assessment Details Patient Name: [name] Date of Service: 02/01/2015 9:30 AM Medical Record Number: [id number] Patient Account Number: [id number] Date of Birth/Sex: [birth year] (81 y.o. Female) Treating RN: Afful, RN, BSN, Velva Harman Primary Care Physician: Constance Goltz Other Clinician: Referring Physician: Constance Goltz Treating Physician/Extender: Frann Rider in Treatment: 20 Vascular Assessment Pulses: Posterior Tibial Dorsalis Pedis Palpable: [Left:Yes] [Right:Yes] Extremity colors, hair growth, and conditions: Extremity Color: [Left:Normal] [Right:Normal] Hair Growth on Extremity: [Left:No] [Right:No] Temperature of Extremity: [Left:Warm] [Right:Warm] Capillary Refill: [Left:< 3 seconds] [Right:< 3 seconds] Dependent Rubor: [Left:No] [Right:No] Blanched when Elevated: [Left:No] [Right:No] Lipodermatosclerosis: [Left:No] [Right:No] Toe Nail Assessment Left: Right: Thick: Yes Discolored: Yes Yes Deformed: No No Improper Length and Hygiene: No Electronic Signature(s) Signed: 02/01/2015 4:04:56 PM By: Regan Lemming BSN, RN Entered By: Regan Lemming on 02/01/2015 09:47:05 [name] ([id number]) -------------------------------------------------------------------------------- Multi Wound Chart Details Patient Name: [name] Date of Service: 02/01/2015 9:30 AM Medical Record Number: [id number] Patient  Account Number: [id number] Date of Birth/Sex: Jun 10, 1928 (81 y.o. Female) Treating RN: Baruch Gouty, RN, BSN, Velva Harman Primary Care Physician: Constance Goltz Other Clinician: Referring Physician: Constance Goltz Treating Physician/Extender: Frann Rider in Treatment: 20 Vital Signs Height(in): 64 Pulse(bpm): 72 Weight(lbs): 180 Blood Pressure 140/62 (mmHg): Body Mass Index(BMI): 31 Temperature(F): 97.8 Respiratory Rate 16 (breaths/min): Photos: [1:No Photos] [5:No Photos] [6:No Photos] Wound Location: [1:Left Calcaneous - Lateral Right Calcaneous] [6:Left Achilles] Wounding Event: [1:Pressure Injury] [5:Gradually Appeared] [6:Gradually Appeared] Primary Etiology: [1:Arterial Insufficiency Ulcer Arterial Insufficiency Ulcer Arterial Insufficiency Ulcer] Secondary Etiology: [1:Pressure Ulcer] [5:N/A] [6:N/A] Comorbid History: [1:Glaucoma, Asthma, Hypertension, Peripheral Hypertension, Peripheral Hypertension, Peripheral Venous Disease, Type II Venous Disease, Type II Venous Disease, Type  II Diabetes] [5:Glaucoma, Asthma, Diabetes] [6:Glaucoma, Asthma,  Diabetes] Date Acquired: [1:07/24/2014] [5:09/26/2014] [6:09/26/2014] Weeks of Treatment: [1:20] [5:17] [6:17] Wound Status: [1:Open] [5:Open] [6:Open] Measurements L x W x D 0.7x0.5x0.2 [5:1.1x0.8x0.2] [6:1.4x1.3x0.1] (cm) Area (cm) : [1:0.275] [5:0.691] [6:1.429] Volume (cm) : [1:0.055] [5:0.138] [6:0.143] % Reduction in Area: [1:94.60%] [5:87.40%] [6:62.10%] % Reduction in Volume: 97.30% [5:74.90%] [6:92.40%] Classification: [1:Partial Thickness] [5:Full Thickness Without Exposed Support Structures] [6:Partial Thickness] HBO Classification: [1:Grade 1] [5:Grade 1] [6:Grade 1] Exudate Amount: [1:Small] [5:Small] [6:Small] Exudate Type: [1:Serous] [5:Serosanguineous] [6:Serous] Exudate Color: [1:amber] [5:red, brown] [6:amber] Wound Margin: [1:Distinct, outline attached Distinct, outline attached Distinct, outline attached] Granulation  Amount: [1:Large (67-100%)] [5:Large (67-100%)] [6:Large (67-100%)] Granulation Quality: [1:Pink] [5:Pink] [6:Pink] Necrotic Amount: [1:Small (1-33%)] [5:Small (1-33%)] [6:None Present (0%)] Exposed Structures: [name], [name] ([id number]) Fascia: No Fascia: No Fascia: No Fat: No Fat: No Fat: No Tendon: No Tendon: No Tendon: No Muscle: No Muscle: No Muscle: No Joint: No Joint: No Joint: No Bone: No Bone: No Bone: No Limited to Skin Limited to Skin Limited to Skin Breakdown Breakdown Breakdown Epithelialization: Medium (34-66%) Medium (34-66%) Medium (34-66%) Debridement: Debridement [id number] Wound/Selective Open Wound/Selective [id number]) [id number] - Selective [id number]) - Selective Time-Out Taken: Yes Yes Yes Pain Control: Lidocaine 4% Topical Lidocaine 4% Topical Lidocaine 4% Topical Solution Solution Solution Tissue Debrided: Fibrin/Slough, Exudates, Fibrin/Slough, Exudates Necrotic/Eschar, Subcutaneous Fibrin/Slough, Exudates Level: Skin/Subcutaneous Non-Viable Tissue Non-Viable Tissue Tissue Debridement Area (sq 0.35 0.88 1.82 cm): Instrument: Curette Curette Curette Bleeding: Minimum Minimum Minimum Hemostasis Achieved: Pressure Pressure Pressure Procedural Pain: 0 0 0 Post Procedural Pain: 0 0 0 Debridement Treatment Procedure was tolerated Procedure was tolerated Procedure was tolerated Response: well well well Post Debridement 0.7x0.5x0.2 1.1x0.8x0.2 1.4x1.3x0.1 Measurements L x W x D (cm) Post Debridement 0.055 0.138 0.143 Volume: (cm) Periwound Skin Texture: Edema: Yes Edema: Yes Edema: Yes Excoriation: No Excoriation: No Excoriation: No Induration: No Induration: No Induration: No Callus: No Callus: No Callus: No Crepitus: No Crepitus: No Crepitus: No Fluctuance: No Fluctuance: No Fluctuance: No Friable: No Friable: No Friable: No Rash: No Rash: No Rash: No Scarring: No Scarring: No Scarring: No Periwound Skin Moist:  Yes Moist: Yes Dry/Scaly: Yes Moisture: Maceration: No Dry/Scaly: Yes Maceration: No Dry/Scaly: No Maceration: No Moist: No Periwound Skin Color: Atrophie Blanche: No Hemosiderin Staining: Yes Hemosiderin Staining: Yes Cyanosis: No Atrophie Blanche: No Atrophie Blanche: No Ecchymosis: No Cyanosis: No Cyanosis: No Erythema: No Ecchymosis: No Ecchymosis: No Hemosiderin Staining: No Erythema: No Erythema: No Mottled: No Mottled: No Mottled: No [name], [name] ([id number]) Pallor: No Pallor: No Pallor: No Rubor: No Rubor: No Rubor: No Temperature: No Abnormality No Abnormality No Abnormality Tenderness on Yes Yes Yes Palpation: Wound Preparation: Ulcer Cleansing: Ulcer Cleansing: Ulcer Cleansing: Rinsed/Irrigated with Rinsed/Irrigated with Rinsed/Irrigated with Saline Saline Saline Topical Anesthetic Topical Anesthetic Topical Anesthetic Applied: Other: Lidocaine Applied: Other: Lidocaine Applied: Other: lidocaine 4% Cream 4% Ointment 4% Procedures Performed: Debridement Debridement Debridement Treatment Notes Electronic Signature(s) Signed: 02/01/2015 4:04:56 PM By: Regan Lemming BSN, RN Entered By: Regan Lemming on 02/01/2015 10:07:42 [name] ([id number]) -------------------------------------------------------------------------------- [place] Details Patient Name: [name] Date of Service: 02/01/2015 9:30 AM Medical Record Number: [id number] Patient Account Number: [id number] Date of Birth/Sex: 1927/10/06 (81 y.o. Female) Treating RN: Baruch Gouty, RN, BSN, Velva Harman Primary Care Physician: Constance Goltz Other Clinician: Referring Physician: Constance Goltz Treating Physician/Extender: Frann Rider in Treatment: 20 Active Inactive Abuse / Safety / Falls / Self Care Management Nursing Diagnoses: Impaired physical mobility Potential for falls Goals: Patient will remain  injury free Date Initiated: 09/12/2014 Goal Status:  Active Patient/caregiver will verbalize understanding of skin care regimen Date Initiated: 09/12/2014 Goal Status: Active Patient/caregiver will verbalize/demonstrate measures taken to prevent injury and/or falls Date Initiated: 09/12/2014 Goal Status: Active Patient/caregiver will verbalize/demonstrate understanding of what to do in case of emergency Date Initiated: 09/12/2014 Goal Status: Active Interventions: Assess fall risk on admission and as needed Assess: immobility, friction, shearing, incontinence upon admission and as needed Assess impairment of mobility on admission and as needed per policy Provide education on basic hygiene Provide education on fall prevention Provide education on personal and home safety Provide education on safe transfers Treatment Activities: Education provided on Basic Hygiene : 10/12/2014 Notes: Necrotic Tissue ATZIRY, MARKEY ([id number]) Nursing Diagnoses: Impaired tissue integrity related to necrotic/devitalized tissue Knowledge deficit related to management of necrotic/devitalized tissue Goals: Necrotic/devitalized tissue will be minimized in the wound bed Date Initiated: 09/12/2014 Goal Status: Active Patient/caregiver will verbalize understanding of reason and process for debridement of necrotic tissue Date Initiated: 09/12/2014 Goal Status: Active Interventions: Assess patient pain level pre-, during and post procedure and prior to discharge Provide education on necrotic tissue and debridement process Treatment Activities: Apply topical anesthetic as ordered : 02/01/2015 Enzymatic debridement : 02/01/2015 Excisional debridement : 02/01/2015 Notes: Orientation to the Wound Care Program Nursing Diagnoses: Knowledge deficit related to the wound healing center program Goals: Patient/caregiver will verbalize understanding of the [facility] Date Initiated: 09/12/2014 Goal Status: Active Interventions: Provide education on  orientation to the wound center Notes: Pressure Nursing Diagnoses: Knowledge deficit related to causes and risk factors for pressure ulcer development Knowledge deficit related to management of pressures ulcers Potential for impaired tissue integrity related to pressure, friction, moisture, and shear GoalsGIUSEPPA, [name] ([id number]) Patient will remain free from development of additional pressure ulcers Date Initiated: 09/12/2014 Goal Status: Active Patient will remain free of pressure ulcers Date Initiated: 09/12/2014 Goal Status: Active Patient/caregiver will verbalize risk factors for pressure ulcer development Date Initiated: 09/12/2014 Goal Status: Active Patient/caregiver will verbalize understanding of pressure ulcer management Date Initiated: 09/12/2014 Goal Status: Active Interventions: Assess: immobility, friction, shearing, incontinence upon admission and as needed Assess offloading mechanisms upon admission and as needed Assess potential for pressure ulcer upon admission and as needed Provide education on pressure ulcers Treatment Activities: Pressure reduction/relief device ordered : 02/01/2015 Notes: Wound/Skin Impairment Nursing Diagnoses: Impaired tissue integrity Knowledge deficit related to ulceration/compromised skin integrity Goals: Patient/caregiver will verbalize understanding of skin care regimen Date Initiated: 09/12/2014 Goal Status: Active Ulcer/skin breakdown will heal within 14 weeks Date Initiated: 09/12/2014 Goal Status: Active Interventions: Assess patient/caregiver ability to obtain necessary supplies Assess patient/caregiver ability to perform ulcer/skin care regimen upon admission and as needed Assess ulceration(s) every visit Provide education on ulcer and skin care Treatment Activities: Skin care regimen initiated : 02/01/2015 [name], [name] ([id number]) Topical wound management initiated : 02/01/2015 Notes: Electronic  Signature(s) Signed: 02/01/2015 4:04:56 PM By: Regan Lemming BSN, RN Entered By: Regan Lemming on 02/01/2015 09:51:33 [name] ([id number]) -------------------------------------------------------------------------------- Pain Assessment Details Patient Name: [name] Date of Service: 02/01/2015 9:30 AM Medical Record Number: [id number] Patient Account Number: [id number] Date of Birth/Sex: 1927-09-30 (81 y.o. Female) Treating RN: Baruch Gouty, RN, BSN, Velva Harman Primary Care Physician: Constance Goltz Other Clinician: Referring Physician: Constance Goltz Treating Physician/Extender: Frann Rider in Treatment: 20 Active Problems Location of Pain Severity and Description of Pain Patient Has Paino No Site Locations Pain Management and Medication Current Pain Management: Electronic Signature(s) Signed: 02/01/2015 4:04:56 PM By: Baruch Gouty,  Velva Harman BSN, RN Entered By: Regan Lemming on 02/01/2015 09:45:22 [name] ([id number]) -------------------------------------------------------------------------------- Patient/Caregiver Education Details Patient Name: [name] Date of Service: 02/01/2015 9:30 AM Medical Record Number: [id number] Patient Account Number: [id number] Date of Birth/Gender: 1928/05/08 (81 y.o. Female) Treating RN: Baruch Gouty, RN, BSN, Velva Harman Primary Care Physician: Constance Goltz Other Clinician: Referring Physician: Constance Goltz Treating Physician/Extender: Frann Rider in Treatment: 20 Education Assessment Education Provided To: Patient and Caregiver HH orders Education Topics Provided Basic Hygiene: Methods: Explain/Verbal Responses: State content correctly Wound/Skin Impairment: Methods: Explain/Verbal Responses: State content correctly Electronic Signature(s) Signed: 02/01/2015 4:04:56 PM By: Regan Lemming BSN, RN Entered By: Regan Lemming on 02/01/2015 10:17:30 [name]  ([id number]) -------------------------------------------------------------------------------- Wound Assessment Details Patient Name: [name] Date of Service: 02/01/2015 9:30 AM Medical Record Number: [id number] Patient Account Number: [id number] Date of Birth/Sex: 11-10-1927 (81 y.o. Female) Treating RN: Afful, RN, BSN, [place] Primary Care Physician: Constance Goltz Other Clinician: Referring Physician: Constance Goltz Treating Physician/Extender: Frann Rider in Treatment: 20 Wound Status Wound Number: 1 Primary Arterial Insufficiency Ulcer Etiology: Wound Location: Left Calcaneous - Lateral Secondary Pressure Ulcer Wounding Event: Pressure Injury Etiology: Date Acquired: 07/24/2014 Wound Open Weeks Of Treatment: 20 Status: Clustered Wound: No Comorbid Glaucoma, Asthma, Hypertension, History: Peripheral Venous Disease, Type II Diabetes Photos Photo Uploaded By: Regan Lemming on 02/01/2015 16:02:41 Wound Measurements Length: (cm) 0.7 Width: (cm) 0.5 Depth: (cm) 0.2 Area: (cm) 0.275 Volume: (cm) 0.055 % Reduction in Area: 94.6% % Reduction in Volume: 97.3% Epithelialization: Medium (34-66%) Tunneling: No Undermining: No Wound Description Classification: Partial Thickness Foul O Diabetic Severity (Wagner): Grade 1 Wound Margin: Distinct, outline attached Exudate Amount: Small Exudate Type: Serous Exudate Color: amber dor After Cleansing: No Wound Bed Granulation Amount: Large (67-100%) Exposed Structure [name], [name] ([id number]) Granulation Quality: Pink Fascia Exposed: No Necrotic Amount: Small (1-33%) Fat Layer Exposed: No Necrotic Quality: Adherent Slough Tendon Exposed: No Muscle Exposed: No Joint Exposed: No Bone Exposed: No Limited to Skin Breakdown Periwound Skin Texture Texture Color No Abnormalities Noted: No No Abnormalities Noted: No Callus: No Atrophie Blanche: No Crepitus: No Cyanosis: No Excoriation: No Ecchymosis:  No Fluctuance: No Erythema: No Friable: No Hemosiderin Staining: No Induration: No Mottled: No Localized Edema: Yes Pallor: No Rash: No Rubor: No Scarring: No Temperature / Pain Moisture Temperature: No Abnormality No Abnormalities Noted: No Tenderness on Palpation: Yes Dry / Scaly: No Maceration: No Moist: Yes Wound Preparation Ulcer Cleansing: Rinsed/Irrigated with Saline Topical Anesthetic Applied: Other: Lidocaine 4% Cream, Treatment Notes Wound #1 (Left, Lateral Calcaneous) 1. Cleansed with: Clean wound with Normal Saline 3. Peri-wound Care: Skin Prep 4. Dressing Applied: Prisma Ag 5. Secondary Dressing Applied Bordered Foam Dressing Electronic Signature(s) Signed: 02/01/2015 4:04:56 PM By: Regan Lemming BSN, RN Entered By: Regan Lemming on 02/01/2015 09:51:03 [name] ([id number]) -------------------------------------------------------------------------------- Wound Assessment Details Patient Name: [name] Date of Service: 02/01/2015 9:30 AM Medical Record Number: [id number] Patient Account Number: [id number] Date of Birth/Sex: 1927-12-28 (81 y.o. Female) Treating RN: Afful, RN, BSN, Velva Harman Primary Care Physician: Constance Goltz Other Clinician: Referring Physician: Constance Goltz Treating Physician/Extender: Frann Rider in Treatment: 20 Wound Status Wound Number: 5 Primary Arterial Insufficiency Ulcer Etiology: Wound Location: Right Calcaneous Wound Open Wounding Event: Gradually Appeared Status: Date Acquired: 09/26/2014 Comorbid Glaucoma, Asthma, Hypertension, Weeks Of Treatment: 17 History: Peripheral Venous Disease, Type II Clustered Wound: No Diabetes Photos Photo Uploaded By: Regan Lemming on 02/01/2015 16:02:42 Wound Measurements Length: (cm) 1.1 Width: (cm) 0.8 Depth: (cm) 0.2 Area: (cm) 0.691 Volume: (cm)  0.138 % Reduction in Area: 87.4% % Reduction in Volume: 74.9% Epithelialization: Medium (34-66%) Tunneling:  No Undermining: No Wound Description Full Thickness Without Exposed Foul Odor A Classification: Support Structures Diabetic Severity Grade 1 (Wagner): Wound Margin: Distinct, outline attached Exudate Amount: Small Exudate Type: Serosanguineous Exudate Color: red, brown fter Cleansing: No Wound Bed Granulation Amount: Large (67-100%) Exposed Structure [place], [name] ([id number]) Granulation Quality: Pink Fascia Exposed: No Necrotic Amount: Small (1-33%) Fat Layer Exposed: No Necrotic Quality: Adherent Slough Tendon Exposed: No Muscle Exposed: No Joint Exposed: No Bone Exposed: No Limited to Skin Breakdown Periwound Skin Texture Texture Color No Abnormalities Noted: No No Abnormalities Noted: No Callus: No Atrophie Blanche: No Crepitus: No Cyanosis: No Excoriation: No Ecchymosis: No Fluctuance: No Erythema: No Friable: No Hemosiderin Staining: Yes Induration: No Mottled: No Localized Edema: Yes Pallor: No Rash: No Rubor: No Scarring: No Temperature / Pain Moisture Temperature: No Abnormality No Abnormalities Noted: No Tenderness on Palpation: Yes Dry / Scaly: Yes Maceration: No Moist: Yes Wound Preparation Ulcer Cleansing: Rinsed/Irrigated with Saline Topical Anesthetic Applied: Other: Lidocaine 4% Ointment, Treatment Notes Wound #5 (Right Calcaneous) 1. Cleansed with: Clean wound with Normal Saline 3. Peri-wound Care: Skin Prep 4. Dressing Applied: Prisma Ag 5. Secondary Dressing Applied Bordered Foam Dressing Electronic Signature(s) Signed: 02/01/2015 4:04:56 PM By: Regan Lemming BSN, RN Entered By: Regan Lemming on 02/01/2015 09:51:13 [name] ([id number]) -------------------------------------------------------------------------------- Wound Assessment Details Patient Name: [name] Date of Service: 02/01/2015 9:30 AM Medical Record Number: [id number] Patient Account Number: [id number] Date of Birth/Sex: 1927-10-30 (81 y.o.  Female) Treating RN: Afful, RN, BSN, Velva Harman Primary Care Physician: Constance Goltz Other Clinician: Referring Physician: Constance Goltz Treating Physician/Extender: Frann Rider in Treatment: 20 Wound Status Wound Number: 6 Primary Arterial Insufficiency Ulcer Etiology: Wound Location: Left Achilles Wound Open Wounding Event: Gradually Appeared Status: Date Acquired: 09/26/2014 Comorbid Glaucoma, Asthma, Hypertension, Weeks Of Treatment: 17 History: Peripheral Venous Disease, Type II Clustered Wound: No Diabetes Photos Photo Uploaded By: Regan Lemming on 02/01/2015 16:02:42 Wound Measurements Length: (cm) 1.4 Width: (cm) 1.3 Depth: (cm) 0.1 Area: (cm) 1.429 Volume: (cm) 0.143 % Reduction in Area: 62.1% % Reduction in Volume: 92.4% Epithelialization: Medium (34-66%) Tunneling: No Undermining: No Wound Description Classification: Partial Thickness Foul O Diabetic Severity (Wagner): Grade 1 Wound Margin: Distinct, outline attached Exudate Amount: Small Exudate Type: Serous Exudate Color: amber dor After Cleansing: No Wound Bed Granulation Amount: Large (67-100%) Exposed Structure Granulation Quality: Pink Fascia Exposed: No Necrotic Amount: None Present (0%) Fat Layer Exposed: No [name], [name] ([id number]) Tendon Exposed: No Muscle Exposed: No Joint Exposed: No Bone Exposed: No Limited to Skin Breakdown Periwound Skin Texture Texture Color No Abnormalities Noted: No No Abnormalities Noted: No Callus: No Atrophie Blanche: No Crepitus: No Cyanosis: No Excoriation: No Ecchymosis: No Fluctuance: No Erythema: No Friable: No Hemosiderin Staining: Yes Induration: No Mottled: No Localized Edema: Yes Pallor: No Rash: No Rubor: No Scarring: No Temperature / Pain Moisture Temperature: No Abnormality No Abnormalities Noted: No Tenderness on Palpation: Yes Dry / Scaly: Yes Maceration: No Moist: No Wound Preparation Ulcer Cleansing: Rinsed/Irrigated  with Saline Topical Anesthetic Applied: Other: lidocaine 4%, Treatment Notes Wound #6 (Left Achilles) 1. Cleansed with: Clean wound with Normal Saline 3. Peri-wound Care: Skin Prep 4. Dressing Applied: Prisma Ag 5. Secondary Dressing Applied Bordered Foam Dressing Electronic Signature(s) Signed: 02/01/2015 4:04:56 PM By: Regan Lemming BSN, RN Entered By: Regan Lemming on 02/01/2015 09:51:24 [name] ([id number]) -------------------------------------------------------------------------------- [place] Details Patient Name: [name] Date of Service: 02/01/2015  9:30 AM Medical Record Number: [id number] Patient Account Number: [id number] Date of Birth/Sex: 1928-03-11 (81 y.o. Female) Treating RN: Afful, RN, BSN, Velva Harman Primary Care Physician: Constance Goltz Other Clinician: Referring Physician: Constance Goltz Treating Physician/Extender: Frann Rider in Treatment: 20 Vital Signs Time Taken: 09:44 Temperature (F): 97.8 Height (in): 64 Pulse (bpm): 72 Weight (lbs): 180 Respiratory Rate (breaths/min): 16 Body Mass Index (BMI): 30.9 Blood Pressure (mmHg): 140/62 Reference Range: 80 - 120 mg / dl Electronic Signature(s) Signed: 02/01/2015 4:04:56 PM By: Regan Lemming BSN, RN Entered By: Regan Lemming on 02/01/2015 09:45:42

## 2015-02-08 ENCOUNTER — Encounter: Payer: Medicare Other | Admitting: Surgery

## 2015-02-08 DIAGNOSIS — L97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: Secondary | ICD-10-CM | POA: Diagnosis not present

## 2015-02-09 NOTE — Progress Notes (Signed)
[name], SPLAIN ([id number]) Visit Report for 02/08/2015 Chief Complaint Document Details Patient Name: [name], [name] Date of Service: 02/08/2015 8:15 AM Medical Record Number: [id number] Patient Account Number: [id number] Date of Birth/Sex: 1927-08-22 (81 y.o. 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 She also has some superficial ulcerations on the second and third toe dorsum. she has no family member with her today and she is a very poor historian and cannot give a proper history. As stated that she walks around with a walker. Electronic Signature(s) Signed: 02/08/2015 12:19:52 PM By: [name] Fudge MD, FACS Entered By: [name] on 02/08/2015 08:59:15 [name] ([id number]) -------------------------------------------------------------------------------- Debridement Details Patient Name: [name] Date of Service: 02/08/2015 8:15 AM Medical Record Number: [id number] Patient Account Number: [id number] Date of Birth/Sex: Dec 05, 1927 (81 y.o. Female) Treating RN: Primary Care Physician: [name] Other Clinician: Referring Physician: Constance [name] Treating Physician/Extender: Frann Rider in Treatment: 21 Debridement Performed for Wound #1 Left,Lateral Calcaneous Assessment: Performed By: Physician Pat Patrick., MD Debridement: Debridement Pre-procedure Yes Verification/Time Out Taken: Start Time: 08:46 Pain Control: Lidocaine 4% Topical Solution Level: Skin/Subcutaneous Tissue Total Area Debrided (L x 0.7 (cm) x 0.6 (cm) = 0.42 (cm) W): Tissue and other Viable, Non-Viable, Fibrin/Slough, Subcutaneous material debrided: Instrument:  Curette Bleeding: Minimum Hemostasis Achieved: Pressure End Time: 08:48 Procedural Pain: 0 Post Procedural Pain: 0 Response to Treatment: Procedure was tolerated well Post Debridement Measurements of Total Wound Length: (cm) 0.7 Width: (cm) 0.6 Depth: (cm) 0.2 Volume: (cm) 0.066 Electronic Signature(s) Signed: 02/08/2015 12:19:52 PM By: [name] Fudge MD, FACS Entered By: [name] on 02/08/2015 08:58:15 [name] ([id number]) -------------------------------------------------------------------------------- Debridement Details Patient Name: [name] Date of Service: 02/08/2015 8:15 AM Medical Record Number: [id number] Patient Account Number: [id number] Date of Birth/Sex: 12-Jan-1928 (81 y.o. Female) Treating RN: Primary Care Physician: [name] Other Clinician: Referring Physician: Constance [name] Treating Physician/Extender: Frann Rider in Treatment: 21 Debridement Performed for Wound #5 Right Calcaneous Assessment: Performed By: Physician Pat Patrick., MD Debridement: Debridement Pre-procedure Yes Verification/Time Out Taken: Start Time: 08:49 Pain Control: Lidocaine 4% Topical Solution Level: Skin/Subcutaneous Tissue Total Area Debrided (L x 1.1 (cm) x 0.5 (cm) = 0.55 (cm) W): Tissue and other Viable, Non-Viable, Fibrin/Slough, Subcutaneous material debrided: Instrument: Curette Bleeding: None End Time: 08:51 Procedural Pain: 0 Post Procedural Pain: 0 Response to Treatment: Procedure was tolerated well Post Debridement Measurements of Total Wound Length: (cm) 1.1 Width: (cm) 0.5 Depth: (cm) 0.2 Volume: (cm) 0.086 Electronic Signature(s) Signed: 02/08/2015 12:19:52 PM By: [name] Fudge MD, FACS Entered By: [name] on 02/08/2015 08:58:44 [name] ([id number]) -------------------------------------------------------------------------------- Debridement Details Patient Name: [name] Date of Service: 02/08/2015  8:15 AM Medical Record Number: [id number] Patient Account Number: [id number] Date of Birth/Sex: May 09, 1928 (82 y.o. Female) Treating RN: Primary Care Physician: [name] Other Clinician: Referring Physician: Constance [name] Treating Physician/Extender: Frann Rider in Treatment: 21 Debridement Performed for Wound #6 Left Achilles Assessment: Performed By: Physician Pat Patrick., MD Debridement: Debridement Pre-procedure Yes Verification/Time Out Taken: Start Time: 08:48 Pain Control: Lidocaine 4% Topical Solution Level: Skin/Subcutaneous Tissue Total Area Debrided (L x 1.2 (cm) x 1 (cm) = 1.2 (cm) W): Tissue and other Viable, Non-Viable, Fibrin/Slough, Subcutaneous material debrided: Instrument: Curette Bleeding: None End Time: 08:49 Procedural  Pain: 0 Post Procedural Pain: 0 Response to Treatment: Procedure was tolerated well Post Debridement Measurements of Total Wound Length: (cm) 1.2 Width: (cm) 1 Depth: (cm) 0.1 Volume: (cm) 0.094 Electronic Signature(s) Signed: 02/08/2015 12:19:52 PM By: [name] Fudge MD, FACS Entered By: [name] on 02/08/2015 08:59:08 [name] ([id number]) -------------------------------------------------------------------------------- HPI Details Patient Name: [name] Date of Service: 02/08/2015 8:15 AM Medical Record Number: [id number] Patient Account Number: [id number] Date of Birth/Sex: [birth year] (81 y.o. Female) Treating RN: Primary Care Physician: [name] Other Clinician: Referring Physician: Constance [name] Treating Physician/Extender: Frann Rider in Treatment: 21 History of Present Illness Location: left heel Quality: Patient reports experiencing a dull pain to affected area(s). Severity: Patient states wound are getting worse. Duration: Patient states that they are not certain how long the wound has been present. Timing: Pain in wound is Intermittent (comes and goes Context: The wound  appeared gradually over time Associated Signs and Symptoms: Patient reports having difficulty standing for long periods. HPI Description: This 81 year old patient is a poor historian and comes from a nursing home. Does not have any family members with her. Says she has a ulcer on the lateral part of her left foot and some new ones are there on her right foot too. She is unable to say how long she's had these. she does walk with a walker and this is limited most of the day. Does not recall if she has had any vascular studies done. I have reviewed an x-ray done of the left foot which basically shows osteoporosis but no evidence of fracture dislocation or osteomyelitis. 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 She seems to be doing better overall. 10/19/14 -- I understand she has gone to the vascular surgery and lab yesterday and has had all her tests done yesterday. She is doing fine otherwise and has had no fresh issues. 10/26/2014 she seems to be doing well and has no fresh problems and seems much brighter. 10/12/14 -- Taking her antibiotic and continues to do her dressings locally and she is helped by her skilled nursing. She seems to be doing fine and has no fresh issues. 10/19/14 -- she is doing fine and has had no fresh issues. She says her son to go for her vascular workup yesterday and she has been called back after 3 months. we will try and gather these reports to review what they said. 10/26/14 --I have been able to review notes from the vascular surgery office and these were dated from 10/18/2014. He patient was on her first postop visit and had Doppler ultrasounds done of her arteries. She had more than 50% stenosis of the right superficial femoral artery and more than 50% stenosis in the left tibioperoneal trunk. She  was status post a left lower extremity angiogram with angioplasty of the peroneal and left superficial femoral arteries for a nonhealing left foot and ankle ulceration. note is made of the fact that the ABIs had not changed in the last month since her previous visit. The opinion of her provider was that she did not need any intervention at this time and they had done everything they could at the present time. They would continue on Plavix and antibiotics and see her back in 3 months for a another arterial duplex study of the lower extremities. [place], [place] ([id number])  11/02/2014 -- she has spoken to her caregivers about coming for HBO 5 times per week for 6-8 weeks and they are agreeable about this and she is willing to undergo hyperbaric oxygen therapy. 11/16/2014 she has been worked up with a EKG and chest x-ray and these were within normal limits. As far as the investigations go she is ready for HBOT. we are awaiting some insurance clearance so that she can start on her hyperbaric oxygen therapy. 11/30/2014 she tolerated hyperbaric oxygen therapy fine and there were no issues with her blood glucose levels. As noted she is not a diabetic and most of her fingerstick blood glucose levels are inaccurate because of the Raynaud's phenomena. She normally has to have your lobe sticks to get any random glucose levels. I have also requested her primary care does a venous blood glucose draw to check her glucose level. 12/21/2014 -- she is tolerating hyperbaric oxygen very well and has overall made a lot of progress. Her left leg is looking much better. 01/11/2015 - No new complaints today. New ulceration on the dorsum of the right second toe noted today on physical exam. Patient unaware. No significant pain. No fever or chills. No significant drainage. Offloading with sage boots at night. Significantly improved with HBO. 01/19/2015 - the patient has had a x-ray of the right foot done at the nursing  home but we still do not have the report and we are awaiting this result. She is otherwise doing fine with her general health. 01/25/2015 -- X-ray of her right foot showed no acute fracture, dislocation, bony infection or osteomyelitis. The was some soft tissue swelling and osteopenia seen. Electronic Signature(s) Signed: 02/08/2015 12:19:52 PM By: [name] Fudge MD, FACS Entered By: [name] on 02/08/2015 08:59:19 LEIGHANNA, REYNOLDS ([id number]) -------------------------------------------------------------------------------- Physical Exam Details Patient Name: [name] Date of Service: 02/08/2015 8:15 AM Medical Record Number: [id number] Patient Account Number: [id number] Date of Birth/Sex: [birth year] (81 y.o. Female) Treating RN: Primary Care Physician: [name] Other Clinician: Referring Physician: Constance [name] Treating Physician/Extender: Frann Rider in Treatment: 21 Constitutional . Pulse regular. Respirations normal and unlabored. Afebrile. . Eyes Nonicteric. Reactive to light. Ears, Nose, Mouth, and Throat Lips, teeth, and gums WNL.Marland Kitchen Moist mucosa without lesions . Neck supple and nontender. No palpable supraclavicular or cervical adenopathy. Normal sized without goiter. Respiratory WNL. No retractions.. Cardiovascular Pedal Pulses WNL. No clubbing, cyanosis or edema. Musculoskeletal Adexa without tenderness or enlargement.. Digits and nails w/o clubbing, cyanosis, infection, petechiae, ischemia, or inflammatory conditions.. Integumentary (Hair, Skin) all the wounds look much smaller and cleaner and but for minimal fibrin and slough which can be sharply debrided she is overall looking well.. No crepitus or fluctuance. No peri-wound warmth or erythema. No masses.. Electronic Signature(s) Signed: 02/08/2015 12:19:52 PM By: [name] Fudge MD, FACS Entered By: [name] on 02/08/2015 08:59:54 [name]  ([id number]) -------------------------------------------------------------------------------- Physician Orders Details Patient Name: [name] Date of Service: 02/08/2015 8:15 AM Medical Record Number: [id number] Patient Account Number: [id number] Date of Birth/Sex: [birth year] (81 y.o. Female) Treating RN: Montey Hora Primary Care Physician: [name] Other Clinician: Referring Physician: Constance [name] Treating Physician/Extender: Frann Rider in Treatment: 67 Verbal / Phone Orders: Yes Clinician: Montey Hora Read Back and Verified: Yes Diagnosis Coding Wound Cleansing Wound #1 Left,Lateral Calcaneous o Clean wound with Normal Saline. - be sure to clean out all remaining prisma Wound #5 Right Calcaneous o Clean wound with Normal Saline. - be sure to clean out all remaining prisma Wound #6 Left  Achilles o Clean wound with Normal Saline. - be sure to clean out all remaining prisma Anesthetic Wound #1 Left,Lateral Calcaneous o Topical Lidocaine 4% cream applied to wound bed prior to debridement o Hurricaine Topical Anesthetic Spray applied to wound bed prior to debridement Wound #5 Right Calcaneous o Topical Lidocaine 4% cream applied to wound bed prior to debridement o Hurricaine Topical Anesthetic Spray applied to wound bed prior to debridement Wound #6 Left Achilles o Topical Lidocaine 4% cream applied to wound bed prior to debridement o Hurricaine Topical Anesthetic Spray applied to wound bed prior to debridement Primary Wound Dressing Wound #1 Left,Lateral Calcaneous o Prisma Ag - or collagen with silver equivalent Wound #5 Right Calcaneous o Prisma Ag - or collagen with silver equivalent Wound #6 Left Achilles o Prisma Ag - or collagen with silver equivalent Secondary Dressing Wound #1 Left,Lateral Calcaneous o Boardered Foam Dressing [place], [place] ([id number]) Wound #5 Right Calcaneous o Boardered Foam Dressing Wound #6  Left Achilles o Boardered Foam Dressing Dressing Change Frequency Wound #1 Left,Lateral Calcaneous o Change dressing every other day. Wound #5 Right Calcaneous o Change dressing every other day. Wound #6 Left Achilles o Change dressing every other day. Follow-up Appointments Wound #1 Left,Lateral Calcaneous o Return Appointment in 1 week. Wound #5 Right Calcaneous o Return Appointment in 1 week. Wound #6 Left Achilles o Return Appointment in 1 week. Off-Loading Wound #1 Left,Lateral Calcaneous o Other: - SAGE boots while sitting or lying. Do not wear while ambulating. Wound #5 Right Calcaneous o Other: - SAGE boots while sitting or lying. Do not wear while ambulating. Wound #6 Left Achilles o Other: - SAGE boots while sitting or lying. Do not wear while ambulating. Home Health Wound #1 [place] Visits - [place] Nurse may visit PRN to address patientos wound care needs. o FACE TO FACE ENCOUNTER: MEDICARE and MEDICAID PATIENTS: I certify that this patient is under my care and that I had a face-to-face encounter that meets the physician face-to-face encounter requirements with this patient on this date. The encounter with the patient was in whole or in part for the following MEDICAL CONDITION: (primary reason for [place]) MEDICAL NECESSITY: I certify, that based on my findings, NURSING services are a medically necessary home health service. HOME BOUND STATUS: I certify that my clinical findings support that this patient is homebound (i.e., Due to illness or injury, pt requires aid of SHAW, ALICE ([id number]) supportive devices such as crutches, cane, wheelchairs, walkers, the use of special transportation or the assistance of another person to leave their place of residence. There is a normal inability to leave the home and doing so requires considerable and taxing effort. Other absences are  for medical reasons / religious services and are infrequent or of short duration when for other reasons). o If current dressing causes regression in wound condition, may D/C ordered dressing product/s and apply Normal Saline Moist Dressing daily until next [place] / Other MD appointment. [place] of regression in wound condition at [phone]. o Please direct any NON-WOUND related issues/requests for orders to patient's Primary Care Physician Wound #5 Right [place] Visits - [place] Nurse may visit PRN to address patientos wound care needs. o FACE TO FACE ENCOUNTER: MEDICARE and MEDICAID PATIENTS: I certify that this patient is under my care and that I had a face-to-face encounter that meets the physician face-to-face encounter requirements with this  patient on this date. The encounter with the patient was in whole or in part for the following MEDICAL CONDITION: (primary reason for [place]) MEDICAL NECESSITY: I certify, that based on my findings, NURSING services are a medically necessary home health service. HOME BOUND STATUS: I certify that my clinical findings support that this patient is homebound (i.e., Due to illness or injury, pt requires aid of supportive devices such as crutches, cane, wheelchairs, walkers, the use of special transportation or the assistance of another person to leave their place of residence. There is a normal inability to leave the home and doing so requires considerable and taxing effort. Other absences are for medical reasons / religious services and are infrequent or of short duration when for other reasons). o If current dressing causes regression in wound condition, may D/C ordered dressing product/s and apply Normal Saline Moist Dressing daily until next [place] / Other MD appointment. [place] of regression in wound condition at  [phone]. o Please direct any NON-WOUND related issues/requests for orders to patient's Primary Care Physician Wound #6 Left Achilles o [place] Visits - [place] Nurse may visit PRN to address patientos wound care needs. o FACE TO FACE ENCOUNTER: MEDICARE and MEDICAID PATIENTS: I certify that this patient is under my care and that I had a face-to-face encounter that meets the physician face-to-face encounter requirements with this patient on this date. The encounter with the patient was in whole or in part for the following MEDICAL CONDITION: (primary reason for [place]) MEDICAL NECESSITY: I certify, that based on my findings, NURSING services are a medically necessary home health service. HOME BOUND STATUS: I certify that my clinical findings support that this patient is homebound (i.e., Due to illness or injury, pt requires aid of supportive devices such as crutches, cane, wheelchairs, walkers, the use of special transportation or the assistance of another person to leave their place of residence. There is a normal inability to leave the home and doing so requires considerable and taxing effort. Other absences are for medical reasons / religious services and are infrequent or of short duration when for other reasons). o If current dressing causes regression in wound condition, may D/C ordered dressing product/s and apply Normal Saline Moist Dressing daily until next [place] / Other MD appointment. [place] of regression in wound condition at [phone]. RAYEN, SAUSED[place] ([id number]) o Please direct any NON-WOUND related issues/requests for orders to patient's Primary Care Physician Electronic Signature(s) Signed: 02/08/2015 12:19:52 PM By: [name] Fudge MD, FACS Signed: 02/08/2015 5:48:32 PM By: Montey Hora Entered By: Montey Hora on 02/08/2015 08:49:15 [name]  ([id number]) -------------------------------------------------------------------------------- Problem List Details Patient Name: [name] Date of Service: 02/08/2015 8:15 AM Medical Record Number: [id number] Patient Account Number: [id number] Date of Birth/Sex: July 13, 1928 (81 y.o. 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/26/2014 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 Female) Treating RN: Primary Care Physician: [name] Other Clinician: Referring Physician: Constance [name] Treating Physician/Extender: Frann Rider in Treatment: 21 Subjective Chief Complaint Information obtained from Patient Patient presents to the wound care center today with an open arterial ulcer on the left foot big toe and lateral heal. She also has some  superficial ulcerations on the second and third toe dorsum. she has no family member with her today and she is a very poor historian and cannot give a proper history. As stated that she walks around with a walker. History of Present Illness (HPI) The following HPI elements were documented for the patient's wound: Location: left heel Quality: Patient reports experiencing a dull pain to affected area(s). Severity: Patient states wound are getting worse. Duration: Patient states that they are not certain how long the wound has been present. Timing: Pain in wound is Intermittent (comes and goes Context: The wound appeared gradually over time Associated Signs and Symptoms: Patient reports having difficulty standing for long periods. This 81 year old patient is a poor historian and comes from a nursing home. Does not have any family members with her. Says she has a ulcer on the lateral part of her left foot and some new ones are there on her right foot too. She is unable to say how long she's had these. she does walk with a walker and this is limited most of the day. Does not recall if she has had any vascular studies done. I have reviewed an x-ray done of the left foot which basically shows osteoporosis but no evidence of fracture dislocation or osteomyelitis. 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 She seems to be doing better overall. 10/19/14 -- I understand she has gone to the vascular surgery and lab yesterday and has had all her tests done yesterday. She is doing fine otherwise and has had no fresh issues. 10/26/2014 she seems to be doing well and has no fresh problems and seems much brighter. 10/12/14 -- Taking her antibiotic and continues to do her dressings locally and she is helped by her skilled nursing. She seems to  be doing fine and has no fresh issues. 10/19/14 -- she is doing fine and has had no fresh issues. She says her son to go for her vascular workup yesterday and she has been called back after 3 months. we will try and gather these reports to review what DENISSE, LEHR ([id number]) they said. 10/26/14 --I have been able to review notes from the vascular surgery office and these were dated from 10/18/2014. He patient was on her first postop visit and had Doppler ultrasounds done of her arteries. She had more than 50% stenosis of the right superficial femoral artery and more than 50% stenosis in the left tibioperoneal trunk. She was status post a left lower extremity angiogram with angioplasty of the peroneal and left superficial femoral arteries for a nonhealing left foot and ankle ulceration. note is made of the fact that the ABIs had not changed in the last month since  her previous visit. The opinion of her provider was that she did not need any intervention at this time and they had done everything they could at the present time. They would continue on Plavix and antibiotics and see her back in 3 months for a another arterial duplex study of the lower extremities. 11/02/2014 -- she has spoken to her caregivers about coming for HBO 5 times per week for 6-8 weeks and they are agreeable about this and she is willing to undergo hyperbaric oxygen therapy. 11/16/2014 she has been worked up with a EKG and chest x-ray and these were within normal limits. As far as the investigations go she is ready for HBOT. we are awaiting some insurance clearance so that she can start on her hyperbaric oxygen therapy. 11/30/2014 she tolerated hyperbaric oxygen therapy fine and there were no issues with her blood glucose levels. As noted she is not a diabetic and most of her fingerstick blood glucose levels are inaccurate because of the Raynaud's phenomena. She normally has to have your lobe sticks to get any  random glucose levels. I have also requested her primary care does a venous blood glucose draw to check her glucose level. 12/21/2014 -- she is tolerating hyperbaric oxygen very well and has overall made a lot of progress. Her left leg is looking much better. 01/11/2015 - No new complaints today. New ulceration on the dorsum of the right second toe noted today on physical exam. Patient unaware. No significant pain. No fever or chills. No significant drainage. Offloading with sage boots at night. Significantly improved with HBO. 01/19/2015 - the patient has had a x-ray of the right foot done at the nursing home but we still do not have the report and we are awaiting this result. She is otherwise doing fine with her general health. 01/25/2015 -- X-ray of her right foot showed no acute fracture, dislocation, bony infection or osteomyelitis. The was some soft tissue swelling and osteopenia seen. Objective Constitutional Pulse regular. Respirations normal and unlabored. Afebrile. Joseph, Lular ([id number]) Vitals Time Taken: 8:26 AM, Height: 64 in, Weight: 180 lbs, BMI: 30.9, Temperature: 98.5 F, Pulse: 73 bpm, Respiratory Rate: 16 breaths/min, Blood Pressure: 140/58 mmHg. Eyes Nonicteric. Reactive to light. Ears, Nose, Mouth, and Throat Lips, teeth, and gums WNL.Marland Kitchen Moist mucosa without lesions . Neck supple and nontender. No palpable supraclavicular or cervical adenopathy. Normal sized without goiter. Respiratory WNL. No retractions.. Cardiovascular Pedal Pulses WNL. No clubbing, cyanosis or edema. Musculoskeletal Adexa without tenderness or enlargement.. Digits and nails w/o clubbing, cyanosis, infection, petechiae, ischemia, or inflammatory conditions.. Integumentary (Hair, Skin) all the wounds look much smaller and cleaner and but for minimal fibrin and slough which can be sharply debrided she is overall looking well.. No crepitus or fluctuance. No peri-wound warmth or erythema.  No masses.. Wound #1 status is Open. Original cause of wound was Pressure Injury. The wound is located on the Left,Lateral Calcaneous. The wound measures 0.7cm length x 0.6cm width x 0.2cm depth; 0.33cm^2 area and 0.066cm^3 volume. The wound is limited to skin breakdown. There is no tunneling or undermining noted. There is a small amount of serous drainage noted. The wound margin is distinct with the outline attached to the wound base. There is large (67-100%) pink granulation within the wound bed. There is a small (1-33%) amount of necrotic tissue within the wound bed including Adherent Slough. The periwound skin appearance exhibited: Localized Edema, Moist. The periwound skin appearance did not exhibit: Callus, Crepitus, Excoriation, Fluctuance, Friable, Induration,  Rash, Scarring, Dry/Scaly, Maceration, Atrophie Blanche, Cyanosis, Ecchymosis, Hemosiderin Staining, Mottled, Pallor, Rubor, Erythema. Periwound temperature was noted as No Abnormality. The periwound has tenderness on palpation. Wound #5 status is Open. Original cause of wound was Gradually Appeared. The wound is located on the Right Calcaneous. The wound measures 1.1cm length x 0.5cm width x 0.2cm depth; 0.432cm^2 area and 0.086cm^3 volume. The wound is limited to skin breakdown. There is no tunneling or undermining noted. There is a small amount of serosanguineous drainage noted. The wound margin is distinct with the outline attached to the wound base. There is large (67-100%) pink granulation within the wound bed. There is a small (1-33%) amount of necrotic tissue within the wound bed including Adherent Slough. The periwound skin appearance exhibited: Localized Edema, Dry/Scaly, Moist, Hemosiderin Staining. The periwound skin appearance did not exhibit: Callus, Crepitus, Excoriation, Fluctuance, Friable, Induration, Rash, Scarring, Maceration, Atrophie Blanche, Cyanosis, Ecchymosis, Mottled, Pallor, Rubor, Erythema.  [place], [place] ([id number]) temperature was noted as No Abnormality. The periwound has tenderness on palpation. Wound #6 status is Open. Original cause of wound was Gradually Appeared. The wound is located on the Left Achilles. The wound measures 1.2cm length x 1cm width x 0.1cm depth; 0.942cm^2 area and 0.094cm^3 volume. The wound is limited to skin breakdown. There is no tunneling or undermining noted. There is a small amount of serous drainage noted. The wound margin is distinct with the outline attached to the wound base. There is large (67-100%) pink granulation within the wound bed. There is no necrotic tissue within the wound bed. The periwound skin appearance exhibited: Localized Edema, Dry/Scaly, Hemosiderin Staining. The periwound skin appearance did not exhibit: Callus, Crepitus, Excoriation, Fluctuance, Friable, Induration, Rash, Scarring, Maceration, Moist, Atrophie Blanche, Cyanosis, Ecchymosis, Mottled, Pallor, Rubor, Erythema. Periwound temperature was noted as No Abnormality. The periwound has tenderness on palpation. 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 In the meanwhile she is doing well with Prisma AG and foam borders and continues to offload. We will see her back next week. 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 There was a Skin/Subcutaneous Tissue  Debridement [id number] debridement with total area of 0.42 sq cm performed by Pat Patrick., MD. with the following instrument(s): Curette to remove Viable and Non-Viable tissue/material including Fibrin/Slough and Subcutaneous after achieving pain control using Lidocaine 4% Topical Solution. A time out was conducted prior to the start of the procedure. A Minimum amount of bleeding was controlled with Pressure. The procedure was tolerated well with a pain level of 0 throughout and a pain level of 0 following the procedure. Post Debridement Measurements: 0.7cm length x 0.6cm width x 0.2cm depth; 0.066cm^3 volume. Wound #5 VARVARA, WALLACE ([id number]) Wound #5 is an Arterial Insufficiency Ulcer located on the Right Calcaneous . There was a Skin/Subcutaneous Tissue Debridement [id number]) debridement with total area of 0.55 sq cm performed by Tarisa Paola, Jackson Latino., MD. with the following instrument(s): Curette to remove Viable  and Non-Viable tissue/material including Fibrin/Slough and Subcutaneous after achieving pain control using Lidocaine 4% Topical Solution. A time out was conducted prior to the start of the procedure. There was no bleeding. The procedure was tolerated well with a pain level of 0 throughout and a pain level of 0 following the procedure. Post Debridement Measurements: 1.1cm length x 0.5cm width x 0.2cm depth; 0.086cm^3 volume. Wound #6 Wound #6 is an Arterial Insufficiency Ulcer located on the Left Achilles . There was a Skin/Subcutaneous Tissue Debridement [id number]) debridement with total area of 1.2 sq cm performed by Pat Patrick., MD. with the following instrument(s): Curette to remove Viable and Non-Viable tissue/material including Fibrin/Slough and Subcutaneous after achieving pain control using Lidocaine 4% Topical Solution. A time out was conducted prior to the start of the procedure. There was no bleeding. The procedure was tolerated well with a pain level of  0 throughout and a pain level of 0 following the procedure. Post Debridement Measurements: 1.2cm length x 1cm width x 0.1cm depth; 0.094cm^3 volume. Plan Wound Cleansing: Wound #1 Left,Lateral Calcaneous: Clean wound with Normal Saline. - be sure to clean out all remaining prisma Wound #5 Right Calcaneous: Clean wound with Normal Saline. - be sure to clean out all remaining prisma Wound #6 Left Achilles: Clean wound with Normal Saline. - be sure to clean out all remaining prisma Anesthetic: Wound #1 Left,Lateral Calcaneous: Topical Lidocaine 4% cream applied to wound bed prior to debridement Hurricaine Topical Anesthetic Spray applied to wound bed prior to debridement Wound #5 Right Calcaneous: Topical Lidocaine 4% cream applied to wound bed prior to debridement Hurricaine Topical Anesthetic Spray applied to wound bed prior to debridement Wound #6 Left Achilles: Topical Lidocaine 4% cream applied to wound bed prior to debridement Hurricaine Topical Anesthetic Spray applied to wound bed prior to debridement Primary Wound Dressing: Wound #1 Left,Lateral Calcaneous: Prisma Ag - or collagen with silver equivalent Wound #5 Right Calcaneous: Prisma Ag - or collagen with silver equivalent Wound #6 Left Achilles: Prisma Ag - or collagen with silver equivalent Secondary Dressing: Wound #1 Left,Lateral Calcaneous: Boardered Foam Dressing Hugo, Landen ([id number]) Wound #5 Right Calcaneous: Boardered Foam Dressing Wound #6 Left Achilles: Boardered Foam Dressing Dressing Change Frequency: Wound #1 Left,Lateral Calcaneous: Change dressing every other day. Wound #5 Right Calcaneous: Change dressing every other day. Wound #6 Left Achilles: Change dressing every other day. Follow-up Appointments: Wound #1 Left,Lateral Calcaneous: Return Appointment in 1 week. Wound #5 Right Calcaneous: Return Appointment in 1 week. Wound #6 Left Achilles: Return Appointment in 1  week. Off-Loading: Wound #1 Left,Lateral Calcaneous: Other: - SAGE boots while sitting or lying. Do not wear while ambulating. Wound #5 Right Calcaneous: Other: - SAGE boots while sitting or lying. Do not wear while ambulating. Wound #6 Left Achilles: Other: - SAGE boots while sitting or lying. Do not wear while ambulating. Home Health: Wound #1 Left,Lateral Calcaneous: Continue Home Health Visits - [facility] Nurse may visit PRN to address patient s wound care needs. FACE TO FACE ENCOUNTER: MEDICARE and MEDICAID PATIENTS: I certify that this patient is under my care and that I had a face-to-face encounter that meets the physician face-to-face encounter requirements with this patient on this date. The encounter with the patient was in whole or in part for the following MEDICAL CONDITION: (primary reason for [place]) MEDICAL NECESSITY: I certify, that based on my findings, NURSING services are a medically necessary home health service. HOME BOUND STATUS: I certify that my clinical findings  support that this patient is homebound (i.e., Due to illness or injury, pt requires aid of supportive devices such as crutches, cane, wheelchairs, walkers, the use of special transportation or the assistance of another person to leave their place of residence. There is a normal inability to leave the home and doing so requires considerable and taxing effort. Other absences are for medical reasons / religious services and are infrequent or of short duration when for other reasons). If current dressing causes regression in wound condition, may D/C ordered dressing product/s and apply Normal Saline Moist Dressing daily until next [place] / Other MD appointment. [place] of regression in wound condition at [phone]. Please direct any NON-WOUND related issues/requests for orders to patient's Primary Care Physician Wound #5 Right Calcaneous: [place] Visits - [facility] Nurse may visit PRN to address patient s wound care needs. FACE TO FACE ENCOUNTER: MEDICARE and MEDICAID PATIENTS: I certify that this patient is under my care and that I had a face-to-face encounter that meets the physician face-to-face encounter requirements with this patient on this date. The encounter with the patient was in whole or in part for the following MEDICAL CONDITION: (primary reason for [place]) MEDICAL NECESSITY: I certify, that based on my findings, NURSING services are a medically necessary home health service. [place] ([id number]) BOUND STATUS: I certify that my clinical findings support that this patient is homebound (i.e., Due to illness or injury, pt requires aid of supportive devices such as crutches, cane, wheelchairs, walkers, the use of special transportation or the assistance of another person to leave their place of residence. There is a normal inability to leave the home and doing so requires considerable and taxing effort. Other absences are for medical reasons / religious services and are infrequent or of short duration when for other reasons). If current dressing causes regression in wound condition, may D/C ordered dressing product/s and apply Normal Saline Moist Dressing daily until next [place] / Other MD appointment. [place] of regression in wound condition at [phone]. Please direct any NON-WOUND related issues/requests for orders to patient's Primary Care Physician Wound #6 Left Achilles: [place] Visits - [facility] Nurse may visit PRN to address patient s wound care needs. FACE TO FACE ENCOUNTER: MEDICARE and MEDICAID PATIENTS: I certify that this patient is under my care and that I had a face-to-face encounter that meets the physician face-to-face encounter requirements with this patient on this date. The encounter with the patient was in  whole or in part for the following MEDICAL CONDITION: (primary reason for [place]) MEDICAL NECESSITY: I certify, that based on my findings, NURSING services are a medically necessary home health service. HOME BOUND STATUS: I certify that my clinical findings support that this patient is homebound (i.e., Due to illness or injury, pt requires aid of supportive devices such as crutches, cane, wheelchairs, walkers, the use of special transportation or the assistance of another person to leave their place of residence. There is a normal inability to leave the home and doing so requires considerable and taxing effort. Other absences are for medical reasons / religious services and are infrequent or of short duration when for other reasons). If current dressing causes regression in wound condition, may D/C ordered dressing product/s and apply Normal Saline Moist Dressing daily until next [place] / Other MD appointment. [place] of regression in wound condition at  [phone]. Please direct any NON-WOUND related issues/requests for orders to patient's Primary Care Physician We have not been able to process her Epifix through her insurance and we will continue to try. In the meanwhile she is doing well with Prisma AG and foam borders and continues to offload. We will see her back next week. Electronic Signature(s) Signed: 02/08/2015 12:19:52 PM By: [name] Fudge MD, FACS Entered By: [name] on 02/08/2015 09:01:04 [name] ([id number]) -------------------------------------------------------------------------------- SuperBill Details Patient Name: [name] Date of Service: 02/08/2015 Medical Record Number: [id number] Patient Account Number: [id number] Date of Birth/Sex: Jan 12, 1928 (81 y.o. 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 I70.235 Atherosclerosis of native arteries of right leg with ulceration of other part of foot 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 S91.104A Unspecified open wound of right lesser toe(s) without damage to nail, initial encounter Facility Procedures CPT4: Description Modifier Quantity Code [id number] - DEB SUBQ TISSUE 20 SQ CM/< 1 ICD-10 Description Diagnosis 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 leg  with ulceration of heel and midfoot 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 S91.104A Unspecified open wound of right lesser toe(s) without damage to nail, initial encounter Physician Procedures CPT4: Description Modifier Quantity Code [id number] - WC PHYS SUBQ TISS 20 SQ CM 1 ICD-10 Description Diagnosis 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

## 2015-02-09 NOTE — Progress Notes (Signed)
ANAHLA, SPRANDEL ([id number]) Visit Report for 02/08/2015 Arrival Information Details Patient Name: [name], [name] Date of Service: 02/08/2015 8:15 AM Medical Record Number: [id number] Patient Account Number: [id number] Date of Birth/Sex: Nov 25, 1927 (81 y.o. Female) Treating RN: Montey Hora Primary Care Physician: Constance Goltz Other Clinician: Referring Physician: Constance Goltz Treating Physician/Extender: Frann Rider in Treatment: 21 Visit Information History Since Last Visit Added or deleted any medications: No Patient Arrived: Wheel Chair Any new allergies or adverse reactions: No Arrival Time: 08:25 Had a fall or experienced change in No Accompanied By: self activities of daily living that may affect Transfer Assistance: None risk of falls: Patient Identification Verified: Yes Signs or symptoms of abuse/neglect since last No Secondary Verification Process Yes visito Completed: Hospitalized since last visit: No Patient Requires Transmission- No Pain Present Now: No Based Precautions: Patient Has Alerts: Yes Patient Alerts: Patient on Blood Thinner DMII Plavix ABI L 0.63 R 0.79 Electronic Signature(s) Signed: 02/08/2015 5:48:32 PM By: Montey Hora Entered By: Montey Hora on 02/08/2015 08:25:50 [name] ([id number]) -------------------------------------------------------------------------------- Encounter Discharge Information Details Patient Name: [name] Date of Service: 02/08/2015 8:15 AM Medical Record Number: [id number] Patient Account Number: [id number] Date of Birth/Sex: 05-17-1928 (81 y.o. Female) Treating RN: Montey Hora Primary Care Physician: Constance Goltz Other Clinician: Referring Physician: Constance Goltz Treating Physician/Extender: Frann Rider in Treatment: 21 Encounter Discharge Information Items Discharge Pain Level: 0 Discharge Condition: Stable Ambulatory Status: Wheelchair Discharge Destination:  Home Transportation: Private Auto Accompanied By: self Schedule Follow-up Appointment: Yes Medication Reconciliation completed and provided to Patient/Care No Nakaiya Beddow: Provided on Clinical Summary of Care: 02/08/2015 Form Type Recipient Paper Patient DJ Electronic Signature(s) Signed: 02/08/2015 9:05:34 AM By: Ruthine Dose Entered By: Ruthine Dose on 02/08/2015 09:05:34 [name] ([id number]) -------------------------------------------------------------------------------- Lower Extremity Assessment Details Patient Name: [name] Date of Service: 02/08/2015 8:15 AM Medical Record Number: [id number] Patient Account Number: [id number] Date of Birth/Sex: January 25, 1928 (81 y.o. Female) Treating RN: Montey Hora Primary Care Physician: Constance Goltz Other Clinician: Referring Physician: Constance Goltz Treating Physician/Extender: Frann Rider in Treatment: 21 Vascular Assessment Pulses: Posterior Tibial Palpable: [Left:No] [Right:No] Dorsalis Pedis Palpable: [Left:Yes] [Right:Yes] Extremity colors, hair growth, and conditions: Extremity Color: [Left:Normal] [Right:Normal] Hair Growth on Extremity: [Left:No] [Right:No] Temperature of Extremity: [Left:Warm] [Right:Warm] Capillary Refill: [Left:< 3 seconds] [Right:< 3 seconds] Electronic Signature(s) Signed: 02/08/2015 5:48:32 PM By: Montey Hora Entered By: Montey Hora on 02/08/2015 08:39:47 [name] ([id number]) -------------------------------------------------------------------------------- Multi Wound Chart Details Patient Name: [name] Date of Service: 02/08/2015 8:15 AM Medical Record Number: [id number] Patient Account Number: [id number] Date of Birth/Sex: Jul 05, 1928 (81 y.o. Female) Treating RN: Montey Hora Primary Care Physician: Constance Goltz Other Clinician: Referring Physician: Constance Goltz Treating Physician/Extender: Frann Rider in Treatment: 21 Vital  Signs Height(in): 64 Pulse(bpm): 73 Weight(lbs): 180 Blood Pressure 140/58 (mmHg): Body Mass Index(BMI): 31 Temperature(F): 98.5 Respiratory Rate 16 (breaths/min): Photos: [1:No Photos] [5:No Photos] [6:No Photos] Wound Location: [1:Left Calcaneous - Lateral Right Calcaneous] [6:Left Achilles] Wounding Event: [1:Pressure Injury] [5:Gradually Appeared] [6:Gradually Appeared] Primary Etiology: [1:Arterial Insufficiency Ulcer Arterial Insufficiency Ulcer Arterial Insufficiency Ulcer] Secondary Etiology: [1:Pressure Ulcer] [5:N/A] [6:N/A] Comorbid History: [1:Glaucoma, Asthma, Hypertension, Peripheral Hypertension, Peripheral Hypertension, Peripheral Venous Disease, Type II Venous Disease, Type II Venous Disease, Type II Diabetes] [5:Glaucoma, Asthma, Diabetes] [6:Glaucoma, Asthma,  Diabetes] Date Acquired: [1:07/24/2014] [5:09/26/2014] [6:09/26/2014] Weeks of Treatment: [1:21] [5:18] [6:18] Wound Status: [1:Open] [5:Open] [6:Open] Measurements L x W x D 0.7x0.6x0.2 [5:1.1x0.5x0.2] [6:1.2x1x0.1] (cm) Area (cm) : [1:0.33] [5:0.432] [6:0.942] Volume (cm) : [1:0.066] [5:0.086] [6:0.094] %  Reduction in Area: [1:93.50%] [5:92.10%] [6:75.00%] % Reduction in Volume: 96.80% [5:84.40%] [6:95.00%] Classification: [1:Partial Thickness] [5:Full Thickness Without Exposed Support Structures] [6:Partial Thickness] HBO Classification: [1:Grade 1] [5:Grade 1] [6:Grade 1] Exudate Amount: [1:Small] [5:Small] [6:Small] Exudate Type: [1:Serous] [5:Serosanguineous] [6:Serous] Exudate Color: [1:amber] [5:red, brown] [6:amber] Wound Margin: [1:Distinct, outline attached Distinct, outline attached Distinct, outline attached] Granulation Amount: [1:Large (67-100%)] [5:Large (67-100%)] [6:Large (67-100%)] Granulation Quality: [1:Pink] [5:Pink] [6:Pink] Necrotic Amount: [1:Small (1-33%)] [5:Small (1-33%)] [6:None Present (0%)] Exposed Structures: [name] ([id number]) Fascia: No Fascia: No Fascia:  No Fat: No Fat: No Fat: No Tendon: No Tendon: No Tendon: No Muscle: No Muscle: No Muscle: No Joint: No Joint: No Joint: No Bone: No Bone: No Bone: No Limited to Skin Limited to Skin Limited to Skin Breakdown Breakdown Breakdown Epithelialization: Medium (34-66%) Medium (34-66%) Medium (34-66%) Periwound Skin Texture: Edema: Yes Edema: Yes Edema: Yes Excoriation: No Excoriation: No Excoriation: No Induration: No Induration: No Induration: No Callus: No Callus: No Callus: No Crepitus: No Crepitus: No Crepitus: No Fluctuance: No Fluctuance: No Fluctuance: No Friable: No Friable: No Friable: No Rash: No Rash: No Rash: No Scarring: No Scarring: No Scarring: No Periwound Skin Moist: Yes Moist: Yes Dry/Scaly: Yes Moisture: Maceration: No Dry/Scaly: Yes Maceration: No Dry/Scaly: No Maceration: No Moist: No Periwound Skin Color: Atrophie Blanche: No Hemosiderin Staining: Yes Hemosiderin Staining: Yes Cyanosis: No Atrophie Blanche: No Atrophie Blanche: No Ecchymosis: No Cyanosis: No Cyanosis: No Erythema: No Ecchymosis: No Ecchymosis: No Hemosiderin Staining: No Erythema: No Erythema: No Mottled: No Mottled: No Mottled: No Pallor: No Pallor: No Pallor: No Rubor: No Rubor: No Rubor: No Temperature: No Abnormality No Abnormality No Abnormality Tenderness on Yes Yes Yes Palpation: Wound Preparation: Ulcer Cleansing: Ulcer Cleansing: Ulcer Cleansing: Rinsed/Irrigated with Rinsed/Irrigated with Rinsed/Irrigated with Saline Saline Saline Topical Anesthetic Topical Anesthetic Topical Anesthetic Applied: Other: Lidocaine Applied: Other: Lidocaine Applied: Other: lidocaine 4% Cream 4% Ointment 4% Treatment Notes Electronic Signature(s) Signed: 02/08/2015 5:48:32 PM By: Montey Hora Entered By: Montey Hora on 02/08/2015 08:48:11 [name]  ([id number]) -------------------------------------------------------------------------------- Multi-Disciplinary Care Plan Details Patient Name: [name] Date of Service: 02/08/2015 8:15 AM Medical Record Number: [id number] Patient Account Number: [id number] Date of Birth/Sex: [birth year] (81 y.o. Female) Treating RN: Montey Hora Primary Care Physician: Constance Goltz Other Clinician: Referring Physician: Constance Goltz Treating Physician/Extender: Frann Rider in Treatment: 21 Active Inactive Abuse / Safety / Falls / Self Care Management Nursing Diagnoses: Impaired physical mobility Potential for falls Goals: Patient will remain injury free Date Initiated: 09/12/2014 Goal Status: Active Patient/caregiver will verbalize understanding of skin care regimen Date Initiated: 09/12/2014 Goal Status: Active Patient/caregiver will verbalize/demonstrate measures taken to prevent injury and/or falls Date Initiated: 09/12/2014 Goal Status: Active Patient/caregiver will verbalize/demonstrate understanding of what to do in case of emergency Date Initiated: 09/12/2014 Goal Status: Active Interventions: Assess fall risk on admission and as needed Assess: immobility, friction, shearing, incontinence upon admission and as needed Assess impairment of mobility on admission and as needed per policy Provide education on basic hygiene Provide education on fall prevention Provide education on personal and home safety Provide education on safe transfers Treatment Activities: Education provided on Basic Hygiene : 10/12/2014 Notes: Necrotic Tissue WADE, GERDEMAN ([id number]) Nursing Diagnoses: Impaired tissue integrity related to necrotic/devitalized tissue Knowledge deficit related to management of necrotic/devitalized tissue Goals: Necrotic/devitalized tissue will be minimized in the wound bed Date Initiated: 09/12/2014 Goal Status: Active Patient/caregiver will verbalize understanding  of reason and process for debridement of necrotic tissue Date Initiated: 09/12/2014 Goal Status:  Active Interventions: Assess patient pain level pre-, during and post procedure and prior to discharge Provide education on necrotic tissue and debridement process Treatment Activities: Apply topical anesthetic as ordered : 02/08/2015 Enzymatic debridement : 02/08/2015 Excisional debridement : 02/08/2015 Notes: Orientation to the Wound Care Program Nursing Diagnoses: Knowledge deficit related to the wound healing center program Goals: Patient/caregiver will verbalize understanding of the [place] Program Date Initiated: 09/12/2014 Goal Status: Active Interventions: Provide education on orientation to the wound center Notes: Pressure Nursing Diagnoses: Knowledge deficit related to causes and risk factors for pressure ulcer development Knowledge deficit related to management of pressures ulcers Potential for impaired tissue integrity related to pressure, friction, moisture, and shear GoalsPRESCIOUS, SUCHER ([id number]) Patient will remain free from development of additional pressure ulcers Date Initiated: 09/12/2014 Goal Status: Active Patient will remain free of pressure ulcers Date Initiated: 09/12/2014 Goal Status: Active Patient/caregiver will verbalize risk factors for pressure ulcer development Date Initiated: 09/12/2014 Goal Status: Active Patient/caregiver will verbalize understanding of pressure ulcer management Date Initiated: 09/12/2014 Goal Status: Active Interventions: Assess: immobility, friction, shearing, incontinence upon admission and as needed Assess offloading mechanisms upon admission and as needed Assess potential for pressure ulcer upon admission and as needed Provide education on pressure ulcers Treatment Activities: Pressure reduction/relief device ordered : 02/08/2015 Notes: Wound/Skin Impairment Nursing Diagnoses: Impaired tissue  integrity Knowledge deficit related to ulceration/compromised skin integrity Goals: Patient/caregiver will verbalize understanding of skin care regimen Date Initiated: 09/12/2014 Goal Status: Active Ulcer/skin breakdown will heal within 14 weeks Date Initiated: 09/12/2014 Goal Status: Active Interventions: Assess patient/caregiver ability to obtain necessary supplies Assess patient/caregiver ability to perform ulcer/skin care regimen upon admission and as needed Assess ulceration(s) every visit Provide education on ulcer and skin care Treatment Activities: Skin care regimen initiated : 02/08/2015 NADJAH, LAWING ([id number]) Topical wound management initiated : 02/08/2015 Notes: Electronic Signature(s) Signed: 02/08/2015 5:48:32 PM By: Montey Hora Entered By: Montey Hora on 02/08/2015 08:48:05 [name] ([id number]) -------------------------------------------------------------------------------- Patient/Caregiver Education Details Patient Name: [name] Date of Service: 02/08/2015 8:15 AM Medical Record Number: [id number] Patient Account Number: [id number] Date of Birth/Gender: 16-Oct-1927 (81 y.o. Female) Treating RN: Montey Hora Primary Care Physician: Constance Goltz Other Clinician: Referring Physician: Constance Goltz Treating Physician/Extender: Frann Rider in Treatment: 21 Education Assessment Education Provided To: Patient Education Topics Provided Wound/Skin Impairment: Handouts: Other: wound care as ordered Methods: Demonstration, Explain/Verbal Responses: State content correctly Electronic Signature(s) Signed: 02/08/2015 5:48:32 PM By: Montey Hora Entered By: Montey Hora on 02/08/2015 08:52:49 [name] ([id number]) -------------------------------------------------------------------------------- Wound Assessment Details Patient Name: [name] Date of Service: 02/08/2015 8:15 AM Medical Record Number: [id number] Patient  Account Number: [id number] Date of Birth/Sex: [birth year] (81 y.o. Female) Treating RN: Montey Hora Primary Care Physician: Constance Goltz Other Clinician: Referring Physician: Constance Goltz Treating Physician/Extender: Frann Rider in Treatment: 21 Wound Status Wound Number: 1 Primary Arterial Insufficiency Ulcer Etiology: Wound Location: Left Calcaneous - Lateral Secondary Pressure Ulcer Wounding Event: Pressure Injury Etiology: Date Acquired: 07/24/2014 Wound Open Weeks Of Treatment: 21 Status: Clustered Wound: No Comorbid Glaucoma, Asthma, Hypertension, History: Peripheral Venous Disease, Type II Diabetes Photos Photo Uploaded By: Montey Hora on 02/08/2015 17:43:39 Wound Measurements Length: (cm) 0.7 Width: (cm) 0.6 Depth: (cm) 0.2 Area: (cm) 0.33 Volume: (cm) 0.066 % Reduction in Area: 93.5% % Reduction in Volume: 96.8% Epithelialization: Medium (34-66%) Tunneling: No Undermining: No Wound Description Classification: Partial Thickness Foul O Diabetic Severity (Wagner): Grade 1 Wound Margin: Distinct, outline attached Exudate Amount: Small Exudate Type: Serous Exudate  Color: amber dor After Cleansing: No Wound Bed Granulation Amount: Large (67-100%) Exposed Structure [place], [place] ([id number]) Granulation Quality: Pink Fascia Exposed: No Necrotic Amount: Small (1-33%) Fat Layer Exposed: No Necrotic Quality: Adherent Slough Tendon Exposed: No Muscle Exposed: No Joint Exposed: No Bone Exposed: No Limited to Skin Breakdown Periwound Skin Texture Texture Color No Abnormalities Noted: No No Abnormalities Noted: No Callus: No Atrophie Blanche: No Crepitus: No Cyanosis: No Excoriation: No Ecchymosis: No Fluctuance: No Erythema: No Friable: No Hemosiderin Staining: No Induration: No Mottled: No Localized Edema: Yes Pallor: No Rash: No Rubor: No Scarring: No Temperature / Pain Moisture Temperature: No Abnormality No Abnormalities  Noted: No Tenderness on Palpation: Yes Dry / Scaly: No Maceration: No Moist: Yes Wound Preparation Ulcer Cleansing: Rinsed/Irrigated with Saline Topical Anesthetic Applied: Other: Lidocaine 4% Cream, Treatment Notes Wound #1 (Left, Lateral Calcaneous) 1. Cleansed with: Clean wound with Normal Saline 2. Anesthetic Topical Lidocaine 4% cream to wound bed prior to debridement 4. Dressing Applied: Prisma Ag 5. Secondary Dressing Applied Bordered Foam Dressing Electronic Signature(s) Signed: 02/08/2015 5:48:32 PM By: Montey Hora Entered By: Montey Hora on 02/08/2015 08:40:17 [name] ([id number]) -------------------------------------------------------------------------------- Wound Assessment Details Patient Name: [name] Date of Service: 02/08/2015 8:15 AM Medical Record Number: [id number] Patient Account Number: [id number] Date of Birth/Sex: Mar 14, 1928 (81 y.o. Female) Treating RN: Montey Hora Primary Care Physician: Constance Goltz Other Clinician: Referring Physician: Constance Goltz Treating Physician/Extender: Frann Rider in Treatment: 21 Wound Status Wound Number: 5 Primary Arterial Insufficiency Ulcer Etiology: Wound Location: Right Calcaneous Wound Open Wounding Event: Gradually Appeared Status: Date Acquired: 09/26/2014 Comorbid Glaucoma, Asthma, Hypertension, Weeks Of Treatment: 18 History: Peripheral Venous Disease, Type II Clustered Wound: No Diabetes Photos Photo Uploaded By: Montey Hora on 02/08/2015 17:43:39 Wound Measurements Length: (cm) 1.1 Width: (cm) 0.5 Depth: (cm) 0.2 Area: (cm) 0.432 Volume: (cm) 0.086 % Reduction in Area: 92.1% % Reduction in Volume: 84.4% Epithelialization: Medium (34-66%) Tunneling: No Undermining: No Wound Description Full Thickness Without Exposed Foul Odor A Classification: Support Structures Diabetic Severity Grade 1 (Wagner): Wound Margin: Distinct, outline attached Exudate  Amount: Small Exudate Type: Serosanguineous Exudate Color: red, brown fter Cleansing: No Wound Bed Granulation Amount: Large (67-100%) Exposed Structure [place], [place] ([id number]) Granulation Quality: Pink Fascia Exposed: No Necrotic Amount: Small (1-33%) Fat Layer Exposed: No Necrotic Quality: Adherent Slough Tendon Exposed: No Muscle Exposed: No Joint Exposed: No Bone Exposed: No Limited to Skin Breakdown Periwound Skin Texture Texture Color No Abnormalities Noted: No No Abnormalities Noted: No Callus: No Atrophie Blanche: No Crepitus: No Cyanosis: No Excoriation: No Ecchymosis: No Fluctuance: No Erythema: No Friable: No Hemosiderin Staining: Yes Induration: No Mottled: No Localized Edema: Yes Pallor: No Rash: No Rubor: No Scarring: No Temperature / Pain Moisture Temperature: No Abnormality No Abnormalities Noted: No Tenderness on Palpation: Yes Dry / Scaly: Yes Maceration: No Moist: Yes Wound Preparation Ulcer Cleansing: Rinsed/Irrigated with Saline Topical Anesthetic Applied: Other: Lidocaine 4% Ointment, Treatment Notes Wound #5 (Right Calcaneous) 1. Cleansed with: Clean wound with Normal Saline 2. Anesthetic Topical Lidocaine 4% cream to wound bed prior to debridement 4. Dressing Applied: Prisma Ag 5. Secondary Dressing Applied Bordered Foam Dressing Electronic Signature(s) Signed: 02/08/2015 5:48:32 PM By: Montey Hora Entered By: Montey Hora on 02/08/2015 08:41:00 [place], [place] ([id number]) -------------------------------------------------------------------------------- Wound Assessment Details Patient Name: [name] Date of Service: 02/08/2015 8:15 AM Medical Record Number: [id number] Patient Account Number: [id number] Date of Birth/Sex: 04-Feb-1928 (81 y.o. Female) Treating RN: Montey Hora Primary Care Physician: Constance Goltz Other Clinician: Referring  Physician: Constance Goltz Treating Physician/Extender: Frann Rider in Treatment: 21 Wound Status Wound Number: 6 Primary Arterial Insufficiency Ulcer Etiology: Wound Location: Left Achilles Wound Open Wounding Event: Gradually Appeared Status: Date Acquired: 09/26/2014 Comorbid Glaucoma, Asthma, Hypertension, Weeks Of Treatment: 18 History: Peripheral Venous Disease, Type II Clustered Wound: No Diabetes Photos Photo Uploaded By: Montey Hora on 02/08/2015 17:44:02 Wound Measurements Length: (cm) 1.2 Width: (cm) 1 Depth: (cm) 0.1 Area: (cm) 0.942 Volume: (cm) 0.094 % Reduction in Area: 75% % Reduction in Volume: 95% Epithelialization: Medium (34-66%) Tunneling: No Undermining: No Wound Description Classification: Partial Thickness Foul O Diabetic Severity (Wagner): Grade 1 Wound Margin: Distinct, outline attached Exudate Amount: Small Exudate Type: Serous Exudate Color: amber dor After Cleansing: No Wound Bed Granulation Amount: Large (67-100%) Exposed Structure Granulation Quality: Pink Fascia Exposed: No Necrotic Amount: None Present (0%) Fat Layer Exposed: No [place], Narya ([id number]) Tendon Exposed: No Muscle Exposed: No Joint Exposed: No Bone Exposed: No Limited to Skin Breakdown Periwound Skin Texture Texture Color No Abnormalities Noted: No No Abnormalities Noted: No Callus: No Atrophie Blanche: No Crepitus: No Cyanosis: No Excoriation: No Ecchymosis: No Fluctuance: No Erythema: No Friable: No Hemosiderin Staining: Yes Induration: No Mottled: No Localized Edema: Yes Pallor: No Rash: No Rubor: No Scarring: No Temperature / Pain Moisture Temperature: No Abnormality No Abnormalities Noted: No Tenderness on Palpation: Yes Dry / Scaly: Yes Maceration: No Moist: No Wound Preparation Ulcer Cleansing: Rinsed/Irrigated with Saline Topical Anesthetic Applied: Other: lidocaine 4%, Treatment Notes Wound #6 (Left Achilles) 1. Cleansed with: Clean wound with Normal Saline 2.  Anesthetic Topical Lidocaine 4% cream to wound bed prior to debridement 4. Dressing Applied: Prisma Ag 5. Secondary Dressing Applied Bordered Foam Dressing Electronic Signature(s) Signed: 02/08/2015 5:48:32 PM By: Montey Hora Entered By: Montey Hora on 02/08/2015 08:41:16 [name] ([id number]) -------------------------------------------------------------------------------- [place] Details Patient Name: [name] Date of Service: 02/08/2015 8:15 AM Medical Record Number: [id number] Patient Account Number: [id number] Date of Birth/Sex: 1928-04-23 (81 y.o. Female) Treating RN: Montey Hora Primary Care Physician: Constance Goltz Other Clinician: Referring Physician: Constance Goltz Treating Physician/Extender: Frann Rider in Treatment: 21 Vital Signs Time Taken: 08:26 Temperature (F): 98.5 Height (in): 64 Pulse (bpm): 73 Weight (lbs): 180 Respiratory Rate (breaths/min): 16 Body Mass Index (BMI): 30.9 Blood Pressure (mmHg): 140/58 Reference Range: 80 - 120 mg / dl Electronic Signature(s) Signed: 02/08/2015 5:48:32 PM By: Montey Hora Entered By: Montey Hora on 02/08/2015 [id number]

## 2015-02-15 ENCOUNTER — Encounter: Payer: Medicare Other | Admitting: Surgery

## 2015-02-15 DIAGNOSIS — L97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: Secondary | ICD-10-CM | POA: Diagnosis not present

## 2015-02-16 NOTE — Progress Notes (Signed)
CAMBELL, CLOPTON ([id number]) Visit Report for 02/15/2015 Chief Complaint Document Details Patient Name: [name], [name] Date of Service: 02/15/2015 8:15 AM Medical Record Number: [id number] Patient Account Number: [id number] Date of Birth/Sex: [birth year] (81 y.o. 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 She also has some superficial ulcerations on the second and third toe dorsum. she has no family member with her today and she is a very poor historian and cannot give a proper history. As stated that she walks around with a walker. Electronic Signature(s) Signed: 02/15/2015 1:47:03 PM By: Christin Fudge MD, FACS Entered By: Christin Fudge on 02/15/2015 09:00:20 [name], [name] ([id number]) -------------------------------------------------------------------------------- Debridement Details Patient Name: [name] Date of Service: 02/15/2015 8:15 AM Medical Record Number: [id number] Patient Account Number: [id number] Date of Birth/Sex: 1928/02/04 (81 y.o. Female) Treating RN: Primary Care Physician: Constance Goltz Other Clinician: Referring Physician: Constance Goltz Treating Physician/Extender: Frann Rider in Treatment: 22 Debridement Performed for Wound #1 Left,Lateral Calcaneous Assessment: Performed By: Physician Pat Patrick., MD Debridement: Debridement Pre-procedure Yes Verification/Time Out Taken: Start Time: 08:34 Pain Control: Other : lidocaine 4% Level: Skin/Subcutaneous Tissue Total Area Debrided (L x 0.4 (cm) x 0.5 (cm) = 0.2 (cm) W): Tissue and other Viable, Non-Viable, Fibrin/Slough, Subcutaneous material debrided: Instrument: Curette Bleeding:  Minimum Hemostasis Achieved: Pressure End Time: 08:38 Procedural Pain: 0 Post Procedural Pain: 0 Response to Treatment: Procedure was tolerated well Post Debridement Measurements of Total Wound Length: (cm) 0.4 Width: (cm) 0.5 Depth: (cm) 0.2 Volume: (cm) 0.031 Electronic Signature(s) Signed: 02/15/2015 1:47:03 PM By: Christin Fudge MD, FACS Entered By: Christin Fudge on 02/15/2015 08:59:36 [name], [name] ([id number]) -------------------------------------------------------------------------------- Debridement Details Patient Name: [name] Date of Service: 02/15/2015 8:15 AM Medical Record Number: [id number] Patient Account Number: [id number] Date of Birth/Sex: 1928-07-04 (81 y.o. Female) Treating RN: Primary Care Physician: Constance Goltz Other Clinician: Referring Physician: Constance Goltz Treating Physician/Extender: Frann Rider in Treatment: 22 Debridement Performed for Wound #5 Right Calcaneous Assessment: Performed By: Physician Pat Patrick., MD Debridement: Debridement Pre-procedure Yes Verification/Time Out Taken: Start Time: 08:34 Pain Control: Other : lidocaine 4% Level: Skin/Subcutaneous Tissue Total Area Debrided (L x 1 (cm) x 0.3 (cm) = 0.3 (cm) W): Tissue and other Viable, Non-Viable, Fibrin/Slough, Subcutaneous material debrided: Instrument: Curette Bleeding: Minimum Hemostasis Achieved: Pressure End Time: 08:38 Procedural Pain: 0 Post Procedural Pain: 0 Response to Treatment: Procedure was tolerated well Post Debridement Measurements of Total Wound Length: (cm) 1 Width: (cm) 0.3 Depth: (cm) 0.5 Volume: (cm) 0.118 Electronic Signature(s) Signed: 02/15/2015 1:47:03 PM By: Christin Fudge MD, FACS Entered By: Christin Fudge on 02/15/2015 08:59:51 [name] ([id number]) -------------------------------------------------------------------------------- Debridement Details Patient Name: [name] Date of Service: 02/15/2015  8:15 AM Medical Record Number: [id number] Patient Account Number: [id number] Date of Birth/Sex: 1927/12/25 (81 y.o. Female) Treating RN: Primary Care Physician: Constance Goltz Other Clinician: Referring Physician: Constance Goltz Treating Physician/Extender: Frann Rider in Treatment: 22 Debridement Performed for Wound #6 Left Achilles Assessment: Performed By: Physician Pat Patrick., MD Debridement: Debridement Pre-procedure Yes Verification/Time Out Taken: Start Time: 08:34 Pain Control: Other : lidocaine 4% Level: Skin/Subcutaneous Tissue Total Area Debrided (L x 1.2 (cm) x 0.8 (cm) = 0.96 (cm) W): Tissue and other Viable, Non-Viable, Fibrin/Slough, Subcutaneous material debrided: Instrument: Curette Bleeding: Minimum Hemostasis  Achieved: Pressure End Time: 08:38 Procedural Pain: 0 Post Procedural Pain: 0 Response to Treatment: Procedure was tolerated well Post Debridement Measurements of Total Wound Length: (cm) 1.2 Width: (cm) 0.8 Depth: (cm) 0.2 Volume: (cm) 0.151 Electronic Signature(s) Signed: 02/15/2015 1:47:03 PM By: Christin Fudge MD, FACS Entered By: Christin Fudge on 02/15/2015 [name], [name] ([id number]) -------------------------------------------------------------------------------- HPI Details Patient Name: [name] Date of Service: 02/15/2015 8:15 AM Medical Record Number: [id number] Patient Account Number: [id number] Date of Birth/Sex: 1928-03-11 (81 y.o. Female) Treating RN: Primary Care Physician: Constance Goltz Other Clinician: Referring Physician: Constance Goltz Treating Physician/Extender: Frann Rider in Treatment: 22 History of Present Illness Location: left heel Quality: Patient reports experiencing a dull pain to affected area(s). Severity: Patient states wound are getting worse. Duration: Patient states that they are not certain how long the wound has been present. Timing: Pain in wound is Intermittent (comes  and goes Context: The wound appeared gradually over time Associated Signs and Symptoms: Patient reports having difficulty standing for long periods. HPI Description: This 81 year old patient is a poor historian and comes from a nursing home. Does not have any family members with her. Says she has a ulcer on the lateral part of her left foot and some new ones are there on her right foot too. She is unable to say how long she's had these. she does walk with a walker and this is limited most of the day. Does not recall if she has had any vascular studies done. I have reviewed an x-ray done of the left foot which basically shows osteoporosis but no evidence of fracture dislocation or osteomyelitis. 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 She seems to be doing better overall. 10/19/14 -- I understand she has gone to the vascular surgery and lab yesterday and has had all her tests done yesterday. She is doing fine otherwise and has had no fresh issues. 10/26/2014 she seems to be doing well and has no fresh problems and seems much brighter. 10/12/14 -- Taking her antibiotic and continues to do her dressings locally and she is helped by her skilled nursing. She seems to be doing fine and has no fresh issues. 10/19/14 -- she is doing fine and has had no fresh issues. She says her son to go for her vascular workup yesterday and she has been called back after 3 months. we will try and gather these reports to review what they said. 10/26/14 --I have been able to review notes from the vascular surgery office and these were dated from 10/18/2014. He patient was on her first postop visit and had Doppler ultrasounds done of her arteries. She had more than 50% stenosis of the right superficial femoral artery and more than 50% stenosis in the  left tibioperoneal trunk. She was status post a left lower extremity angiogram with angioplasty of the peroneal and left superficial femoral arteries for a nonhealing left foot and ankle ulceration. note is made of the fact that the ABIs had not changed in the last month since her previous visit. The opinion of her provider was that she did not need any intervention at this time and they had done everything they could at the present time. They would continue on Plavix and antibiotics and see her back in 3 months for a another arterial duplex study of  the lower extremities. [name], [name] ([id number]) 11/02/2014 -- she has spoken to her caregivers about coming for HBO 5 times per week for 6-8 weeks and they are agreeable about this and she is willing to undergo hyperbaric oxygen therapy. 11/16/2014 she has been worked up with a EKG and chest x-ray and these were within normal limits. As far as the investigations go she is ready for HBOT. we are awaiting some insurance clearance so that she can start on her hyperbaric oxygen therapy. 11/30/2014 she tolerated hyperbaric oxygen therapy fine and there were no issues with her blood glucose levels. As noted she is not a diabetic and most of her fingerstick blood glucose levels are inaccurate because of the Raynaud's phenomena. She normally has to have your lobe sticks to get any random glucose levels. I have also requested her primary care does a venous blood glucose draw to check her glucose level. 12/21/2014 -- she is tolerating hyperbaric oxygen very well and has overall made a lot of progress. Her left leg is looking much better. 01/11/2015 - No new complaints today. New ulceration on the dorsum of the right second toe noted today on physical exam. Patient unaware. No significant pain. No fever or chills. No significant drainage. Offloading with sage boots at night. Significantly improved with HBO. 01/19/2015 - the patient has had a x-ray of the  right foot done at the nursing home but we still do not have the report and we are awaiting this result. She is otherwise doing fine with her general health. 01/25/2015 -- X-ray of her right foot showed no acute fracture, dislocation, bony infection or osteomyelitis. The was some soft tissue swelling and osteopenia seen. 02/15/2015 -- due to some insurance issues we could not get her epifix and we are working with her company to get her this. She is doing fine otherwise and has no fresh issues. Electronic Signature(s) Signed: 02/15/2015 1:47:03 PM By: Christin Fudge MD, FACS Entered By: Christin Fudge on 02/15/2015 09:00:58 [name], [name] ([id number]) -------------------------------------------------------------------------------- Physical Exam Details Patient Name: [name] Date of Service: 02/15/2015 8:15 AM Medical Record Number: [id number] Patient Account Number: [id number] Date of Birth/Sex: Aug 03, 1928 (81 y.o. Female) Treating RN: Primary Care Physician: Constance Goltz Other Clinician: Referring Physician: Constance Goltz Treating Physician/Extender: Frann Rider in Treatment: 22 Constitutional . Pulse regular. Respirations normal and unlabored. Afebrile. . Eyes Nonicteric. Reactive to light. Ears, Nose, Mouth, and Throat Lips, teeth, and gums WNL.Marland Kitchen Moist mucosa without lesions . Neck supple and nontender. No palpable supraclavicular or cervical adenopathy. Normal sized without goiter. Respiratory WNL. No retractions.. Cardiovascular Pedal Pulses WNL. No clubbing, cyanosis or edema. Musculoskeletal Adexa without tenderness or enlargement.. Digits and nails w/o clubbing, cyanosis, infection, petechiae, ischemia, or inflammatory conditions.. Integumentary (Hair, Skin) the wounds have diminished in size but have minimal debris which will be sharply debrided with a curette.Marland Kitchen No crepitus or fluctuance. No peri-wound warmth or erythema. No masses.Marland Kitchen Psychiatric Judgement  and insight Intact.. No evidence of depression, anxiety, or agitation.. Electronic Signature(s) Signed: 02/15/2015 1:47:03 PM By: Christin Fudge MD, FACS Entered By: Christin Fudge on 02/15/2015 09:01:37 [name] ([id number]) -------------------------------------------------------------------------------- Physician Orders Details Patient Name: [name] Date of Service: 02/15/2015 8:15 AM Medical Record Number: [id number] Patient Account Number: [id number] Date of Birth/Sex: 15-Sep-1927 (81 y.o. Female) Treating RN: Cornell Barman Primary Care Physician: Constance Goltz Other Clinician: Referring Physician: Constance Goltz Treating Physician/Extender: Frann Rider in Treatment: 53 Verbal / Phone Orders: Yes Clinician: Cornell Barman Read Back and Verified: Yes  Diagnosis Coding Wound Cleansing Wound #1 Left,Lateral Calcaneous o Clean wound with Normal Saline. - be sure to clean out all remaining prisma Wound #5 Right Calcaneous o Clean wound with Normal Saline. - be sure to clean out all remaining prisma Wound #6 Left Achilles o Clean wound with Normal Saline. - be sure to clean out all remaining prisma Anesthetic Wound #1 Left,Lateral Calcaneous o Topical Lidocaine 4% cream applied to wound bed prior to debridement Wound #5 Right Calcaneous o Topical Lidocaine 4% cream applied to wound bed prior to debridement Wound #6 Left Achilles o Topical Lidocaine 4% cream applied to wound bed prior to debridement Primary Wound Dressing Wound #1 Left,Lateral Calcaneous o Prisma Ag - or collagen with silver equivalent Wound #5 Right Calcaneous o Prisma Ag - or collagen with silver equivalent Wound #6 Left Achilles o Prisma Ag - or collagen with silver equivalent Secondary Dressing Wound #1 Left,Lateral Calcaneous o Boardered Foam Dressing Wound #5 Right Calcaneous o Boardered Foam Dressing [place], Carita ([id number]) Wound #6 Left Achilles o Boardered Foam  Dressing Dressing Change Frequency Wound #1 Left,Lateral Calcaneous o Change dressing every other day. Wound #5 Right Calcaneous o Change dressing every other day. Wound #6 Left Achilles o Change dressing every other day. Follow-up Appointments Wound #1 Left,Lateral Calcaneous o Return Appointment in 1 week. Wound #5 Right Calcaneous o Return Appointment in 1 week. Wound #6 Left Achilles o Return Appointment in 1 week. Off-Loading Wound #1 Left,Lateral Calcaneous o Other: - SAGE boots while sitting or lying. Do not wear while ambulating. Wound #5 Right Calcaneous o Other: - SAGE boots while sitting or lying. Do not wear while ambulating. Wound #6 Left Achilles o Other: - SAGE boots while sitting or lying. Do not wear while ambulating. Home Health Wound #1 [place] Visits - [name] Nurse may visit PRN to address patientos wound care needs. o FACE TO FACE ENCOUNTER: MEDICARE and MEDICAID PATIENTS: I certify that this patient is under my care and that I had a face-to-face encounter that meets the physician face-to-face encounter requirements with this patient on this date. The encounter with the patient was in whole or in part for the following MEDICAL CONDITION: (primary reason for [name]) MEDICAL NECESSITY: I certify, that based on my findings, NURSING services are a medically necessary home health service. HOME BOUND STATUS: I certify that my clinical findings support that this patient is homebound (i.e., Due to illness or injury, pt requires aid of supportive devices such as crutches, cane, wheelchairs, walkers, the use of special transportation or the assistance of another person to leave their place of residence. There is a normal inability to leave the home and doing so requires considerable and taxing effort. [name], [name] ([id number]) absences are for medical reasons / religious  services and are infrequent or of short duration when for other reasons). o If current dressing causes regression in wound condition, may D/C ordered dressing product/s and apply Normal Saline Moist Dressing daily until next [place] / Other MD appointment. [place] of regression in wound condition at [phone]. o Please direct any NON-WOUND related issues/requests for orders to patient's Primary Care Physician Wound #5 Right [place] Visits - [place] Nurse may visit PRN to address patientos wound care needs. o FACE TO FACE ENCOUNTER: MEDICARE and MEDICAID PATIENTS: I certify that this patient is under my care and that I had a face-to-face encounter  that meets the physician face-to-face encounter requirements with this patient on this date. The encounter with the patient was in whole or in part for the following MEDICAL CONDITION: (primary reason for [place]) MEDICAL NECESSITY: I certify, that based on my findings, NURSING services are a medically necessary home health service. HOME BOUND STATUS: I certify that my clinical findings support that this patient is homebound (i.e., Due to illness or injury, pt requires aid of supportive devices such as crutches, cane, wheelchairs, walkers, the use of special transportation or the assistance of another person to leave their place of residence. There is a normal inability to leave the home and doing so requires considerable and taxing effort. Other absences are for medical reasons / religious services and are infrequent or of short duration when for other reasons). o If current dressing causes regression in wound condition, may D/C ordered dressing product/s and apply Normal Saline Moist Dressing daily until next [place] / Other MD appointment. [place] of regression in wound condition at [phone]. o Please direct any  NON-WOUND related issues/requests for orders to patient's Primary Care Physician Wound #6 Left Achilles o [place] Visits - [place] Nurse may visit PRN to address patientos wound care needs. o FACE TO FACE ENCOUNTER: MEDICARE and MEDICAID PATIENTS: I certify that this patient is under my care and that I had a face-to-face encounter that meets the physician face-to-face encounter requirements with this patient on this date. The encounter with the patient was in whole or in part for the following MEDICAL CONDITION: (primary reason for [place]) MEDICAL NECESSITY: I certify, that based on my findings, NURSING services are a medically necessary home health service. HOME BOUND STATUS: I certify that my clinical findings support that this patient is homebound (i.e., Due to illness or injury, pt requires aid of supportive devices such as crutches, cane, wheelchairs, walkers, the use of special transportation or the assistance of another person to leave their place of residence. There is a normal inability to leave the home and doing so requires considerable and taxing effort. Other absences are for medical reasons / religious services and are infrequent or of short duration when for other reasons). o If current dressing causes regression in wound condition, may D/C ordered dressing product/s and apply Normal Saline Moist Dressing daily until next [place] / Other MD appointment. [place] of regression in wound condition at [phone]. o Please direct any NON-WOUND related issues/requests for orders to patient's Primary Care Physician [name], [name] ([id number]) Notes Once insurance updated, order Epifix for next visit. Electronic Signature(s) Signed: 02/15/2015 1:47:03 PM By: Christin Fudge MD, FACS Signed: 02/16/2015 4:20:29 PM By: Gretta Cool RN, BSN, Kim RN, BSN Entered By: Gretta Cool, RN, BSN, Kim on 02/15/2015 08:49:11 [place],  [name] ([id number]) -------------------------------------------------------------------------------- Problem List Details Patient Name: MARRAH, CESARO Date of Service: 02/15/2015 8:15 AM Medical Record Number: [id number] Patient Account Number: [id number] Date of Birth/Sex: 1928-02-11 (81 y.o. 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/2014 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 Female) Treating RN: Primary Care Physician: Constance Goltz Other Clinician: Referring Physician: Constance Goltz Treating Physician/Extender: Frann Rider in Treatment: 22 Subjective Chief Complaint Information obtained from Patient Patient presents to the wound care center today with an open arterial ulcer on the left foot big toe and lateral heal. She also has some  superficial ulcerations on the second and third toe dorsum. she has no family member with her today and she is a very poor historian and cannot give a proper history. As stated that she walks around with a walker. History of Present Illness (HPI) The following HPI elements were documented for the patient's wound: Location: left heel Quality: Patient reports experiencing a dull pain to affected area(s). Severity: Patient states wound are getting worse. Duration: Patient states that they are not certain how long the wound has been present. Timing: Pain in wound is Intermittent (comes and goes Context: The wound appeared gradually over time Associated Signs and Symptoms: Patient reports having difficulty standing for long periods. This 81 year old patient is a poor historian and comes from a nursing home. Does not have any family members with her. Says she has a ulcer on the lateral part of her left foot and some new ones are there on her right foot too. She is unable to say how long she's had these. she does walk with a walker and this is limited most of the day. Does not recall if she has had any vascular studies done. I have reviewed an x-ray done of the left foot which basically shows osteoporosis but no evidence of fracture dislocation or osteomyelitis. 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 She seems to be doing better overall. 10/19/14 -- I understand she has gone to the vascular surgery and lab yesterday and has had all her tests done yesterday. She is doing fine otherwise and has had no fresh issues. 10/26/2014 she seems to be doing well and has no fresh problems and seems much brighter. 10/12/14 -- Taking her antibiotic and continues to do her dressings locally and she is helped by her skilled nursing. She seems to  be doing fine and has no fresh issues. 10/19/14 -- she is doing fine and has had no fresh issues. She says her son to go for her vascular workup yesterday and she has been called back after 3 months. we will try and gather these reports to review what [name], [name] ([id number]) they said. 10/26/14 --I have been able to review notes from the vascular surgery office and these were dated from 10/18/2014. He patient was on her first postop visit and had Doppler ultrasounds done of her arteries. She had more than 50% stenosis of the right superficial femoral artery and more than 50% stenosis in the left tibioperoneal trunk. She was status post a left lower extremity angiogram with angioplasty of the peroneal and left superficial femoral arteries for  a nonhealing left foot and ankle ulceration. note is made of the fact that the ABIs had not changed in the last month since her previous visit. The opinion of her provider was that she did not need any intervention at this time and they had done everything they could at the present time. They would continue on Plavix and antibiotics and see her back in 3 months for a another arterial duplex study of the lower extremities. 11/02/2014 -- she has spoken to her caregivers about coming for HBO 5 times per week for 6-8 weeks and they are agreeable about this and she is willing to undergo hyperbaric oxygen therapy. 11/16/2014 she has been worked up with a EKG and chest x-ray and these were within normal limits. As far as the investigations go she is ready for HBOT. we are awaiting some insurance clearance so that she can start on her hyperbaric oxygen therapy. 11/30/2014 she tolerated hyperbaric oxygen therapy fine and there were no issues with her blood glucose levels. As noted she is not a diabetic and most of her fingerstick blood glucose levels are inaccurate because of the Raynaud's phenomena. She normally has to have your lobe sticks to get any  random glucose levels. I have also requested her primary care does a venous blood glucose draw to check her glucose level. 12/21/2014 -- she is tolerating hyperbaric oxygen very well and has overall made a lot of progress. Her left leg is looking much better. 01/11/2015 - No new complaints today. New ulceration on the dorsum of the right second toe noted today on physical exam. Patient unaware. No significant pain. No fever or chills. No significant drainage. Offloading with sage boots at night. Significantly improved with HBO. 01/19/2015 - the patient has had a x-ray of the right foot done at the nursing home but we still do not have the report and we are awaiting this result. She is otherwise doing fine with her general health. 01/25/2015 -- X-ray of her right foot showed no acute fracture, dislocation, bony infection or osteomyelitis. The was some soft tissue swelling and osteopenia seen. 02/15/2015 -- due to some insurance issues we could not get her epifix and we are working with her company to get her this. She is doing fine otherwise and has no fresh issues. Objective Constitutional [name], [name] ([id number]) Pulse regular. Respirations normal and unlabored. Afebrile. Vitals Time Taken: 8:10 AM, Height: 64 in, Weight: 180 lbs, BMI: 30.9, Temperature: 97.9 F, Pulse: 68 bpm, Respiratory Rate: 18 breaths/min, Blood Pressure: 143/52 mmHg. Eyes Nonicteric. Reactive to light. Ears, Nose, Mouth, and Throat Lips, teeth, and gums WNL.Marland Kitchen Moist mucosa without lesions . Neck supple and nontender. No palpable supraclavicular or cervical adenopathy. Normal sized without goiter. Respiratory WNL. No retractions.. Cardiovascular Pedal Pulses WNL. No clubbing, cyanosis or edema. Musculoskeletal Adexa without tenderness or enlargement.. Digits and nails w/o clubbing, cyanosis, infection, petechiae, ischemia, or inflammatory conditions.Marland Kitchen Psychiatric Judgement and insight Intact.. No evidence of  depression, anxiety, or agitation.. Integumentary (Hair, Skin) the wounds have diminished in size but have minimal debris which will be sharply debrided with a curette.Marland Kitchen No crepitus or fluctuance. No peri-wound warmth or erythema. No masses.. Wound #1 status is Open. Original cause of wound was Pressure Injury. The wound is located on the Left,Lateral Calcaneous. The wound measures 0.4cm length x 0.5cm width x 0.2cm depth; 0.157cm^2 area and 0.031cm^3 volume. The wound is limited to skin breakdown. There is a small amount of serous drainage noted. The wound margin is  distinct with the outline attached to the wound base. There is no granulation within the wound bed. There is a large (67-100%) amount of necrotic tissue within the wound bed including Adherent Slough. The periwound skin appearance exhibited: Moist. The periwound skin appearance did not exhibit: Callus, Crepitus, Excoriation, Fluctuance, Friable, Induration, Localized Edema, Rash, Scarring, Dry/Scaly, Maceration, Atrophie Blanche, Cyanosis, Ecchymosis, Hemosiderin Staining, Mottled, Pallor, Rubor, Erythema. Periwound temperature was noted as No Abnormality. The periwound has tenderness on palpation. Wound #5 status is Open. Original cause of wound was Gradually Appeared. The wound is located on the Right Calcaneous. The wound measures 1cm length x 0.3cm width x 0.4cm depth; 0.236cm^2 area and 0.094cm^3 volume. The wound is limited to skin breakdown. There is a small amount of serosanguineous drainage noted. The wound margin is distinct with the outline attached to the wound base. There is large (67-100%) pink granulation within the wound bed. There is a small (1-33%) amount of necrotic tissue within the wound bed including Adherent Slough. The periwound skin appearance exhibited: Moist, Hemosiderin Gower, Hedaya ([id number]) Staining. The periwound skin appearance did not exhibit: Callus, Crepitus, Excoriation, Fluctuance,  Friable, Induration, Localized Edema, Rash, Scarring, Dry/Scaly, Maceration, Atrophie Blanche, Cyanosis, Ecchymosis, Mottled, Pallor, Rubor, Erythema. Periwound temperature was noted as No Abnormality. The periwound has tenderness on palpation. Wound #6 status is Open. Original cause of wound was Gradually Appeared. The wound is located on the Left Achilles. The wound measures 1.2cm length x 0.8cm width x 0.1cm depth; 0.754cm^2 area and 0.075cm^3 volume. The wound is limited to skin breakdown. There is a small amount of serous drainage noted. The wound margin is distinct with the outline attached to the wound base. There is medium (34-66%) pink granulation within the wound bed. There is a medium (34-66%) amount of necrotic tissue within the wound bed including Adherent Slough. The periwound skin appearance exhibited: Moist, Hemosiderin Staining. The periwound skin appearance did not exhibit: Callus, Crepitus, Excoriation, Fluctuance, Friable, Induration, Localized Edema, Rash, Scarring, Dry/Scaly, Maceration, Atrophie Blanche, Cyanosis, Ecchymosis, Mottled, Pallor, Rubor, Erythema. Periwound temperature was noted as No Abnormality. The periwound has tenderness on palpation. 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 We will continue with [employer], bordered foam and an appropriate offloading  method. She will come back and see as next week. 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 There was a Skin/Subcutaneous Tissue Debridement [id number] debridement with total area of 0.2 sq cm performed by Pat Patrick., MD. with the following instrument(s): Curette to remove Viable and Non-Viable tissue/material including Fibrin/Slough and Subcutaneous after achieving pain control using Other (lidocaine 4%). A time out was conducted prior to the start of the procedure. A Minimum amount of bleeding was Frederickson, TENESHIA ([id number]) controlled with Pressure. The procedure was tolerated well with a pain level of 0 throughout and a pain level of 0 following the procedure.  Post Debridement Measurements: 0.4cm length x 0.5cm width x 0.2cm depth; 0.031cm^3 volume. Wound #5 Wound #5 is an Arterial Insufficiency Ulcer located on the Right Calcaneous . There was a Skin/Subcutaneous Tissue Debridement [id number] debridement with total area of 0.3 sq cm performed by Pat Patrick., MD. with the following instrument(s): Curette to remove Viable and Non-Viable tissue/material including Fibrin/Slough and Subcutaneous after achieving pain control using Other (lidocaine 4%). A time out was conducted prior to the start of the procedure. A Minimum amount of bleeding was controlled with Pressure. The procedure was tolerated well with a pain level of 0 throughout and a pain level of 0 following the procedure. Post Debridement Measurements: 1cm length x 0.3cm width x 0.5cm depth; 0.118cm^3 volume. Wound #6 Wound #6 is an Arterial Insufficiency Ulcer located on the Left Achilles . There was a Skin/Subcutaneous Tissue Debridement [id number]) debridement with total area of 0.96 sq cm performed by Xylon Croom, Jackson Latino., MD. with the following instrument(s): Curette to remove Viable and Non-Viable tissue/material including Fibrin/Slough and  Subcutaneous after achieving pain control using Other (lidocaine 4%). A time out was conducted prior to the start of the procedure. A Minimum amount of bleeding was controlled with Pressure. The procedure was tolerated well with a pain level of 0 throughout and a pain level of 0 following the procedure. Post Debridement Measurements: 1.2cm length x 0.8cm width x 0.2cm depth; 0.151cm^3 volume. Plan Wound Cleansing: Wound #1 Left,Lateral Calcaneous: Clean wound with Normal Saline. - be sure to clean out all remaining prisma Wound #5 Right Calcaneous: Clean wound with Normal Saline. - be sure to clean out all remaining prisma Wound #6 Left Achilles: Clean wound with Normal Saline. - be sure to clean out all remaining prisma Anesthetic: Wound #1 Left,Lateral Calcaneous: Topical Lidocaine 4% cream applied to wound bed prior to debridement Wound #5 Right Calcaneous: Topical Lidocaine 4% cream applied to wound bed prior to debridement Wound #6 Left Achilles: Topical Lidocaine 4% cream applied to wound bed prior to debridement Primary Wound Dressing: Wound #1 Left,Lateral Calcaneous: Prisma Ag - or collagen with silver equivalent Wound #5 Right Calcaneous: Prisma Ag - or collagen with silver equivalent Wound #6 Left Achilles: JONELL, BARGIEL ([id number]) Prisma Ag - or collagen with silver equivalent Secondary Dressing: Wound #1 Left,Lateral Calcaneous: Boardered Foam Dressing Wound #5 Right Calcaneous: Boardered Foam Dressing Wound #6 Left Achilles: Boardered Foam Dressing Dressing Change Frequency: Wound #1 Left,Lateral Calcaneous: Change dressing every other day. Wound #5 Right Calcaneous: Change dressing every other day. Wound #6 Left Achilles: Change dressing every other day. Follow-up Appointments: Wound #1 Left,Lateral Calcaneous: Return Appointment in 1 week. Wound #5 Right Calcaneous: Return Appointment in 1 week. Wound #6 Left Achilles: Return Appointment in 1  week. Off-Loading: Wound #1 Left,Lateral Calcaneous: Other: - SAGE boots while sitting or lying. Do not wear while ambulating. Wound #5 Right Calcaneous: Other: - SAGE boots while sitting or lying. Do not wear while ambulating. Wound #6 Left Achilles: Other: - SAGE boots while sitting or lying. Do not wear while ambulating. Home Health: Wound #1 Left,Lateral Calcaneous: Continue Home Health Visits - [facility] Nurse may visit PRN to address patient s wound care needs. FACE TO FACE ENCOUNTER: MEDICARE and MEDICAID PATIENTS: I certify that this patient is under my care and that I had a face-to-face encounter that meets the physician face-to-face encounter requirements with this patient on this date. The encounter with the patient was in whole or in part for the following MEDICAL CONDITION: (  primary reason for Home Healthcare) MEDICAL NECESSITY: I certify, that based on my findings, NURSING services are a medically necessary home health service. HOME BOUND STATUS: I certify that my clinical findings support that this patient is homebound (i.e., Due to illness or injury, pt requires aid of supportive devices such as crutches, cane, wheelchairs, walkers, the use of special transportation or the assistance of another person to leave their place of residence. There is a normal inability to leave the home and doing so requires considerable and taxing effort. Other absences are for medical reasons / religious services and are infrequent or of short duration when for other reasons). If current dressing causes regression in wound condition, may D/C ordered dressing product/s and apply Normal Saline Moist Dressing daily until next [place] / Other MD appointment. [place] of regression in wound condition at [phone]. Please direct any NON-WOUND related issues/requests for orders to patient's Primary Care Physician Wound #5 Right Calcaneous: [place] Visits - [facility] Nurse may visit PRN to address patient s wound care needs. FACE TO FACE ENCOUNTER: MEDICARE and MEDICAID PATIENTS: I certify that this patient is under Ruth, [name] ([id number]) my care and that I had a face-to-face encounter that meets the physician face-to-face encounter requirements with this patient on this date. The encounter with the patient was in whole or in part for the following MEDICAL CONDITION: (primary reason for Anthony) MEDICAL NECESSITY: I certify, that based on my findings, NURSING services are a medically necessary home health service. HOME BOUND STATUS: I certify that my clinical findings support that this patient is homebound (i.e., Due to illness or injury, pt requires aid of supportive devices such as crutches, cane, wheelchairs, walkers, the use of special transportation or the assistance of another person to leave their place of residence. There is a normal inability to leave the home and doing so requires considerable and taxing effort. Other absences are for medical reasons / religious services and are infrequent or of short duration when for other reasons). If current dressing causes regression in wound condition, may D/C ordered dressing product/s and apply Normal Saline Moist Dressing daily until next [place] / Other MD appointment. [place] of regression in wound condition at [phone]. Please direct any NON-WOUND related issues/requests for orders to patient's Primary Care Physician Wound #6 Left Achilles: [place] Visits - [facility] Nurse may visit PRN to address patient s wound care needs. FACE TO FACE ENCOUNTER: MEDICARE and MEDICAID PATIENTS: I certify that this patient is under my care and that I had a face-to-face encounter that meets the physician face-to-face encounter requirements with this patient on this date. The encounter with the patient was in  whole or in part for the following MEDICAL CONDITION: (primary reason for [place]) MEDICAL NECESSITY: I certify, that based on my findings, NURSING services are a medically necessary home health service. HOME BOUND STATUS: I certify that my clinical findings support that this patient is homebound (i.e., Due to illness or injury, pt requires aid of supportive devices such as crutches, cane, wheelchairs, walkers, the use of special transportation or the assistance of another person to leave their place of residence. There is a normal inability to leave the home and doing so requires considerable and taxing effort. Other absences are for medical reasons / religious services and are infrequent or of short duration when for other reasons). If current dressing causes regression in wound  condition, may D/C ordered dressing product/s and apply Normal Saline Moist Dressing daily until next [place] / Other MD appointment. [place] of regression in wound condition at [phone]. Please direct any NON-WOUND related issues/requests for orders to patient's Primary Care Physician General Notes: Once insurance updated, order Epifix for next visit. The patient is doing very well and at this stage I would like very much to apply a skin substitute and we are working with her insurance to get this authorized. We will continue with Prisma AG, bordered foam and an appropriate offloading method. She will come back and see as next week. Electronic Signature(s) Signed: 02/15/2015 1:47:03 PM By: Christin Fudge MD, FACS Entered By: Christin Fudge on 02/15/2015 09:03:16 [name] ([id number]) -------------------------------------------------------------------------------- SuperBill Details Patient Name: [name] Date of Service: 02/15/2015 Medical Record Number: [id number] Patient Account Number: [id number] Date of Birth/Sex: 23-Apr-1928 (81 y.o. 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 I70.235 Atherosclerosis of native arteries of right leg with ulceration of other part of foot 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 S91.104A Unspecified open wound of right lesser toe(s) without damage to nail, initial encounter Facility Procedures CPT4: Description Modifier Quantity Code [id number] - DEB SUBQ TISSUE 20 SQ CM/< 1 ICD-10 Description Diagnosis 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 of native arteries of right leg with ulceration of other part of foot 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 Physician Procedures CPT4: Description Modifier Quantity Code [id number] - WC PHYS SUBQ TISS 20 SQ CM 1 ICD-10 Description Diagnosis 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

## 2015-02-16 NOTE — Progress Notes (Signed)
[name], [name] ([id number]) Visit Report for 02/15/2015 Arrival Information Details Patient Name: [name], [name] Date of Service: 02/15/2015 8:15 AM Medical Record Number: [id number] Patient Account Number: [id number] Date of Birth/Sex: 1927-10-26 (81 y.o. Female) Treating RN: Cornell Barman Primary Care Physician: Constance Goltz Other Clinician: Referring Physician: Constance Goltz Treating Physician/Extender: Frann Rider in Treatment: 7 Visit Information History Since Last Visit Added or deleted any medications: No Patient Arrived: Wheel Chair Any new allergies or adverse reactions: No Arrival Time: 08:05 Had a fall or experienced change in No Accompanied By: driver activities of daily living that may affect Transfer Assistance: Manual risk of falls: Patient Identification Verified: Yes Signs or symptoms of abuse/neglect since last No Secondary Verification Process Yes visito Completed: Hospitalized since last visit: No Patient Requires Transmission- No Has Dressing in Place as Prescribed: Yes Based Precautions: Pain Present Now: No Patient Has Alerts: Yes Patient Alerts: Patient on Blood Thinner DMII Plavix ABI L 0.63 R 0.79 Electronic Signature(s) Signed: 02/16/2015 4:20:29 PM By: Gretta Cool, RN, BSN, Kim RN, BSN Entered By: Gretta Cool, RN, BSN, Kim on 02/15/2015 [id number] [name] ([id number]) -------------------------------------------------------------------------------- Encounter Discharge Information Details Patient Name: [name] Date of Service: 02/15/2015 8:15 AM Medical Record Number: [id number] Patient Account Number: [id number] Date of Birth/Sex: 08-May-1928 (81 y.o. Female) Treating RN: Cornell Barman Primary Care Physician: Constance Goltz Other Clinician: Referring Physician: Constance Goltz Treating Physician/Extender: Frann Rider in Treatment: 21 Encounter Discharge Information Items Discharge Pain Level: 0 Discharge Condition: Stable Ambulatory  Status: Wheelchair Discharge Destination: Home Transportation: Private Auto Accompanied By: transportation Schedule Follow-up Appointment: Yes Medication Reconciliation completed Yes and provided to Patient/Care Phi Avans: Patient Clinical Summary of Care: Declined Electronic Signature(s) Signed: 02/15/2015 10:42:00 AM By: Ruthine Dose Entered By: Ruthine Dose on 02/15/2015 10:42:00 [name] ([id number]) -------------------------------------------------------------------------------- Lower Extremity Assessment Details Patient Name: [name] Date of Service: 02/15/2015 8:15 AM Medical Record Number: [id number] Patient Account Number: [id number] Date of Birth/Sex: 1927/11/16 (81 y.o. Female) Treating RN: Cornell Barman Primary Care Physician: Constance Goltz Other Clinician: Referring Physician: Constance Goltz Treating Physician/Extender: Frann Rider in Treatment: 22 Vascular Assessment Pulses: Posterior Tibial Dorsalis Pedis Palpable: [Left:No] [Right:No] Doppler: [Left:Monophasic] [Right:Monophasic] Extremity colors, hair growth, and conditions: Extremity Color: [Left:Hyperpigmented] [Right:Hyperpigmented] Hair Growth on Extremity: [Left:No] [Right:No] Temperature of Extremity: [Left:Warm] [Right:Warm] Capillary Refill: [Left:< 3 seconds] [Right:< 3 seconds] Toe Nail Assessment Left: Right: Thick: No No Discolored: No No Deformed: No No Improper Length and Hygiene: No No Electronic Signature(s) Signed: 02/16/2015 4:20:29 PM By: Gretta Cool, RN, BSN, Kim RN, BSN Entered By: Gretta Cool, RN, BSN, Kim on 02/15/2015 08:18:16 [name] ([id number]) -------------------------------------------------------------------------------- Multi Wound Chart Details Patient Name: [name] Date of Service: 02/15/2015 8:15 AM Medical Record Number: [id number] Patient Account Number: [id number] Date of Birth/Sex: 14-Aug-1928 (81 y.o. Female) Treating RN: Cornell Barman Primary Care  Physician: Constance Goltz Other Clinician: Referring Physician: Constance Goltz Treating Physician/Extender: Frann Rider in Treatment: 22 Vital Signs Height(in): 64 Pulse(bpm): 68 Weight(lbs): 180 Blood Pressure 143/52 (mmHg): Body Mass Index(BMI): 31 Temperature(F): 97.9 Respiratory Rate 18 (breaths/min): Photos: [1:No Photos] [5:No Photos] [6:No Photos] Wound Location: [1:Left Calcaneous - Lateral Right Calcaneous] [6:Left Achilles] Wounding Event: [1:Pressure Injury] [5:Gradually Appeared] [6:Gradually Appeared] Primary Etiology: [1:Arterial Insufficiency Ulcer Arterial Insufficiency Ulcer Arterial Insufficiency Ulcer] Secondary Etiology: [1:Pressure Ulcer] [5:N/A] [6:N/A] Comorbid History: [1:Glaucoma, Asthma, Hypertension, Peripheral Hypertension, Peripheral Hypertension, Peripheral Venous Disease, Type II Venous Disease, Type II Venous Disease, Type II Diabetes] [5:Glaucoma, Asthma, Diabetes] [6:Glaucoma, Asthma,  Diabetes] Date Acquired: [1:07/24/2014] [5:09/26/2014] [6:09/26/2014] Weeks of  Treatment: [1:22] [5:19] [6:19] Wound Status: [1:Open] [5:Open] [6:Open] Measurements L x W x D 0.4x0.5x0.2 [5:1x0.3x0.4] [6:1.2x0.8x0.1] (cm) Area (cm) : [1:0.157] [5:0.236] [6:0.754] Volume (cm) : [1:0.031] [5:0.094] [6:0.075] % Reduction in Area: [1:96.90%] [5:95.70%] [6:80.00%] % Reduction in Volume: 98.50% [5:82.90%] [6:96.00%] Classification: [1:Partial Thickness] [5:Full Thickness Without Exposed Support Structures] [6:Partial Thickness] HBO Classification: [1:Grade 1] [5:Grade 1] [6:Grade 1] Exudate Amount: [1:Small] [5:Small] [6:Small] Exudate Type: [1:Serous] [5:Serosanguineous] [6:Serous] Exudate Color: [1:amber] [5:red, brown] [6:amber] Wound Margin: [1:Distinct, outline attached Distinct, outline attached Distinct, outline attached] Granulation Amount: [1:None Present (0%)] [5:Large (67-100%)] [6:Medium (34-66%)] Granulation Quality: [1:N/A] [5:Pink]  [6:Pink] Necrotic Amount: [1:Large (67-100%)] [5:Small (1-33%)] [6:Medium (34-66%)] Exposed Structures: [place], Ronnie Doss ([id number]) Fascia: No Fascia: No Fascia: No Fat: No Fat: No Fat: No Tendon: No Tendon: No Tendon: No Muscle: No Muscle: No Muscle: No Joint: No Joint: No Joint: No Bone: No Bone: No Bone: No Limited to Skin Limited to Skin Limited to Skin Breakdown Breakdown Breakdown Epithelialization: Medium (34-66%) Medium (34-66%) Medium (34-66%) Debridement: Debridement [id number] Debridement [id number] Debridement ([id number]) [id number]) [id number]) Time-Out Taken: Yes Yes Yes Pain Control: Other Other Other Tissue Debrided: Fibrin/Slough, Fibrin/Slough, Fibrin/Slough, Subcutaneous Subcutaneous Subcutaneous Level: Skin/Subcutaneous Skin/Subcutaneous Skin/Subcutaneous Tissue Tissue Tissue Debridement Area (sq 0.2 0.3 0.96 cm): Instrument: Curette Curette Curette Bleeding: Minimum Minimum Minimum Hemostasis Achieved: Pressure Pressure Pressure Procedural Pain: 0 0 0 Post Procedural Pain: 0 0 0 Debridement Treatment Procedure was tolerated Procedure was tolerated Procedure was tolerated Response: well well well Post Debridement 0.4x0.5x0.2 1x0.3x0.5 1.2x0.8x0.2 Measurements L x W x D (cm) Post Debridement 0.031 0.118 0.151 Volume: (cm) Periwound Skin Texture: Edema: No Edema: No Edema: No Excoriation: No Excoriation: No Excoriation: No Induration: No Induration: No Induration: No Callus: No Callus: No Callus: No Crepitus: No Crepitus: No Crepitus: No Fluctuance: No Fluctuance: No Fluctuance: No Friable: No Friable: No Friable: No Rash: No Rash: No Rash: No Scarring: No Scarring: No Scarring: No Periwound Skin Moist: Yes Moist: Yes Moist: Yes Moisture: Maceration: No Maceration: No Maceration: No Dry/Scaly: No Dry/Scaly: No Dry/Scaly: No Periwound Skin Color: Atrophie Blanche: No Hemosiderin Staining: Yes Hemosiderin Staining: Yes Cyanosis:  No Atrophie Blanche: No Atrophie Blanche: No Ecchymosis: No Cyanosis: No Cyanosis: No Erythema: No Ecchymosis: No Ecchymosis: No Hemosiderin Staining: No Erythema: No Erythema: No Mottled: No Mottled: No Mottled: No SARAHJO, POQUETTE ([id number]) Pallor: No Pallor: No Pallor: No Rubor: No Rubor: No Rubor: No Temperature: No Abnormality No Abnormality No Abnormality Tenderness on Yes Yes Yes Palpation: Wound Preparation: Ulcer Cleansing: Ulcer Cleansing: Ulcer Cleansing: Rinsed/Irrigated with Rinsed/Irrigated with Rinsed/Irrigated with Saline Saline Saline Topical Anesthetic Topical Anesthetic Topical Anesthetic Applied: Other: Lidocaine Applied: Other: Lidocaine Applied: Other: lidocaine 4% Cream 4% Ointment 4% Procedures Performed: Debridement Debridement Debridement Treatment Notes Electronic Signature(s) Signed: 02/16/2015 4:20:29 PM By: Gretta Cool, RN, BSN, Kim RN, BSN Entered By: Gretta Cool, RN, BSN, Kim on 02/15/2015 08:55:40 [name] ([id number]) -------------------------------------------------------------------------------- [place] Details Patient Name: [name] Date of Service: 02/15/2015 8:15 AM Medical Record Number: [id number] Patient Account Number: [id number] Date of Birth/Sex: November 27, 1927 (81 y.o. Female) Treating RN: Cornell Barman Primary Care Physician: Constance Goltz Other Clinician: Referring Physician: Constance Goltz Treating Physician/Extender: Frann Rider in Treatment: 22 Active Inactive Abuse / Safety / Falls / Self Care Management Nursing Diagnoses: Impaired physical mobility Potential for falls Goals: Patient will remain injury free Date Initiated: 09/12/2014 Goal Status: Active Patient/caregiver will verbalize understanding of skin care regimen Date Initiated: 09/12/2014 Goal Status: Active Patient/caregiver will verbalize/demonstrate measures taken to prevent  injury and/or falls Date Initiated: 09/12/2014 Goal  Status: Active Patient/caregiver will verbalize/demonstrate understanding of what to do in case of emergency Date Initiated: 09/12/2014 Goal Status: Active Interventions: Assess fall risk on admission and as needed Assess: immobility, friction, shearing, incontinence upon admission and as needed Assess impairment of mobility on admission and as needed per policy Provide education on basic hygiene Provide education on fall prevention Provide education on personal and home safety Provide education on safe transfers Treatment Activities: Education provided on Basic Hygiene : 10/12/2014 Notes: Necrotic Tissue TRIXY, EVERSON ([id number]) Nursing Diagnoses: Impaired tissue integrity related to necrotic/devitalized tissue Knowledge deficit related to management of necrotic/devitalized tissue Goals: Necrotic/devitalized tissue will be minimized in the wound bed Date Initiated: 09/12/2014 Goal Status: Active Patient/caregiver will verbalize understanding of reason and process for debridement of necrotic tissue Date Initiated: 09/12/2014 Goal Status: Active Interventions: Assess patient pain level pre-, during and post procedure and prior to discharge Provide education on necrotic tissue and debridement process Treatment Activities: Apply topical anesthetic as ordered : 02/15/2015 Enzymatic debridement : 02/15/2015 Excisional debridement : 02/15/2015 Notes: Orientation to the Wound Care Program Nursing Diagnoses: Knowledge deficit related to the wound healing center program Goals: Patient/caregiver will verbalize understanding of the [place] Program Date Initiated: 09/12/2014 Goal Status: Active Interventions: Provide education on orientation to the wound center Notes: Pressure Nursing Diagnoses: Knowledge deficit related to causes and risk factors for pressure ulcer development Knowledge deficit related to management of pressures ulcers Potential for impaired tissue  integrity related to pressure, friction, moisture, and shear GoalsDOVEY, WILLNER ([id number]) Patient will remain free from development of additional pressure ulcers Date Initiated: 09/12/2014 Goal Status: Active Patient will remain free of pressure ulcers Date Initiated: 09/12/2014 Goal Status: Active Patient/caregiver will verbalize risk factors for pressure ulcer development Date Initiated: 09/12/2014 Goal Status: Active Patient/caregiver will verbalize understanding of pressure ulcer management Date Initiated: 09/12/2014 Goal Status: Active Interventions: Assess: immobility, friction, shearing, incontinence upon admission and as needed Assess offloading mechanisms upon admission and as needed Assess potential for pressure ulcer upon admission and as needed Provide education on pressure ulcers Treatment Activities: Pressure reduction/relief device ordered : 02/15/2015 Notes: Wound/Skin Impairment Nursing Diagnoses: Impaired tissue integrity Knowledge deficit related to ulceration/compromised skin integrity Goals: Patient/caregiver will verbalize understanding of skin care regimen Date Initiated: 09/12/2014 Goal Status: Active Ulcer/skin breakdown will heal within 14 weeks Date Initiated: 09/12/2014 Goal Status: Active Interventions: Assess patient/caregiver ability to obtain necessary supplies Assess patient/caregiver ability to perform ulcer/skin care regimen upon admission and as needed Assess ulceration(s) every visit Provide education on ulcer and skin care Treatment Activities: Skin care regimen initiated : 02/15/2015 SHALE, MCGANN ([id number]) Topical wound management initiated : 02/15/2015 Notes: Electronic Signature(s) Signed: 02/16/2015 4:20:29 PM By: Gretta Cool, RN, BSN, Kim RN, BSN Entered By: Gretta Cool, RN, BSN, Kim on 02/15/2015 08:55:33 [place], Ashleah ([id number]) -------------------------------------------------------------------------------- Pain Assessment  Details Patient Name: [name] Date of Service: 02/15/2015 8:15 AM Medical Record Number: [id number] Patient Account Number: [id number] Date of Birth/Sex: 06/04/1928 (81 y.o. Female) Treating RN: Cornell Barman Primary Care Physician: Constance Goltz Other Clinician: Referring Physician: Constance Goltz Treating Physician/Extender: Frann Rider in Treatment: 22 Active Problems Location of Pain Severity and Description of Pain Patient Has Paino Yes Site Locations Pain Location: Generalized Pain Duration of the Pain. Constant / Intermittento Intermittent Rate the pain. Current Pain Level: 5 Worst Pain Level: 6 Pain Management and Medication Current Pain Management: Medication: Yes Goals for Pain Management decrease pain to tolerable level for  patient. Notes Patient states she took pain pill this morning and it has helped. Electronic Signature(s) Signed: 02/16/2015 4:20:29 PM By: Gretta Cool RN, BSN, Kim RN, BSN Entered By: Gretta Cool, RN, BSN, Kim on 02/15/2015 08:10:46 [name] ([id number]) -------------------------------------------------------------------------------- Patient/Caregiver Education Details Patient Name: [name] Date of Service: 02/15/2015 8:15 AM Medical Record Number: [id number] Patient Account Number: [id number] Date of Birth/Gender: 11-Apr-1928 (81 y.o. Female) Treating RN: Cornell Barman Primary Care Physician: Constance Goltz Other Clinician: Referring Physician: Constance Goltz Treating Physician/Extender: Frann Rider in Treatment: 22 Education Assessment Education Provided To: Patient and Caregiver Education Topics Provided Wound/Skin Impairment: Handouts: Caring for Your Ulcer, Other: continue wound care as ordered Electronic Signature(s) Signed: 02/16/2015 4:20:29 PM By: Gretta Cool, RN, BSN, Kim RN, BSN Entered By: Gretta Cool, RN, BSN, Kim on 02/15/2015 08:57:05 [place], Shanine  ([id number]) -------------------------------------------------------------------------------- Wound Assessment Details Patient Name: [name] Date of Service: 02/15/2015 8:15 AM Medical Record Number: [id number] Patient Account Number: [id number] Date of Birth/Sex: 01-18-1928 (81 y.o. Female) Treating RN: Cornell Barman Primary Care Physician: Constance Goltz Other Clinician: Referring Physician: Constance Goltz Treating Physician/Extender: Frann Rider in Treatment: 22 Wound Status Wound Number: 1 Primary Arterial Insufficiency Ulcer Etiology: Wound Location: Left Calcaneous - Lateral Secondary Pressure Ulcer Wounding Event: Pressure Injury Etiology: Date Acquired: 07/24/2014 Wound Open Weeks Of Treatment: 22 Status: Clustered Wound: No Comorbid Glaucoma, Asthma, Hypertension, History: Peripheral Venous Disease, Type II Diabetes Photos Photo Uploaded By: Gretta Cool, RN, BSN, Kim on 02/15/2015 12:28:25 Wound Measurements Length: (cm) 0.4 Width: (cm) 0.5 Depth: (cm) 0.2 Area: (cm) 0.157 Volume: (cm) 0.031 % Reduction in Area: 96.9% % Reduction in Volume: 98.5% Epithelialization: Medium (34-66%) Wound Description Classification: Partial Thickness Foul O Diabetic Severity (Wagner): Grade 1 Wound Margin: Distinct, outline attached Exudate Amount: Small Exudate Type: Serous Exudate Color: amber dor After Cleansing: No Wound Bed Granulation Amount: None Present (0%) Exposed Structure [place], [place] ([id number]) Necrotic Amount: Large (67-100%) Fascia Exposed: No Necrotic Quality: Adherent Slough Fat Layer Exposed: No Tendon Exposed: No Muscle Exposed: No Joint Exposed: No Bone Exposed: No Limited to Skin Breakdown Periwound Skin Texture Texture Color No Abnormalities Noted: No No Abnormalities Noted: No Callus: No Atrophie Blanche: No Crepitus: No Cyanosis: No Excoriation: No Ecchymosis: No Fluctuance: No Erythema: No Friable: No Hemosiderin Staining:  No Induration: No Mottled: No Localized Edema: No Pallor: No Rash: No Rubor: No Scarring: No Temperature / Pain Moisture Temperature: No Abnormality No Abnormalities Noted: No Tenderness on Palpation: Yes Dry / Scaly: No Maceration: No Moist: Yes Wound Preparation Ulcer Cleansing: Rinsed/Irrigated with Saline Topical Anesthetic Applied: Other: Lidocaine 4% Cream, Treatment Notes Wound #1 (Left, Lateral Calcaneous) 1. Cleansed with: Clean wound with Normal Saline 2. Anesthetic Topical Lidocaine 4% cream to wound bed prior to debridement 4. Dressing Applied: Prisma Ag 5. Secondary Dressing Applied Bordered Foam Dressing Notes Epifix for next visit Electronic Signature(s) Signed: 02/16/2015 4:20:29 PM By: Gretta Cool, RN, BSN, Kim RN, BSN Entered By: Gretta Cool, RN, BSN, Kim on 02/15/2015 08:23:41 [place], Ophie ([id number]) [place], Crystale ([id number]) -------------------------------------------------------------------------------- Wound Assessment Details Patient Name: [name] Date of Service: 02/15/2015 8:15 AM Medical Record Number: [id number] Patient Account Number: [id number] Date of Birth/Sex: [birth year] (81 y.o. Female) Treating RN: Cornell Barman Primary Care Physician: Constance Goltz Other Clinician: Referring Physician: Constance Goltz Treating Physician/Extender: Frann Rider in Treatment: 22 Wound Status Wound Number: 5 Primary Arterial Insufficiency Ulcer Etiology: Wound Location: Right Calcaneous Wound Open Wounding Event: Gradually Appeared Status: Date Acquired: 09/26/2014 Comorbid Glaucoma, Asthma, Hypertension, Weeks Of Treatment: 19 History:  Peripheral Venous Disease, Type II Clustered Wound: No Diabetes Photos Photo Uploaded By: Gretta Cool, RN, BSN, Kim on 02/15/2015 12:28:25 Wound Measurements Length: (cm) 1 Width: (cm) 0.3 Depth: (cm) 0.4 Area: (cm) 0.236 Volume: (cm) 0.094 % Reduction in Area: 95.7% % Reduction in Volume:  82.9% Epithelialization: Medium (34-66%) Wound Description Full Thickness Without Exposed Foul Odor A Classification: Support Structures Diabetic Severity Grade 1 (Wagner): Wound Margin: Distinct, outline attached Exudate Amount: Small Exudate Type: Serosanguineous Exudate Color: red, brown fter Cleansing: No Wound Bed Granulation Amount: Large (67-100%) Exposed Structure [place], Maisha ([id number]) Granulation Quality: Pink Fascia Exposed: No Necrotic Amount: Small (1-33%) Fat Layer Exposed: No Necrotic Quality: Adherent Slough Tendon Exposed: No Muscle Exposed: No Joint Exposed: No Bone Exposed: No Limited to Skin Breakdown Periwound Skin Texture Texture Color No Abnormalities Noted: No No Abnormalities Noted: No Callus: No Atrophie Blanche: No Crepitus: No Cyanosis: No Excoriation: No Ecchymosis: No Fluctuance: No Erythema: No Friable: No Hemosiderin Staining: Yes Induration: No Mottled: No Localized Edema: No Pallor: No Rash: No Rubor: No Scarring: No Temperature / Pain Moisture Temperature: No Abnormality No Abnormalities Noted: No Tenderness on Palpation: Yes Dry / Scaly: No Maceration: No Moist: Yes Wound Preparation Ulcer Cleansing: Rinsed/Irrigated with Saline Topical Anesthetic Applied: Other: Lidocaine 4% Ointment, Treatment Notes Wound #5 (Right Calcaneous) 1. Cleansed with: Clean wound with Normal Saline 2. Anesthetic Topical Lidocaine 4% cream to wound bed prior to debridement 4. Dressing Applied: Prisma Ag 5. Secondary Dressing Applied Bordered Foam Dressing Notes Epifix for next visit Electronic Signature(s) Signed: 02/16/2015 4:20:29 PM By: Gretta Cool, RN, BSN, Kim RN, BSN Entered By: Gretta Cool, RN, BSN, Kim on 02/15/2015 08:24:08 YANELY, VALLADOLID ([id number]) [place], Debria ([id number]) -------------------------------------------------------------------------------- Wound Assessment Details Patient Name: [name] Date of  Service: 02/15/2015 8:15 AM Medical Record Number: [id number] Patient Account Number: [id number] Date of Birth/Sex: [birth year] (81 y.o. Female) Treating RN: Cornell Barman Primary Care Physician: Constance Goltz Other Clinician: Referring Physician: Constance Goltz Treating Physician/Extender: Frann Rider in Treatment: 22 Wound Status Wound Number: 6 Primary Arterial Insufficiency Ulcer Etiology: Wound Location: Left Achilles Wound Open Wounding Event: Gradually Appeared Status: Date Acquired: 09/26/2014 Comorbid Glaucoma, Asthma, Hypertension, Weeks Of Treatment: 19 History: Peripheral Venous Disease, Type II Clustered Wound: No Diabetes Photos Photo Uploaded By: Gretta Cool, RN, BSN, Kim on 02/15/2015 12:28:40 Wound Measurements Length: (cm) 1.2 Width: (cm) 0.8 Depth: (cm) 0.1 Area: (cm) 0.754 Volume: (cm) 0.075 % Reduction in Area: 80% % Reduction in Volume: 96% Epithelialization: Medium (34-66%) Wound Description Classification: Partial Thickness Foul O Diabetic Severity (Wagner): Grade 1 Wound Margin: Distinct, outline attached Exudate Amount: Small Exudate Type: Serous Exudate Color: amber dor After Cleansing: No Wound Bed Granulation Amount: Medium (34-66%) Exposed Structure Granulation Quality: Pink Fascia Exposed: No Necrotic Amount: Medium (34-66%) Fat Layer Exposed: No [place], [place] ([id number]) Necrotic Quality: Adherent Slough Tendon Exposed: No Muscle Exposed: No Joint Exposed: No Bone Exposed: No Limited to Skin Breakdown Periwound Skin Texture Texture Color No Abnormalities Noted: No No Abnormalities Noted: No Callus: No Atrophie Blanche: No Crepitus: No Cyanosis: No Excoriation: No Ecchymosis: No Fluctuance: No Erythema: No Friable: No Hemosiderin Staining: Yes Induration: No Mottled: No Localized Edema: No Pallor: No Rash: No Rubor: No Scarring: No Temperature / Pain Moisture Temperature: No Abnormality No Abnormalities Noted:  No Tenderness on Palpation: Yes Dry / Scaly: No Maceration: No Moist: Yes Wound Preparation Ulcer Cleansing: Rinsed/Irrigated with Saline Topical Anesthetic Applied: Other: lidocaine 4%, Treatment Notes Wound #6 (Left Achilles) 1. Cleansed with: Clean wound with Normal  Saline 2. Anesthetic Topical Lidocaine 4% cream to wound bed prior to debridement 4. Dressing Applied: Prisma Ag 5. Secondary Dressing Applied Bordered Foam Dressing Notes Epifix for next visit Electronic Signature(s) Signed: 02/16/2015 4:20:29 PM By: Gretta Cool, RN, BSN, Kim RN, BSN Entered By: Gretta Cool, RN, BSN, Kim on 02/15/2015 08:24:35 [name] ([id number]) -------------------------------------------------------------------------------- [place] Details Patient Name: [name] Date of Service: 02/15/2015 8:15 AM Medical Record Number: [id number] Patient Account Number: [id number] Date of Birth/Sex: 16-Aug-1928 (81 y.o. Female) Treating RN: Cornell Barman Primary Care Physician: Constance Goltz Other Clinician: Referring Physician: Constance Goltz Treating Physician/Extender: Frann Rider in Treatment: 22 Vital Signs Time Taken: 08:10 Temperature (F): 97.9 Height (in): 64 Pulse (bpm): 68 Weight (lbs): 180 Respiratory Rate (breaths/min): 18 Body Mass Index (BMI): 30.9 Blood Pressure (mmHg): 143/52 Reference Range: 80 - 120 mg / dl Electronic Signature(s) Signed: 02/16/2015 4:20:29 PM By: Gretta Cool, RN, BSN, Kim RN, BSN Entered By: Gretta Cool, RN, BSN, Kim on 02/15/2015 08:11:14

## 2015-02-22 ENCOUNTER — Encounter: Payer: Medicare Other | Attending: Surgery | Admitting: Surgery

## 2015-02-22 DIAGNOSIS — L97421 Non-pressure chronic ulcer of left heel and midfoot limited to breakdown of skin: Secondary | ICD-10-CM | POA: Diagnosis not present

## 2015-02-22 DIAGNOSIS — L97411 Non-pressure chronic ulcer of right heel and midfoot limited to breakdown of skin: Secondary | ICD-10-CM | POA: Insufficient documentation

## 2015-02-22 DIAGNOSIS — I70235 Atherosclerosis of native arteries of right leg with ulceration of other part of foot: Secondary | ICD-10-CM | POA: Insufficient documentation

## 2015-02-22 DIAGNOSIS — X58XXXA Exposure to other specified factors, initial encounter: Secondary | ICD-10-CM | POA: Insufficient documentation

## 2015-02-22 DIAGNOSIS — I70244 Atherosclerosis of native arteries of left leg with ulceration of heel and midfoot: Secondary | ICD-10-CM | POA: Insufficient documentation

## 2015-02-22 DIAGNOSIS — S91104A Unspecified open wound of right lesser toe(s) without damage to nail, initial encounter: Secondary | ICD-10-CM | POA: Diagnosis not present

## 2015-02-23 NOTE — Progress Notes (Signed)
[name], [name] ([id number]) Visit Report for 02/22/2015 Chief Complaint Document Details Patient Name: [name], [name] Date of Service: 02/22/2015 8:15 AM Medical Record Number: [id number] Patient Account Number: [id number] Date of Birth/Sex: 1928-06-27 (81 y.o. Female) Treating RN: Primary Care Physician: Constance Goltz Other Clinician: Referring Physician: Constance Goltz Treating Physician/Extender: Frann Rider in Treatment: 46 Information Obtained from: Patient Chief Complaint Patient presents to the wound care center today with an open arterial ulcer on the left foot big toe and lateral heal. She also has some superficial ulcerations on the second and third toe dorsum. she has no family member with her today and she is a very poor historian and cannot give a proper history. As stated that she walks around with a walker. Electronic Signature(s) Signed: 02/22/2015 9:01:52 AM By: Christin Fudge MD, FACS Entered By: Christin Fudge on 02/22/2015 09:01:52 [name] ([id number]) -------------------------------------------------------------------------------- HPI Details Patient Name: [name] Date of Service: 02/22/2015 8:15 AM Medical Record Number: [id number] Patient Account Number: [id number] Date of Birth/Sex: July 03, 1928 (81 y.o. Female) Treating RN: Primary Care Physician: Constance Goltz Other Clinician: Referring Physician: Constance Goltz Treating Physician/Extender: Frann Rider in Treatment: 23 History of Present Illness Location: left heel Quality: Patient reports experiencing a dull pain to affected area(s). Severity: Patient states wound are getting worse. Duration: Patient states that they are not certain how long the wound has been present. Timing: Pain in wound is Intermittent (comes and goes Context: The wound appeared gradually over time Associated Signs and Symptoms: Patient reports having difficulty standing for long periods. HPI Description: This  81 year old patient is a poor historian and comes from a nursing home. Does not have any family members with her. Says she has a ulcer on the lateral part of her left foot and some new ones are there on her right foot too. She is unable to say how long she's had these. she does walk with a walker and this is limited most of the day. Does not recall if she has had any vascular studies done. I have reviewed an x-ray done of the left foot which basically shows osteoporosis but no evidence of fracture dislocation or osteomyelitis. 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 She seems to be doing better overall. 10/19/14 -- I understand she has gone to the vascular surgery and lab yesterday and has had all her tests done yesterday. She is doing fine otherwise and has had no fresh issues. 10/26/2014 she seems to be doing well and has no fresh problems and seems much brighter. 10/12/14 -- Taking her antibiotic and continues to do her dressings locally and she is helped by her skilled nursing. She seems to be doing fine and has no fresh issues. 10/19/14 -- she is doing fine and has had no fresh issues. She says her son to go for her vascular workup yesterday and she has been called back after 3 months. we will try and gather these reports to review what they said. 10/26/14 --I have been able to review notes from the vascular surgery office and these were dated from 10/18/2014. He patient was on her first postop visit and had Doppler ultrasounds done of her arteries. She had more than 50% stenosis of the right superficial femoral artery and more than 50% stenosis in the left tibioperoneal trunk. She was status post a  left lower extremity angiogram with angioplasty of the peroneal and left superficial femoral arteries for a nonhealing left  foot and ankle ulceration. note is made of the fact that the ABIs had not changed in the last month since her previous visit. The opinion of her provider was that she did not need any intervention at this time and they had done everything they could at the present time. They would continue on Plavix and antibiotics and see her back in 3 months for a another arterial duplex study of the lower extremities. [name], [name] ([id number]) 11/02/2014 -- she has spoken to her caregivers about coming for HBO 5 times per week for 6-8 weeks and they are agreeable about this and she is willing to undergo hyperbaric oxygen therapy. 11/16/2014 she has been worked up with a EKG and chest x-ray and these were within normal limits. As far as the investigations go she is ready for HBOT. we are awaiting some insurance clearance so that she can start on her hyperbaric oxygen therapy. 11/30/2014 she tolerated hyperbaric oxygen therapy fine and there were no issues with her blood glucose levels. As noted she is not a diabetic and most of her fingerstick blood glucose levels are inaccurate because of the Raynaud's phenomena. She normally has to have your lobe sticks to get any random glucose levels. I have also requested her primary care does a venous blood glucose draw to check her glucose level. 12/21/2014 -- she is tolerating hyperbaric oxygen very well and has overall made a lot of progress. Her left leg is looking much better. 01/11/2015 - No new complaints today. New ulceration on the dorsum of the right second toe noted today on physical exam. Patient unaware. No significant pain. No fever or chills. No significant drainage. Offloading with sage boots at night. Significantly improved with HBO. 01/19/2015 - the patient has had a x-ray of the right foot done at the nursing home but we still do not have the report and we are awaiting this result. She is otherwise doing fine with her general health. 01/25/2015 --  X-ray of her right foot showed no acute fracture, dislocation, bony infection or osteomyelitis. The was some soft tissue swelling and osteopenia seen. 02/15/2015 -- due to some insurance issues we could not get her epifix and we are working with her company to get her this. She is doing fine otherwise and has no fresh issues. Electronic Signature(s) Signed: 02/22/2015 9:01:57 AM By: Christin Fudge MD, FACS Entered By: Christin Fudge on 02/22/2015 09:01:56 [name] ([id number]) -------------------------------------------------------------------------------- Physical Exam Details Patient Name: [name] Date of Service: 02/22/2015 8:15 AM Medical Record Number: [id number] Patient Account Number: [id number] Date of Birth/Sex: 1928/06/22 (81 y.o. Female) Treating RN: Primary Care Physician: Constance Goltz Other Clinician: Referring Physician: Constance Goltz Treating Physician/Extender: Frann Rider in Treatment: 23 Constitutional . Pulse regular. Respirations normal and unlabored. Afebrile. . Eyes Nonicteric. Reactive to light. Ears, Nose, Mouth, and Throat Lips, teeth, and gums WNL.Marland Kitchen Moist mucosa without lesions . Neck supple and nontender. No palpable supraclavicular or cervical adenopathy. Normal sized without goiter. Respiratory WNL. No retractions.. Cardiovascular Pedal Pulses WNL. No clubbing, cyanosis or edema. Musculoskeletal Adexa without tenderness or enlargement.. Digits and nails w/o clubbing, cyanosis, infection, petechiae, ischemia, or inflammatory conditions.. Integumentary (Hair, Skin) No suspicious lesions. No crepitus or fluctuance. No peri-wound warmth or erythema. No masses.Marland Kitchen Psychiatric Judgement and insight Intact.. No evidence of depression, anxiety, or agitation.. Notes the wounds on both lower extremities look very clean and  there is healthy granulation tissue. Electronic Signature(s) Signed: 02/22/2015 9:02:24 AM By: Christin Fudge MD,  FACS Entered By: Christin Fudge on 02/22/2015 09:02:24 [name] ([id number]) -------------------------------------------------------------------------------- Physician Orders Details Patient Name: [name] Date of Service: 02/22/2015 8:15 AM Medical Record Number: [id number] Patient Account Number: [id number] Date of Birth/Sex: 18-Aug-1928 (81 y.o. Female) Treating RN: Afful, RN, BSN, [place] Primary Care Physician: Constance Goltz Other Clinician: Referring Physician: Constance Goltz Treating Physician/Extender: Frann Rider in Treatment: 30 Verbal / Phone Orders: Yes Clinician: Afful, RN, BSN, Rita Read Back and Verified: Yes Diagnosis Coding Wound Cleansing Wound #1 Left,Lateral Calcaneous o Clean wound with Normal Saline. - be sure to clean out all remaining prisma Wound #5 Right Calcaneous o Clean wound with Normal Saline. - be sure to clean out all remaining prisma Wound #6 Left Achilles o Clean wound with Normal Saline. - be sure to clean out all remaining prisma Anesthetic Wound #1 Left,Lateral Calcaneous o Topical Lidocaine 4% cream applied to wound bed prior to debridement Wound #5 Right Calcaneous o Topical Lidocaine 4% cream applied to wound bed prior to debridement Wound #6 Left Achilles o Topical Lidocaine 4% cream applied to wound bed prior to debridement Primary Wound Dressing Wound #1 Left,Lateral Calcaneous o Prisma Ag - or collagen with silver equivalent Wound #5 Right Calcaneous o Prisma Ag - or collagen with silver equivalent Wound #6 Left Achilles o Prisma Ag - or collagen with silver equivalent Secondary Dressing Wound #1 Left,Lateral Calcaneous o Boardered Foam Dressing Wound #5 Right Calcaneous o Boardered Foam Dressing [place], Tejah ([id number]) Wound #6 Left Achilles o Boardered Foam Dressing Dressing Change Frequency Wound #1 Left,Lateral Calcaneous o Change dressing every other day. Wound #5 Right  Calcaneous o Change dressing every other day. Wound #6 Left Achilles o Change dressing every other day. Follow-up Appointments Wound #1 Left,Lateral Calcaneous o Return Appointment in 1 week. Wound #5 Right Calcaneous o Return Appointment in 1 week. Wound #6 Left Achilles o Return Appointment in 1 week. Off-Loading Wound #1 Left,Lateral Calcaneous o Other: - SAGE boots while sitting or lying. Do not wear while ambulating. Wound #5 Right Calcaneous o Other: - SAGE boots while sitting or lying. Do not wear while ambulating. Wound #6 Left Achilles o Other: - SAGE boots while sitting or lying. Do not wear while ambulating. Home Health Wound #1 [place] [facility] - [place] Nurse may visit PRN to address patientos wound care needs. o FACE TO FACE ENCOUNTER: MEDICARE and MEDICAID PATIENTS: I certify that this patient is under my care and that I had a face-to-face encounter that meets the physician face-to-face encounter requirements with this patient on this date. The encounter with the patient was in whole or in part for the following MEDICAL CONDITION: (primary reason for [place]) MEDICAL NECESSITY: I certify, that based on my findings, NURSING services are a medically necessary home health service. HOME BOUND STATUS: I certify that my clinical findings support that this patient is homebound (i.e., Due to illness or injury, pt requires aid of supportive devices such as crutches, cane, wheelchairs, walkers, the use of special transportation or the assistance of another person to leave their place of residence. There is a normal inability to leave the home and doing so requires considerable and taxing effort. [name], [name] ([id number]) absences are for medical reasons / religious services and are infrequent or of short duration when for other reasons). o If current dressing causes regression in wound  condition, may D/C ordered dressing product/s and apply Normal Saline Moist Dressing daily until next [place] / Other MD appointment. [place] of regression in wound condition at [phone]. o Please direct any NON-WOUND related issues/requests for orders to patient's Primary Care Physician Wound #5 [place] [facility] - [place] Nurse may visit PRN to address patientos wound care needs. o FACE TO FACE ENCOUNTER: MEDICARE and MEDICAID PATIENTS: I certify that this patient is under my care and that I had a face-to-face encounter that meets the physician face-to-face encounter requirements with this patient on this date. The encounter with the patient was in whole or in part for the following MEDICAL CONDITION: (primary reason for [place]) MEDICAL NECESSITY: I certify, that based on my findings, NURSING services are a medically necessary home health service. HOME BOUND STATUS: I certify that my clinical findings support that this patient is homebound (i.e., Due to illness or injury, pt requires aid of supportive devices such as crutches, cane, wheelchairs, walkers, the use of special transportation or the assistance of another person to leave their place of residence. There is a normal inability to leave the home and doing so requires considerable and taxing effort. Other absences are for medical reasons / religious services and are infrequent or of short duration when for other reasons). o If current dressing causes regression in wound condition, may D/C ordered dressing product/s and apply Normal Saline Moist Dressing daily until next [place] / Other MD appointment. [place] of regression in wound condition at [phone]. o Please direct any NON-WOUND related issues/requests for orders to patient's Primary Care Physician Wound #6 Left Achilles o [place] [facility] - [place] Nurse may visit PRN to address patientos wound care needs. o FACE TO FACE ENCOUNTER: MEDICARE and MEDICAID PATIENTS: I certify that this patient is under my care and that I had a face-to-face encounter that meets the physician face-to-face encounter requirements with this patient on this date. The encounter with the patient was in whole or in part for the following MEDICAL CONDITION: (primary reason for [place]) MEDICAL NECESSITY: I certify, that based on my findings, NURSING services are a medically necessary home health service. HOME BOUND STATUS: I certify that my clinical findings support that this patient is homebound (i.e., Due to illness or injury, pt requires aid of supportive devices such as crutches, cane, wheelchairs, walkers, the use of special transportation or the assistance of another person to leave their place of residence. There is a normal inability to leave the home and doing so requires considerable and taxing effort. Other absences are for medical reasons / religious services and are infrequent or of short duration when for other reasons). o If current dressing causes regression in wound condition, may D/C ordered dressing product/s and apply Normal Saline Moist Dressing daily until next [place] / Other MD appointment. [place] of regression in wound condition at [phone]. o Please direct any NON-WOUND related issues/requests for orders to patient's Primary Care Physician [place], C[place] ([id number]) Notes follow up with Epifix. order for next visit Electronic Signature(s) Signed: 02/22/2015 12:23:30 PM By: Christin Fudge MD, FACS Signed: 02/22/2015 4:18:25 PM By: Regan Lemming BSN, RN Entered By: Regan Lemming on 02/22/2015 08:35:46 [name] ([id number]) -------------------------------------------------------------------------------- Problem List Details Patient Name: [name] Date of Service: 02/22/2015 8:15 AM Medical Record Number: [id number] Patient Account Number: [id number] Date  of Birth/Sex: 01-07-1928 (82 y.o. 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/26/2014 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 Female) Treating RN: Primary Care Physician: Constance Goltz Other Clinician: Referring Physician: Constance Goltz Treating Physician/Extender: Frann Rider in Treatment: 84 Subjective Chief Complaint Information obtained from Patient Patient presents to the wound care center today with an open arterial ulcer on the left foot big toe and lateral heal. She also has some superficial ulcerations on the second and third toe dorsum. she has no family member with her today and she is a very poor historian and cannot give  a proper history. As stated that she walks around with a walker. History of Present Illness (HPI) The following HPI elements were documented for the patient's wound: Location: left heel Quality: Patient reports experiencing a dull pain to affected area(s). Severity: Patient states wound are getting worse. Duration: Patient states that they are not certain how long the wound has been present. Timing: Pain in wound is Intermittent (comes and goes Context: The wound appeared gradually over time Associated Signs and Symptoms: Patient reports having difficulty standing for long periods. This 81 year old patient is a poor historian and comes from a nursing home. Does not have any family members with her. Says she has a ulcer on the lateral part of her left foot and some new ones are there on her right foot too. She is unable to say how long she's had these. she does walk with a walker and this is limited most of the day. Does not recall if she has had any vascular studies done. I have reviewed an x-ray done of the left foot which basically shows osteoporosis but no evidence of fracture dislocation or osteomyelitis. 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 She seems to be doing better overall. 10/19/14 -- I understand she has gone to the vascular surgery and lab yesterday and has had all her tests done yesterday. She is doing fine otherwise and has had no fresh issues. 10/26/2014 she seems to be doing well and has no fresh problems and seems much brighter. 10/12/14 -- Taking her antibiotic and continues to do her dressings locally and she is helped by her skilled nursing. She seems to be doing fine and has no fresh issues. 10/19/14 -- she is doing fine and has had no fresh issues. She says her son to go for her vascular  workup yesterday and she has been called back after 3 months.  we will try and gather these reports to review what [name], [name] ([id number]) they said. 10/26/14 --I have been able to review notes from the vascular surgery office and these were dated from 10/18/2014. He patient was on her first postop visit and had Doppler ultrasounds done of her arteries. She had more than 50% stenosis of the right superficial femoral artery and more than 50% stenosis in the left tibioperoneal trunk. She was status post a left lower extremity angiogram with angioplasty of the peroneal and left superficial femoral arteries for a nonhealing left foot and ankle ulceration. note is made of the fact that the ABIs had not changed in the last month since her previous visit. The opinion of her provider was that she did not need any intervention at this time and they had done everything they could at the present time. They would continue on Plavix and antibiotics and see her back in 3 months for a another arterial duplex study of the lower extremities. 11/02/2014 -- she has spoken to her caregivers about coming for HBO 5 times per week for 6-8 weeks and they are agreeable about this and she is willing to undergo hyperbaric oxygen therapy. 11/16/2014 she has been worked up with a EKG and chest x-ray and these were within normal limits. As far as the investigations go she is ready for HBOT. we are awaiting some insurance clearance so that she can start on her hyperbaric oxygen therapy. 11/30/2014 she tolerated hyperbaric oxygen therapy fine and there were no issues with her blood glucose levels. As noted she is not a diabetic and most of her fingerstick blood glucose levels are inaccurate because of the Raynaud's phenomena. She normally has to have your lobe sticks to get any random glucose levels. I have also requested her primary care does a venous blood glucose draw to check her glucose level. 12/21/2014 -- she is  tolerating hyperbaric oxygen very well and has overall made a lot of progress. Her left leg is looking much better. 01/11/2015 - No new complaints today. New ulceration on the dorsum of the right second toe noted today on physical exam. Patient unaware. No significant pain. No fever or chills. No significant drainage. Offloading with sage boots at night. Significantly improved with HBO. 01/19/2015 - the patient has had a x-ray of the right foot done at the nursing home but we still do not have the report and we are awaiting this result. She is otherwise doing fine with her general health. 01/25/2015 -- X-ray of her right foot showed no acute fracture, dislocation, bony infection or osteomyelitis. The was some soft tissue swelling and osteopenia seen. 02/15/2015 -- due to some insurance issues we could not get her epifix and we are working with her company to get her this. She is doing fine otherwise and has no fresh issues. Objective Constitutional [name], [name] ([id number]) Pulse regular. Respirations normal and unlabored. Afebrile. Vitals Time Taken: 8:23 AM, Height: 64 in, Weight: 180 lbs, BMI: 30.9, Temperature: 97.8 F, Pulse: 72 bpm, Respiratory Rate: 18 breaths/min, Blood Pressure: 130/51 mmHg. Eyes Nonicteric. Reactive to light. Ears, Nose, Mouth, and Throat Lips, teeth, and gums WNL.Marland Kitchen Moist mucosa without lesions . Neck supple and nontender. No palpable supraclavicular or cervical adenopathy. Normal sized without goiter. Respiratory WNL. No retractions.. Cardiovascular Pedal Pulses WNL. No clubbing, cyanosis or edema. Musculoskeletal Adexa without tenderness or enlargement.. Digits and nails w/o clubbing, cyanosis, infection, petechiae, ischemia, or inflammatory conditions.Marland Kitchen Psychiatric Judgement and insight Intact.. No evidence of depression,  anxiety, or agitation.. General Notes: the wounds on both lower extremities look very clean and there is healthy  granulation tissue. Integumentary (Hair, Skin) No suspicious lesions. No crepitus or fluctuance. No peri-wound warmth or erythema. No masses.. Wound #1 status is Open. Original cause of wound was Pressure Injury. The wound is located on the Left,Lateral Calcaneous. The wound measures 0.3cm length x 0.3cm width x 0.2cm depth; 0.071cm^2 area and 0.014cm^3 volume. The wound is limited to skin breakdown. There is no tunneling or undermining noted. There is a small amount of serous drainage noted. The wound margin is distinct with the outline attached to the wound base. There is medium (34-66%) granulation within the wound bed. There is a small (1-33%) amount of necrotic tissue within the wound bed including Adherent Slough. The periwound skin appearance exhibited: Localized Edema, Moist. The periwound skin appearance did not exhibit: Callus, Crepitus, Excoriation, Fluctuance, Friable, Induration, Rash, Scarring, Dry/Scaly, Maceration, Atrophie Blanche, Cyanosis, Ecchymosis, Hemosiderin Staining, Mottled, Pallor, Rubor, Erythema. Periwound temperature was noted as No Abnormality. The periwound has tenderness on palpation. Wound #5 status is Open. Original cause of wound was Gradually Appeared. The wound is located on the Right Calcaneous. The wound measures 1cm length x 0.5cm width x 0.3cm depth; 0.393cm^2 area and 0.118cm^3 volume. The wound is limited to skin breakdown. There is no undermining noted. There is a small amount of serosanguineous drainage noted. The wound margin is distinct with the outline attached to the wound base. There is medium (34-66%) pink granulation within the wound bed. There is a small [name], [name] ([id number]) (1-33%) amount of necrotic tissue within the wound bed including Adherent Slough. The periwound skin appearance exhibited: Moist, Hemosiderin Staining. The periwound skin appearance did not exhibit: Callus, Crepitus, Excoriation, Fluctuance, Friable, Induration,  Localized Edema, Rash, Scarring, Dry/Scaly, Maceration, Atrophie Blanche, Cyanosis, Ecchymosis, Mottled, Pallor, Rubor, Erythema. Periwound temperature was noted as No Abnormality. The periwound has tenderness on palpation. Wound #6 status is Open. Original cause of wound was Gradually Appeared. The wound is located on the Left Achilles. The wound measures 1cm length x 0.5cm width x 0.1cm depth; 0.393cm^2 area and 0.039cm^3 volume. The wound is limited to skin breakdown. There is no tunneling or undermining noted. There is a small amount of serous drainage noted. The wound margin is distinct with the outline attached to the wound base. There is medium (34-66%) pink granulation within the wound bed. There is no necrotic tissue within the wound bed. The periwound skin appearance exhibited: Localized Edema, Moist, Hemosiderin Staining. The periwound skin appearance did not exhibit: Callus, Crepitus, Excoriation, Fluctuance, Friable, Induration, Rash, Scarring, Dry/Scaly, Maceration, Atrophie Blanche, Cyanosis, Ecchymosis, Mottled, Pallor, Rubor, Erythema. Periwound temperature was noted as No Abnormality. The periwound has tenderness on palpation. 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 She'll come back and see me next week. Plan Wound Cleansing: Wound #1 Left,Lateral Calcaneous: Clean  wound with Normal Saline. - be sure to clean  out all remaining prisma Wound #5 Right Calcaneous: Clean wound with Normal Saline. - be sure to clean out all remaining prisma Wound #6 Left Achilles: [name], [name] ([id number]) Clean wound with Normal Saline. - be sure to clean out all remaining prisma Anesthetic: Wound #1 Left,Lateral Calcaneous: Topical Lidocaine 4% cream applied to wound bed prior to debridement Wound #5 Right Calcaneous: Topical Lidocaine 4% cream applied to wound bed prior to debridement Wound #6 Left Achilles: Topical Lidocaine 4% cream applied to wound bed prior to debridement Primary Wound Dressing: Wound #1 Left,Lateral Calcaneous: Prisma Ag - or collagen with silver equivalent Wound #5 Right Calcaneous: Prisma Ag - or collagen with silver equivalent Wound #6 Left Achilles: Prisma Ag - or collagen with silver equivalent Secondary Dressing: Wound #1 Left,Lateral Calcaneous: Boardered Foam Dressing Wound #5 Right Calcaneous: Boardered Foam Dressing Wound #6 Left Achilles: Boardered Foam Dressing Dressing Change Frequency: Wound #1 Left,Lateral Calcaneous: Change dressing every other day. Wound #5 Right Calcaneous: Change dressing every other day. Wound #6 Left Achilles: Change dressing every other day. Follow-up Appointments: Wound #1 Left,Lateral Calcaneous: Return Appointment in 1 week. Wound #5 Right Calcaneous: Return Appointment in 1 week. Wound #6 Left Achilles: Return Appointment in 1 week. Off-Loading: Wound #1 Left,Lateral Calcaneous: Other: - SAGE boots while sitting or lying. Do not wear while ambulating. Wound #5 Right Calcaneous: Other: - SAGE boots while sitting or lying. Do not wear while ambulating. Wound #6 Left Achilles: Other: - SAGE boots while sitting or lying. Do not wear while ambulating. Home Health: Wound #1 Left,Lateral Calcaneous: Continue Home Health Visits - [facility] Nurse may visit PRN to address patient  s wound care needs. FACE TO FACE ENCOUNTER: MEDICARE and MEDICAID PATIENTS: I certify that this patient is under my care and that I had a face-to-face encounter that meets the physician face-to-face encounter requirements with this patient on this date. The encounter with the patient was in whole or in part for the following MEDICAL CONDITION: (primary reason for [place]) MEDICAL NECESSITY: I certify, [name], [name] ([id number]) that based on my findings, NURSING services are a medically necessary home health service. HOME BOUND STATUS: I certify that my clinical findings support that this patient is homebound (i.e., Due to illness or injury, pt requires aid of supportive devices such as crutches, cane, wheelchairs, walkers, the use of special transportation or the assistance of another person to leave their place of residence. There is a normal inability to leave the home and doing so requires considerable and taxing effort. Other absences are for medical reasons / religious services and are infrequent or of short duration when for other reasons). If current dressing causes regression in wound condition, may D/C ordered dressing product/s and apply Normal Saline Moist Dressing daily until next [place] / Other MD appointment. [place] of regression in wound condition at [phone]. Please direct any NON-WOUND related issues/requests for orders to patient's Primary Care Physician Wound #5 Right Calcaneous: [place] Visits - [facility] Nurse may visit PRN to address patient s wound care needs. FACE TO FACE ENCOUNTER: MEDICARE and MEDICAID PATIENTS: I certify that this patient is under my care and that I had a face-to-face encounter that meets the physician face-to-face encounter requirements with this patient on this date. The encounter with the patient was in whole or in part for the following MEDICAL CONDITION: (primary reason for  [place]) MEDICAL NECESSITY: I certify, that based on my findings, NURSING services  are a medically necessary home health service. HOME BOUND STATUS: I certify that my clinical findings support that this patient is homebound (i.e., Due to illness or injury, pt requires aid of supportive devices such as crutches, cane, wheelchairs, walkers, the use of special transportation or the assistance of another person to leave their place of residence. There is a normal inability to leave the home and doing so requires considerable and taxing effort. Other absences are for medical reasons / religious services and are infrequent or of short duration when for other reasons). If current dressing causes regression in wound condition, may D/C ordered dressing product/s and apply Normal Saline Moist Dressing daily until next [place] / Other MD appointment. [place] of regression in wound condition at [phone]. Please direct any NON-WOUND related issues/requests for orders to patient's Primary Care Physician Wound #6 Left Achilles: [place] Visits - [facility] Nurse may visit PRN to address patient s wound care needs. FACE TO FACE ENCOUNTER: MEDICARE and MEDICAID PATIENTS: I certify that this patient is under my care and that I had a face-to-face encounter that meets the physician face-to-face encounter requirements with this patient on this date. The encounter with the patient was in whole or in part for the following MEDICAL CONDITION: (primary reason for [place]) MEDICAL NECESSITY: I certify, that based on my findings, NURSING services are a medically necessary home health service. HOME BOUND STATUS: I certify that my clinical findings support that this patient is homebound (i.e., Due to illness or injury, pt requires aid of supportive devices such as crutches, cane, wheelchairs, walkers, the use of special transportation or the  assistance of another person to leave their place of residence. There is a normal inability to leave the home and doing so requires considerable and taxing effort. Other absences are for medical reasons / religious services and are infrequent or of short duration when for other reasons). If current dressing causes regression in wound condition, may D/C ordered dressing product/s and apply Normal Saline Moist Dressing daily until next [place] / Other MD appointment. [place] of regression in wound condition at [phone]. Please direct any NON-WOUND related issues/requests for orders to patient's Primary Care Physician General Notes: follow up with Epifix. order for next visit [name], [name] ([id number]) she did not need any sharp debridement this week and will continue with the Prisma AG and await the insurance clarification regarding her epifix skin substitute. She'll come back and see me next week. Electronic Signature(s) Signed: 02/22/2015 9:03:14 AM By: Christin Fudge MD, FACS Entered By: Christin Fudge on 02/22/2015 09:03:14 [name] ([id number]) -------------------------------------------------------------------------------- SuperBill Details Patient Name: [name] Date of Service: 02/22/2015 Medical Record Number: [id number] Patient Account Number: [id number] Date of Birth/Sex: October 18, 1927 (81 y.o. 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 I70.235 Atherosclerosis of native arteries of right leg with ulceration of other part of foot L97.412 Non-pressure chronic ulcer of right heel and  midfoot with fat layer exposed S91.104A Unspecified open wound of right lesser toe(s) without damage to nail, initial encounter Physician Procedures CPT4: Description Modifier Quantity Code [id number] - WC PHYS LEVEL 3 - EST PT 1 ICD-10 Description Diagnosis L97.422  Non-pressure chronic ulcer of left heel and midfoot with fat layer exposed I70.244 Atherosclerosis of native arteries of left leg  with ulceration of heel and midfoot 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 Electronic Signature(s) Signed: 02/22/2015 9:03:32 AM By: Christin Fudge MD, FACS Entered By: Christin Fudge on 02/22/2015 09:03:32

## 2015-02-23 NOTE — Progress Notes (Addendum)
[name], [name] ([id number]) Visit Report for 02/22/2015 Arrival Information Details Patient Name: [name] Date of Service: 02/22/2015 8:15 AM Medical Record Number: [id number] Patient Account Number: [id number] Date of Birth/Sex: 1927/09/11 (81 y.o. Female) Treating RN: Afful, RN, BSN, Velva Harman Primary Care Physician: Constance Goltz Other Clinician: Referring Physician: Constance Goltz Treating Physician/Extender: Frann Rider in Treatment: 23 Visit Information History Since Last Visit Any new allergies or adverse reactions: No Patient Arrived: Wheel Chair Had a fall or experienced change in No Arrival Time: 08:22 activities of daily living that may affect Accompanied By: self risk of falls: Transfer Assistance: None Signs or symptoms of abuse/neglect since last No Patient Identification Verified: Yes visito Secondary Verification Process Yes Hospitalized since last visit: No Completed: Pain Present Now: No Patient Requires Transmission- No Based Precautions: Patient Has Alerts: Yes Patient Alerts: Patient on Blood Thinner DMII Plavix ABI L 0.63 R 0.79 Electronic Signature(s) Signed: 02/22/2015 4:18:25 PM By: Regan Lemming BSN, RN Entered By: Regan Lemming on 02/22/2015 08:23:17 [name] ([id number]) -------------------------------------------------------------------------------- Clinic Level of Care Assessment Details Patient Name: [name] Date of Service: 02/22/2015 8:15 AM Medical Record Number: [id number] Patient Account Number: [id number] Date of Birth/Sex: 02-Apr-1928 (81 y.o. Female) Treating RN: Afful, RN, BSN, Velva Harman Primary Care Physician: Constance Goltz Other Clinician: Referring Physician: Constance Goltz Treating Physician/Extender: Frann Rider in Treatment: 23 Clinic Level of Care Assessment Items TOOL 4 Quantity Score []  - Use when only an EandM is performed on FOLLOW-UP visit 0 ASSESSMENTS - Nursing Assessment / Reassessment X -  Reassessment of Co-morbidities (includes updates in patient status) 1 10 X - Reassessment of Adherence to Treatment Plan 1 5 ASSESSMENTS - Wound and Skin Assessment / Reassessment []  - Simple Wound Assessment / Reassessment - one wound 0 X - Complex Wound Assessment / Reassessment - multiple wounds 3 5 []  - Dermatologic / Skin Assessment (not related to wound area) 0 ASSESSMENTS - Focused Assessment []  - Circumferential Edema Measurements - multi extremities 0 []  - Nutritional Assessment / Counseling / Intervention 0 X - Lower Extremity Assessment (monofilament, tuning fork, pulses) 1 5 []  - Peripheral Arterial Disease Assessment (using hand held doppler) 0 ASSESSMENTS - Ostomy and/or Continence Assessment and Care []  - Incontinence Assessment and Management 0 []  - Ostomy Care Assessment and Management (repouching, etc.) 0 PROCESS - Coordination of Care X - Simple Patient / Family Education for ongoing care 1 15 []  - Complex (extensive) Patient / Family Education for ongoing care 0 []  - Staff obtains Programmer, systems, Records, Test Results / Process Orders 0 []  - Staff telephones HHA, Nursing Homes / Clarify orders / etc 0 []  - Routine Transfer to another Facility (non-emergent condition) 0 [name], [name] []  - Routine Hospital Admission (non-emergent condition) 0 []  - New Admissions / Biomedical engineer / Ordering NPWT, Apligraf, etc. 0 []  - Emergency Hospital Admission (emergent condition) 0 []  - Simple Discharge Coordination 0 []  - Complex (extensive) Discharge Coordination 0 PROCESS - Special Needs []  - Pediatric / Minor Patient Management 0 []  - Isolation Patient Management 0 []  - Hearing / Language / Visual special needs 0 []  - Assessment of Community assistance (transportation, D/C planning, etc.) 0 []  - Additional assistance / Altered mentation 0 []  - Support Surface(s) Assessment (bed, cushion, seat, etc.) 0 INTERVENTIONS - Wound Cleansing / Measurement []  -  Simple Wound Cleansing - one wound 0 X - Complex Wound Cleansing - multiple wounds 3 5 X - Wound Imaging (photographs - any number of wounds) 1 5 []  -  Wound Tracing (instead of photographs) 0 []  - Simple Wound Measurement - one wound 0 X - Complex Wound Measurement - multiple wounds 3 5 INTERVENTIONS - Wound Dressings X - Small Wound Dressing one or multiple wounds 3 10 []  - Medium Wound Dressing one or multiple wounds 0 []  - Large Wound Dressing one or multiple wounds 0 []  - Application of Medications - topical 0 []  - Application of Medications - injection 0 INTERVENTIONS - Miscellaneous []  - External ear exam 0 [name], [name] []  - Specimen Collection (cultures, biopsies, blood, body fluids, etc.) 0 []  - Specimen(s) / Culture(s) sent or taken to Lab for analysis 0 []  - Patient Transfer (multiple staff / Harrel Lemon Lift / Similar devices) 0 []  - Simple Staple / Suture removal (25 or less) 0 []  - Complex Staple / Suture removal (26 or more) 0 []  - Hypo / Hyperglycemic Management (close monitor of Blood Glucose) 0 []  - Ankle / Brachial Index (ABI) - do not check if billed separately 0 X - Vital Signs 1 5 Has the patient been seen at the hospital within the last three years: Yes Total Score: 120 Level Of Care: New/Established - Level 4 Electronic Signature(s) Signed: 02/23/2015 4:42:20 PM By: Regan Lemming BSN, RN Entered By: Regan Lemming on 02/23/2015 10:13:10 [name] ([id number]) -------------------------------------------------------------------------------- Complex / Palliative Patient Assessment Details Patient Name: [name] Date of Service: 02/22/2015 8:15 AM Medical Record Number: [id number] Patient Account Number: [id number] Date of Birth/Sex: 10-06-1927 (81 y.o. Female) Treating RN: Cornell Barman Primary Care Physician: Constance Goltz Other Clinician: Referring Physician: Constance Goltz Treating Physician/Extender: Frann Rider in Treatment:  23 Palliative Management Criteria Complex Wound Management Criteria Evaluation by a vascular surgeon has determined that the patient is not a revascularization candidate due to: Everything has been done at this time. Care Approach Wound Care Plan: Complex Wound Management Electronic Signature(s) Signed: 03/02/2015 4:35:21 PM By: Christin Fudge MD, FACS Signed: 03/22/2015 3:56:35 PM By: Gretta Cool RN, BSN, Kim RN, BSN Entered By: Gretta Cool, RN, BSN, Kim on 03/01/2015 11:25:20 [name] ([id number]) -------------------------------------------------------------------------------- Encounter Discharge Information Details Patient Name: [name] Date of Service: 02/22/2015 8:15 AM Medical Record Number: [id number] Patient Account Number: [id number] Date of Birth/Sex: 1928-07-26 (81 y.o. Female) Treating RN: Baruch Gouty, RN, BSN, Velva Harman Primary Care Physician: Constance Goltz Other Clinician: Referring Physician: Constance Goltz Treating Physician/Extender: Frann Rider in Treatment: 30 Encounter Discharge Information Items Discharge Pain Level: 0 Discharge Condition: Stable Ambulatory Status: Wheelchair Nursing Discharge Destination: Home Transportation: Private Auto Accompanied By: self Schedule Follow-up Appointment: No Medication Reconciliation completed No and provided to Patient/Care [name]: Clinical Summary of Care: Electronic Signature(s) Signed: 02/22/2015 4:18:25 PM By: Regan Lemming BSN, RN Entered By: Regan Lemming on 02/22/2015 09:06:08 [name] ([id number]) -------------------------------------------------------------------------------- Lower Extremity Assessment Details Patient Name: [name] Date of Service: 02/22/2015 8:15 AM Medical Record Number: [id number] Patient Account Number: [id number] Date of Birth/Sex: 01-Apr-1928 (81 y.o. Female) Treating RN: Afful, RN, BSN, Velva Harman Primary Care Physician: Constance Goltz Other Clinician: Referring Physician: Constance Goltz Treating Physician/Extender: Frann Rider in Treatment: 23 Vascular Assessment Pulses: Posterior Tibial Dorsalis Pedis Palpable: [Left:No] [Right:No] Doppler: [Left:Multiphasic] [Right:Monophasic] Extremity colors, hair growth, and conditions: Extremity Color: [Left:Normal] [Right:Normal] Hair Growth on Extremity: [Left:No] [Right:No] Temperature of Extremity: [Left:Warm] [Right:Warm] Capillary Refill: [Left:< 3 seconds] [Right:< 3 seconds] Dependent Rubor: [Left:No] [Right:No] Blanched when Elevated: [Left:No] [Right:No] Lipodermatosclerosis: [Left:No] [Right:No] Toe Nail Assessment Left: Right: Thick: Yes Yes Discolored: No No Deformed: No No Improper Length and Hygiene: No  No Electronic Signature(s) Signed: 02/22/2015 4:18:25 PM By: Regan Lemming BSN, RN Entered By: Regan Lemming on 02/22/2015 08:26:06 [name] ([id number]) -------------------------------------------------------------------------------- Multi Wound Chart Details Patient Name: [name] Date of Service: 02/22/2015 8:15 AM Medical Record Number: [id number] Patient Account Number: [id number] Date of Birth/Sex: 29-Apr-1928 (81 y.o. Female) Treating RN: Baruch Gouty, RN, BSN, Velva Harman Primary Care Physician: Constance Goltz Other Clinician: Referring Physician: Constance Goltz Treating Physician/Extender: Frann Rider in Treatment: 23 Vital Signs Height(in): 64 Pulse(bpm): 72 Weight(lbs): 180 Blood Pressure 130/51 (mmHg): Body Mass Index(BMI): 31 Temperature(F): 97.8 Respiratory Rate 18 (breaths/min): Photos: [1:No Photos] [5:No Photos] [6:No Photos] Wound Location: [1:Left Calcaneous - Lateral Right Calcaneous] [6:Left Achilles] Wounding Event: [1:Pressure Injury] [5:Gradually Appeared] [6:Gradually Appeared] Primary Etiology: [1:Arterial Insufficiency Ulcer Arterial Insufficiency Ulcer Arterial Insufficiency Ulcer] Secondary Etiology: [1:Pressure Ulcer] [5:N/A] [6:N/A] Comorbid History:  [1:Glaucoma, Asthma, Hypertension, Peripheral Hypertension, Peripheral Hypertension, Peripheral Venous Disease, Type II Venous Disease, Type II Venous Disease, Type II Diabetes] [5:Glaucoma, Asthma, Diabetes] [6:Glaucoma, Asthma,  Diabetes] Date Acquired: [1:07/24/2014] [5:09/26/2014] [6:09/26/2014] Weeks of Treatment: [1:23] [5:20] [6:20] Wound Status: [1:Open] [5:Open] [6:Open] Measurements L x W x D 0.3x0.3x0.2 [5:1x0.5x0.3] [6:1x0.5x0.1] (cm) Area (cm) : [1:0.071] [5:0.393] [6:0.393] Volume (cm) : [1:0.014] [5:0.118] [6:0.039] % Reduction in Area: [1:98.60%] [5:92.90%] [6:89.60%] % Reduction in Volume: 99.30% [5:78.50%] [6:97.90%] Classification: [1:Partial Thickness] [5:Full Thickness Without Exposed Support Structures] [6:Partial Thickness] HBO Classification: [1:Grade 1] [5:Grade 1] [6:Grade 1] Exudate Amount: [1:Small] [5:Small] [6:Small] Exudate Type: [1:Serous] [5:Serosanguineous] [6:Serous] Exudate Color: [1:amber] [5:red, brown] [6:amber] Wound Margin: [1:Distinct, outline attached Distinct, outline attached Distinct, outline attached] Granulation Amount: [1:Medium (34-66%)] [5:Medium (34-66%)] [6:Medium (34-66%)] Granulation Quality: [1:N/A] [5:Pink] [6:Pink] Necrotic Amount: [1:Small (1-33%)] [5:Small (1-33%)] [6:None Present (0%)] Exposed Structures: [place], Ronnie Doss ([id number]) Fascia: No Fascia: No Fascia: No Fat: No Fat: No Fat: No Tendon: No Tendon: No Tendon: No Muscle: No Muscle: No Muscle: No Joint: No Joint: No Joint: No Bone: No Bone: No Bone: No Limited to Skin Limited to Skin Limited to Skin Breakdown Breakdown Breakdown Epithelialization: Medium (34-66%) Medium (34-66%) Medium (34-66%) Periwound Skin Texture: Edema: Yes Edema: No Edema: Yes Excoriation: No Excoriation: No Excoriation: No Induration: No Induration: No Induration: No Callus: No Callus: No Callus: No Crepitus: No Crepitus: No Crepitus: No Fluctuance: No Fluctuance:  No Fluctuance: No Friable: No Friable: No Friable: No Rash: No Rash: No Rash: No Scarring: No Scarring: No Scarring: No Periwound Skin Moist: Yes Moist: Yes Moist: Yes Moisture: Maceration: No Maceration: No Maceration: No Dry/Scaly: No Dry/Scaly: No Dry/Scaly: No Periwound Skin Color: Atrophie Blanche: No Hemosiderin Staining: Yes Hemosiderin Staining: Yes Cyanosis: No Atrophie Blanche: No Atrophie Blanche: No Ecchymosis: No Cyanosis: No Cyanosis: No Erythema: No Ecchymosis: No Ecchymosis: No Hemosiderin Staining: No Erythema: No Erythema: No Mottled: No Mottled: No Mottled: No Pallor: No Pallor: No Pallor: No Rubor: No Rubor: No Rubor: No Temperature: No Abnormality No Abnormality No Abnormality Tenderness on Yes Yes Yes Palpation: Wound Preparation: Ulcer Cleansing: Ulcer Cleansing: Ulcer Cleansing: Rinsed/Irrigated with Rinsed/Irrigated with Rinsed/Irrigated with Saline Saline Saline Topical Anesthetic Topical Anesthetic Topical Anesthetic Applied: Other: Lidocaine Applied: Other: Lidocaine Applied: Other: lidocaine 4% Cream 4% Ointment 4% Treatment Notes Electronic Signature(s) Signed: 02/22/2015 4:18:25 PM By: Regan Lemming BSN, RN Entered By: Regan Lemming on 02/22/2015 08:31:00 [name] ([id number]) -------------------------------------------------------------------------------- [place] Details Patient Name: [name] Date of Service: 02/22/2015 8:15 AM Medical Record Number: [id number] Patient Account Number: [id number] Date of Birth/Sex: [birth year] (81 y.o. Female) Treating RN: Afful, RN, BSN, [facility] Primary Care Physician:  Constance Goltz Other Clinician: Referring Physician: Constance Goltz Treating Physician/Extender: Frann Rider in Treatment: 71 Active Inactive Abuse / Safety / Falls / Self Care Management Nursing Diagnoses: Impaired physical mobility Potential for falls Goals: Patient will remain  injury free Date Initiated: 09/12/2014 Goal Status: Active Patient/caregiver will verbalize understanding of skin care regimen Date Initiated: 09/12/2014 Goal Status: Active Patient/caregiver will verbalize/demonstrate measures taken to prevent injury and/or falls Date Initiated: 09/12/2014 Goal Status: Active Patient/caregiver will verbalize/demonstrate understanding of what to do in case of emergency Date Initiated: 09/12/2014 Goal Status: Active Interventions: Assess fall risk on admission and as needed Assess: immobility, friction, shearing, incontinence upon admission and as needed Assess impairment of mobility on admission and as needed per policy Provide education on basic hygiene Provide education on fall prevention Provide education on personal and home safety Provide education on safe transfers Treatment Activities: Education provided on Basic Hygiene : 10/12/2014 Notes: Necrotic Tissue [name], [name] ([id number]) Nursing Diagnoses: Impaired tissue integrity related to necrotic/devitalized tissue Knowledge deficit related to management of necrotic/devitalized tissue Goals: Necrotic/devitalized tissue will be minimized in the wound bed Date Initiated: 09/12/2014 Goal Status: Active Patient/caregiver will verbalize understanding of reason and process for debridement of necrotic tissue Date Initiated: 09/12/2014 Goal Status: Active Interventions: Assess patient pain level pre-, during and post procedure and prior to discharge Provide education on necrotic tissue and debridement process Treatment Activities: Apply topical anesthetic as ordered : 02/22/2015 Enzymatic debridement : 02/22/2015 Excisional debridement : 02/22/2015 Notes: Orientation to the Wound Care Program Nursing Diagnoses: Knowledge deficit related to the wound healing center program Goals: Patient/caregiver will verbalize understanding of the [place] Program Date Initiated: 09/12/2014 Goal  Status: Active Interventions: Provide education on orientation to the wound center Notes: Pressure Nursing Diagnoses: Knowledge deficit related to causes and risk factors for pressure ulcer development Knowledge deficit related to management of pressures ulcers Potential for impaired tissue integrity related to pressure, friction, moisture, and shear GoalsRUBYANN, [name] ([id number]) Patient will remain free from development of additional pressure ulcers Date Initiated: 09/12/2014 Goal Status: Active Patient will remain free of pressure ulcers Date Initiated: 09/12/2014 Goal Status: Active Patient/caregiver will verbalize risk factors for pressure ulcer development Date Initiated: 09/12/2014 Goal Status: Active Patient/caregiver will verbalize understanding of pressure ulcer management Date Initiated: 09/12/2014 Goal Status: Active Interventions: Assess: immobility, friction, shearing, incontinence upon admission and as needed Assess offloading mechanisms upon admission and as needed Assess potential for pressure ulcer upon admission and as needed Provide education on pressure ulcers Treatment Activities: Pressure reduction/relief device ordered : 02/22/2015 Notes: Wound/Skin Impairment Nursing Diagnoses: Impaired tissue integrity Knowledge deficit related to ulceration/compromised skin integrity Goals: Patient/caregiver will verbalize understanding of skin care regimen Date Initiated: 09/12/2014 Goal Status: Active Ulcer/skin breakdown will heal within 14 weeks Date Initiated: 09/12/2014 Goal Status: Active Interventions: Assess patient/caregiver ability to obtain necessary supplies Assess patient/caregiver ability to perform ulcer/skin care regimen upon admission and as needed Assess ulceration(s) every visit Provide education on ulcer and skin care Treatment Activities: Skin care regimen initiated : 02/22/2015 [name], [name] ([id number]) Topical wound management initiated  : 02/22/2015 Notes: Electronic Signature(s) Signed: 02/22/2015 4:18:25 PM By: Regan Lemming BSN, RN Entered By: Regan Lemming on 02/22/2015 08:30:35 [name] ([id number]) -------------------------------------------------------------------------------- Pain Assessment Details Patient Name: [name] Date of Service: 02/22/2015 8:15 AM Medical Record Number: [id number] Patient Account Number: [id number] Date of Birth/Sex: [birth year] (81 y.o. Female) Treating RN: Afful, RN, BSN, Velva Harman Primary Care Physician: Constance Goltz Other Clinician: Referring Physician: Constance Goltz  Treating Physician/Extender: Frann Rider in Treatment: 23 Active Problems Location of Pain Severity and Description of Pain Patient Has Paino No Site Locations Pain Management and Medication Current Pain Management: Electronic Signature(s) Signed: 02/22/2015 4:18:25 PM By: Regan Lemming BSN, RN Entered By: Regan Lemming on 02/22/2015 [id number] [name] ([id number]) -------------------------------------------------------------------------------- Patient/Caregiver Education Details Patient Name: [name] Date of Service: 02/22/2015 8:15 AM Medical Record Number: [id number] Patient Account Number: [id number] Date of Birth/Gender: Aug 01, 1928 (81 y.o. Female) Treating RN: Afful, RN, BSN, Velva Harman Primary Care Physician: Constance Goltz Other Clinician: Referring Physician: Constance Goltz Treating Physician/Extender: Frann Rider in Treatment: 28 Education Assessment Education Provided To: Patient Education Topics Provided Basic Hygiene: Methods: Explain/Verbal Responses: State content correctly Pressure: Methods: Explain/Verbal Responses: State content correctly Safety: Methods: Explain/Verbal Responses: State content correctly Welcome To [facility] [place]: Methods: Explain/Verbal Responses: State content correctly Wound Debridement: Methods: Explain/Verbal Responses: State content  correctly Wound/Skin Impairment: Methods: Explain/Verbal Responses: State content correctly Electronic Signature(s) Signed: 02/22/2015 4:18:25 PM By: Regan Lemming BSN, RN Entered By: Regan Lemming on 02/22/2015 09:07:02 [name] ([id number]) -------------------------------------------------------------------------------- Wound Assessment Details Patient Name: [name] Date of Service: 02/22/2015 8:15 AM Medical Record Number: [id number] Patient Account Number: [id number] Date of Birth/Sex: 1927-10-16 (81 y.o. Female) Treating RN: Afful, RN, BSN, [place] Primary Care Physician: Constance Goltz Other Clinician: Referring Physician: Constance Goltz Treating Physician/Extender: Frann Rider in Treatment: 23 Wound Status Wound Number: 1 Primary Arterial Insufficiency Ulcer Etiology: Wound Location: Left Calcaneous - Lateral Secondary Pressure Ulcer Wounding Event: Pressure Injury Etiology: Date Acquired: 07/24/2014 Wound Open Weeks Of Treatment: 23 Status: Clustered Wound: No Comorbid Glaucoma, Asthma, Hypertension, History: Peripheral Venous Disease, Type II Diabetes Photos Photo Uploaded By: Regan Lemming on 02/22/2015 16:14:46 Wound Measurements Length: (cm) 0.3 Width: (cm) 0.3 Depth: (cm) 0.2 Area: (cm) 0.071 Volume: (cm) 0.014 % Reduction in Area: 98.6% % Reduction in Volume: 99.3% Epithelialization: Medium (34-66%) Tunneling: No Undermining: No Wound Description Classification: Partial Thickness Foul O Diabetic Severity (Wagner): Grade 1 Wound Margin: Distinct, outline attached Exudate Amount: Small Exudate Type: Serous Exudate Color: amber dor After Cleansing: No Wound Bed Granulation Amount: Medium (34-66%) Exposed Structure [name], [name] ([id number]) Necrotic Amount: Small (1-33%) Fascia Exposed: No Necrotic Quality: Adherent Slough Fat Layer Exposed: No Tendon Exposed: No Muscle Exposed: No Joint Exposed: No Bone Exposed: No Limited to Skin  Breakdown Periwound Skin Texture Texture Color No Abnormalities Noted: No No Abnormalities Noted: No Callus: No Atrophie Blanche: No Crepitus: No Cyanosis: No Excoriation: No Ecchymosis: No Fluctuance: No Erythema: No Friable: No Hemosiderin Staining: No Induration: No Mottled: No Localized Edema: Yes Pallor: No Rash: No Rubor: No Scarring: No Temperature / Pain Moisture Temperature: No Abnormality No Abnormalities Noted: No Tenderness on Palpation: Yes Dry / Scaly: No Maceration: No Moist: Yes Wound Preparation Ulcer Cleansing: Rinsed/Irrigated with Saline Topical Anesthetic Applied: Other: Lidocaine 4% Cream, Electronic Signature(s) Signed: 02/22/2015 4:18:25 PM By: Regan Lemming BSN, RN Entered By: Regan Lemming on 02/22/2015 08:29:29 [name] ([id number]) -------------------------------------------------------------------------------- Wound Assessment Details Patient Name: [name] Date of Service: 02/22/2015 8:15 AM Medical Record Number: [id number] Patient Account Number: [id number] Date of Birth/Sex: 05/06/1928 (81 y.o. Female) Treating RN: Afful, RN, BSN, Velva Harman Primary Care Physician: Constance Goltz Other Clinician: Referring Physician: Constance Goltz Treating Physician/Extender: Frann Rider in Treatment: 23 Wound Status Wound Number: 5 Primary Arterial Insufficiency Ulcer Etiology: Wound Location: Right Calcaneous Wound Open Wounding Event: Gradually Appeared Status: Date Acquired: 09/26/2014 Comorbid Glaucoma, Asthma, Hypertension, Weeks Of Treatment: 20 History:  Peripheral Venous Disease, Type II Clustered Wound: No Diabetes Photos Photo Uploaded By: Regan Lemming on 02/22/2015 16:15:04 Wound Measurements Length: (cm) 1 Width: (cm) 0.5 Depth: (cm) 0.3 Area: (cm) 0.393 Volume: (cm) 0.118 % Reduction in Area: 92.9% % Reduction in Volume: 78.5% Epithelialization: Medium (34-66%) Undermining: No Wound Description Full Thickness  Without Exposed Foul Odor A Classification: Support Structures Diabetic Severity Grade 1 (Wagner): Wound Margin: Distinct, outline attached Exudate Amount: Small Exudate Type: Serosanguineous Exudate Color: red, brown fter Cleansing: No Wound Bed Granulation Amount: Medium (34-66%) Exposed Structure [name], [name] ([id number]) Granulation Quality: Pink Fascia Exposed: No Necrotic Amount: Small (1-33%) Fat Layer Exposed: No Necrotic Quality: Adherent Slough Tendon Exposed: No Muscle Exposed: No Joint Exposed: No Bone Exposed: No Limited to Skin Breakdown Periwound Skin Texture Texture Color No Abnormalities Noted: No No Abnormalities Noted: No Callus: No Atrophie Blanche: No Crepitus: No Cyanosis: No Excoriation: No Ecchymosis: No Fluctuance: No Erythema: No Friable: No Hemosiderin Staining: Yes Induration: No Mottled: No Localized Edema: No Pallor: No Rash: No Rubor: No Scarring: No Temperature / Pain Moisture Temperature: No Abnormality No Abnormalities Noted: No Tenderness on Palpation: Yes Dry / Scaly: No Maceration: No Moist: Yes Wound Preparation Ulcer Cleansing: Rinsed/Irrigated with Saline Topical Anesthetic Applied: Other: Lidocaine 4% Ointment, Electronic Signature(s) Signed: 02/22/2015 4:18:25 PM By: Regan Lemming BSN, RN Entered By: Regan Lemming on 02/22/2015 08:30:00 [name] ([id number]) -------------------------------------------------------------------------------- Wound Assessment Details Patient Name: [name] Date of Service: 02/22/2015 8:15 AM Medical Record Number: [id number] Patient Account Number: [id number] Date of Birth/Sex: 05/08/1928 (81 y.o. Female) Treating RN: Afful, RN, BSN, Velva Harman Primary Care Physician: Constance Goltz Other Clinician: Referring Physician: Constance Goltz Treating Physician/Extender: Frann Rider in Treatment: 23 Wound Status Wound Number: 6 Primary Arterial Insufficiency  Ulcer Etiology: Wound Location: Left Achilles Wound Open Wounding Event: Gradually Appeared Status: Date Acquired: 09/26/2014 Comorbid Glaucoma, Asthma, Hypertension, Weeks Of Treatment: 20 History: Peripheral Venous Disease, Type II Clustered Wound: No Diabetes Photos Photo Uploaded By: Regan Lemming on 02/22/2015 16:15:16 Wound Measurements Length: (cm) 1 Width: (cm) 0.5 Depth: (cm) 0.1 Area: (cm) 0.393 Volume: (cm) 0.039 % Reduction in Area: 89.6% % Reduction in Volume: 97.9% Epithelialization: Medium (34-66%) Tunneling: No Undermining: No Wound Description Classification: Partial Thickness Foul O Diabetic Severity (Wagner): Grade 1 Wound Margin: Distinct, outline attached Exudate Amount: Small Exudate Type: Serous Exudate Color: amber dor After Cleansing: No Wound Bed Granulation Amount: Medium (34-66%) Exposed Structure Granulation Quality: Pink Fascia Exposed: No Necrotic Amount: None Present (0%) Fat Layer Exposed: No [name], [name] ([id number]) Tendon Exposed: No Muscle Exposed: No Joint Exposed: No Bone Exposed: No Limited to Skin Breakdown Periwound Skin Texture Texture Color No Abnormalities Noted: No No Abnormalities Noted: No Callus: No Atrophie Blanche: No Crepitus: No Cyanosis: No Excoriation: No Ecchymosis: No Fluctuance: No Erythema: No Friable: No Hemosiderin Staining: Yes Induration: No Mottled: No Localized Edema: Yes Pallor: No Rash: No Rubor: No Scarring: No Temperature / Pain Moisture Temperature: No Abnormality No Abnormalities Noted: No Tenderness on Palpation: Yes Dry / Scaly: No Maceration: No Moist: Yes Wound Preparation Ulcer Cleansing: Rinsed/Irrigated with Saline Topical Anesthetic Applied: Other: lidocaine 4%, Electronic Signature(s) Signed: 02/22/2015 4:18:25 PM By: Regan Lemming BSN, RN Entered By: Regan Lemming on 02/22/2015 08:30:24 [name]  ([id number]) -------------------------------------------------------------------------------- Vitals Details Patient Name: [name] Date of Service: 02/22/2015 8:15 AM Medical Record Number: [id number] Patient Account Number: [id number] Date of Birth/Sex: 06-15-1928 (81 y.o. Female) Treating RN: Afful, RN, BSN, Velva Harman Primary Care Physician: Constance Goltz Other Clinician:  Referring Physician: Constance Goltz Treating Physician/Extender: Frann Rider in Treatment: 23 Vital Signs Time Taken: 08:23 Temperature (F): 97.8 Height (in): 64 Pulse (bpm): 72 Weight (lbs): 180 Respiratory Rate (breaths/min): 18 Body Mass Index (BMI): 30.9 Blood Pressure (mmHg): 130/51 Reference Range: 80 - 120 mg / dl Electronic Signature(s) Signed: 02/22/2015 4:18:25 PM By: Regan Lemming BSN, RN Entered By: Regan Lemming on 02/22/2015 08:23:47

## 2015-03-01 ENCOUNTER — Encounter: Payer: Medicare Other | Admitting: Surgery

## 2015-03-01 DIAGNOSIS — L97421 Non-pressure chronic ulcer of left heel and midfoot limited to breakdown of skin: Secondary | ICD-10-CM | POA: Diagnosis not present

## 2015-03-01 NOTE — Progress Notes (Addendum)
ESSENCE, HEACOCK ([id number]) Visit Report for 03/01/2015 Arrival Information Details Patient Name: [name], [name] Date of Service: 03/01/2015 9:30 AM Medical Record Number: [id number] Patient Account Number: [id number] Date of Birth/Sex: 1928-07-06 (81 y.o. Female) Treating RN: Afful, RN, BSN, Velva Harman Primary Care Physician: Constance Goltz Other Clinician: Referring Physician: Constance Goltz Treating Physician/Extender: Frann Rider in Treatment: 24 Visit Information History Since Last Visit Any new allergies or adverse reactions: No Patient Arrived: Wheel Chair Had a fall or experienced change in No Arrival Time: 09:52 activities of daily living that may affect Accompanied By: self risk of falls: Transfer Assistance: None Signs or symptoms of abuse/neglect since last No Patient Identification Verified: Yes visito Secondary Verification Process Yes Hospitalized since last visit: No Completed: Pain Present Now: No Patient Requires Transmission- No Based Precautions: Patient Has Alerts: Yes Patient Alerts: Patient on Blood Thinner Electronic Signature(s) Signed: 03/01/2015 9:54:17 AM By: Regan Lemming BSN, RN Entered By: Regan Lemming on 03/01/2015 09:54:17 [name] ([id number]) -------------------------------------------------------------------------------- Clinic Level of Care Assessment Details Patient Name: [name] Date of Service: 03/01/2015 9:30 AM Medical Record Number: [id number] Patient Account Number: [id number] Date of Birth/Sex: 1927/12/06 (81 y.o. Female) Treating RN: Afful, RN, BSN, Velva Harman Primary Care Physician: Constance Goltz Other Clinician: Referring Physician: Constance Goltz Treating Physician/Extender: Frann Rider in Treatment: 24 Clinic Level of Care Assessment Items TOOL 4 Quantity Score []  - Use when only an EandM is performed on FOLLOW-UP visit 0 ASSESSMENTS - Nursing Assessment / Reassessment X - Reassessment of Co-morbidities  (includes updates in patient status) 1 10 X - Reassessment of Adherence to Treatment Plan 1 5 ASSESSMENTS - Wound and Skin Assessment / Reassessment []  - Simple Wound Assessment / Reassessment - one wound 0 X - Complex Wound Assessment / Reassessment - multiple wounds 2 5 []  - Dermatologic / Skin Assessment (not related to wound area) 0 ASSESSMENTS - Focused Assessment []  - Circumferential Edema Measurements - multi extremities 0 []  - Nutritional Assessment / Counseling / Intervention 0 X - Lower Extremity Assessment (monofilament, tuning fork, pulses) 1 5 []  - Peripheral Arterial Disease Assessment (using hand held doppler) 0 ASSESSMENTS - Ostomy and/or Continence Assessment and Care []  - Incontinence Assessment and Management 0 []  - Ostomy Care Assessment and Management (repouching, etc.) 0 PROCESS - Coordination of Care X - Simple Patient / Family Education for ongoing care 1 15 []  - Complex (extensive) Patient / Family Education for ongoing care 0 []  - Staff obtains Programmer, systems, Records, Test Results / Process Orders 0 []  - Staff telephones HHA, Nursing Homes / Clarify orders / etc 0 []  - Routine Transfer to another Facility (non-emergent condition) 0 [name], [name] ([id number]) []  - Routine Hospital Admission (non-emergent condition) 0 []  - New Admissions / Biomedical engineer / Ordering NPWT, Apligraf, etc. 0 []  - Emergency Hospital Admission (emergent condition) 0 X - Simple Discharge Coordination 1 10 []  - Complex (extensive) Discharge Coordination 0 PROCESS - Special Needs []  - Pediatric / Minor Patient Management 0 []  - Isolation Patient Management 0 []  - Hearing / Language / Visual special needs 0 []  - Assessment of Community assistance (transportation, D/C planning, etc.) 0 []  - Additional assistance / Altered mentation 0 []  - Support Surface(s) Assessment (bed, cushion, seat, etc.) 0 INTERVENTIONS - Wound Cleansing / Measurement []  - Simple Wound Cleansing - one wound  0 X - Complex Wound Cleansing - multiple wounds 2 5 X - Wound Imaging (photographs - any number of wounds) 1 5 []  - Wound Tracing (instead of  photographs) 0 []  - Simple Wound Measurement - one wound 0 []  - Complex Wound Measurement - multiple wounds 0 INTERVENTIONS - Wound Dressings X - Small Wound Dressing one or multiple wounds 2 10 []  - Medium Wound Dressing one or multiple wounds 0 []  - Large Wound Dressing one or multiple wounds 0 []  - Application of Medications - topical 0 []  - Application of Medications - injection 0 INTERVENTIONS - Miscellaneous []  - External ear exam 0 [name], [name] ([id number]) []  - Specimen Collection (cultures, biopsies, blood, body fluids, etc.) 0 []  - Specimen(s) / Culture(s) sent or taken to Lab for analysis 0 []  - Patient Transfer (multiple staff / Harrel Lemon Lift / Similar devices) 0 []  - Simple Staple / Suture removal (25 or less) 0 []  - Complex Staple / Suture removal (26 or more) 0 []  - Hypo / Hyperglycemic Management (close monitor of Blood Glucose) 0 []  - Ankle / Brachial Index (ABI) - do not check if billed separately 0 X - Vital Signs 1 5 Has the patient been seen at the hospital within the last three years: Yes Total Score: 95 Level Of Care: New/Established - Level 3 Electronic Signature(s) Signed: 03/01/2015 10:17:25 AM By: Regan Lemming BSN, RN Entered By: Regan Lemming on 03/01/2015 10:17:25 [name] ([id number]) -------------------------------------------------------------------------------- Encounter Discharge Information Details Patient Name: [name] Date of Service: 03/01/2015 9:30 AM Medical Record Number: [id number] Patient Account Number: [id number] Date of Birth/Sex: 1927/12/03 (81 y.o. Female) Treating RN: Baruch Gouty, RN, BSN, Velva Harman Primary Care Physician: Constance Goltz Other Clinician: Referring Physician: Constance Goltz Treating Physician/Extender: Frann Rider in Treatment: 33 Encounter Discharge Information  Items Discharge Pain Level: 0 Discharge Condition: Stable Ambulatory Status: Wheelchair Nursing Discharge Destination: Home Transportation: Private Auto Accompanied By: self Schedule Follow-up Appointment: No Medication Reconciliation completed No and provided to Patient/Care [name]: Clinical Summary of Care: Electronic Signature(s) Signed: 03/01/2015 10:18:09 AM By: Regan Lemming BSN, RN Entered By: Regan Lemming on 03/01/2015 10:18:09 [name] ([id number]) -------------------------------------------------------------------------------- Lower Extremity Assessment Details Patient Name: [name] Date of Service: 03/01/2015 9:30 AM Medical Record Number: [id number] Patient Account Number: [id number] Date of Birth/Sex: November 15, 1927 (81 y.o. Female) Treating RN: Afful, RN, BSN, Velva Harman Primary Care Physician: Constance Goltz Other Clinician: Referring Physician: Constance Goltz Treating Physician/Extender: Frann Rider in Treatment: 24 Vascular Assessment Pulses: Posterior Tibial Dorsalis Pedis Palpable: [Left:No] [Right:No] Doppler: [Left:Monophasic] [Right:Monophasic] Extremity colors, hair growth, and conditions: Extremity Color: [Left:Normal] [Right:Normal] Hair Growth on Extremity: [Left:No] [Right:No] Temperature of Extremity: [Left:Warm] [Right:Warm] Capillary Refill: [Left:< 3 seconds] [Right:< 3 seconds] Toe Nail Assessment Left: Right: Thick: Yes Yes Discolored: No No Deformed: No No Improper Length and Hygiene: No No Electronic Signature(s) Signed: 03/01/2015 9:55:53 AM By: Regan Lemming BSN, RN Entered By: Regan Lemming on 03/01/2015 09:55:53 [name] ([id number]) -------------------------------------------------------------------------------- Multi Wound Chart Details Patient Name: [name] Date of Service: 03/01/2015 9:30 AM Medical Record Number: [id number] Patient Account Number: [id number] Date of Birth/Sex: 05-Jun-1928 (81 y.o.  Female) Treating RN: Baruch Gouty, RN, BSN, Velva Harman Primary Care Physician: Constance Goltz Other Clinician: Referring Physician: Constance Goltz Treating Physician/Extender: Frann Rider in Treatment: 24 Vital Signs Height(in): 64 Pulse(bpm): 72 Weight(lbs): 180 Blood Pressure 153/63 (mmHg): Body Mass Index(BMI): 31 Temperature(F): 98.7 Respiratory Rate 18 (breaths/min): Photos: [1:No Photos] [5:No Photos] [6:No Photos] Wound Location: [1:Left Calcaneous - Lateral Right Calcaneous] [6:Left Achilles] Wounding Event: [1:Pressure Injury] [5:Gradually Appeared] [6:Gradually Appeared] Primary Etiology: [1:Arterial Insufficiency Ulcer Arterial Insufficiency Ulcer Arterial Insufficiency Ulcer] Secondary Etiology: [1:Pressure Ulcer] [5:N/A] [6:N/A] Comorbid History: [1:Glaucoma, Asthma,  Hypertension, Peripheral Hypertension, Peripheral Hypertension, Peripheral Venous Disease, Type II Venous Disease, Type II Venous Disease, Type II Diabetes] [5:Glaucoma, Asthma, Diabetes] [6:Glaucoma, Asthma,  Diabetes] Date Acquired: [1:07/24/2014] [5:09/26/2014] [6:09/26/2014] Weeks of Treatment: [1:24] [5:21] [6:21] Wound Status: [1:Open] [5:Open] [6:Open] Measurements L x W x D 0.3x0.3x0.1 [5:0.1x0.1x0.1] [6:0.8x0.5x0.1] (cm) Area (cm) : [1:0.071] [5:0.008] [6:0.314] Volume (cm) : [1:0.007] [5:0.001] [6:0.031] % Reduction in Area: [1:98.60%] [5:99.90%] [6:91.70%] % Reduction in Volume: 99.70% [5:99.80%] [6:98.40%] Classification: [1:Partial Thickness] [5:Full Thickness Without Exposed Support Structures] [6:Partial Thickness] HBO Classification: [1:Grade 1] [5:Grade 1] [6:Grade 1] Exudate Amount: [1:Small] [5:Small] [6:Small] Exudate Type: [1:Serous] [5:Serosanguineous] [6:Serous] Exudate Color: [1:amber] [5:red, brown] [6:amber] Wound Margin: [1:Distinct, outline attached Distinct, outline attached Distinct, outline attached] Granulation Amount: [1:Large (67-100%)] [5:Large (67-100%)] [6:Medium  (34-66%)] Granulation Quality: [1:Pink, Pale] [5:Pink] [6:Pink] Necrotic Amount: [1:None Present (0%)] [5:None Present (0%)] [6:None Present (0%)] Exposed Structures: Milmay, [name] ([id number]) Fascia: No Fascia: No Fascia: No Fat: No Fat: No Fat: No Tendon: No Tendon: No Tendon: No Muscle: No Muscle: No Muscle: No Joint: No Joint: No Joint: No Bone: No Bone: No Bone: No Limited to Skin Limited to Skin Limited to Skin Breakdown Breakdown Breakdown Epithelialization: Medium (34-66%) Large (67-100%) Medium (34-66%) Periwound Skin Texture: Edema: Yes Edema: No Edema: Yes Excoriation: No Excoriation: No Excoriation: No Induration: No Induration: No Induration: No Callus: No Callus: No Callus: No Crepitus: No Crepitus: No Crepitus: No Fluctuance: No Fluctuance: No Fluctuance: No Friable: No Friable: No Friable: No Rash: No Rash: No Rash: No Scarring: No Scarring: No Scarring: No Periwound Skin Moist: Yes Dry/Scaly: Yes Moist: Yes Moisture: Maceration: No Maceration: No Maceration: No Dry/Scaly: No Moist: No Dry/Scaly: No Periwound Skin Color: Atrophie Blanche: No Hemosiderin Staining: Yes Hemosiderin Staining: Yes Cyanosis: No Atrophie Blanche: No Atrophie Blanche: No Ecchymosis: No Cyanosis: No Cyanosis: No Erythema: No Ecchymosis: No Ecchymosis: No Hemosiderin Staining: No Erythema: No Erythema: No Mottled: No Mottled: No Mottled: No Pallor: No Pallor: No Pallor: No Rubor: No Rubor: No Rubor: No Temperature: No Abnormality No Abnormality No Abnormality Tenderness on Yes Yes Yes Palpation: Wound Preparation: Ulcer Cleansing: Ulcer Cleansing: Ulcer Cleansing: Rinsed/Irrigated with Rinsed/Irrigated with Rinsed/Irrigated with Saline Saline Saline Topical Anesthetic Topical Anesthetic Applied: Other: Lidocaine Applied: Other: lidocaine 4% Cream 4% Treatment Notes Electronic Signature(s) Signed: 03/01/2015 10:03:45 AM By: Regan Lemming BSN, RN Entered By: Regan Lemming on 03/01/2015 10:03:45 [name] ([id number]) -------------------------------------------------------------------------------- Multi-Disciplinary Care Plan Details Patient Name: [name] Date of Service: 03/01/2015 9:30 AM Medical Record Number: [id number] Patient Account Number: [id number] Date of Birth/Sex: 01-22-1928 (81 y.o. Female) Treating RN: Afful, RN, BSN, Velva Harman Primary Care Physician: Constance Goltz Other Clinician: Referring Physician: Constance Goltz Treating Physician/Extender: Frann Rider in Treatment: 96 Active Inactive Abuse / Safety / Falls / Self Care Management Nursing Diagnoses: Impaired physical mobility Potential for falls Goals: Patient will remain injury free Date Initiated: 09/12/2014 Goal Status: Active Patient/caregiver will verbalize understanding of skin care regimen Date Initiated: 09/12/2014 Goal Status: Active Patient/caregiver will verbalize/demonstrate measures taken to prevent injury and/or falls Date Initiated: 09/12/2014 Goal Status: Active Patient/caregiver will verbalize/demonstrate understanding of what to do in case of emergency Date Initiated: 09/12/2014 Goal Status: Active Interventions: Assess fall risk on admission and as needed Assess: immobility, friction, shearing, incontinence upon admission and as needed Assess impairment of mobility on admission and as needed per policy Provide education on basic hygiene Provide education on fall prevention Provide education on personal and home safety Provide education on safe transfers Treatment Activities:  Education provided on Basic Hygiene : 10/12/2014 Notes: Necrotic Tissue [name], [name] ([id number]) Nursing Diagnoses: Impaired tissue integrity related to necrotic/devitalized tissue Knowledge deficit related to management of necrotic/devitalized tissue Goals: Necrotic/devitalized tissue will be minimized in the wound bed Date  Initiated: 09/12/2014 Goal Status: Active Patient/caregiver will verbalize understanding of reason and process for debridement of necrotic tissue Date Initiated: 09/12/2014 Goal Status: Active Interventions: Assess patient pain level pre-, during and post procedure and prior to discharge Provide education on necrotic tissue and debridement process Treatment Activities: Apply topical anesthetic as ordered : 03/01/2015 Enzymatic debridement : 03/01/2015 Excisional debridement : 03/01/2015 Notes: Orientation to the Wound Care Program Nursing Diagnoses: Knowledge deficit related to the wound healing center program Goals: Patient/caregiver will verbalize understanding of the [facility] Date Initiated: 09/12/2014 Goal Status: Active Interventions: Provide education on orientation to the wound center Notes: Pressure Nursing Diagnoses: Knowledge deficit related to causes and risk factors for pressure ulcer development Knowledge deficit related to management of pressures ulcers Potential for impaired tissue integrity related to pressure, friction, moisture, and shear GoalsPADEE, [name] ([id number]) Patient will remain free from development of additional pressure ulcers Date Initiated: 09/12/2014 Goal Status: Active Patient will remain free of pressure ulcers Date Initiated: 09/12/2014 Goal Status: Active Patient/caregiver will verbalize risk factors for pressure ulcer development Date Initiated: 09/12/2014 Goal Status: Active Patient/caregiver will verbalize understanding of pressure ulcer management Date Initiated: 09/12/2014 Goal Status: Active Interventions: Assess: immobility, friction, shearing, incontinence upon admission and as needed Assess offloading mechanisms upon admission and as needed Assess potential for pressure ulcer upon admission and as needed Provide education on pressure ulcers Treatment Activities: Pressure reduction/relief device ordered :  03/01/2015 Notes: Wound/Skin Impairment Nursing Diagnoses: Impaired tissue integrity Knowledge deficit related to ulceration/compromised skin integrity Goals: Patient/caregiver will verbalize understanding of skin care regimen Date Initiated: 09/12/2014 Goal Status: Active Ulcer/skin breakdown will heal within 14 weeks Date Initiated: 09/12/2014 Goal Status: Active Interventions: Assess patient/caregiver ability to obtain necessary supplies Assess patient/caregiver ability to perform ulcer/skin care regimen upon admission and as needed Assess ulceration(s) every visit Provide education on ulcer and skin care Treatment Activities: Skin care regimen initiated : 03/01/2015 [name], [name] ([id number]) Topical wound management initiated : 03/01/2015 Notes: Electronic Signature(s) Signed: 03/01/2015 10:03:37 AM By: Regan Lemming BSN, RN Entered By: Regan Lemming on 03/01/2015 10:03:37 [name] ([id number]) -------------------------------------------------------------------------------- Pain Assessment Details Patient Name: [name] Date of Service: 03/01/2015 9:30 AM Medical Record Number: [id number] Patient Account Number: [id number] Date of Birth/Sex: 29-Jul-1928 (81 y.o. Female) Treating RN: Baruch Gouty, RN, BSN, Velva Harman Primary Care Physician: Constance Goltz Other Clinician: Referring Physician: Constance Goltz Treating Physician/Extender: Frann Rider in Treatment: 24 Active Problems Location of Pain Severity and Description of Pain Patient Has Paino No Site Locations Pain Management and Medication Current Pain Management: Electronic Signature(s) Signed: 03/01/2015 9:54:23 AM By: Regan Lemming BSN, RN Entered By: Regan Lemming on 03/01/2015 09:54:23 [name] ([id number]) -------------------------------------------------------------------------------- Patient/Caregiver Education Details Patient Name: [name] Date of Service: 03/01/2015 9:30 AM Medical Record  Number: [id number] Patient Account Number: [id number] Date of Birth/Gender: 26-Dec-1927 (81 y.o. Female) Treating RN: Baruch Gouty, RN, BSN, Velva Harman Primary Care Physician: Constance Goltz Other Clinician: Referring Physician: Constance Goltz Treating Physician/Extender: Frann Rider in Treatment: 24 Education Assessment Education Provided To: Patient Education Topics Provided Basic Hygiene: Methods: Explain/Verbal Responses: State content correctly Pressure: Methods: Explain/Verbal Responses: State content correctly Safety: Methods: Explain/Verbal Responses: State content correctly Welcome To [facility] [place]: Methods: Explain/Verbal Responses: State content correctly  Wound Debridement: Methods: Explain/Verbal Responses: State content correctly Wound/Skin Impairment: Methods: Explain/Verbal Responses: State content correctly Electronic Signature(s) Signed: 03/01/2015 10:18:33 AM By: Regan Lemming BSN, RN Entered By: Regan Lemming on 03/01/2015 10:18:32 [name] ([id number]) -------------------------------------------------------------------------------- Wound Assessment Details Patient Name: [name] Date of Service: 03/01/2015 9:30 AM Medical Record Number: [id number] Patient Account Number: [id number] Date of Birth/Sex: 1928/03/11 (81 y.o. Female) Treating RN: Afful, RN, BSN, [place] Primary Care Physician: Constance Goltz Other Clinician: Referring Physician: Constance Goltz Treating Physician/Extender: Frann Rider in Treatment: 24 Wound Status Wound Number: 1 Primary Arterial Insufficiency Ulcer Etiology: Wound Location: Left Calcaneous - Lateral Secondary Pressure Ulcer Wounding Event: Pressure Injury Etiology: Date Acquired: 07/24/2014 Wound Open Weeks Of Treatment: 24 Status: Clustered Wound: No Comorbid Glaucoma, Asthma, Hypertension, History: Peripheral Venous Disease, Type II Diabetes Photos Photo Uploaded By: Regan Lemming on 03/01/2015  17:35:10 Wound Measurements Length: (cm) 0.3 Width: (cm) 0.3 Depth: (cm) 0.1 Area: (cm) 0.071 Volume: (cm) 0.007 % Reduction in Area: 98.6% % Reduction in Volume: 99.7% Epithelialization: Medium (34-66%) Wound Description Classification: Partial Thickness Foul O Diabetic Severity (Wagner): Grade 1 Wound Margin: Distinct, outline attached Exudate Amount: Small Exudate Type: Serous Exudate Color: amber dor After Cleansing: No Wound Bed Granulation Amount: Large (67-100%) Exposed Structure [place], Lanitra ([id number]) Granulation Quality: Pink, Pale Fascia Exposed: No Necrotic Amount: None Present (0%) Fat Layer Exposed: No Tendon Exposed: No Muscle Exposed: No Joint Exposed: No Bone Exposed: No Limited to Skin Breakdown Periwound Skin Texture Texture Color No Abnormalities Noted: No No Abnormalities Noted: No Callus: No Atrophie Blanche: No Crepitus: No Cyanosis: No Excoriation: No Ecchymosis: No Fluctuance: No Erythema: No Friable: No Hemosiderin Staining: No Induration: No Mottled: No Localized Edema: Yes Pallor: No Rash: No Rubor: No Scarring: No Temperature / Pain Moisture Temperature: No Abnormality No Abnormalities Noted: No Tenderness on Palpation: Yes Dry / Scaly: No Maceration: No Moist: Yes Wound Preparation Ulcer Cleansing: Rinsed/Irrigated with Saline Topical Anesthetic Applied: Other: Lidocaine 4% Cream, Treatment Notes Wound #1 (Left, Lateral Calcaneous) 1. Cleansed with: Clean wound with Normal Saline 2. Anesthetic Topical Lidocaine 4% cream to wound bed prior to debridement 4. Dressing Applied: Prisma Ag 5. Secondary Dressing Applied Bordered Foam Dressing Notes Epifix for next visit Electronic Signature(s) Signed: 03/01/2015 10:02:35 AM By: Regan Lemming BSN, RN Entered By: Regan Lemming on 03/01/2015 10:02:35 [name], [name] ([id number]) [name], [name]  ([id number]) -------------------------------------------------------------------------------- Wound Assessment Details Patient Name: [name] Date of Service: 03/01/2015 9:30 AM Medical Record Number: [id number] Patient Account Number: [id number] Date of Birth/Sex: 1927-12-25 (81 y.o. Female) Treating RN: Afful, RN, BSN, Velva Harman Primary Care Physician: Constance Goltz Other Clinician: Referring Physician: Constance Goltz Treating Physician/Extender: Frann Rider in Treatment: 24 Wound Status Wound Number: 5 Primary Arterial Insufficiency Ulcer Etiology: Wound Location: Right Calcaneous Wound Healed - Epithelialized Wounding Event: Gradually Appeared Status: Date Acquired: 09/26/2014 Comorbid Glaucoma, Asthma, Hypertension, Weeks Of Treatment: 21 History: Peripheral Venous Disease, Type II Clustered Wound: No Diabetes Photos Photo Uploaded By: Regan Lemming on 03/01/2015 17:35:29 Wound Measurements Length: (cm) 0 % Reduction Width: (cm) 0 % Reduction Depth: (cm) 0 Epithelializ Area: (cm) 0 Tunneling: Volume: (cm) 0 Undermining in Area: 100% in Volume: 100% ation: Large (67-100%) No : No Wound Description Full Thickness Without Exposed Foul Odor A Classification: Support Structures Diabetic Severity Grade 1 (Wagner): Wound Margin: Distinct, outline attached Exudate Amount: Small Exudate Type: Serosanguineous Exudate Color: red, brown fter Cleansing: No Wound Bed Granulation Amount: Large (67-100%) Exposed [name], [name] ([id number]) Granulation Quality: Pink  Fascia Exposed: No Necrotic Amount: None Present (0%) Fat Layer Exposed: No Tendon Exposed: No Muscle Exposed: No Joint Exposed: No Bone Exposed: No Limited to Skin Breakdown Periwound Skin Texture Texture Color No Abnormalities Noted: No No Abnormalities Noted: No Callus: No Atrophie Blanche: No Crepitus: No Cyanosis: No Excoriation: No Ecchymosis: No Fluctuance: No Erythema:  No Friable: No Hemosiderin Staining: Yes Induration: No Mottled: No Localized Edema: No Pallor: No Rash: No Rubor: No Scarring: No Temperature / Pain Moisture Temperature: No Abnormality No Abnormalities Noted: No Tenderness on Palpation: Yes Dry / Scaly: Yes Maceration: No Moist: No Wound Preparation Ulcer Cleansing: Rinsed/Irrigated with Saline Electronic Signature(s) Signed: 03/01/2015 5:42:40 PM By: Regan Lemming BSN, RN Previous Signature: 03/01/2015 10:02:59 AM Version By: Regan Lemming BSN, RN Entered By: Regan Lemming on 03/01/2015 10:15:51 [name] ([id number]) -------------------------------------------------------------------------------- Wound Assessment Details Patient Name: [name] Date of Service: 03/01/2015 9:30 AM Medical Record Number: [id number] Patient Account Number: [id number] Date of Birth/Sex: 10-30-1927 (81 y.o. Female) Treating RN: Afful, RN, BSN, Velva Harman Primary Care Physician: Constance Goltz Other Clinician: Referring Physician: Constance Goltz Treating Physician/Extender: Frann Rider in Treatment: 24 Wound Status Wound Number: 6 Primary Arterial Insufficiency Ulcer Etiology: Wound Location: Left Achilles Wound Open Wounding Event: Gradually Appeared Status: Date Acquired: 09/26/2014 Comorbid Glaucoma, Asthma, Hypertension, Weeks Of Treatment: 21 History: Peripheral Venous Disease, Type II Clustered Wound: No Diabetes Photos Photo Uploaded By: Regan Lemming on 03/01/2015 17:36:13 Wound Measurements Length: (cm) 0.8 Width: (cm) 0.5 Depth: (cm) 0.1 Area: (cm) 0.314 Volume: (cm) 0.031 % Reduction in Area: 91.7% % Reduction in Volume: 98.4% Epithelialization: Medium (34-66%) Tunneling: No Wound Description Classification: Partial Thickness Foul O Diabetic Severity (Wagner): Grade 1 Wound Margin: Distinct, outline attached Exudate Amount: Small Exudate Type: Serous Exudate Color: amber dor After Cleansing: No Wound  Bed Granulation Amount: Medium (34-66%) Exposed Structure Granulation Quality: Pink Fascia Exposed: No Necrotic Amount: None Present (0%) Fat Layer Exposed: No [name], Marilea ([id number]) Tendon Exposed: No Muscle Exposed: No Joint Exposed: No Bone Exposed: No Limited to Skin Breakdown Periwound Skin Texture Texture Color No Abnormalities Noted: No No Abnormalities Noted: No Callus: No Atrophie Blanche: No Crepitus: No Cyanosis: No Excoriation: No Ecchymosis: No Fluctuance: No Erythema: No Friable: No Hemosiderin Staining: Yes Induration: No Mottled: No Localized Edema: Yes Pallor: No Rash: No Rubor: No Scarring: No Temperature / Pain Moisture Temperature: No Abnormality No Abnormalities Noted: No Tenderness on Palpation: Yes Dry / Scaly: No Maceration: No Moist: Yes Wound Preparation Ulcer Cleansing: Rinsed/Irrigated with Saline Topical Anesthetic Applied: Other: lidocaine 4%, Treatment Notes Wound #6 (Left Achilles) 1. Cleansed with: Clean wound with Normal Saline 2. Anesthetic Topical Lidocaine 4% cream to wound bed prior to debridement 4. Dressing Applied: Prisma Ag 5. Secondary Dressing Applied Bordered Foam Dressing Notes Epifix for next visit Electronic Signature(s) Signed: 03/01/2015 10:03:13 AM By: Regan Lemming BSN, RN Entered By: Regan Lemming on 03/01/2015 10:03:13 [name] ([id number]) -------------------------------------------------------------------------------- [place] Details Patient Name: [name] Date of Service: 03/01/2015 9:30 AM Medical Record Number: [id number] Patient Account Number: [id number] Date of Birth/Sex: 07/19/1928 (81 y.o. Female) Treating RN: Afful, RN, BSN, Velva Harman Primary Care Physician: Constance Goltz Other Clinician: Referring Physician: Constance Goltz Treating Physician/Extender: Frann Rider in Treatment: 24 Vital Signs Time Taken: 09:54 Temperature (F): 98.7 Height (in): 64 Pulse (bpm):  72 Weight (lbs): 180 Respiratory Rate (breaths/min): 18 Body Mass Index (BMI): 30.9 Blood Pressure (mmHg): 153/63 Reference Range: 80 - 120 mg / dl Electronic Signature(s) Signed: 03/01/2015 9:54:49 AM By:  Afful, Velva Harman BSN, RN Entered By: Regan Lemming on 03/01/2015 09:54:49

## 2015-03-01 NOTE — Progress Notes (Signed)
[name], STANDING ([id number]) Visit Report for 03/01/2015 Chief Complaint Document Details Patient Name: [name], [name] Date of Service: 03/01/2015 9:30 AM Medical Record Number: [id number] Patient Account Number: [id number] Date of Birth/Sex: 10-15-1927 (81 y.o. Female) Treating RN: Afful, RN, BSN, Velva Harman Primary Care Physician: Constance Goltz Other Clinician: Referring Physician: Constance Goltz Treating Physician/Extender: Frann Rider in Treatment: 24 Information Obtained from: Patient Chief Complaint Patient presents to the wound care center today with an open arterial ulcer on the left foot big toe and lateral heal. She also has some superficial ulcerations on the second and third toe dorsum. she has no family member with her today and she is a very poor historian and cannot give a proper history. As stated that she walks around with a walker. Electronic Signature(s) Signed: 03/01/2015 10:22:20 AM By: Christin Fudge MD, FACS Entered By: Christin Fudge on 03/01/2015 10:22:19 [name] ([id number]) -------------------------------------------------------------------------------- HPI Details Patient Name: [name] Date of Service: 03/01/2015 9:30 AM Medical Record Number: [id number] Patient Account Number: [id number] Date of Birth/Sex: [birth year] (81 y.o. Female) Treating RN: Baruch Gouty, RN, BSN, Velva Harman Primary Care Physician: Constance Goltz Other Clinician: Referring Physician: Constance Goltz Treating Physician/Extender: Frann Rider in Treatment: 24 History of Present Illness Location: left heel Quality: Patient reports experiencing a dull pain to affected area(s). Severity: Patient states wound are getting worse. Duration: Patient states that they are not certain how long the wound has been present. Timing: Pain in wound is Intermittent (comes and goes Context: The wound appeared gradually over time Associated Signs and Symptoms: Patient reports having difficulty  standing for long periods. HPI Description: This 81 year old patient is a poor historian and comes from a nursing home. Does not have any family members with her. Says she has a ulcer on the lateral part of her left foot and some new ones are there on her right foot too. She is unable to say how long she's had these. she does walk with a walker and this is limited most of the day. Does not recall if she has had any vascular studies done. I have reviewed an x-ray done of the left foot which basically shows osteoporosis but no evidence of fracture dislocation or osteomyelitis. 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 She seems to be doing better overall. 10/19/14 -- I understand she has gone to the vascular surgery and lab yesterday and has had all her tests done yesterday. She is doing fine otherwise and has had no fresh issues. 10/26/2014 she seems to be doing well and has no fresh problems and seems much brighter. 10/12/14 -- Taking her antibiotic and continues to do her dressings locally and she is helped by her skilled nursing. She seems to be doing fine and has no fresh issues. 10/19/14 -- she is doing fine and has had no fresh issues. She says her son to go for her vascular workup yesterday and she has been called back after 3 months. we will try and gather these reports to review what they said. 10/26/14 --I have been able to review notes from the vascular surgery office and these were dated from 10/18/2014. He patient was on her first postop visit and had Doppler ultrasounds done of her arteries. She had more than 50% stenosis of the right superficial femoral artery and more than 50% stenosis in the  left tibioperoneal trunk. She was status post a left lower extremity angiogram with angioplasty of the peroneal and left  superficial femoral arteries for a nonhealing left foot and ankle ulceration. note is made of the fact that the ABIs had not changed in the last month since her previous visit. The opinion of her provider was that she did not need any intervention at this time and they had done everything they could at the present time. They would continue on Plavix and antibiotics and see her back in 3 months for a another arterial duplex study of the lower extremities. [name], [name] ([id number]) 11/02/2014 -- she has spoken to her caregivers about coming for HBO 5 times per week for 6-8 weeks and they are agreeable about this and she is willing to undergo hyperbaric oxygen therapy. 11/16/2014 she has been worked up with a EKG and chest x-ray and these were within normal limits. As far as the investigations go she is ready for HBOT. we are awaiting some insurance clearance so that she can start on her hyperbaric oxygen therapy. 11/30/2014 she tolerated hyperbaric oxygen therapy fine and there were no issues with her blood glucose levels. As noted she is not a diabetic and most of her fingerstick blood glucose levels are inaccurate because of the Raynaud's phenomena. She normally has to have your lobe sticks to get any random glucose levels. I have also requested her primary care does a venous blood glucose draw to check her glucose level. 12/21/2014 -- she is tolerating hyperbaric oxygen very well and has overall made a lot of progress. Her left leg is looking much better. 01/11/2015 - No new complaints today. New ulceration on the dorsum of the right second toe noted today on physical exam. Patient unaware. No significant pain. No fever or chills. No significant drainage. Offloading with sage boots at night. Significantly improved with HBO. 01/19/2015 - the patient has had a x-ray of the right foot done at the nursing home but we still do not have the report and we are awaiting this result. She is otherwise  doing fine with her general health. 01/25/2015 -- X-ray of her right foot showed no acute fracture, dislocation, bony infection or osteomyelitis. The was some soft tissue swelling and osteopenia seen. 02/15/2015 -- due to some insurance issues we could not get her epifix and we are working with her company to get her this. She is doing fine otherwise and has no fresh issues. Electronic Signature(s) Signed: 03/01/2015 10:22:26 AM By: Christin Fudge MD, FACS Entered By: Christin Fudge on 03/01/2015 10:22:26 [name] ([id number]) -------------------------------------------------------------------------------- Physical Exam Details Patient Name: [name] Date of Service: 03/01/2015 9:30 AM Medical Record Number: [id number] Patient Account Number: [id number] Date of Birth/Sex: February 20, 1928 (81 y.o. Female) Treating RN: Afful, RN, BSN, Velva Harman Primary Care Physician: Constance Goltz Other Clinician: Referring Physician: Constance Goltz Treating Physician/Extender: Frann Rider in Treatment: 24 Constitutional . Pulse regular. Respirations normal and unlabored. Afebrile. . Eyes Nonicteric. Reactive to light. Ears, Nose, Mouth, and Throat Lips, teeth, and gums WNL.Marland Kitchen Moist mucosa without lesions . Neck supple and nontender. No palpable supraclavicular or cervical adenopathy. Normal sized without goiter. Respiratory WNL. No retractions.. Cardiovascular Pedal Pulses WNL. No clubbing, cyanosis or edema. Gastrointestinal (GI) Abdomen without masses or tenderness.. No liver or spleen enlargement or tenderness.. Genitourinary (GU) No hydrocele, spermatocele, tenderness of the cord, or testicular mass.Marland Kitchen Penis without lesions.[name] without lesions. No cystocele, or rectocele. Pelvic support intact, no discharge. Marland Kitchen Urethra without masses, tenderness  or scarring.Marland Kitchen Lymphatic No adneopathy. No adenopathy. No adenopathy. Musculoskeletal Adexa without tenderness or enlargement.. Digits  and nails w/o clubbing, cyanosis, infection, petechiae, ischemia, or inflammatory conditions.. Integumentary (Hair, Skin) No suspicious lesions. No crepitus or fluctuance. No peri-wound warmth or erythema. No masses.Marland Kitchen Psychiatric Judgement and insight Intact.. No evidence of depression, anxiety, or agitation.. Notes The right foot is completely healed. The left Achilles tendon has a minimal area open and at this stage we will continue with local dressing. The left lateral calcaneal area has just a small 3-4 mm area open. Electronic Signature(s) Signed: 03/01/2015 10:23:17 AM By: Christin Fudge MD, FACS Entered By: Christin Fudge on 03/01/2015 10:23:16 [name], [name] ([id number]) [name], [name] ([id number]) -------------------------------------------------------------------------------- Physician Orders Details Patient Name: [name] Date of Service: 03/01/2015 9:30 AM Medical Record Number: [id number] Patient Account Number: [id number] Date of Birth/Sex: 1928/02/04 (81 y.o. Female) Treating RN: Afful, RN, BSN, Velva Harman Primary Care Physician: Constance Goltz Other Clinician: Referring Physician: Constance Goltz Treating Physician/Extender: Frann Rider in Treatment: 27 Verbal / Phone Orders: Yes Clinician: Afful, RN, BSN, Rita Read Back and Verified: Yes Diagnosis Coding Wound Cleansing Wound #1 Left,Lateral Calcaneous o Clean wound with Normal Saline. - be sure to clean out all remaining prisma Wound #6 Left Achilles o Clean wound with Normal Saline. - be sure to clean out all remaining prisma Anesthetic Wound #1 Left,Lateral Calcaneous o Topical Lidocaine 4% cream applied to wound bed prior to debridement Wound #6 Left Achilles o Topical Lidocaine 4% cream applied to wound bed prior to debridement Primary Wound Dressing Wound #1 Left,Lateral Calcaneous o Prisma Ag - or collagen with silver equivalent Wound #6 Left Achilles o Prisma Ag - or collagen with silver  equivalent Secondary Dressing Wound #1 Left,Lateral Calcaneous o Boardered Foam Dressing Wound #6 Left Achilles o Boardered Foam Dressing Dressing Change Frequency Wound #1 Left,Lateral Calcaneous o Change dressing every other day. Wound #6 Left Achilles o Change dressing every other day. Follow-up Appointments [place], [place] ([id number]) Wound #1 Left,Lateral Calcaneous o Return Appointment in 1 week. Wound #6 Left Achilles o Return Appointment in 1 week. Off-Loading Wound #1 Left,Lateral Calcaneous o Other: - SAGE boots while sitting or lying. Do not wear while ambulating. Wound #6 Left Achilles o Other: - SAGE boots while sitting or lying. Do not wear while ambulating. Home Health Wound #1 [place] [facility] - [place] Nurse may visit PRN to address patientos wound care needs. o FACE TO FACE ENCOUNTER: MEDICARE and MEDICAID PATIENTS: I certify that this patient is under my care and that I had a face-to-face encounter that meets the physician face-to-face encounter requirements with this patient on this date. The encounter with the patient was in whole or in part for the following MEDICAL CONDITION: (primary reason for [place]) MEDICAL NECESSITY: I certify, that based on my findings, NURSING services are a medically necessary home health service. HOME BOUND STATUS: I certify that my clinical findings support that this patient is homebound (i.e., Due to illness or injury, pt requires aid of supportive devices such as crutches, cane, wheelchairs, walkers, the use of special transportation or the assistance of another person to leave their place of residence. There is a normal inability to leave the home and doing so requires considerable and taxing effort. Other absences are for medical reasons / religious services and are infrequent or of short duration when for other reasons). o If current dressing  causes regression in wound condition, may D/C ordered  dressing product/s and apply Normal Saline Moist Dressing daily until next [place] / Other MD appointment. [name] of regression in wound condition at [phone]. o Please direct any NON-WOUND related issues/requests for orders to patient's Primary Care Physician Wound #6 Left Achilles o [place] [facility] - [place] Nurse may visit PRN to address patientos wound care needs. o FACE TO FACE ENCOUNTER: MEDICARE and MEDICAID PATIENTS: I certify that this patient is under my care and that I had a face-to-face encounter that meets the physician face-to-face encounter requirements with this patient on this date. The encounter with the patient was in whole or in part for the following MEDICAL CONDITION: (primary reason for [place]) MEDICAL NECESSITY: I certify, that based on my findings, NURSING services are a medically necessary home health service. HOME BOUND STATUS: I certify that my clinical findings support that this patient is homebound (i.e., Due to illness or injury, pt requires aid of supportive devices such as crutches, cane, wheelchairs, walkers, the use of special transportation or the assistance of another person to leave their place of residence. There is a normal inability to leave the home and doing so requires considerable and taxing effort. [place], [place] ([id number]) absences are for medical reasons / religious services and are infrequent or of short duration when for other reasons). o If current dressing causes regression in wound condition, may D/C ordered dressing product/s and apply Normal Saline Moist Dressing daily until next [place] / Other MD appointment. [place] of regression in wound condition at [phone]. o Please direct any NON-WOUND related issues/requests for orders to patient's Primary  Care Physician Electronic Signature(s) Signed: 03/01/2015 10:16:37 AM By: Regan Lemming BSN, RN Signed: 03/01/2015 12:22:30 PM By: Christin Fudge MD, FACS Entered By: Regan Lemming on 03/01/2015 10:16:36 [name] ([id number]) -------------------------------------------------------------------------------- Problem List Details Patient Name: [name] Date of Service: 03/01/2015 9:30 AM Medical Record Number: [id number] Patient Account Number: [id number] Date of Birth/Sex: 1928/04/08 (81 y.o. 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 09/12/2014 Yes of heel and midfoot I70.235 Atherosclerosis of native arteries of right leg with 09/12/2014 Yes ulceration of other part of foot L97.412 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 Female) Treating RN: Afful, RN, BSN, Velva Harman Primary Care Physician: Constance Goltz Other Clinician: Referring Physician: Constance Goltz Treating  Physician/Extender: Frann Rider in Treatment: 24 Subjective Chief Complaint Information obtained from Patient Patient presents to the wound care center today with an open arterial ulcer on the left foot big toe and lateral heal. She also has some superficial ulcerations on the second and third toe dorsum. she has no family member with her today and she is a very poor historian and  cannot give a proper history. As stated that she walks around with a walker. History of Present Illness (HPI) The following HPI elements were documented for the patient's wound: Location: left heel Quality: Patient reports experiencing a dull pain to affected area(s). Severity: Patient states wound are getting worse. Duration: Patient states that they are not certain how long the wound has been present. Timing: Pain in wound is Intermittent (comes and goes Context: The wound appeared gradually over time Associated Signs and Symptoms: Patient reports having difficulty standing for long periods. This 81 year old patient is a poor historian and comes from a nursing home. Does not have any family members with her. Says she has a ulcer on the lateral part of her left foot and some new ones are there on her right foot too. She is unable to say how long she's had these. she does walk with a walker and this is limited most of the day. Does not recall if she has had any vascular studies done. I have reviewed an x-ray done of the left foot which basically shows osteoporosis but no evidence of fracture dislocation or osteomyelitis. 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 She seems to be doing better overall. 10/19/14 -- I understand she has gone to the vascular surgery and lab yesterday and has had all her tests done yesterday. She is doing fine  otherwise and has had no fresh issues. 10/26/2014 she seems to be doing well and has no fresh problems and seems much brighter. 10/12/14 -- Taking her antibiotic and continues to do her dressings locally and she is helped by her skilled nursing. She seems to be doing fine and has no fresh issues. 10/19/14 -- she is doing fine and has had no fresh issues. She says her son to go for her vascular workup yesterday and she has been called back after 3 months. we will try and gather these reports to review what [name], [name] ([id number]) they said. 10/26/14 --I have been able to review notes from the vascular surgery office and these were dated from 10/18/2014. He patient was on her first postop visit and had Doppler ultrasounds done of her arteries. She had more than 50% stenosis of the right superficial femoral artery and more than 50% stenosis in the left tibioperoneal trunk. She was status post a left lower extremity angiogram with angioplasty of the peroneal and left superficial femoral arteries for a nonhealing left foot and ankle ulceration. note is made of the fact that the ABIs had not changed in the last month since her previous visit. The opinion of her provider was that she did not need any intervention at this time and they had done everything they could at the present time. They would continue on Plavix and antibiotics and see her back in 3 months for a another arterial duplex study of the lower extremities. 11/02/2014 -- she has spoken to her caregivers about coming for HBO 5 times per week for 6-8 weeks and they are agreeable about this and she is willing to undergo hyperbaric oxygen therapy. 11/16/2014 she has been worked up with a EKG and chest x-ray and these were within normal limits. As far as the investigations go she is ready for HBOT. we are awaiting some insurance clearance so that she can start on her hyperbaric oxygen therapy.  11/30/2014 she tolerated hyperbaric oxygen  therapy fine and there were no issues with her blood glucose levels. As noted she is not a diabetic and most of her fingerstick blood glucose levels are inaccurate because of the Raynaud's phenomena. She normally has to have your lobe sticks to get any random glucose levels. I have also requested her primary care does a venous blood glucose draw to check her glucose level. 12/21/2014 -- she is tolerating hyperbaric oxygen very well and has overall made a lot of progress. Her left leg is looking much better. 01/11/2015 - No new complaints today. New ulceration on the dorsum of the right second toe noted today on physical exam. Patient unaware. No significant pain. No fever or chills. No significant drainage. Offloading with sage boots at night. Significantly improved with HBO. 01/19/2015 - the patient has had a x-ray of the right foot done at the nursing home but we still do not have the report and we are awaiting this result. She is otherwise doing fine with her general health. 01/25/2015 -- X-ray of her right foot showed no acute fracture, dislocation, bony infection or osteomyelitis. The was some soft tissue swelling and osteopenia seen. 02/15/2015 -- due to some insurance issues we could not get her epifix and we are working with her company to get her this. She is doing fine otherwise and has no fresh issues. Objective Constitutional [name], [name] ([id number]) Pulse regular. Respirations normal and unlabored. Afebrile. Vitals Time Taken: 9:54 AM, Height: 64 in, Weight: 180 lbs, BMI: 30.9, Temperature: 98.7 F, Pulse: 72 bpm, Respiratory Rate: 18 breaths/min, Blood Pressure: 153/63 mmHg. Eyes Nonicteric. Reactive to light. Ears, Nose, Mouth, and Throat Lips, teeth, and gums WNL.Marland Kitchen Moist mucosa without lesions . Neck supple and nontender. No palpable supraclavicular or cervical adenopathy. Normal sized without goiter. Respiratory WNL. No retractions.. Cardiovascular Pedal Pulses WNL.  No clubbing, cyanosis or edema. Gastrointestinal (GI) Abdomen without masses or tenderness.. No liver or spleen enlargement or tenderness.. Genitourinary (GU) No hydrocele, spermatocele, tenderness of the cord, or testicular mass.Marland Kitchen Penis without lesions.[name] without lesions. No cystocele, or rectocele. Pelvic support intact, no discharge. Marland Kitchen Urethra without masses, tenderness or scarring.Marland Kitchen Lymphatic No adneopathy. No adenopathy. No adenopathy. Musculoskeletal Adexa without tenderness or enlargement.. Digits and nails w/o clubbing, cyanosis, infection, petechiae, ischemia, or inflammatory conditions.Marland Kitchen Psychiatric Judgement and insight Intact.. No evidence of depression, anxiety, or agitation.. General Notes: The right foot is completely healed. The left Achilles tendon has a minimal area open and at this stage we will continue with local dressing. The left lateral calcaneal area has just a small 3-4 mm area open. Integumentary (Hair, Skin) No suspicious lesions. No crepitus or fluctuance. No peri-wound warmth or erythema. No masses.. Wound #1 status is Open. Original cause of wound was Pressure Injury. The wound is located on the Left,Lateral Calcaneous. The wound measures 0.3cm length x 0.3cm width x 0.1cm depth; 0.071cm^2 area and 0.007cm^3 volume. The wound is limited to skin breakdown. There is a small amount of serous drainage noted. The wound margin is distinct with the outline attached to the wound base. There is large [place], [place] ([id number]) (67-100%) pink, pale granulation within the wound bed. There is no necrotic tissue within the wound bed. The periwound skin appearance exhibited: Localized Edema, Moist. The periwound skin appearance did not exhibit: Callus, Crepitus, Excoriation, Fluctuance, Friable, Induration, Rash, Scarring, Dry/Scaly, Maceration, Atrophie Blanche, Cyanosis, Ecchymosis, Hemosiderin Staining, Mottled, Pallor, Rubor, Erythema. Periwound temperature  was noted as No Abnormality. The periwound  has tenderness on palpation. Wound #5 status is Healed - Epithelialized. Original cause of wound was Gradually Appeared. The wound is located on the Right Calcaneous. The wound measures 0cm length x 0cm width x 0cm depth; 0cm^2 area and 0cm^3 volume. The wound is limited to skin breakdown. There is no tunneling or undermining noted. There is a small amount of serosanguineous drainage noted. The wound margin is distinct with the outline attached to the wound base. There is large (67-100%) pink granulation within the wound bed. There is no necrotic tissue within the wound bed. The periwound skin appearance exhibited: Dry/Scaly, Hemosiderin Staining. The periwound skin appearance did not exhibit: Callus, Crepitus, Excoriation, Fluctuance, Friable, Induration, Localized Edema, Rash, Scarring, Maceration, Moist, Atrophie Blanche, Cyanosis, Ecchymosis, Mottled, Pallor, Rubor, Erythema. Periwound temperature was noted as No Abnormality. The periwound has tenderness on palpation. Wound #6 status is Open. Original cause of wound was Gradually Appeared. The wound is located on the Left Achilles. The wound measures 0.8cm length x 0.5cm width x 0.1cm depth; 0.314cm^2 area and 0.031cm^3 volume. The wound is limited to skin breakdown. There is no tunneling noted. There is a small amount of serous drainage noted. The wound margin is distinct with the outline attached to the wound base. There is medium (34-66%) pink granulation within the wound bed. There is no necrotic tissue within the wound bed. The periwound skin appearance exhibited: Localized Edema, Moist, Hemosiderin Staining. The periwound skin appearance did not exhibit: Callus, Crepitus, Excoriation, Fluctuance, Friable, Induration, Rash, Scarring, Dry/Scaly, Maceration, Atrophie Blanche, Cyanosis, Ecchymosis, Mottled, Pallor, Rubor, Erythema. Periwound temperature was noted as No Abnormality. The periwound  has tenderness on palpation. 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 We will continue with Prisma AG and a bordered foam dressing and offloading. She will come back and see as next week. Plan Wound Cleansing: Wound #1 Left,Lateral Calcaneous: Clean wound with Normal Saline. - be sure to clean out all remaining prisma Wound #6 Left Achilles: Clean wound with Normal Saline. - be sure to clean out all remaining prisma Anesthetic: Wound #1 Left,Lateral Calcaneous: Topical Lidocaine 4% cream applied to wound bed prior to debridement Wound #6 Left Achilles: Topical Lidocaine 4% cream applied to wound bed prior to debridement Primary Wound Dressing: Wound #1 Left,Lateral Calcaneous: Prisma Ag - or collagen with silver equivalent Wound #6 Left Achilles: Prisma Ag - or collagen with silver equivalent Secondary Dressing: Wound #1 Left,Lateral Calcaneous: Boardered Foam Dressing Wound #6 Left Achilles: Boardered Foam Dressing Dressing Change Frequency: Wound #1 Left,Lateral Calcaneous: Change dressing every other day. Wound #6 Left Achilles: Change dressing every other day. Follow-up Appointments: Wound #1 Left,Lateral Calcaneous: Return Appointment in 1 week. Wound #6 Left Achilles: Return Appointment in 1 week. Off-Loading: Wound #1 Left,Lateral  Calcaneous: Other: - SAGE boots while sitting or lying. Do not wear while ambulating. Wound #6 Left Achilles: Other: - SAGE boots while sitting or  lying. Do not wear while ambulating. Home Health: Wound #1 Left,Lateral Calcaneous: Continue Home Health Visits - [facility] Nurse may visit PRN to address patient s wound care needs. FACE TO FACE ENCOUNTER: MEDICARE and MEDICAID PATIENTS: I certify that this patient is under my care and that I had a face-to-face encounter that meets the physician face-to-face encounter requirements with this patient on this date. The encounter with the patient was in whole or in part for the following MEDICAL CONDITION: (primary reason for [place]) MEDICAL NECESSITY: I certify, [name], [name] ([id number]) that based on my findings, NURSING services are a medically necessary home health service. HOME BOUND STATUS: I certify that my clinical findings support that this patient is homebound (i.e., Due to illness or injury, pt requires aid of supportive devices such as crutches, cane, wheelchairs, walkers, the use of special transportation or the assistance of another person to leave their place of residence. There is a normal inability to leave the home and doing so requires considerable and taxing effort. Other absences are for medical reasons / religious services and are infrequent or of short duration when for other reasons). If current dressing causes regression in wound condition, may D/C ordered dressing product/s and apply Normal Saline Moist Dressing daily until next [place] / Other MD appointment. [place] of regression in wound condition at [phone]. Please direct any NON-WOUND related issues/requests for orders to patient's Primary Care Physician Wound #6 Left Achilles: [place] Visits - [facility] Nurse may visit PRN to address patient s wound care needs. FACE TO FACE ENCOUNTER:  MEDICARE and MEDICAID PATIENTS: I certify that this patient is under my care and that I had a face-to-face encounter that meets the physician face-to-face encounter requirements with this patient on this date. The encounter with the patient was in whole or in part for the following MEDICAL CONDITION: (primary reason for [place]) MEDICAL NECESSITY: I certify, that based on my findings, NURSING services are a medically necessary home health service. HOME BOUND STATUS: I certify that my clinical findings support that this patient is homebound (i.e., Due to illness or injury, pt requires aid of supportive devices such as crutches, cane, wheelchairs, walkers, the use of special transportation or the assistance of another person to leave their place of residence. There is a normal inability to leave the home and doing so requires considerable and taxing effort. Other absences are for medical reasons / religious services and are infrequent or of short duration when for other reasons). If current dressing causes regression in wound condition, may D/C ordered dressing product/s and apply Normal Saline Moist Dressing daily until next [place] / Other MD appointment. Agency of regression in wound condition at [phone]. Please direct any NON-WOUND related issues/requests for orders to patient's Primary Care Physician At this stage with the wounds having minimal open area we will abandon the process of trying to get her a skin substitute as her insurance will not cover this. We will continue with Prisma AG and a bordered foam dressing and offloading. She will come back and see as next week. Electronic Signature(s) Signed: 03/01/2015 10:24:59 AM By: Christin Fudge MD, FACS Previous Signature: 03/01/2015 10:24:35 AM Version By: Christin Fudge MD, FACS Previous Signature: 03/01/2015 10:24:06 AM Version By: Christin Fudge MD, FACS Entered By: Christin Fudge on 03/01/2015  10:24:59 [name] ([id number]) -------------------------------------------------------------------------------- SuperBill Details Patient Name: [name] Date of Service: 03/01/2015 Medical Record Number:  [id number] Patient Account Number: [id number] Date of Birth/Sex: [birth year] (81 y.o. 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 S91.104A Unspecified open wound of right lesser toe(s) without damage to nail, initial encounter Facility Procedures CPT4 Code: [id number] Description: 99213 - WOUND CARE VISIT-LEV 3 EST PT Modifier: Quantity: 1 Physician Procedures CPT4 Code Description: [id number] - WC PHYS LEVEL 3 - EST PT ICD-10 Description Diagnosis L97.422 Non-pressure chronic ulcer of left heel and midfoot wi L97.412 Non-pressure chronic ulcer of right heel and midfoot w Modifier: th fat layer expose ith fat layer expos Quantity: 1 d ed Electronic Signature(s) Signed: 03/01/2015 10:26:17 AM By: Christin Fudge MD, FACS Entered By: Christin Fudge on 03/01/2015 10:26:17

## 2015-03-08 ENCOUNTER — Encounter: Payer: Medicare Other | Admitting: Surgery

## 2015-03-08 DIAGNOSIS — L97421 Non-pressure chronic ulcer of left heel and midfoot limited to breakdown of skin: Secondary | ICD-10-CM | POA: Diagnosis not present

## 2015-03-08 NOTE — Progress Notes (Addendum)
MONECIA, OLVERA ([id number]) Visit Report for 03/08/2015 Arrival Information Details Patient Name: [name], [name] Date of Service: 03/08/2015 1:00 PM Medical Record Number: [id number] Patient Account Number: [id number] Date of Birth/Sex: [birth year] (81 y.o. Female) Treating RN: Afful, RN, BSN, Velva Harman Primary Care Physician: Constance Goltz Other Clinician: Referring Physician: Constance Goltz Treating Physician/Extender: Frann Rider in Treatment: 25 Visit Information History Since Last Visit Any new allergies or adverse reactions: No Patient Arrived: Wheel Chair Had a fall or experienced change in No Arrival Time: 13:13 activities of daily living that may affect Accompanied By: self risk of falls: Transfer Assistance: None Signs or symptoms of abuse/neglect since last No Patient Identification Verified: Yes visito Secondary Verification Process Yes Hospitalized since last visit: No Completed: Has Dressing in Place as Prescribed: Yes Patient Requires Transmission- No Pain Present Now: No Based Precautions: Patient Has Alerts: Yes Patient Alerts: Patient on Blood Thinner Electronic Signature(s) Signed: 03/08/2015 1:15:39 PM By: Regan Lemming BSN, RN Entered By: Regan Lemming on 03/08/2015 13:15:39 [name] ([id number]) -------------------------------------------------------------------------------- Clinic Level of Care Assessment Details Patient Name: [name] Date of Service: 03/08/2015 1:00 PM Medical Record Number: [id number] Patient Account Number: [id number] Date of Birth/Sex: March 25, 1928 (81 y.o. Female) Treating RN: Montey Hora Primary Care Physician: Constance Goltz Other Clinician: Referring Physician: Constance Goltz Treating Physician/Extender: Frann Rider in Treatment: 25 Clinic Level of Care Assessment Items TOOL 4 Quantity Score []  - Use when only an EandM is performed on FOLLOW-UP visit 0 ASSESSMENTS - Nursing Assessment / Reassessment X -  Reassessment of Co-morbidities (includes updates in patient status) 1 10 X - Reassessment of Adherence to Treatment Plan 1 5 ASSESSMENTS - Wound and Skin Assessment / Reassessment X - Simple Wound Assessment / Reassessment - one wound 1 5 []  - Complex Wound Assessment / Reassessment - multiple wounds 0 []  - Dermatologic / Skin Assessment (not related to wound area) 0 ASSESSMENTS - Focused Assessment X - Circumferential Edema Measurements - multi extremities 1 5 []  - Nutritional Assessment / Counseling / Intervention 0 X - Lower Extremity Assessment (monofilament, tuning fork, pulses) 1 5 []  - Peripheral Arterial Disease Assessment (using hand held doppler) 0 ASSESSMENTS - Ostomy and/or Continence Assessment and Care []  - Incontinence Assessment and Management 0 []  - Ostomy Care Assessment and Management (repouching, etc.) 0 PROCESS - Coordination of Care X - Simple Patient / Family Education for ongoing care 1 15 []  - Complex (extensive) Patient / Family Education for ongoing care 0 []  - Staff obtains Programmer, systems, Records, Test Results / Process Orders 0 []  - Staff telephones HHA, Nursing Homes / Clarify orders / etc 0 []  - Routine Transfer to another Facility (non-emergent condition) 0 [place], [place] ([id number]) []  - Routine Hospital Admission (non-emergent condition) 0 []  - New Admissions / Biomedical engineer / Ordering NPWT, Apligraf, etc. 0 []  - Emergency Hospital Admission (emergent condition) 0 X - Simple Discharge Coordination 1 10 []  - Complex (extensive) Discharge Coordination 0 PROCESS - Special Needs []  - Pediatric / Minor Patient Management 0 []  - Isolation Patient Management 0 []  - Hearing / Language / Visual special needs 0 []  - Assessment of Community assistance (transportation, D/C planning, etc.) 0 []  - Additional assistance / Altered mentation 0 []  - Support Surface(s) Assessment (bed, cushion, seat, etc.) 0 INTERVENTIONS - Wound Cleansing / Measurement X -  Simple Wound Cleansing - one wound 1 5 []  - Complex Wound Cleansing - multiple wounds 0 X - Wound Imaging (photographs - any number of wounds) 1 5 []  -  Wound Tracing (instead of photographs) 0 X - Simple Wound Measurement - one wound 1 5 []  - Complex Wound Measurement - multiple wounds 0 INTERVENTIONS - Wound Dressings X - Small Wound Dressing one or multiple wounds 1 10 []  - Medium Wound Dressing one or multiple wounds 0 []  - Large Wound Dressing one or multiple wounds 0 []  - Application of Medications - topical 0 []  - Application of Medications - injection 0 INTERVENTIONS - Miscellaneous []  - External ear exam 0 [name], [name] ([id number]) []  - Specimen Collection (cultures, biopsies, blood, body fluids, etc.) 0 []  - Specimen(s) / Culture(s) sent or taken to Lab for analysis 0 []  - Patient Transfer (multiple staff / Civil Service fast streamer / Similar devices) 0 []  - Simple Staple / Suture removal (25 or less) 0 []  - Complex Staple / Suture removal (26 or more) 0 []  - Hypo / Hyperglycemic Management (close monitor of Blood Glucose) 0 []  - Ankle / Brachial Index (ABI) - do not check if billed separately 0 X - Vital Signs 1 5 Has the patient been seen at the hospital within the last three years: Yes Total Score: 85 Level Of Care: New/Established - Level 3 Electronic Signature(s) Signed: 03/08/2015 4:33:06 PM By: Montey Hora Entered By: Montey Hora on 03/08/2015 13:27:17 [name] ([id number]) -------------------------------------------------------------------------------- Encounter Discharge Information Details Patient Name: [name] Date of Service: 03/08/2015 1:00 PM Medical Record Number: [id number] Patient Account Number: [id number] Date of Birth/Sex: 09/12/1927 (81 y.o. Female) Treating RN: Baruch Gouty, RN, BSN, Velva Harman Primary Care Physician: Constance Goltz Other Clinician: Referring Physician: Constance Goltz Treating Physician/Extender: Frann Rider in Treatment:  25 Encounter Discharge Information Items Discharge Pain Level: 0 Discharge Condition: Stable Ambulatory Status: Wheelchair Nursing Discharge Destination: Home Transportation: Other Accompanied By: self Schedule Follow-up Appointment: No Medication Reconciliation completed No and provided to Patient/Care Mihail Prettyman: Clinical Summary of Care: Electronic Signature(s) Signed: 03/08/2015 1:34:55 PM By: Regan Lemming BSN, RN Entered By: Regan Lemming on 03/08/2015 13:34:55 [name] ([id number]) -------------------------------------------------------------------------------- Lower Extremity Assessment Details Patient Name: [name] Date of Service: 03/08/2015 1:00 PM Medical Record Number: [id number] Patient Account Number: [id number] Date of Birth/Sex: 09-04-1927 (81 y.o. Female) Treating RN: Afful, RN, BSN, Velva Harman Primary Care Physician: Constance Goltz Other Clinician: Referring Physician: Constance Goltz Treating Physician/Extender: Frann Rider in Treatment: 25 Vascular Assessment Pulses: Posterior Tibial Dorsalis Pedis Palpable: [Left:No] Doppler: [Left:Monophasic] Extremity colors, hair growth, and conditions: Extremity Color: [Left:Normal] Hair Growth on Extremity: [Left:No] Temperature of Extremity: [Left:Warm] Capillary Refill: [Left:< 3 seconds] Toe Nail Assessment Left: Right: Thick: Yes Discolored: No Deformed: No Improper Length and Hygiene: No Electronic Signature(s) Signed: 03/08/2015 1:18:29 PM By: Regan Lemming BSN, RN Entered By: Regan Lemming on 03/08/2015 13:18:29 [name] ([id number]) -------------------------------------------------------------------------------- Multi Wound Chart Details Patient Name: [name] Date of Service: 03/08/2015 1:00 PM Medical Record Number: [id number] Patient Account Number: [id number] Date of Birth/Sex: April 03, 1928 (81 y.o. Female) Treating RN: Montey Hora Primary Care Physician: Constance Goltz Other  Clinician: Referring Physician: Constance Goltz Treating Physician/Extender: Frann Rider in Treatment: 25 Vital Signs Height(in): 64 Pulse(bpm): 73 Weight(lbs): 180 Blood Pressure 157/55 (mmHg): Body Mass Index(BMI): 31 Temperature(F): 98.4 Respiratory Rate 18 (breaths/min): Photos: [1:No Photos] [6:No Photos] [N/A:N/A] Wound Location: [1:Left Calcaneous - Lateral Left Achilles] [N/A:N/A] Wounding Event: [1:Pressure Injury] [6:Gradually Appeared] [N/A:N/A] Primary Etiology: [1:Arterial Insufficiency Ulcer Arterial Insufficiency Ulcer N/A] Secondary Etiology: [1:Pressure Ulcer] [6:N/A] [N/A:N/A] Comorbid History: [1:Glaucoma, Asthma, Hypertension, Peripheral Hypertension, Peripheral Venous Disease, Type II Venous Disease, Type II Diabetes] [6:Glaucoma, Asthma, Diabetes] [N/A:N/A]  Date Acquired: [1:07/24/2014] [6:09/26/2014] [N/A:N/A] Weeks of Treatment: [1:25] [6:22] [N/A:N/A] Wound Status: [1:Open] [6:Open] [N/A:N/A] Measurements L x W x D 0x0x0 [6:0.9x0.5x0.1] [N/A:N/A] (cm) Area (cm) : [1:0] [6:0.353] [N/A:N/A] Volume (cm) : [1:0] [6:0.035] [N/A:N/A] % Reduction in Area: [1:100.00%] [6:90.60%] [N/A:N/A] % Reduction in Volume: 100.00% [6:98.10%] [N/A:N/A] Classification: [1:Partial Thickness] [6:Partial Thickness] [N/A:N/A] HBO Classification: [1:Grade 1] [6:Grade 1] [N/A:N/A] Exudate Amount: [1:None Present] [6:Small] [N/A:N/A] Exudate Type: [1:N/A] [6:Serous] [N/A:N/A] Exudate Color: [1:N/A] [6:amber] [N/A:N/A] Wound Margin: [1:Distinct, outline attached Distinct, outline attached N/A] Granulation Amount: [1:None Present (0%)] [6:Large (67-100%)] [N/A:N/A] Granulation Quality: [1:N/A] [6:Pink] [N/A:N/A] Necrotic Amount: [1:None Present (0%)] [6:None Present (0%)] [N/A:N/A] Exposed Structures: [1:Fascia: No Fat: No Tendon: No] [6:Fascia: No Fat: No Tendon: No] [N/A:N/A] Muscle: No Muscle: No Joint: No Joint: No Bone: No Bone: No Limited to Skin Limited to  Skin Breakdown Breakdown Epithelialization: Large (67-100%) Medium (34-66%) N/A Periwound Skin Texture: Edema: Yes Edema: Yes N/A Excoriation: No Excoriation: No Induration: No Induration: No Callus: No Callus: No Crepitus: No Crepitus: No Fluctuance: No Fluctuance: No Friable: No Friable: No Rash: No Rash: No Scarring: No Scarring: No Periwound Skin Dry/Scaly: Yes Moist: Yes N/A Moisture: Maceration: No Maceration: No Moist: No Dry/Scaly: No Periwound Skin Color: Atrophie Blanche: No Hemosiderin Staining: Yes N/A Cyanosis: No Atrophie Blanche: No Ecchymosis: No Cyanosis: No Erythema: No Ecchymosis: No Hemosiderin Staining: No Erythema: No Mottled: No Mottled: No Pallor: No Pallor: No Rubor: No Rubor: No Temperature: No Abnormality No Abnormality N/A Tenderness on No No N/A Palpation: Wound Preparation: Ulcer Cleansing: Ulcer Cleansing: N/A Rinsed/Irrigated with Rinsed/Irrigated with Saline Saline Topical Anesthetic Applied: Other: lidocaine 4% Treatment Notes Electronic Signature(s) Signed: 03/08/2015 4:33:06 PM By: Montey Hora Entered By: Montey Hora on 03/08/2015 13:25:18 [name] ([id number]) -------------------------------------------------------------------------------- Multi-Disciplinary Care Plan Details Patient Name: [name] Date of Service: 03/08/2015 1:00 PM Medical Record Number: [id number] Patient Account Number: [id number] Date of Birth/Sex: 1928/07/04 (81 y.o. Female) Treating RN: Montey Hora Primary Care Physician: Constance Goltz Other Clinician: Referring Physician: Constance Goltz Treating Physician/Extender: Frann Rider in Treatment: 84 Active Inactive Abuse / Safety / Falls / Self Care Management Nursing Diagnoses: Impaired physical mobility Potential for falls Goals: Patient will remain injury free Date Initiated: 09/12/2014 Goal Status: Active Patient/caregiver will verbalize understanding of  skin care regimen Date Initiated: 09/12/2014 Goal Status: Active Patient/caregiver will verbalize/demonstrate measures taken to prevent injury and/or falls Date Initiated: 09/12/2014 Goal Status: Active Patient/caregiver will verbalize/demonstrate understanding of what to do in case of emergency Date Initiated: 09/12/2014 Goal Status: Active Interventions: Assess fall risk on admission and as needed Assess: immobility, friction, shearing, incontinence upon admission and as needed Assess impairment of mobility on admission and as needed per policy Provide education on basic hygiene Provide education on fall prevention Provide education on personal and home safety Provide education on safe transfers Treatment Activities: Education provided on Basic Hygiene : 10/12/2014 Notes: Necrotic Tissue [name], [name] ([id number]) Nursing Diagnoses: Impaired tissue integrity related to necrotic/devitalized tissue Knowledge deficit related to management of necrotic/devitalized tissue Goals: Necrotic/devitalized tissue will be minimized in the wound bed Date Initiated: 09/12/2014 Goal Status: Active Patient/caregiver will verbalize understanding of reason and process for debridement of necrotic tissue Date Initiated: 09/12/2014 Goal Status: Active Interventions: Assess patient pain level pre-, during and post procedure and prior to discharge Provide education on necrotic tissue and debridement process Treatment Activities: Apply topical anesthetic as ordered : 03/08/2015 Enzymatic debridement : 03/08/2015 Excisional debridement : 03/08/2015 Notes: Orientation to the Wound Care Program  Nursing Diagnoses: Knowledge deficit related to the wound healing center program Goals: Patient/caregiver will verbalize understanding of the [place] Date Initiated: 09/12/2014 Goal Status: Active Interventions: Provide education on orientation to the wound center Notes: Pressure Nursing  Diagnoses: Knowledge deficit related to causes and risk factors for pressure ulcer development Knowledge deficit related to management of pressures ulcers Potential for impaired tissue integrity related to pressure, friction, moisture, and shear GoalsCHLOYE, [name] ([id number]) Patient will remain free from development of additional pressure ulcers Date Initiated: 09/12/2014 Goal Status: Active Patient will remain free of pressure ulcers Date Initiated: 09/12/2014 Goal Status: Active Patient/caregiver will verbalize risk factors for pressure ulcer development Date Initiated: 09/12/2014 Goal Status: Active Patient/caregiver will verbalize understanding of pressure ulcer management Date Initiated: 09/12/2014 Goal Status: Active Interventions: Assess: immobility, friction, shearing, incontinence upon admission and as needed Assess offloading mechanisms upon admission and as needed Assess potential for pressure ulcer upon admission and as needed Provide education on pressure ulcers Treatment Activities: Pressure reduction/relief device ordered : 03/08/2015 Notes: Wound/Skin Impairment Nursing Diagnoses: Impaired tissue integrity Knowledge deficit related to ulceration/compromised skin integrity Goals: Patient/caregiver will verbalize understanding of skin care regimen Date Initiated: 09/12/2014 Goal Status: Active Ulcer/skin breakdown will heal within 14 weeks Date Initiated: 09/12/2014 Goal Status: Active Interventions: Assess patient/caregiver ability to obtain necessary supplies Assess patient/caregiver ability to perform ulcer/skin care regimen upon admission and as needed Assess ulceration(s) every visit Provide education on ulcer and skin care Treatment Activities: Skin care regimen initiated : 03/08/2015 [name], [name] ([id number]) Topical wound management initiated : 03/08/2015 Notes: Electronic Signature(s) Signed: 03/08/2015 4:33:06 PM By: Montey Hora Entered By:  Montey Hora on 03/08/2015 13:25:01 [name] ([id number]) -------------------------------------------------------------------------------- Pain Assessment Details Patient Name: [name] Date of Service: 03/08/2015 1:00 PM Medical Record Number: [id number] Patient Account Number: [id number] Date of Birth/Sex: 01-06-1928 (82 y.o. Female) Treating RN: Baruch Gouty, RN, BSN, Velva Harman Primary Care Physician: Constance Goltz Other Clinician: Referring Physician: Constance Goltz Treating Physician/Extender: Frann Rider in Treatment: 25 Active Problems Location of Pain Severity and Description of Pain Patient Has Paino No Site Locations Pain Management and Medication Current Pain Management: Electronic Signature(s) Signed: 03/08/2015 1:15:50 PM By: Regan Lemming BSN, RN Entered By: Regan Lemming on 03/08/2015 13:15:50 [name] ([id number]) -------------------------------------------------------------------------------- Patient/Caregiver Education Details Patient Name: [name] Date of Service: 03/08/2015 1:00 PM Medical Record Number: [id number] Patient Account Number: [id number] Date of Birth/Gender: 1928-03-03 (81 y.o. Female) Treating RN: Montey Hora Primary Care Physician: Constance Goltz Other Clinician: Referring Physician: Constance Goltz Treating Physician/Extender: Frann Rider in Treatment: 25 Education Assessment Education Provided To: Patient Education Topics Provided Wound/Skin Impairment: Handouts: Other: triple layer collagen dressing Methods: Demonstration, Explain/Verbal Responses: State content correctly Electronic Signature(s) Signed: 03/08/2015 4:33:06 PM By: Montey Hora Entered By: Montey Hora on 03/08/2015 13:27:54 [name] ([id number]) -------------------------------------------------------------------------------- Wound Assessment Details Patient Name: [name] Date of Service: 03/08/2015 1:00 PM Medical Record  Number: [id number] Patient Account Number: [id number] Date of Birth/Sex: 21-Nov-1927 (81 y.o. Female) Treating RN: Afful, RN, BSN, [place] Primary Care Physician: Constance Goltz Other Clinician: Referring Physician: Constance Goltz Treating Physician/Extender: Frann Rider in Treatment: 25 Wound Status Wound Number: 1 Primary Arterial Insufficiency Ulcer Etiology: Wound Location: Left Calcaneous - Lateral Secondary Pressure Ulcer Wounding Event: Pressure Injury Etiology: Date Acquired: 07/24/2014 Wound Open Weeks Of Treatment: 25 Status: Clustered Wound: No Comorbid Glaucoma, Asthma, Hypertension, History: Peripheral Venous Disease, Type II Diabetes Wound Measurements Length: (cm) 0 % Redu Width: (cm) 0 % Redu Depth: (  cm) 0 Epithe Area: (cm) 0 [place]ne Volume: (cm) 0 Under ction in Area: 100% ction in Volume: 100% lialization: Large (67-100%) ling: No mining: No Wound Description Classification: Partial Thickness Foul O Diabetic Severity (Wagner): Grade 1 Wound Margin: Distinct, outline attached Exudate Amount: None Present dor After Cleansing: No Wound Bed Granulation Amount: None Present (0%) Exposed Structure Necrotic Amount: None Present (0%) Fascia Exposed: No Fat Layer Exposed: No Tendon Exposed: No Muscle Exposed: No Joint Exposed: No Bone Exposed: No Limited to Skin Breakdown Periwound Skin Texture Texture Color No Abnormalities Noted: No No Abnormalities Noted: No Callus: No Atrophie Blanche: No Crepitus: No Cyanosis: No [name], [name] ([id number]) Excoriation: No Ecchymosis: No Fluctuance: No Erythema: No Friable: No Hemosiderin Staining: No Induration: No Mottled: No Localized Edema: Yes Pallor: No Rash: No Rubor: No Scarring: No Temperature / Pain Moisture Temperature: No Abnormality No Abnormalities Noted: No Dry / Scaly: Yes Maceration: No Moist: No Wound Preparation Ulcer Cleansing: Rinsed/Irrigated with Saline Electronic  Signature(s) Signed: 03/08/2015 1:20:42 PM By: Regan Lemming BSN, RN Entered By: Regan Lemming on 03/08/2015 13:20:42 [name] ([id number]) -------------------------------------------------------------------------------- Wound Assessment Details Patient Name: [name] Date of Service: 03/08/2015 1:00 PM Medical Record Number: [id number] Patient Account Number: [id number] Date of Birth/Sex: 07/01/1928 (81 y.o. Female) Treating RN: Afful, RN, BSN, Velva Harman Primary Care Physician: Constance Goltz Other Clinician: Referring Physician: Constance Goltz Treating Physician/Extender: Frann Rider in Treatment: 25 Wound Status Wound Number: 6 Primary Arterial Insufficiency Ulcer Etiology: Wound Location: Left Achilles Wound Open Wounding Event: Gradually Appeared Status: Date Acquired: 09/26/2014 Comorbid Glaucoma, Asthma, Hypertension, Weeks Of Treatment: 22 History: Peripheral Venous Disease, Type II Clustered Wound: No Diabetes Wound Measurements Length: (cm) 0.9 Width: (cm) 0.5 Depth: (cm) 0.1 Area: (cm) 0.353 Volume: (cm) 0.035 % Reduction in Area: 90.6% % Reduction in Volume: 98.1% Epithelialization: Medium (34-66%) Tunneling: No Undermining: No Wound Description Classification: Partial Thickness Foul O Diabetic Severity (Wagner): Grade 1 Wound Margin: Distinct, outline attached Exudate Amount: Small Exudate Type: Serous Exudate Color: amber dor After Cleansing: No Wound Bed Granulation Amount: Large (67-100%) Exposed Structure Granulation Quality: Pink Fascia Exposed: No Necrotic Amount: None Present (0%) Fat Layer Exposed: No Tendon Exposed: No Muscle Exposed: No Joint Exposed: No Bone Exposed: No Limited to Skin Breakdown Periwound Skin Texture Texture Color No Abnormalities Noted: No No Abnormalities Noted: No Callus: No Atrophie Blanche: No Crepitus: No Cyanosis: No [name], [name] ([id number]) Excoriation: No Ecchymosis: No Fluctuance:  No Erythema: No Friable: No Hemosiderin Staining: Yes Induration: No Mottled: No Localized Edema: Yes Pallor: No Rash: No Rubor: No Scarring: No Temperature / Pain Moisture Temperature: No Abnormality No Abnormalities Noted: No Dry / Scaly: No Maceration: No Moist: Yes Wound Preparation Ulcer Cleansing: Rinsed/Irrigated with Saline Topical Anesthetic Applied: Other: lidocaine 4%, Treatment Notes Wound #6 (Left Achilles) 1. Cleansed with: Clean wound with Normal Saline 3. Peri-wound Care: Skin Prep 4. Dressing Applied: Prisma Ag 5. Secondary Dressing Applied Bordered Foam Dressing Dry Gauze Electronic Signature(s) Signed: 03/08/2015 1:20:59 PM By: Regan Lemming BSN, RN Entered By: Regan Lemming on 03/08/2015 13:20:59 [name] ([id number]) -------------------------------------------------------------------------------- Vitals Details Patient Name: [name] Date of Service: 03/08/2015 1:00 PM Medical Record Number: [id number] Patient Account Number: [id number] Date of Birth/Sex: 11-09-1927 (81 y.o. Female) Treating RN: Afful, RN, BSN, Velva Harman Primary Care Physician: Constance Goltz Other Clinician: Referring Physician: Constance Goltz Treating Physician/Extender: Frann Rider in Treatment: 25 Vital Signs Time Taken: 13:15 Temperature (F): 98.4 Height (in): 64 Pulse (bpm): 73 Weight (lbs): 180 Respiratory Rate (  breaths/min): 18 Body Mass Index (BMI): 30.9 Blood Pressure (mmHg): 157/55 Reference Range: 80 - 120 mg / dl Electronic Signature(s) Signed: 03/08/2015 1:16:17 PM By: Regan Lemming BSN, RN Entered By: Regan Lemming on 03/08/2015 13:16:17

## 2015-03-08 NOTE — Progress Notes (Signed)
KESI, BRANK ([id number]) Visit Report for 03/08/2015 Chief Complaint Document Details Patient Name: [name], [name] Date of Service: 03/08/2015 1:00 PM Medical Record Number: [id number] Patient Account Number: [id number] Date of Birth/Sex: 08-14-1928 (81 y.o. Female) Treating RN: Primary Care Physician: Constance Goltz Other Clinician: Referring Physician: Constance Goltz Treating Physician/Extender: Frann Rider in Treatment: 25 Information Obtained from: Patient Chief Complaint Patient presents to the wound care center today with an open arterial ulcer on the left foot big toe and lateral heal. She also has some superficial ulcerations on the second and third toe dorsum. she has no family member with her today and she is a very poor historian and cannot give a proper history. As stated that she walks around with a walker. Electronic Signature(s) Signed: 03/08/2015 1:28:23 PM By: Christin Fudge MD, FACS Entered By: Christin Fudge on 03/08/2015 13:28:23 [name] ([id number]) -------------------------------------------------------------------------------- HPI Details Patient Name: [name] Date of Service: 03/08/2015 1:00 PM Medical Record Number: [id number] Patient Account Number: [id number] Date of Birth/Sex: 06/27/1928 (81 y.o. Female) Treating RN: Primary Care Physician: Constance Goltz Other Clinician: Referring Physician: Constance Goltz Treating Physician/Extender: Frann Rider in Treatment: 25 History of Present Illness Location: left heel Quality: Patient reports experiencing a dull pain to affected area(s). Severity: Patient states wound are getting worse. Duration: Patient states that they are not certain how long the wound has been present. Timing: Pain in wound is Intermittent (comes and goes Context: The wound appeared gradually over time Associated Signs and Symptoms: Patient reports having difficulty standing for long periods. HPI Description: This  81 year old patient is a poor historian and comes from a nursing home. Does not have any family members with her. Says she has a ulcer on the lateral part of her left foot and some new ones are there on her right foot too. She is unable to say how long she's had these. she does walk with a walker and this is limited most of the day. Does not recall if she has had any vascular studies done. I have reviewed an x-ray done of the left foot which basically shows osteoporosis but no evidence of fracture dislocation or osteomyelitis. 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 She seems to be doing better overall. 10/19/14 -- I understand she has gone to the vascular surgery and lab yesterday and has had all her tests done yesterday. She is doing fine otherwise and has had no fresh issues. 10/26/2014 she seems to be doing well and has no fresh problems and seems much brighter. 10/12/14 -- Taking her antibiotic and continues to do her dressings locally and she is helped by her skilled nursing. She seems to be doing fine and has no fresh issues. 10/19/14 -- she is doing fine and has had no fresh issues. She says her son to go for her vascular workup yesterday and she has been called back after 3 months. we will try and gather these reports to review what they said. 10/26/14 --I have been able to review notes from the vascular surgery office and these were dated from 10/18/2014. He patient was on her first postop visit and had Doppler ultrasounds done of her arteries. She had more than 50% stenosis of the right superficial femoral artery and more than 50% stenosis in the left tibioperoneal trunk. She was status post a  left lower extremity angiogram with angioplasty of the peroneal and left superficial femoral arteries for a nonhealing left  foot and ankle ulceration. note is made of the fact that the ABIs had not changed in the last month since her previous visit. The opinion of her provider was that she did not need any intervention at this time and they had done everything they could at the present time. They would continue on Plavix and antibiotics and see her back in 3 months for a another arterial duplex study of the lower extremities. JAYLINNE, SLAPPEY ([id number]) 11/02/2014 -- she has spoken to her caregivers about coming for HBO 5 times per week for 6-8 weeks and they are agreeable about this and she is willing to undergo hyperbaric oxygen therapy. 11/16/2014 she has been worked up with a EKG and chest x-ray and these were within normal limits. As far as the investigations go she is ready for HBOT. we are awaiting some insurance clearance so that she can start on her hyperbaric oxygen therapy. 11/30/2014 she tolerated hyperbaric oxygen therapy fine and there were no issues with her blood glucose levels. As noted she is not a diabetic and most of her fingerstick blood glucose levels are inaccurate because of the Raynaud's phenomena. She normally has to have your lobe sticks to get any random glucose levels. I have also requested her primary care does a venous blood glucose draw to check her glucose level. 12/21/2014 -- she is tolerating hyperbaric oxygen very well and has overall made a lot of progress. Her left leg is looking much better. 01/11/2015 - No new complaints today. New ulceration on the dorsum of the right second toe noted today on physical exam. Patient unaware. No significant pain. No fever or chills. No significant drainage. Offloading with sage boots at night. Significantly improved with HBO. 01/19/2015 - the patient has had a x-ray of the right foot done at the nursing home but we still do not have the report and we are awaiting this result. She is otherwise doing fine with her general health. 01/25/2015 --  X-ray of her right foot showed no acute fracture, dislocation, bony infection or osteomyelitis. The was some soft tissue swelling and osteopenia seen. 02/15/2015 -- due to some insurance issues we could not get her epifix and we are working with her company to get her this. She is doing fine otherwise and has no fresh issues. Electronic Signature(s) Signed: 03/08/2015 1:28:39 PM By: Christin Fudge MD, FACS Entered By: Christin Fudge on 03/08/2015 13:28:39 [name] ([id number]) -------------------------------------------------------------------------------- Physical Exam Details Patient Name: [name] Date of Service: 03/08/2015 1:00 PM Medical Record Number: [id number] Patient Account Number: [id number] Date of Birth/Sex: 20-Jun-1928 (81 y.o. Female) Treating RN: Primary Care Physician: Constance Goltz Other Clinician: Referring Physician: Constance Goltz Treating Physician/Extender: Frann Rider in Treatment: 25 Constitutional . Pulse regular. Respirations normal and unlabored. Afebrile. . Eyes Nonicteric. Reactive to light. Ears, Nose, Mouth, and Throat Lips, teeth, and gums WNL.Marland Kitchen Moist mucosa without lesions . Neck supple and nontender. No palpable supraclavicular or cervical adenopathy. Normal sized without goiter. Respiratory WNL. No retractions.. Cardiovascular Pedal Pulses WNL. No clubbing, cyanosis or edema. Chest Breasts symmetical and no nipple discharge.. Breast tissue WNL, no masses, lumps, or tenderness.. Lymphatic No adneopathy. No adenopathy. No adenopathy. Musculoskeletal Adexa without tenderness or enlargement.. Digits and nails w/o clubbing, cyanosis, infection, petechiae, ischemia, or inflammatory conditions.. Integumentary (Hair, Skin) No suspicious lesions. No crepitus or fluctuance. No peri-wound warmth or erythema. No masses.Marland Kitchen Psychiatric  Judgement and insight Intact.. No evidence of depression, anxiety, or agitation.. Notes the left  posterior heel area has a small open ulcerated wound which is covered healthy granulation tissue. Electronic Signature(s) Signed: 03/08/2015 1:29:15 PM By: Christin Fudge MD, FACS Entered By: Christin Fudge on 03/08/2015 13:29:14 [name] ([id number]) -------------------------------------------------------------------------------- Physician Orders Details Patient Name: [name] Date of Service: 03/08/2015 1:00 PM Medical Record Number: [id number] Patient Account Number: [id number] Date of Birth/Sex: 01/21/1928 (81 y.o. Female) Treating RN: Montey Hora Primary Care Physician: Constance Goltz Other Clinician: Referring Physician: Constance Goltz Treating Physician/Extender: Frann Rider in Treatment: 71 Verbal / Phone Orders: Yes Clinician: Montey Hora Read Back and Verified: Yes Diagnosis Coding Wound Cleansing Wound #6 Left Achilles o Clean wound with Normal Saline. - be sure to clean out all remaining prisma Anesthetic Wound #6 Left Achilles o Topical Lidocaine 4% cream applied to wound bed prior to debridement Primary Wound Dressing Wound #6 Left Achilles o Prisma Ag - or collagen with silver equivalent - triple layer please Secondary Dressing Wound #6 Left Achilles o Boardered Foam Dressing Dressing Change Frequency Wound #6 Left Achilles o Change dressing every other day. Follow-up Appointments Wound #6 Left Achilles o Return Appointment in 1 week. Off-Loading Wound #6 Left Achilles o Other: - SAGE boots while sitting or lying. Do not wear while ambulating. Home Health Wound #6 Left Achilles o Continue Home Health Visits - [facility] Nurse may visit PRN to address patientos wound care needs. o FACE TO FACE ENCOUNTER: MEDICARE and MEDICAID PATIENTS: I certify that this patient is under my care and that I had a face-to-face encounter that meets the physician face-to-face encounter requirements with this patient on this  date. The encounter with the patient was in [place], [place] ([id number]) whole or in part for the following MEDICAL CONDITION: (primary reason for [place]) MEDICAL NECESSITY: I certify, that based on my findings, NURSING services are a medically necessary home health service. HOME BOUND STATUS: I certify that my clinical findings support that this patient is homebound (i.e., Due to illness or injury, pt requires aid of supportive devices such as crutches, cane, wheelchairs, walkers, the use of special transportation or the assistance of another person to leave their place of residence. There is a normal inability to leave the home and doing so requires considerable and taxing effort. Other absences are for medical reasons / religious services and are infrequent or of short duration when for other reasons). o If current dressing causes regression in wound condition, may D/C ordered dressing product/s and apply Normal Saline Moist Dressing daily until next [place] / Other MD appointment. [place] of regression in wound condition at [phone]. o Please direct any NON-WOUND related issues/requests for orders to patient's Primary Care Physician Electronic Signature(s) Signed: 03/08/2015 4:15:37 PM By: Christin Fudge MD, FACS Signed: 03/08/2015 4:33:06 PM By: Montey Hora Entered By: Montey Hora on 03/08/2015 13:26:51 [name] ([id number]) -------------------------------------------------------------------------------- Problem List Details Patient Name: [name] Date of Service: 03/08/2015 1:00 PM Medical Record Number: [id number] Patient Account Number: [id number] Date of Birth/Sex: [birth year] (81 y.o. 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 09/12/2014 Yes of heel and midfoot I70.235 Atherosclerosis  of native arteries of right leg with 09/12/2014 Yes ulceration of other part of foot Inactive Problems Resolved Problems ICD-10 Code Description Active Date Resolved Date L97.412 Non-pressure chronic ulcer of right heel and midfoot with 10/26/2014 10/26/2014 fat layer exposed S91.104A Unspecified open wound of right lesser toe(s) without 01/11/2015 01/11/2015 damage to nail, initial encounter Electronic Signature(s) Signed: 03/08/2015 1:28:15 PM By: Christin Fudge MD, FACS Entered By: Christin Fudge on 03/08/2015 13:28:15 [name] ([id number]) -------------------------------------------------------------------------------- Progress Note Details Patient Name: [name] Date of Service: 03/08/2015 1:00 PM Medical Record Number: [id number] Patient Account Number: [id number] Date of Birth/Sex: [birth year] (81 y.o. Female) Treating RN: Primary Care Physician: Constance Goltz Other Clinician: Referring Physician: Constance Goltz Treating Physician/Extender: Frann Rider in Treatment: 25 Subjective Chief Complaint Information obtained from Patient Patient presents to the wound care center today with an open arterial ulcer on the left foot big toe and lateral heal. She also has some superficial ulcerations on the second and third toe dorsum. she has no family member with her today and she is a very poor historian and cannot give a proper history. As stated that she walks around with a walker. History of Present Illness (HPI) The following HPI elements were documented for the patient's wound: Location: left heel Quality: Patient reports experiencing a dull pain to affected area(s). Severity: Patient states wound are getting  worse. Duration: Patient states that they are not certain how long the wound has been present. Timing: Pain in wound is Intermittent (comes and goes Context: The wound appeared gradually over time Associated Signs and Symptoms: Patient reports having difficulty standing for long periods. This 81 year old patient is a poor historian and comes from a nursing home. Does not have any family members with her. Says she has a ulcer on the lateral part of her left foot and some new ones are there on her right foot too. She is unable to say how long she's had these. she does walk with a walker and this is limited most of the day. Does not recall if she has had any vascular studies done. I have reviewed an x-ray done of the left foot which basically shows osteoporosis but no evidence of fracture dislocation or osteomyelitis. 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 She seems to be doing better overall. 10/19/14 -- I understand she has gone to the vascular surgery and lab yesterday and has had all her tests done yesterday. She is doing fine otherwise and has had no fresh issues. 10/26/2014 she seems to be doing well and has no fresh problems and seems much brighter. 10/12/14 -- Taking her antibiotic and continues to do her dressings locally and she is helped by her skilled nursing. She seems to be doing fine and has no fresh issues. 10/19/14 -- she is doing fine and has had no fresh issues. She says her son to go for her vascular workup yesterday and she has been called back after 3 months. we will try and gather these reports to review what AVERYANNA, DENYER ([id number]) they said. 10/26/14 --I have been able to review notes from the vascular surgery office and these were dated from 10/18/2014. He patient was on her first postop visit and  had Doppler ultrasounds done of her arteries. She had more than 50% stenosis of the right superficial femoral artery  and more than 50% stenosis in the left tibioperoneal trunk. She was status post a left lower extremity angiogram with angioplasty of the peroneal and left superficial femoral arteries for a nonhealing left foot and ankle ulceration. note is made of the fact that the ABIs had not changed in the last month since her previous visit. The opinion of her provider was that she did not need any intervention at this time and they had done everything they could at the present time. They would continue on Plavix and antibiotics and see her back in 3 months for a another arterial duplex study of the lower extremities. 11/02/2014 -- she has spoken to her caregivers about coming for HBO 5 times per week for 6-8 weeks and they are agreeable about this and she is willing to undergo hyperbaric oxygen therapy. 11/16/2014 she has been worked up with a EKG and chest x-ray and these were within normal limits. As far as the investigations go she is ready for HBOT. we are awaiting some insurance clearance so that she can start on her hyperbaric oxygen therapy. 11/30/2014 she tolerated hyperbaric oxygen therapy fine and there were no issues with her blood glucose levels. As noted she is not a diabetic and most of her fingerstick blood glucose levels are inaccurate because of the Raynaud's phenomena. She normally has to have your lobe sticks to get any random glucose levels. I have also requested her primary care does a venous blood glucose draw to check her glucose level. 12/21/2014 -- she is tolerating hyperbaric oxygen very well and has overall made a lot of progress. Her left leg is looking much better. 01/11/2015 - No new complaints today. New ulceration on the dorsum of the right second toe noted today on physical exam. Patient unaware. No significant pain. No fever or chills. No significant  drainage. Offloading with sage boots at night. Significantly improved with HBO. 01/19/2015 - the patient has had a x-ray of the right foot done at the nursing home but we still do not have the report and we are awaiting this result. She is otherwise doing fine with her general health. 01/25/2015 -- X-ray of her right foot showed no acute fracture, dislocation, bony infection or osteomyelitis. The was some soft tissue swelling and osteopenia seen. 02/15/2015 -- due to some insurance issues we could not get her epifix and we are working with her company to get her this. She is doing fine otherwise and has no fresh issues. Objective Constitutional BONNITA, GLATTER ([id number]) Pulse regular. Respirations normal and unlabored. Afebrile. Vitals Time Taken: 1:15 PM, Height: 64 in, Weight: 180 lbs, BMI: 30.9, Temperature: 98.4 F, Pulse: 73 bpm, Respiratory Rate: 18 breaths/min, Blood Pressure: 157/55 mmHg. Eyes Nonicteric. Reactive to light. Ears, Nose, Mouth, and Throat Lips, teeth, and gums WNL.Marland Kitchen Moist mucosa without lesions . Neck supple and nontender. No palpable supraclavicular or cervical adenopathy. Normal sized without goiter. Respiratory WNL. No retractions.. Cardiovascular Pedal Pulses WNL. No clubbing, cyanosis or edema. Chest Breasts symmetical and no nipple discharge.. Breast tissue WNL, no masses, lumps, or tenderness.. Lymphatic No adneopathy. No adenopathy. No adenopathy. Musculoskeletal Adexa without tenderness or enlargement.. Digits and nails w/o clubbing, cyanosis, infection, petechiae, ischemia, or inflammatory conditions.Marland Kitchen Psychiatric Judgement and insight Intact.. No evidence of depression, anxiety, or agitation.. General Notes: the left posterior heel area has a small open ulcerated wound which is covered healthy granulation tissue. Integumentary (Hair, Skin) No suspicious lesions. No crepitus or fluctuance. No peri-wound warmth or erythema. No masses.. Wound #  1  status is Open. Original cause of wound was Pressure Injury. The wound is located on the Left,Lateral Calcaneous. The wound measures 0cm length x 0cm width x 0cm depth; 0cm^2 area and 0cm^3 volume. The wound is limited to skin breakdown. There is no tunneling or undermining noted. There is a none present amount of drainage noted. The wound margin is distinct with the outline attached to the wound base. There is no granulation within the wound bed. There is no necrotic tissue within the wound bed. The periwound skin appearance exhibited: Localized Edema, Dry/Scaly. The periwound skin appearance did not exhibit: Callus, Crepitus, Excoriation, Fluctuance, Friable, Induration, Rash, Scarring, Maceration, Moist, Atrophie Blanche, Cyanosis, Ecchymosis, Hemosiderin Staining, Mottled, Pallor, Rubor, Erythema. Periwound temperature was noted as No Abnormality. [place], [place] ([id number]) Wound #6 status is Open. Original cause of wound was Gradually Appeared. The wound is located on the Left Achilles. The wound measures 0.9cm length x 0.5cm width x 0.1cm depth; 0.353cm^2 area and 0.035cm^3 volume. The wound is limited to skin breakdown. There is no tunneling or undermining noted. There is a small amount of serous drainage noted. The wound margin is distinct with the outline attached to the wound base. There is large (67-100%) pink granulation within the wound bed. There is no necrotic tissue within the wound bed. The periwound skin appearance exhibited: Localized Edema, Moist, Hemosiderin Staining. The periwound skin appearance did not exhibit: Callus, Crepitus, Excoriation, Fluctuance, Friable, Induration, Rash, Scarring, Dry/Scaly, Maceration, Atrophie Blanche, Cyanosis, Ecchymosis, Mottled, Pallor, Rubor, Erythema. Periwound temperature was noted as No Abnormality. 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 Plan Wound Cleansing: Wound #6 Left Achilles: Clean wound with Normal Saline. - be sure to clean out all remaining prisma Anesthetic: Wound #6 Left Achilles: Topical Lidocaine 4% cream applied to wound bed prior to debridement Primary Wound Dressing: Wound #6 Left Achilles: Prisma Ag - or collagen with silver equivalent - triple layer please Secondary Dressing: Wound #6 Left Achilles: Boardered Foam Dressing Dressing Change Frequency: Wound #6 Left Achilles: Change dressing every other day. Follow-up Appointments: Wound #6 Left Achilles: AREEBAH, SHAHZAD ([id number]) Return Appointment in 1 week. Off-Loading: Wound #6 Left Achilles: Other: - SAGE boots while sitting or lying. Do not wear while ambulating. Home Health: Wound #6 Left Achilles: Continue Home Health Visits - [facility] Nurse may visit PRN to address patient s wound care needs. FACE TO FACE ENCOUNTER: MEDICARE and MEDICAID PATIENTS: I certify that this patient is under my care and that I had a face-to-face encounter that meets the physician face-to-face encounter requirements with this patient on this date. The encounter with the patient was in whole or in part for the following MEDICAL CONDITION: (primary reason for [place]) MEDICAL NECESSITY: I certify, that based on my findings, NURSING services are a medically necessary home health service. HOME BOUND STATUS: I certify that my clinical findings support that this patient is homebound (i.e., Due to illness or injury, pt requires aid of supportive devices such as crutches, cane, wheelchairs, walkers, the use of special transportation  or the assistance of another  person to leave their place of residence. There is a normal inability to leave the home and doing so requires considerable and taxing effort. Other absences are for medical reasons / religious services and are infrequent or of short duration when for other reasons). If current dressing causes regression in wound condition, may D/C ordered dressing product/s and apply Normal Saline Moist Dressing daily until next [place] / Other MD appointment. [place] of regression in wound condition at [phone]. Please direct any NON-WOUND related issues/requests for orders to patient's Primary Care Physician We will use Prisma AG and a bordered foam and for her edema have recommended she elevate her limb as much as possible and see as back next week. Electronic Signature(s) Signed: 03/08/2015 1:30:29 PM By: Christin Fudge MD, FACS Entered By: Christin Fudge on 03/08/2015 13:30:29 [name] ([id number]) -------------------------------------------------------------------------------- SuperBill Details Patient Name: [name] Date of Service: 03/08/2015 Medical Record Number: [id number] Patient Account Number: [id number] Date of Birth/Sex: Jul 31, 1928 (81 y.o. 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 I70.235 Atherosclerosis of native arteries of right leg with ulceration of other part of foot Facility Procedures CPT4 Code: [id number] Description: 99213 - WOUND CARE VISIT-LEV 3 EST PT Modifier: Quantity: 1 Physician Procedures CPT4: Description Modifier Quantity Code [id number] -  WC PHYS LEVEL 3 - EST PT 1 ICD-10 Description Diagnosis 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 leg  with ulceration of heel and midfoot Electronic Signature(s) Signed: 03/08/2015 1:30:49 PM By: Christin Fudge MD, FACS Entered By: Christin Fudge on 03/08/2015 13:30:49

## 2015-03-15 ENCOUNTER — Encounter: Payer: Self-pay | Admitting: General Surgery

## 2015-03-15 ENCOUNTER — Encounter (HOSPITAL_BASED_OUTPATIENT_CLINIC_OR_DEPARTMENT_OTHER): Payer: Medicare Other | Admitting: General Surgery

## 2015-03-15 VITALS — BP 160/85 | HR 80 | Temp 98.6°F

## 2015-03-15 DIAGNOSIS — L97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: Secondary | ICD-10-CM

## 2015-03-15 DIAGNOSIS — L97421 Non-pressure chronic ulcer of left heel and midfoot limited to breakdown of skin: Secondary | ICD-10-CM | POA: Diagnosis not present

## 2015-03-15 NOTE — Progress Notes (Signed)
See i heal 

## 2015-03-15 NOTE — Progress Notes (Addendum)
[name] ([id number]) Visit Report for 03/15/2015 Arrival Information Details Patient Name: [name], [name] Date of Service: 03/15/2015 8:00 AM Medical Record Number: [id number] Patient Account Number: [id number] Date of Birth/Sex: Nov 25, 1927 (81 y.o. Female) Treating RN: Primary Care Physician: Constance Goltz Other Clinician: Referring Physician: Constance Goltz Treating Physician/Extender: Benjaman Pott in Treatment: 26 Visit Information History Since Last Visit All ordered tests and consults were completed: No Patient Arrived: Ambulatory Added or deleted any medications: No Arrival Time: 08:04 Any new allergies or adverse reactions: No Accompanied By: daughter Had a fall or experienced change in No Transfer Assistance: None activities of daily living that may affect Patient Requires Transmission- No risk of falls: Based Precautions: Signs or symptoms of abuse/neglect since last No Patient Has Alerts: Yes visito Patient Alerts: Patient on Blood Hospitalized since last visit: No Thinner Pain Present Now: No Electronic Signature(s) Signed: 03/15/2015 5:11:52 PM By: Montey Hora Previous Signature: 03/15/2015 8:06:41 AM Version By: Judene Companion MD Entered By: Montey Hora on 03/15/2015 [id number] [name] ([id number]) -------------------------------------------------------------------------------- Clinic Level of Care Assessment Details Patient Name: [name] Date of Service: 03/15/2015 8:00 AM Medical Record Number: [id number] Patient Account Number: [id number] Date of Birth/Sex: 1927-12-18 (81 y.o. Female) Treating RN: Montey Hora Primary Care Physician: Constance Goltz Other Clinician: Referring Physician: Constance Goltz Treating Physician/Extender: Benjaman Pott in Treatment: 26 Clinic Level of Care Assessment Items TOOL 4 Quantity Score []  - Use when only an EandM is performed on FOLLOW-UP visit 0 ASSESSMENTS - Nursing Assessment /  Reassessment X - Reassessment of Co-morbidities (includes updates in patient status) 1 10 X - Reassessment of Adherence to Treatment Plan 1 5 ASSESSMENTS - Wound and Skin Assessment / Reassessment X - Simple Wound Assessment / Reassessment - one wound 1 5 []  - Complex Wound Assessment / Reassessment - multiple wounds 0 []  - Dermatologic / Skin Assessment (not related to wound area) 0 ASSESSMENTS - Focused Assessment []  - Circumferential Edema Measurements - multi extremities 0 []  - Nutritional Assessment / Counseling / Intervention 0 X - Lower Extremity Assessment (monofilament, tuning fork, pulses) 1 5 []  - Peripheral Arterial Disease Assessment (using hand held doppler) 0 ASSESSMENTS - Ostomy and/or Continence Assessment and Care []  - Incontinence Assessment and Management 0 []  - Ostomy Care Assessment and Management (repouching, etc.) 0 PROCESS - Coordination of Care X - Simple Patient / Family Education for ongoing care 1 15 []  - Complex (extensive) Patient / Family Education for ongoing care 0 []  - Staff obtains Programmer, systems, Records, Test Results / Process Orders 0 []  - Staff telephones HHA, Nursing Homes / Clarify orders / etc 0 []  - Routine Transfer to another Facility (non-emergent condition) 0 [name], [name] []  - Routine Hospital Admission (non-emergent condition) 0 []  - New Admissions / Biomedical engineer / Ordering NPWT, Apligraf, etc. 0 []  - Emergency Hospital Admission (emergent condition) 0 X - Simple Discharge Coordination 1 10 []  - Complex (extensive) Discharge Coordination 0 PROCESS - Special Needs []  - Pediatric / Minor Patient Management 0 []  - Isolation Patient Management 0 []  - Hearing / Language / Visual special needs 0 []  - Assessment of Community assistance (transportation, D/C planning, etc.) 0 []  - Additional assistance / Altered mentation 0 []  - Support Surface(s) Assessment (bed, cushion, seat, etc.) 0 INTERVENTIONS - Wound Cleansing /  Measurement X - Simple Wound Cleansing - one wound 1 5 []  - Complex Wound Cleansing - multiple wounds 0 X - Wound Imaging (photographs - any number of wounds) 1 5 []  -  Wound Tracing (instead of photographs) 0 X - Simple Wound Measurement - one wound 1 5 []  - Complex Wound Measurement - multiple wounds 0 INTERVENTIONS - Wound Dressings X - Small Wound Dressing one or multiple wounds 1 10 []  - Medium Wound Dressing one or multiple wounds 0 []  - Large Wound Dressing one or multiple wounds 0 []  - Application of Medications - topical 0 []  - Application of Medications - injection 0 INTERVENTIONS - Miscellaneous []  - External ear exam 0 [name] ([id number]) []  - Specimen Collection (cultures, biopsies, blood, body fluids, etc.) 0 []  - Specimen(s) / Culture(s) sent or taken to Lab for analysis 0 []  - Patient Transfer (multiple staff / Harrel Lemon Lift / Similar devices) 0 []  - Simple Staple / Suture removal (25 or less) 0 []  - Complex Staple / Suture removal (26 or more) 0 []  - Hypo / Hyperglycemic Management (close monitor of Blood Glucose) 0 []  - Ankle / Brachial Index (ABI) - do not check if billed separately 0 X - Vital Signs 1 5 Has the patient been seen at the hospital within the last three years: Yes Total Score: 80 Level Of Care: New/Established - Level 3 Electronic Signature(s) Signed: 03/15/2015 5:11:52 PM By: Montey Hora Entered By: Montey Hora on 03/15/2015 08:24:45 [name] ([id number]) -------------------------------------------------------------------------------- Encounter Discharge Information Details Patient Name: [name] Date of Service: 03/15/2015 8:00 AM Medical Record Number: [id number] Patient Account Number: [id number] Date of Birth/Sex: Jul 11, 1928 (81 y.o. Female) Treating RN: Montey Hora Primary Care Physician: Constance Goltz Other Clinician: Referring Physician: Constance Goltz Treating Physician/Extender: Benjaman Pott in Treatment:  8 Encounter Discharge Information Items Discharge Pain Level: 0 Discharge Condition: Stable Ambulatory Status: Wheelchair Discharge Destination: Home Private Transportation: Auto Accompanied By: self Schedule Follow-up Appointment: Yes Medication Reconciliation completed and No provided to Patient/Care [name]: Clinical Summary of Care: Electronic Signature(s) Signed: 03/15/2015 8:26:54 AM By: Judene Companion MD Entered By: Judene Companion on 03/15/2015 08:26:54 [name] ([id number]) -------------------------------------------------------------------------------- Lower Extremity Assessment Details Patient Name: [name] Date of Service: 03/15/2015 8:00 AM Medical Record Number: [id number] Patient Account Number: [id number] Date of Birth/Sex: Jul 26, 1928 (81 y.o. Female) Treating RN: Montey Hora Primary Care Physician: Constance Goltz Other Clinician: Referring Physician: Constance Goltz Treating Physician/Extender: Benjaman Pott in Treatment: 26 Vascular Assessment Pulses: Posterior Tibial Dorsalis Pedis Palpable: [Left:No] Doppler: [Left:Monophasic] Extremity colors, hair growth, and conditions: Extremity Color: [Left:Normal] Hair Growth on Extremity: [Left:No] Temperature of Extremity: [Left:Warm] Capillary Refill: [Left:< 3 seconds] Toe Nail Assessment Left: Right: Thick: Yes Discolored: No Deformed: No Improper Length and Hygiene: No Electronic Signature(s) Signed: 03/15/2015 5:11:52 PM By: Montey Hora Entered By: Montey Hora on 03/15/2015 08:26:45 [name] ([id number]) -------------------------------------------------------------------------------- Multi Wound Chart Details Patient Name: [name] Date of Service: 03/15/2015 8:00 AM Medical Record Number: [id number] Patient Account Number: [id number] Date of Birth/Sex: [birth year] (81 y.o. 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 N/A] Comorbid History: [6:Glaucoma, Asthma, Hypertension, Peripheral Venous Disease, Type II Diabetes] [N/A:N/A] Date Acquired: [6:09/26/2014] [N/A:N/A] Weeks of Treatment: [6:23] [N/A:N/A] Wound Status: [6:Open] [N/A:N/A] Measurements L x W x D 0.9x0.5x0.1 [N/A:N/A] (cm) Area (cm) : [6:0.353] [N/A:N/A] Volume (cm) : [6:0.035] [N/A:N/A] %  Reduction in Area: [6:90.60%] [N/A:N/A] % Reduction in Volume: 98.10% [N/A:N/A] Classification: [6:Partial Thickness] [N/A:N/A] HBO Classification: [6:Grade 1] [N/A:N/A] Exudate Amount: [6:Small] [N/A:N/A] Exudate Type: [6:Serous] [N/A:N/A] Exudate Color: [6:amber] [N/A:N/A] Wound Margin: [6:Distinct, outline attached N/A] Granulation Amount: [6:Large (67-100%)] [N/A:N/A] Granulation Quality: [6:Pink] [N/A:N/A] Necrotic Amount: [6:None Present (0%)] [N/A:N/A] Exposed Structures: [6:Fascia: No Fat: No Tendon: No Muscle: No] [N/A:N/A] Joint: No Bone: No Limited to Skin Breakdown Epithelialization: Medium (34-66%) N/A N/A Periwound Skin Texture: Edema: Yes N/A N/A Excoriation: No Induration: No Callus: No Crepitus: No Fluctuance: No Friable: No Rash: No Scarring: No Periwound Skin Moist: Yes N/A N/A Moisture: Maceration: No Dry/Scaly: No Periwound Skin Color: Hemosiderin Staining: Yes N/A N/A Atrophie Blanche: No Cyanosis: No Ecchymosis: No Erythema: No Mottled: No Pallor: No Rubor: No Temperature: No Abnormality N/A N/A Tenderness on No N/A N/A Palpation: Wound  Preparation: Ulcer Cleansing: N/A N/A Rinsed/Irrigated with Saline Topical Anesthetic Applied: None Treatment Notes Wound #6 (Left Achilles) 1. Cleansed with: Clean wound with Normal Saline 4. Dressing Applied: Prisma Ag 5. Secondary Dressing Applied Bordered Foam Dressing Electronic Signature(s) Signed: 03/15/2015 5:11:52 PM By: Montey Hora Entered By: Montey Hora on 03/15/2015 08:27:22 KISMET, REEDER ([id number]) SHARILYNN, CALZADILLA ([id number]) -------------------------------------------------------------------------------- Multi-Disciplinary Care Plan Details Patient Name: [name] Date of Service: 03/15/2015 8:00 AM Medical Record Number: [id number] Patient Account Number: [id number] Date of Birth/Sex: April 21, 1928 (81 y.o. Female) Treating RN: Montey Hora Primary Care Physician: Constance Goltz Other Clinician: Referring Physician: Constance Goltz Treating Physician/Extender: Benjaman Pott in Treatment: 7 Active Inactive Abuse / Safety / Falls / Self Care Management Nursing Diagnoses: Impaired physical mobility Potential for falls Goals: Patient will remain injury free Date Initiated: 09/12/2014 Goal Status: Active Patient/caregiver will verbalize understanding of skin care regimen Date Initiated: 09/12/2014 Goal Status: Active Patient/caregiver will verbalize/demonstrate measures taken to prevent injury and/or falls Date Initiated: 09/12/2014 Goal Status: Active Patient/caregiver will verbalize/demonstrate understanding of what to do in case of emergency Date Initiated: 09/12/2014 Goal Status: Active Interventions: Assess fall risk on admission and as needed Assess: immobility, friction, shearing, incontinence upon admission and as needed Assess impairment of mobility on admission and as needed per policy Provide education on basic hygiene Provide education on fall prevention Provide education on personal and home safety Provide education on safe  transfers Treatment Activities: Education provided on Basic Hygiene : 10/12/2014 Notes: Necrotic Tissue SKYLIE, DOBRANSKY ([id number]) Nursing Diagnoses: Impaired tissue integrity related to necrotic/devitalized tissue Knowledge deficit related to management of necrotic/devitalized tissue Goals: Necrotic/devitalized tissue will be minimized in the wound bed Date Initiated: 09/12/2014 Goal Status: Active Patient/caregiver will verbalize understanding of reason and process for debridement of necrotic tissue Date Initiated: 09/12/2014 Goal Status: Active Interventions: Assess patient pain level pre-, during and post procedure and prior to discharge Provide education on necrotic tissue and debridement process Treatment Activities: Apply topical anesthetic as ordered : 03/15/2015 Enzymatic debridement : 03/15/2015 Excisional debridement : 03/15/2015 Notes: Orientation to the Wound Care Program Nursing Diagnoses: Knowledge deficit related to the wound healing center program Goals: Patient/caregiver will verbalize understanding of the [place] Program Date Initiated: 09/12/2014 Goal Status: Active Interventions: Provide education on orientation to the wound center Notes: Pressure Nursing Diagnoses: Knowledge deficit related to causes and risk factors for pressure ulcer development Knowledge deficit related to management of pressures ulcers Potential for impaired tissue integrity related to pressure, friction, moisture, and shear GoalsNEMIAH, LEDEZMA ([id number]) Patient will remain free from development of additional pressure ulcers Date Initiated: 09/12/2014 Goal Status: Active Patient will  remain free of pressure ulcers Date Initiated: 09/12/2014 Goal Status: Active Patient/caregiver will verbalize risk factors for pressure ulcer development Date Initiated: 09/12/2014 Goal Status: Active Patient/caregiver will verbalize understanding of pressure ulcer management Date  Initiated: 09/12/2014 Goal Status: Active Interventions: Assess: immobility, friction, shearing, incontinence upon admission and as needed Assess offloading mechanisms upon admission and as needed Assess potential for pressure ulcer upon admission and as needed Provide education on pressure ulcers Treatment Activities: Pressure reduction/relief device ordered : 03/15/2015 Notes: Wound/Skin Impairment Nursing Diagnoses: Impaired tissue integrity Knowledge deficit related to ulceration/compromised skin integrity Goals: Patient/caregiver will verbalize understanding of skin care regimen Date Initiated: 09/12/2014 Goal Status: Active Ulcer/skin breakdown will heal within 14 weeks Date Initiated: 09/12/2014 Goal Status: Active Interventions: Assess patient/caregiver ability to obtain necessary supplies Assess patient/caregiver ability to perform ulcer/skin care regimen upon admission and as needed Assess ulceration(s) every visit Provide education on ulcer and skin care Treatment Activities: Skin care regimen initiated : 03/15/2015 MARSA, VALIN ([id number]) Topical wound management initiated : 03/15/2015 Notes: Electronic Signature(s) Signed: 03/15/2015 5:11:52 PM By: Montey Hora Entered By: Montey Hora on 03/15/2015 08:27:05 [name] ([id number]) -------------------------------------------------------------------------------- Patient/Caregiver Education Details Patient Name: [name] Date of Service: 03/15/2015 8:00 AM Medical Record Number: [id number] Patient Account Number: [id number] Date of Birth/Gender: 1927/10/01 (81 y.o. Female) Treating RN: Montey Hora Primary Care Physician: Constance Goltz Other Clinician: Referring Physician: Constance Goltz Treating Physician/Extender: Benjaman Pott in Treatment: 60 Education Assessment Education Provided To: Patient Education Topics Provided Offloading: Handouts: Other: continue offloading Methods:  Explain/Verbal Responses: State content correctly Electronic Signature(s) Signed: 03/15/2015 8:27:03 AM By: Judene Companion MD Entered By: Judene Companion on 03/15/2015 08:27:03 SENAIT, KUC ([id number]) -------------------------------------------------------------------------------- Wound Assessment Details Patient Name: [name] Date of Service: 03/15/2015 8:00 AM Medical Record Number: [id number] Patient Account Number: [id number] Date of Birth/Sex: 08-Aug-1928 (81 y.o. Female) Treating RN: Montey Hora Primary Care Physician: Constance Goltz Other Clinician: Referring Physician: Constance Goltz Treating Physician/Extender: Frann Rider in Treatment: 26 Wound Status Wound Number: 6 Primary Arterial Insufficiency Ulcer Etiology: Wound Location: Left Achilles Wound Open Wounding Event: Gradually Appeared Status: Date Acquired: 09/26/2014 Comorbid Glaucoma, Asthma, Hypertension, Weeks Of Treatment: 23 History: Peripheral Venous Disease, Type II Clustered Wound: No Diabetes Photos Photo Uploaded By: Montey Hora on 03/15/2015 13:01:16 Wound Measurements Length: (cm) 0.9 Width: (cm) 0.5 Depth: (cm) 0.1 Area: (cm) 0.353 Volume: (cm) 0.035 % Reduction in Area: 90.6% % Reduction in Volume: 98.1% Epithelialization: Medium (34-66%) Tunneling: No Undermining: No Wound Description Classification: Partial Thickness Foul O Diabetic Severity (Wagner): Grade 1 Wound Margin: Distinct, outline attached Exudate Amount: Small Exudate Type: Serous Exudate Color: amber dor After Cleansing: No Wound Bed Granulation Amount: Large (67-100%) Exposed Structure Granulation Quality: Pink Fascia Exposed: No Necrotic Amount: None Present (0%) Fat Layer Exposed: No [place], Kimyah ([id number]) Tendon Exposed: No Muscle Exposed: No Joint Exposed: No Bone Exposed: No Limited to Skin Breakdown Periwound Skin Texture Texture Color No Abnormalities Noted: No No Abnormalities  Noted: No Callus: No Atrophie Blanche: No Crepitus: No Cyanosis: No Excoriation: No Ecchymosis: No Fluctuance: No Erythema: No Friable: No Hemosiderin Staining: Yes Induration: No Mottled: No Localized Edema: Yes Pallor: No Rash: No Rubor: No Scarring: No Temperature / Pain Moisture Temperature: No Abnormality No Abnormalities Noted: No Dry / Scaly: No Maceration: No Moist: Yes Wound Preparation Ulcer Cleansing: Rinsed/Irrigated with Saline Topical Anesthetic Applied: None Treatment Notes Wound #6 (Left Achilles) 1. Cleansed with: Clean wound with Normal Saline 4. Dressing Applied: Prisma Ag 5. Secondary Dressing  Applied Bordered Foam Dressing Electronic Signature(s) Signed: 03/15/2015 5:11:52 PM By: Montey Hora Entered By: Montey Hora on 03/15/2015 08:22:47 [name] ([id number]) -------------------------------------------------------------------------------- [place] Details Patient Name: [name] Date of Service: 03/15/2015 8:00 AM Medical Record Number: [id number] Patient Account Number: [id number] Date of Birth/Sex: [birth year] (81 y.o. Female) Treating RN: Montey Hora Primary Care Physician: Constance Goltz Other Clinician: Referring Physician: Constance Goltz Treating Physician/Extender: Benjaman Pott in Treatment: 26 Vital Signs Time Taken: 08:14 Temperature (F): 98.3 Height (in): 64 Pulse (bpm): 66 Weight (lbs): 180 Respiratory Rate (breaths/min): 18 Body Mass Index (BMI): 30.9 Blood Pressure (mmHg): 149/53 Reference Range: 80 - 120 mg / dl Electronic Signature(s) Signed: 03/15/2015 5:11:52 PM By: Montey Hora Entered By: Montey Hora on 03/15/2015 08:26:36

## 2015-03-19 NOTE — Progress Notes (Addendum)
LAVAYAH, GRODY ([id number]) Visit Report for 03/15/2015 Chief Complaint Document Details Patient Name: [name], [name] Date of Service: 03/15/2015 8:00 AM Medical Record Number: [id number] Patient Account Number: [id number] Date of Birth/Sex: 03-23-1928 (81 y.o. Female) Treating RN: Primary Care Physician: Constance Goltz Other Clinician: Referring Physician: Constance Goltz Treating Physician/Extender: Benjaman Pott in Treatment: 34 Information Obtained from: Patient Chief Complaint Patient presents to the wound care center today with an open arterial ulcer on the left foot big toe and lateral heal. She also has some superficial ulcerations on the second and third toe dorsum. she has no family member with her today and she is a very poor historian and cannot give a proper history. As stated that she walks around with a walker. Electronic Signature(s) Signed: 03/16/2015 8:42:46 AM By: Gretta Cool RN, BSN, Kim RN, BSN Previous Signature: 03/15/2015 8:08:17 AM Version By: Judene Companion MD Entered By: Gretta Cool RN, BSN, Kim on 03/15/2015 08:26:18 [name], [name] ([id number]) -------------------------------------------------------------------------------- HPI Details Patient Name: [name] Date of Service: 03/15/2015 8:00 AM Medical Record Number: [id number] Patient Account Number: [id number] Date of Birth/Sex: [birth year] (81 y.o. Female) Treating RN: Primary Care Physician: Constance Goltz Other Clinician: Referring Physician: Constance Goltz Treating Physician/Extender: Frann Rider in Treatment: 79 History of Present Illness Location: left heel Quality: Patient reports experiencing a dull pain to affected area(s). Severity: Patient states wound are getting worse. Duration: Patient states that they are not certain how long the wound has been present. Timing: Pain in wound is Intermittent (comes and goes Context: The wound appeared gradually over time Associated Signs and Symptoms:  Patient reports having difficulty standing for long periods. HPI Description: This 81 year old patient is a poor historian and comes from a nursing home. Does not have any family members with her. Says she has a ulcer on the lateral part of her left foot and some new ones are there on her right foot too. She is unable to say how long she's had these. she does walk with a walker and this is limited most of the day. Does not recall if she has had any vascular studies done. I have reviewed an x-ray done of the left foot which basically shows osteoporosis but no evidence of fracture dislocation or osteomyelitis. 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 She seems to be doing better overall. 10/19/14 -- I understand she has gone to the vascular surgery and lab yesterday and has had all her tests done yesterday. She is doing fine otherwise and has had no fresh issues. 10/26/2014 she seems to be doing well and has no fresh problems and seems much brighter. 10/12/14 -- Taking her antibiotic and continues to do her dressings locally and she is helped by her skilled nursing. She seems to be doing fine and has no fresh issues. 10/19/14 -- she is doing fine and has had no fresh issues. She says her son to go for her vascular workup yesterday and she has been called back after 3 months. we will try and gather these reports to review what they said. 10/26/14 --I have been able to review notes from the vascular surgery office and these were dated from 10/18/2014. He patient was on her first postop visit and had Doppler ultrasounds done of her arteries. She had more than 50% stenosis of the right superficial femoral artery and  more than 50% stenosis in the left tibioperoneal trunk. She was status post a left lower extremity angiogram with  angioplasty of the peroneal and left superficial femoral arteries for a nonhealing left foot and ankle ulceration. note is made of the fact that the ABIs had not changed in the last month since her previous visit. The opinion of her provider was that she did not need any intervention at this time and they had done everything they could at the present time. They would continue on Plavix and antibiotics and see her back in 3 months for a another arterial duplex study of the lower extremities. [name], [name] ([id number]) 11/02/2014 -- she has spoken to her caregivers about coming for HBO 5 times per week for 6-8 weeks and they are agreeable about this and she is willing to undergo hyperbaric oxygen therapy. 11/16/2014 she has been worked up with a EKG and chest x-ray and these were within normal limits. As far as the investigations go she is ready for HBOT. we are awaiting some insurance clearance so that she can start on her hyperbaric oxygen therapy. 11/30/2014 she tolerated hyperbaric oxygen therapy fine and there were no issues with her blood glucose levels. As noted she is not a diabetic and most of her fingerstick blood glucose levels are inaccurate because of the Raynaud's phenomena. She normally has to have your lobe sticks to get any random glucose levels. I have also requested her primary care does a venous blood glucose draw to check her glucose level. 12/21/2014 -- she is tolerating hyperbaric oxygen very well and has overall made a lot of progress. Her left leg is looking much better. 01/11/2015 - No new complaints today. New ulceration on the dorsum of the right second toe noted today on physical exam. Patient unaware. No significant pain. No fever or chills. No significant drainage. Offloading with sage boots at night. Significantly improved with HBO. 01/19/2015 - the patient has had a x-ray of the right foot done at the nursing home but we still do not have the report and we are  awaiting this result. She is otherwise doing fine with her general health. 01/25/2015 -- X-ray of her right foot showed no acute fracture, dislocation, bony infection or osteomyelitis. The was some soft tissue swelling and osteopenia seen. 02/15/2015 -- due to some insurance issues we could not get her epifix and we are working with her company to get her this. She is doing fine otherwise and has no fresh issues. Electronic Signature(s) Signed: 03/15/2015 8:10:01 AM By: Judene Companion MD Signed: 03/19/2015 12:31:06 PM By: Christin Fudge MD, FACS Entered By: Judene Companion on 03/15/2015 08:10:01 [name] ([id number]) -------------------------------------------------------------------------------- Physical Exam Details Patient Name: [name] Date of Service: 03/15/2015 8:00 AM Medical Record Number: [id number] Patient Account Number: [id number] Date of Birth/Sex: Sep 15, 1927 (81 y.o. Female) Treating RN: Primary Care Physician: Constance Goltz Other Clinician: Referring Physician: Constance Goltz Treating Physician/Extender: Benjaman Pott in Treatment: 26 Electronic Signature(s) Signed: 03/16/2015 8:42:46 AM By: Gretta Cool RN, BSN, Kim RN, BSN Previous Signature: 03/15/2015 8:10:13 AM Version By: Judene Companion MD Entered By: Gretta Cool RN, BSN, Kim on 03/15/2015 08:26:35 [name] ([id number]) -------------------------------------------------------------------------------- Physician Orders Details Patient Name: [name] Date of Service: 03/15/2015 8:00 AM Medical Record Number: [id number] Patient Account Number: [id number] Date of Birth/Sex: [birth year] (81 y.o. 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 - be sure to clean out all remaining prisma Anesthetic Wound #6 Left Achilles o Topical Lidocaine 4% cream applied to wound bed prior to debridement Primary Wound Dressing Wound #6 Left Achilles o Prisma Ag - or collagen with silver equivalent - triple layer please Secondary Dressing Wound #6 Left Achilles o Boardered Foam Dressing Dressing Change Frequency Wound #6 Left Achilles o Change dressing every other day. Follow-up Appointments Wound #6 Left Achilles o Return Appointment in 1 week. Off-Loading Wound #6 Left Achilles o Other: - SAGE boots while sitting or lying. Do not wear while ambulating. [name], [name] ([id number]) Home Health Wound #6 Left Achilles o [place] Visits - [place] Nurse may visit PRN to address patientos wound care needs. o FACE TO FACE ENCOUNTER: MEDICARE and MEDICAID PATIENTS: I certify that this patient is under my care and that I had a face-to-face encounter that meets the physician face-to-face encounter requirements with this patient on this date. The encounter with the patient was in whole or in part for the following MEDICAL CONDITION: (primary reason for [place]) MEDICAL NECESSITY: I certify, that based on my findings, NURSING services are a medically necessary home health service. HOME BOUND STATUS: I certify that my clinical findings support that this patient is homebound (i.e., Due to illness or injury, pt requires aid of supportive devices such as crutches, cane, wheelchairs, walkers, the use of special transportation or the assistance of  another person to leave their place of residence. There is a normal inability to leave the home and doing so requires considerable and taxing effort. Other absences are for medical reasons / religious services and are infrequent or of short duration when for other reasons). o If current dressing causes regression in wound condition, may D/C ordered dressing product/s and apply Normal Saline Moist Dressing daily until next [place] / Other MD appointment. [place] of regression in wound condition at [phone]. o Please direct any NON-WOUND related issues/requests for orders to patient's Primary Care Physician Electronic Signature(s) Signed: 03/15/2015 5:11:52 PM By: Montey Hora Entered By: Montey Hora on 03/15/2015 08:23:54 [name] ([id number]) -------------------------------------------------------------------------------- Problem List Details Patient Name: [name] Date of Service: 03/15/2015 8:00 AM Medical Record Number: [id number] Patient Account Number: [id number] Date of Birth/Sex: [birth year] (81 y.o. Female) Treating RN: Primary Care Physician: Constance Goltz Other Clinician: Referring Physician: Constance Goltz Treating Physician/Extender: Benjaman Pott in Treatment: 18 Active Problems ICD-10 Encounter Code Description Active Date Diagnosis L97.422 Non-pressure chronic ulcer of left heel and midfoot with fat 09/12/2014 Yes layer exposed 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 09/12/2014 Yes of heel and midfoot I70.235 Atherosclerosis of native arteries of right leg with 09/12/2014 Yes ulceration of other part of foot Inactive Problems Resolved Problems ICD-10 Code Description Active Date Resolved Date L97.412 Non-pressure chronic ulcer of right heel and midfoot with 10/26/2014 10/26/2014 fat layer exposed S91.104A Unspecified open wound of right lesser toe(s) without 01/11/2015  01/11/2015 damage to nail, initial encounter Electronic Signature(s) Signed: 03/16/2015 8:42:46 AM By: Gretta Cool, RN, BSN, Kim RN, BSN Previous  Signature: 03/15/2015 8:07:34 AM Version By: Judene Companion MD Entered By: Gretta Cool RN, BSN, Kim on 03/15/2015 08:26:02 [name], [name] ([id number]) -------------------------------------------------------------------------------- Progress Note Details Patient Name: [name] Date of Service: 03/15/2015 8:00 AM Medical Record Number: [id number] Patient Account Number: [id number] Date of Birth/Sex: 08-12-1928 (81 y.o. Female) Treating RN: Primary Care Physician: Constance Goltz Other Clinician: Referring Physician: Constance Goltz Treating Physician/Extender: Benjaman Pott in Treatment: 23 Subjective Chief Complaint Information obtained from Patient Patient presents to the wound care center today with an open arterial ulcer on the left foot big toe and lateral heal. She also has some superficial ulcerations on the second and third toe dorsum. she has no family member with her today and she is a very poor historian and cannot give a proper history. As stated that she walks around with a walker. History of Present Illness (HPI) The following HPI elements were documented for the patient's wound: Location: left heel Quality: Patient reports experiencing a dull pain to affected area(s). Severity: Patient states wound are getting worse. Duration: Patient states that they are not certain how long the wound has been present. Timing: Pain in wound is Intermittent (comes and goes Context: The wound appeared gradually over time Associated Signs and Symptoms: Patient reports having difficulty standing for long periods. This 81 year old patient is a poor historian and comes from a nursing home. Does not have any family members with her. Says she has a ulcer on the lateral part of her left foot and some new ones are there on her right foot too. She is unable  to say how long she's had these. she does walk with a walker and this is limited most of the day. Does not recall if she has had any vascular studies done. I have reviewed an x-ray done of the left foot which basically shows osteoporosis but no evidence of fracture dislocation or osteomyelitis. 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 She seems to be doing better overall. 10/19/14 -- I understand she has gone to the vascular surgery and lab yesterday and has had all her tests done yesterday. She is doing fine otherwise and has had no fresh issues. 10/26/2014 she seems to be doing well and has no fresh problems and seems much brighter. 10/12/14 -- Taking her antibiotic and continues to do her dressings locally and she is helped by her skilled nursing. She seems to be doing fine and has no fresh issues. 10/19/14 -- she is doing fine and has had no fresh issues. She says her son to go for her vascular workup yesterday and she has been called back after 3 months. we will try and gather these reports to review what [name], [name] ([id number]) they said. 10/26/14 --I have been able to review notes from the vascular surgery office and these were dated from 10/18/2014. He patient was on her first postop visit and had Doppler ultrasounds done of her arteries. She had more than 50% stenosis of the right superficial femoral artery and more than 50% stenosis in the left tibioperoneal trunk. She was status post a left lower extremity angiogram with angioplasty of the peroneal and left superficial femoral arteries for a nonhealing left foot and ankle ulceration. note is made of the fact that the ABIs had not changed in the last month since her previous visit. The  opinion of her provider was that she did not need any intervention at this time  and they had done everything they could at the present time. They would continue on Plavix and antibiotics and see her back in 3 months for a another arterial duplex study of the lower extremities. 11/02/2014 -- she has spoken to her caregivers about coming for HBO 5 times per week for 6-8 weeks and they are agreeable about this and she is willing to undergo hyperbaric oxygen therapy. 11/16/2014 she has been worked up with a EKG and chest x-ray and these were within normal limits. As far as the investigations go she is ready for HBOT. we are awaiting some insurance clearance so that she can start on her hyperbaric oxygen therapy. 11/30/2014 she tolerated hyperbaric oxygen therapy fine and there were no issues with her blood glucose levels. As noted she is not a diabetic and most of her fingerstick blood glucose levels are inaccurate because of the Raynaud's phenomena. She normally has to have your lobe sticks to get any random glucose levels. I have also requested her primary care does a venous blood glucose draw to check her glucose level. 12/21/2014 -- she is tolerating hyperbaric oxygen very well and has overall made a lot of progress. Her left leg is looking much better. 01/11/2015 - No new complaints today. New ulceration on the dorsum of the right second toe noted today on physical exam. Patient unaware. No significant pain. No fever or chills. No significant drainage. Offloading with sage boots at night. Significantly improved with HBO. 01/19/2015 - the patient has had a x-ray of the right foot done at the nursing home but we still do not have the report and we are awaiting this result. She is otherwise doing fine with her general health. 01/25/2015 -- X-ray of her right foot showed no acute fracture, dislocation, bony infection or osteomyelitis. The was some soft tissue swelling and osteopenia seen. 02/15/2015 -- due to some insurance issues we could not get her epifix and we are working  with her company to get her this. She is doing fine otherwise and has no fresh issues. Objective Constitutional [name], [name] ([id number]) Vitals Time Taken: 8:14 AM, Height: 64 in, Weight: 180 lbs, BMI: 30.9, Temperature: 98.3 F, Pulse: 66 bpm, Respiratory Rate: 18 breaths/min, Blood Pressure: 149/53 mmHg. Integumentary (Hair, Skin) Wound #6 status is Open. Original cause of wound was Gradually Appeared. The wound is located on the Left Achilles. The wound measures 0.9cm length x 0.5cm width x 0.1cm depth; 0.353cm^2 area and 0.035cm^3 volume. The wound is limited to skin breakdown. There is no tunneling or undermining noted. There is a small amount of serous drainage noted. The wound margin is distinct with the outline attached to the wound base. There is large (67-100%) pink granulation within the wound bed. There is no necrotic tissue within the wound bed. The periwound skin appearance exhibited: Localized Edema, Moist, Hemosiderin Staining. The periwound skin appearance did not exhibit: Callus, Crepitus, Excoriation, Fluctuance, Friable, Induration, Rash, Scarring, Dry/Scaly, Maceration, Atrophie Blanche, Cyanosis, Ecchymosis, Mottled, Pallor, Rubor, Erythema. Periwound temperature was noted as No Abnormality. 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 Only one ulcer left on left heel. ! cm . Rx with collagen qod in  nursing home Electronic Signature(s) Signed: 03/27/2015 4:34:37 PM By: Lorine Bears RCP, RRT, CHT Previous Signature: 03/16/2015 8:42:46 AM Version By: Gretta Cool RN, BSN, Kim RN, BSN Previous Signature: 03/15/2015 8:25:18 AM Version By: Judene Companion MD Entered By: Lorine Bears on 03/27/2015 16:34:37 [name] ([id number]) -------------------------------------------------------------------------------- SuperBill Details Patient Name: [name] Date of Service: 03/15/2015 Medical Record Number: [id number] Patient Account Number: [id number] Date of Birth/Sex: 20-Jul-1928 (81 y.o. 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 I70.235 Atherosclerosis of native arteries of right leg with ulceration of other part of foot Facility Procedures CPT4 Code: [id number] Description: 99213 - WOUND CARE VISIT-LEV 3 EST PT Modifier: Quantity: 1 Physician Procedures CPT4 Code Description: [id number] - WC PHYS LEVEL 1 EST PT ICD-10 Description Diagnosis L97.422 Non-pressure chronic ulcer of left heel and midfoot wi Modifier: th fat layer expose Quantity: 1 d Electronic Signature(s) Signed: 03/16/2015 8:42:46 AM By: Gretta Cool, RN, BSN, Kim RN, BSN Previous Signature: 03/15/2015 8:26:23 AM Version By: Judene Companion MD Entered By: Gretta Cool, RN, BSN, Kim on 03/15/2015 08:28:46

## 2015-03-22 ENCOUNTER — Encounter: Payer: Medicare Other | Attending: Surgery | Admitting: Surgery

## 2015-03-22 DIAGNOSIS — L97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: Secondary | ICD-10-CM | POA: Insufficient documentation

## 2015-03-22 DIAGNOSIS — I70235 Atherosclerosis of native arteries of right leg with ulceration of other part of foot: Secondary | ICD-10-CM | POA: Insufficient documentation

## 2015-03-22 DIAGNOSIS — I70244 Atherosclerosis of native arteries of left leg with ulceration of heel and midfoot: Secondary | ICD-10-CM | POA: Diagnosis not present

## 2015-03-22 NOTE — Progress Notes (Addendum)
[name], [name] ([id number]) Visit Report for 03/22/2015 Chief Complaint Document Details Patient Name: [name], [name] Date of Service: 03/22/2015 11:45 AM Medical Record Number: [id number] Patient Account Number: [id number] Date of Birth/Sex: 1928-03-28 (81 y.o. Female) Treating RN: Primary Care Physician: Constance Goltz Other Clinician: Referring Physician: Constance Goltz Treating Physician/Extender: Frann Rider in Treatment: 30 Information Obtained from: Patient Chief Complaint Patient presents to the wound care center today with an open arterial ulcer on the left foot big toe and lateral heal. She also has some superficial ulcerations on the second and third toe dorsum. she has no family member with her today and she is a very poor historian and cannot give a proper history. As stated that she walks around with a walker. Electronic Signature(s) Signed: 03/22/2015 12:25:48 PM By: Christin Fudge MD, FACS Entered By: Christin Fudge on 03/22/2015 12:25:48 [name] ([id number]) -------------------------------------------------------------------------------- Debridement Details Patient Name: [name] Date of Service: 03/22/2015 11:45 AM Medical Record Number: [id number] Patient Account Number: [id number] Date of Birth/Sex: March 09, 1928 (81 y.o. Female) Treating RN: Primary Care Physician: Constance Goltz Other Clinician: Referring Physician: Constance Goltz Treating Physician/Extender: Frann Rider in Treatment: 27 Debridement Performed for Wound #6 Left Achilles Assessment: Performed By: Physician Pat Patrick., MD Debridement: Debridement Pre-procedure Yes Verification/Time Out Taken: Start Time: 12:20 Pain Control: Lidocaine 5% topical ointment Level: Skin/Subcutaneous Tissue Total Area Debrided (L x 0.6 (cm) x 0.5 (cm) = 0.3 (cm) W): Tissue and other Viable, Non-Viable, Fibrin/Slough, Subcutaneous material debrided: Instrument: Curette Bleeding:  Minimum Hemostasis Achieved: Pressure End Time: 12:22 Procedural Pain: 2 Post Procedural Pain: 0 Response to Treatment: Procedure was tolerated well Post Debridement Measurements of Total Wound Length: (cm) 0.6 Width: (cm) 0.5 Depth: (cm) 0.1 Volume: (cm) 0.024 Electronic Signature(s) Signed: 03/22/2015 12:25:41 PM By: Christin Fudge MD, FACS Entered By: Christin Fudge on 03/22/2015 12:25:41 [name] ([id number]) -------------------------------------------------------------------------------- HPI Details Patient Name: [name] Date of Service: 03/22/2015 11:45 AM Medical Record Number: [id number] Patient Account Number: [id number] Date of Birth/Sex: 01/15/1928 (81 y.o. Female) Treating RN: Primary Care Physician: Constance Goltz Other Clinician: Referring Physician: Constance Goltz Treating Physician/Extender: Frann Rider in Treatment: 24 History of Present Illness Location: left heel Quality: Patient reports experiencing a dull pain to affected area(s). Severity: Patient states wound are getting worse. Duration: Patient states that they are not certain how long the wound has been present. Timing: Pain in wound is Intermittent (comes and goes Context: The wound appeared gradually over time Associated Signs and Symptoms: Patient reports having difficulty standing for long periods. HPI Description: This 81 year old patient is a poor historian and comes from a nursing home. Does not have any family members with her. Says she has a ulcer on the lateral part of her left foot and some new ones are there on her right foot too. She is unable to say how long she's had these. she does walk with a walker and this is limited most of the day. Does not recall if she has had any vascular studies done. I have reviewed an x-ray done of the left foot which basically shows osteoporosis but no evidence of fracture dislocation or osteomyelitis. 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 She seems to be doing better overall. 10/19/14 -- I understand she has gone to the vascular surgery and lab yesterday and has  had all her tests done yesterday. She is doing fine otherwise and has had no fresh issues. 10/26/2014 she seems to be doing well and has no fresh problems and seems much brighter. 10/12/14 -- Taking her antibiotic and continues to do her dressings locally and she is helped by her skilled nursing. She seems to be doing fine and has no fresh issues. 10/19/14 -- she is doing fine and has had no fresh issues. She says her son to go for her vascular workup yesterday and she has been called back after 3 months. we will try and gather these reports to review what they said. 10/26/14 --I have been able to review notes from the vascular surgery office and these were dated from 10/18/2014. He patient was on her first postop visit and had Doppler ultrasounds done of her arteries. She had more than 50% stenosis of the right superficial femoral artery and more than 50% stenosis in the left tibioperoneal trunk. She was status post a left lower extremity angiogram with angioplasty of the peroneal and left superficial femoral arteries for a nonhealing left foot and ankle ulceration. note is made of the fact that the ABIs had not changed in the last month since her previous visit. The opinion of her provider was that she did not need any intervention at this time and they had done everything they could at the present time. They would continue on Plavix and antibiotics and see her back in 3 months for a another arterial duplex study of the lower extremities. [name], [name] ([id number]) 11/02/2014 -- she has spoken to her caregivers about coming for HBO 5 times per week for 6-8 weeks and they are agreeable about this and she  is willing to undergo hyperbaric oxygen therapy. 11/16/2014 she has been worked up with a EKG and chest x-ray and these were within normal limits. As far as the investigations go she is ready for HBOT. we are awaiting some insurance clearance so that she can start on her hyperbaric oxygen therapy. 11/30/2014 she tolerated hyperbaric oxygen therapy fine and there were no issues with her blood glucose levels. As noted she is not a diabetic and most of her fingerstick blood glucose levels are inaccurate because of the Raynaud's phenomena. She normally has to have your lobe sticks to get any random glucose levels. I have also requested her primary care does a venous blood glucose draw to check her glucose level. 12/21/2014 -- she is tolerating hyperbaric oxygen very well and has overall made a lot of progress. Her left leg is looking much better. 01/11/2015 - No new complaints today. New ulceration on the dorsum of the right second toe noted today on physical exam. Patient unaware. No significant pain. No fever or chills. No significant drainage. Offloading with sage boots at night. Significantly improved with HBO. 01/19/2015 - the patient has had a x-ray of the right foot done at the nursing home but we still do not have the report and we are awaiting this result. She is otherwise doing fine with her general health. 01/25/2015 -- X-ray of her right foot showed no acute fracture, dislocation, bony infection or osteomyelitis. The was some soft tissue swelling and osteopenia seen. 02/15/2015 -- due to some insurance issues we could not get her epifix and we are working with her company to get her this. She is doing fine otherwise and has no fresh issues. Electronic Signature(s) Signed: 03/22/2015 12:26:01 PM By: Christin Fudge MD, FACS Entered By: Christin Fudge on 03/22/2015  12:26:01 [name], [name] ([id number]) -------------------------------------------------------------------------------- Physical  Exam Details Patient Name: [name], [name] Date of Service: 03/22/2015 11:45 AM Medical Record Number: [id number] Patient Account Number: [id number] Date of Birth/Sex: 1927-09-18 (81 y.o. Female) Treating RN: Primary Care Physician: Constance Goltz Other Clinician: Referring Physician: Constance Goltz Treating Physician/Extender: Frann Rider in Treatment: 27 Constitutional . Pulse regular. Respirations normal and unlabored. Afebrile. . Eyes Nonicteric. Reactive to light. Ears, Nose, Mouth, and Throat Lips, teeth, and gums WNL.Marland Kitchen Moist mucosa without lesions . Neck supple and nontender. No palpable supraclavicular or cervical adenopathy. Normal sized without goiter. Respiratory WNL. No retractions.. Cardiovascular Pedal Pulses WNL. No clubbing, cyanosis or edema. Chest Breasts symmetical and no nipple discharge.. Breast tissue WNL, no masses, lumps, or tenderness.. Lymphatic No adneopathy. No adenopathy. No adenopathy. Musculoskeletal Adexa without tenderness or enlargement.. Digits and nails w/o clubbing, cyanosis, infection, petechiae, ischemia, or inflammatory conditions.. Integumentary (Hair, Skin) No suspicious lesions. No crepitus or fluctuance. No peri-wound warmth or erythema. No masses.Marland Kitchen Psychiatric Judgement and insight Intact.. No evidence of depression, anxiety, or agitation.. Notes She has minimal debris and slough over the wound and it needs some sharp debridement today. Electronic Signature(s) Signed: 03/22/2015 12:26:32 PM By: Christin Fudge MD, FACS Entered By: Christin Fudge on 03/22/2015 12:26:32 [name] ([id number]) -------------------------------------------------------------------------------- Physician Orders Details Patient Name: [name] Date of Service: 03/22/2015 11:45 AM Medical Record Number: [id number] Patient Account Number: [id number] Date of Birth/Sex: [birth year] (81 y.o. 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 - be sure to clean out all remaining prisma Anesthetic Wound #6 Left Achilles o Topical Lidocaine 4% cream applied to wound bed prior to debridement Primary Wound Dressing Wound #6 Left Achilles o Prisma Ag - or collagen with silver equivalent - triple layer please Secondary Dressing Wound #6 Left Achilles o Boardered Foam Dressing Dressing Change Frequency Wound #6 Left Achilles o Change dressing every other day. Follow-up Appointments Wound #6 Left Achilles o Return Appointment in 1 week. Off-Loading Wound #6 Left Achilles o Other: - SAGE boots while sitting or lying. Do not wear while ambulating. [name], [name] ([id number]) Home Health Wound #6 Left Achilles o Full Visits - Welcome Nurse may visit PRN to address patientos wound care needs. o FACE TO FACE ENCOUNTER: MEDICARE and MEDICAID PATIENTS: I certify that this patient is under my care and that I had a face-to-face encounter that meets the physician face-to-face encounter requirements with this patient on this date. The encounter with the patient was in whole or in part for the following MEDICAL CONDITION: (primary reason for [place]) MEDICAL NECESSITY: I certify, that based on my findings, NURSING services are a medically necessary home health service. HOME BOUND  STATUS: I certify that my clinical findings support that this patient is homebound (i.e., Due to illness or injury, pt requires aid of supportive devices such as crutches, cane, wheelchairs, walkers, the use of special transportation or the assistance of another person to leave their place  of residence. There is a normal inability to leave the home and doing so requires considerable and taxing effort. Other absences are for medical reasons / religious services and are infrequent or of short duration when for other reasons). o If current dressing causes regression in wound condition, may D/C ordered dressing product/s and apply Normal Saline Moist Dressing daily until next [place] / Other MD appointment. [place] of regression in wound condition at [phone]. o Please direct any NON-WOUND related issues/requests for orders to patient's Primary Care Physician Electronic Signature(s) Signed: 03/22/2015 12:33:46 PM By: Montey Hora Signed: 03/22/2015 3:04:50 PM By: Christin Fudge MD, FACS Entered By: Montey Hora on 03/22/2015 12:33:45 [name] ([id number]) -------------------------------------------------------------------------------- Problem List Details Patient Name: [name] Date of Service: 03/22/2015 11:45 AM Medical Record Number: [id number] Patient Account Number: [id number] Date of Birth/Sex: 02-Jan-1928 (81 y.o. Female) Treating RN: Primary Care Physician: Constance Goltz Other Clinician: Referring Physician: Constance Goltz Treating Physician/Extender: Frann Rider in Treatment: 75 Active Problems ICD-10 Encounter Code Description Active Date Diagnosis L97.422 Non-pressure chronic ulcer of left heel and midfoot with fat 09/12/2014 Yes layer exposed 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 09/12/2014 Yes of heel and midfoot I70.235 Atherosclerosis of native arteries of right leg with 09/12/2014  Yes ulceration of other part of foot Inactive Problems Resolved Problems ICD-10 Code Description Active Date Resolved Date L97.412 Non-pressure chronic ulcer of right heel and midfoot with 10/26/2014 10/26/2014 fat layer exposed S91.104A Unspecified open wound of right lesser toe(s) without 01/11/2015 01/11/2015 damage to nail, initial encounter Electronic Signature(s) Signed: 03/22/2015 12:24:31 PM By: Christin Fudge MD, FACS Entered By: Christin Fudge on 03/22/2015 12:24:31 [name] ([id number]) -------------------------------------------------------------------------------- Progress Note Details Patient Name: [name] Date of Service: 03/22/2015 11:45 AM Medical Record Number: [id number] Patient Account Number: [id number] Date of Birth/Sex: Oct 27, 1927 (81 y.o. Female) Treating RN: Primary Care Physician: Constance Goltz Other Clinician: Referring Physician: Constance Goltz Treating Physician/Extender: Frann Rider in Treatment: 56 Subjective Chief Complaint Information obtained from Patient Patient presents to the wound care center today with an open arterial ulcer on the left foot big toe and lateral heal. She also has some superficial ulcerations on the second and third toe dorsum. she has no family member with her today and she is a very poor historian and cannot give a proper history. As stated that she walks around with a walker. History of Present Illness (HPI) The following HPI elements were documented for the patient's wound: Location: left heel Quality: Patient reports experiencing a dull pain to affected area(s). Severity: Patient states wound are getting worse. Duration: Patient states that they are not certain how long the wound has been present. Timing: Pain in wound is Intermittent (comes and goes Context: The wound appeared gradually over time Associated Signs and Symptoms: Patient reports having difficulty standing for long periods. This 81 year old  patient is a poor historian and comes from a nursing home. Does not have any family members with her. Says she has a ulcer on the lateral part of her left foot and some new ones are there on her right foot too. She is unable to say how long she's had these. she does walk with a walker and this is limited most of the day. Does not recall if she has had any vascular studies done. I have reviewed an x-ray done of the left foot which basically shows osteoporosis but no evidence of fracture dislocation or osteomyelitis. 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 She seems to be doing better overall. 10/19/14 -- I understand she has gone to the vascular surgery and lab yesterday and has had all her tests done yesterday. She is doing fine otherwise and has had no fresh issues. 10/26/2014 she seems to be doing well and has no fresh problems and seems much brighter. 10/12/14 -- Taking her antibiotic and continues to do her dressings locally and she is helped by her skilled nursing. She seems to be doing fine and has no fresh issues. 10/19/14 -- she is doing fine and has had no fresh issues. She says her son to go for her vascular workup yesterday and she has been called back after 3 months. we will try and gather these reports to review what JARYIA, HYAMS ([id number]) they said. 10/26/14 --I have been able to review notes from the vascular surgery office and these were dated from 10/18/2014. He patient was on her first postop visit and had Doppler ultrasounds done of her arteries. She had more than 50% stenosis of the right superficial femoral artery and more than 50% stenosis in the left tibioperoneal trunk. She was status post a left lower extremity angiogram with angioplasty of the peroneal and left superficial femoral arteries for a  nonhealing left foot and ankle ulceration. note is made of the fact that the ABIs had not changed in the last month since her previous visit. The opinion of her provider was that she did not need any intervention at this time and they had done everything they could at the present time. They would continue on Plavix and antibiotics and see her back in 3 months for a another arterial duplex study of the lower extremities. 11/02/2014 -- she has spoken to her caregivers about coming for HBO 5 times per week for 6-8 weeks and they are agreeable about this and she is willing to undergo hyperbaric oxygen therapy. 11/16/2014 she has been worked up with a EKG and chest x-ray and these were within normal limits. As far as the investigations go she is ready for HBOT. we are awaiting some insurance clearance so that she can start on her hyperbaric oxygen therapy. 11/30/2014 she tolerated hyperbaric oxygen therapy fine and there were no issues with her blood glucose levels. As noted she is not a diabetic and most of her fingerstick blood glucose levels are inaccurate because of the Raynaud's phenomena. She normally has to have your lobe sticks to get any random glucose levels. I have also requested her primary care does a venous blood glucose draw to check her glucose level. 12/21/2014 -- she is tolerating hyperbaric oxygen very well and has overall made a lot of progress. Her left leg is looking much better. 01/11/2015 - No new complaints today. New ulceration on the dorsum of the right second toe noted today on physical exam. Patient unaware. No significant pain. No fever or chills. No significant drainage. Offloading with sage boots at night. Significantly improved with HBO. 01/19/2015 - the patient has had a x-ray of the right foot done at the nursing home but we still do not have the report and we are awaiting this result. She is otherwise doing fine with her general health. 01/25/2015 -- X-ray of her  right foot showed no acute fracture, dislocation, bony infection or osteomyelitis. The was some  soft tissue swelling and osteopenia seen. 02/15/2015 -- due to some insurance issues we could not get her epifix and we are working with her company to get her this. She is doing fine otherwise and has no fresh issues. Objective Constitutional [name], [name] ([id number]) Pulse regular. Respirations normal and unlabored. Afebrile. Vitals Time Taken: 12:12 PM, Height: 64 in, Weight: 180 lbs, BMI: 30.9, Temperature: 98.2 F, Pulse: 71 bpm, Respiratory Rate: 18 breaths/min, Blood Pressure: 135/57 mmHg. Eyes Nonicteric. Reactive to light. Ears, Nose, Mouth, and Throat Lips, teeth, and gums WNL.Marland Kitchen Moist mucosa without lesions . Neck supple and nontender. No palpable supraclavicular or cervical adenopathy. Normal sized without goiter. Respiratory WNL. No retractions.. Cardiovascular Pedal Pulses WNL. No clubbing, cyanosis or edema. Chest Breasts symmetical and no nipple discharge.. Breast tissue WNL, no masses, lumps, or tenderness.. Lymphatic No adneopathy. No adenopathy. No adenopathy. Musculoskeletal Adexa without tenderness or enlargement.. Digits and nails w/o clubbing, cyanosis, infection, petechiae, ischemia, or inflammatory conditions.Marland Kitchen Psychiatric Judgement and insight Intact.. No evidence of depression, anxiety, or agitation.. General Notes: She has minimal debris and slough over the wound and it needs some sharp debridement today. Integumentary (Hair, Skin) No suspicious lesions. No crepitus or fluctuance. No peri-wound warmth or erythema. No masses.. Wound #6 status is Open. Original cause of wound was Gradually Appeared. The wound is located on the Left Achilles. The wound measures 0.6cm length x 0.5cm width x 0.1cm depth; 0.236cm^2 area and 0.024cm^3 volume. The wound is limited to skin breakdown. There is no tunneling or undermining noted. There is a small amount of serous  drainage noted. The wound margin is distinct with the outline attached to the wound base. There is medium (34-66%) pink granulation within the wound bed. There is a medium (34- 66%) amount of necrotic tissue within the wound bed including Adherent Slough. The periwound skin appearance exhibited: Localized Edema, Moist, Hemosiderin Staining. The periwound skin appearance did not exhibit: Callus, Crepitus, Excoriation, Fluctuance, Friable, Induration, Rash, Scarring, Dry/Scaly, Maceration, Atrophie Blanche, Cyanosis, Ecchymosis, Mottled, Pallor, Rubor, Erythema. [name], [name] ([id number]) temperature was noted as No Abnormality. 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 She continues to offload her leg nicely, is doing the proper nutrition and vitamin supplements and we anticipate this will heal soon. She will come back and see as next week. Procedures Wound #6 Wound #6 is an Arterial Insufficiency Ulcer located on the Left Achilles . There was a Skin/Subcutaneous Tissue Debridement [id number]) debridement with total area of 0.3 sq cm performed by Pat Patrick., MD. with the following instrument(s): Curette to remove Viable and Non-Viable tissue/material including Fibrin/Slough and Subcutaneous after achieving pain control using Lidocaine 5% topical ointment. A time out was conducted prior to the start of the procedure. A Minimum amount of bleeding was controlled with Pressure. The procedure was tolerated well with a pain level of 2 throughout and a pain level of 0  following the procedure. Post Debridement Measurements: 0.6cm length x 0.5cm width x 0.1cm depth; 0.024cm^3 volume. Plan Wound Cleansing: Wound #6 Left Achilles: Clean wound with Normal Saline. - be sure to clean  out all remaining prisma Anesthetic: Wound #6 Left Achilles: [name], [name] ([id number]) Topical Lidocaine 4% cream applied to wound bed prior to debridement Primary Wound Dressing: Wound #6 Left Achilles: Prisma Ag - or collagen with silver equivalent - triple layer please Secondary Dressing: Wound #6 Left Achilles: Boardered Foam Dressing Dressing Change Frequency: Wound #6 Left Achilles: Change dressing every other day. Follow-up Appointments: Wound #6 Left Achilles: Return Appointment in 1 week. Off-Loading: Wound #6 Left Achilles: Other: - SAGE boots while sitting or lying. Do not wear while ambulating. Home Health: Wound #6 Left Achilles: Continue Home Health Visits - [facility] Nurse may visit PRN to address patient s wound care needs. FACE TO FACE ENCOUNTER: MEDICARE and MEDICAID PATIENTS: I certify that this patient is under my care and that I had a face-to-face encounter that meets the physician face-to-face encounter requirements with this patient on this date. The encounter with the patient was in whole or in part for the following MEDICAL CONDITION: (primary reason for [place]) MEDICAL NECESSITY: I certify, that based on my findings, NURSING services are a medically necessary home health service. HOME BOUND STATUS: I certify that my clinical findings support that this patient is homebound (i.e., Due to illness or injury, pt requires aid of supportive devices such as crutches, cane, wheelchairs, walkers, the use of special transportation or the assistance of another person to leave their place of residence. There is a normal inability to leave the home and doing so requires considerable and taxing effort. Other absences are for medical  reasons / religious services and are infrequent or of short duration when for other reasons). If current dressing causes regression in wound condition, may D/C ordered dressing product/s and apply Normal Saline Moist Dressing daily until next [place] / Other MD appointment. [place] of regression in wound condition at [phone]. Please direct any NON-WOUND related issues/requests for orders to patient's Primary Care Physician The wound had stalled a bit and had some debris and I have sharply debrided today and we will continue to use Prisma AG and a bordered foam. She continues to offload her leg nicely, is doing the proper nutrition and vitamin supplements and we anticipate this will heal soon. She will come back and see as next week. Electronic Signature(s) Signed: 03/23/2015 8:53:57 AM By: Christin Fudge MD, FACS Previous Signature: 03/22/2015 12:27:45 PM Version By: Christin Fudge MD, FACS [name], [name] ([id number]) Entered By: Christin Fudge on 03/23/2015 08:53:57 [name], [name] ([id number]) -------------------------------------------------------------------------------- SuperBill Details Patient Name: [name] Date of Service: 03/22/2015 Medical Record Number: [id number] Patient Account Number: [id number] Date of Birth/Sex: 1928-02-06 (81 y.o. 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 I70.235 Atherosclerosis of native arteries of right leg with ulceration of other part of foot Facility Procedures CPT4: Description Modifier Quantity Code [id number] - DEB SUBQ TISSUE 20 SQ CM/< 1 ICD-10  Description Diagnosis 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 of native arteries of right leg with ulceration of other part of foot Physician Procedures CPT4: Description Modifier Quantity Code [id number] - WC PHYS SUBQ  TISS 20 SQ CM 1 ICD-10 Description Diagnosis 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 of native arteries of right leg with ulceration of other part of foot Electronic Signature(s) Signed: 03/22/2015 12:27:55 PM By: Christin Fudge MD, FACS Entered By: Christin Fudge on 03/22/2015 12:27:55

## 2015-03-23 NOTE — Progress Notes (Signed)
JASMEET, GOUCH ([id number]) Visit Report for 03/22/2015 Arrival Information Details Patient Name: [name], [name] Date of Service: 03/22/2015 11:45 AM Medical Record Number: [id number] Patient Account Number: [id number] Date of Birth/Sex: [birth year] (81 y.o. Female) Treating RN: Montey Hora Primary Care Physician: Constance Goltz Other Clinician: Referring Physician: Constance Goltz Treating Physician/Extender: Frann Rider in Treatment: 69 Visit Information History Since Last Visit Added or deleted any medications: No Patient Arrived: Wheel Chair Any new allergies or adverse reactions: No Arrival Time: 12:11 Had a fall or experienced change in No Accompanied By: self activities of daily living that may affect Transfer Assistance: None risk of falls: Patient Identification Verified: Yes Signs or symptoms of abuse/neglect since last No Secondary Verification Process Yes visito Completed: Hospitalized since last visit: No Patient Requires Transmission- No Pain Present Now: No Based Precautions: Patient Has Alerts: Yes Patient Alerts: Patient on Blood Thinner Electronic Signature(s) Signed: 03/22/2015 3:55:41 PM By: Montey Hora Entered By: Montey Hora on 03/22/2015 12:11:40 [name] ([id number]) -------------------------------------------------------------------------------- Encounter Discharge Information Details Patient Name: [name] Date of Service: 03/22/2015 11:45 AM Medical Record Number: [id number] Patient Account Number: [id number] Date of Birth/Sex: Mar 11, 1928 (81 y.o. Female) Treating RN: Montey Hora Primary Care Physician: Constance Goltz Other Clinician: Referring Physician: Constance Goltz Treating Physician/Extender: Frann Rider in Treatment: 21 Encounter Discharge Information Items Discharge Pain Level: 0 Discharge Condition: Stable Ambulatory Status: Wheelchair Discharge Destination:  Home Private Transportation: Auto Accompanied By: self Schedule Follow-up Appointment: Yes Medication Reconciliation completed and No provided to Patient/Care Sharmel Ballantine: Clinical Summary of Care: Electronic Signature(s) Signed: 03/22/2015 12:35:14 PM By: Montey Hora Entered By: Montey Hora on 03/22/2015 12:35:14 [name] ([id number]) -------------------------------------------------------------------------------- Lower Extremity Assessment Details Patient Name: [name] Date of Service: 03/22/2015 11:45 AM Medical Record Number: [id number] Patient Account Number: [id number] Date of Birth/Sex: 02/22/1928 (81 y.o. Female) Treating RN: Montey Hora Primary Care Physician: Constance Goltz Other Clinician: Referring Physician: Constance Goltz Treating Physician/Extender: Frann Rider in Treatment: 27 Vascular Assessment Pulses: Posterior Tibial Dorsalis Pedis Palpable: [Left:No] Doppler: [Left:Monophasic] Extremity colors, hair growth, and conditions: Extremity Color: [Left:Normal] Hair Growth on Extremity: [Left:No] Temperature of Extremity: [Left:Warm] Capillary Refill: [Left:< 3 seconds] Toe Nail Assessment Left: Right: Thick: No Discolored: No Deformed: No Improper Length and Hygiene: No Electronic Signature(s) Signed: 03/22/2015 3:55:41 PM By: Montey Hora Entered By: Montey Hora on 03/22/2015 12:13:30 [name] ([id number]) -------------------------------------------------------------------------------- Multi Wound Chart Details Patient Name: [name] Date of Service: 03/22/2015 11:45 AM Medical Record Number: [id number] Patient Account Number: [id number] Date of Birth/Sex: September 18, 1927 (81 y.o. 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 N/A] Comorbid History: [6:Glaucoma, Asthma, Hypertension, Peripheral Venous Disease, Type II Diabetes] [N/A:N/A] Date Acquired: [6:09/26/2014] [N/A:N/A] Weeks of Treatment: [6:24] [N/A:N/A] Wound Status: [6:Open] [N/A:N/A] Measurements L x W x D 0.6x0.5x0.1 [N/A:N/A] (cm) Area (cm) : [6:0.236] [N/A:N/A] Volume (cm) : [6:0.024] [N/A:N/A] % Reduction in Area: [6:93.70%] [N/A:N/A] % Reduction in Volume: 98.70% [N/A:N/A] Classification: [6:Partial Thickness] [N/A:N/A] HBO Classification: [6:Grade 1] [N/A:N/A] Exudate Amount: [6:Small] [N/A:N/A] Exudate Type: [6:Serous] [N/A:N/A] Exudate Color: [6:amber] [N/A:N/A] Wound Margin: [6:Distinct, outline attached N/A] Granulation Amount: [6:Medium (34-66%)] [N/A:N/A] Granulation Quality: [6:Pink] [N/A:N/A] Necrotic Amount: [6:Medium (34-66%)] [N/A:N/A] Exposed Structures: [  6:Fascia: No Fat: No Tendon: No Muscle: No] [N/A:N/A] Joint: No Bone: No Limited to Skin Breakdown Epithelialization: Medium (34-66%) N/A N/A Debridement: Debridement [id number] N/A N/A 11047) Time-Out Taken: Yes N/A N/A Pain Control: Lidocaine 5% topical N/A N/A ointment Tissue Debrided: Fibrin/Slough, N/A N/A Subcutaneous Level: Skin/Subcutaneous N/A N/A Tissue Debridement Area (sq 0.3 N/A N/A cm): Instrument: Curette N/A N/A Bleeding: Minimum N/A N/A Hemostasis Achieved: Pressure N/A N/A Procedural Pain: 2 N/A N/A Post Procedural Pain: 0 N/A N/A Debridement Treatment Procedure was tolerated N/A N/A Response: well Post Debridement 0.6x0.5x0.1 N/A N/A Measurements L x W x D (cm) Post Debridement 0.024 N/A N/A Volume: (cm) Periwound Skin  Texture: Edema: Yes N/A N/A Excoriation: No Induration: No Callus: No Crepitus: No Fluctuance: No Friable: No Rash: No Scarring: No Periwound Skin Moist: Yes N/A N/A Moisture: Maceration: No Dry/Scaly: No Periwound Skin Color: Hemosiderin Staining: Yes N/A N/A Atrophie Blanche: No Cyanosis: No Ecchymosis: No Erythema: No Mottled: No Pallor: No Rubor: No Temperature: No Abnormality N/A N/A No N/A N/A WRIGLEY, REISIG ([id number]) Tenderness on Palpation: Wound Preparation: Ulcer Cleansing: N/A N/A Rinsed/Irrigated with Saline Topical Anesthetic Applied: Other: lidocaine 4% Procedures Performed: Debridement N/A N/A Treatment Notes Electronic Signature(s) Signed: 03/22/2015 12:33:20 PM By: Montey Hora Entered By: Montey Hora on 03/22/2015 12:33:20 [name] ([id number]) -------------------------------------------------------------------------------- Multi-Disciplinary Care Plan Details Patient Name: [name] Date of Service: 03/22/2015 11:45 AM Medical Record Number: [id number] Patient Account Number: [id number] Date of Birth/Sex: 02-25-1928 (81 y.o. Female) Treating RN: Montey Hora Primary Care Physician: Constance Goltz Other Clinician: Referring Physician: Constance Goltz Treating Physician/Extender: Frann Rider in Treatment: 53 Active Inactive Abuse / Safety / Falls / Self Care Management Nursing Diagnoses: Impaired physical mobility Potential for falls Goals: Patient will remain injury free Date Initiated: 09/12/2014 Goal Status: Active Patient/caregiver will verbalize understanding of skin care regimen Date Initiated: 09/12/2014 Goal Status: Active Patient/caregiver will verbalize/demonstrate measures taken to prevent injury and/or falls Date Initiated: 09/12/2014 Goal Status: Active Patient/caregiver will verbalize/demonstrate understanding of what to do in case of emergency Date Initiated: 09/12/2014 Goal Status:  Active Interventions: Assess fall risk on admission and as needed Assess: immobility, friction, shearing, incontinence upon admission and as needed Assess impairment of mobility on admission and as needed per policy Provide education on basic hygiene Provide education on fall prevention Provide education on personal and home safety Provide education on safe transfers Treatment Activities: Education provided on Basic Hygiene : 10/12/2014 Notes: Necrotic Tissue BAELEE, WOEHRLE ([id number]) Nursing Diagnoses: Impaired tissue integrity related to necrotic/devitalized tissue Knowledge deficit related to management of necrotic/devitalized tissue Goals: Necrotic/devitalized tissue will be minimized in the wound bed Date Initiated: 09/12/2014 Goal Status: Active Patient/caregiver will verbalize understanding of reason and process for debridement of necrotic tissue Date Initiated: 09/12/2014 Goal Status: Active Interventions: Assess patient pain level pre-, during and post procedure and prior to discharge Provide education on necrotic tissue and debridement process Treatment Activities: Apply topical anesthetic as ordered : 03/22/2015 Enzymatic debridement : 03/22/2015 Excisional debridement : 03/22/2015 Notes: Orientation to the Wound Care Program Nursing Diagnoses: Knowledge deficit related to the wound healing center program Goals: Patient/caregiver will verbalize understanding of the [facility] Program Date Initiated: 09/12/2014 Goal Status: Active Interventions: Provide education on orientation to the wound center Notes: Pressure Nursing Diagnoses: Knowledge deficit related to causes and risk factors for pressure ulcer development Knowledge deficit related to management of pressures ulcers Potential for impaired tissue integrity related to pressure, friction, moisture, and shear GoalsKACELYN, FIEST ([id number]) Patient  will remain free from development of additional  pressure ulcers Date Initiated: 09/12/2014 Goal Status: Active Patient will remain free of pressure ulcers Date Initiated: 09/12/2014 Goal Status: Active Patient/caregiver will verbalize risk factors for pressure ulcer development Date Initiated: 09/12/2014 Goal Status: Active Patient/caregiver will verbalize understanding of pressure ulcer management Date Initiated: 09/12/2014 Goal Status: Active Interventions: Assess: immobility, friction, shearing, incontinence upon admission and as needed Assess offloading mechanisms upon admission and as needed Assess potential for pressure ulcer upon admission and as needed Provide education on pressure ulcers Treatment Activities: Pressure reduction/relief device ordered : 03/22/2015 Notes: Wound/Skin Impairment Nursing Diagnoses: Impaired tissue integrity Knowledge deficit related to ulceration/compromised skin integrity Goals: Patient/caregiver will verbalize understanding of skin care regimen Date Initiated: 09/12/2014 Goal Status: Active Ulcer/skin breakdown will heal within 14 weeks Date Initiated: 09/12/2014 Goal Status: Active Interventions: Assess patient/caregiver ability to obtain necessary supplies Assess patient/caregiver ability to perform ulcer/skin care regimen upon admission and as needed Assess ulceration(s) every visit Provide education on ulcer and skin care Treatment Activities: Skin care regimen initiated : 03/22/2015 REIGHN, ADAMEC ([id number]) Topical wound management initiated : 03/22/2015 Notes: Electronic Signature(s) Signed: 03/22/2015 12:33:12 PM By: Montey Hora Entered By: Montey Hora on 03/22/2015 12:33:11 [name] ([id number]) -------------------------------------------------------------------------------- Patient/Caregiver Education Details Patient Name: [name] Date of Service: 03/22/2015 11:45 AM Medical Record Number: [id number] Patient Account Number: [id number] Date of Birth/Gender:  [birth year] (81 y.o. Female) Treating RN: Montey Hora Primary Care Physician: Constance Goltz Other Clinician: Referring Physician: Constance Goltz Treating Physician/Extender: Frann Rider in Treatment: 82 Education Assessment Education Provided To: Patient Education Topics Provided Offloading: Handouts: Other: continue offloading Methods: Explain/Verbal Responses: State content correctly Electronic Signature(s) Signed: 03/22/2015 12:35:31 PM By: Montey Hora Entered By: Montey Hora on 03/22/2015 12:35:31 [name] ([id number]) -------------------------------------------------------------------------------- Wound Assessment Details Patient Name: [name] Date of Service: 03/22/2015 11:45 AM Medical Record Number: [id number] Patient Account Number: [id number] Date of Birth/Sex: Nov 01, 1927 (81 y.o. Female) Treating RN: Montey Hora Primary Care Physician: Constance Goltz Other Clinician: Referring Physician: Constance Goltz Treating Physician/Extender: Frann Rider in Treatment: 27 Wound Status Wound Number: 6 Primary Arterial Insufficiency Ulcer Etiology: Wound Location: Left Achilles Wound Open Wounding Event: Gradually Appeared Status: Date Acquired: 09/26/2014 Comorbid Glaucoma, Asthma, Hypertension, Weeks Of Treatment: 24 History: Peripheral Venous Disease, Type II Clustered Wound: No Diabetes Photos Photo Uploaded By: Montey Hora on 03/22/2015 15:39:04 Wound Measurements Length: (cm) 0.6 Width: (cm) 0.5 Depth: (cm) 0.1 Area: (cm) 0.236 Volume: (cm) 0.024 % Reduction in Area: 93.7% % Reduction in Volume: 98.7% Epithelialization: Medium (34-66%) Tunneling: No Undermining: No Wound Description Classification: Partial Thickness Foul O Diabetic Severity (Wagner): Grade 1 Wound Margin: Distinct, outline attached Exudate Amount: Small Exudate Type: Serous Exudate Color: amber dor After Cleansing: No Wound Bed Granulation Amount:  Medium (34-66%) Exposed Structure Granulation Quality: Pink Fascia Exposed: No Necrotic Amount: Medium (34-66%) Fat Layer Exposed: No Dyer, Cris ([id number]) Necrotic Quality: Adherent Slough Tendon Exposed: No Muscle Exposed: No Joint Exposed: No Bone Exposed: No Limited to Skin Breakdown Periwound Skin Texture Texture Color No Abnormalities Noted: No No Abnormalities Noted: No Callus: No Atrophie Blanche: No Crepitus: No Cyanosis: No Excoriation: No Ecchymosis: No Fluctuance: No Erythema: No Friable: No Hemosiderin Staining: Yes Induration: No Mottled: No Localized Edema: Yes Pallor: No Rash: No Rubor: No Scarring: No Temperature / Pain Moisture Temperature: No Abnormality No Abnormalities Noted: No Dry / Scaly: No Maceration: No Moist: Yes Wound Preparation Ulcer Cleansing: Rinsed/Irrigated with Saline Topical Anesthetic Applied: Other: lidocaine 4%, Treatment  Notes Wound #6 (Left Achilles) 1. Cleansed with: Clean wound with Normal Saline 2. Anesthetic Topical Lidocaine 4% cream to wound bed prior to debridement 4. Dressing Applied: Prisma Ag 5. Secondary Dressing Applied Bordered Foam Dressing Electronic Signature(s) Signed: 03/22/2015 3:55:41 PM By: Montey Hora Entered By: Montey Hora on 03/22/2015 12:17:16 [name] ([id number]) -------------------------------------------------------------------------------- [place] Details Patient Name: [name] Date of Service: 03/22/2015 11:45 AM Medical Record Number: [id number] Patient Account Number: [id number] Date of Birth/Sex: 05/17/1928 (81 y.o. Female) Treating RN: Montey Hora Primary Care Physician: Constance Goltz Other Clinician: Referring Physician: Constance Goltz Treating Physician/Extender: Frann Rider in Treatment: 53 Vital Signs Time Taken: 12:12 Temperature (F): 98.2 Height (in): 64 Pulse (bpm): 71 Weight (lbs): 180 Respiratory Rate (breaths/min): 18 Body Mass  Index (BMI): 30.9 Blood Pressure (mmHg): 135/57 Reference Range: 80 - 120 mg / dl Electronic Signature(s) Signed: 03/22/2015 3:55:41 PM By: Montey Hora Entered By: Montey Hora on 03/22/2015 12:12:53

## 2015-03-29 ENCOUNTER — Encounter: Payer: Medicare Other | Admitting: Surgery

## 2015-03-29 DIAGNOSIS — L97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: Secondary | ICD-10-CM | POA: Diagnosis not present

## 2015-03-30 NOTE — Progress Notes (Signed)
[name], [name] ([id number]) Visit Report for 03/29/2015 Arrival Information Details Patient Name: [name], [name] Date of Service: 03/29/2015 10:15 AM Medical Record Number: [id number] Patient Account Number: [id number] Date of Birth/Sex: 12/20/1927 (81 y.o. Female) Treating RN: Montey Hora Primary Care Physician: Constance Goltz Other Clinician: Referring Physician: Constance Goltz Treating Physician/Extender: Frann Rider in Treatment: 28 Visit Information History Since Last Visit Added or deleted any medications: No Patient Arrived: Wheel Chair Any new allergies or adverse reactions: No Arrival Time: 10:22 Had a fall or experienced change in No Accompanied By: self activities of daily living that may affect Transfer Assistance: None risk of falls: Patient Identification Verified: Yes Signs or symptoms of abuse/neglect since last No Secondary Verification Process Yes visito Completed: Hospitalized since last visit: No Patient Requires Transmission- No Pain Present Now: No Based Precautions: Patient Has Alerts: Yes Patient Alerts: Patient on Blood Thinner Electronic Signature(s) Signed: 03/29/2015 5:44:32 PM By: Montey Hora Entered By: Montey Hora on 03/29/2015 10:22:45 [name] ([id number]) -------------------------------------------------------------------------------- Clinic Level of Care Assessment Details Patient Name: [name] Date of Service: 03/29/2015 10:15 AM Medical Record Number: [id number] Patient Account Number: [id number] Date of Birth/Sex: [birth year] (81 y.o. Female) Treating RN: Montey Hora Primary Care Physician: Constance Goltz Other Clinician: Referring Physician: Constance Goltz Treating Physician/Extender: Frann Rider in Treatment: 28 Clinic Level of Care Assessment Items TOOL 4 Quantity Score []  - Use when only an EandM is performed on FOLLOW-UP visit 0 ASSESSMENTS - Nursing Assessment / Reassessment X - Reassessment  of Co-morbidities (includes updates in patient status) 1 10 X - Reassessment of Adherence to Treatment Plan 1 5 ASSESSMENTS - Wound and Skin Assessment / Reassessment X - Simple Wound Assessment / Reassessment - one wound 1 5 []  - Complex Wound Assessment / Reassessment - multiple wounds 0 []  - Dermatologic / Skin Assessment (not related to wound area) 0 ASSESSMENTS - Focused Assessment []  - Circumferential Edema Measurements - multi extremities 0 []  - Nutritional Assessment / Counseling / Intervention 0 X - Lower Extremity Assessment (monofilament, tuning fork, pulses) 1 5 []  - Peripheral Arterial Disease Assessment (using hand held doppler) 0 ASSESSMENTS - Ostomy and/or Continence Assessment and Care []  - Incontinence Assessment and Management 0 []  - Ostomy Care Assessment and Management (repouching, etc.) 0 PROCESS - Coordination of Care X - Simple Patient / Family Education for ongoing care 1 15 []  - Complex (extensive) Patient / Family Education for ongoing care 0 []  - Staff obtains Programmer, systems, Records, Test Results / Process Orders 0 []  - Staff telephones HHA, Nursing Homes / Clarify orders / etc 0 []  - Routine Transfer to another Facility (non-emergent condition) 0 [name], [name] ([id number]) []  - Routine Hospital Admission (non-emergent condition) 0 []  - New Admissions / Biomedical engineer / Ordering NPWT, Apligraf, etc. 0 []  - Emergency Hospital Admission (emergent condition) 0 X - Simple Discharge Coordination 1 10 []  - Complex (extensive) Discharge Coordination 0 PROCESS - Special Needs []  - Pediatric / Minor Patient Management 0 []  - Isolation Patient Management 0 []  - Hearing / Language / Visual special needs 0 []  - Assessment of Community assistance (transportation, D/C planning, etc.) 0 []  - Additional assistance / Altered mentation 0 []  - Support Surface(s) Assessment (bed, cushion, seat, etc.) 0 INTERVENTIONS - Wound Cleansing / Measurement X - Simple Wound  Cleansing - one wound 1 5 []  - Complex Wound Cleansing - multiple wounds 0 X - Wound Imaging (photographs - any number of wounds) 1 5 []  - Wound Tracing (instead of  photographs) 0 X - Simple Wound Measurement - one wound 1 5 []  - Complex Wound Measurement - multiple wounds 0 INTERVENTIONS - Wound Dressings X - Small Wound Dressing one or multiple wounds 1 10 []  - Medium Wound Dressing one or multiple wounds 0 []  - Large Wound Dressing one or multiple wounds 0 []  - Application of Medications - topical 0 []  - Application of Medications - injection 0 INTERVENTIONS - Miscellaneous []  - External ear exam 0 [name], [name] ([id number]) []  - Specimen Collection (cultures, biopsies, blood, body fluids, etc.) 0 []  - Specimen(s) / Culture(s) sent or taken to Lab for analysis 0 []  - Patient Transfer (multiple staff / Harrel Lemon Lift / Similar devices) 0 []  - Simple Staple / Suture removal (25 or less) 0 []  - Complex Staple / Suture removal (26 or more) 0 []  - Hypo / Hyperglycemic Management (close monitor of Blood Glucose) 0 []  - Ankle / Brachial Index (ABI) - do not check if billed separately 0 X - Vital Signs 1 5 Has the patient been seen at the hospital within the last three years: Yes Total Score: 80 Level Of Care: New/Established - Level 3 Electronic Signature(s) Signed: 03/29/2015 5:44:32 PM By: Montey Hora Entered By: Montey Hora on 03/29/2015 10:39:28 [name] ([id number]) -------------------------------------------------------------------------------- Encounter Discharge Information Details Patient Name: [name] Date of Service: 03/29/2015 10:15 AM Medical Record Number: [id number] Patient Account Number: [id number] Date of Birth/Sex: [birth year] (81 y.o. Female) Treating RN: Montey Hora Primary Care Physician: Constance Goltz Other Clinician: Referring Physician: Constance Goltz Treating Physician/Extender: Frann Rider in Treatment: 20 Encounter Discharge  Information Items Discharge Pain Level: 0 Discharge Condition: Stable Ambulatory Status: Wheelchair Discharge Destination: Home Transportation: Private Auto Accompanied By: self Schedule Follow-up Appointment: Yes Medication Reconciliation completed and provided to Patient/Care No [name]: Provided on Clinical Summary of Care: 03/29/2015 Form Type Recipient Paper Patient DJ Electronic Signature(s) Signed: 03/29/2015 5:44:32 PM By: Montey Hora Previous Signature: 03/29/2015 10:46:45 AM Version By: Ruthine Dose Entered By: Montey Hora on 03/29/2015 10:49:41 [name] ([id number]) -------------------------------------------------------------------------------- Lower Extremity Assessment Details Patient Name: [name] Date of Service: 03/29/2015 10:15 AM Medical Record Number: [id number] Patient Account Number: [id number] Date of Birth/Sex: 07/07/1928 (81 y.o. Female) Treating RN: Montey Hora Primary Care Physician: Constance Goltz Other Clinician: Referring Physician: Constance Goltz Treating Physician/Extender: Frann Rider in Treatment: 28 Vascular Assessment Pulses: Posterior Tibial Dorsalis Pedis Palpable: [Left:No] Extremity colors, hair growth, and conditions: Extremity Color: [Left:Normal] Hair Growth on Extremity: [Left:No] Temperature of Extremity: [Left:Warm] Capillary Refill: [Left:< 3 seconds] Electronic Signature(s) Signed: 03/29/2015 5:44:32 PM By: Montey Hora Entered By: Montey Hora on 03/29/2015 10:26:26 [name] ([id number]) -------------------------------------------------------------------------------- Multi Wound Chart Details Patient Name: [name] Date of Service: 03/29/2015 10:15 AM Medical Record Number: [id number] Patient Account Number: [id number] Date of Birth/Sex: Nov 11, 1927 (81 y.o. 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] Comorbid History: [6:Glaucoma, Asthma, Hypertension, Peripheral Venous Disease, Type II Diabetes] [N/A:N/A] Date Acquired: [6:09/26/2014] [N/A:N/A] Weeks of Treatment: [6:25] [N/A:N/A] Wound Status: [6:Open] [N/A:N/A] Measurements L x W x D 0.6x0.7x0.1 [N/A:N/A] (cm) Area (cm) : [6:0.33] [N/A:N/A] Volume (cm) : [6:0.033] [N/A:N/A] % Reduction in Area: [6:91.20%] [N/A:N/A] %  Reduction in Volume: 98.20% [N/A:N/A] Classification: [6:Partial Thickness] [N/A:N/A] HBO Classification: [6:Grade 1] [N/A:N/A] Exudate Amount: [6:Small] [N/A:N/A] Exudate Type: [6:Serous] [N/A:N/A] Exudate Color: [6:amber] [N/A:N/A] Wound Margin: [6:Distinct, outline attached N/A] Granulation Amount: [6:Medium (34-66%)] [N/A:N/A] Granulation Quality: [6:Pink] [N/A:N/A] Necrotic Amount: [6:Medium (34-66%)] [N/A:N/A] Exposed Structures: [6:Fascia: No Fat: No Tendon: No Muscle: No] [N/A:N/A] Joint: No Bone: No Limited to Skin Breakdown Epithelialization: Medium (34-66%) N/A N/A Periwound Skin Texture: Edema: Yes N/A N/A Excoriation: No Induration: No Callus: No Crepitus: No Fluctuance: No Friable: No Rash: No Scarring: No Periwound Skin Moist: Yes N/A N/A Moisture: Maceration: No Dry/Scaly: No Periwound Skin Color: Hemosiderin Staining: Yes N/A N/A Atrophie Blanche: No Cyanosis: No Ecchymosis: No Erythema: No Mottled: No Pallor: No Rubor: No Temperature: No Abnormality N/A N/A Tenderness on No N/A N/A Palpation: Wound Preparation: Ulcer Cleansing: N/A  N/A Rinsed/Irrigated with Saline Topical Anesthetic Applied: Other: lidocaine 4% Treatment Notes Electronic Signature(s) Signed: 03/29/2015 5:44:32 PM By: Montey Hora Entered By: Montey Hora on 03/29/2015 10:30:12 [name] ([id number]) -------------------------------------------------------------------------------- Multi-Disciplinary Care Plan Details Patient Name: [name] Date of Service: 03/29/2015 10:15 AM Medical Record Number: [id number] Patient Account Number: [id number] Date of Birth/Sex: September 29, 1927 (81 y.o. Female) Treating RN: Montey Hora Primary Care Physician: Constance Goltz Other Clinician: Referring Physician: Constance Goltz Treating Physician/Extender: Frann Rider in Treatment: 37 Active Inactive Abuse / Safety / Falls / Self Care Management Nursing Diagnoses: Impaired physical mobility Potential for falls Goals: Patient will remain injury free Date Initiated: 09/12/2014 Goal Status: Active Patient/caregiver will verbalize understanding of skin care regimen Date Initiated: 09/12/2014 Goal Status: Active Patient/caregiver will verbalize/demonstrate measures taken to prevent injury and/or falls Date Initiated: 09/12/2014 Goal Status: Active Patient/caregiver will verbalize/demonstrate understanding of what to do in case of emergency Date Initiated: 09/12/2014 Goal Status: Active Interventions: Assess fall risk on admission and as needed Assess: immobility, friction, shearing, incontinence upon admission and as needed Assess impairment of mobility on admission and as needed per policy Provide education on basic hygiene Provide education on fall prevention Provide education on personal and home safety Provide education on safe transfers Treatment Activities: Education provided on Basic Hygiene : 10/12/2014 Notes: Necrotic Tissue [name], [name] ([id number]) Nursing Diagnoses: Impaired tissue integrity related to necrotic/devitalized  tissue Knowledge deficit related to management of necrotic/devitalized tissue Goals: Necrotic/devitalized tissue will be minimized in the wound bed Date Initiated: 09/12/2014 Goal Status: Active Patient/caregiver will verbalize understanding of reason and process for debridement of necrotic tissue Date Initiated: 09/12/2014 Goal Status: Active Interventions: Assess patient pain level pre-, during and post procedure and prior to discharge Provide education on necrotic tissue and debridement process Treatment Activities: Apply topical anesthetic as ordered : 03/29/2015 Enzymatic debridement : 03/29/2015 Excisional debridement : 03/29/2015 Notes: Orientation to the Wound Care Program Nursing Diagnoses: Knowledge deficit related to the wound healing center program Goals: Patient/caregiver will verbalize understanding of the [facility] Date Initiated: 09/12/2014 Goal Status: Active Interventions: Provide education on orientation to the wound center Notes: Pressure Nursing Diagnoses: Knowledge deficit related to causes and risk factors for pressure ulcer development Knowledge deficit related to management of pressures ulcers Potential for impaired tissue integrity related to pressure, friction, moisture, and shear GoalsRUTH, [name] ([id number]) Patient will remain free from development of additional pressure ulcers Date Initiated: 09/12/2014 Goal Status: Active Patient will remain free of pressure ulcers Date Initiated: 09/12/2014 Goal Status: Active Patient/caregiver will verbalize risk factors for pressure ulcer development Date Initiated: 09/12/2014 Goal Status: Active Patient/caregiver will verbalize understanding of pressure  ulcer management Date Initiated: 09/12/2014 Goal Status: Active Interventions: Assess: immobility, friction, shearing, incontinence upon admission and as needed Assess offloading mechanisms upon admission and as needed Assess potential  for pressure ulcer upon admission and as needed Provide education on pressure ulcers Treatment Activities: Pressure reduction/relief device ordered : 03/29/2015 Notes: Wound/Skin Impairment Nursing Diagnoses: Impaired tissue integrity Knowledge deficit related to ulceration/compromised skin integrity Goals: Patient/caregiver will verbalize understanding of skin care regimen Date Initiated: 09/12/2014 Goal Status: Active Ulcer/skin breakdown will heal within 14 weeks Date Initiated: 09/12/2014 Goal Status: Active Interventions: Assess patient/caregiver ability to obtain necessary supplies Assess patient/caregiver ability to perform ulcer/skin care regimen upon admission and as needed Assess ulceration(s) every visit Provide education on ulcer and skin care Treatment Activities: Skin care regimen initiated : 03/29/2015 [name], [name] ([id number]) Topical wound management initiated : 03/29/2015 Notes: Electronic Signature(s) Signed: 03/29/2015 5:44:32 PM By: Montey Hora Entered By: Montey Hora on 03/29/2015 10:30:04 [name] ([id number]) -------------------------------------------------------------------------------- Patient/Caregiver Education Details Patient Name: [name] Date of Service: 03/29/2015 10:15 AM Medical Record Number: [id number] Patient Account Number: [id number] Date of Birth/Gender: 02-14-1928 (81 y.o. Female) Treating RN: Montey Hora Primary Care Physician: Constance Goltz Other Clinician: Referring Physician: Constance Goltz Treating Physician/Extender: Frann Rider in Treatment: 34 Education Assessment Education Provided To: Patient Education Topics Provided Wound/Skin Impairment: Handouts: Other: new wound care product as ordered Methods: Demonstration, Explain/Verbal Responses: State content correctly Electronic Signature(s) Signed: 03/29/2015 5:44:32 PM By: Montey Hora Entered By: Montey Hora on 03/29/2015  10:49:59 [name] ([id number]) -------------------------------------------------------------------------------- Wound Assessment Details Patient Name: [name] Date of Service: 03/29/2015 10:15 AM Medical Record Number: [id number] Patient Account Number: [id number] Date of Birth/Sex: 1928-01-27 (81 y.o. Female) Treating RN: Montey Hora Primary Care Physician: Constance Goltz Other Clinician: Referring Physician: Constance Goltz Treating Physician/Extender: Frann Rider in Treatment: 28 Wound Status Wound Number: 6 Primary Arterial Insufficiency Ulcer Etiology: Wound Location: Left Achilles Wound Open Wounding Event: Gradually Appeared Status: Date Acquired: 09/26/2014 Comorbid Glaucoma, Asthma, Hypertension, Weeks Of Treatment: 25 History: Peripheral Venous Disease, Type II Clustered Wound: No Diabetes Photos Photo Uploaded By: Montey Hora on 03/29/2015 17:39:05 Wound Measurements Length: (cm) 0.6 Width: (cm) 0.7 Depth: (cm) 0.1 Area: (cm) 0.33 Volume: (cm) 0.033 % Reduction in Area: 91.2% % Reduction in Volume: 98.2% Epithelialization: Medium (34-66%) Tunneling: No Undermining: No Wound Description Classification: Partial Thickness Foul O Diabetic Severity (Wagner): Grade 1 Wound Margin: Distinct, outline attached Exudate Amount: Small Exudate Type: Serous Exudate Color: amber dor After Cleansing: No Wound Bed Granulation Amount: Medium (34-66%) Exposed Structure Granulation Quality: Pink Fascia Exposed: No Necrotic Amount: Medium (34-66%) Fat Layer Exposed: No [place], Teena ([id number]) Necrotic Quality: Adherent Slough Tendon Exposed: No Muscle Exposed: No Joint Exposed: No Bone Exposed: No Limited to Skin Breakdown Periwound Skin Texture Texture Color No Abnormalities Noted: No No Abnormalities Noted: No Callus: No Atrophie Blanche: No Crepitus: No Cyanosis: No Excoriation: No Ecchymosis: No Fluctuance: No Erythema:  No Friable: No Hemosiderin Staining: Yes Induration: No Mottled: No Localized Edema: Yes Pallor: No Rash: No Rubor: No Scarring: No Temperature / Pain Moisture Temperature: No Abnormality No Abnormalities Noted: No Dry / Scaly: No Maceration: No Moist: Yes Wound Preparation Ulcer Cleansing: Rinsed/Irrigated with Saline Topical Anesthetic Applied: Other: lidocaine 4%, Treatment Notes Wound #6 (Left Achilles) 1. Cleansed with: Clean wound with Normal Saline 2. Anesthetic Topical Lidocaine 4% cream to wound bed prior to debridement 4. Dressing Applied: Other dressing (specify in notes) 5. Secondary Dressing Applied Bordered Foam Dressing Notes endoform Electronic  Signature(s) Signed: 03/29/2015 5:44:32 PM By: Montey Hora Entered By: Montey Hora on 03/29/2015 10:29:53 [name] ([id number]) -------------------------------------------------------------------------------- Vitals Details Patient Name: [name] Date of Service: 03/29/2015 10:15 AM Medical Record Number: [id number] Patient Account Number: [id number] Date of Birth/Sex: Dec 12, 1927 (81 y.o. Female) Treating RN: Montey Hora Primary Care Physician: Constance Goltz Other Clinician: Referring Physician: Constance Goltz Treating Physician/Extender: Frann Rider in Treatment: 28 Vital Signs Time Taken: 10:22 Temperature (F): 98.3 Height (in): 64 Pulse (bpm): 61 Weight (lbs): 180 Respiratory Rate (breaths/min): 18 Body Mass Index (BMI): 30.9 Blood Pressure (mmHg): 127/51 Reference Range: 80 - 120 mg / dl Electronic Signature(s) Signed: 03/29/2015 5:44:32 PM By: Montey Hora Entered By: Montey Hora on 03/29/2015 10:25:32

## 2015-03-30 NOTE — Progress Notes (Addendum)
[name] ([id number]) Visit Report for 03/29/2015 Chief Complaint Document Details Patient Name: [name], [name] Date of Service: 03/29/2015 10:15 AM Medical Record Number: [id number] Patient Account Number: [id number] Date of Birth/Sex: Jul 02, 1928 (81 y.o. Female) Treating RN: [name] Primary Care Physician: [name] Other Clinician: Referring Physician: Constance [name] Treating Physician/Extender: [name] in Treatment: 28 Information Obtained from: Patient Chief Complaint Patient presents to the wound care center today with an open arterial ulcer on the left foot big toe and lateral heal. She also has some superficial ulcerations on the second and third toe dorsum. she has no family member with her today and she is a very poor historian and cannot give a proper history. As stated that she walks around with a walker. Electronic Signature(s) Signed: 03/29/2015 10:40:59 AM By: [name] Fudge MD, FACS Entered By: [name] on 03/29/2015 10:40:59 [name] ([id number]) -------------------------------------------------------------------------------- HPI Details Patient Name: [name] Date of Service: 03/29/2015 10:15 AM Medical Record Number: [id number] Patient Account Number: [id number] Date of Birth/Sex: September 29, 1927 (81 y.o. Female) Treating RN: [name] Primary Care Physician: [name] Other Clinician: Referring Physician: Constance [name] Treating Physician/Extender: [name] in Treatment: 28 History of Present Illness Location: left heel Quality: Patient reports experiencing a dull pain to affected area(s). Severity: Patient states wound are getting worse. Duration: Patient states that they are not certain how long the wound has been present. Timing: Pain in wound is Intermittent (comes and goes Context: The wound appeared gradually over time Associated Signs and Symptoms: Patient reports having difficulty standing for  long periods. HPI Description: This 81 year old patient is a poor historian and comes from a nursing home. Does not have any family members with her. Says she has a ulcer on the lateral part of her left foot and some new ones are there on her right foot too. She is unable to say how long she's had these. she does walk with a walker and this is limited most of the day. Does not recall if she has had any vascular studies done. I have reviewed an x-ray done of the left foot which basically shows osteoporosis but no evidence of fracture dislocation or osteomyelitis. 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 She seems to be doing better overall. 10/19/14 -- I understand she has gone to the vascular surgery and lab yesterday and has had all her tests done yesterday. She is doing fine otherwise and has had no fresh issues. 10/26/2014 she seems to be doing well and has no fresh problems and seems much brighter. 10/12/14 -- Taking her antibiotic and continues to do her dressings locally and she is helped by her skilled nursing. She seems to be doing fine and has no fresh issues. 10/19/14 -- she is doing fine and has had no fresh issues. She says her son to go for her vascular workup yesterday and she has been called back after 3 months. we will try and gather these reports to review what they said. 10/26/14 --I have been able to review notes from the vascular surgery office and these were dated from 10/18/2014. He patient was on her first postop visit and had Doppler ultrasounds done of her arteries. She had more than 50% stenosis of the right superficial femoral artery and more than 50% stenosis in the left tibioperoneal trunk. She  was status post a left lower extremity angiogram with angioplasty of the peroneal and left  superficial femoral arteries for a nonhealing left foot and ankle ulceration. note is made of the fact that the ABIs had not changed in the last month since her previous visit. The opinion of her provider was that she did not need any intervention at this time and they had done everything they could at the present time. They would continue on Plavix and antibiotics and see her back in 3 months for a another arterial duplex study of the lower extremities. [name] ([id number]) 11/02/2014 -- she has spoken to her caregivers about coming for HBO 5 times per week for 6-8 weeks and they are agreeable about this and she is willing to undergo hyperbaric oxygen therapy. 11/16/2014 she has been worked up with a EKG and chest x-ray and these were within normal limits. As far as the investigations go she is ready for HBOT. we are awaiting some insurance clearance so that she can start on her hyperbaric oxygen therapy. 11/30/2014 she tolerated hyperbaric oxygen therapy fine and there were no issues with her blood glucose levels. As noted she is not a diabetic and most of her fingerstick blood glucose levels are inaccurate because of the Raynaud's phenomena. She normally has to have your lobe sticks to get any random glucose levels. I have also requested her primary care does a venous blood glucose draw to check her glucose level. 12/21/2014 -- she is tolerating hyperbaric oxygen very well and has overall made a lot of progress. Her left leg is looking much better. 01/11/2015 - No new complaints today. New ulceration on the dorsum of the right second toe noted today on physical exam. Patient unaware. No significant pain. No fever or chills. No significant drainage. Offloading with sage boots at night. Significantly improved with HBO. 01/19/2015 - the patient has had a x-ray of the right foot done at the nursing home but we still do not have the report and we are awaiting this result. She is otherwise  doing fine with her general health. 01/25/2015 -- X-ray of her right foot showed no acute fracture, dislocation, bony infection or osteomyelitis. The was some soft tissue swelling and osteopenia seen. 02/15/2015 -- due to some insurance issues we could not get her epifix and we are working with her company to get her this. She is doing fine otherwise and has no fresh issues. Electronic Signature(s) Signed: 03/29/2015 10:41:09 AM By: [name] Fudge MD, FACS Entered By: [name] on 03/29/2015 10:41:09 [name] ([id number]) -------------------------------------------------------------------------------- Physical Exam Details Patient Name: [name] Date of Service: 03/29/2015 10:15 AM Medical Record Number: [id number] Patient Account Number: [id number] Date of Birth/Sex: 1927-10-26 (81 y.o. Female) Treating RN: [name] Primary Care Physician: [name] Other Clinician: Referring Physician: Constance [name] Treating Physician/Extender: [name] in Treatment: 28 Constitutional . Pulse regular. Respirations normal and unlabored. Afebrile. . Eyes Nonicteric. Reactive to light. Ears, Nose, Mouth, and Throat Lips, teeth, and gums WNL.Marland Kitchen Moist mucosa without lesions . Neck supple and nontender. No palpable supraclavicular or cervical adenopathy. Normal sized without goiter. Respiratory WNL. No retractions.. Cardiovascular Pedal Pulses WNL. No clubbing, cyanosis or edema. Chest Breasts symmetical and no nipple discharge.. Breast tissue WNL, no masses, lumps, or tenderness.. Lymphatic No adneopathy. No adenopathy. No adenopathy. Musculoskeletal Adexa without tenderness or enlargement.. Digits and nails w/o clubbing, cyanosis, infection, petechiae, ischemia, or inflammatory conditions.. Integumentary (Hair, Skin) No suspicious lesions. No crepitus or fluctuance. No peri-wound  warmth or erythema. No masses.Marland Kitchen Psychiatric Judgement and insight Intact.. No  evidence of depression, anxiety, or agitation.. Notes the wound looks clean and there is healthy granulation tissue and the surrounding skin is normal. Electronic Signature(s) Signed: 03/29/2015 10:41:55 AM By: [name] Fudge MD, FACS Entered By: [name] on 03/29/2015 10:41:55 [name] ([id number]) -------------------------------------------------------------------------------- Physician Orders Details Patient Name: [name] Date of Service: 03/29/2015 10:15 AM Medical Record Number: [id number] Patient Account Number: [id number] Date of Birth/Sex: 10/03/1927 (81 y.o. Female) Treating RN: [name] Primary Care Physician: [name] Other Clinician: Referring Physician: Constance [name] Treating Physician/Extender: [name] in Treatment: 60 Verbal / Phone Orders: Yes Clinician: Montey [name] Read Back and Verified: Yes Diagnosis Coding Wound Cleansing Wound #6 Left Achilles o Clean wound with Normal Saline. - be sure to clean out all remaining prisma Anesthetic Wound #6 Left Achilles o Topical Lidocaine 4% cream applied to wound bed prior to debridement Primary Wound Dressing Wound #6 Left Achilles o Other: - endoform - cut a wound sized piece and moisten with saline and apply to wound bed - this product was given to patient Secondary Dressing Wound #6 Left Achilles o Boardered Foam Dressing Dressing Change Frequency Wound #6 Left Achilles o Change dressing every other day. Follow-up Appointments Wound #6 Left Achilles o Return Appointment in 1 week. Off-Loading Wound #6 Left Achilles o Other: - SAGE boots while sitting or lying. Do not wear while ambulating. Home Health Wound #6 Left Achilles o Continue Home Health Visits - [place] Nurse may visit PRN to address patientos wound care needs. o FACE TO FACE ENCOUNTER: MEDICARE and MEDICAID PATIENTS: I certify that this patient is under my care and that I  had a face-to-face encounter that meets the physician face-to-face MOLLEY, BONIFACIO ([id number]) encounter requirements with this patient on this date. The encounter with the patient was in whole or in part for the following MEDICAL CONDITION: (primary reason for [place]) MEDICAL NECESSITY: I certify, that based on my findings, NURSING services are a medically necessary home health service. HOME BOUND STATUS: I certify that my clinical findings support that this patient is homebound (i.e., Due to illness or injury, pt requires aid of supportive devices such as crutches, cane, wheelchairs, walkers, the use of special transportation or the assistance of another person to leave their place of residence. There is a normal inability to leave the home and doing so requires considerable and taxing effort. Other absences are for medical reasons / religious services and are infrequent or of short duration when for other reasons). o If current dressing causes regression in wound condition, may D/C ordered dressing product/s and apply Normal Saline Moist Dressing daily until next [place] / Other MD appointment. [place] of regression in wound condition at [phone]. o Please direct any NON-WOUND related issues/requests for orders to patient's Primary Care Physician Electronic Signature(s) Signed: 03/29/2015 10:57:01 AM By: [name] Signed: 03/29/2015 1:29:04 PM By: [name] Fudge MD, FACS Entered By: [name] on 03/29/2015 10:57:01 [name] ([id number]) -------------------------------------------------------------------------------- Problem List Details Patient Name: [name] Date of Service: 03/29/2015 10:15 AM Medical Record Number: [id number] Patient Account Number: [id number] Date of Birth/Sex: January 18, 1928 (81 y.o. 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 09/12/2014 Yes of heel and midfoot I70.235 Atherosclerosis of native arteries of right leg with 09/12/2014 Yes ulceration of other part of foot Inactive Problems Resolved Problems ICD-10 Code Description Active Date Resolved Date L97.412 Non-pressure chronic ulcer of right heel and midfoot with 10/26/2014 10/26/2014 fat layer exposed S91.104A Unspecified open wound of right lesser toe(s) without 01/11/2015 01/11/2015 damage to nail, initial encounter Electronic Signature(s) Signed: 03/29/2015 10:40:51 AM By: [name] Fudge MD, FACS Entered By: [name] on 03/29/2015 10:40:51 [name] ([id number]) -------------------------------------------------------------------------------- Progress Note Details Patient Name: [name] Date of Service: 03/29/2015 10:15 AM Medical Record Number: [id number] Patient Account Number: [id number] Date of Birth/Sex: Sep 07, 1927 (81 y.o. Female) Treating RN: [name] Primary Care Physician: [name] Other Clinician: Referring Physician: Constance [name] Treating Physician/Extender: [name] in Treatment: 28 Subjective Chief Complaint Information obtained from Patient Patient presents to the wound care center today with an open arterial ulcer on the left foot big toe and lateral heal. She also has some superficial ulcerations on the second and third toe dorsum. she has no family member with her today and she is a very poor historian and cannot give a proper history. As stated that she walks around with a walker. History of Present Illness (HPI) The following HPI elements were documented for the patient's  wound: Location: left heel Quality: Patient reports experiencing a dull pain to affected area(s). Severity: Patient states wound are getting worse. Duration: Patient states that they are not certain how long the wound has been present. Timing: Pain in wound is Intermittent (comes and goes Context: The wound appeared gradually over time Associated Signs and Symptoms: Patient reports having difficulty standing for long periods. This 81 year old patient is a poor historian and comes from a nursing home. Does not have any family members with her. Says she has a ulcer on the lateral part of her left foot and some new ones are there on her right foot too. She is unable to say how long she's had these. she does walk with a walker and this is limited most of the day. Does not recall if she has had any vascular studies done. I have reviewed an x-ray done of the left foot which basically shows osteoporosis but no evidence of fracture dislocation or osteomyelitis. 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 She seems to be doing better overall. 10/19/14 -- I understand she has gone to the vascular surgery and lab yesterday and has had all her tests done yesterday. She is doing fine otherwise and has had no fresh issues. 10/26/2014 she seems to be doing well and has no fresh problems and seems much brighter. 10/12/14 -- Taking her antibiotic and continues to do her dressings locally and she is helped by her skilled nursing. She seems to be doing fine and has no fresh issues. 10/19/14 -- she is doing fine and has had no fresh issues. She says her son to go for her vascular workup yesterday and she has been called back after 3 months. we will try and gather these reports to review what SHARRY, GERVASIO ([id number]) they said. 10/26/14 --I have  been able to review notes from the vascular surgery office and these were dated from 10/18/2014. He patient was on her first  postop visit and had Doppler ultrasounds done of her arteries. She had more than 50% stenosis of the right superficial femoral artery and more than 50% stenosis in the left tibioperoneal trunk. She was status post a left lower extremity angiogram with angioplasty of the peroneal and left superficial femoral arteries for a nonhealing left foot and ankle ulceration. note is made of the fact that the ABIs had not changed in the last month since her previous visit. The opinion of her provider was that she did not need any intervention at this time and they had done everything they could at the present time. They would continue on Plavix and antibiotics and see her back in 3 months for a another arterial duplex study of the lower extremities. 11/02/2014 -- she has spoken to her caregivers about coming for HBO 5 times per week for 6-8 weeks and they are agreeable about this and she is willing to undergo hyperbaric oxygen therapy. 11/16/2014 she has been worked up with a EKG and chest x-ray and these were within normal limits. As far as the investigations go she is ready for HBOT. we are awaiting some insurance clearance so that she can start on her hyperbaric oxygen therapy. 11/30/2014 she tolerated hyperbaric oxygen therapy fine and there were no issues with her blood glucose levels. As noted she is not a diabetic and most of her fingerstick blood glucose levels are inaccurate because of the Raynaud's phenomena. She normally has to have your lobe sticks to get any random glucose levels. I have also requested her primary care does a venous blood glucose draw to check her glucose level. 12/21/2014 -- she is tolerating hyperbaric oxygen very well and has overall made a lot of progress. Her left leg is looking much better. 01/11/2015 - No new complaints today. New ulceration on the  dorsum of the right second toe noted today on physical exam. Patient unaware. No significant pain. No fever or chills. No significant drainage. Offloading with sage boots at night. Significantly improved with HBO. 01/19/2015 - the patient has had a x-ray of the right foot done at the nursing home but we still do not have the report and we are awaiting this result. She is otherwise doing fine with her general health. 01/25/2015 -- X-ray of her right foot showed no acute fracture, dislocation, bony infection or osteomyelitis. The was some soft tissue swelling and osteopenia seen. 02/15/2015 -- due to some insurance issues we could not get her epifix and we are working with her company to get her this. She is doing fine otherwise and has no fresh issues. Objective Constitutional EUNICE, ZERN ([id number]) Pulse regular. Respirations normal and unlabored. Afebrile. Vitals Time Taken: 10:22 AM, Height: 64 in, Weight: 180 lbs, BMI: 30.9, Temperature: 98.3 F, Pulse: 61 bpm, Respiratory Rate: 18 breaths/min, Blood Pressure: 127/51 mmHg. Eyes Nonicteric. Reactive to light. Ears, Nose, Mouth, and Throat Lips, teeth, and gums WNL.Marland Kitchen Moist mucosa without lesions . Neck supple and nontender. No palpable supraclavicular or cervical adenopathy. Normal sized without goiter. Respiratory WNL. No retractions.. Cardiovascular Pedal Pulses WNL. No clubbing, cyanosis or edema. Chest Breasts symmetical and no nipple discharge.. Breast tissue WNL, no masses, lumps, or tenderness.. Lymphatic No adneopathy. No adenopathy. No adenopathy. Musculoskeletal Adexa without tenderness or enlargement.. Digits and nails w/o clubbing, cyanosis, infection, petechiae, ischemia, or inflammatory conditions.Marland Kitchen Psychiatric Judgement and insight Intact.. No evidence of depression, anxiety, or agitation.. General Notes: the wound looks clean and there is healthy granulation tissue and the surrounding  skin  is normal. Integumentary (Hair, Skin) No suspicious lesions. No crepitus or fluctuance. No peri-wound warmth or erythema. No masses.. Wound #6 status is Open. Original cause of wound was Gradually Appeared. The wound is located on the Left Achilles. The wound measures 0.6cm length x 0.7cm width x 0.1cm depth; 0.33cm^2 area and 0.033cm^3 volume. The wound is limited to skin breakdown. There is no tunneling or undermining noted. There is a small amount of serous drainage noted. The wound margin is distinct with the outline attached to the wound base. There is medium (34-66%) pink granulation within the wound bed. There is a medium (34- 66%) amount of necrotic tissue within the wound bed including Adherent Slough. The periwound skin appearance exhibited: Localized Edema, Moist, Hemosiderin Staining. The periwound skin appearance did not exhibit: Callus, Crepitus, Excoriation, Fluctuance, Friable, Induration, Rash, Scarring, Dry/Scaly, Maceration, Atrophie Blanche, Cyanosis, Ecchymosis, Mottled, Pallor, Rubor, Erythema. Dalton, Sharisa ([id number]) temperature was noted as No Abnormality. 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 Plan Wound Cleansing: Wound #6 Left Achilles: Clean wound with Normal Saline. - be sure to clean out all remaining prisma Anesthetic: Wound #6 Left Achilles: Topical Lidocaine 4% cream applied to wound bed prior to debridement Primary Wound Dressing: Wound #6 Left Achilles: Other: - endoform - cut a wound sized  piece and moisten with saline and apply to wound bed - this product was given to patient Secondary Dressing: Wound #6 Left Achilles: Boardered Foam Dressing Dressing Change Frequency: Wound #6 Left Achilles: Change dressing every other day. Follow-up Appointments: Wound #6 Left Achilles: Return Appointment in 1 week. Off-Loading: Wound #6 Left Achilles: Other: - SAGE boots while sitting or lying. Do not wear while ambulating. Home Health: Wound #6 Left Achilles: Continue Home Health Visits - Evani Burdi, [place] ([id number]) Home Health Nurse may visit PRN to address patient s wound care needs. FACE TO FACE ENCOUNTER: MEDICARE and MEDICAID PATIENTS: I certify that this patient is under my care and that I had a face-to-face encounter that meets the physician face-to-face encounter requirements with this patient on this date. The encounter with the patient was in whole or in part for the following MEDICAL CONDITION: (primary reason for [place]) MEDICAL NECESSITY: I certify, that based on my findings, NURSING services are a medically necessary home health service. HOME BOUND STATUS: I certify that my clinical findings support that this patient is homebound (i.e., Due to illness or injury, pt requires aid of supportive devices such as crutches, cane, wheelchairs, walkers, the use of special transportation or the assistance of another person to leave their place of residence. There is a normal inability to leave the home and doing so requires considerable and taxing effort. Other absences are for medical reasons / religious services and are infrequent or of short duration when for other reasons). If current dressing causes regression in wound condition, may D/C ordered dressing product/s and apply Normal Saline Moist Dressing daily until next [place] / Other MD appointment. [place] of regression in wound condition at  [phone]. Please direct  any NON-WOUND related issues/requests for orders to patient's Primary Care Physician We will change over to Endoform to be applied on alternate days and see how she does with this.I have asked her to continue to offload as much as possible and see me back next week. Electronic Signature(s) Signed: 03/30/2015 4:26:44 PM By: [name] Fudge MD, FACS Previous Signature: 03/29/2015 10:43:36 AM Version By: [name] Fudge MD, FACS Entered By: [name] on 03/30/2015 16:26:44 [name] ([id number]) -------------------------------------------------------------------------------- SuperBill Details Patient Name: [name] Date of Service: 03/29/2015 Medical Record Number: [id number] Patient Account Number: [id number] Date of Birth/Sex: [birth year] (81 y.o. Female) Treating RN: [name] Primary Care Physician: [name] Other Clinician: Referring Physician: Constance [name] Treating Physician/Extender: [name] in Treatment: 28 Diagnosis Coding ICD-10 Codes Code Description [id number] Non-pressure chronic ulcer of left heel and midfoot with fat layer exposed 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 of heel and midfoot I70.235 Atherosclerosis of native arteries of right leg with ulceration of other part of foot Facility Procedures CPT4 Code: [id number] Description: 99213 - WOUND CARE VISIT-LEV 3 EST PT Modifier: Quantity: 1 Physician Procedures CPT4: Description Modifier Quantity Code [id number] 99213 - WC PHYS LEVEL 3 - EST PT 1 ICD-10 Description Diagnosis 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 of native arteries of right leg with ulceration of other part of foot Electronic Signature(s) Signed: 03/29/2015 10:44:03 AM By: [name] Fudge MD, FACS Entered By: [name] on 03/29/2015  10:44:03

## 2015-04-05 ENCOUNTER — Encounter: Payer: Medicare Other | Admitting: Surgery

## 2015-04-05 DIAGNOSIS — L97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: Secondary | ICD-10-CM | POA: Diagnosis not present

## 2015-04-05 NOTE — Progress Notes (Addendum)
KALANI, SCHWITTERS ([id number]) Visit Report for 04/05/2015 Arrival Information Details Patient Name: [name], [name] Date of Service: 04/05/2015 9:30 AM Medical Record Number: [id number] Patient Account Number: [id number] Date of Birth/Sex: [birth year] (80 y.o. Female) Treating RN: Afful, RN, BSN, Velva Harman Primary Care Physician: Constance Goltz Other Clinician: Referring Physician: Constance Goltz Treating Physician/Extender: Frann Rider in Treatment: 29 Visit Information History Since Last Visit Any new allergies or adverse reactions: No Patient Arrived: Wheel Chair Had a fall or experienced change in No Arrival Time: 09:35 activities of daily living that may affect Accompanied By: self risk of falls: Transfer Assistance: None Signs or symptoms of abuse/neglect since last No Patient Identification Verified: Yes visito Secondary Verification Process Yes Has Dressing in Place as Prescribed: Yes Completed: Pain Present Now: No Patient Requires Transmission- No Based Precautions: Patient Has Alerts: Yes Patient Alerts: Patient on Blood Thinner Electronic Signature(s) Signed: 04/05/2015 9:38:37 AM By: Regan Lemming BSN, RN Entered By: Regan Lemming on 04/05/2015 09:38:37 [name] ([id number]) -------------------------------------------------------------------------------- Clinic Level of Care Assessment Details Patient Name: [name] Date of Service: 04/05/2015 9:30 AM Medical Record Number: [id number] Patient Account Number: [id number] Date of Birth/Sex: June 15, 1928 (81 y.o. Female) Treating RN: Afful, RN, BSN, Velva Harman Primary Care Physician: Constance Goltz Other Clinician: Referring Physician: Constance Goltz Treating Physician/Extender: Frann Rider in Treatment: 29 Clinic Level of Care Assessment Items TOOL 4 Quantity Score []  - Use when only an EandM is performed on FOLLOW-UP visit 0 ASSESSMENTS - Nursing Assessment / Reassessment X - Reassessment of Co-morbidities  (includes updates in patient status) 1 10 X - Reassessment of Adherence to Treatment Plan 1 5 ASSESSMENTS - Wound and Skin Assessment / Reassessment X - Simple Wound Assessment / Reassessment - one wound 1 5 []  - Complex Wound Assessment / Reassessment - multiple wounds 0 []  - Dermatologic / Skin Assessment (not related to wound area) 0 ASSESSMENTS - Focused Assessment []  - Circumferential Edema Measurements - multi extremities 0 []  - Nutritional Assessment / Counseling / Intervention 0 []  - Lower Extremity Assessment (monofilament, tuning fork, pulses) 0 []  - Peripheral Arterial Disease Assessment (using hand held doppler) 0 ASSESSMENTS - Ostomy and/or Continence Assessment and Care []  - Incontinence Assessment and Management 0 []  - Ostomy Care Assessment and Management (repouching, etc.) 0 PROCESS - Coordination of Care X - Simple Patient / Family Education for ongoing care 1 15 []  - Complex (extensive) Patient / Family Education for ongoing care 0 []  - Staff obtains Programmer, systems, Records, Test Results / Process Orders 0 X - Staff telephones HHA, Nursing Homes / Clarify orders / etc 1 10 []  - Routine Transfer to another Facility (non-emergent condition) 0 [place], [place] ([id number]) []  - Routine Hospital Admission (non-emergent condition) 0 []  - New Admissions / Biomedical engineer / Ordering NPWT, Apligraf, etc. 0 []  - Emergency Hospital Admission (emergent condition) 0 []  - Simple Discharge Coordination 0 []  - Complex (extensive) Discharge Coordination 0 PROCESS - Special Needs []  - Pediatric / Minor Patient Management 0 []  - Isolation Patient Management 0 []  - Hearing / Language / Visual special needs 0 []  - Assessment of Community assistance (transportation, D/C planning, etc.) 0 []  - Additional assistance / Altered mentation 0 []  - Support Surface(s) Assessment (bed, cushion, seat, etc.) 0 INTERVENTIONS - Wound Cleansing / Measurement X - Simple Wound Cleansing - one wound 1  5 []  - Complex Wound Cleansing - multiple wounds 0 X - Wound Imaging (photographs - any number of wounds) 1 5 []  - Wound Tracing (instead  of photographs) 0 X - Simple Wound Measurement - one wound 1 5 []  - Complex Wound Measurement - multiple wounds 0 INTERVENTIONS - Wound Dressings X - Small Wound Dressing one or multiple wounds 1 10 []  - Medium Wound Dressing one or multiple wounds 0 []  - Large Wound Dressing one or multiple wounds 0 []  - Application of Medications - topical 0 []  - Application of Medications - injection 0 INTERVENTIONS - Miscellaneous []  - External ear exam 0 [place], Millicent ([id number]) []  - Specimen Collection (cultures, biopsies, blood, body fluids, etc.) 0 []  - Specimen(s) / Culture(s) sent or taken to Lab for analysis 0 []  - Patient Transfer (multiple staff / Harrel Lemon Lift / Similar devices) 0 []  - Simple Staple / Suture removal (25 or less) 0 []  - Complex Staple / Suture removal (26 or more) 0 []  - Hypo / Hyperglycemic Management (close monitor of Blood Glucose) 0 []  - Ankle / Brachial Index (ABI) - do not check if billed separately 0 X - Vital Signs 1 5 Has the patient been seen at the hospital within the last three years: Yes Total Score: 75 Level Of Care: New/Established - Level 2 Electronic Signature(s) Signed: 04/06/2015 12:59:12 PM By: Regan Lemming BSN, RN Entered By: Regan Lemming on 04/06/2015 12:59:12 [name] ([id number]) -------------------------------------------------------------------------------- Encounter Discharge Information Details Patient Name: [name] Date of Service: 04/05/2015 9:30 AM Medical Record Number: [id number] Patient Account Number: [id number] Date of Birth/Sex: 12-Oct-1927 (81 y.o. Female) Treating RN: Baruch Gouty, RN, BSN, Velva Harman Primary Care Physician: Constance Goltz Other Clinician: Referring Physician: Constance Goltz Treating Physician/Extender: Frann Rider in Treatment: 60 Encounter Discharge Information  Items Discharge Pain Level: 0 Discharge Condition: Stable Ambulatory Status: Wheelchair Nursing Discharge Destination: Home Transportation: Private Auto Accompanied By: self Schedule Follow-up Appointment: No Medication Reconciliation completed No and provided to Patient/Care Lagretta Loseke: Clinical Summary of Care: Electronic Signature(s) Signed: 04/06/2015 1:01:37 PM By: Regan Lemming BSN, RN Entered By: Regan Lemming on 04/06/2015 13:01:37 [name] ([id number]) -------------------------------------------------------------------------------- Lower Extremity Assessment Details Patient Name: [name] Date of Service: 04/05/2015 9:30 AM Medical Record Number: [id number] Patient Account Number: [id number] Date of Birth/Sex: [birth year] (81 y.o. Female) Treating RN: Afful, RN, BSN, Velva Harman Primary Care Physician: Constance Goltz Other Clinician: Referring Physician: Constance Goltz Treating Physician/Extender: Frann Rider in Treatment: 29 Vascular Assessment Pulses: Posterior Tibial Dorsalis Pedis Palpable: [Left:No] Doppler: [Left:Monophasic] Extremity colors, hair growth, and conditions: Extremity Color: [Left:Normal] Hair Growth on Extremity: [Left:No] Temperature of Extremity: [Left:Warm] Capillary Refill: [Left:< 3 seconds] Toe Nail Assessment Left: Right: Thick: Yes Discolored: No Deformed: No Improper Length and Hygiene: No Electronic Signature(s) Signed: 04/05/2015 9:41:21 AM By: Regan Lemming BSN, RN Entered By: Regan Lemming on 04/05/2015 09:41:20 [name] ([id number]) -------------------------------------------------------------------------------- Multi Wound Chart Details Patient Name: [name] Date of Service: 04/05/2015 9:30 AM Medical Record Number: [id number] Patient Account Number: [id number] Date of Birth/Sex: 09/03/1927 (81 y.o. 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 N/A] Comorbid History: [6:Glaucoma, Asthma, Hypertension, Peripheral Venous Disease, Type II Diabetes] [N/A:N/A] Date Acquired: [6:09/26/2014] [N/A:N/A] Weeks of Treatment: [6:26] [N/A:N/A] Wound Status: [6:Open] [N/A:N/A] Measurements L x W x D 1x1x0.1 [N/A:N/A] (cm) Area (cm) : [  6:0.785] [N/A:N/A] Volume (cm) : [6:0.079] [N/A:N/A] % Reduction in Area: [6:79.20%] [N/A:N/A] % Reduction in Volume: 95.80% [N/A:N/A] Classification: [6:Partial Thickness] [N/A:N/A] HBO Classification: [6:Grade 1] [N/A:N/A] Exudate Amount: [6:Small] [N/A:N/A] Exudate Type: [6:Serous] [N/A:N/A] Exudate Color: [6:amber] [N/A:N/A] Wound Margin: [6:Distinct, outline attached N/A] Granulation Amount: [6:Medium (34-66%)] [N/A:N/A] Granulation Quality: [6:Pink] [N/A:N/A] Necrotic Amount: [6:Small (1-33%)] [N/A:N/A] Exposed Structures: [6:Fascia: No Fat: No Tendon: No Muscle: No] [N/A:N/A] Joint: No Bone: No Limited to Skin Breakdown Epithelialization: Medium (34-66%) N/A N/A Periwound Skin Texture: Edema: Yes N/A N/A Excoriation: No Induration: No Callus: No Crepitus: No Fluctuance: No Friable: No Rash: No Scarring: No Periwound Skin Maceration: Yes N/A N/A Moisture: Moist: Yes Dry/Scaly: No Periwound Skin Color: Hemosiderin Staining: Yes N/A N/A Atrophie Blanche: No Cyanosis: No Ecchymosis: No Erythema: No Mottled: No Pallor: No Rubor: No Temperature: No Abnormality N/A N/A Tenderness on No N/A N/A Palpation: Wound Preparation: Ulcer Cleansing:  N/A N/A Rinsed/Irrigated with Saline Topical Anesthetic Applied: Other: lidocaine 4% Treatment Notes Electronic Signature(s) Signed: 04/05/2015 9:47:14 AM By: Regan Lemming BSN, RN Entered By: Regan Lemming on 04/05/2015 09:47:14 [name] ([id number]) -------------------------------------------------------------------------------- [place] Details Patient Name: [name] Date of Service: 04/05/2015 9:30 AM Medical Record Number: [id number] Patient Account Number: [id number] Date of Birth/Sex: 12/15/1927 (81 y.o. Female) Treating RN: Afful, RN, BSN, Velva Harman Primary Care Physician: Constance Goltz Other Clinician: Referring Physician: Constance Goltz Treating Physician/Extender: Frann Rider in Treatment: 5 Active Inactive Abuse / Safety / Falls / Self Care Management Nursing Diagnoses: Impaired physical mobility Potential for falls Goals: Patient will remain injury free Date Initiated: 09/12/2014 Goal Status: Active Patient/caregiver will verbalize understanding of skin care regimen Date Initiated: 09/12/2014 Goal Status: Active Patient/caregiver will verbalize/demonstrate measures taken to prevent injury and/or falls Date Initiated: 09/12/2014 Goal Status: Active Patient/caregiver will verbalize/demonstrate understanding of what to do in case of emergency Date Initiated: 09/12/2014 Goal Status: Active Interventions: Assess fall risk on admission and as needed Assess: immobility, friction, shearing, incontinence upon admission and as needed Assess impairment of mobility on admission and as needed per policy Provide education on basic hygiene Provide education on fall prevention Provide education on personal and home safety Provide education on safe transfers Treatment Activities: Education provided on Basic Hygiene : 10/12/2014 Notes: Necrotic Tissue [name], [name] ([id number]) Nursing Diagnoses: Impaired tissue integrity related to  necrotic/devitalized tissue Knowledge deficit related to management of necrotic/devitalized tissue Goals: Necrotic/devitalized tissue will be minimized in the wound bed Date Initiated: 09/12/2014 Goal Status: Active Patient/caregiver will verbalize understanding of reason and process for debridement of necrotic tissue Date Initiated: 09/12/2014 Goal Status: Active Interventions: Assess patient pain level pre-, during and post procedure and prior to discharge Provide education on necrotic tissue and debridement process Treatment Activities: Apply topical anesthetic as ordered : 04/05/2015 Enzymatic debridement : 04/05/2015 Excisional debridement : 04/05/2015 Notes: Orientation to the Wound Care Program Nursing Diagnoses: Knowledge deficit related to the wound healing center program Goals: Patient/caregiver will verbalize understanding of the [place] Program Date Initiated: 09/12/2014 Goal Status: Active Interventions: Provide education on orientation to the wound center Notes: Pressure Nursing Diagnoses: Knowledge deficit related to causes and risk factors for pressure ulcer development Knowledge deficit related to management of pressures ulcers Potential for impaired tissue integrity related to pressure, friction, moisture, and shear GoalsDAJANAI, [name] ([id number]) Patient will remain free from development of additional pressure ulcers Date Initiated: 09/12/2014 Goal Status: Active Patient will remain free of pressure ulcers Date Initiated: 09/12/2014 Goal Status: Active Patient/caregiver will verbalize  risk factors for pressure ulcer development Date Initiated: 09/12/2014 Goal Status: Active Patient/caregiver will verbalize understanding of pressure ulcer management Date Initiated: 09/12/2014 Goal Status: Active Interventions: Assess: immobility, friction, shearing, incontinence upon admission and as needed Assess offloading mechanisms upon admission and as  needed Assess potential for pressure ulcer upon admission and as needed Provide education on pressure ulcers Treatment Activities: Pressure reduction/relief device ordered : 04/05/2015 Notes: Wound/Skin Impairment Nursing Diagnoses: Impaired tissue integrity Knowledge deficit related to ulceration/compromised skin integrity Goals: Patient/caregiver will verbalize understanding of skin care regimen Date Initiated: 09/12/2014 Goal Status: Active Ulcer/skin breakdown will heal within 14 weeks Date Initiated: 09/12/2014 Goal Status: Active Interventions: Assess patient/caregiver ability to obtain necessary supplies Assess patient/caregiver ability to perform ulcer/skin care regimen upon admission and as needed Assess ulceration(s) every visit Provide education on ulcer and skin care Treatment Activities: Skin care regimen initiated : 04/05/2015 [name], [name] ([id number]) Topical wound management initiated : 04/05/2015 Notes: Electronic Signature(s) Signed: 04/05/2015 9:46:02 AM By: Regan Lemming BSN, RN Entered By: Regan Lemming on 04/05/2015 [name] ([id number]) -------------------------------------------------------------------------------- Pain Assessment Details Patient Name: [name] Date of Service: 04/05/2015 9:30 AM Medical Record Number: [id number] Patient Account Number: [id number] Date of Birth/Sex: 12/12/1927 (81 y.o. Female) Treating RN: Baruch Gouty, RN, BSN, Velva Harman Primary Care Physician: Constance Goltz Other Clinician: Referring Physician: Constance Goltz Treating Physician/Extender: Frann Rider in Treatment: 29 Active Problems Location of Pain Severity and Description of Pain Patient Has Paino No Site Locations Pain Management and Medication Current Pain Management: Electronic Signature(s) Signed: 04/05/2015 9:38:45 AM By: Regan Lemming BSN, RN Entered By: Regan Lemming on 04/05/2015 09:38:45 [name]  ([id number]) -------------------------------------------------------------------------------- Patient/Caregiver Education Details Patient Name: [name] Date of Service: 04/05/2015 9:30 AM Medical Record Number: [id number] Patient Account Number: [id number] Date of Birth/Gender: 20-Nov-1927 (81 y.o. Female) Treating RN: Baruch Gouty, RN, BSN, Velva Harman Primary Care Physician: Constance Goltz Other Clinician: Referring Physician: Constance Goltz Treating Physician/Extender: Frann Rider in Treatment: 65 Education Assessment Education Provided To: Patient Education Topics Provided Basic Hygiene: Pressure: Methods: Explain/Verbal Responses: State content correctly Safety: Methods: Explain/Verbal Responses: State content correctly Welcome To [facility] [place]: Methods: Explain/Verbal Responses: State content correctly Wound Debridement: Methods: Explain/Verbal Responses: State content correctly Electronic Signature(s) Signed: 04/06/2015 1:02:21 PM By: Regan Lemming BSN, RN Entered By: Regan Lemming on 04/06/2015 13:02:21 [name] ([id number]) -------------------------------------------------------------------------------- Wound Assessment Details Patient Name: [name] Date of Service: 04/05/2015 9:30 AM Medical Record Number: [id number] Patient Account Number: [id number] Date of Birth/Sex: [birth year] (81 y.o. Female) Treating RN: Afful, RN, BSN, Velva Harman Primary Care Physician: Constance Goltz Other Clinician: Referring Physician: Constance Goltz Treating Physician/Extender: Frann Rider in Treatment: 29 Wound Status Wound Number: 6 Primary Arterial Insufficiency Ulcer Etiology: Wound Location: Left Achilles Wound Open Wounding Event: Gradually Appeared Status: Date Acquired: 09/26/2014 Comorbid Glaucoma, Asthma, Hypertension, Weeks Of Treatment: 26 History: Peripheral Venous Disease, Type II Clustered Wound: No Diabetes Photos Photo Uploaded By: Regan Lemming on  04/05/2015 16:18:26 Wound Measurements Length: (cm) 1 Width: (cm) 1 Depth: (cm) 0.1 Area: (cm) 0.785 Volume: (cm) 0.079 % Reduction in Area: 79.2% % Reduction in Volume: 95.8% Epithelialization: Medium (34-66%) Tunneling: No Undermining: No Wound Description Classification: Partial Thickness Foul O Diabetic Severity (Wagner): Grade 1 Wound Margin: Distinct, outline attached Exudate Amount: Small Exudate Type: Serous Exudate Color: amber dor After Cleansing: No Wound Bed Granulation Amount: Medium (34-66%) Exposed Structure Granulation Quality: Pink Fascia Exposed: No Necrotic Amount: Small (1-33%) Fat Layer Exposed: No [place], [place] ([id number]) Necrotic Quality: Adherent  Slough Tendon Exposed: No Muscle Exposed: No Joint Exposed: No Bone Exposed: No Limited to Skin Breakdown Periwound Skin Texture Texture Color No Abnormalities Noted: No No Abnormalities Noted: No Callus: No Atrophie Blanche: No Crepitus: No Cyanosis: No Excoriation: No Ecchymosis: No Fluctuance: No Erythema: No Friable: No Hemosiderin Staining: Yes Induration: No Mottled: No Localized Edema: Yes Pallor: No Rash: No Rubor: No Scarring: No Temperature / Pain Moisture Temperature: No Abnormality No Abnormalities Noted: No Dry / Scaly: No Maceration: Yes Moist: Yes Wound Preparation Ulcer Cleansing: Rinsed/Irrigated with Saline Topical Anesthetic Applied: Other: lidocaine 4%, Treatment Notes Wound #6 (Left Achilles) 1. Cleansed with: Clean wound with Normal Saline 3. Peri-wound Care: Skin Prep 4. Dressing Applied: Prisma Ag 5. Secondary Dressing Applied Bordered Foam Dressing Electronic Signature(s) Signed: 04/05/2015 9:40:02 AM By: Regan Lemming BSN, RN Entered By: Regan Lemming on 04/05/2015 [place], [place] ([id number]) -------------------------------------------------------------------------------- [place] Details Patient Name: [name] Date of Service:  04/05/2015 9:30 AM Medical Record Number: [id number] Patient Account Number: [id number] Date of Birth/Sex: 1928-05-07 (81 y.o. Female) Treating RN: Afful, RN, BSN, Velva Harman Primary Care Physician: Constance Goltz Other Clinician: Referring Physician: Constance Goltz Treating Physician/Extender: Frann Rider in Treatment: 29 Vital Signs Time Taken: 09:38 Temperature (F): 98.5 Height (in): 64 Pulse (bpm): 68 Weight (lbs): 180 Respiratory Rate (breaths/min): 16 Body Mass Index (BMI): 30.9 Blood Pressure (mmHg): 160/59 Reference Range: 80 - 120 mg / dl Electronic Signature(s) Signed: 04/05/2015 9:39:29 AM By: Regan Lemming BSN, RN Entered By: Regan Lemming on 04/05/2015 09:39:29

## 2015-04-05 NOTE — Progress Notes (Addendum)
[name], [name] ([id number]) Visit Report for 04/05/2015 Chief Complaint Document Details Patient Name: [name], [name] Date of Service: 04/05/2015 9:30 AM Medical Record Number: [id number] Patient Account Number: [id number] Date of Birth/Sex: 06-21-1928 (81 y.o. Female) Treating RN: Afful, RN, BSN, Velva Harman Primary Care Physician: Constance Goltz Other Clinician: Referring Physician: Constance Goltz Treating Physician/Extender: Frann Rider in Treatment: 32 Information Obtained from: Patient Chief Complaint Patient presents to the wound care center today with an open arterial ulcer on the left foot big toe and lateral heal. She also has some superficial ulcerations on the second and third toe dorsum. she has no family member with her today and she is a very poor historian and cannot give a proper history. As stated that she walks around with a walker. Electronic Signature(s) Signed: 04/05/2015 9:56:09 AM By: Christin Fudge MD, FACS Entered By: Christin Fudge on 04/05/2015 09:56:09 [name] ([id number]) -------------------------------------------------------------------------------- HPI Details Patient Name: [name] Date of Service: 04/05/2015 9:30 AM Medical Record Number: [id number] Patient Account Number: [id number] Date of Birth/Sex: 06-19-1928 (81 y.o. Female) Treating RN: Baruch Gouty, RN, BSN, Velva Harman Primary Care Physician: Constance Goltz Other Clinician: Referring Physician: Constance Goltz Treating Physician/Extender: Frann Rider in Treatment: 16 History of Present Illness Location: left heel Quality: Patient reports experiencing a dull pain to affected area(s). Severity: Patient states wound are getting worse. Duration: Patient states that they are not certain how long the wound has been present. Timing: Pain in wound is Intermittent (comes and goes Context: The wound appeared gradually over time Associated Signs and Symptoms: Patient reports having difficulty  standing for long periods. HPI Description: This 81 year old patient is a poor historian and comes from a nursing home. Does not have any family members with her. Says she has a ulcer on the lateral part of her left foot and some new ones are there on her right foot too. She is unable to say how long she's had these. she does walk with a walker and this is limited most of the day. Does not recall if she has had any vascular studies done. I have reviewed an x-ray done of the left foot which basically shows osteoporosis but no evidence of fracture dislocation or osteomyelitis. 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 She seems to be doing better overall. 10/19/14 -- I understand she has gone to the vascular surgery and lab yesterday and has had all her tests done yesterday. She is doing fine otherwise and has had no fresh issues. 10/26/2014 she seems to be doing well and has no fresh problems and seems much brighter. 10/12/14 -- Taking her antibiotic and continues to do her dressings locally and she is helped by her skilled nursing. She seems to be doing fine and has no fresh issues. 10/19/14 -- she is doing fine and has had no fresh issues. She says her son to go for her vascular workup yesterday and she has been called back after 3 months. we will try and gather these reports to review what they said. 10/26/14 --I have been able to review notes from the vascular surgery office and these were dated from 10/18/2014. He patient was on her first postop visit and had Doppler ultrasounds done of her arteries. She had more than 50% stenosis of the right superficial femoral artery and more than 50% stenosis in the  left tibioperoneal trunk. She was status post a left lower extremity angiogram with angioplasty of the peroneal and left  superficial femoral arteries for a nonhealing left foot and ankle ulceration. note is made of the fact that the ABIs had not changed in the last month since her previous visit. The opinion of her provider was that she did not need any intervention at this time and they had done everything they could at the present time. They would continue on Plavix and antibiotics and see her back in 3 months for a another arterial duplex study of the lower extremities. LILLYBETH, SWARTZWELDER ([id number]) 11/02/2014 -- she has spoken to her caregivers about coming for HBO 5 times per week for 6-8 weeks and they are agreeable about this and she is willing to undergo hyperbaric oxygen therapy. 11/16/2014 she has been worked up with a EKG and chest x-ray and these were within normal limits. As far as the investigations go she is ready for HBOT. we are awaiting some insurance clearance so that she can start on her hyperbaric oxygen therapy. 11/30/2014 she tolerated hyperbaric oxygen therapy fine and there were no issues with her blood glucose levels. As noted she is not a diabetic and most of her fingerstick blood glucose levels are inaccurate because of the Raynaud's phenomena. She normally has to have your lobe sticks to get any random glucose levels. I have also requested her primary care does a venous blood glucose draw to check her glucose level. 12/21/2014 -- she is tolerating hyperbaric oxygen very well and has overall made a lot of progress. Her left leg is looking much better. 01/11/2015 - No new complaints today. New ulceration on the dorsum of the right second toe noted today on physical exam. Patient unaware. No significant pain. No fever or chills. No significant drainage. Offloading with sage boots at night. Significantly improved with HBO. 01/19/2015 - the patient has had a x-ray of the right foot done at the nursing home but we still do not have the report and we are awaiting this result. She is otherwise  doing fine with her general health. 01/25/2015 -- X-ray of her right foot showed no acute fracture, dislocation, bony infection or osteomyelitis. The was some soft tissue swelling and osteopenia seen. 02/15/2015 -- due to some insurance issues we could not get her epifix and we are working with her company to get her this. She is doing fine otherwise and has no fresh issues. 04/04/2015 -- she had a follow-up with the vascular surgeon and he seemed to be pleased with her progress and has had no problems with her vascularity. Electronic Signature(s) Signed: 04/05/2015 9:56:50 AM By: Christin Fudge MD, FACS Entered By: Christin Fudge on 04/05/2015 09:56:50 [name] ([id number]) -------------------------------------------------------------------------------- Physical Exam Details Patient Name: [name] Date of Service: 04/05/2015 9:30 AM Medical Record Number: [id number] Patient Account Number: [id number] Date of Birth/Sex: 1928/03/21 (81 y.o. Female) Treating RN: Afful, RN, BSN, Velva Harman Primary Care Physician: Constance Goltz Other Clinician: Referring Physician: Constance Goltz Treating Physician/Extender: Frann Rider in Treatment: 29 Constitutional . Pulse regular. Respirations normal and unlabored. Afebrile. . Eyes Nonicteric. Reactive to light. Ears, Nose, Mouth, and Throat Lips, teeth, and gums WNL.Marland Kitchen Moist mucosa without lesions . Neck supple and nontender. No palpable supraclavicular or cervical adenopathy. Normal sized without goiter. Respiratory WNL. No retractions.. Cardiovascular Pedal Pulses WNL. No clubbing, cyanosis or edema. Chest Breasts symmetical and no nipple discharge.. Breast tissue WNL, no masses, lumps, or tenderness.. Lymphatic No adneopathy. No  adenopathy. No adenopathy. Musculoskeletal Adexa without tenderness or enlargement.. Digits and nails w/o clubbing, cyanosis, infection, petechiae, ischemia, or inflammatory conditions.. Integumentary  (Hair, Skin) No suspicious lesions. No crepitus or fluctuance. No peri-wound warmth or erythema. No masses.Marland Kitchen Psychiatric Judgement and insight Intact.. No evidence of depression, anxiety, or agitation.. Notes The wound is clean and about the same size and she has had no further reduction in size. Electronic Signature(s) Signed: 04/05/2015 9:57:21 AM By: Christin Fudge MD, FACS Entered By: Christin Fudge on 04/05/2015 09:57:20 [name] ([id number]) -------------------------------------------------------------------------------- Physician Orders Details Patient Name: [name] Date of Service: 04/05/2015 9:30 AM Medical Record Number: [id number] Patient Account Number: [id number] Date of Birth/Sex: 06-26-1928 (81 y.o. Female) Treating RN: Afful, RN, BSN, Velva Harman Primary Care Physician: Constance Goltz Other Clinician: Referring Physician: Constance Goltz Treating Physician/Extender: Frann Rider in Treatment: 77 Verbal / Phone Orders: Yes Clinician: Afful, RN, BSN, Rita Read Back and Verified: Yes Diagnosis Coding Wound Cleansing Wound #6 Left Achilles o Clean wound with Normal Saline. - be sure to clean out all remaining prisma Anesthetic Wound #6 Left Achilles o Topical Lidocaine 4% cream applied to wound bed prior to debridement Primary Wound Dressing Wound #6 Left Achilles o Prisma Ag - or collagen with silver equivalent - triple layer please Secondary Dressing Wound #6 Left Achilles o Boardered Foam Dressing Dressing Change Frequency Wound #6 Left Achilles o Change dressing every other day. Follow-up Appointments Wound #6 Left Achilles o Return Appointment in 1 week. Off-Loading Wound #6 Left Achilles o Other: - SAGE boots while sitting or lying. Do not wear while ambulating. Home Health Wound #6 Left Achilles o Continue Home Health Visits - [place] Nurse may visit PRN to address patientos wound care needs. o FACE TO FACE  ENCOUNTER: MEDICARE and MEDICAID PATIENTS: I certify that this patient is under my care and that I had a face-to-face encounter that meets the physician face-to-face encounter requirements with this patient on this date. The encounter with the patient was in [place], [place] ([id number]) whole or in part for the following MEDICAL CONDITION: (primary reason for [place]) MEDICAL NECESSITY: I certify, that based on my findings, NURSING services are a medically necessary home health service. HOME BOUND STATUS: I certify that my clinical findings support that this patient is homebound (i.e., Due to illness or injury, pt requires aid of supportive devices such as crutches, cane, wheelchairs, walkers, the use of special transportation or the assistance of another person to leave their place of residence. There is a normal inability to leave the home and doing so requires considerable and taxing effort. Other absences are for medical reasons / religious services and are infrequent or of short duration when for other reasons). o If current dressing causes regression in wound condition, may D/C ordered dressing product/s and apply Normal Saline Moist Dressing daily until next [place] / Other MD appointment. [place] of regression in wound condition at [phone]. o Please direct any NON-WOUND related issues/requests for orders to patient's Primary Care Physician Electronic Signature(s) Signed: 04/05/2015 9:56:52 AM By: Regan Lemming BSN, RN Signed: 04/05/2015 1:56:54 PM By: Christin Fudge MD, FACS Entered By: Regan Lemming on 04/05/2015 09:56:52 [name] ([id number]) -------------------------------------------------------------------------------- Problem List Details Patient Name: [name] Date of Service: 04/05/2015 9:30 AM Medical Record Number: [id number] Patient Account Number: [id number] Date of Birth/Sex: 12/21/1927 (81 y.o. 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 09/12/2014 Yes of heel and midfoot I70.235 Atherosclerosis of native arteries of right leg with 09/12/2014 Yes ulceration of other part of foot Inactive Problems Resolved Problems ICD-10 Code Description Active Date Resolved Date L97.412 Non-pressure chronic ulcer of right heel and midfoot with 10/26/2014 10/26/2014 fat layer exposed S91.104A Unspecified open wound of right lesser toe(s) without 01/11/2015 01/11/2015 damage to nail, initial encounter Electronic Signature(s) Signed: 04/05/2015 9:56:02 AM By: Christin Fudge MD, FACS Entered By: Christin Fudge on 04/05/2015 [place], [place] ([id number]) -------------------------------------------------------------------------------- Progress Note Details Patient Name: [name] Date of Service: 04/05/2015 9:30 AM Medical Record Number: [id number] Patient Account Number: [id number] Date of Birth/Sex: Dec 18, 1927 (81 y.o. Female) Treating RN: Afful, RN, BSN, Velva Harman Primary Care Physician: Constance Goltz Other Clinician: Referring Physician: Constance Goltz Treating Physician/Extender: Frann Rider in Treatment: 74 Subjective Chief Complaint Information obtained from Patient Patient presents to the wound care center today with an open arterial ulcer on the left foot big toe and lateral heal. She also has some superficial ulcerations on the second and third toe dorsum. she has no family member with her today and she is a very poor historian and cannot give a proper history. As stated that she walks around with a  walker. History of Present Illness (HPI) The following HPI elements were documented for the patient's wound: Location: left heel Quality: Patient reports experiencing a dull pain to affected area(s). Severity: Patient states wound are getting worse. Duration: Patient states that they are not certain how long the wound has been present. Timing: Pain in wound is Intermittent (comes and goes Context: The wound appeared gradually over time Associated Signs and Symptoms: Patient reports having difficulty standing for long periods. This 81 year old patient is a poor historian and comes from a nursing home. Does not have any family members with her. Says she has a ulcer on the lateral part of her left foot and some new ones are there on her right foot too. She is unable to say how long she's had these. she does walk with a walker and this is limited most of the day. Does not recall if she has had any vascular studies done. I have reviewed an x-ray done of the left foot which basically shows osteoporosis but no evidence of fracture dislocation or osteomyelitis. 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 She seems to be doing better overall. 10/19/14 -- I understand she has gone to the vascular surgery and lab yesterday and has had all her tests done yesterday. She is doing fine otherwise and has had no fresh issues. 10/26/2014 she seems to be doing well and has no fresh problems and seems much brighter. 10/12/14 -- Taking her antibiotic and continues to do her dressings locally and she is helped by her skilled nursing. She seems to be doing fine and has no fresh issues. 10/19/14 -- she is doing fine and has had no fresh issues. She says her son to go for her vascular workup yesterday and she has been called back after 3 months. we will  try and gather these reports to  review what LILLIE, KIERAN ([id number]) they said. 10/26/14 --I have been able to review notes from the vascular surgery office and these were dated from 10/18/2014. He patient was on her first postop visit and had Doppler ultrasounds done of her arteries. She had more than 50% stenosis of the right superficial femoral artery and more than 50% stenosis in the left tibioperoneal trunk. She was status post a left lower extremity angiogram with angioplasty of the peroneal and left superficial femoral arteries for a nonhealing left foot and ankle ulceration. note is made of the fact that the ABIs had not changed in the last month since her previous visit. The opinion of her provider was that she did not need any intervention at this time and they had done everything they could at the present time. They would continue on Plavix and antibiotics and see her back in 3 months for a another arterial duplex study of the lower extremities. 11/02/2014 -- she has spoken to her caregivers about coming for HBO 5 times per week for 6-8 weeks and they are agreeable about this and she is willing to undergo hyperbaric oxygen therapy. 11/16/2014 she has been worked up with a EKG and chest x-ray and these were within normal limits. As far as the investigations go she is ready for HBOT. we are awaiting some insurance clearance so that she can start on her hyperbaric oxygen therapy. 11/30/2014 she tolerated hyperbaric oxygen therapy fine and there were no issues with her blood glucose levels. As noted she is not a diabetic and most of her fingerstick blood glucose levels are inaccurate because of the Raynaud's phenomena. She normally has to have your lobe sticks to get any random glucose levels. I have also requested her primary care does a venous blood glucose draw to check her glucose level. 12/21/2014 -- she is tolerating hyperbaric oxygen very well and has overall made a lot of  progress. Her left leg is looking much better. 01/11/2015 - No new complaints today. New ulceration on the dorsum of the right second toe noted today on physical exam. Patient unaware. No significant pain. No fever or chills. No significant drainage. Offloading with sage boots at night. Significantly improved with HBO. 01/19/2015 - the patient has had a x-ray of the right foot done at the nursing home but we still do not have the report and we are awaiting this result. She is otherwise doing fine with her general health. 01/25/2015 -- X-ray of her right foot showed no acute fracture, dislocation, bony infection or osteomyelitis. The was some soft tissue swelling and osteopenia seen. 02/15/2015 -- due to some insurance issues we could not get her epifix and we are working with her company to get her this. She is doing fine otherwise and has no fresh issues. 04/04/2015 -- she had a follow-up with the vascular surgeon and he seemed to be pleased with her progress and has had no problems with her vascularity. [place], [place] ([id number]) Constitutional Pulse regular. Respirations normal and unlabored. Afebrile. Vitals Time Taken: 9:38 AM, Height: 64 in, Weight: 180 lbs, BMI: 30.9, Temperature: 98.5 F, Pulse: 68 bpm, Respiratory Rate: 16 breaths/min, Blood Pressure: 160/59 mmHg. Eyes Nonicteric. Reactive to light. Ears, Nose, Mouth, and Throat Lips, teeth, and gums WNL.Marland Kitchen Moist mucosa without lesions . Neck supple and nontender. No palpable supraclavicular or cervical adenopathy. Normal sized without goiter. Respiratory WNL. No retractions.. Cardiovascular Pedal Pulses WNL. No clubbing, cyanosis or edema. Chest Breasts symmetical and no nipple discharge.. Breast  tissue WNL, no masses, lumps, or tenderness.. Lymphatic No adneopathy. No adenopathy. No adenopathy. Musculoskeletal Adexa without tenderness or enlargement.. Digits and nails w/o clubbing, cyanosis, infection,  petechiae, ischemia, or inflammatory conditions.Marland Kitchen Psychiatric Judgement and insight Intact.. No evidence of depression, anxiety, or agitation.. General Notes: The wound is clean and about the same size and she has had no further reduction in size. Integumentary (Hair, Skin) No suspicious lesions. No crepitus or fluctuance. No peri-wound warmth or erythema. No masses.. Wound #6 status is Open. Original cause of wound was Gradually Appeared. The wound is located on the Left Achilles. The wound measures 1cm length x 1cm width x 0.1cm depth; 0.785cm^2 area and 0.079cm^3 volume. The wound is limited to skin breakdown. There is no tunneling or undermining noted. There is a small amount of serous drainage noted. The wound margin is distinct with the outline attached to the wound base. There is medium (34-66%) pink granulation within the wound bed. There is a small (1-33%) amount of necrotic tissue within the wound bed including Adherent Slough. The periwound skin appearance exhibited: Localized Edema, Maceration, Moist, Hemosiderin Staining. The periwound skin appearance did not exhibit: Callus, Crepitus, Excoriation, Fluctuance, Friable, Induration, Rash, Scarring, Dry/Scaly, Atrophie Blanche, Cyanosis, Ecchymosis, Mottled, Pallor, Rubor, Erythema. Periwound temperature was [place], [place] ([id number]) noted as No Abnormality. 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 She will continue to offload and see me back next week. Plan Wound Cleansing: Wound #6 Left Achilles: Clean wound with Normal Saline. - be sure to clean out all remaining  prisma Anesthetic: Wound #6 Left Achilles: Topical Lidocaine 4% cream applied to wound bed prior to debridement Primary Wound Dressing: Wound #6 Left Achilles: Prisma Ag - or collagen with silver equivalent - triple layer please Secondary Dressing: Wound #6 Left Achilles: Boardered Foam Dressing Dressing Change Frequency: Wound #6 Left Achilles: Change dressing every other day. Follow-up Appointments: Wound #6 Left Achilles: Return Appointment in 1 week. Off-Loading: Wound #6 Left Achilles: Other: - SAGE boots while sitting or lying. Do not wear while ambulating. Home Health: Wound #6 Left Achilles: Continue Home Health Visits - [facility] Nurse may visit PRN to address patient s wound care needs. [place], [place] ([id number]) FACE TO FACE ENCOUNTER: MEDICARE and MEDICAID PATIENTS: I certify that this patient is under my care and that I had a face-to-face encounter that meets the physician face-to-face encounter requirements with this patient on this date. The encounter with the patient was in whole or in part for the following MEDICAL CONDITION: (primary reason for [place]) MEDICAL NECESSITY: I certify, that based on my findings, NURSING services are a medically necessary home health service. HOME BOUND STATUS: I certify that my clinical findings support that this patient is homebound (i.e., Due to illness or injury, pt requires aid of supportive devices such as crutches, cane, wheelchairs, walkers, the use of special transportation or the assistance of another person to leave their place of residence. There is a normal inability to leave the home and doing so requires considerable and taxing effort. Other absences are for medical reasons / religious services and are infrequent or of short duration when for  other reasons). If current dressing causes regression in wound condition, may D/C ordered dressing product/s and apply Normal Saline Moist Dressing daily until  next [place] / Other MD appointment. [place] of regression in wound condition at [phone]. Please direct any NON-WOUND related issues/requests for orders to patient's Primary Care Physician Last week we had tried a sample of Endoform and this week we will go back to [employer]. She will continue to offload and see me back next week. Electronic Signature(s) Signed: 04/05/2015 9:58:09 AM By: Christin Fudge MD, FACS Entered By: Christin Fudge on 04/05/2015 09:58:08 [name] ([id number]) -------------------------------------------------------------------------------- SuperBill Details Patient Name: [name] Date of Service: 04/05/2015 Medical Record Number: [id number] Patient Account Number: [id number] Date of Birth/Sex: [birth year] (81 y.o. 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 I70.235 Atherosclerosis of native arteries of right leg with ulceration of other part of foot Physician Procedures CPT4: Description Modifier Quantity Code [id number] - WC PHYS LEVEL 3 - EST PT 1 ICD-10 Description Diagnosis 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 of native arteries of right leg with ulceration of other part of foot Electronic Signature(s) Signed: 04/05/2015 9:58:29 AM By: Christin Fudge MD, FACS Entered By: Christin Fudge on 04/05/2015 09:58:29

## 2015-04-12 ENCOUNTER — Encounter: Payer: Medicare Other | Admitting: Surgery

## 2015-04-12 DIAGNOSIS — L97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: Secondary | ICD-10-CM | POA: Diagnosis not present

## 2015-04-12 NOTE — Progress Notes (Signed)
[name], [name] ([id number]) Visit Report for 04/12/2015 Arrival Information Details Patient Name: [name], [name] Date of Service: 04/12/2015 9:30 AM Medical Record Number: [id number] Patient Account Number: [id number] Date of Birth/Sex: 03-23-1928 (81 y.o. Female) Treating RN: Afful, RN, BSN, Velva Harman Primary Care Physician: Constance Goltz Other Clinician: Referring Physician: Constance Goltz Treating Physician/Extender: Frann Rider in Treatment: 30 Visit Information History Since Last Visit Added or deleted any medications: No Patient Arrived: Wheel Chair Any new allergies or adverse reactions: No Arrival Time: 09:23 Had a fall or experienced change in No Accompanied By: driver activities of daily living that may affect Transfer Assistance: None risk of falls: Patient Identification Verified: Yes Signs or symptoms of abuse/neglect since last No Secondary Verification Process Yes visito Completed: Hospitalized since last visit: No Patient Requires Transmission- No Has Dressing in Place as Prescribed: Yes Based Precautions: Pain Present Now: No Patient Has Alerts: Yes Patient Alerts: Patient on Blood Thinner Electronic Signature(s) Signed: 04/12/2015 9:24:57 AM By: Regan Lemming BSN, RN Entered By: Regan Lemming on 04/12/2015 09:24:57 [name] ([id number]) -------------------------------------------------------------------------------- Encounter Discharge Information Details Patient Name: [name] Date of Service: 04/12/2015 9:30 AM Medical Record Number: [id number] Patient Account Number: [id number] Date of Birth/Sex: November 03, 1927 (81 y.o. Female) Treating RN: Afful, RN, BSN, Velva Harman Primary Care Physician: Constance Goltz Other Clinician: Referring Physician: Constance Goltz Treating Physician/Extender: Frann Rider in Treatment: 30 Encounter Discharge Information Items Discharge Pain Level: 0 Discharge Condition: Stable Ambulatory Status: Walker Discharge  Destination: Nursing Home Transportation: Other driver from Accompanied By: facility Schedule Follow-up Appointment: No Medication Reconciliation completed No and provided to Patient/Care Janaisha Tolsma: Provided on Clinical Summary of Care: 04/12/2015 Form Type Recipient Paper Patient DJ Electronic Signature(s) Signed: 04/12/2015 9:48:52 AM By: Regan Lemming BSN, RN Previous Signature: 04/12/2015 9:47:12 AM Version By: Ruthine Dose Entered By: Regan Lemming on 04/12/2015 09:48:52 [name] ([id number]) -------------------------------------------------------------------------------- Lower Extremity Assessment Details Patient Name: [name] Date of Service: 04/12/2015 9:30 AM Medical Record Number: [id number] Patient Account Number: [id number] Date of Birth/Sex: [birth year] (81 y.o. Female) Treating RN: Afful, RN, BSN, Velva Harman Primary Care Physician: Constance Goltz Other Clinician: Referring Physician: Constance Goltz Treating Physician/Extender: Frann Rider in Treatment: 30 Edema Assessment Assessed: [Left: No] [Right: No] Edema: [Left: N] [Right: o] Vascular Assessment Pulses: Posterior Tibial Dorsalis Pedis Palpable: [Left:No] Doppler: [Left:Monophasic] Extremity colors, hair growth, and conditions: Extremity Color: [Left:Normal] Hair Growth on Extremity: [Left:No] Temperature of Extremity: [Left:Warm] Capillary Refill: [Left:< 3 seconds] Toe Nail Assessment Left: Right: Thick: Yes Discolored: No Deformed: No Improper Length and Hygiene: No Electronic Signature(s) Signed: 04/12/2015 9:28:11 AM By: Regan Lemming BSN, RN Entered By: Regan Lemming on 04/12/2015 09:28:11 [name] ([id number]) -------------------------------------------------------------------------------- Multi Wound Chart Details Patient Name: [name] Date of Service: 04/12/2015 9:30 AM Medical Record Number: [id number] Patient Account Number: [id number] Date of Birth/Sex: 28-Mar-1928 (81  y.o. 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 N/A] Comorbid History: [6:Glaucoma, Asthma, Hypertension, Peripheral Venous Disease, Type II Diabetes] [N/A:N/A] Date Acquired: [6:09/26/2014] [N/A:N/A] Weeks of Treatment: [6:27] [N/A:N/A] Wound Status: [6:Open] [N/A:N/A] Measurements L x W x D 1x1x0.1 [N/A:N/A] (cm) Area (cm) : [6:0.785] [N/A:N/A] Volume (cm) : [6:0.079] [N/A:N/A] % Reduction  in Area: [6:79.20%] [N/A:N/A] % Reduction in Volume: 95.80% [N/A:N/A] Classification: [6:Partial Thickness] [N/A:N/A] HBO Classification: [6:Grade 1] [N/A:N/A] Exudate Amount: [6:Small] [N/A:N/A] Exudate Type: [6:Serous] [N/A:N/A] Exudate Color: [6:amber] [N/A:N/A] Wound Margin: [6:Distinct, outline attached N/A] Granulation Amount: [6:Medium (34-66%)] [N/A:N/A] Granulation Quality: [6:Pink, Pale] [N/A:N/A] Necrotic Amount: [6:None Present (0%)] [N/A:N/A] Exposed Structures: [6:Fascia: No Fat: No Tendon: No Muscle: No] [N/A:N/A] Joint: No Bone: No Limited to Skin Breakdown Epithelialization: Medium (34-66%) N/A N/A Periwound Skin Texture: Edema: Yes N/A N/A Excoriation: No Induration: No Callus: No Crepitus: No Fluctuance: No Friable: No Rash: No Scarring: No Periwound Skin Moist: Yes N/A N/A Moisture: Maceration: No Dry/Scaly: No Periwound Skin Color: Hemosiderin Staining: Yes N/A N/A Atrophie Blanche: No Cyanosis: No Ecchymosis: No Erythema: No Mottled: No Pallor:  No Rubor: No Temperature: No Abnormality N/A N/A Tenderness on No N/A N/A Palpation: Wound Preparation: Ulcer Cleansing: N/A N/A Rinsed/Irrigated with Saline Topical Anesthetic Applied: Other: lidocaine 4% Treatment Notes Electronic Signature(s) Signed: 04/12/2015 9:31:15 AM By: Regan Lemming BSN, RN Entered By: Regan Lemming on 04/12/2015 09:31:15 [name] ([id number]) -------------------------------------------------------------------------------- [place] Details Patient Name: [name] Date of Service: 04/12/2015 9:30 AM Medical Record Number: [id number] Patient Account Number: [id number] Date of Birth/Sex: 1927/08/26 (81 y.o. Female) Treating RN: Afful, RN, BSN, Velva Harman Primary Care Physician: Constance Goltz Other Clinician: Referring Physician: Constance Goltz Treating Physician/Extender: Frann Rider in Treatment: 30 Active Inactive Abuse / Safety / Falls / Self Care Management Nursing Diagnoses: Impaired physical mobility Potential for falls Goals: Patient will remain injury free Date Initiated: 09/12/2014 Goal Status: Active Patient/caregiver will verbalize understanding of skin care regimen Date Initiated: 09/12/2014 Goal Status: Active Patient/caregiver will verbalize/demonstrate measures taken to prevent injury and/or falls Date Initiated: 09/12/2014 Goal Status: Active Patient/caregiver will verbalize/demonstrate understanding of what to do in case of emergency Date Initiated: 09/12/2014 Goal Status: Active Interventions: Assess fall risk on admission and as needed Assess: immobility, friction, shearing, incontinence upon admission and as needed Assess impairment of mobility on admission and as needed per policy Provide education on basic hygiene Provide education on fall prevention Provide education on personal and home safety Provide education on safe transfers Treatment Activities: Education provided on Basic Hygiene :  10/12/2014 Notes: Necrotic Tissue LANGLEY, VANDERMEULEN ([id number]) Nursing Diagnoses: Impaired tissue integrity related to necrotic/devitalized tissue Knowledge deficit related to management of necrotic/devitalized tissue Goals: Necrotic/devitalized tissue will be minimized in the wound bed Date Initiated: 09/12/2014 Goal Status: Active Patient/caregiver will verbalize understanding of reason and process for debridement of necrotic tissue Date Initiated: 09/12/2014 Goal Status: Active Interventions: Assess patient pain level pre-, during and post procedure and prior to discharge Provide education on necrotic tissue and debridement process Treatment Activities: Apply topical anesthetic as ordered : 04/12/2015 Enzymatic debridement : 04/12/2015 Excisional debridement : 04/12/2015 Notes: Orientation to the Wound Care Program Nursing Diagnoses: Knowledge deficit related to the wound healing center program Goals: Patient/caregiver will verbalize understanding of the [facility] Date Initiated: 09/12/2014 Goal Status: Active Interventions: Provide education on orientation to the wound center Notes: Pressure Nursing Diagnoses: Knowledge deficit related to causes and risk factors for pressure ulcer development Knowledge deficit related to management of pressures ulcers Potential for impaired tissue integrity related to pressure, friction, moisture, and shear GoalsASTARIA, AUTREY ([id number]) Patient will remain free from development of additional pressure ulcers Date Initiated: 09/12/2014 Goal Status: Active Patient will remain free of pressure ulcers Date Initiated: 09/12/2014 Goal Status: Active Patient/caregiver will verbalize risk factors for pressure ulcer development Date  Initiated: 09/12/2014 Goal Status: Active Patient/caregiver will verbalize understanding of pressure ulcer management Date Initiated: 09/12/2014 Goal Status: Active Interventions: Assess:  immobility, friction, shearing, incontinence upon admission and as needed Assess offloading mechanisms upon admission and as needed Assess potential for pressure ulcer upon admission and as needed Provide education on pressure ulcers Treatment Activities: Pressure reduction/relief device ordered : 04/12/2015 Notes: Wound/Skin Impairment Nursing Diagnoses: Impaired tissue integrity Knowledge deficit related to ulceration/compromised skin integrity Goals: Patient/caregiver will verbalize understanding of skin care regimen Date Initiated: 09/12/2014 Goal Status: Active Ulcer/skin breakdown will heal within 14 weeks Date Initiated: 09/12/2014 Goal Status: Active Interventions: Assess patient/caregiver ability to obtain necessary supplies Assess patient/caregiver ability to perform ulcer/skin care regimen upon admission and as needed Assess ulceration(s) every visit Provide education on ulcer and skin care Treatment Activities: Skin care regimen initiated : 04/12/2015 KANESHIA, FORNSHELL ([id number]) Topical wound management initiated : 04/12/2015 Notes: Electronic Signature(s) Signed: 04/12/2015 9:31:07 AM By: Regan Lemming BSN, RN Entered By: Regan Lemming on 04/12/2015 09:31:07 [name] ([id number]) -------------------------------------------------------------------------------- Pain Assessment Details Patient Name: [name] Date of Service: 04/12/2015 9:30 AM Medical Record Number: [id number] Patient Account Number: [id number] Date of Birth/Sex: Nov 08, 1927 (81 y.o. Female) Treating RN: Baruch Gouty, RN, BSN, Velva Harman Primary Care Physician: Constance Goltz Other Clinician: Referring Physician: Constance Goltz Treating Physician/Extender: Frann Rider in Treatment: 30 Active Problems Location of Pain Severity and Description of Pain Patient Has Paino No Site Locations Pain Management and Medication Current Pain Management: Electronic Signature(s) Signed: 04/12/2015 9:25:51 AM By:  Regan Lemming BSN, RN Entered By: Regan Lemming on 04/12/2015 09:25:51 [name] ([id number]) -------------------------------------------------------------------------------- Patient/Caregiver Education Details Patient Name: [name] Date of Service: 04/12/2015 9:30 AM Medical Record Number: [id number] Patient Account Number: [id number] Date of Birth/Gender: March 29, 1928 (81 y.o. Female) Treating RN: Afful, RN, BSN, Velva Harman Primary Care Physician: Constance Goltz Other Clinician: Referring Physician: Constance Goltz Treating Physician/Extender: Frann Rider in Treatment: 30 Education Assessment Education Provided To: Patient Education Topics Provided Basic Hygiene: Methods: Explain/Verbal Responses: State content correctly Pressure: Methods: Explain/Verbal Responses: State content correctly Safety: Methods: Explain/Verbal Responses: State content correctly Welcome To [facility]: Methods: Explain/Verbal Responses: State content correctly Wound Debridement: Methods: Explain/Verbal Responses: State content correctly Wound/Skin Impairment: Methods: Explain/Verbal Responses: State content correctly Electronic Signature(s) Signed: 04/12/2015 9:49:24 AM By: Regan Lemming BSN, RN Entered By: Regan Lemming on 04/12/2015 09:49:23 [name] ([id number]) -------------------------------------------------------------------------------- Wound Assessment Details Patient Name: [name] Date of Service: 04/12/2015 9:30 AM Medical Record Number: [id number] Patient Account Number: [id number] Date of Birth/Sex: 30-Nov-1927 (81 y.o. Female) Treating RN: Afful, RN, BSN, Velva Harman Primary Care Physician: Constance Goltz Other Clinician: Referring Physician: Constance Goltz Treating Physician/Extender: Frann Rider in Treatment: 30 Wound Status Wound Number: 6 Primary Arterial Insufficiency Ulcer Etiology: Wound Location: Left Achilles Wound Open Wounding Event:  Gradually Appeared Status: Date Acquired: 09/26/2014 Comorbid Glaucoma, Asthma, Hypertension, Weeks Of Treatment: 27 History: Peripheral Venous Disease, Type II Clustered Wound: No Diabetes Wound Measurements Length: (cm) 1 Width: (cm) 1 Depth: (cm) 0.1 Area: (cm) 0.785 Volume: (cm) 0.079 % Reduction in Area: 79.2% % Reduction in Volume: 95.8% Epithelialization: Medium (34-66%) Tunneling: No Undermining: No Wound Description Classification: Partial Thickness Foul O Diabetic Severity (Wagner): Grade 1 Wound Margin: Distinct, outline attached Exudate Amount: Small Exudate Type: Serous Exudate Color: amber dor After Cleansing: No Wound Bed Granulation Amount: Medium (34-66%) Exposed Structure Granulation Quality: Pink, Pale Fascia Exposed: No Necrotic Amount: None Present (0%) Fat Layer Exposed: No Tendon Exposed: No Muscle Exposed: No  Joint Exposed: No Bone Exposed: No Limited to Skin Breakdown Periwound Skin Texture Texture Color No Abnormalities Noted: No No Abnormalities Noted: No Callus: No Atrophie Blanche: No Crepitus: No Cyanosis: No TALAH, SORSBY ([id number]) Excoriation: No Ecchymosis: No Fluctuance: No Erythema: No Friable: No Hemosiderin Staining: Yes Induration: No Mottled: No Localized Edema: Yes Pallor: No Rash: No Rubor: No Scarring: No Temperature / Pain Moisture Temperature: No Abnormality No Abnormalities Noted: No Dry / Scaly: No Maceration: No Moist: Yes Wound Preparation Ulcer Cleansing: Rinsed/Irrigated with Saline Topical Anesthetic Applied: Other: lidocaine 4%, Treatment Notes Wound #6 (Left Achilles) 1. Cleansed with: Clean wound with Normal Saline 3. Peri-wound Care: Skin Prep 4. Dressing Applied: Prisma Ag 5. Secondary Dressing Applied Bordered Foam Dressing Electronic Signature(s) Signed: 04/12/2015 9:29:53 AM By: Regan Lemming BSN, RN Entered By: Regan Lemming on 04/12/2015 09:29:53 [name]  ([id number]) -------------------------------------------------------------------------------- Vitals Details Patient Name: [name] Date of Service: 04/12/2015 9:30 AM Medical Record Number: [id number] Patient Account Number: [id number] Date of Birth/Sex: [birth year] (81 y.o. Female) Treating RN: Afful, RN, BSN, Velva Harman Primary Care Physician: Constance Goltz Other Clinician: Referring Physician: Constance Goltz Treating Physician/Extender: Frann Rider in Treatment: 30 Vital Signs Time Taken: 09:27 Temperature (F): 97.8 Height (in): 64 Pulse (bpm): 70 Weight (lbs): 180 Respiratory Rate (breaths/min): 16 Body Mass Index (BMI): 30.9 Blood Pressure (mmHg): 138/72 Reference Range: 80 - 120 mg / dl Electronic Signature(s) Signed: 04/12/2015 9:27:35 AM By: Regan Lemming BSN, RN Entered By: Regan Lemming on 04/12/2015 09:27:35

## 2015-04-13 NOTE — Progress Notes (Signed)
DANNE, HEBB ([id number]) Visit Report for 04/12/2015 Chief Complaint Document Details Patient Name: [name], [name] Date of Service: 04/12/2015 9:30 AM Medical Record Number: [id number] Patient Account Number: [id number] Date of Birth/Sex: 11/24/1927 (81 y.o. Female) Treating RN: Afful, RN, BSN, Velva Harman Primary Care Physician: Constance Goltz Other Clinician: Referring Physician: Constance Goltz Treating Physician/Extender: Frann Rider in Treatment: 30 Information Obtained from: Patient Chief Complaint Patient presents to the wound care center today with an open arterial ulcer on the left foot big toe and lateral heal. She also has some superficial ulcerations on the second and third toe dorsum. she has no family member with her today and she is a very poor historian and cannot give a proper history. As stated that she walks around with a walker. Electronic Signature(s) Signed: 04/12/2015 9:41:42 AM By: Christin Fudge MD, FACS Entered By: Christin Fudge on 04/12/2015 09:41:42 [name] ([id number]) -------------------------------------------------------------------------------- Debridement Details Patient Name: [name] Date of Service: 04/12/2015 9:30 AM Medical Record Number: [id number] Patient Account Number: [id number] Date of Birth/Sex: Oct 21, 1927 (81 y.o. Female) Treating RN: Afful, RN, BSN, Velva Harman Primary Care Physician: Constance Goltz Other Clinician: Referring Physician: Constance Goltz Treating Physician/Extender: Frann Rider in Treatment: 30 Debridement Performed for Wound #6 Left Achilles Assessment: Performed By: Physician Pat Patrick., MD Debridement: Debridement Pre-procedure Yes Verification/Time Out Taken: Start Time: 09:35 Pain Control: Lidocaine 4% Topical Solution Level: Skin/Subcutaneous Tissue Total Area Debrided (L x 1 (cm) x 1 (cm) = 1 (cm) W): Tissue and other Viable, Non-Viable, Fibrin/Slough, Subcutaneous material  debrided: Instrument: Curette Bleeding: Minimum Hemostasis Achieved: Pressure End Time: 09:39 Procedural Pain: 0 Post Debridement Measurements of Total Wound Length: (cm) 1 Width: (cm) 1 Depth: (cm) 0.1 Volume: (cm) 0.079 Post Procedure Diagnosis Same as Pre-procedure Notes The edges of the wound have been curetted and the base of the ulcer is freshened with a curette. There is brisk bleeding which is controlled with pressure. Electronic Signature(s) Signed: 04/12/2015 9:41:31 AM By: Christin Fudge MD, FACS Signed: 04/12/2015 4:35:05 PM By: Regan Lemming BSN, RN Previous Signature: 04/12/2015 9:40:12 AM Version By: Regan Lemming BSN, RN Entered By: Christin Fudge on 04/12/2015 09:41:31 [name], [name] ([id number]) -------------------------------------------------------------------------------- HPI Details Patient Name: [name] Date of Service: 04/12/2015 9:30 AM Medical Record Number: [id number] Patient Account Number: [id number] Date of Birth/Sex: Sep 15, 1927 (81 y.o. Female) Treating RN: Baruch Gouty, RN, BSN, Velva Harman Primary Care Physician: Constance Goltz Other Clinician: Referring Physician: Constance Goltz Treating Physician/Extender: Frann Rider in Treatment: 30 History of Present Illness Location: left heel Quality: Patient reports experiencing a dull pain to affected area(s). Severity: Patient states wound are getting worse. Duration: Patient states that they are not certain how long the wound has been present. Timing: Pain in wound is Intermittent (comes and goes Context: The wound appeared gradually over time Associated Signs and Symptoms: Patient reports having difficulty standing for long periods. HPI Description: This 81 year old patient is a poor historian and comes from a nursing home. Does not have any family members with her. Says she has a ulcer on the lateral part of her left foot and some new ones are there on her right foot too. She is unable to say how long  she's had these. she does walk with a walker and this is limited most of the day. Does not recall if she has had any vascular studies done. I have reviewed an x-ray done of the left foot which basically shows osteoporosis but no evidence of fracture dislocation or osteomyelitis. 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 She seems to be doing better overall. 10/19/14 -- I understand she has gone to the vascular surgery and lab yesterday and has had all her tests done yesterday. She is doing fine otherwise and has had no fresh issues. 10/26/2014 she seems to be doing well and has no fresh problems and seems much brighter. 10/12/14 -- Taking her antibiotic and continues to do her dressings locally and she is helped by her skilled nursing. She seems to be doing fine and has no fresh issues. 10/19/14 -- she is doing fine and has had no fresh issues. She says her son to go for her vascular workup yesterday and she has been called back after 3 months. we will try and gather these reports to review what they said. 10/26/14 --I have been able to review notes from the vascular surgery office and these were dated from 10/18/2014. He patient was on her first postop visit and had Doppler ultrasounds done of her arteries. She had more than 50% stenosis of the right superficial femoral artery and more than 50% stenosis in the left tibioperoneal trunk. She was status post a left lower extremity angiogram with angioplasty of the peroneal and left superficial femoral arteries for a nonhealing left foot and ankle ulceration. note is made of the fact that the ABIs had not changed in the last month since her previous visit. The opinion of her provider was that she did not need any intervention at this time and they had done everything they could at the  present time. They would continue on Plavix and antibiotics and see her back in 3 months for a another arterial duplex study of the lower extremities. [name], [name] ([id number]) 11/02/2014 -- she has spoken to her caregivers about coming for HBO 5 times per week for 6-8 weeks and they are agreeable about this and she is willing to undergo hyperbaric oxygen therapy. 11/16/2014 she has been worked up with a EKG and chest x-ray and these were within normal limits. As far as the investigations go she is ready for HBOT. we are awaiting some insurance clearance so that she can start on her hyperbaric oxygen therapy. 11/30/2014 she tolerated hyperbaric oxygen therapy fine and there were no issues with her blood glucose levels. As noted she is not a diabetic and most of her fingerstick blood glucose levels are inaccurate because of the Raynaud's phenomena. She normally has to have your lobe sticks to get any random glucose levels. I have also requested her primary care does a venous blood glucose draw to check her glucose level. 12/21/2014 -- she is tolerating hyperbaric oxygen very well and has overall made a lot of progress. Her left leg is looking much better. 01/11/2015 - No new complaints today. New ulceration on the dorsum of the right second toe noted today on physical exam. Patient unaware. No significant pain. No fever or chills. No significant drainage. Offloading with sage boots at night. Significantly improved with HBO. 01/19/2015 - the patient has had a x-ray of the right foot done at the nursing home but we still do not have the report and we are awaiting this result. She is otherwise doing fine with her general health. 01/25/2015 -- X-ray of her right foot showed no acute fracture, dislocation, bony infection or osteomyelitis. The was some soft  tissue swelling and osteopenia seen. 02/15/2015 -- due to some insurance issues we could not get her epifix and we are working with her company  to get her this. She is doing fine otherwise and has no fresh issues. 04/04/2015 -- she had a follow-up with the vascular surgeon and he seemed to be pleased with her progress and has had no problems with her vascularity. Electronic Signature(s) Signed: 04/12/2015 9:41:53 AM By: Christin Fudge MD, FACS Entered By: Christin Fudge on 04/12/2015 09:41:52 [name], [name] ([id number]) -------------------------------------------------------------------------------- Physical Exam Details Patient Name: [name] Date of Service: 04/12/2015 9:30 AM Medical Record Number: [id number] Patient Account Number: [id number] Date of Birth/Sex: 01/15/1928 (81 y.o. Female) Treating RN: Afful, RN, BSN, Velva Harman Primary Care Physician: Constance Goltz Other Clinician: Referring Physician: Constance Goltz Treating Physician/Extender: Frann Rider in Treatment: 30 Constitutional . Pulse regular. Respirations normal and unlabored. Afebrile. . Eyes Nonicteric. Reactive to light. Ears, Nose, Mouth, and Throat Lips, teeth, and gums WNL.Marland Kitchen Moist mucosa without lesions . Neck supple and nontender. No palpable supraclavicular or cervical adenopathy. Normal sized without goiter. Respiratory WNL. No retractions.. Cardiovascular Pedal Pulses WNL. No clubbing, cyanosis . minimal edema around her ankles.. Lymphatic No adneopathy. No adenopathy. No adenopathy. Musculoskeletal Adexa without tenderness or enlargement.. Digits and nails w/o clubbing, cyanosis, infection, petechiae, ischemia, or inflammatory conditions.. Integumentary (Hair, Skin) No suspicious lesions. No crepitus or fluctuance. No peri-wound warmth or erythema. No masses.Marland Kitchen Psychiatric Judgement and insight Intact.. No evidence of depression, anxiety, or agitation.. Notes She has a clean base of the ulcer with slight amount of slough at the edges which will be sharply freshened with a curette. Electronic Signature(s) Signed: 04/12/2015 9:43:02 AM By:  Christin Fudge MD, FACS Entered By: Christin Fudge on 04/12/2015 09:43:01 [name] ([id number]) -------------------------------------------------------------------------------- Physician Orders Details Patient Name: [name] Date of Service: 04/12/2015 9:30 AM Medical Record Number: [id number] Patient Account Number: [id number] Date of Birth/Sex: Oct 29, 1927 (81 y.o. Female) Treating RN: Afful, RN, BSN, Velva Harman Primary Care Physician: Constance Goltz Other Clinician: Referring Physician: Constance Goltz Treating Physician/Extender: Frann Rider in Treatment: 58 Verbal / Phone Orders: Yes Clinician: Afful, RN, BSN, Rita Read Back and Verified: Yes Diagnosis Coding Wound Cleansing Wound #6 Left Achilles o Clean wound with Normal Saline. - be sure to clean out all remaining prisma Anesthetic Wound #6 Left Achilles o Topical Lidocaine 4% cream applied to wound bed prior to debridement Primary Wound Dressing Wound #6 Left Achilles o Prisma Ag - or collagen with silver equivalent - triple layer please Secondary Dressing Wound #6 Left Achilles o Boardered Foam Dressing Dressing Change Frequency Wound #6 Left Achilles o Change dressing every other day. Follow-up Appointments Wound #6 Left Achilles o Return Appointment in 1 week. Off-Loading Wound #6 Left Achilles o Other: - SAGE boots while sitting or lying. Do not wear while ambulating. Home Health Wound #6 Left Achilles o Continue Home Health Visits - [place] Nurse may visit PRN to address patientos wound care needs. o FACE TO FACE ENCOUNTER: MEDICARE and MEDICAID PATIENTS: I certify that this patient is under my care and that I had a face-to-face encounter that meets the physician face-to-face encounter requirements with this patient on this date. The encounter with the patient was in [place], [place] ([id number]) whole or in part for the following MEDICAL CONDITION: (primary reason for  [place]) MEDICAL NECESSITY: I certify, that based on my findings, NURSING services are a medically necessary home health service. HOME BOUND STATUS: I certify that  my clinical findings support that this patient is homebound (i.e., Due to illness or injury, pt requires aid of supportive devices such as crutches, cane, wheelchairs, walkers, the use of special transportation or the assistance of another person to leave their place of residence. There is a normal inability to leave the home and doing so requires considerable and taxing effort. Other absences are for medical reasons / religious services and are infrequent or of short duration when for other reasons). o If current dressing causes regression in wound condition, may D/C ordered dressing product/s and apply Normal Saline Moist Dressing daily until next [place] / Other MD appointment. [place] of regression in wound condition at [phone]. o Please direct any NON-WOUND related issues/requests for orders to patient's Primary Care Physician Electronic Signature(s) Signed: 04/12/2015 9:43:54 AM By: Regan Lemming BSN, RN Signed: 04/12/2015 4:07:43 PM By: Christin Fudge MD, FACS Entered By: Regan Lemming on 04/12/2015 09:43:53 [name] ([id number]) -------------------------------------------------------------------------------- Problem List Details Patient Name: [name] Date of Service: 04/12/2015 9:30 AM Medical Record Number: [id number] Patient Account Number: [id number] Date of Birth/Sex: October 23, 1927 (81 y.o. 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/12/2014 Yes layer  exposed 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 09/12/2014 Yes of heel and midfoot I70.235 Atherosclerosis of native arteries of right leg with 09/12/2014 Yes ulceration of other part of foot Inactive Problems Resolved Problems ICD-10 Code Description Active Date Resolved Date L97.412 Non-pressure chronic ulcer of right heel and midfoot with 10/26/2014 10/26/2014 fat layer exposed S91.104A Unspecified open wound of right lesser toe(s) without 01/11/2015 01/11/2015 damage to nail, initial encounter Electronic Signature(s) Signed: 04/12/2015 9:40:40 AM By: Christin Fudge MD, FACS Entered By: Christin Fudge on 04/12/2015 09:40:39 [name] ([id number]) -------------------------------------------------------------------------------- Progress Note Details Patient Name: [name] Date of Service: 04/12/2015 9:30 AM Medical Record Number: [id number] Patient Account Number: [id number] Date of Birth/Sex: September 02, 1927 (81 y.o. Female) Treating RN: Afful, RN, BSN, Velva Harman Primary Care Physician: Constance Goltz Other Clinician: Referring Physician: Constance Goltz Treating Physician/Extender: Frann Rider in Treatment: 30 Subjective Chief Complaint Information obtained from Patient Patient presents to the wound care center today with an open arterial ulcer on the left foot big toe and lateral heal. She also has some superficial ulcerations on the second and third toe dorsum. she has no family member with her today and she is a very poor historian and cannot give a proper history. As stated that she walks around with a walker. History of Present Illness (HPI) The following HPI elements were documented for the patient's wound: Location: left heel Quality: Patient reports experiencing a dull pain to affected area(s). Severity: Patient states wound are getting worse. Duration: Patient states that they are not certain how long the wound has been present. Timing:  Pain in wound is Intermittent (comes and goes Context: The wound appeared gradually over time Associated Signs and Symptoms: Patient reports having difficulty standing for long periods. This 80 year old patient is a poor historian and comes from a nursing home. Does not have any family members with her. Says she has a ulcer on the lateral part of her left foot and some new ones are there on her right foot too. She is unable to say how long she's had  these. she does walk with a walker and this is limited most of the day. Does not recall if she has had any vascular studies done. I have reviewed an x-ray done of the left foot which basically shows osteoporosis but no evidence of fracture dislocation or osteomyelitis. 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 She seems to be doing better overall. 10/19/14 -- I understand she has gone to the vascular surgery and lab yesterday and has had all her tests done yesterday. She is doing fine otherwise and has had no fresh issues. 10/26/2014 she seems to be doing well and has no fresh problems and seems much brighter. 10/12/14 -- Taking her antibiotic and continues to do her dressings locally and she is helped by her skilled nursing. She seems to be doing fine and has no fresh issues. 10/19/14 -- she is doing fine and has had no fresh issues. She says her son to go for her vascular workup yesterday and she has been called back after 3 months. we will try and gather these reports to review what [name], [name] ([id number]) they said. 10/26/14 --I have been able to review notes from the vascular surgery office and these were dated from 10/18/2014. He patient was on her first postop visit and had Doppler ultrasounds done of her arteries. She had more than 50% stenosis of the right superficial  femoral artery and more than 50% stenosis in the left tibioperoneal trunk. She was status post a left lower extremity angiogram with angioplasty of the peroneal and left superficial femoral arteries for a nonhealing left foot and ankle ulceration. note is made of the fact that the ABIs had not changed in the last month since her previous visit. The opinion of her provider was that she did not need any intervention at this time and they had done everything they could at the present time. They would continue on Plavix and antibiotics and see her back in 3 months for a another arterial duplex study of the lower extremities. 11/02/2014 -- she has spoken to her caregivers about coming for HBO 5 times per week for 6-8 weeks and they are agreeable about this and she is willing to undergo hyperbaric oxygen therapy. 11/16/2014 she has been worked up with a EKG and chest x-ray and these were within normal limits. As far as the investigations go she is ready for HBOT. we are awaiting some insurance clearance so that she can start on her hyperbaric oxygen therapy. 11/30/2014 she tolerated hyperbaric oxygen therapy fine and there were no issues with her blood glucose levels. As noted she is not a diabetic and most of her fingerstick blood glucose levels are inaccurate because of the Raynaud's phenomena. She normally has to have your lobe sticks to get any random glucose levels. I have also requested her primary care does a venous blood glucose draw to check her glucose level. 12/21/2014 -- she is tolerating hyperbaric oxygen very well and has overall made a lot of progress. Her left leg is looking much better. 01/11/2015 - No new complaints today. New ulceration on the dorsum of the right second toe noted today on physical exam. Patient unaware. No significant pain. No fever or chills. No significant drainage. Offloading with sage boots at night. Significantly improved with HBO. 01/19/2015 - the patient has  had a  x-ray of the right foot done at the nursing home but we still do not have the report and we are awaiting this result. She is otherwise doing fine with her general health. 01/25/2015 -- X-ray of her right foot showed no acute fracture, dislocation, bony infection or osteomyelitis. The was some soft tissue swelling and osteopenia seen. 02/15/2015 -- due to some insurance issues we could not get her epifix and we are working with her company to get her this. She is doing fine otherwise and has no fresh issues. 04/04/2015 -- she had a follow-up with the vascular surgeon and he seemed to be pleased with her progress and has had no problems with her vascularity. [place], [place] ([id number]) Constitutional Pulse regular. Respirations normal and unlabored. Afebrile. Vitals Time Taken: 9:27 AM, Height: 64 in, Weight: 180 lbs, BMI: 30.9, Temperature: 97.8 F, Pulse: 70 bpm, Respiratory Rate: 16 breaths/min, Blood Pressure: 138/72 mmHg. Eyes Nonicteric. Reactive to light. Ears, Nose, Mouth, and Throat Lips, teeth, and gums WNL.Marland Kitchen Moist mucosa without lesions . Neck supple and nontender. No palpable supraclavicular or cervical adenopathy. Normal sized without goiter. Respiratory WNL. No retractions.. Cardiovascular Pedal Pulses WNL. No clubbing, cyanosis . minimal edema around her ankles.. Lymphatic No adneopathy. No adenopathy. No adenopathy. Musculoskeletal Adexa without tenderness or enlargement.. Digits and nails w/o clubbing, cyanosis, infection, petechiae, ischemia, or inflammatory conditions.Marland Kitchen Psychiatric Judgement and insight Intact.. No evidence of depression, anxiety, or agitation.. General Notes: She has a clean base of the ulcer with slight amount of slough at the edges which will be sharply freshened with a curette. Integumentary (Hair, Skin) No suspicious lesions. No crepitus or fluctuance. No peri-wound warmth or erythema. No masses.. Wound #6 status is Open.  Original cause of wound was Gradually Appeared. The wound is located on the Left Achilles. The wound measures 1cm length x 1cm width x 0.1cm depth; 0.785cm^2 area and 0.079cm^3 volume. The wound is limited to skin breakdown. There is no tunneling or undermining noted. There is a small amount of serous drainage noted. The wound margin is distinct with the outline attached to the wound base. There is medium (34-66%) pink, pale granulation within the wound bed. There is no necrotic tissue within the wound bed. The periwound skin appearance exhibited: Localized Edema, Moist, Hemosiderin Staining. The periwound skin appearance did not exhibit: Callus, Crepitus, Excoriation, Fluctuance, Friable, Induration, Rash, Scarring, Dry/Scaly, Maceration, Atrophie Blanche, Cyanosis, Ecchymosis, Mottled, Pallor, Rubor, Erythema. Periwound temperature was noted as No Abnormality. [place], [place] ([id number]) Assessment Active Problems ICD-10 [phone] - Non-pressure chronic ulcer of left heel and midfoot with fat layer exposed I70.244 - Atherosclerosis of native arteries of left leg with ulceration of heel and midfoot I70.235 - Atherosclerosis of native arteries of right leg with ulceration of other part of foot After freshening the edges and the base of the ulcer we will continue treating her with silver collagen on alternate days. this particular wound has been very slow to respond to treatment and we will continue to monitor it closely on a weekly basis. Nutrition, vitamins and off loading has been discussed with her in detail. Procedures Wound #6 Wound #6 is an Arterial Insufficiency Ulcer located on the Left Achilles . There was a Skin/Subcutaneous Tissue Debridement [id number]) debridement with total area of 1 sq cm performed by Pat Patrick., MD. with the following instrument(s): Curette to remove Viable and Non-Viable tissue/material including Fibrin/Slough and Subcutaneous after achieving pain  control using Lidocaine 4% Topical Solution. A time out was conducted prior  to the start of the procedure. A Minimum amount of bleeding was controlled with Pressure. The patient tolerated the procedure with a pain level of 0 throughout. Post Debridement Measurements: 1cm length x 1cm width x 0.1cm depth; 0.079cm^3 volume. Post procedure Diagnosis Wound #6: Same as Pre-Procedure General Notes: The edges of the wound have been curetted and the base of the ulcer is freshened with a curette. There is brisk bleeding which is controlled with pressure.. Plan After freshening the edges and the base of the ulcer we will continue treating her with silver collagen on alternate days. this particular wound has been very slow to respond to treatment and we will continue to monitor it closely on a weekly basis. Nutrition, vitamins and off loading has been discussed with her in detail. [name], [name] ([id number]) Electronic Signature(s) Signed: 04/12/2015 9:44:07 AM By: Christin Fudge MD, FACS Entered By: Christin Fudge on 04/12/2015 09:44:07 [name], [name] ([id number]) -------------------------------------------------------------------------------- SuperBill Details Patient Name: [name] Date of Service: 04/12/2015 Medical Record Number: [id number] Patient Account Number: [id number] Date of Birth/Sex: 12/24/1927 (81 y.o. 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 I70.235 Atherosclerosis of native arteries of right leg with ulceration of other part of foot Facility Procedures CPT4: Description Modifier Quantity Code [id number]  [id number] - DEB SUBQ TISSUE 20 SQ CM/< 1 ICD-10 Description Diagnosis 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 of native arteries of right leg with ulceration of other part of foot Physician Procedures CPT4: Description Modifier Quantity Code [id number] - WC PHYS SUBQ TISS 20 SQ CM 1 ICD-10 Description Diagnosis 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 of native arteries of right leg with ulceration of other part of foot Electronic Signature(s) Signed: 04/12/2015 9:44:19 AM By: Christin Fudge MD, FACS Entered By: Christin Fudge on 04/12/2015 09:44:19

## 2015-04-19 ENCOUNTER — Encounter: Payer: Medicare Other | Attending: Surgery | Admitting: Surgery

## 2015-04-19 DIAGNOSIS — I70201 Unspecified atherosclerosis of native arteries of extremities, right leg: Secondary | ICD-10-CM | POA: Diagnosis not present

## 2015-04-19 DIAGNOSIS — I70244 Atherosclerosis of native arteries of left leg with ulceration of heel and midfoot: Secondary | ICD-10-CM | POA: Diagnosis not present

## 2015-04-19 DIAGNOSIS — L97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: Secondary | ICD-10-CM | POA: Diagnosis not present

## 2015-04-19 NOTE — Progress Notes (Signed)
[name], [name] Visit Report for 04/19/2015 Arrival Information Details Patient Name: [name], [name] Date of Service: 04/19/2015 9:30 AM Medical Record Number: [id number] Patient Account Number: [id number] Date of Birth/Sex: [birth year] (81 y.o. Female) Treating RN: Afful, RN, BSN, Velva Harman Primary Care Physician: Constance Goltz Other Clinician: Referring Physician: Constance Goltz Treating Physician/Extender: Frann Rider in Treatment: 35 Visit Information History Since Last Visit Any new allergies or adverse reactions: No Patient Arrived: Wheel Chair Had a fall or experienced change in No Arrival Time: 09:39 activities of daily living that may affect Accompanied By: driver risk of falls: Transfer Assistance: None Signs or symptoms of abuse/neglect since last No Patient Identification Verified: Yes visito Secondary Verification Process Yes Hospitalized since last visit: No Completed: Has Dressing in Place as Prescribed: Yes Patient Requires Transmission- No Pain Present Now: No Based Precautions: Patient Has Alerts: Yes Patient Alerts: Patient on Blood Thinner Electronic Signature(s) Signed: 04/19/2015 9:41:15 AM By: Regan Lemming BSN, RN Entered By: Regan Lemming on 04/19/2015 09:41:15 [name] ([id number]) -------------------------------------------------------------------------------- Clinic Level of Care Assessment Details Patient Name: [name] Date of Service: 04/19/2015 9:30 AM Medical Record Number: [id number] Patient Account Number: [id number] Date of Birth/Sex: 1927-09-11 (81 y.o. Female) Treating RN: Afful, RN, BSN, Velva Harman Primary Care Physician: Constance Goltz Other Clinician: Referring Physician: Constance Goltz Treating Physician/Extender: Frann Rider in Treatment: 31 Clinic Level of Care Assessment Items TOOL 4 Quantity Score []  - Use when only an EandM is performed on FOLLOW-UP visit 0 ASSESSMENTS - Nursing Assessment / Reassessment X  - Reassessment of Co-morbidities (includes updates in patient status) 1 10 X - Reassessment of Adherence to Treatment Plan 1 5 ASSESSMENTS - Wound and Skin Assessment / Reassessment X - Simple Wound Assessment / Reassessment - one wound 1 5 []  - Complex Wound Assessment / Reassessment - multiple wounds 0 []  - Dermatologic / Skin Assessment (not related to wound area) 0 ASSESSMENTS - Focused Assessment []  - Circumferential Edema Measurements - multi extremities 0 []  - Nutritional Assessment / Counseling / Intervention 0 X - Lower Extremity Assessment (monofilament, tuning fork, pulses) 1 5 []  - Peripheral Arterial Disease Assessment (using hand held doppler) 0 ASSESSMENTS - Ostomy and/or Continence Assessment and Care []  - Incontinence Assessment and Management 0 []  - Ostomy Care Assessment and Management (repouching, etc.) 0 PROCESS - Coordination of Care X - Simple Patient / Family Education for ongoing care 1 15 []  - Complex (extensive) Patient / Family Education for ongoing care 0 []  - Staff obtains Programmer, systems, Records, Test Results / Process Orders 0 []  - Staff telephones HHA, Nursing Homes / Clarify orders / etc 0 []  - Routine Transfer to another Facility (non-emergent condition) 0 [place], [place] ([id number]) []  - Routine Hospital Admission (non-emergent condition) 0 []  - New Admissions / Biomedical engineer / Ordering NPWT, Apligraf, etc. 0 []  - Emergency Hospital Admission (emergent condition) 0 []  - Simple Discharge Coordination 0 []  - Complex (extensive) Discharge Coordination 0 PROCESS - Special Needs []  - Pediatric / Minor Patient Management 0 []  - Isolation Patient Management 0 []  - Hearing / Language / Visual special needs 0 []  - Assessment of Community assistance (transportation, D/C planning, etc.) 0 []  - Additional assistance / Altered mentation 0 []  - Support Surface(s) Assessment (bed, cushion, seat, etc.) 0 INTERVENTIONS - Wound Cleansing / Measurement X -  Simple Wound Cleansing - one wound 1 5 []  - Complex Wound Cleansing - multiple wounds 0 X - Wound Imaging (photographs - any number of wounds) 1 5 []  -  Wound Tracing (instead of photographs) 0 X - Simple Wound Measurement - one wound 1 5 []  - Complex Wound Measurement - multiple wounds 0 INTERVENTIONS - Wound Dressings X - Small Wound Dressing one or multiple wounds 1 10 []  - Medium Wound Dressing one or multiple wounds 0 []  - Large Wound Dressing one or multiple wounds 0 []  - Application of Medications - topical 0 []  - Application of Medications - injection 0 INTERVENTIONS - Miscellaneous []  - External ear exam 0 DELINAH, HUMBERT ([id number]) []  - Specimen Collection (cultures, biopsies, blood, body fluids, etc.) 0 []  - Specimen(s) / Culture(s) sent or taken to Lab for analysis 0 []  - Patient Transfer (multiple staff / Harrel Lemon Lift / Similar devices) 0 []  - Simple Staple / Suture removal (25 or less) 0 []  - Complex Staple / Suture removal (26 or more) 0 []  - Hypo / Hyperglycemic Management (close monitor of Blood Glucose) 0 []  - Ankle / Brachial Index (ABI) - do not check if billed separately 0 X - Vital Signs 1 5 Has the patient been seen at the hospital within the last three years: Yes Total Score: 70 Level Of Care: New/Established - Level 2 Electronic Signature(s) Signed: 04/19/2015 10:05:27 AM By: Regan Lemming BSN, RN Entered By: Regan Lemming on 04/19/2015 10:05:26 [name] ([id number]) -------------------------------------------------------------------------------- Encounter Discharge Information Details Patient Name: [name] Date of Service: 04/19/2015 9:30 AM Medical Record Number: [id number] Patient Account Number: [id number] Date of Birth/Sex: 01-May-1928 (81 y.o. Female) Treating RN: Baruch Gouty, RN, BSN, Velva Harman Primary Care Physician: Constance Goltz Other Clinician: Referring Physician: Constance Goltz Treating Physician/Extender: Frann Rider in Treatment:  69 Encounter Discharge Information Items Discharge Pain Level: 0 Discharge Condition: Stable Ambulatory Status: Ambulatory Discharge Destination: Nursing Home Transportation: Other driver from Accompanied By: facility Schedule Follow-up Appointment: No Medication Reconciliation completed and provided to Patient/Care No Maile Linford: Provided on Clinical Summary of Care: 04/19/2015 Form Type Recipient Paper Patient DJ Electronic Signature(s) Signed: 04/19/2015 10:06:39 AM By: Regan Lemming BSN, RN Previous Signature: 04/19/2015 10:02:51 AM Version By: Ruthine Dose Entered By: Regan Lemming on 04/19/2015 10:06:38 [name] ([id number]) -------------------------------------------------------------------------------- Lower Extremity Assessment Details Patient Name: [name] Date of Service: 04/19/2015 9:30 AM Medical Record Number: [id number] Patient Account Number: [id number] Date of Birth/Sex: [birth year] (81 y.o. Female) Treating RN: Afful, RN, BSN, Velva Harman Primary Care Physician: Constance Goltz Other Clinician: Referring Physician: Constance Goltz Treating Physician/Extender: Frann Rider in Treatment: 31 Vascular Assessment Pulses: Posterior Tibial Dorsalis Pedis Palpable: [Left:No] Doppler: [Left:Monophasic] Extremity colors, hair growth, and conditions: Extremity Color: [Left:Normal] Hair Growth on Extremity: [Left:No] Temperature of Extremity: [Left:Warm] Capillary Refill: [Left:< 3 seconds] Toe Nail Assessment Left: Right: Thick: Yes Discolored: No Deformed: No Improper Length and Hygiene: No Electronic Signature(s) Signed: 04/19/2015 9:42:16 AM By: Regan Lemming BSN, RN Entered By: Regan Lemming on 04/19/2015 09:42:16 [name] ([id number]) -------------------------------------------------------------------------------- Multi Wound Chart Details Patient Name: [name] Date of Service: 04/19/2015 9:30 AM Medical Record Number: [id number] Patient Account  Number: [id number] Date of Birth/Sex: Dec 28, 1927 (81 y.o. 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 N/A] Comorbid History: [6:Glaucoma, Asthma, Hypertension, Peripheral Venous Disease, Type II Diabetes] [N/A:N/A] Date Acquired: [  6:09/26/2014] [N/A:N/A] Weeks of Treatment: [6:28] [N/A:N/A] Wound Status: [6:Open] [N/A:N/A] Measurements L x W x D 1x1.4x0.1 [N/A:N/A] (cm) Area (cm) : [6:1.1] [N/A:N/A] Volume (cm) : [6:0.11] [N/A:N/A] % Reduction in Area: [6:70.80%] [N/A:N/A] % Reduction in Volume: 94.20% [N/A:N/A] Classification: [6:Partial Thickness] [N/A:N/A] HBO Classification: [6:Grade 1] [N/A:N/A] Exudate Amount: [6:Small] [N/A:N/A] Exudate Type: [6:Serous] [N/A:N/A] Exudate Color: [6:amber] [N/A:N/A] Wound Margin: [6:Distinct, outline attached N/A] Granulation Amount: [6:Medium (34-66%)] [N/A:N/A] Granulation Quality: [6:Pink, Pale] [N/A:N/A] Necrotic Amount: [6:None Present (0%)] [N/A:N/A] Exposed Structures: [6:Fascia: No Fat: No Tendon: No Muscle: No] [N/A:N/A] Joint: No Bone: No Limited to Skin Breakdown Epithelialization: Medium (34-66%) N/A N/A Periwound Skin Texture: Edema: Yes N/A N/A Excoriation: No Induration: No Callus: No Crepitus: No Fluctuance: No Friable: No Rash: No Scarring: No Periwound Skin Moist: Yes N/A N/A Moisture: Maceration: No Dry/Scaly: No Periwound Skin Color: Hemosiderin Staining: Yes N/A N/A Atrophie Blanche: No Cyanosis:  No Ecchymosis: No Erythema: No Mottled: No Pallor: No Rubor: No Temperature: No Abnormality N/A N/A Tenderness on No N/A N/A Palpation: Wound Preparation: Ulcer Cleansing: N/A N/A Rinsed/Irrigated with Saline Topical Anesthetic Applied: Other: lidocaine 4% Treatment Notes Electronic Signature(s) Signed: 04/19/2015 9:55:05 AM By: Regan Lemming BSN, RN Entered By: Regan Lemming on 04/19/2015 09:55:04 [name] ([id number]) -------------------------------------------------------------------------------- [place] Details Patient Name: [name] Date of Service: 04/19/2015 9:30 AM Medical Record Number: [id number] Patient Account Number: [id number] Date of Birth/Sex: 03/07/1928 (81 y.o. Female) Treating RN: Afful, RN, BSN, Velva Harman Primary Care Physician: Constance Goltz Other Clinician: Referring Physician: Constance Goltz Treating Physician/Extender: Frann Rider in Treatment: 87 Active Inactive Abuse / Safety / Falls / Self Care Management Nursing Diagnoses: Impaired physical mobility Potential for falls Goals: Patient will remain injury free Date Initiated: 09/12/2014 Goal Status: Active Patient/caregiver will verbalize understanding of skin care regimen Date Initiated: 09/12/2014 Goal Status: Active Patient/caregiver will verbalize/demonstrate measures taken to prevent injury and/or falls Date Initiated: 09/12/2014 Goal Status: Active Patient/caregiver will verbalize/demonstrate understanding of what to do in case of emergency Date Initiated: 09/12/2014 Goal Status: Active Interventions: Assess fall risk on admission and as needed Assess: immobility, friction, shearing, incontinence upon admission and as needed Assess impairment of mobility on admission and as needed per policy Provide education on basic hygiene Provide education on fall prevention Provide education on personal and home safety Provide education on safe transfers Treatment  Activities: Education provided on Basic Hygiene : 10/12/2014 Notes: Necrotic Tissue KENIYAH, HOUCK ([id number]) Nursing Diagnoses: Impaired tissue integrity related to necrotic/devitalized tissue Knowledge deficit related to management of necrotic/devitalized tissue Goals: Necrotic/devitalized tissue will be minimized in the wound bed Date Initiated: 09/12/2014 Goal Status: Active Patient/caregiver will verbalize understanding of reason and process for debridement of necrotic tissue Date Initiated: 09/12/2014 Goal Status: Active Interventions: Assess patient pain level pre-, during and post procedure and prior to discharge Provide education on necrotic tissue and debridement process Treatment Activities: Apply topical anesthetic as ordered : 04/19/2015 Enzymatic debridement : 04/19/2015 Excisional debridement : 04/19/2015 Notes: Orientation to the Wound Care Program Nursing Diagnoses: Knowledge deficit related to the wound healing center program Goals: Patient/caregiver will verbalize understanding of the [facility] Date Initiated: 09/12/2014 Goal Status: Active Interventions: Provide education on orientation to the wound center Notes: Pressure Nursing Diagnoses: Knowledge deficit related to causes and risk factors for pressure ulcer development Knowledge deficit related to management of pressures ulcers Potential for impaired tissue integrity related to pressure, friction, moisture, and shear GoalsYOVANNA, TYLICKI ([id number]) Patient will remain free from development of  additional pressure ulcers Date Initiated: 09/12/2014 Goal Status: Active Patient will remain free of pressure ulcers Date Initiated: 09/12/2014 Goal Status: Active Patient/caregiver will verbalize risk factors for pressure ulcer development Date Initiated: 09/12/2014 Goal Status: Active Patient/caregiver will verbalize understanding of pressure ulcer management Date Initiated: 09/12/2014 Goal  Status: Active Interventions: Assess: immobility, friction, shearing, incontinence upon admission and as needed Assess offloading mechanisms upon admission and as needed Assess potential for pressure ulcer upon admission and as needed Provide education on pressure ulcers Treatment Activities: Pressure reduction/relief device ordered : 04/19/2015 Notes: Wound/Skin Impairment Nursing Diagnoses: Impaired tissue integrity Knowledge deficit related to ulceration/compromised skin integrity Goals: Patient/caregiver will verbalize understanding of skin care regimen Date Initiated: 09/12/2014 Goal Status: Active Ulcer/skin breakdown will heal within 14 weeks Date Initiated: 09/12/2014 Goal Status: Active Interventions: Assess patient/caregiver ability to obtain necessary supplies Assess patient/caregiver ability to perform ulcer/skin care regimen upon admission and as needed Assess ulceration(s) every visit Provide education on ulcer and skin care Treatment Activities: Skin care regimen initiated : 04/19/2015 DANDRIA, PUENTE ([id number]) Topical wound management initiated : 04/19/2015 Notes: Electronic Signature(s) Signed: 04/19/2015 9:54:54 AM By: Regan Lemming BSN, RN Previous Signature: 04/19/2015 9:46:19 AM Version By: Regan Lemming BSN, RN Entered By: Regan Lemming on 04/19/2015 09:54:54 [name] ([id number]) -------------------------------------------------------------------------------- Pain Assessment Details Patient Name: [name] Date of Service: 04/19/2015 9:30 AM Medical Record Number: [id number] Patient Account Number: [id number] Date of Birth/Sex: Sep 12, 1927 (81 y.o. Female) Treating RN: Baruch Gouty, RN, BSN, Velva Harman Primary Care Physician: Constance Goltz Other Clinician: Referring Physician: Constance Goltz Treating Physician/Extender: Frann Rider in Treatment: 31 Active Problems Location of Pain Severity and Description of Pain Patient Has Paino No Site Locations Pain  Management and Medication Current Pain Management: Electronic Signature(s) Signed: 04/19/2015 9:41:21 AM By: Regan Lemming BSN, RN Entered By: Regan Lemming on 04/19/2015 09:41:21 [name] ([id number]) -------------------------------------------------------------------------------- Patient/Caregiver Education Details Patient Name: [name] Date of Service: 04/19/2015 9:30 AM Medical Record Number: [id number] Patient Account Number: [id number] Date of Birth/Gender: [birth year] (81 y.o. Female) Treating RN: Afful, RN, BSN, Velva Harman Primary Care Physician: Constance Goltz Other Clinician: Referring Physician: Constance Goltz Treating Physician/Extender: Frann Rider in Treatment: 66 Education Assessment Education Provided To: Patient Education Topics Provided Basic Hygiene: Methods: Explain/Verbal Responses: State content correctly Pressure: Methods: Explain/Verbal Responses: State content correctly Safety: Methods: Explain/Verbal Responses: State content correctly Welcome To [facility]: Methods: Explain/Verbal Responses: State content correctly Wound Debridement: Methods: Explain/Verbal Responses: State content correctly Wound/Skin Impairment: Methods: Explain/Verbal Responses: State content correctly Electronic Signature(s) Signed: 04/19/2015 10:07:04 AM By: Regan Lemming BSN, RN Entered By: Regan Lemming on 04/19/2015 10:07:04 [name] ([id number]) -------------------------------------------------------------------------------- Wound Assessment Details Patient Name: [name] Date of Service: 04/19/2015 9:30 AM Medical Record Number: [id number] Patient Account Number: [id number] Date of Birth/Sex: June 10, 1928 (81 y.o. Female) Treating RN: Afful, RN, BSN, Velva Harman Primary Care Physician: Constance Goltz Other Clinician: Referring Physician: Constance Goltz Treating Physician/Extender: Frann Rider in Treatment: 31 Wound Status Wound Number: 6 Primary  Arterial Insufficiency Ulcer Etiology: Wound Location: Left Achilles Wound Open Wounding Event: Gradually Appeared Status: Date Acquired: 09/26/2014 Comorbid Glaucoma, Asthma, Hypertension, Weeks Of Treatment: 28 History: Peripheral Venous Disease, Type II Clustered Wound: No Diabetes Wound Measurements Length: (cm) 1 Width: (cm) 1.4 Depth: (cm) 0.1 Area: (cm) 1.1 Volume: (cm) 0.11 % Reduction in Area: 70.8% % Reduction in Volume: 94.2% Epithelialization: Medium (34-66%) Tunneling: No Undermining: No Wound Description Classification: Partial Thickness Foul O Diabetic Severity (Wagner): Grade 1 Wound Margin: Distinct, outline attached  Exudate Amount: Small Exudate Type: Serous Exudate Color: amber dor After Cleansing: No Wound Bed Granulation Amount: Medium (34-66%) Exposed Structure Granulation Quality: Pink, Pale Fascia Exposed: No Necrotic Amount: None Present (0%) Fat Layer Exposed: No Tendon Exposed: No Muscle Exposed: No Joint Exposed: No Bone Exposed: No Limited to Skin Breakdown Periwound Skin Texture Texture Color No Abnormalities Noted: No No Abnormalities Noted: No Callus: No Atrophie Blanche: No Crepitus: No Cyanosis: No MELENE, SCHRAUBEN ([id number]) Excoriation: No Ecchymosis: No Fluctuance: No Erythema: No Friable: No Hemosiderin Staining: Yes Induration: No Mottled: No Localized Edema: Yes Pallor: No Rash: No Rubor: No Scarring: No Temperature / Pain Moisture Temperature: No Abnormality No Abnormalities Noted: No Dry / Scaly: No Maceration: No Moist: Yes Wound Preparation Ulcer Cleansing: Rinsed/Irrigated with Saline Topical Anesthetic Applied: Other: lidocaine 4%, Treatment Notes Wound #6 (Left Achilles) 1. Cleansed with: Clean wound with Normal Saline 3. Peri-wound Care: Skin Prep 4. Dressing Applied: Prisma Ag 5. Secondary Dressing Applied Bordered Foam Dressing Electronic Signature(s) Signed: 04/19/2015 9:46:06 AM By:  Regan Lemming BSN, RN Entered By: Regan Lemming on 04/19/2015 09:46:06 [name] ([id number]) -------------------------------------------------------------------------------- Vitals Details Patient Name: [name] Date of Service: 04/19/2015 9:30 AM Medical Record Number: [id number] Patient Account Number: [id number] Date of Birth/Sex: Aug 10, 1928 (81 y.o. Female) Treating RN: Afful, RN, BSN, Velva Harman Primary Care Physician: Constance Goltz Other Clinician: Referring Physician: Constance Goltz Treating Physician/Extender: Frann Rider in Treatment: 31 Vital Signs Time Taken: 09:41 Temperature (F): 98.1 Height (in): 64 Pulse (bpm): 66 Weight (lbs): 180 Respiratory Rate (breaths/min): 16 Body Mass Index (BMI): 30.9 Blood Pressure (mmHg): 133/70 Reference Range: 80 - 120 mg / dl Electronic Signature(s) Signed: 04/19/2015 9:41:49 AM By: Regan Lemming BSN, RN Entered By: Regan Lemming on 04/19/2015 09:41:49

## 2015-04-20 NOTE — Progress Notes (Signed)
[name], [name] ([id number]) Visit Report for 04/19/2015 Chief Complaint Document Details Patient Name: [name], [name] Date of Service: 04/19/2015 9:30 AM Medical Record Number: [id number] Patient Account Number: [id number] Date of Birth/Sex: [birth year] (81 y.o. Female) Treating RN: Afful, RN, BSN, Velva Harman Primary Care Physician: Constance Goltz Other Clinician: Referring Physician: Constance Goltz Treating Physician/Extender: Frann Rider in Treatment: 31 Information Obtained from: Patient Chief Complaint Patient presents to the wound care center today with an open arterial ulcer on the left foot big toe and lateral heal. She also has some superficial ulcerations on the second and third toe dorsum. she has no family member with her today and she is a very poor historian and cannot give a proper history. As stated that she walks around with a walker. Electronic Signature(s) Signed: 04/19/2015 10:08:50 AM By: Christin Fudge MD, FACS Entered By: Christin Fudge on 04/19/2015 10:08:50 [name] ([id number]) -------------------------------------------------------------------------------- HPI Details Patient Name: [name] Date of Service: 04/19/2015 9:30 AM Medical Record Number: [id number] Patient Account Number: [id number] Date of Birth/Sex: 16-Apr-1928 (81 y.o. Female) Treating RN: Baruch Gouty, RN, BSN, Velva Harman Primary Care Physician: Constance Goltz Other Clinician: Referring Physician: Constance Goltz Treating Physician/Extender: Frann Rider in Treatment: 31 History of Present Illness Location: left heel Quality: Patient reports experiencing a dull pain to affected area(s). Severity: Patient states wound are getting worse. Duration: Patient states that they are not certain how long the wound has been present. Timing: Pain in wound is Intermittent (comes and goes Context: The wound appeared gradually over time Associated Signs and Symptoms: Patient reports having difficulty standing  for long periods. HPI Description: This 81 year old patient is a poor historian and comes from a nursing home. Does not have any family members with her. Says she has a ulcer on the lateral part of her left foot and some new ones are there on her right foot too. She is unable to say how long she's had these. she does walk with a walker and this is limited most of the day. Does not recall if she has had any vascular studies done. I have reviewed an x-ray done of the left foot which basically shows osteoporosis but no evidence of fracture dislocation or osteomyelitis. 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 She seems to be doing better overall. 10/19/14 -- I understand she has gone to the vascular surgery and lab yesterday and has had all her tests done yesterday. She is doing fine otherwise and has had no fresh issues. 10/26/2014 she seems to be doing well and has no fresh problems and seems much brighter. 10/12/14 -- Taking her antibiotic and continues to do her dressings locally and she is helped by her skilled nursing. She seems to be doing fine and has no fresh issues. 10/19/14 -- she is doing fine and has had no fresh issues. She says her son to go for her vascular workup yesterday and she has been called back after 3 months. we will try and gather these reports to review what they said. 10/26/14 --I have been able to review notes from the vascular surgery office and these were dated from 10/18/2014. He patient was on her first postop visit and had Doppler ultrasounds done of her arteries. She had more than 50% stenosis of the right superficial femoral artery and more than 50% stenosis in the  left tibioperoneal trunk. She was status post a left lower extremity angiogram with angioplasty of the peroneal and left  superficial femoral arteries for a nonhealing left foot and ankle ulceration. note is made of the fact that the ABIs had not changed in the last month since her previous visit. The opinion of her provider was that she did not need any intervention at this time and they had done everything they could at the present time. They would continue on Plavix and antibiotics and see her back in 3 months for a another arterial duplex study of the lower extremities. [name], [name] ([id number]) 11/02/2014 -- she has spoken to her caregivers about coming for HBO 5 times per week for 6-8 weeks and they are agreeable about this and she is willing to undergo hyperbaric oxygen therapy. 11/16/2014 she has been worked up with a EKG and chest x-ray and these were within normal limits. As far as the investigations go she is ready for HBOT. we are awaiting some insurance clearance so that she can start on her hyperbaric oxygen therapy. 11/30/2014 she tolerated hyperbaric oxygen therapy fine and there were no issues with her blood glucose levels. As noted she is not a diabetic and most of her fingerstick blood glucose levels are inaccurate because of the Raynaud's phenomena. She normally has to have your lobe sticks to get any random glucose levels. I have also requested her primary care does a venous blood glucose draw to check her glucose level. 12/21/2014 -- she is tolerating hyperbaric oxygen very well and has overall made a lot of progress. Her left leg is looking much better. 01/11/2015 - No new complaints today. New ulceration on the dorsum of the right second toe noted today on physical exam. Patient unaware. No significant pain. No fever or chills. No significant drainage. Offloading with sage boots at night. Significantly improved with HBO. 01/19/2015 - the patient has had a x-ray of the right foot done at the nursing home but we still do not have the report and we are awaiting this result. She is otherwise  doing fine with her general health. 01/25/2015 -- X-ray of her right foot showed no acute fracture, dislocation, bony infection or osteomyelitis. The was some soft tissue swelling and osteopenia seen. 02/15/2015 -- due to some insurance issues we could not get her epifix and we are working with her company to get her this. She is doing fine otherwise and has no fresh issues. 04/04/2015 -- she had a follow-up with the vascular surgeon and he seemed to be pleased with her progress and has had no problems with her vascularity. Electronic Signature(s) Signed: 04/19/2015 10:08:59 AM By: Christin Fudge MD, FACS Entered By: Christin Fudge on 04/19/2015 10:08:59 [name] ([id number]) -------------------------------------------------------------------------------- Physical Exam Details Patient Name: [name] Date of Service: 04/19/2015 9:30 AM Medical Record Number: [id number] Patient Account Number: [id number] Date of Birth/Sex: 22-Feb-1928 (81 y.o. Female) Treating RN: Afful, RN, BSN, Velva Harman Primary Care Physician: Constance Goltz Other Clinician: Referring Physician: Constance Goltz Treating Physician/Extender: Frann Rider in Treatment: 31 Constitutional . Pulse regular. Respirations normal and unlabored. Afebrile. . Eyes Nonicteric. Reactive to light. Ears, Nose, Mouth, and Throat Lips, teeth, and gums WNL.Marland Kitchen Moist mucosa without lesions . Neck supple and nontender. No palpable supraclavicular or cervical adenopathy. Normal sized without goiter. Respiratory WNL. No retractions.. Cardiovascular Pedal Pulses WNL. No clubbing, cyanosis or edema. Lymphatic No adneopathy. No adenopathy. No adenopathy. Musculoskeletal Adexa without tenderness or enlargement.. Digits and nails w/o clubbing, cyanosis,  infection, petechiae, ischemia, or inflammatory conditions.. Integumentary (Hair, Skin) No suspicious lesions. No crepitus or fluctuance. No peri-wound warmth or erythema. No  masses.Marland Kitchen Psychiatric Judgement and insight Intact.. No evidence of depression, anxiety, or agitation.. Notes The wound on the left Achilles tendon posteriorly has got healthy granulation tissue but has stalled for several weeks now and I would like to put her on a trial with Puraply applications. Electronic Signature(s) Signed: 04/19/2015 10:10:02 AM By: Christin Fudge MD, FACS Entered By: Christin Fudge on 04/19/2015 10:10:02 [name] ([id number]) -------------------------------------------------------------------------------- Physician Orders Details Patient Name: [name] Date of Service: 04/19/2015 9:30 AM Medical Record Number: [id number] Patient Account Number: [id number] Date of Birth/Sex: [birth year] (81 y.o. Female) Treating RN: Afful, RN, BSN, Velva Harman Primary Care Physician: Constance Goltz Other Clinician: Referring Physician: Constance Goltz Treating Physician/Extender: Frann Rider in Treatment: 67 Verbal / Phone Orders: Yes Clinician: Afful, RN, BSN, Rita Read Back and Verified: Yes Diagnosis Coding Wound Cleansing Wound #6 Left Achilles o Clean wound with Normal Saline. - be sure to clean out all remaining prisma Anesthetic Wound #6 Left Achilles o Topical Lidocaine 4% cream applied to wound bed prior to debridement Primary Wound Dressing Wound #6 Left Achilles o Prisma Ag - or collagen with silver equivalent - triple layer please Secondary Dressing Wound #6 Left Achilles o Boardered Foam Dressing Dressing Change Frequency Wound #6 Left Achilles o Change dressing every other day. Follow-up Appointments Wound #6 Left Achilles o Return Appointment in 1 week. Off-Loading Wound #6 Left Achilles o Other: - SAGE boots while sitting or lying. Do not wear while ambulating. Home Health Wound #6 Left Achilles o Continue Home Health Visits - [place] Nurse may visit PRN to address patientos wound care needs. o FACE TO FACE  ENCOUNTER: MEDICARE and MEDICAID PATIENTS: I certify that this patient is under my care and that I had a face-to-face encounter that meets the physician face-to-face encounter requirements with this patient on this date. The encounter with the patient was in [place], [place] ([id number]) whole or in part for the following MEDICAL CONDITION: (primary reason for [place]) MEDICAL NECESSITY: I certify, that based on my findings, NURSING services are a medically necessary home health service. HOME BOUND STATUS: I certify that my clinical findings support that this patient is homebound (i.e., Due to illness or injury, pt requires aid of supportive devices such as crutches, cane, wheelchairs, walkers, the use of special transportation or the assistance of another person to leave their place of residence. There is a normal inability to leave the home and doing so requires considerable and taxing effort. Other absences are for medical reasons / religious services and are infrequent or of short duration when for other reasons). o If current dressing causes regression in wound condition, may D/C ordered dressing product/s and apply Normal Saline Moist Dressing daily until next [place] / Other MD appointment. [place] of regression in wound condition at [phone]. o Please direct any NON-WOUND related issues/requests for orders to patient's Primary Care Physician Electronic Signature(s) Signed: 04/19/2015 10:04:59 AM By: Regan Lemming BSN, RN Signed: 04/19/2015 4:57:18 PM By: Christin Fudge MD, FACS Entered By: Regan Lemming on 04/19/2015 10:04:59 [name] ([id number]) -------------------------------------------------------------------------------- Problem List Details Patient Name: [name] Date of Service: 04/19/2015 9:30 AM Medical Record Number: [id number] Patient Account Number: [id number] Date of Birth/Sex: 11-11-1927 (81 y.o. 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 09/12/2014 Yes of heel and midfoot I70.235 Atherosclerosis of native arteries of right leg with 09/12/2014 Yes ulceration of other part of foot Inactive Problems Resolved Problems ICD-10 Code Description Active Date Resolved Date L97.412 Non-pressure chronic ulcer of right heel and midfoot with 10/26/2014 10/26/2014 fat layer exposed S91.104A Unspecified open wound of right lesser toe(s) without 01/11/2015 01/11/2015 damage to nail, initial encounter Electronic Signature(s) Signed: 04/19/2015 10:08:40 AM By: Christin Fudge MD, FACS Entered By: Christin Fudge on 04/19/2015 10:08:40 [name] ([id number]) -------------------------------------------------------------------------------- Progress Note Details Patient Name: [name] Date of Service: 04/19/2015 9:30 AM Medical Record Number: [id number] Patient Account Number: [id number] Date of Birth/Sex: 12/18/1927 (81 y.o. Female) Treating RN: Afful, RN, BSN, Velva Harman Primary Care Physician: Constance Goltz Other Clinician: Referring Physician: Constance Goltz Treating Physician/Extender: Frann Rider in Treatment: 31 Subjective Chief Complaint Information obtained from Patient Patient presents to the wound care center today with an open arterial ulcer on the left foot big toe and lateral heal. She also has some superficial ulcerations on the second and third toe dorsum. she has no family member with her today and she is a very poor historian and cannot give a proper history. As stated that she walks around with a  walker. History of Present Illness (HPI) The following HPI elements were documented for the patient's wound: Location: left heel Quality: Patient reports experiencing a dull pain to affected area(s). Severity: Patient states wound are getting worse. Duration: Patient states that they are not certain how long the wound has been present. Timing: Pain in wound is Intermittent (comes and goes Context: The wound appeared gradually over time Associated Signs and Symptoms: Patient reports having difficulty standing for long periods. This 81 year old patient is a poor historian and comes from a nursing home. Does not have any family members with her. Says she has a ulcer on the lateral part of her left foot and some new ones are there on her right foot too. She is unable to say how long she's had these. she does walk with a walker and this is limited most of the day. Does not recall if she has had any vascular studies done. I have reviewed an x-ray done of the left foot which basically shows osteoporosis but no evidence of fracture dislocation or osteomyelitis. 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 She seems to be doing better overall. 10/19/14 -- I understand she has gone to the vascular surgery and lab yesterday and has had all her tests done yesterday. She is doing fine otherwise and has had no fresh issues. 10/26/2014 she seems to be doing well and has no fresh problems and seems much brighter. 10/12/14 -- Taking her antibiotic and continues to do her dressings locally and she is helped by her skilled nursing. She seems to be doing fine and has no fresh issues. 10/19/14 -- she is doing fine and has had no fresh issues. She says her son to go for her vascular workup yesterday and she has been called back after 3 months. we will  try and gather these reports to  review what NINAMARIE, [name] ([id number]) they said. 10/26/14 --I have been able to review notes from the vascular surgery office and these were dated from 10/18/2014. He patient was on her first postop visit and had Doppler ultrasounds done of her arteries. She had more than 50% stenosis of the right superficial femoral artery and more than 50% stenosis in the left tibioperoneal trunk. She was status post a left lower extremity angiogram with angioplasty of the peroneal and left superficial femoral arteries for a nonhealing left foot and ankle ulceration. note is made of the fact that the ABIs had not changed in the last month since her previous visit. The opinion of her provider was that she did not need any intervention at this time and they had done everything they could at the present time. They would continue on Plavix and antibiotics and see her back in 3 months for a another arterial duplex study of the lower extremities. 11/02/2014 -- she has spoken to her caregivers about coming for HBO 5 times per week for 6-8 weeks and they are agreeable about this and she is willing to undergo hyperbaric oxygen therapy. 11/16/2014 she has been worked up with a EKG and chest x-ray and these were within normal limits. As far as the investigations go she is ready for HBOT. we are awaiting some insurance clearance so that she can start on her hyperbaric oxygen therapy. 11/30/2014 she tolerated hyperbaric oxygen therapy fine and there were no issues with her blood glucose levels. As noted she is not a diabetic and most of her fingerstick blood glucose levels are inaccurate because of the Raynaud's phenomena. She normally has to have your lobe sticks to get any random glucose levels. I have also requested her primary care does a venous blood glucose draw to check her glucose level. 12/21/2014 -- she is tolerating hyperbaric oxygen very well and has overall made a lot of  progress. Her left leg is looking much better. 01/11/2015 - No new complaints today. New ulceration on the dorsum of the right second toe noted today on physical exam. Patient unaware. No significant pain. No fever or chills. No significant drainage. Offloading with sage boots at night. Significantly improved with HBO. 01/19/2015 - the patient has had a x-ray of the right foot done at the nursing home but we still do not have the report and we are awaiting this result. She is otherwise doing fine with her general health. 01/25/2015 -- X-ray of her right foot showed no acute fracture, dislocation, bony infection or osteomyelitis. The was some soft tissue swelling and osteopenia seen. 02/15/2015 -- due to some insurance issues we could not get her epifix and we are working with her company to get her this. She is doing fine otherwise and has no fresh issues. 04/04/2015 -- she had a follow-up with the vascular surgeon and he seemed to be pleased with her progress and has had no problems with her vascularity. [name], [name] ([id number]) Constitutional Pulse regular. Respirations normal and unlabored. Afebrile. Vitals Time Taken: 9:41 AM, Height: 64 in, Weight: 180 lbs, BMI: 30.9, Temperature: 98.1 F, Pulse: 66 bpm, Respiratory Rate: 16 breaths/min, Blood Pressure: 133/70 mmHg. Eyes Nonicteric. Reactive to light. Ears, Nose, Mouth, and Throat Lips, teeth, and gums WNL.Marland Kitchen Moist mucosa without lesions . Neck supple and nontender. No palpable supraclavicular or cervical adenopathy. Normal sized without goiter. Respiratory WNL. No retractions.. Cardiovascular Pedal Pulses WNL. No clubbing, cyanosis or edema. Lymphatic No adneopathy. No adenopathy. No adenopathy. Musculoskeletal  Adexa without tenderness or enlargement.. Digits and nails w/o clubbing, cyanosis, infection, petechiae, ischemia, or inflammatory conditions.Marland Kitchen Psychiatric Judgement and insight Intact.. No evidence of  depression, anxiety, or agitation.. General Notes: The wound on the left Achilles tendon posteriorly has got healthy granulation tissue but has stalled for several weeks now and I would like to put her on a trial with Puraply applications. Integumentary (Hair, Skin) No suspicious lesions. No crepitus or fluctuance. No peri-wound warmth or erythema. No masses.. Wound #6 status is Open. Original cause of wound was Gradually Appeared. The wound is located on the Left Achilles. The wound measures 1cm length x 1.4cm width x 0.1cm depth; 1.1cm^2 area and 0.11cm^3 volume. The wound is limited to skin breakdown. There is no tunneling or undermining noted. There is a small amount of serous drainage noted. The wound margin is distinct with the outline attached to the wound base. There is medium (34-66%) pink, pale granulation within the wound bed. There is no necrotic tissue within the wound bed. The periwound skin appearance exhibited: Localized Edema, Moist, Hemosiderin Staining. The periwound skin appearance did not exhibit: Callus, Crepitus, Excoriation, Fluctuance, Friable, Induration, Rash, Scarring, Dry/Scaly, Maceration, Atrophie Blanche, Cyanosis, Ecchymosis, Mottled, Pallor, Rubor, Erythema. Periwound temperature was noted as No Abnormality. [name], [name] ([id number]) Assessment Active Problems ICD-10 [phone] - Non-pressure chronic ulcer of left heel and midfoot with fat layer exposed I70.244 - Atherosclerosis of native arteries of left leg with ulceration of heel and midfoot I70.235 - Atherosclerosis of native arteries of right leg with ulceration of other part of foot After sharply debriding the wound and trying to remove as much of biofilm as possible I have recommended continuing with silver collagen and a bordered foam. The wound on the left Achilles tendon posteriorly has got healthy granulation tissue but has stalled for several weeks now and I would like to put her on a trial with  Puraply applications. The patient is agreeable about this and due to the fact that we could not get her insurance to cover skin substitute it'll be a good idea to try and get her onto the trial of Puraply we will try and work with the representative. Plan Wound Cleansing: Wound #6 Left Achilles: Clean wound with Normal Saline. - be sure to clean out all remaining prisma Anesthetic: Wound #6 Left Achilles: Topical Lidocaine 4% cream applied to wound bed prior to debridement Primary Wound Dressing: Wound #6 Left Achilles: Prisma Ag - or collagen with silver equivalent - triple layer please Secondary Dressing: Wound #6 Left Achilles: Boardered Foam Dressing Dressing Change Frequency: Wound #6 Left Achilles: Change dressing every other day. Follow-up Appointments: Wound #6 Left Achilles: Return Appointment in 1 week. Off-Loading: Wound #6 Left Achilles: Other: - SAGE boots while sitting or lying. Do not wear while ambulating. Home Health: Wound #6 Left Achilles: [name], [name] ([id number]) [place] Visits - [facility] Nurse may visit PRN to address patient s wound care needs. FACE TO FACE ENCOUNTER: MEDICARE and MEDICAID PATIENTS: I certify that this patient is under my care and that I had a face-to-face encounter that meets the physician face-to-face encounter requirements with this patient on this date. The encounter with the patient was in whole or in part for the following MEDICAL CONDITION: (primary reason for [place]) MEDICAL NECESSITY: I certify, that based on my findings, NURSING services are a medically necessary home health service. HOME BOUND STATUS: I certify that my clinical findings support that this patient is homebound (i.e., Due  to illness or injury, pt requires aid of supportive devices such as crutches, cane, wheelchairs, walkers, the use of special transportation or the assistance of another person to leave their place of residence.  There is a normal inability to leave the home and doing so requires considerable and taxing effort. Other absences are for medical reasons / religious services and are infrequent or of short duration when for other reasons). If current dressing causes regression in wound condition, may D/C ordered dressing product/s and apply Normal Saline Moist Dressing daily until next [place] / Other MD appointment. [place] of regression in wound condition at [phone]. Please direct any NON-WOUND related issues/requests for orders to patient's Primary Care Physician After sharply debriding the wound and trying to remove as much of biofilm as possible I have recommended continuing with silver collagen and a bordered foam. The wound on the left Achilles tendon posteriorly has got healthy granulation tissue but has stalled for several weeks now and I would like to put her on a trial with Puraply applications. The patient is agreeable about this and due to the fact that we could not get her insurance to cover skin substitute it'll be a good idea to try and get her onto the trial of [employer] we will try and work with the representative. Electronic Signature(s) Signed: 04/19/2015 10:14:32 AM By: Christin Fudge MD, FACS Entered By: Christin Fudge on 04/19/2015 10:14:32 [name] ([id number]) -------------------------------------------------------------------------------- SuperBill Details Patient Name: [name] Date of Service: 04/19/2015 Medical Record Number: [id number] Patient Account Number: [id number] Date of Birth/Sex: [birth year] (81 y.o. 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 I70.235 Atherosclerosis of native arteries of right leg with ulceration of other part of foot Facility Procedures CPT4 Code: [id number] Description: [id number] - WOUND CARE VISIT-LEV 2 EST PT Modifier: Quantity: 1 Physician Procedures CPT4: Description Modifier Quantity Code [id number] - WC PHYS LEVEL 3 - EST PT 1 ICD-10 Description Diagnosis 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 of native arteries of right leg with ulceration of other part of foot Electronic Signature(s) Signed: 04/19/2015 10:14:44 AM By: Christin Fudge MD, FACS Entered By: Christin Fudge on 04/19/2015 10:14:44

## 2015-04-26 ENCOUNTER — Encounter: Payer: Medicare Other | Admitting: Surgery

## 2015-04-26 DIAGNOSIS — L97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: Secondary | ICD-10-CM | POA: Diagnosis not present

## 2015-04-26 NOTE — Progress Notes (Signed)
[name], [name] ([id number]) Visit Report for 04/26/2015 Arrival Information Details Patient Name: [name], [name] Date of Service: 04/26/2015 8:00 AM Medical Record Number: [id number] Patient Account Number: [id number] Date of Birth/Sex: [birth year] (81 y.o. Female) Treating RN: Afful, RN, BSN, Velva Harman Primary Care Physician: Constance Goltz Other Clinician: Referring Physician: Constance Goltz Treating Physician/Extender: Frann Rider in Treatment: 27 Visit Information History Since Last Visit Any new allergies or adverse reactions: No Patient Arrived: Wheel Chair Had a fall or experienced change in No Arrival Time: 08:09 activities of daily living that may affect Accompanied By: driver in lobby risk of falls: Transfer Assistance: None Signs or symptoms of abuse/neglect since last No Patient Identification Verified: Yes visito Secondary Verification Process Yes Hospitalized since last visit: No Completed: Has Dressing in Place as Prescribed: Yes Patient Requires Transmission- No Pain Present Now: No Based Precautions: Patient Has Alerts: Yes Patient Alerts: Patient on Blood Thinner Electronic Signature(s) Signed: 04/26/2015 8:12:07 AM By: Regan Lemming BSN, RN Entered By: Regan Lemming on 04/26/2015 08:12:07 [name] ([id number]) -------------------------------------------------------------------------------- Clinic Level of Care Assessment Details Patient Name: [name] Date of Service: 04/26/2015 8:00 AM Medical Record Number: [id number] Patient Account Number: [id number] Date of Birth/Sex: 09-23-1927 (81 y.o. Female) Treating RN: Afful, RN, BSN, Velva Harman Primary Care Physician: Constance Goltz Other Clinician: Referring Physician: Constance Goltz Treating Physician/Extender: Frann Rider in Treatment: 32 Clinic Level of Care Assessment Items TOOL 4 Quantity Score []  - Use when only an EandM is performed on FOLLOW-UP visit 0 ASSESSMENTS - Nursing Assessment /  Reassessment X - Reassessment of Co-morbidities (includes updates in patient status) 1 10 X - Reassessment of Adherence to Treatment Plan 1 5 ASSESSMENTS - Wound and Skin Assessment / Reassessment X - Simple Wound Assessment / Reassessment - one wound 1 5 []  - Complex Wound Assessment / Reassessment - multiple wounds 0 []  - Dermatologic / Skin Assessment (not related to wound area) 0 ASSESSMENTS - Focused Assessment []  - Circumferential Edema Measurements - multi extremities 0 []  - Nutritional Assessment / Counseling / Intervention 0 X - Lower Extremity Assessment (monofilament, tuning fork, pulses) 1 5 []  - Peripheral Arterial Disease Assessment (using hand held doppler) 0 ASSESSMENTS - Ostomy and/or Continence Assessment and Care []  - Incontinence Assessment and Management 0 []  - Ostomy Care Assessment and Management (repouching, etc.) 0 PROCESS - Coordination of Care X - Simple Patient / Family Education for ongoing care 1 15 []  - Complex (extensive) Patient / Family Education for ongoing care 0 []  - Staff obtains Programmer, systems, Records, Test Results / Process Orders 0 []  - Staff telephones HHA, Nursing Homes / Clarify orders / etc 0 []  - Routine Transfer to another Facility (non-emergent condition) 0 [place], [name] ([id number]) []  - Routine Hospital Admission (non-emergent condition) 0 []  - New Admissions / Biomedical engineer / Ordering NPWT, Apligraf, etc. 0 []  - Emergency Hospital Admission (emergent condition) 0 []  - Simple Discharge Coordination 0 []  - Complex (extensive) Discharge Coordination 0 PROCESS - Special Needs []  - Pediatric / Minor Patient Management 0 []  - Isolation Patient Management 0 []  - Hearing / Language / Visual special needs 0 []  - Assessment of Community assistance (transportation, D/C planning, etc.) 0 []  - Additional assistance / Altered mentation 0 []  - Support Surface(s) Assessment (bed, cushion, seat, etc.) 0 INTERVENTIONS - Wound Cleansing /  Measurement X - Simple Wound Cleansing - one wound 1 5 []  - Complex Wound Cleansing - multiple wounds 0 X - Wound Imaging (photographs - any number of wounds)  1 5 []  - Wound Tracing (instead of photographs) 0 X - Simple Wound Measurement - one wound 1 5 []  - Complex Wound Measurement - multiple wounds 0 INTERVENTIONS - Wound Dressings X - Small Wound Dressing one or multiple wounds 1 10 []  - Medium Wound Dressing one or multiple wounds 0 []  - Large Wound Dressing one or multiple wounds 0 []  - Application of Medications - topical 0 []  - Application of Medications - injection 0 INTERVENTIONS - Miscellaneous []  - External ear exam 0 [name], [name] ([id number]) []  - Specimen Collection (cultures, biopsies, blood, body fluids, etc.) 0 []  - Specimen(s) / Culture(s) sent or taken to Lab for analysis 0 []  - Patient Transfer (multiple staff / Harrel Lemon Lift / Similar devices) 0 []  - Simple Staple / Suture removal (25 or less) 0 []  - Complex Staple / Suture removal (26 or more) 0 []  - Hypo / Hyperglycemic Management (close monitor of Blood Glucose) 0 []  - Ankle / Brachial Index (ABI) - do not check if billed separately 0 X - Vital Signs 1 5 Has the patient been seen at the hospital within the last three years: Yes Total Score: 70 Level Of Care: New/Established - Level 2 Electronic Signature(s) Signed: 04/26/2015 8:32:13 AM By: Regan Lemming BSN, RN Entered By: Regan Lemming on 04/26/2015 08:32:13 [name] ([id number]) -------------------------------------------------------------------------------- Encounter Discharge Information Details Patient Name: [name] Date of Service: 04/26/2015 8:00 AM Medical Record Number: [id number] Patient Account Number: [id number] Date of Birth/Sex: January 17, 1928 (81 y.o. Female) Treating RN: Afful, RN, BSN, Velva Harman Primary Care Physician: Constance Goltz Other Clinician: Referring Physician: Constance Goltz Treating Physician/Extender: Frann Rider in  Treatment: 25 Encounter Discharge Information Items Discharge Pain Level: 0 Discharge Condition: Stable Ambulatory Status: Wheelchair Discharge Destination: Nursing Home Transportation: Other Accompanied By: driver in lobby Schedule Follow-up Appointment: No Medication Reconciliation completed No and provided to Patient/Care Makayela Secrest: Provided on Clinical Summary of Care: 04/26/2015 Form Type Recipient Paper Patient DJ Electronic Signature(s) Signed: 04/26/2015 8:33:21 AM By: Regan Lemming BSN, RN Previous Signature: 04/26/2015 8:29:45 AM Version By: Ruthine Dose Entered By: Regan Lemming on 04/26/2015 08:33:21 [name] ([id number]) -------------------------------------------------------------------------------- Lower Extremity Assessment Details Patient Name: [name] Date of Service: 04/26/2015 8:00 AM Medical Record Number: [id number] Patient Account Number: [id number] Date of Birth/Sex: 09-25-1927 (81 y.o. Female) Treating RN: Afful, RN, BSN, Velva Harman Primary Care Physician: Constance Goltz Other Clinician: Referring Physician: Constance Goltz Treating Physician/Extender: Frann Rider in Treatment: 32 Vascular Assessment Pulses: Posterior Tibial Dorsalis Pedis Palpable: [Left:No] Doppler: [Left:Monophasic] Extremity colors, hair growth, and conditions: Extremity Color: [Left:Normal] Hair Growth on Extremity: [Left:No] Temperature of Extremity: [Left:Warm] Capillary Refill: [Left:< 3 seconds] Toe Nail Assessment Left: Right: Thick: Yes Discolored: No Deformed: No Improper Length and Hygiene: No Electronic Signature(s) Signed: 04/26/2015 8:14:44 AM By: Regan Lemming BSN, RN Entered By: Regan Lemming on 04/26/2015 08:14:44 [name] ([id number]) -------------------------------------------------------------------------------- Multi Wound Chart Details Patient Name: [name] Date of Service: 04/26/2015 8:00 AM Medical Record Number: [id number] Patient  Account Number: [id number] Date of Birth/Sex: 06/06/1928 (81 y.o. 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 N/A] Comorbid History: [6:Glaucoma, Asthma, Hypertension, Peripheral Venous Disease, Type II  Diabetes] [N/A:N/A] Date Acquired: [6:09/26/2014] [N/A:N/A] Weeks of Treatment: [6:29] [N/A:N/A] Wound Status: [6:Open] [N/A:N/A] Measurements L x W x D 1x1x0.1 [N/A:N/A] (cm) Area (cm) : [6:0.785] [N/A:N/A] Volume (cm) : [6:0.079] [N/A:N/A] % Reduction in Area: [6:79.20%] [N/A:N/A] % Reduction in Volume: 95.80% [N/A:N/A] Classification: [6:Partial Thickness] [N/A:N/A] HBO Classification: [6:Grade 1] [N/A:N/A] Exudate Amount: [6:Small] [N/A:N/A] Exudate Type: [6:Serous] [N/A:N/A] Exudate Color: [6:amber] [N/A:N/A] Wound Margin: [6:Distinct, outline attached N/A] Granulation Amount: [6:Medium (34-66%)] [N/A:N/A] Granulation Quality: [6:Pink, Pale] [N/A:N/A] Necrotic Amount: [6:None Present (0%)] [N/A:N/A] Exposed Structures: [6:Fascia: No Fat: No Tendon: No Muscle: No] [N/A:N/A] Joint: No Bone: No Limited to Skin Breakdown Epithelialization: Medium (34-66%) N/A N/A Periwound Skin Texture: Edema: Yes N/A N/A Excoriation: No Induration: No Callus: No Crepitus: No Fluctuance: No Friable: No Rash: No Scarring: No Periwound Skin Moist: Yes N/A N/A Moisture: Maceration: No Dry/Scaly: No Periwound Skin Color: Hemosiderin Staining: Yes N/A N/A Atrophie Blanche:  No Cyanosis: No Ecchymosis: No Erythema: No Mottled: No Pallor: No Rubor: No Temperature: No Abnormality N/A N/A Tenderness on No N/A N/A Palpation: Wound Preparation: Ulcer Cleansing: N/A N/A Rinsed/Irrigated with Saline Topical Anesthetic Applied: Other: lidocaine 4% Treatment Notes Electronic Signature(s) Signed: 04/26/2015 8:30:47 AM By: Regan Lemming BSN, RN Entered By: Regan Lemming on 04/26/2015 08:30:47 [name] ([id number]) -------------------------------------------------------------------------------- [place] Details Patient Name: [name] Date of Service: 04/26/2015 8:00 AM Medical Record Number: [id number] Patient Account Number: [id number] Date of Birth/Sex: [birth year] (81 y.o. Female) Treating RN: Afful, RN, BSN, Velva Harman Primary Care Physician: Constance Goltz Other Clinician: Referring Physician: Constance Goltz Treating Physician/Extender: Frann Rider in Treatment: 34 Active Inactive Abuse / Safety / Falls / Self Care Management Nursing Diagnoses: Impaired physical mobility Potential for falls Goals: Patient will remain injury free Date Initiated: 09/12/2014 Goal Status: Active Patient/caregiver will verbalize understanding of skin care regimen Date Initiated: 09/12/2014 Goal Status: Active Patient/caregiver will verbalize/demonstrate measures taken to prevent injury and/or falls Date Initiated: 09/12/2014 Goal Status: Active Patient/caregiver will verbalize/demonstrate understanding of what to do in case of emergency Date Initiated: 09/12/2014 Goal Status: Active Interventions: Assess fall risk on admission and as needed Assess: immobility, friction, shearing, incontinence upon admission and as needed Assess impairment of mobility on admission and as needed per policy Provide education on basic hygiene Provide education on fall prevention Provide education on personal and home safety Provide education on safe  transfers Treatment Activities: Education provided on Basic Hygiene : 10/12/2014 Notes: Necrotic Tissue [name], [name] ([id number]) Nursing Diagnoses: Impaired tissue integrity related to necrotic/devitalized tissue Knowledge deficit related to management of necrotic/devitalized tissue Goals: Necrotic/devitalized tissue will be minimized in the wound bed Date Initiated: 09/12/2014 Goal Status: Active Patient/caregiver will verbalize understanding of reason and process for debridement of necrotic tissue Date Initiated: 09/12/2014 Goal Status: Active Interventions: Assess patient pain level pre-, during and post procedure and prior to discharge Provide education on necrotic tissue and debridement process Treatment Activities: Apply topical anesthetic as ordered : 04/26/2015 Enzymatic debridement : 04/26/2015 Excisional debridement : 04/26/2015 Notes: Orientation to the Wound Care Program Nursing Diagnoses: Knowledge deficit related to the wound healing center program Goals: Patient/caregiver will verbalize understanding of the [place] Program Date Initiated: 09/12/2014 Goal Status: Active Interventions: Provide education on orientation to the wound center Notes: Pressure Nursing Diagnoses: Knowledge deficit related to causes and risk factors for pressure ulcer development Knowledge deficit related to management of pressures ulcers Potential for impaired tissue integrity related to pressure, friction, moisture, and shear GoalsMCKINLIE, [name] ([id number]) Patient will remain  free from development of additional pressure ulcers Date Initiated: 09/12/2014 Goal Status: Active Patient will remain free of pressure ulcers Date Initiated: 09/12/2014 Goal Status: Active Patient/caregiver will verbalize risk factors for pressure ulcer development Date Initiated: 09/12/2014 Goal Status: Active Patient/caregiver will verbalize understanding of pressure ulcer management Date  Initiated: 09/12/2014 Goal Status: Active Interventions: Assess: immobility, friction, shearing, incontinence upon admission and as needed Assess offloading mechanisms upon admission and as needed Assess potential for pressure ulcer upon admission and as needed Provide education on pressure ulcers Treatment Activities: Pressure reduction/relief device ordered : 04/26/2015 Notes: Wound/Skin Impairment Nursing Diagnoses: Impaired tissue integrity Knowledge deficit related to ulceration/compromised skin integrity Goals: Patient/caregiver will verbalize understanding of skin care regimen Date Initiated: 09/12/2014 Goal Status: Active Ulcer/skin breakdown will heal within 14 weeks Date Initiated: 09/12/2014 Goal Status: Active Interventions: Assess patient/caregiver ability to obtain necessary supplies Assess patient/caregiver ability to perform ulcer/skin care regimen upon admission and as needed Assess ulceration(s) every visit Provide education on ulcer and skin care Treatment Activities: Skin care regimen initiated : 04/26/2015 [name], [name] ([id number]) Topical wound management initiated : 04/26/2015 Notes: Electronic Signature(s) Signed: 04/26/2015 8:16:21 AM By: Regan Lemming BSN, RN Entered By: Regan Lemming on 04/26/2015 08:16:21 [name] ([id number]) -------------------------------------------------------------------------------- Pain Assessment Details Patient Name: [name] Date of Service: 04/26/2015 8:00 AM Medical Record Number: [id number] Patient Account Number: [id number] Date of Birth/Sex: 21-Apr-1928 (82 y.o. Female) Treating RN: Baruch Gouty, RN, BSN, Velva Harman Primary Care Physician: Constance Goltz Other Clinician: Referring Physician: Constance Goltz Treating Physician/Extender: Frann Rider in Treatment: 55 Active Problems Location of Pain Severity and Description of Pain Patient Has Paino No Site Locations Pain Management and Medication Current Pain  Management: Electronic Signature(s) Signed: 04/26/2015 8:12:13 AM By: Regan Lemming BSN, RN Entered By: Regan Lemming on 04/26/2015 08:12:13 [name] ([id number]) -------------------------------------------------------------------------------- Patient/Caregiver Education Details Patient Name: [name] Date of Service: 04/26/2015 8:00 AM Medical Record Number: [id number] Patient Account Number: [id number] Date of Birth/Gender: June 26, 1928 (81 y.o. Female) Treating RN: Afful, RN, BSN, Velva Harman Primary Care Physician: Constance Goltz Other Clinician: Referring Physician: Constance Goltz Treating Physician/Extender: Frann Rider in Treatment: 29 Education Assessment Education Provided To: Patient Education Topics Provided Basic Hygiene: Methods: Explain/Verbal Responses: State content correctly Pressure: Methods: Explain/Verbal Responses: State content correctly Safety: Methods: Explain/Verbal Responses: State content correctly Welcome To [facility]: Methods: Explain/Verbal Responses: State content correctly Wound Debridement: Methods: Explain/Verbal Responses: State content correctly Wound/Skin Impairment: Methods: Explain/Verbal Responses: State content correctly Electronic Signature(s) Signed: 04/26/2015 8:33:46 AM By: Regan Lemming BSN, RN Entered By: Regan Lemming on 04/26/2015 08:33:46 [name] ([id number]) -------------------------------------------------------------------------------- Wound Assessment Details Patient Name: [name] Date of Service: 04/26/2015 8:00 AM Medical Record Number: [id number] Patient Account Number: [id number] Date of Birth/Sex: Aug 02, 1928 (81 y.o. Female) Treating RN: Afful, RN, BSN, Velva Harman Primary Care Physician: Constance Goltz Other Clinician: Referring Physician: Constance Goltz Treating Physician/Extender: Frann Rider in Treatment: 32 Wound Status Wound Number: 6 Primary Arterial Insufficiency  Ulcer Etiology: Wound Location: Left Achilles Wound Open Wounding Event: Gradually Appeared Status: Date Acquired: 09/26/2014 Comorbid Glaucoma, Asthma, Hypertension, Weeks Of Treatment: 29 History: Peripheral Venous Disease, Type II Clustered Wound: No Diabetes Photos Photo Uploaded By: Regan Lemming on 04/26/2015 16:35:36 Wound Measurements Length: (cm) 1 Width: (cm) 1 Depth: (cm) 0.1 Area: (cm) 0.785 Volume: (cm) 0.079 % Reduction in Area: 79.2% % Reduction in Volume: 95.8% Epithelialization: Medium (34-66%) Tunneling: No Undermining: No Wound Description Classification: Partial Thickness Foul O Diabetic Severity (Wagner): Grade 1 Wound Margin: Distinct,  outline attached Exudate Amount: Small Exudate Type: Serous Exudate Color: amber dor After Cleansing: No Wound Bed Granulation Amount: Medium (34-66%) Exposed Structure Granulation Quality: Pink, Pale Fascia Exposed: No Necrotic Amount: None Present (0%) Fat Layer Exposed: No [name], [name] ([id number]) Tendon Exposed: No Muscle Exposed: No Joint Exposed: No Bone Exposed: No Limited to Skin Breakdown Periwound Skin Texture Texture Color No Abnormalities Noted: No No Abnormalities Noted: No Callus: No Atrophie Blanche: No Crepitus: No Cyanosis: No Excoriation: No Ecchymosis: No Fluctuance: No Erythema: No Friable: No Hemosiderin Staining: Yes Induration: No Mottled: No Localized Edema: Yes Pallor: No Rash: No Rubor: No Scarring: No Temperature / Pain Moisture Temperature: No Abnormality No Abnormalities Noted: No Dry / Scaly: No Maceration: No Moist: Yes Wound Preparation Ulcer Cleansing: Rinsed/Irrigated with Saline Topical Anesthetic Applied: Other: lidocaine 4%, Treatment Notes Wound #6 (Left Achilles) 1. Cleansed with: Clean wound with Normal Saline 3. Peri-wound Care: Skin Prep 4. Dressing Applied: Prisma Ag 5. Secondary Dressing Applied Bordered Foam Dressing Electronic  Signature(s) Signed: 04/26/2015 8:15:40 AM By: Regan Lemming BSN, RN Entered By: Regan Lemming on 04/26/2015 08:15:40 [name] ([id number]) -------------------------------------------------------------------------------- Vitals Details Patient Name: [name] Date of Service: 04/26/2015 8:00 AM Medical Record Number: [id number] Patient Account Number: [id number] Date of Birth/Sex: 1928-03-10 (81 y.o. Female) Treating RN: Afful, RN, BSN, Velva Harman Primary Care Physician: Constance Goltz Other Clinician: Referring Physician: Constance Goltz Treating Physician/Extender: Frann Rider in Treatment: 32 Vital Signs Time Taken: 08:12 Temperature (F): 98.4 Height (in): 64 Pulse (bpm): 68 Weight (lbs): 180 Respiratory Rate (breaths/min): 17 Body Mass Index (BMI): 30.9 Blood Pressure (mmHg): 147/60 Reference Range: 80 - 120 mg / dl Electronic Signature(s) Signed: 04/26/2015 8:12:40 AM By: Regan Lemming BSN, RN Entered By: Regan Lemming on 04/26/2015 08:12:40

## 2015-04-26 NOTE — Progress Notes (Addendum)
TOY, CUMPTON ([id number]) Visit Report for 04/26/2015 Chief Complaint Document Details Patient Name: [name], [name] Date of Service: 04/26/2015 8:00 AM Medical Record Number: [id number] Patient Account Number: [id number] Date of Birth/Sex: [birth year] (81 y.o. Female) Treating RN: Afful, RN, BSN, Velva Harman Primary Care Physician: Constance Goltz Other Clinician: Referring Physician: Constance Goltz Treating Physician/Extender: Frann Rider in Treatment: 34 Information Obtained from: Patient Chief Complaint Patient presents to the wound care center today with an open arterial ulcer on the left foot big toe and lateral heal. She also has some superficial ulcerations on the second and third toe dorsum. she has no family member with her today and she is a very poor historian and cannot give a proper history. As stated that she walks around with a walker. Electronic Signature(s) Signed: 04/26/2015 8:24:29 AM By: Christin Fudge MD, FACS Entered By: Christin Fudge on 04/26/2015 08:24:29 [name] ([id number]) -------------------------------------------------------------------------------- HPI Details Patient Name: [name] Date of Service: 04/26/2015 8:00 AM Medical Record Number: [id number] Patient Account Number: [id number] Date of Birth/Sex: Aug 20, 1927 (81 y.o. Female) Treating RN: Baruch Gouty, RN, BSN, Velva Harman Primary Care Physician: Constance Goltz Other Clinician: Referring Physician: Constance Goltz Treating Physician/Extender: Frann Rider in Treatment: 32 History of Present Illness Location: left heel Quality: Patient reports experiencing a dull pain to affected area(s). Severity: Patient states wound are getting worse. Duration: Patient states that they are not certain how long the wound has been present. Timing: Pain in wound is Intermittent (comes and goes Context: The wound appeared gradually over time Associated Signs and Symptoms: Patient reports having difficulty standing  for long periods. HPI Description: This 81 year old patient is a poor historian and comes from a nursing home. Does not have any family members with her. Says she has a ulcer on the lateral part of her left foot and some new ones are there on her right foot too. She is unable to say how long she's had these. she does walk with a walker and this is limited most of the day. Does not recall if she has had any vascular studies done. I have reviewed an x-ray done of the left foot which basically shows osteoporosis but no evidence of fracture dislocation or osteomyelitis. 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 She seems to be doing better overall. 10/19/14 -- I understand she has gone to the vascular surgery and lab yesterday and has had all her tests done yesterday. She is doing fine otherwise and has had no fresh issues. 10/26/2014 she seems to be doing well and has no fresh problems and seems much brighter. 10/12/14 -- Taking her antibiotic and continues to do her dressings locally and she is helped by her skilled nursing. She seems to be doing fine and has no fresh issues. 10/19/14 -- she is doing fine and has had no fresh issues. She says her son to go for her vascular workup yesterday and she has been called back after 3 months. we will try and gather these reports to review what they said. 10/26/14 --I have been able to review notes from the vascular surgery office and these were dated from 10/18/2014. He patient was on her first postop visit and had Doppler ultrasounds done of her arteries. She had more than 50% stenosis of the right superficial femoral artery and more than 50% stenosis in the  left tibioperoneal trunk. She was status post a left lower extremity angiogram with angioplasty of the peroneal and left  superficial femoral arteries for a nonhealing left foot and ankle ulceration. note is made of the fact that the ABIs had not changed in the last month since her previous visit. The opinion of her provider was that she did not need any intervention at this time and they had done everything they could at the present time. They would continue on Plavix and antibiotics and see her back in 3 months for a another arterial duplex study of the lower extremities. [name], [name] ([id number]) 11/02/2014 -- she has spoken to her caregivers about coming for HBO 5 times per week for 6-8 weeks and they are agreeable about this and she is willing to undergo hyperbaric oxygen therapy. 11/16/2014 she has been worked up with a EKG and chest x-ray and these were within normal limits. As far as the investigations go she is ready for HBOT. we are awaiting some insurance clearance so that she can start on her hyperbaric oxygen therapy. 11/30/2014 she tolerated hyperbaric oxygen therapy fine and there were no issues with her blood glucose levels. As noted she is not a diabetic and most of her fingerstick blood glucose levels are inaccurate because of the Raynaud's phenomena. She normally has to have your lobe sticks to get any random glucose levels. I have also requested her primary care does a venous blood glucose draw to check her glucose level. 12/21/2014 -- she is tolerating hyperbaric oxygen very well and has overall made a lot of progress. Her left leg is looking much better. 01/11/2015 - No new complaints today. New ulceration on the dorsum of the right second toe noted today on physical exam. Patient unaware. No significant pain. No fever or chills. No significant drainage. Offloading with sage boots at night. Significantly improved with HBO. 01/19/2015 - the patient has had a x-ray of the right foot done at the nursing home but we still do not have the report and we are awaiting this result. She is otherwise  doing fine with her general health. 01/25/2015 -- X-ray of her right foot showed no acute fracture, dislocation, bony infection or osteomyelitis. The was some soft tissue swelling and osteopenia seen. 02/15/2015 -- due to some insurance issues we could not get her epifix and we are working with her company to get her this. She is doing fine otherwise and has no fresh issues. 04/04/2015 -- she had a follow-up with the vascular surgeon and he seemed to be pleased with her progress and has had no problems with her vascularity. Electronic Signature(s) Signed: 04/26/2015 8:24:40 AM By: Christin Fudge MD, FACS Entered By: Christin Fudge on 04/26/2015 08:24:39 [name], [name] ([id number]) -------------------------------------------------------------------------------- Physical Exam Details Patient Name: [name] Date of Service: 04/26/2015 8:00 AM Medical Record Number: [id number] Patient Account Number: [id number] Date of Birth/Sex: 28-Mar-1928 (81 y.o. Female) Treating RN: Afful, RN, BSN, Velva Harman Primary Care Physician: Constance Goltz Other Clinician: Referring Physician: Constance Goltz Treating Physician/Extender: Frann Rider in Treatment: 32 Constitutional . Pulse regular. Respirations normal and unlabored. Afebrile. . Eyes Nonicteric. Reactive to light. Ears, Nose, Mouth, and Throat Lips, teeth, and gums WNL.Marland Kitchen Moist mucosa without lesions . Neck supple and nontender. No palpable supraclavicular or cervical adenopathy. Normal sized without goiter. Respiratory WNL. No retractions.. Cardiovascular Pedal Pulses WNL. No clubbing, cyanosis or edema. Chest Breasts symmetical and no nipple discharge.. Breast tissue WNL, no masses, lumps, or tenderness.. Lymphatic No adneopathy. No  adenopathy. No adenopathy. Musculoskeletal Adexa without tenderness or enlargement.. Digits and nails w/o clubbing, cyanosis, infection, petechiae, ischemia, or inflammatory conditions.. Integumentary (Hair,  Skin) No suspicious lesions. No crepitus or fluctuance. No peri-wound warmth or erythema. No masses.Marland Kitchen Psychiatric Judgement and insight Intact.. No evidence of depression, anxiety, or agitation.. Notes wound on the left Achilles tendon is now a bit smaller but continues to have healthy granulation tissue. Electronic Signature(s) Signed: 04/26/2015 8:25:10 AM By: Christin Fudge MD, FACS Entered By: Christin Fudge on 04/26/2015 08:25:10 [name] ([id number]) -------------------------------------------------------------------------------- Physician Orders Details Patient Name: [name] Date of Service: 04/26/2015 8:00 AM Medical Record Number: [id number] Patient Account Number: [id number] Date of Birth/Sex: 1928-04-19 (81 y.o. 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 - be sure to clean out all remaining prisma Anesthetic Wound #6 Left Achilles o Topical Lidocaine 4% cream applied to wound bed prior to debridement Primary Wound Dressing Wound #6 Left Achilles o Prisma Ag - or collagen with silver equivalent - triple layer please Secondary Dressing Wound #6 Left Achilles o Boardered Foam Dressing Dressing Change Frequency Wound #6 Left Achilles o Change dressing every week - Can be changed as needed for excess drainage. Follow-up  Appointments Wound #6 Left Achilles o Return Appointment in 1 week. Off-Loading Wound #6 Left Achilles o Other: - SAGE boots while sitting or lying. Do not wear while ambulating. [name], [name] ([id number]) Home Health Wound #6 Left Achilles o [place] Visits - [facility] Nurse may visit PRN to address patientos wound care needs. o FACE TO FACE ENCOUNTER: MEDICARE and MEDICAID PATIENTS: I certify that this patient is under my care and that I had a face-to-face encounter that meets the physician face-to-face encounter requirements with this patient on this date. The encounter with the patient was in whole or in part for the following MEDICAL CONDITION: (primary reason for [place]) MEDICAL NECESSITY: I certify, that based on my findings, NURSING services are a medically necessary home health service. HOME BOUND STATUS: I certify that my clinical findings support that this patient is homebound (i.e., Due to illness or injury, pt requires aid of supportive devices such as crutches, cane, wheelchairs, walkers, the use of special transportation or the assistance of another person to leave their place of residence. There is a normal inability to leave the home and doing so requires considerable and taxing effort. Other absences are for medical reasons / religious services and are infrequent or of short duration when for other reasons). o If current dressing causes regression in wound condition, may D/C ordered dressing product/s and apply Normal Saline Moist Dressing daily until next [place] / Other MD appointment. [place] of regression in wound condition at [phone]. o Please direct any NON-WOUND related issues/requests for orders to patient's Primary Care Physician Electronic Signature(s) Signed: 04/26/2015 8:31:44 AM By: Regan Lemming BSN, RN Signed: 04/26/2015 4:23:40 PM By: Christin Fudge MD, FACS Entered By:  Regan Lemming on 04/26/2015 08:31:44 [name], [name] ([id number]) -------------------------------------------------------------------------------- Problem List Details Patient Name: [name], [name] Date of Service: 04/26/2015 8:00 AM Medical Record Number: [id number] Patient Account Number: [id number] Date of Birth/Sex: [birth year] (81 y.o. Female) Treating RN: Afful, RN, BSN, Velva Harman Primary Care Physician: Constance Goltz Other Clinician: Referring Physician: Constance Goltz Treating Physician/Extender: Frann Rider in Treatment: 70 Active Problems ICD-10 Encounter Code Description Active Date Diagnosis L97.422 Non-pressure chronic ulcer of left heel and midfoot with fat 09/12/2014 Yes layer exposed 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 09/12/2014 Yes of heel and midfoot I70.235 Atherosclerosis of native arteries of right leg with 09/12/2014 Yes ulceration of other part of foot Inactive Problems Resolved Problems ICD-10 Code Description Active Date Resolved Date L97.412 Non-pressure chronic ulcer of right heel and midfoot with 10/26/2014 10/26/2014 fat layer exposed S91.104A Unspecified open wound of right lesser toe(s) without 01/11/2015 01/11/2015 damage to nail, initial encounter Electronic Signature(s) Signed: 04/26/2015 8:24:23 AM By: Christin Fudge MD, FACS Entered By: Christin Fudge on 04/26/2015 08:24:23 [name] ([id number]) -------------------------------------------------------------------------------- Progress Note Details Patient Name: [name] Date of Service: 04/26/2015 8:00 AM Medical Record Number: [id number] Patient Account Number: [id number] Date of Birth/Sex: Aug 18, 1928 (81 y.o. Female) Treating RN: Afful, RN, BSN, Velva Harman Primary Care Physician: Constance Goltz Other Clinician: Referring Physician: Constance Goltz Treating Physician/Extender: Frann Rider in Treatment: 77 Subjective Chief Complaint Information obtained  from Patient Patient presents to the wound care center today with an open arterial ulcer on the left foot big toe and lateral heal. She also has some superficial ulcerations on the second and third toe dorsum. she has no family member with her today and she is a very poor historian and cannot give a proper history. As stated that she walks around with a walker. History of Present Illness (HPI) The following HPI elements were documented for the patient's wound: Location: left heel Quality: Patient reports experiencing a dull pain to affected area(s). Severity: Patient states wound are getting worse. Duration: Patient states that they are not certain how long the wound has been present. Timing: Pain in wound is Intermittent (comes and goes Context: The wound appeared gradually over time Associated Signs and Symptoms: Patient reports having difficulty standing for long periods. This 81 year old patient is a poor historian and comes from a nursing home. Does not have any family members with her. Says she has a ulcer on the lateral part of her left foot and some new ones are there on her right foot too. She is unable to say how long she's had these. she does walk with a walker and this is limited most of the day. Does not recall if she has had any vascular studies done. I have reviewed an x-ray done of the left foot which basically shows osteoporosis but no evidence of fracture dislocation or osteomyelitis. 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 She seems to be doing better overall. 10/19/14 -- I understand she has gone to the vascular surgery and lab yesterday and has had all her tests done yesterday. She is doing fine otherwise and has had no fresh issues. 10/26/2014 she seems to be doing well and has no fresh problems and  seems much brighter. 10/12/14 -- Taking her antibiotic and continues to do her dressings locally and she is helped by  her skilled nursing. She seems to be doing fine and has no fresh issues. 10/19/14 -- she is doing fine and has had no fresh issues. She says her son to go for her vascular workup yesterday and she has been called back after 3 months. we will try and gather these reports to review what [name], [name] ([id number]) they said. 10/26/14 --I have been able to review notes from the vascular surgery office and these were dated from 10/18/2014. He patient was on her first postop visit and had Doppler ultrasounds done of her arteries. She had more than 50% stenosis of the right superficial femoral artery and more than 50% stenosis in the left tibioperoneal trunk. She was status post a left lower extremity angiogram with angioplasty of the peroneal and left superficial femoral arteries for a nonhealing left foot and ankle ulceration. note is made of the fact that the ABIs had not changed in the last month since her previous visit. The opinion of her provider was that she did not need any intervention at this time and they had done everything they could at the present time. They would continue on Plavix and antibiotics and see her back in 3 months for a another arterial duplex study of the lower extremities. 11/02/2014 -- she has spoken to her caregivers about coming for HBO 5 times per week for 6-8 weeks and they are agreeable about this and she is willing to undergo hyperbaric oxygen therapy. 11/16/2014 she has been worked up with a EKG and chest x-ray and these were within normal limits. As far as the investigations go she is ready for HBOT. we are awaiting some insurance clearance so that she can start on her hyperbaric oxygen therapy. 11/30/2014 she tolerated hyperbaric oxygen therapy fine and there were no issues with her blood glucose levels. As noted she is not a diabetic and most of  her fingerstick blood glucose levels are inaccurate because of the Raynaud's phenomena. She normally has to have your lobe sticks to get any random glucose levels. I have also requested her primary care does a venous blood glucose draw to check her glucose level. 12/21/2014 -- she is tolerating hyperbaric oxygen very well and has overall made a lot of progress. Her left leg is looking much better. 01/11/2015 - No new complaints today. New ulceration on the dorsum of the right second toe noted today on physical exam. Patient unaware. No significant pain. No fever or chills. No significant drainage. Offloading with sage boots at night. Significantly improved with HBO. 01/19/2015 - the patient has had a x-ray of the right foot done at the nursing home but we still do not have the report and we are awaiting this result. She is otherwise doing fine with her general health. 01/25/2015 -- X-ray of her right foot showed no acute fracture, dislocation, bony infection or osteomyelitis. The was some soft tissue swelling and osteopenia seen. 02/15/2015 -- due to some insurance issues we could not get her epifix and we are working with her company to get her this. She is doing fine otherwise and has no fresh issues. 04/04/2015 -- she had a follow-up with the vascular surgeon and he seemed to be pleased with her progress and has had no problems with her vascularity. [name], [name] ([id number]) Constitutional Pulse regular. Respirations normal and unlabored. Afebrile. Vitals Time Taken: 8:12 AM, Height: 64 in, Weight: 180 lbs, BMI: 30.9, Temperature: 98.4 F, Pulse: 68 bpm, Respiratory Rate: 17 breaths/min, Blood Pressure: 147/60 mmHg. Eyes  Nonicteric. Reactive to light. Ears, Nose, Mouth, and Throat Lips, teeth, and gums WNL.Marland Kitchen Moist mucosa without lesions . Neck supple and nontender. No palpable supraclavicular or cervical adenopathy. Normal sized without goiter. Respiratory WNL. No  retractions.. Cardiovascular Pedal Pulses WNL. No clubbing, cyanosis or edema. Chest Breasts symmetical and no nipple discharge.. Breast tissue WNL, no masses, lumps, or tenderness.. Lymphatic No adneopathy. No adenopathy. No adenopathy. Musculoskeletal Adexa without tenderness or enlargement.. Digits and nails w/o clubbing, cyanosis, infection, petechiae, ischemia, or inflammatory conditions.Marland Kitchen Psychiatric Judgement and insight Intact.. No evidence of depression, anxiety, or agitation.. General Notes: wound on the left Achilles tendon is now a bit smaller but continues to have healthy granulation tissue. Integumentary (Hair, Skin) No suspicious lesions. No crepitus or fluctuance. No peri-wound warmth or erythema. No masses.. Wound #6 status is Open. Original cause of wound was Gradually Appeared. The wound is located on the Left Achilles. The wound measures 1cm length x 1cm width x 0.1cm depth; 0.785cm^2 area and 0.079cm^3 volume. The wound is limited to skin breakdown. There is no tunneling or undermining noted. There is a small amount of serous drainage noted. The wound margin is distinct with the outline attached to the wound base. There is medium (34-66%) pink, pale granulation within the wound bed. There is no necrotic tissue within the wound bed. The periwound skin appearance exhibited: Localized Edema, Moist, Hemosiderin Staining. The periwound skin appearance did not exhibit: Callus, Crepitus, Excoriation, Fluctuance, Friable, Induration, Rash, Scarring, Dry/Scaly, Maceration, Atrophie [name], Cyanosis, [name], [name], [name] ([id number]) Pallor, Rubor, Erythema. Periwound temperature was noted as No Abnormality. 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 He'll then we will continue to use silver collagen and a bordered foam and offloading. She will come back and see as next week. Plan Wound Cleansing: Wound #6 Left Achilles: Clean wound with Normal Saline. - be sure to clean out all remaining prisma Anesthetic: Wound #6 Left Achilles: Topical Lidocaine 4% cream applied to wound bed prior to debridement Primary Wound Dressing: Wound #6 Left Achilles: Prisma Ag - or collagen with silver equivalent - triple layer please Secondary Dressing: Wound #6 Left Achilles: Boardered Foam Dressing Dressing Change Frequency: Wound #6 Left Achilles: Change dressing every week - Can be changed as needed for excess drainage. Follow-up Appointments: Wound #6 Left Achilles: Return Appointment in 1 week. Off-Loading: Wound #6 Left Achilles: Other: - SAGE boots while sitting or lying. Do not wear while ambulating. Home Health: Wound #6 Left Achilles: Continue Home Health Visits - Oneshia Ubaldo, [place] ([id number]) Home Health Nurse may visit PRN to address patient s wound care needs. FACE TO FACE ENCOUNTER: MEDICARE and MEDICAID PATIENTS: I certify that this patient is under my care and that I had a face-to-face encounter that meets the physician face-to-face encounter requirements with this patient on this date. The encounter with the patient was in whole or in part for the following MEDICAL CONDITION: (primary reason for [place]) MEDICAL NECESSITY: I certify, that based on my findings, NURSING services are a medically necessary home health service. HOME BOUND STATUS: I certify that my clinical findings support that this patient is homebound (  i.e., Due to illness or injury, pt requires aid of supportive devices such as crutches, cane, wheelchairs, walkers, the use of special  transportation or the assistance of another person to leave their place of residence. There is a normal inability to leave the home and doing so requires considerable and taxing effort. Other absences are for medical reasons / religious services and are infrequent or of short duration when for other reasons). If current dressing causes regression in wound condition, may D/C ordered dressing product/s and apply Normal Saline Moist Dressing daily until next [facility] / Other MD appointment. [place] of regression in wound condition at [phone]. Please direct any NON-WOUND related issues/requests for orders to patient's Primary Care Physician We are awaiting her to get on a trial of Puraply and hopefully we will have this for her soon. He'll then we will continue to use silver collagen and a bordered foam and offloading. She will come back and see as next week. Electronic Signature(s) Signed: 04/27/2015 4:30:06 PM By: Christin Fudge MD, FACS Previous Signature: 04/26/2015 8:26:17 AM Version By: Christin Fudge MD, FACS Entered By: Christin Fudge on 04/27/2015 16:30:06 [name] ([id number]) -------------------------------------------------------------------------------- SuperBill Details Patient Name: [name] Date of Service: 04/26/2015 Medical Record Number: [id number] Patient Account Number: [id number] Date of Birth/Sex: 14-Apr-1928 (81 y.o. 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 arteries of right leg with ulceration of other part of  foot Facility Procedures CPT4 Code: [id number] Description: [id number] - WOUND CARE VISIT-LEV 2 EST PT Modifier: Quantity: 1 Physician Procedures CPT4: Description Modifier Quantity Code [id number] - WC PHYS LEVEL 3 - EST PT 1 ICD-10 Description Diagnosis 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 of native arteries of right leg with ulceration of other part of foot Electronic Signature(s) Signed: 04/26/2015 8:32:29 AM By: Regan Lemming BSN, RN Signed: 04/26/2015 4:23:40 PM By: Christin Fudge MD, FACS Previous Signature: 04/26/2015 8:26:29 AM Version By: Christin Fudge MD, FACS Entered By: Regan Lemming on 04/26/2015 08:32:29

## 2015-05-04 ENCOUNTER — Encounter: Payer: Medicare Other | Admitting: Surgery

## 2015-05-04 DIAGNOSIS — L97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: Secondary | ICD-10-CM | POA: Diagnosis not present

## 2015-05-04 NOTE — Progress Notes (Addendum)
CIRCE, IWAI ([id number]) Visit Report for 05/04/2015 Chief Complaint Document Details Patient Name: [name], [name] Date of Service: 05/04/2015 9:30 AM Medical Record Number: [id number] Patient Account Number: [id number] Date of Birth/Sex: May 14, 1928 (81 y.o. Female) Treating RN: [name] Primary Care Physician: [name] Other Clinician: Referring Physician: Constance [name] Treating Physician/Extender: [name] in Treatment: 75 Information Obtained from: Patient Chief Complaint Patient presents to the wound care center today with an open arterial ulcer on the left foot big toe and lateral heal. She also has some superficial ulcerations on the second and third toe dorsum. she has no family member with her today and she is a very poor historian and cannot give a proper history. As stated that she walks around with a walker. Electronic Signature(s) Signed: 05/04/2015 10:00:11 AM By: [name] Fudge [name], FACS Previous Signature: 05/04/2015 9:47:16 AM Version By: [name] Fudge [name], FACS Entered By: [name] on 05/04/2015 10:00:10 [name] ([id number]) -------------------------------------------------------------------------------- Cellular or Tissue Based Product Details Patient Name: [name] Date of Service: 05/04/2015 9:30 AM Medical Record Number: [id number] Patient Account Number: [id number] Date of Birth/Sex: 12/01/1927 (81 y.o. Female) Treating RN: [name] Primary Care Physician: [name] Other Clinician: Referring Physician: Constance [name] Treating Physician/Extender: [name] in Treatment: 33 Cellular or Tissue Based Wound #6 Left Achilles Product Type [name] to: Performed By: Physician [name] Cellular or Tissue Based Other Product Type: Time-Out Taken: Yes Location: genitalia / hands / feet / multiple digits Wound Size (sq cm): 1 Product Size (sq cm): 4 Waste Size (sq cm): 0 Amount of Product  [name] (sq cm): 4 Lot #: [id number] Expiration Date: 04/24/2016 Fenestrated: No Reconstituted: Yes Solution Type: saline Solution Amount: 19ml Lot #: [id number] Solution Expiration 12/16/2016 Date: Secured: Yes Secured With: Steri-Strips Dressing [name]: Yes Primary Dressing: mepitel Procedural Pain: 0 Post Procedural Pain: 0 Response to Treatment: Procedure was tolerated well Post Procedure Diagnosis Same as Pre-procedure Notes [name] Electronic Signature(s) Signed: 05/04/2015 10:00:00 AM By: [name] Fudge [name], FACS Entered By: [name] on 05/04/2015 10:00:00 [name] ([id number]) [name] ([id number]) -------------------------------------------------------------------------------- HPI Details Patient Name: [name] Date of Service: 05/04/2015 9:30 AM Medical Record Number: [id number] Patient Account Number: [id number] Date of Birth/Sex: [birth year] (81 y.o. Female) Treating RN: [name] Primary Care Physician: [name] Other Clinician: Referring Physician: Constance [name] Treating Physician/Extender: [name] in Treatment: 83 History of Present Illness Location: left heel Quality: Patient reports experiencing a dull pain to affected area(s). Severity: Patient states wound are getting worse. Duration: Patient states that they are not certain how long the wound has been present. Timing: Pain in wound is Intermittent (comes and goes Context: The wound appeared gradually over time Associated Signs and Symptoms: Patient reports having difficulty standing for long periods. HPI Description: This 81 year old patient is a poor historian and comes from a nursing home. Does not have any family members with her. Says she has a ulcer on the lateral part of her left foot and some new ones are there on her right foot too. She is unable to say how long she's had these. she does walk with a walker and this is limited most of the day. Does not  recall if she has had any vascular studies done. I have reviewed an x-ray done of the left foot which basically shows osteoporosis but no evidence of fracture dislocation or osteomyelitis. 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 She seems to be doing better overall. 10/19/14 -- I understand she has gone to the vascular surgery and lab yesterday and has had all her tests done yesterday. She is doing fine otherwise and has had no fresh issues. 10/26/2014 she seems to be doing well and has no fresh problems and seems much brighter. 10/12/14 -- Taking her antibiotic and continues to do her dressings locally and she is helped by her skilled nursing. She seems to be doing fine and has no fresh issues. 10/19/14 -- she is doing fine and has had no fresh issues. She says her son to go for her vascular workup yesterday and she has been called back after 3 months. we will try and gather these reports to review what they said. 10/26/14 --I have been able to review notes from the vascular surgery office and these were dated from 10/18/2014. He patient was on her first postop visit and had Doppler ultrasounds done of her arteries. She had more than 50% stenosis of the right superficial femoral artery and more than 50% stenosis in the left tibioperoneal trunk. She was status post a left lower extremity angiogram with angioplasty of the peroneal and left superficial femoral arteries for a nonhealing left foot and ankle ulceration. note is made of the fact that the ABIs had not changed in the last month since her previous visit. The opinion of her provider was that she did not need any intervention at this time and they had done everything they could at the present time. They would continue on Plavix and antibiotics and see her back in 3 months  for a another arterial duplex study of the lower extremities. [name] ([id number]) 11/02/2014 -- she has spoken to her caregivers about coming for HBO 5 times per week for 6-8 weeks and they are agreeable about this and she is willing to undergo hyperbaric oxygen therapy. 11/16/2014 she has been worked up with a EKG and chest x-ray and these were within normal limits. As far as the investigations go she is ready for HBOT. we are awaiting some insurance clearance so that she can start on her hyperbaric oxygen therapy. 11/30/2014 she tolerated hyperbaric oxygen therapy fine and there were no issues with her blood glucose levels. As noted she is not a diabetic and most of her fingerstick blood glucose levels are inaccurate because of the Raynaud's phenomena. She normally has to have your lobe sticks to get any random glucose levels. I have also requested her primary care does a venous blood glucose draw to check her glucose level. 12/21/2014 -- she is tolerating hyperbaric oxygen very well and has overall made a lot of progress. Her left leg is looking much better. 01/11/2015 - No new complaints today. New ulceration on the dorsum of the right second toe noted today on physical exam. Patient unaware. No significant pain. No fever or chills. No significant drainage. Offloading with sage boots at night. Significantly improved with HBO. 01/19/2015 - the patient has had a x-ray of the right foot done at the nursing home but we still do not have the report and we are awaiting this result. She is otherwise doing fine with her general health. 01/25/2015 -- X-ray of her right foot showed no acute fracture, dislocation, bony infection or osteomyelitis. The was some soft tissue swelling and osteopenia seen. 02/15/2015 -- due to some  insurance issues we could not get her epifix and we are working with her company to get her this. She is doing fine otherwise and has no fresh issues. 04/04/2015 -- she  had a follow-up with the vascular surgeon and he seemed to be pleased with her progress and has had no problems with her vascularity. 05/04/2015 -- the wound on the left posterior ankle had stalled and after talking to the [employer] we have obtained her samples of [name] and we will use this on a weekly basis. Electronic Signature(s) Signed: 05/04/2015 10:00:24 AM By: [name] Fudge [name], FACS Previous Signature: 05/04/2015 9:50:19 AM Version By: [name] Fudge [name], FACS Entered By: [name] on 05/04/2015 10:00:24 [name] ([id number]) -------------------------------------------------------------------------------- Physical Exam Details Patient Name: [name] Date of Service: 05/04/2015 9:30 AM Medical Record Number: [id number] Patient Account Number: [id number] Date of Birth/Sex: 1928-04-13 (81 y.o. Female) Treating RN: [name] Primary Care Physician: [name] Other Clinician: Referring Physician: Constance [name] Treating Physician/Extender: [name] in Treatment: 33 Constitutional . Pulse regular. Respirations normal and unlabored. Afebrile. . Eyes Nonicteric. Reactive to light. Ears, Nose, Mouth, and Throat Lips, teeth, and gums WNL.Marland Kitchen Moist mucosa without lesions . Neck supple and nontender. No palpable supraclavicular or cervical adenopathy. Normal sized without goiter. Respiratory WNL. No retractions.. Breath sounds WNL, No rubs, rales, rhonchi, or wheeze.. Cardiovascular Heart rhythm and rate regular, no murmur or gallop.. Pedal Pulses WNL. No clubbing, cyanosis or edema. Lymphatic No adneopathy. No adenopathy. No adenopathy. Musculoskeletal Adexa without tenderness or enlargement.. Digits and nails w/o clubbing, cyanosis, infection, petechiae, ischemia, or inflammatory conditions.. Integumentary (Hair, Skin) No suspicious lesions. No crepitus or fluctuance. No peri-wound warmth or erythema. No  masses.Marland Kitchen Psychiatric Judgement and insight Intact.. No evidence of depression, anxiety, or agitation.. Notes The wound has got some slough and debris which will be sharply debrided before applying the skin substitute Electronic Signature(s) Signed: 05/04/2015 10:01:05 AM By: [name] Fudge [name], FACS Entered By: [name] on 05/04/2015 10:01:05 [name] ([id number]) -------------------------------------------------------------------------------- Physician Orders Details Patient Name: [name] Date of Service: 05/04/2015 9:30 AM Medical Record Number: [id number] Patient Account Number: [id number] Date of Birth/Sex: 02-07-1928 (81 y.o. 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 Anesthetic Wound #6 Left Achilles o Topical Lidocaine 4% cream [name] to wound bed prior to debridement Skin Barriers/Peri-Wound Care Wound #6 Left Achilles o Skin Prep Primary Wound Dressing Wound #6 Left Achilles o Other: - [name] antimicrobial wound matrix Secondary Dressing Wound #6 Left Achilles o Gauze and Kerlix/Conform Dressing Change Frequency Wound #6 Left Achilles o Change dressing every week Follow-up Appointments Wound #6 Left Achilles o Return Appointment in 1 week. ARSHA, OSTERMEIER ([id number]) Electronic Signature(s) Signed: 05/04/2015  4:40:49 PM By: Regan Lemming BSN, RN Signed: 05/04/2015 4:45:33 PM By: [name] Fudge [name], FACS Entered By: Regan Lemming on 05/04/2015 09:56:31 [name] ([id number]) -------------------------------------------------------------------------------- Problem List Details Patient Name: [name] Date of Service: 05/04/2015 9:30 AM Medical  Record Number: [id number] Patient Account Number: [id number] Date of Birth/Sex: 11-Aug-1928 (81 y.o. Female) Treating RN: [name] Primary Care Physician: [name] Other Clinician: Referring Physician: Constance [name] Treating Physician/Extender: [name] in Treatment: 24 Active Problems ICD-10 Encounter Code Description Active Date Diagnosis L97.422 Non-pressure chronic ulcer of left heel and midfoot with fat 09/12/2014 Yes layer exposed 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 09/12/2014 Yes of heel and midfoot I70.235 Atherosclerosis of native arteries of right leg with 09/12/2014 Yes ulceration of other part of foot Inactive Problems Resolved Problems ICD-10 Code Description Active Date Resolved Date L97.412 Non-pressure chronic ulcer of right heel and midfoot with 10/26/2014 10/26/2014 fat layer exposed S91.104A Unspecified open wound of right lesser toe(s) without 01/11/2015 01/11/2015 damage to nail, initial encounter Electronic Signature(s) Signed: 05/04/2015 9:45:53 AM By: [name] Fudge [name], FACS Entered By: [name] on 05/04/2015 09:45:53 [name] ([id number]) -------------------------------------------------------------------------------- Progress Note Details Patient Name: [name] Date of Service: 05/04/2015 9:30 AM Medical Record Number: [id number] Patient Account Number: [id number] Date of Birth/Sex: [birth year] (81 y.o. Female) Treating RN: [name] Primary Care Physician: [name] Other Clinician: Referring Physician: Constance [name] Treating  Physician/Extender: [name] in Treatment: 83 Subjective Chief Complaint Information obtained from Patient Patient presents to the wound care center today with an open arterial ulcer on the left foot big toe and lateral heal. She also has some superficial ulcerations on the second and third toe dorsum. she has no family member with her today and she is a very poor historian and cannot give a proper history. As stated that she walks around with a walker. History of Present Illness (HPI) The following HPI elements were documented for the patient's wound: Location: left heel Quality: Patient reports experiencing a dull pain to affected area(s). Severity: Patient states wound are getting worse. Duration: Patient states that they are not certain how long the wound has been present. Timing: Pain in wound is Intermittent (comes and goes Context: The wound appeared gradually over time Associated Signs and Symptoms: Patient reports having difficulty standing for long periods. This 81 year old patient is a poor historian and comes from a nursing home. Does not have any family members with her. Says she has a ulcer on the lateral part of her left foot and some new ones are there on her right foot too. She is unable to say how long she's had these. she does walk with a walker and this is limited most of the day. Does not recall if she has had any vascular studies done. I have reviewed an x-ray done of the left foot which basically shows osteoporosis but no evidence of fracture dislocation or osteomyelitis. 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 She seems to be doing better overall. 10/19/14 -- I understand she has gone to the vascular surgery and lab yesterday and has had all her tests done yesterday. She is doing fine  otherwise and has had no fresh issues. 10/26/2014 she seems to be doing well and has no fresh problems and seems much brighter. 10/12/14 -- Taking her antibiotic and continues to do her dressings locally and she is helped by her skilled nursing. She seems to be doing fine and has no fresh issues. 10/19/14 -- she is doing fine and has had no fresh  issues. She says her son to go for her vascular workup yesterday and she has been called back after 3 months. we will try and gather these reports to review what BRADI, ERKKILA ([id number]) they said. 10/26/14 --I have been able to review notes from the vascular surgery office and these were dated from 10/18/2014. He patient was on her first postop visit and had Doppler ultrasounds done of her arteries. She had more than 50% stenosis of the right superficial femoral artery and more than 50% stenosis in the left tibioperoneal trunk. She was status post a left lower extremity angiogram with angioplasty of the peroneal and left superficial femoral arteries for a nonhealing left foot and ankle ulceration. note is made of the fact that the ABIs had not changed in the last month since her previous visit. The opinion of her provider was that she did not need any intervention at this time and they had done everything they could at the present time. They would continue on Plavix and antibiotics and see her back in 3 months for a another arterial duplex study of the lower extremities. 11/02/2014 -- she has spoken to her caregivers about coming for HBO 5 times per week for 6-8 weeks and they are agreeable about this and she is willing to undergo hyperbaric oxygen therapy. 11/16/2014 she has been worked up with a EKG and chest x-ray and these were within normal limits. As far as the investigations go she is ready for HBOT. we are awaiting some insurance clearance so that she can start on her hyperbaric oxygen therapy. 11/30/2014 she tolerated hyperbaric oxygen  therapy fine and there were no issues with her blood glucose levels. As noted she is not a diabetic and most of her fingerstick blood glucose levels are inaccurate because of the Raynaud's phenomena. She normally has to have your lobe sticks to get any random glucose levels. I have also requested her primary care does a venous blood glucose draw to check her glucose level. 12/21/2014 -- she is tolerating hyperbaric oxygen very well and has overall made a lot of progress. Her left leg is looking much better. 01/11/2015 - No new complaints today. New ulceration on the dorsum of the right second toe noted today on physical exam. Patient unaware. No significant pain. No fever or chills. No significant drainage. Offloading with sage boots at night. Significantly improved with HBO. 01/19/2015 - the patient has had a x-ray of the right foot done at the nursing home but we still do not have the report and we are awaiting this result. She is otherwise doing fine with her general health. 01/25/2015 -- X-ray of her right foot showed no acute fracture, dislocation, bony infection or osteomyelitis. The was some soft tissue swelling and osteopenia seen. 02/15/2015 -- due to some insurance issues we could not get her epifix and we are working with her company to get her this. She is doing fine otherwise and has no fresh issues. 04/04/2015 -- she had a follow-up with the vascular surgeon and he seemed to be pleased with her progress and has had no problems with her vascularity. 05/04/2015 -- the wound on the left posterior ankle had stalled and after talking to the [employer] we have obtained her samples of [name] and we will use this on a weekly basis. [place], Annaleia ([id number]) Constitutional Pulse regular. Respirations normal and unlabored. Afebrile. Vitals Time Taken: 9:21 AM, Height: 64 in, Weight: 180 lbs, BMI: 30.9, Temperature: 98.3 F, Pulse: 70 bpm,  Respiratory Rate: 16  breaths/min, Blood Pressure: 162/62 mmHg. Eyes Nonicteric. Reactive to light. Ears, Nose, Mouth, and Throat Lips, teeth, and gums WNL.Marland Kitchen Moist mucosa without lesions . Neck supple and nontender. No palpable supraclavicular or cervical adenopathy. Normal sized without goiter. Respiratory WNL. No retractions.. Breath sounds WNL, No rubs, rales, rhonchi, or wheeze.. Cardiovascular Heart rhythm and rate regular, no murmur or gallop.. Pedal Pulses WNL. No clubbing, cyanosis or edema. Lymphatic No adneopathy. No adenopathy. No adenopathy. Musculoskeletal Adexa without tenderness or enlargement.. Digits and nails w/o clubbing, cyanosis, infection, petechiae, ischemia, or inflammatory conditions.Marland Kitchen Psychiatric Judgement and insight Intact.. No evidence of depression, anxiety, or agitation.. General Notes: The wound has got some slough and debris which will be sharply debrided before applying the skin substitute Integumentary (Hair, Skin) No suspicious lesions. No crepitus or fluctuance. No peri-wound warmth or erythema. No masses.. Wound #6 status is Open. Original cause of wound was Gradually Appeared. The wound is located on the Left Achilles. The wound measures 1cm length x 1cm width x 0.1cm depth; 0.785cm^2 area and 0.079cm^3 volume. The wound is limited to skin breakdown. There is no tunneling or undermining noted. There is a small amount of serous drainage noted. The wound margin is distinct with the outline attached to the wound base. There is no granulation within the wound bed. There is a large (67-100%) amount of necrotic tissue within the wound bed including Adherent Slough. The periwound skin appearance exhibited: Localized Edema, Moist, Hemosiderin Staining. The periwound skin appearance did not exhibit: Callus, Crepitus, Excoriation, Fluctuance, Friable, Induration, Rash, Scarring, Dry/Scaly, Maceration, Atrophie Blanche, Cyanosis, Ecchymosis, Mottled, Pallor, Rubor, Erythema.  Periwound temperature was noted as No [place], Loraine ([id number]) Abnormality. 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 We will see her back next week. She will have weekly applications of the material after sharply debriding the wound as required. 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 A skin graft procedure using a bioengineered skin substitute/cellular or tissue based product was performed by Britto, Jackson Latino., [name]. Other was [name] and secured with Steri-Strips. 4 sq cm of product was utilized and 0 sq cm was wasted. Post Application, mepitel was [name]. A Time Out was conducted prior to the start of the procedure. The procedure was tolerated well with a pain level of 0 throughout and a pain level of 0 following the procedure. Post procedure Diagnosis Wound #6: Same as Pre-Procedure General Notes: [name]. Plan Wound Cleansing: Wound #6 Left Achilles: Clean wound with Normal Saline. Anesthetic: Wound #6 Left Achilles: Topical Lidocaine 4% cream [name] to wound bed prior to debridement Skin Barriers/Peri-Wound Care: JOZI, KREKE ([id number]) Wound #6 Left Achilles: Skin Prep Primary Wound Dressing: Wound #6 Left Achilles: Other: - [name] antimicrobial wound matrix Secondary Dressing: Wound #6 Left Achilles: Gauze and Kerlix/Conform Dressing Change Frequency: Wound #6 Left Achilles: Change dressing every week Follow-up Appointments: Wound #6 Left Achilles: Return Appointment in 1 week. With the usual precautions, [name] was [name] and  bolstered in place after applying Steri-Strips. We will see her back next week. She will have weekly applications of the material after sharply debriding the  wound as required. Electronic Signature(s) Signed: 05/04/2015 10:02:12 AM By: [name] Fudge [name], FACS Entered By: [name] on 05/04/2015 10:02:12 [name] ([id number]) -------------------------------------------------------------------------------- SuperBill Details Patient Name: [name] Date of Service: 05/04/2015 Medical Record Number: [id number] Patient Account Number: [id number] Date of Birth/Sex: 1928-07-16 (81 y.o. 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 of foot Facility Procedures CPT4: Description Modifier Quantity Code [id number] - SKIN SUB GRAFT FACE/NK/HF/G 1 ICD-10 Description Diagnosis 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 of native arteries of right leg with ulceration of other part of foot Physician Procedures CPT4: Description Modifier Quantity Code [id number] - WC PHYS SKIN SUB GRAFT FACE/NK/HF/G 1 ICD-10 Description Diagnosis L97.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 I70.244 Atherosclerosis of native arteries of  left leg with ulceration of heel and midfoot I70.235 Atherosclerosis of native arteries of right leg with ulceration of other  part of foot Electronic Signature(s) Signed: 05/04/2015 10:02:25 AM By: [name] Fudge [name], FACS Entered By: [name] on 05/04/2015 10:02:25

## 2015-05-05 NOTE — Progress Notes (Signed)
BRYLEIGH, BAUWENS ([id number]) Visit Report for 05/04/2015 Arrival Information Details Patient Name: [name], [name] Date of Service: 05/04/2015 9:30 AM Medical Record Number: [id number] Patient Account Number: [id number] Date of Birth/Sex: 1927-12-14 (81 y.o. Female) Treating RN: Afful, RN, BSN, Velva Harman Primary Care Physician: Constance Goltz Other Clinician: Referring Physician: Constance Goltz Treating Physician/Extender: Frann Rider in Treatment: 39 Visit Information History Since Last Visit Any new allergies or adverse reactions: No Patient Arrived: Wheel Chair Had a fall or experienced change in No Arrival Time: 09:17 activities of daily living that may affect Accompanied By: self with driver at risk of falls: lobby Signs or symptoms of abuse/neglect since last No Transfer Assistance: None visito Patient Identification Verified: Yes Hospitalized since last visit: No Secondary Verification Process Yes Has Dressing in Place as Prescribed: Yes Completed: Pain Present Now: No Patient Requires Transmission- No Based Precautions: Patient Has Alerts: Yes Patient Alerts: Patient on Blood Thinner Electronic Signature(s) Signed: 05/04/2015 4:40:49 PM By: Regan Lemming BSN, RN Entered By: Regan Lemming on 05/04/2015 09:19:24 [name] ([id number]) -------------------------------------------------------------------------------- Encounter Discharge Information Details Patient Name: [name] Date of Service: 05/04/2015 9:30 AM Medical Record Number: [id number] Patient Account Number: [id number] Date of Birth/Sex: 1927-10-27 (81 y.o. Female) Treating RN: Baruch Gouty, RN, BSN, Velva Harman Primary Care Physician: Constance Goltz Other Clinician: Referring Physician: Constance Goltz Treating Physician/Extender: Frann Rider in Treatment: 68 Encounter Discharge Information Items Discharge Pain Level: 0 Discharge Condition: Stable Ambulatory Status: Wheelchair Discharge Destination:  Nursing Home Transportation: Other driver from Accompanied By: facility Schedule Follow-up Appointment: No Medication Reconciliation completed and provided to Patient/Care No Provider: Provided on Clinical Summary of Care: 05/04/2015 Form Type Recipient Paper Patient DJ Electronic Signature(s) Signed: 05/04/2015 4:40:49 PM By: Regan Lemming BSN, RN Previous Signature: 05/04/2015 9:49:41 AM Version By: Ruthine Dose Entered By: Regan Lemming on 05/04/2015 09:57:50 Avalon, Rosmery ([id number]) -------------------------------------------------------------------------------- Lower Extremity Assessment Details Patient Name: [name] Date of Service: 05/04/2015 9:30 AM Medical Record Number: [id number] Patient Account Number: [id number] Date of Birth/Sex: [birth year] (81 y.o. Female) Treating RN: Afful, RN, BSN, Velva Harman Primary Care Physician: Constance Goltz Other Clinician: Referring Physician: Constance Goltz Treating Physician/Extender: Frann Rider in Treatment: 51 Vascular Assessment Pulses: Posterior Tibial Dorsalis Pedis Palpable: [Left:No] Doppler: [Left:Monophasic] Extremity colors, hair growth, and conditions: Extremity Color: [Left:Normal] Hair Growth on Extremity: [Left:Yes] Temperature of Extremity: [Left:Warm] Capillary Refill: [Left:< 3 seconds] Toe Nail Assessment Left: Right: Thick: Yes Discolored: Yes Deformed: No Improper Length and Hygiene: No Electronic Signature(s) Signed: 05/04/2015 4:40:49 PM By: Regan Lemming BSN, RN Entered By: Regan Lemming on 05/04/2015 09:23:18 [name] ([id number]) -------------------------------------------------------------------------------- Multi Wound Chart Details Patient Name: [name] Date of Service: 05/04/2015 9:30 AM Medical Record Number: [id number] Patient Account Number: [id number] Date of Birth/Sex: Mar 10, 1928 (81 y.o. 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 N/A] Comorbid History: [6:Glaucoma, Asthma, Hypertension, Peripheral Venous Disease, Type II Diabetes] [N/A:N/A] Date Acquired: [6:09/26/2014] [N/A:N/A] Weeks of Treatment: [6:30] [N/A:N/A] Wound Status: [6:Open] [N/A:N/A] Measurements L x W x D 1x1x0.1 [N/A:N/A] (cm) Area (cm) : [6:0.785] [N/A:N/A] Volume (cm) : [6:0.079] [N/A:N/A] % Reduction in Area: [6:79.20%] [N/A:N/A] % Reduction in Volume: 95.80% [N/A:N/A] Classification: [6:Partial Thickness] [N/A:N/A]  HBO Classification: [6:Grade 1] [N/A:N/A] Exudate Amount: [6:Small] [N/A:N/A] Exudate Type: [6:Serous] [N/A:N/A] Exudate Color: [6:amber] [N/A:N/A] Wound Margin: [6:Distinct, outline attached N/A] Granulation Amount: [6:None Present (0%)] [N/A:N/A] Necrotic Amount: [6:Large (67-100%)] [N/A:N/A] Exposed Structures: [6:Fascia: No Fat: No Tendon: No Muscle: No Joint: No] [N/A:N/A] Bone: No Limited to Skin Breakdown Epithelialization: Medium (34-66%) N/A N/A Periwound Skin Texture: Edema: Yes N/A N/A Excoriation: No Induration: No Callus: No Crepitus: No Fluctuance: No Friable: No Rash: No Scarring: No Periwound Skin Moist: Yes N/A N/A Moisture: Maceration: No Dry/Scaly: No Periwound Skin Color: Hemosiderin Staining: Yes N/A N/A Atrophie Blanche: No Cyanosis: No Ecchymosis: No Erythema: No Mottled: No Pallor: No Rubor: No Temperature: No Abnormality N/A N/A Tenderness on No N/A N/A Palpation: Wound Preparation: Ulcer Cleansing: N/A  N/A Rinsed/Irrigated with Saline Topical Anesthetic Applied: Other: lidocaine 4% Treatment Notes Electronic Signature(s) Signed: 05/04/2015 4:40:49 PM By: Regan Lemming BSN, RN Entered By: Regan Lemming on 05/04/2015 09:26:17 LAKIA, RUMRILL ([id number]) -------------------------------------------------------------------------------- [place] Details Patient Name: [name] Date of Service: 05/04/2015 9:30 AM Medical Record Number: [id number] Patient Account Number: [id number] Date of Birth/Sex: Aug 24, 1927 (81 y.o. Female) Treating RN: Afful, RN, BSN, Velva Harman Primary Care Physician: Constance Goltz Other Clinician: Referring Physician: Constance Goltz Treating Physician/Extender: Frann Rider in Treatment: 46 Active Inactive Abuse / Safety / Falls / Self Care Management Nursing Diagnoses: Impaired physical mobility Potential for falls Goals: Patient will remain injury free Date Initiated: 09/12/2014 Goal Status: Active Patient/caregiver will verbalize understanding of skin care regimen Date Initiated: 09/12/2014 Goal Status: Active Patient/caregiver will verbalize/demonstrate measures taken to prevent injury and/or falls Date Initiated: 09/12/2014 Goal Status: Active Patient/caregiver will verbalize/demonstrate understanding of what to do in case of emergency Date Initiated: 09/12/2014 Goal Status: Active Interventions: Assess fall risk on admission and as needed Assess: immobility, friction, shearing, incontinence upon admission and as needed Assess impairment of mobility on admission and as needed per policy Provide education on basic hygiene Provide education on fall prevention Provide education on personal and home safety Provide education on safe transfers Treatment Activities: Education provided on Basic Hygiene : 10/12/2014 Notes: Necrotic Tissue MARISELA, SHARBAUGH ([id number]) Nursing Diagnoses: Impaired tissue integrity related to  necrotic/devitalized tissue Knowledge deficit related to management of necrotic/devitalized tissue Goals: Necrotic/devitalized tissue will be minimized in the wound bed Date Initiated: 09/12/2014 Goal Status: Active Patient/caregiver will verbalize understanding of reason and process for debridement of necrotic tissue Date Initiated: 09/12/2014 Goal Status: Active Interventions: Assess patient pain level pre-, during and post procedure and prior to discharge Provide education on necrotic tissue and debridement process Treatment Activities: Apply topical anesthetic as ordered : 05/04/2015 Enzymatic debridement : 05/04/2015 Excisional debridement : 05/04/2015 Notes: Orientation to the Wound Care Program Nursing Diagnoses: Knowledge deficit related to the wound healing center program Goals: Patient/caregiver will verbalize understanding of the [facility] Date Initiated: 09/12/2014 Goal Status: Active Interventions: Provide education on orientation to the wound center Notes: Pressure Nursing Diagnoses: Knowledge deficit related to causes and risk factors for pressure ulcer development Knowledge deficit related to management of pressures ulcers Potential for impaired tissue integrity related to pressure, friction, moisture, and shear GoalsBILLYJO, MCCUSKEY ([id number]) Patient will remain free from development of additional pressure ulcers Date Initiated: 09/12/2014 Goal Status: Active Patient will remain free of pressure ulcers Date Initiated: 09/12/2014 Goal Status: Active Patient/caregiver will verbalize risk factors for pressure ulcer development Date Initiated: 09/12/2014 Goal Status: Active Patient/caregiver will verbalize understanding of pressure ulcer management Date Initiated: 09/12/2014 Goal Status: Active  Interventions: Assess: immobility, friction, shearing, incontinence upon admission and as needed Assess offloading mechanisms upon admission and as  needed Assess potential for pressure ulcer upon admission and as needed Provide education on pressure ulcers Treatment Activities: Pressure reduction/relief device ordered : 05/04/2015 Notes: Wound/Skin Impairment Nursing Diagnoses: Impaired tissue integrity Knowledge deficit related to ulceration/compromised skin integrity Goals: Patient/caregiver will verbalize understanding of skin care regimen Date Initiated: 09/12/2014 Goal Status: Active Ulcer/skin breakdown will heal within 14 weeks Date Initiated: 09/12/2014 Goal Status: Active Interventions: Assess patient/caregiver ability to obtain necessary supplies Assess patient/caregiver ability to perform ulcer/skin care regimen upon admission and as needed Assess ulceration(s) every visit Provide education on ulcer and skin care Treatment Activities: Skin care regimen initiated : 05/04/2015 REXENE, VANESSEN ([id number]) Topical wound management initiated : 05/04/2015 Notes: Electronic Signature(s) Signed: 05/04/2015 4:40:49 PM By: Regan Lemming BSN, RN Entered By: Regan Lemming on 05/04/2015 09:26:10 [name] ([id number]) -------------------------------------------------------------------------------- Pain Assessment Details Patient Name: [name] Date of Service: 05/04/2015 9:30 AM Medical Record Number: [id number] Patient Account Number: [id number] Date of Birth/Sex: [birth year] (81 y.o. Female) Treating RN: Baruch Gouty, RN, BSN, Velva Harman Primary Care Physician: Constance Goltz Other Clinician: Referring Physician: Constance Goltz Treating Physician/Extender: Frann Rider in Treatment: 66 Active Problems Location of Pain Severity and Description of Pain Patient Has Paino No Site Locations Pain Management and Medication Current Pain Management: Electronic Signature(s) Signed: 05/04/2015 4:40:49 PM By: Regan Lemming BSN, RN Entered By: Regan Lemming on 05/04/2015 09:21:53 [name]  ([id number]) -------------------------------------------------------------------------------- Patient/Caregiver Education Details Patient Name: [name] Date of Service: 05/04/2015 9:30 AM Medical Record Number: [id number] Patient Account Number: [id number] Date of Birth/Gender: 1928-01-27 (81 y.o. Female) Treating RN: Afful, RN, BSN, Velva Harman Primary Care Physician: Constance Goltz Other Clinician: Referring Physician: Constance Goltz Treating Physician/Extender: Frann Rider in Treatment: 35 Education Assessment Education Provided To: Patient Education Topics Provided Basic Hygiene: Methods: Explain/Verbal Responses: State content correctly Pressure: Methods: Explain/Verbal Responses: State content correctly Safety: Methods: Explain/Verbal Responses: State content correctly Welcome To [facility]: Methods: Explain/Verbal Responses: State content correctly Wound Debridement: Methods: Explain/Verbal Responses: State content correctly Wound/Skin Impairment: Methods: Explain/Verbal Responses: State content correctly Electronic Signature(s) Signed: 05/04/2015 4:40:49 PM By: Regan Lemming BSN, RN Entered By: Regan Lemming on 05/04/2015 09:58:18 [name] ([id number]) -------------------------------------------------------------------------------- Wound Assessment Details Patient Name: [name] Date of Service: 05/04/2015 9:30 AM Medical Record Number: [id number] Patient Account Number: [id number] Date of Birth/Sex: Dec 23, 1927 (81 y.o. Female) Treating RN: Afful, RN, BSN, Velva Harman Primary Care Physician: Constance Goltz Other Clinician: Referring Physician: Constance Goltz Treating Physician/Extender: Frann Rider in Treatment: 33 Wound Status Wound Number: 6 Primary Arterial Insufficiency Ulcer Etiology: Wound Location: Left Achilles Wound Open Wounding Event: Gradually Appeared Status: Date Acquired: 09/26/2014 Comorbid Glaucoma, Asthma,  Hypertension, Weeks Of Treatment: 30 History: Peripheral Venous Disease, Type II Clustered Wound: No Diabetes Photos Photo Uploaded By: Regan Lemming on 05/04/2015 15:15:05 Wound Measurements Length: (cm) 1 Width: (cm) 1 Depth: (cm) 0.1 Area: (cm) 0.785 Volume: (cm) 0.079 % Reduction in Area: 79.2% % Reduction in Volume: 95.8% Epithelialization: Medium (34-66%) Tunneling: No Undermining: No Wound Description Classification: Partial Thickness Foul O Diabetic Severity (Wagner): Grade 1 Wound Margin: Distinct, outline attached Exudate Amount: Small Exudate Type: Serous Exudate Color: amber dor After Cleansing: No Wound Bed Granulation Amount: None Present (0%) Exposed Structure Necrotic Amount: Large (67-100%) Fascia Exposed: No Necrotic Quality: Adherent Slough Fat Layer Exposed: No [place], Pinkey ([id number]) Tendon Exposed: No Muscle Exposed: No Joint Exposed: No Bone Exposed: No Limited  to Skin Breakdown Periwound Skin Texture Texture Color No Abnormalities Noted: No No Abnormalities Noted: No Callus: No Atrophie Blanche: No Crepitus: No Cyanosis: No Excoriation: No Ecchymosis: No Fluctuance: No Erythema: No Friable: No Hemosiderin Staining: Yes Induration: No Mottled: No Localized Edema: Yes Pallor: No Rash: No Rubor: No Scarring: No Temperature / Pain Moisture Temperature: No Abnormality No Abnormalities Noted: No Dry / Scaly: No Maceration: No Moist: Yes Wound Preparation Ulcer Cleansing: Rinsed/Irrigated with Saline Topical Anesthetic Applied: Other: lidocaine 4%, Treatment Notes Wound #6 (Left Achilles) 1. Cleansed with: Clean wound with Normal Saline 3. Peri-wound Care: Skin Prep 4. Dressing Applied: Other dressing (specify in notes) 5. Secondary Dressing Applied Gauze and Kerlix/Conform 7. Secured with Tape Notes Puraply applied Electronic Signature(s) Signed: 05/04/2015 4:40:49 PM By: Regan Lemming BSN, RN Entered By: Regan Lemming on 05/04/2015 09:25:55 RIYAAN, SCHWABE ([id number]) [place], Blakley ([id number]) -------------------------------------------------------------------------------- Vitals Details Patient Name: [name] Date of Service: 05/04/2015 9:30 AM Medical Record Number: [id number] Patient Account Number: [id number] Date of Birth/Sex: 02-18-1928 (81 y.o. Female) Treating RN: Afful, RN, BSN, Velva Harman Primary Care Physician: Constance Goltz Other Clinician: Referring Physician: Constance Goltz Treating Physician/Extender: Frann Rider in Treatment: 59 Vital Signs Time Taken: 09:21 Temperature (F): 98.3 Height (in): 64 Pulse (bpm): 70 Weight (lbs): 180 Respiratory Rate (breaths/min): 16 Body Mass Index (BMI): 30.9 Blood Pressure (mmHg): 162/62 Reference Range: 80 - 120 mg / dl Electronic Signature(s) Signed: 05/04/2015 4:40:49 PM By: Regan Lemming BSN, RN Entered By: Regan Lemming on 05/04/2015 09:22:35

## 2015-05-10 ENCOUNTER — Encounter: Payer: Medicare Other | Admitting: Surgery

## 2015-05-10 DIAGNOSIS — L97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: Secondary | ICD-10-CM | POA: Diagnosis not present

## 2015-05-10 NOTE — Progress Notes (Signed)
[name] ([id number]) Visit Report for 05/10/2015 Arrival Information Details Patient Name: [name] Date of Service: 05/10/2015 8:00 AM Medical Record Number: [id number] Patient Account Number: [id number] Date of Birth/Sex: March 07, 1928 (81 y.[name]. Female) Treating RN: Afful, RN, BSN, Velva Harman Primary Care Physician: Constance Goltz Other Clinician: Referring Physician: Constance Goltz Treating Physician/Extender: Frann Rider in Treatment: 42 Visit Information History Since Last Visit Added or deleted any medications: No Patient Arrived: Walker Any new allergies or adverse reactions: No Arrival Time: 08:10 Had a fall or experienced change in No Accompanied By: self activities of daily living that may affect Transfer Assistance: None risk of falls: Patient Identification Verified: Yes Signs or symptoms of abuse/neglect since last No Secondary Verification Process Yes visito Completed: Hospitalized since last visit: No Patient Requires Transmission- No Has Dressing in Place as Prescribed: Yes Based Precautions: Pain Present Now: No Patient Has Alerts: Yes Patient Alerts: Patient on Blood Thinner Electronic Signature(s) Signed: 05/10/2015 8:13:15 AM By: Regan Lemming BSN, RN Entered By: Regan Lemming on 05/10/2015 08:13:15 [name] ([id number]) -------------------------------------------------------------------------------- Encounter Discharge Information Details Patient Name: [name] Date of Service: 05/10/2015 8:00 AM Medical Record Number: [id number] Patient Account Number: [id number] Date of Birth/Sex: Aug 16, 1928 (81 y.[name]. Female) Treating RN: Afful, RN, BSN, Velva Harman Primary Care Physician: Constance Goltz Other Clinician: Referring Physician: Constance Goltz Treating Physician/Extender: Frann Rider in Treatment: 68 Encounter Discharge Information Items Discharge Pain Level: 0 Discharge Condition: Stable Ambulatory Status: Wheelchair Discharge  Destination: Nursing Home Transportation: Other Accompanied By: driver Schedule Follow-up Appointment: No Medication Reconciliation completed and provided to Patient/Care No Provider: Provided on Clinical Summary of Care: 05/10/2015 Form Type Recipient Paper Patient DJ Electronic Signature(s) Signed: 05/10/2015 8:48:03 AM By: Regan Lemming BSN, RN Previous Signature: 05/10/2015 8:43:39 AM Version By: Ruthine Dose Entered By: Regan Lemming on 05/10/2015 08:48:03 [name] ([id number]) -------------------------------------------------------------------------------- Lower Extremity Assessment Details Patient Name: [name] Date of Service: 05/10/2015 8:00 AM Medical Record Number: [id number] Patient Account Number: [id number] Date of Birth/Sex: 11-05-1927 (81 y.[name]. Female) Treating RN: Afful, RN, BSN, Velva Harman Primary Care Physician: Constance Goltz Other Clinician: Referring Physician: Constance Goltz Treating Physician/Extender: Frann Rider in Treatment: 44 Vascular Assessment Pulses: Posterior Tibial Dorsalis Pedis Palpable: [Left:No] Doppler: [Left:Monophasic] Extremity colors, hair growth, and conditions: Extremity Color: [Left:Normal] Hair Growth on Extremity: [Left:No] Temperature of Extremity: [Left:Warm] Capillary Refill: [Left:< 3 seconds] Toe Nail Assessment Left: Right: Thick: Yes Discolored: Yes Deformed: No Improper Length and Hygiene: No Electronic Signature(s) Signed: 05/10/2015 8:15:58 AM By: Regan Lemming BSN, RN Entered By: Regan Lemming on 05/10/2015 08:15:58 [name] ([id number]) -------------------------------------------------------------------------------- Multi Wound Chart Details Patient Name: [name] Date of Service: 05/10/2015 8:00 AM Medical Record Number: [id number] Patient Account Number: [id number] Date of Birth/Sex: 10-15-1927 (81 y.[name]. 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/A] Comorbid History: [6:Glaucoma, Asthma, Hypertension, Peripheral Venous Disease, Type II Diabetes] [N/A:N/A] Date Acquired: [6:09/26/2014] [N/A:N/A] Weeks of Treatment: [6:31] [N/A:N/A] Wound Status: [6:Open] [N/A:N/A] Measurements L x W x D 1x1x0.[name] [N/A:N/A] (cm) Area (cm) : [6:0.785] [N/A:N/A] Volume (cm) : [6:0.079] [N/A:N/A] % Reduction in Area: [6:79.20%] [N/A:N/A] % Reduction in Volume: 95.80% [N/A:N/A] Classification: [6:Partial Thickness] [N/A:N/A] HBO  Classification: [6:Grade [name]] [N/A:N/A] Exudate Amount: [6:Small] [N/A:N/A] Exudate Type: [6:Serous] [N/A:N/A] Exudate Color: [6:amber] [N/A:N/A] Wound Margin: [6:Distinct, outline attached N/A] Granulation Amount: [6:Medium (34-66%)] [N/A:N/A] Necrotic Amount: [6:Small ([name]-33%)] [N/A:N/A] Exposed Structures: [6:Fascia: No Fat: No Tendon: No Muscle: No Joint: No] [N/A:N/A] Bone: No Limited to Skin Breakdown Epithelialization: Medium (34-66%) N/A N/A Periwound Skin Texture: Edema: Yes N/A N/A Excoriation: No Induration: No Callus: No Crepitus: No Fluctuance: No Friable: No Rash: No Scarring: No Periwound Skin Moist: Yes N/A N/A Moisture: Maceration: No Dry/Scaly: No Periwound Skin Color: Hemosiderin Staining: Yes N/A N/A Atrophie Blanche: No Cyanosis: No Ecchymosis: No Erythema: No Mottled: No Pallor: No Rubor: No Temperature: No Abnormality N/A N/A Tenderness on No N/A N/A Palpation: Wound Preparation: Ulcer Cleansing: N/A  N/A Rinsed/Irrigated with Saline Topical Anesthetic Applied: Other: lidocaine 4% Treatment Notes Electronic Signature(s) Signed: 05/10/2015 8:44:34 AM By: Regan Lemming BSN, RN Entered By: Regan Lemming on 05/10/2015 08:44:34 [name], [name] ([id number]) -------------------------------------------------------------------------------- [place] Details Patient Name: [name] Date of Service: 05/10/2015 8:00 AM Medical Record Number: [id number] Patient Account Number: [id number] Date of Birth/Sex: 1927/10/13 (81 y.[name]. Female) Treating RN: Afful, RN, BSN, Velva Harman Primary Care Physician: Constance Goltz Other Clinician: Referring Physician: Constance Goltz Treating Physician/Extender: Frann Rider in Treatment: 77 Active Inactive Abuse / Safety / Falls / Self Care Management Nursing Diagnoses: Impaired physical mobility Potential for falls Goals: Patient will remain injury free Date Initiated: [name]/26/2016 Goal Status: Active Patient/caregiver will verbalize understanding of skin care regimen Date Initiated: [name]/26/2016 Goal Status: Active Patient/caregiver will verbalize/demonstrate measures taken to prevent injury and/or falls Date Initiated: [name]/26/2016 Goal Status: Active Patient/caregiver will verbalize/demonstrate understanding of what to do in case of emergency Date Initiated: [name]/26/2016 Goal Status: Active Interventions: Assess fall risk on admission and as needed Assess: immobility, friction, shearing, incontinence upon admission and as needed Assess impairment of mobility on admission and as needed per policy Provide education on basic hygiene Provide education on fall prevention Provide education on personal and home safety Provide education on safe transfers Treatment Activities: Education provided on Basic Hygiene : 10/12/2014 Notes: Necrotic Tissue [name], [name] ([id number]) Nursing Diagnoses: Impaired tissue integrity related to  necrotic/devitalized tissue Knowledge deficit related to management of necrotic/devitalized tissue Goals: Necrotic/devitalized tissue will be minimized in the wound bed Date Initiated: [name]/26/2016 Goal Status: Active Patient/caregiver will verbalize understanding of reason and process for debridement of necrotic tissue Date Initiated: [name]/26/2016 Goal Status: Active Interventions: Assess patient pain level pre-, during and post procedure and prior to discharge Provide education on necrotic tissue and debridement process Treatment Activities: Apply topical anesthetic as ordered : 05/10/2015 Enzymatic debridement : 05/10/2015 Excisional debridement : 05/10/2015 Notes: Orientation to the Wound Care Program Nursing Diagnoses: Knowledge deficit related to the wound healing center program Goals: Patient/caregiver will verbalize understanding of the [facility] Date Initiated: [name]/26/2016 Goal Status: Active Interventions: Provide education on orientation to the wound center Notes: Pressure Nursing Diagnoses: Knowledge deficit related to causes and risk factors for pressure ulcer development Knowledge deficit related to management of pressures ulcers Potential for impaired tissue integrity related to pressure, friction, moisture, and shear GoalsVERNIQUE, [name] ([id number]) Patient will remain free from development of additional pressure ulcers Date Initiated: [name]/26/2016 Goal Status: Active Patient will remain free of pressure ulcers Date Initiated: [name]/26/2016 Goal Status: Active Patient/caregiver will verbalize risk factors for pressure ulcer development Date Initiated: [name]/26/2016 Goal Status: Active Patient/caregiver will verbalize understanding of pressure ulcer management Date Initiated: [name]/26/2016 Goal Status: Active Interventions: Assess:  immobility, friction, shearing, incontinence upon admission and as needed Assess offloading mechanisms upon admission and as  needed Assess potential for pressure ulcer upon admission and as needed Provide education on pressure ulcers Treatment Activities: Pressure reduction/relief device ordered : 05/10/2015 Notes: Wound/Skin Impairment Nursing Diagnoses: Impaired tissue integrity Knowledge deficit related to ulceration/compromised skin integrity Goals: Patient/caregiver will verbalize understanding of skin care regimen Date Initiated: [name]/26/2016 Goal Status: Active Ulcer/skin breakdown will heal within 14 weeks Date Initiated: [name]/26/2016 Goal Status: Active Interventions: Assess patient/caregiver ability to obtain necessary supplies Assess patient/caregiver ability to perform ulcer/skin care regimen upon admission and as needed Assess ulceration(s) every visit Provide education on ulcer and skin care Treatment Activities: Skin care regimen initiated : 05/10/2015 [name], [name] ([id number]) Topical wound management initiated : 05/10/2015 Notes: Electronic Signature(s) Signed: 05/10/2015 8:44:27 AM By: Regan Lemming BSN, RN Entered By: Regan Lemming on 05/10/2015 08:44:27 [name] ([id number]) -------------------------------------------------------------------------------- Pain Assessment Details Patient Name: [name] Date of Service: 05/10/2015 8:00 AM Medical Record Number: [id number] Patient Account Number: [id number] Date of Birth/Sex: 1928-03-25 (81 y.[name]. Female) Treating RN: Baruch Gouty, RN, BSN, Velva Harman Primary Care Physician: Constance Goltz Other Clinician: Referring Physician: Constance Goltz Treating Physician/Extender: Frann Rider in Treatment: 33 Active Problems Location of Pain Severity and Description of Pain Patient Has Paino No Site Locations Pain Management and Medication Current Pain Management: Electronic Signature(s) Signed: 05/10/2015 8:13:23 AM By: Regan Lemming BSN, RN Entered By: Regan Lemming on 05/10/2015 08:13:23 [name]  ([id number]) -------------------------------------------------------------------------------- Patient/Caregiver Education Details Patient Name: [name] Date of Service: 05/10/2015 8:00 AM Medical Record Number: [id number] Patient Account Number: [id number] Date of Birth/Gender: [birth year] (81 y.[name]. Female) Treating RN: Afful, RN, BSN, Velva Harman Primary Care Physician: Constance Goltz Other Clinician: Referring Physician: Constance Goltz Treating Physician/Extender: Frann Rider in Treatment: 34 Education Assessment Education Provided To: Patient Education Topics Provided Basic Hygiene: Methods: Explain/Verbal Responses: State content correctly Pressure: Methods: Explain/Verbal Responses: State content correctly Safety: Methods: Explain/Verbal Responses: State content correctly Welcome To [facility] [place]: Methods: Explain/Verbal Responses: State content correctly Wound Debridement: Methods: Explain/Verbal Responses: State content correctly Wound/Skin Impairment: Methods: Explain/Verbal Responses: State content correctly Electronic Signature(s) Signed: 05/10/2015 8:48:32 AM By: Regan Lemming BSN, RN Entered By: Regan Lemming on 05/10/2015 08:48:32 [name] ([id number]) -------------------------------------------------------------------------------- Wound Assessment Details Patient Name: [name] Date of Service: 05/10/2015 8:00 AM Medical Record Number: [id number] Patient Account Number: [id number] Date of Birth/Sex: 07/12/1928 (81 y.[name]. Female) Treating RN: Afful, RN, BSN, Velva Harman Primary Care Physician: Constance Goltz Other Clinician: Referring Physician: Constance Goltz Treating Physician/Extender: Frann Rider in Treatment: 34 Wound Status Wound Number: 6 Primary Arterial Insufficiency Ulcer Etiology: Wound Location: Left Achilles Wound Open Wounding Event: Gradually Appeared Status: Date Acquired: 09/26/2014 Comorbid Glaucoma, Asthma,  Hypertension, Weeks Of Treatment: 31 History: Peripheral Venous Disease, Type II Clustered Wound: No Diabetes Photos Photo Uploaded By: Regan Lemming on 05/10/2015 15:31:03 Wound Measurements Length: (cm) [name] Width: (cm) [name] Depth: (cm) 0.[name] Area: (cm) 0.785 Volume: (cm) 0.079 % Reduction in Area: 79.2% % Reduction in Volume: 95.8% Epithelialization: Medium (34-66%) Tunneling: No Undermining: No Wound Description Classification: Partial Thickness Foul [name] Wound Margin: Distinct, outline attached Exudate Amount: Small Exudate Type: Serous Exudate Color: amber dor After Cleansing: No Wound Bed Granulation Amount: Medium (34-66%) Exposed Structure Necrotic Amount: Small ([name]-33%) Fascia Exposed: No Necrotic Quality: Adherent Slough Fat Layer Exposed: No [place], [facility] ([id number]) Tendon Exposed: No Muscle Exposed: No Joint Exposed: No Bone Exposed: No Limited to Skin Breakdown  Periwound Skin Texture Texture Color No Abnormalities Noted: No No Abnormalities Noted: No Callus: No Atrophie Blanche: No Crepitus: No Cyanosis: No Excoriation: No Ecchymosis: No Fluctuance: No Erythema: No Friable: No Hemosiderin Staining: Yes Induration: No Mottled: No Localized Edema: Yes Pallor: No Rash: No Rubor: No Scarring: No Temperature / Pain Moisture Temperature: No Abnormality No Abnormalities Noted: No Dry / Scaly: No Maceration: No Moist: Yes Wound Preparation Ulcer Cleansing: Rinsed/Irrigated with Saline Topical Anesthetic Applied: Other: lidocaine 4%, Treatment Notes Wound #6 (Left Achilles) 5. Secondary Dressing Applied Gauze and Kerlix/Conform 7. Secured with Tape Notes Puraply applied Electronic Signature(s) Signed: 05/10/2015 8:44:17 AM By: Regan Lemming BSN, RN Entered By: Regan Lemming on 05/10/2015 08:44:17 [name]  ([id number]) -------------------------------------------------------------------------------- Vitals Details Patient Name: [name] Date of Service: 05/10/2015 8:00 AM Medical Record Number: [id number] Patient Account Number: [id number] Date of Birth/Sex: September 26, 1927 (81 y.[name]. Female) Treating RN: Afful, RN, BSN, Velva Harman Primary Care Physician: Constance Goltz Other Clinician: Referring Physician: Constance Goltz Treating Physician/Extender: Frann Rider in Treatment: 34 Vital Signs Time Taken: 08:13 Temperature (F): 98.2 Height (in): 64 Pulse (bpm): 72 Weight (lbs): 180 Respiratory Rate (breaths/min): 16 Body Mass Index (BMI): 30.9 Blood Pressure (mmHg): 130/75 Reference Range: 80 - 120 mg / dl Electronic Signature(s) Signed: 05/10/2015 8:14:12 AM By: Regan Lemming BSN, RN Entered By: Regan Lemming on 05/10/2015 08:14:12

## 2015-05-11 NOTE — Progress Notes (Addendum)
[name] ([id number]) Visit Report for 05/10/2015 Chief Complaint Document Details Patient Name: [name] Date of Service: 05/10/2015 8:00 AM Medical Record Number: [id number] Patient Account Number: [id number] Date of Birth/Sex: 12-30-1927 (81 y.o. Female) Treating RN: Afful, RN, BSN, Velva Harman Primary Care Physician: SYSTEM, PCP Other Clinician: Referring Physician: Constance Goltz Treating Physician/Extender: Frann Rider in Treatment: 33 Information Obtained from: Patient Chief Complaint Patient presents to the wound care center today with an open arterial ulcer on the left foot big toe and lateral heal. She also has some superficial ulcerations on the second and third toe dorsum. she has no family member with her today and she is a very poor historian and cannot give a proper history. As stated that she walks around with a walker. Electronic Signature(s) Signed: 05/10/2015 9:08:52 AM By: Christin Fudge MD, FACS Entered By: Christin Fudge on 05/10/2015 09:08:52 [name] ([id number]) -------------------------------------------------------------------------------- Cellular or Tissue Based Product Details Patient Name: [name] Date of Service: 05/10/2015 8:00 AM Medical Record Number: [id number] Patient Account Number: [id number] Date of Birth/Sex: 1927-09-13 (81 y.o. Female) Treating RN: Afful, RN, BSN, Velva Harman Primary Care Physician: SYSTEM, PCP Other Clinician: Referring Physician: Constance Goltz Treating Physician/Extender: Frann Rider in Treatment: 34 Cellular or Tissue Based Wound #6 Left Achilles Product Type Applied to: Performed By: Physician Pat Patrick., MD Cellular or Tissue Based Other Product Type: Time-Out Taken: Yes Location: genitalia / hands / feet / multiple digits Wound Size (sq cm): 1 Product Size (sq cm): 54 Waste Size (sq cm): 53 Amount of Product Applied (sq cm): 1 Lot #: [id number] Order #: [id number] Expiration Date:  09/06/2015 Fenestrated: No Reconstituted: No Secured: Yes Secured With: Steri-Strips Dressing Applied: Yes Primary Dressing: mepitel Procedural Pain: 0 Post Procedural Pain: 0 Response to Treatment: Procedure was tolerated well Post Procedure Diagnosis Same as Pre-procedure Electronic Signature(s) Signed: 05/15/2015 8:07:26 AM By: Regan Lemming BSN, RN Previous Signature: 05/10/2015 9:08:46 AM Version By: Christin Fudge MD, FACS Previous Signature: 05/10/2015 8:47:04 AM Version By: Regan Lemming BSN, RN Entered By: Regan Lemming on 05/15/2015 08:07:26 [name] ([id number]) -------------------------------------------------------------------------------- HPI Details Patient Name: [name] Date of Service: 05/10/2015 8:00 AM Medical Record Number: [id number] Patient Account Number: [id number] Date of Birth/Sex: 1928-04-01 (81 y.o. Female) Treating RN: Baruch Gouty, RN, BSN, Velva Harman Primary Care Physician: SYSTEM, PCP Other Clinician: Referring Physician: Constance Goltz Treating Physician/Extender: Frann Rider in Treatment: 21 History of Present Illness Location: left heel Quality: Patient reports experiencing a dull pain to affected area(s). Severity: Patient states wound are getting worse. Duration: Patient states that they are not certain how long the wound has been present. Timing: Pain in wound is Intermittent (comes and goes Context: The wound appeared gradually over time Associated Signs and Symptoms: Patient reports having difficulty standing for long periods. HPI Description: This 81 year old patient is a poor historian and comes from a nursing home. Does not have any family members with her. Says she has a ulcer on the lateral part of her left foot and some new ones are there on her right foot too. She is unable to say how long she's had these. she does walk with a walker and this is limited most of the day. Does not recall if she has had any vascular studies done. I have  reviewed an x-ray done of the left foot which basically shows osteoporosis but no evidence of fracture dislocation or osteomyelitis. 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 She seems to be doing better overall. 10/19/14 -- I understand she has gone to the vascular surgery and lab yesterday and has had all her tests done yesterday. She is doing fine otherwise and has had no fresh issues. 10/26/2014 she seems to be doing well and has no fresh problems and seems much brighter. 10/12/14 -- Taking her antibiotic and continues to do her dressings locally and she is helped by her skilled nursing. She seems to be doing fine and has no fresh issues. 10/19/14 -- she is doing fine and has had no fresh issues. She says her son to go for her vascular workup yesterday and she has been called back after 3 months. we will try and gather these reports to review what they said. 10/26/14 --I have been able to review notes from the vascular surgery office and these were dated from 10/18/2014. He patient was on her first postop visit and had Doppler ultrasounds done of her arteries. She had more than 50% stenosis of the right superficial femoral artery and more than 50% stenosis in the left tibioperoneal trunk. She was status post a left lower extremity angiogram with angioplasty of the peroneal and left superficial femoral arteries for a nonhealing left foot and ankle ulceration. note is made of the fact that the ABIs had not changed in the last month since her previous visit. The opinion of her provider was that she did not need any intervention at this time and they had done everything they could at the present time. They would continue on Plavix and antibiotics and see her back in 3 months for a another arterial duplex study of the lower  extremities. [name] ([id number]) 11/02/2014 -- she has spoken to her caregivers about coming for HBO 5 times per week for 6-8 weeks and they are agreeable about this and she is willing to undergo hyperbaric oxygen therapy. 11/16/2014 she has been worked up with a EKG and chest x-ray and these were within normal limits. As far as the investigations go she is ready for HBOT. we are awaiting some insurance clearance so that she can start on her hyperbaric oxygen therapy. 11/30/2014 she tolerated hyperbaric oxygen therapy fine and there were no issues with her blood glucose levels. As noted she is not a diabetic and most of her fingerstick blood glucose levels are inaccurate because of the Raynaud's phenomena. She normally has to have your lobe sticks to get any random glucose levels. I have also requested her primary care does a venous blood glucose draw to check her glucose level. 12/21/2014 -- she is tolerating hyperbaric oxygen very well and has overall made a lot of progress. Her left leg is looking much better. 01/11/2015 - No new complaints today. New ulceration on the dorsum of the right second toe noted today on physical exam. Patient unaware. No significant pain. No fever or chills. No significant drainage. Offloading with sage boots at night. Significantly improved with HBO. 01/19/2015 - the patient has had a x-ray of the right foot done at the nursing home but we still do not have the report and we are awaiting this result. She is otherwise doing fine with her general health. 01/25/2015 -- X-ray of her right foot showed no acute fracture, dislocation, bony infection or osteomyelitis. The was some soft tissue swelling and osteopenia seen. 02/15/2015 -- due to some insurance issues we  could not get her epifix and we are working with her company to get her this. She is doing fine otherwise and has no fresh issues. 04/04/2015 -- she had a follow-up with the vascular surgeon and he  seemed to be pleased with her progress and has had no problems with her vascularity. 05/04/2015 -- the wound on the left posterior ankle had stalled and after talking to the [employer] we have obtained her samples of PuraPly and we will use this on a weekly basis. 05/10/2015 - here for the second application of puraply. Electronic Signature(s) Signed: 05/10/2015 9:09:28 AM By: Christin Fudge MD, FACS Entered By: Christin Fudge on 05/10/2015 09:09:27 [name] ([id number]) -------------------------------------------------------------------------------- Physical Exam Details Patient Name: [name] Date of Service: 05/10/2015 8:00 AM Medical Record Number: [id number] Patient Account Number: [id number] Date of Birth/Sex: [birth year] (81 y.o. Female) Treating RN: Baruch Gouty, RN, BSN, Velva Harman Primary Care Physician: SYSTEM, PCP Other Clinician: Referring Physician: Constance Goltz Treating Physician/Extender: Frann Rider in Treatment: 34 Constitutional . Pulse regular. Respirations normal and unlabored. Afebrile. . Eyes Nonicteric. Reactive to light. Ears, Nose, Mouth, and Throat Lips, teeth, and gums WNL.Marland Kitchen Moist mucosa without lesions . Neck supple and nontender. No palpable supraclavicular or cervical adenopathy. Normal sized without goiter. Respiratory WNL. No retractions.. Breath sounds WNL, No rubs, rales, rhonchi, or wheeze.. Cardiovascular Heart rhythm and rate regular, no murmur or gallop.. Pedal Pulses WNL. No clubbing, cyanosis or edema. Chest Breasts symmetical and no nipple discharge.. Breast tissue WNL, no masses, lumps, or tenderness.. Lymphatic No adneopathy. No adenopathy. No adenopathy. Musculoskeletal Adexa without tenderness or enlargement.. Digits and nails w/o clubbing, cyanosis, infection, petechiae, ischemia, or inflammatory conditions.. Integumentary (Hair, Skin) No suspicious lesions. No crepitus or fluctuance. No peri-wound warmth or erythema.  No masses.Marland Kitchen Psychiatric Judgement and insight Intact.. No evidence of depression, anxiety, or agitation.. Notes The wound has got a layer of the skin substitute which will be sharply debrided the wound edges freshened and a new application applied. Electronic Signature(s) Signed: 05/10/2015 9:09:58 AM By: Christin Fudge MD, FACS Entered By: Christin Fudge on 05/10/2015 09:09:58 [name] ([id number]) -------------------------------------------------------------------------------- Physician Orders Details Patient Name: [name] Date of Service: 05/10/2015 8:00 AM Medical Record Number: [id number] Patient Account Number: [id number] Date of Birth/Sex: July 03, 1928 (81 y.o. Female) Treating RN: Baruch Gouty, RN, BSN, Velva Harman Primary Care Physician: SYSTEM, PCP Other Clinician: Referring Physician: Constance Goltz Treating Physician/Extender: Frann Rider in Treatment: 70 Verbal / Phone Orders: Yes Clinician: Afful, RN, BSN, Rita Read Back and Verified: Yes Diagnosis Coding Wound Cleansing Wound #6 Left Achilles o Clean wound with Normal Saline. Anesthetic Wound #6 Left Achilles o Topical Lidocaine 4% cream applied to wound bed prior to debridement Skin Barriers/Peri-Wound Care Wound #6 Left Achilles o Skin Prep Primary Wound Dressing Wound #6 Left Achilles o Other: - PUraPly antimicrobial wound matrix Secondary Dressing Wound #6 Left Achilles o Gauze and Kerlix/Conform Dressing Change Frequency Wound #6 Left Achilles o Change dressing every week Follow-up Appointments Wound #6 Left Achilles o Return Appointment in 1 week. Electronic Signature(s) Signed: 05/10/2015 8:44:53 AM By: Regan Lemming BSN, RN Signed: 05/10/2015 3:44:09 PM By: Christin Fudge MD, FACS Entered By: Regan Lemming on 05/10/2015 08:44:53 RINKI, JURKOWSKI ([id number]) [place], [place] ([id number]) -------------------------------------------------------------------------------- Problem List  Details Patient Name: [name] Date of Service: 05/10/2015 8:00 AM Medical Record Number: [id number] Patient Account Number: [id number] Date of Birth/Sex: March 11, 1928 (81 y.o. 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 09/12/2014 Yes of heel and midfoot I70.235 Atherosclerosis of native arteries of right leg with 09/12/2014 Yes ulceration of other part of foot Inactive Problems Resolved Problems ICD-10 Code Description Active Date Resolved Date L97.412 Non-pressure chronic ulcer of right heel and midfoot with 10/26/2014 10/26/2014 fat layer exposed S91.104A Unspecified open wound of right lesser toe(s) without 01/11/2015 01/11/2015 damage to nail, initial encounter Electronic Signature(s) Signed: 05/10/2015 9:08:21 AM By: Christin Fudge MD, FACS Entered By: Christin Fudge on 05/10/2015 09:08:21 [name] ([id number]) -------------------------------------------------------------------------------- Progress Note Details Patient Name: [name] Date of Service: 05/10/2015 8:00 AM Medical Record Number: [id number] Patient Account Number: [id number] Date of Birth/Sex: Feb 22, 1928 (81 y.o. Female) Treating RN: Afful, RN, BSN, Velva Harman Primary Care Physician: SYSTEM, PCP Other Clinician: Referring Physician: Constance Goltz Treating Physician/Extender: Frann Rider in Treatment: 70 Subjective Chief Complaint Information obtained from Patient Patient presents to the wound care center today with an open arterial ulcer on the left foot big toe and lateral heal. She also has some superficial  ulcerations on the second and third toe dorsum. she has no family member with her today and she is a very poor historian and cannot give a proper history. As stated that she walks around with a walker. History of Present Illness (HPI) The following HPI elements were documented for the patient's wound: Location: left heel Quality: Patient reports experiencing a dull pain to affected area(s). Severity: Patient states wound are getting worse. Duration: Patient states that they are not certain how long the wound has been present. Timing: Pain in wound is Intermittent (comes and goes Context: The wound appeared gradually over time Associated Signs and Symptoms: Patient reports having difficulty standing for long periods. This 81 year old patient is a poor historian and comes from a nursing home. Does not have any family members with her. Says she has a ulcer on the lateral part of her left foot and some new ones are there on her right foot too. She is unable to say how long she's had these. she does walk with a walker and this is limited most of the day. Does not recall if she has had any vascular studies done. I have reviewed an x-ray done of the left foot which basically shows osteoporosis but no evidence of fracture dislocation or osteomyelitis. 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 She seems to be doing better overall. 10/19/14 -- I understand she has gone to the vascular surgery and lab yesterday and has had all her tests done yesterday. She is doing fine otherwise and has had no fresh issues. 10/26/2014 she seems to be doing well and has no fresh problems and seems much brighter. 10/12/14 -- Taking her antibiotic and continues to do her dressings locally and she is helped by her skilled nursing. She seems to be doing  fine and has no fresh issues. 10/19/14 -- she is doing fine and has had no fresh issues. She says her son to go for her vascular workup yesterday and she has been called back after 3 months. we will try and gather these reports  to review what ELEN, RICKETTS ([id number]) they said. 10/26/14 --I have been able to review notes from the vascular surgery office and these were dated from 10/18/2014. He patient was on her first postop visit and had Doppler ultrasounds done of her arteries. She had more than 50% stenosis of the right superficial femoral artery and more than 50% stenosis in the left tibioperoneal trunk. She was status post a left lower extremity angiogram with angioplasty of the peroneal and left superficial femoral arteries for a nonhealing left foot and ankle ulceration. note is made of the fact that the ABIs had not changed in the last month since her previous visit. The opinion of her provider was that she did not need any intervention at this time and they had done everything they could at the present time. They would continue on Plavix and antibiotics and see her back in 3 months for a another arterial duplex study of the lower extremities. 11/02/2014 -- she has spoken to her caregivers about coming for HBO 5 times per week for 6-8 weeks and they are agreeable about this and she is willing to undergo hyperbaric oxygen therapy. 11/16/2014 she has been worked up with a EKG and chest x-ray and these were within normal limits. As far as the investigations go she is ready for HBOT. we are awaiting some insurance clearance so that she can start on her hyperbaric oxygen therapy. 11/30/2014 she tolerated hyperbaric oxygen therapy fine and there were no issues with her blood glucose levels. As noted she is not a diabetic and most of her fingerstick blood glucose levels are inaccurate because of the Raynaud's phenomena. She normally has to have your lobe sticks to get any random glucose  levels. I have also requested her primary care does a venous blood glucose draw to check her glucose level. 12/21/2014 -- she is tolerating hyperbaric oxygen very well and has overall made a lot of progress. Her left leg is looking much better. 01/11/2015 - No new complaints today. New ulceration on the dorsum of the right second toe noted today on physical exam. Patient unaware. No significant pain. No fever or chills. No significant drainage. Offloading with sage boots at night. Significantly improved with HBO. 01/19/2015 - the patient has had a x-ray of the right foot done at the nursing home but we still do not have the report and we are awaiting this result. She is otherwise doing fine with her general health. 01/25/2015 -- X-ray of her right foot showed no acute fracture, dislocation, bony infection or osteomyelitis. The was some soft tissue swelling and osteopenia seen. 02/15/2015 -- due to some insurance issues we could not get her epifix and we are working with her company to get her this. She is doing fine otherwise and has no fresh issues. 04/04/2015 -- she had a follow-up with the vascular surgeon and he seemed to be pleased with her progress and has had no problems with her vascularity. 05/04/2015 -- the wound on the left posterior ankle had stalled and after talking to the [employer] we have obtained her samples of PuraPly and we will use this on a weekly basis. 05/10/2015 - here for the second application of puraply. [place], Jernie ([id number]) Objective Constitutional Pulse regular. Respirations normal and unlabored. Afebrile. Vitals Time Taken: 8:13 AM, Height: 64 in, Weight: 180 lbs, BMI: 30.9, Temperature: 98.2 F, Pulse: 72 bpm, Respiratory Rate: 16 breaths/min, Blood Pressure: 130/75 mmHg. Eyes Nonicteric. Reactive to light. Ears, Nose, Mouth, and Throat Lips,  teeth, and gums WNL.Marland Kitchen Moist mucosa without lesions . Neck supple and nontender. No palpable  supraclavicular or cervical adenopathy. Normal sized without goiter. Respiratory WNL. No retractions.. Breath sounds WNL, No rubs, rales, rhonchi, or wheeze.. Cardiovascular Heart rhythm and rate regular, no murmur or gallop.. Pedal Pulses WNL. No clubbing, cyanosis or edema. Chest Breasts symmetical and no nipple discharge.. Breast tissue WNL, no masses, lumps, or tenderness.. Lymphatic No adneopathy. No adenopathy. No adenopathy. Musculoskeletal Adexa without tenderness or enlargement.. Digits and nails w/o clubbing, cyanosis, infection, petechiae, ischemia, or inflammatory conditions.Marland Kitchen Psychiatric Judgement and insight Intact.. No evidence of depression, anxiety, or agitation.. General Notes: The wound has got a layer of the skin substitute which will be sharply debrided the wound edges freshened and a new application applied. Integumentary (Hair, Skin) No suspicious lesions. No crepitus or fluctuance. No peri-wound warmth or erythema. No masses.. Wound #6 status is Open. Original cause of wound was Gradually Appeared. The wound is located on the Left Achilles. The wound measures 1cm length x 1cm width x 0.1cm depth; 0.785cm^2 area and 0.079cm^3 volume. The wound is limited to skin breakdown. There is no tunneling or undermining noted. There is a small amount of serous drainage noted. The wound margin is distinct with the outline attached to the wound base. There is medium (34-66%) granulation within the wound bed. There is a small (1-33%) amount of Caney, Glee ([id number]) necrotic tissue within the wound bed including Adherent Slough. The periwound skin appearance exhibited: Localized Edema, Moist, Hemosiderin Staining. The periwound skin appearance did not exhibit: Callus, Crepitus, Excoriation, Fluctuance, Friable, Induration, Rash, Scarring, Dry/Scaly, Maceration, Atrophie Blanche, Cyanosis, Ecchymosis, Mottled, Pallor, Rubor, Erythema. Periwound temperature was noted as No  Abnormality. 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 A skin graft procedure using a bioengineered skin substitute/cellular or tissue based product was performed by Britto, Jackson Latino., MD. Other was applied and secured with Steri-Strips. 1 sq cm of product was utilized and 53 sq cm was wasted. Post Application, mepitel was applied. A Time Out was conducted prior to the start of the procedure. The procedure was tolerated well with a pain level of 0 throughout and a pain level of 0 following the procedure. Post procedure Diagnosis Wound #6: Same as Pre-Procedure . Plan Wound Cleansing: Wound #6 Left Achilles: Clean wound with Normal Saline. Anesthetic: DASANY, HAUER ([id number]) Wound #6 Left Achilles: Topical Lidocaine 4% cream applied to wound bed prior to debridement Skin Barriers/Peri-Wound Care: Wound #6 Left Achilles: Skin Prep Primary Wound Dressing: Wound #6 Left Achilles: Other: - PUraPly antimicrobial wound matrix Secondary Dressing: Wound #6 Left Achilles: Gauze and Kerlix/Conform Dressing Change Frequency: Wound #6 Left Achilles: Change dressing every week Follow-up Appointments: Wound #6 Left Achilles: Return Appointment in 1 week. After cleaning the wound nicely and using curettage with freshening the edges and the wound base a second application of puraply was applied with the usual  precautions and fixed in place with a  bolster and a nonadherent dressing. She will be seen back by as next week Electronic Signature(s) Signed: 05/15/2015 3:56:09 PM By: Christin Fudge MD, FACS Previous Signature: 05/15/2015 3:55:48 PM Version By: Christin Fudge MD, FACS Previous Signature: 05/10/2015 9:11:09 AM Version By: Christin Fudge MD, FACS Entered By: Christin Fudge on 05/15/2015 15:56:09 [name] ([id number]) -------------------------------------------------------------------------------- SuperBill Details Patient Name: [name] Date of Service: 05/10/2015 Medical Record Number: [id number] Patient Account Number: [id number] Date of Birth/Sex: 18-Sep-1927 (81 y.o. 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 arteries of right leg with ulceration of other part of foot Facility Procedures CPT4: Description Modifier Quantity Code [id number] - SKIN SUB GRAFT FACE/NK/HF/G 1 ICD-10 Description Diagnosis 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 of native arteries of right leg with ulceration of other part of foot Physician Procedures CPT4: Description Modifier Quantity Code [id number] - WC PHYS SKIN SUB GRAFT FACE/NK/HF/G 1 ICD-10 Description Diagnosis L97.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 I70.244 Atherosclerosis of native arteries of  left leg with ulceration of heel and  midfoot I70.235 Atherosclerosis of native arteries of right leg with ulceration of other part of foot Electronic Signature(s) Signed: 05/10/2015 9:11:22 AM By: Christin Fudge MD, FACS Entered By: Christin Fudge on 05/10/2015 09:11:22

## 2015-05-17 ENCOUNTER — Encounter: Payer: Medicare Other | Admitting: Surgery

## 2015-05-17 DIAGNOSIS — L97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: Secondary | ICD-10-CM | POA: Diagnosis not present

## 2015-05-17 NOTE — Progress Notes (Signed)
[name] ([id number]) Visit Report for 05/17/2015 Arrival Information Details Patient Name: [name], [name] Date of Service: 05/17/2015 8:45 AM Medical Record Number: [id number] Patient Account Number: [id number] Date of Birth/Sex: 1928/05/01 (81 y.o. Female) Treating RN: Afful, RN, BSN, Velva Harman Primary Care Physician: SYSTEM, PCP Other Clinician: Referring Physician: Constance Goltz Treating Physician/Extender: Frann Rider in Treatment: 19 Visit Information History Since Last Visit Any new allergies or adverse reactions: No Patient Arrived: Wheel Chair Had a fall or experienced change in No Arrival Time: 08:53 activities of daily living that may affect Accompanied By: driver in lobby risk of falls: Transfer Assistance: None Signs or symptoms of abuse/neglect since last No Patient Identification Verified: Yes visito Secondary Verification Process Yes Hospitalized since last visit: No Completed: Has Dressing in Place as Prescribed: Yes Patient Requires Transmission- No Pain Present Now: No Based Precautions: Patient Has Alerts: Yes Patient Alerts: Patient on Blood Thinner Electronic Signature(s) Signed: 05/17/2015 8:56:12 AM By: Regan Lemming BSN, RN Entered By: Regan Lemming on 05/17/2015 08:56:12 [name] ([id number]) -------------------------------------------------------------------------------- Encounter Discharge Information Details Patient Name: [name] Date of Service: 05/17/2015 8:45 AM Medical Record Number: [id number] Patient Account Number: [id number] Date of Birth/Sex: Oct 21, 1927 (81 y.o. Female) Treating RN: Afful, RN, BSN, Velva Harman Primary Care Physician: SYSTEM, PCP Other Clinician: Referring Physician: Constance Goltz Treating Physician/Extender: Frann Rider in Treatment: 22 Encounter Discharge Information Items Discharge Pain Level: 0 Discharge Condition: Stable Ambulatory Status: Wheelchair Discharge Destination:  Home Transportation: Private Auto dad in the Accompanied By: lobby Schedule Follow-up Appointment: No Medication Reconciliation completed and provided to Patient/Care No Provider: Provided on Clinical Summary of Care: 05/17/2015 Form Type Recipient Paper Patient DJ Electronic Signature(s) Signed: 05/17/2015 9:17:01 AM By: Ruthine Dose Previous Signature: 05/17/2015 9:11:41 AM Version By: Regan Lemming BSN, RN Entered By: Ruthine Dose on 05/17/2015 09:17:01 [name] ([id number]) -------------------------------------------------------------------------------- Lower Extremity Assessment Details Patient Name: [name] Date of Service: 05/17/2015 8:45 AM Medical Record Number: [id number] Patient Account Number: [id number] Date of Birth/Sex: 08/09/1928 (81 y.o. Female) Treating RN: Afful, RN, BSN, Velva Harman Primary Care Physician: SYSTEM, PCP Other Clinician: Referring Physician: Constance Goltz Treating Physician/Extender: Frann Rider in Treatment: 35 Vascular Assessment Pulses: Posterior Tibial Dorsalis Pedis Palpable: [Left:No] Doppler: [Left:Monophasic] Extremity colors, hair growth, and conditions: Extremity Color: [Left:Dusky] Hair Growth on Extremity: [Left:No] Temperature of Extremity: [Left:Warm] Capillary Refill: [Left:< 3 seconds] Toe Nail Assessment Left: Right: Thick: Yes Discolored: Yes Deformed: No Improper Length and Hygiene: No Electronic Signature(s) Signed: 05/17/2015 9:00:00 AM By: Regan Lemming BSN, RN Entered By: Regan Lemming on 05/17/2015 09:00:00 [name] ([id number]) -------------------------------------------------------------------------------- Multi Wound Chart Details Patient Name: [name] Date of Service: 05/17/2015 8:45 AM Medical Record Number: [id number] Patient Account Number: [id number] Date of Birth/Sex: [birth year] (81 y.o. 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 N/A] Comorbid History: [6:Glaucoma, Asthma, Hypertension, Peripheral Venous Disease, Type II Diabetes] [N/A:N/A] Date Acquired: [6:09/26/2014] [N/A:N/A] Weeks of Treatment: [6:32] [N/A:N/A] Wound Status: [6:Open] [N/A:N/A] Measurements L x W x D 1x1x0.1 [N/A:N/A] (cm) Area (cm) : [6:0.785] [N/A:N/A] Volume (cm) : [6:0.079] [N/A:N/A] % Reduction in Area: [6:79.20%] [N/A:N/A] % Reduction in Volume: 95.80% [N/A:N/A] Classification: [6:Partial Thickness] [N/A:N/A] HBO  Classification: [6:Grade 1] [N/A:N/A] Exudate Amount: [6:Small] [N/A:N/A] Exudate Type: [6:Serous] [N/A:N/A] Exudate Color: [6:amber] [N/A:N/A] Wound Margin: [6:Distinct, outline attached N/A] Granulation Amount: [6:Medium (34-66%)] [N/A:N/A] Necrotic Amount: [6:Small (1-33%)] [N/A:N/A] Exposed Structures: [6:Fascia: No Fat: No Tendon: No Muscle: No Joint: No] [N/A:N/A] Bone: No Limited to Skin Breakdown Epithelialization: Medium (34-66%) N/A N/A Periwound Skin Texture: Edema: Yes N/A N/A Excoriation: No Induration: No Callus: No Crepitus: No Fluctuance: No Friable: No Rash: No Scarring: No Periwound Skin Moist: Yes N/A N/A Moisture: Maceration: No Dry/Scaly: No Periwound Skin Color: Hemosiderin Staining: Yes N/A N/A Atrophie Blanche: No Cyanosis: No Ecchymosis: No Erythema: No Mottled: No Pallor: No Rubor: No Temperature: No Abnormality N/A N/A Tenderness on No N/A N/A Palpation: Wound Preparation: Ulcer Cleansing: N/A  N/A Rinsed/Irrigated with Saline Topical Anesthetic Applied: Other: lidocaine 4% Treatment Notes Electronic Signature(s) Signed: 05/17/2015 9:02:41 AM By: Regan Lemming BSN, RN Entered By: Regan Lemming on 05/17/2015 09:02:41 [name], [name] ([id number]) -------------------------------------------------------------------------------- Multi-Disciplinary Care Plan Details Patient Name: [name] Date of Service: 05/17/2015 8:45 AM Medical Record Number: [id number] Patient Account Number: [id number] Date of Birth/Sex: 1928-07-17 (81 y.o. Female) Treating RN: Afful, RN, BSN, Velva Harman Primary Care Physician: SYSTEM, PCP Other Clinician: Referring Physician: Constance Goltz Treating Physician/Extender: Frann Rider in Treatment: 48 Active Inactive Abuse / Safety / Falls / Self Care Management Nursing Diagnoses: Impaired physical mobility Potential for falls Goals: Patient will remain injury free Date Initiated: 09/12/2014 Goal Status: Active Patient/caregiver will verbalize understanding of skin care regimen Date Initiated: 09/12/2014 Goal Status: Active Patient/caregiver will verbalize/demonstrate measures taken to prevent injury and/or falls Date Initiated: 09/12/2014 Goal Status: Active Patient/caregiver will verbalize/demonstrate understanding of what to do in case of emergency Date Initiated: 09/12/2014 Goal Status: Active Interventions: Assess fall risk on admission and as needed Assess: immobility, friction, shearing, incontinence upon admission and as needed Assess impairment of mobility on admission and as needed per policy Provide education on basic hygiene Provide education on fall prevention Provide education on personal and home safety Provide education on safe transfers Treatment Activities: Education provided on Basic Hygiene : 10/12/2014 Notes: Necrotic Tissue [name], [name] ([id number]) Nursing Diagnoses: Impaired tissue integrity related to  necrotic/devitalized tissue Knowledge deficit related to management of necrotic/devitalized tissue Goals: Necrotic/devitalized tissue will be minimized in the wound bed Date Initiated: 09/12/2014 Goal Status: Active Patient/caregiver will verbalize understanding of reason and process for debridement of necrotic tissue Date Initiated: 09/12/2014 Goal Status: Active Interventions: Assess patient pain level pre-, during and post procedure and prior to discharge Provide education on necrotic tissue and debridement process Treatment Activities: Apply topical anesthetic as ordered : 05/17/2015 Enzymatic debridement : 05/17/2015 Excisional debridement : 05/17/2015 Notes: Orientation to the Wound Care Program Nursing Diagnoses: Knowledge deficit related to the wound healing center program Goals: Patient/caregiver will verbalize understanding of the [place] Program Date Initiated: 09/12/2014 Goal Status: Active Interventions: Provide education on orientation to the wound center Notes: Pressure Nursing Diagnoses: Knowledge deficit related to causes and risk factors for pressure ulcer development Knowledge deficit related to management of pressures ulcers Potential for impaired tissue integrity related to pressure, friction, moisture, and shear GoalsKENDRIX, [name] ([id number]) Patient will remain free from development of additional pressure ulcers Date Initiated: 09/12/2014 Goal Status: Active Patient will remain free of pressure ulcers Date Initiated: 09/12/2014 Goal Status: Active Patient/caregiver will verbalize risk factors for pressure ulcer development Date Initiated: 09/12/2014 Goal Status: Active Patient/caregiver will verbalize understanding of pressure ulcer management Date Initiated: 09/12/2014 Goal Status: Active Interventions: Assess:  immobility, friction, shearing, incontinence upon admission and as needed Assess offloading mechanisms upon admission and as  needed Assess potential for pressure ulcer upon admission and as needed Provide education on pressure ulcers Treatment Activities: Pressure reduction/relief device ordered : 05/17/2015 Notes: Wound/Skin Impairment Nursing Diagnoses: Impaired tissue integrity Knowledge deficit related to ulceration/compromised skin integrity Goals: Patient/caregiver will verbalize understanding of skin care regimen Date Initiated: 09/12/2014 Goal Status: Active Ulcer/skin breakdown will heal within 14 weeks Date Initiated: 09/12/2014 Goal Status: Active Interventions: Assess patient/caregiver ability to obtain necessary supplies Assess patient/caregiver ability to perform ulcer/skin care regimen upon admission and as needed Assess ulceration(s) every visit Provide education on ulcer and skin care Treatment Activities: Skin care regimen initiated : 05/17/2015 [name], [name] ([id number]) Topical wound management initiated : 05/17/2015 Notes: Electronic Signature(s) Signed: 05/17/2015 9:02:33 AM By: Regan Lemming BSN, RN Entered By: Regan Lemming on 05/17/2015 09:02:33 [name] ([id number]) -------------------------------------------------------------------------------- Pain Assessment Details Patient Name: [name] Date of Service: 05/17/2015 8:45 AM Medical Record Number: [id number] Patient Account Number: [id number] Date of Birth/Sex: [birth year] (81 y.o. Female) Treating RN: Baruch Gouty, RN, BSN, Velva Harman Primary Care Physician: SYSTEM, PCP Other Clinician: Referring Physician: Constance Goltz Treating Physician/Extender: Frann Rider in Treatment: 35 Active Problems Location of Pain Severity and Description of Pain Patient Has Paino No Site Locations Pain Management and Medication Current Pain Management: Electronic Signature(s) Signed: 05/17/2015 8:56:19 AM By: Regan Lemming BSN, RN Entered By: Regan Lemming on 05/17/2015 08:56:19 [name]  ([id number]) -------------------------------------------------------------------------------- Patient/Caregiver Education Details Patient Name: [name] Date of Service: 05/17/2015 8:45 AM Medical Record Number: [id number] Patient Account Number: [id number] Date of Birth/Gender: [birth year] (81 y.o. Female) Treating RN: Afful, RN, BSN, Velva Harman Primary Care Physician: SYSTEM, PCP Other Clinician: Referring Physician: Constance Goltz Treating Physician/Extender: Frann Rider in Treatment: 25 Education Assessment Education Provided To: Patient Education Topics Provided Basic Hygiene: Methods: Explain/Verbal Responses: State content correctly Pressure: Methods: Explain/Verbal Responses: State content correctly Safety: Methods: Explain/Verbal Responses: State content correctly Welcome To [facility] [place]: Methods: Explain/Verbal Responses: State content correctly Wound Debridement: Methods: Explain/Verbal Responses: State content correctly Wound/Skin Impairment: Methods: Explain/Verbal Responses: State content correctly Electronic Signature(s) Signed: 05/17/2015 9:12:09 AM By: Regan Lemming BSN, RN Entered By: Regan Lemming on 05/17/2015 09:12:09 [name] ([id number]) -------------------------------------------------------------------------------- Wound Assessment Details Patient Name: [name] Date of Service: 05/17/2015 8:45 AM Medical Record Number: [id number] Patient Account Number: [id number] Date of Birth/Sex: 08-Sep-1927 (81 y.o. Female) Treating RN: Afful, RN, BSN, [employer] Primary Care Physician: SYSTEM, PCP Other Clinician: Referring Physician: Constance Goltz Treating Physician/Extender: Frann Rider in Treatment: 35 Wound Status Wound Number: 6 Primary Arterial Insufficiency Ulcer Etiology: Wound Location: Left Achilles Wound Open Wounding Event: Gradually Appeared Status: Date Acquired: 09/26/2014 Comorbid Glaucoma, Asthma,  Hypertension, Weeks Of Treatment: 32 History: Peripheral Venous Disease, Type II Clustered Wound: No Diabetes Photos Photo Uploaded By: Regan Lemming on 05/17/2015 16:33:40 Wound Measurements Length: (cm) 1 Width: (cm) 1 Depth: (cm) 0.1 Area: (cm) 0.785 Volume: (cm) 0.079 % Reduction in Area: 79.2% % Reduction in Volume: 95.8% Epithelialization: Medium (34-66%) Tunneling: No Undermining: No Wound Description Classification: Partial Thickness Foul O Diabetic Severity (Wagner): Grade 1 Wound Margin: Distinct, outline attached Exudate Amount: Small Exudate Type: Serous Exudate Color: amber dor After Cleansing: No Wound Bed Granulation Amount: Medium (34-66%) Exposed Structure Necrotic Amount: Small (1-33%) Fascia Exposed: No Necrotic Quality: Adherent Slough Fat Layer Exposed: No [name], [name] ([id number]) Tendon Exposed: No Muscle Exposed: No Joint Exposed: No Bone Exposed: No Limited to Skin Breakdown  Periwound Skin Texture Texture Color No Abnormalities Noted: No No Abnormalities Noted: No Callus: No Atrophie Blanche: No Crepitus: No Cyanosis: No Excoriation: No Ecchymosis: No Fluctuance: No Erythema: No Friable: No Hemosiderin Staining: Yes Induration: No Mottled: No Localized Edema: Yes Pallor: No Rash: No Rubor: No Scarring: No Temperature / Pain Moisture Temperature: No Abnormality No Abnormalities Noted: No Dry / Scaly: No Maceration: No Moist: Yes Wound Preparation Ulcer Cleansing: Rinsed/Irrigated with Saline Topical Anesthetic Applied: Other: lidocaine 4%, Treatment Notes Wound #6 (Left Achilles) 5. Secondary Dressing Applied Dry Gauze Kerlix/Conform Notes Puraply applied Electronic Signature(s) Signed: 05/17/2015 9:02:20 AM By: Regan Lemming BSN, RN Entered By: Regan Lemming on 05/17/2015 09:02:19 [name] ([id number]) -------------------------------------------------------------------------------- Vitals Details Patient  Name: [name] Date of Service: 05/17/2015 8:45 AM Medical Record Number: [id number] Patient Account Number: [id number] Date of Birth/Sex: 15-Apr-1928 (81 y.o. Female) Treating RN: Afful, RN, BSN, [place] Primary Care Physician: SYSTEM, PCP Other Clinician: Referring Physician: Constance Goltz Treating Physician/Extender: Frann Rider in Treatment: 35 Vital Signs Time Taken: 09:00 Temperature (F): 98.1 Height (in): 64 Pulse (bpm): 69 Weight (lbs): 180 Respiratory Rate (breaths/min): 16 Body Mass Index (BMI): 30.9 Blood Pressure (mmHg): 136/65 Reference Range: 80 - 120 mg / dl Electronic Signature(s) Signed: 05/17/2015 8:59:00 AM By: Regan Lemming BSN, RN Entered By: Regan Lemming on 05/17/2015 08:59:00

## 2015-05-18 NOTE — Progress Notes (Signed)
[name], [name] ([id number]) Visit Report for 05/17/2015 Chief Complaint Document Details Patient Name: [name], [name] Date of Service: 05/17/2015 8:45 AM Medical Record Number: [id number] Patient Account Number: [id number] Date of Birth/Sex: 1928-02-14 (81 y.o. Female) Treating RN: Afful, RN, BSN, Velva Harman Primary Care Physician: SYSTEM, PCP Other Clinician: Referring Physician: Constance Goltz Treating Physician/Extender: Frann Rider in Treatment: 28 Information Obtained from: Patient Chief Complaint Patient presents to the wound care center today with an open arterial ulcer on the left foot big toe and lateral heal. She also has some superficial ulcerations on the second and third toe dorsum. she has no family member with her today and she is a very poor historian and cannot give a proper history. As stated that she walks around with a walker. Electronic Signature(s) Signed: 05/17/2015 9:33:48 AM By: Christin Fudge MD, FACS Entered By: Christin Fudge on 05/17/2015 09:33:48 [name] ([id number]) -------------------------------------------------------------------------------- Cellular or Tissue Based Product Details Patient Name: [name] Date of Service: 05/17/2015 8:45 AM Medical Record Number: [id number] Patient Account Number: [id number] Date of Birth/Sex: 1927/08/22 (81 y.o. Female) Treating RN: Afful, RN, BSN, Velva Harman Primary Care Physician: SYSTEM, PCP Other Clinician: Referring Physician: Constance Goltz Treating Physician/Extender: Frann Rider in Treatment: 32 Cellular or Tissue Based Wound #6 Left Achilles Product Type Applied to: Performed By: Physician Pat Patrick., MD Cellular or Tissue Based Other Product Type: Time-Out Taken: Yes Location: genitalia / hands / feet / multiple digits Wound Size (sq cm): 1 Product Size (sq cm): 4 Waste Size (sq cm): 0 Amount of Product Applied (sq cm): 4 Lot #: [id number] Expiration Date:  03/05/2016 Fenestrated: No Reconstituted: No Secured: Yes Secured With: Steri-Strips Dressing Applied: Yes Primary Dressing: mepitel one Procedural Pain: 0 Post Procedural Pain: 0 Response to Treatment: Procedure was tolerated well Post Procedure Diagnosis Same as Pre-procedure Electronic Signature(s) Signed: 05/17/2015 9:33:41 AM By: Christin Fudge MD, FACS Previous Signature: 05/17/2015 9:33:02 AM Version By: Regan Lemming BSN, RN Entered By: Christin Fudge on 05/17/2015 09:33:41 [name] ([id number]) -------------------------------------------------------------------------------- HPI Details Patient Name: [name] Date of Service: 05/17/2015 8:45 AM Medical Record Number: [id number] Patient Account Number: [id number] Date of Birth/Sex: Sep 04, 1927 (81 y.o. Female) Treating RN: Baruch Gouty, RN, BSN, Velva Harman Primary Care Physician: SYSTEM, PCP Other Clinician: Referring Physician: Constance Goltz Treating Physician/Extender: Frann Rider in Treatment: 35 History of Present Illness Location: left heel Quality: Patient reports experiencing a dull pain to affected area(s). Severity: Patient states wound are getting worse. Duration: Patient states that they are not certain how long the wound has been present. Timing: Pain in wound is Intermittent (comes and goes Context: The wound appeared gradually over time Associated Signs and Symptoms: Patient reports having difficulty standing for long periods. HPI Description: This 81 year old patient is a poor historian and comes from a nursing home. Does not have any family members with her. Says she has a ulcer on the lateral part of her left foot and some new ones are there on her right foot too. She is unable to say how long she's had these. she does walk with a walker and this is limited most of the day. Does not recall if she has had any vascular studies done. I have reviewed an x-ray done of the left foot which basically shows  osteoporosis but no evidence of fracture dislocation or osteomyelitis. 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 She seems to be doing better overall. 10/19/14 -- I understand she has gone to the vascular surgery and lab yesterday and has had all her tests done yesterday. She is doing fine otherwise and has had no fresh issues. 10/26/2014 she seems to be doing well and has no fresh problems and seems much brighter. 10/12/14 -- Taking her antibiotic and continues to do her dressings locally and she is helped by her skilled nursing. She seems to be doing fine and has no fresh issues. 10/19/14 -- she is doing fine and has had no fresh issues. She says her son to go for her vascular workup yesterday and she has been called back after 3 months. we will try and gather these reports to review what they said. 10/26/14 --I have been able to review notes from the vascular surgery office and these were dated from 10/18/2014. He patient was on her first postop visit and had Doppler ultrasounds done of her arteries. She had more than 50% stenosis of the right superficial femoral artery and more than 50% stenosis in the left tibioperoneal trunk. She was status post a left lower extremity angiogram with angioplasty of the peroneal and left superficial femoral arteries for a nonhealing left foot and ankle ulceration. note is made of the fact that the ABIs had not changed in the last month since her previous visit. The opinion of her Kimba Lottes was that she did not need any intervention at this time and they had done everything they could at the present time. They would continue on Plavix and antibiotics and see her back in 3 months for a another arterial duplex study of the lower extremities. [name], [name] ([id number]) 11/02/2014 -- she has spoken  to her caregivers about coming for HBO 5 times per week for 6-8 weeks and they are agreeable about this and she is willing to undergo hyperbaric oxygen therapy. 11/16/2014 she has been worked up with a EKG and chest x-ray and these were within normal limits. As far as the investigations go she is ready for HBOT. we are awaiting some insurance clearance so that she can start on her hyperbaric oxygen therapy. 11/30/2014 she tolerated hyperbaric oxygen therapy fine and there were no issues with her blood glucose levels. As noted she is not a diabetic and most of her fingerstick blood glucose levels are inaccurate because of the Raynaud's phenomena. She normally has to have your lobe sticks to get any random glucose levels. I have also requested her primary care does a venous blood glucose draw to check her glucose level. 12/21/2014 -- she is tolerating hyperbaric oxygen very well and has overall made a lot of progress. Her left leg is looking much better. 01/11/2015 - No new complaints today. New ulceration on the dorsum of the right second toe noted today on physical exam. Patient unaware. No significant pain. No fever or chills. No significant drainage. Offloading with sage boots at night. Significantly improved with HBO. 01/19/2015 - the patient has had a x-ray of the right foot done at the nursing home but we still do not have the report and we are awaiting this result. She is otherwise doing fine with her general health. 01/25/2015 -- X-ray of her right foot showed no acute fracture, dislocation, bony infection or osteomyelitis. The was some soft tissue swelling and osteopenia seen. 02/15/2015 -- due to some insurance issues we could not get her epifix and we are working with her company to  get her this. She is doing fine otherwise and has no fresh issues. 04/04/2015 -- she had a follow-up with the vascular surgeon and he seemed to be pleased with her progress and has had no problems with her  vascularity. 05/04/2015 -- the wound on the left posterior ankle had stalled and after talking to the [employer] we have obtained her samples of PuraPly and we will use this on a weekly basis. 05/10/2015 - here for the second application of puraply. 05/17/2015 -- she is here for her third application of puraply Electronic Signature(s) Signed: 05/17/2015 9:34:23 AM By: Christin Fudge MD, FACS Entered By: Christin Fudge on 05/17/2015 09:34:23 [name] ([id number]) -------------------------------------------------------------------------------- Physical Exam Details Patient Name: [name] Date of Service: 05/17/2015 8:45 AM Medical Record Number: [id number] Patient Account Number: [id number] Date of Birth/Sex: 11/28/1927 (81 y.o. Female) Treating RN: Baruch Gouty, RN, BSN, Velva Harman Primary Care Physician: SYSTEM, PCP Other Clinician: Referring Physician: Constance Goltz Treating Physician/Extender: Frann Rider in Treatment: 35 Constitutional . Pulse regular. Respirations normal and unlabored. Afebrile. . Eyes Nonicteric. Reactive to light. Ears, Nose, Mouth, and Throat Lips, teeth, and gums WNL.Marland Kitchen Moist mucosa without lesions . Neck supple and nontender. No palpable supraclavicular or cervical adenopathy. Normal sized without goiter. Respiratory WNL. No retractions.. Cardiovascular Pedal Pulses WNL. No clubbing, cyanosis or edema. Lymphatic No adneopathy. No adenopathy. No adenopathy. Musculoskeletal Adexa without tenderness or enlargement.. Digits and nails w/o clubbing, cyanosis, infection, petechiae, ischemia, or inflammatory conditions.. Integumentary (Hair, Skin) No suspicious lesions. No crepitus or fluctuance. No peri-wound warmth or erythema. No masses.Marland Kitchen Psychiatric Judgement and insight Intact.. No evidence of depression, anxiety, or agitation.. Notes The wound looks clean but has not decreased much in size and the wound edges and the base have  been debrided and the next layer of puraply has been applied. Electronic Signature(s) Signed: 05/17/2015 9:35:13 AM By: Christin Fudge MD, FACS Entered By: Christin Fudge on 05/17/2015 09:35:13 [name] ([id number]) -------------------------------------------------------------------------------- Physician Orders Details Patient Name: [name] Date of Service: 05/17/2015 8:45 AM Medical Record Number: [id number] Patient Account Number: [id number] Date of Birth/Sex: [birth year] (81 y.o. Female) Treating RN: Baruch Gouty, RN, BSN, Velva Harman Primary Care Physician: SYSTEM, PCP Other Clinician: Referring Physician: Constance Goltz Treating Physician/Extender: Frann Rider in Treatment: 68 Verbal / Phone Orders: Yes Clinician: Afful, RN, BSN, Rita Read Back and Verified: Yes Diagnosis Coding Wound Cleansing Wound #6 Left Achilles o Clean wound with Normal Saline. Anesthetic Wound #6 Left Achilles o Topical Lidocaine 4% cream applied to wound bed prior to debridement Skin Barriers/Peri-Wound Care Wound #6 Left Achilles o Skin Prep Primary Wound Dressing Wound #6 Left Achilles o Other: - PUraPly antimicrobial wound matrix Secondary Dressing Wound #6 Left Achilles o Gauze and Kerlix/Conform Dressing Change Frequency Wound #6 Left Achilles o Change dressing every week Follow-up Appointments Wound #6 Left Achilles o Return Appointment in 1 week. Electronic Signature(s) Signed: 05/17/2015 9:10:18 AM By: Regan Lemming BSN, RN Signed: 05/17/2015 5:16:51 PM By: Christin Fudge MD, FACS Entered By: Regan Lemming on 05/17/2015 09:10:17 NOELENE, RINES ([id number]) FELMA, SANTOSO ([id number]) -------------------------------------------------------------------------------- Problem List Details Patient Name: [name] Date of Service: 05/17/2015 8:45 AM Medical Record Number: [id number] Patient Account Number: [id number] Date of Birth/Sex: 12/19/1927 (81 y.o. 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 09/12/2014 Yes of heel and midfoot I70.235 Atherosclerosis of native arteries of right leg with 09/12/2014 Yes ulceration of other part of foot Inactive Problems Resolved Problems ICD-10 Code Description Active Date Resolved Date L97.412 Non-pressure chronic ulcer of right heel and midfoot with 10/26/2014 10/26/2014 fat layer exposed S91.104A Unspecified open wound of right lesser toe(s) without 01/11/2015 01/11/2015 damage to nail, initial encounter Electronic Signature(s) Signed: 05/17/2015 9:19:06 AM By: Christin Fudge MD, FACS Entered By: Christin Fudge on 05/17/2015 09:19:06 [name] ([id number]) -------------------------------------------------------------------------------- Progress Note Details Patient Name: [name] Date of Service: 05/17/2015 8:45 AM Medical Record Number: [id number] Patient Account Number: [id number] Date of Birth/Sex: 04/25/1928 (81 y.o. Female) Treating RN: Afful, RN, BSN, Velva Harman Primary Care Physician: SYSTEM, PCP Other Clinician: Referring Physician: Constance Goltz Treating Physician/Extender: Frann Rider in Treatment: 35 Subjective Chief Complaint Information obtained from Patient Patient presents to the wound care center today with an open arterial ulcer on the left foot big toe and lateral heal. She also has some superficial ulcerations on the second and third toe dorsum. she has no family member with her today and she is a very poor historian and cannot give a proper history. As stated that she walks around with a  walker. History of Present Illness (HPI) The following HPI elements were documented for the patient's wound: Location: left heel Quality: Patient reports experiencing a dull pain to affected area(s). Severity: Patient states wound are getting worse. Duration: Patient states that they are not certain how long the wound has been present. Timing: Pain in wound is Intermittent (comes and goes Context: The wound appeared gradually over time Associated Signs and Symptoms: Patient reports having difficulty standing for long periods. This 81 year old patient is a poor historian and comes from a nursing home. Does not have any family members with her. Says she has a ulcer on the lateral part of her left foot and some new ones are there on her right foot too. She is unable to say how long she's had these. she does walk with a walker and this is limited most of the day. Does not recall if she has had any vascular studies done. I have reviewed an x-ray done of the left foot which basically shows osteoporosis but no evidence of fracture dislocation or osteomyelitis. 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 She seems to be doing better overall. 10/19/14 -- I understand she has gone to the vascular surgery and lab yesterday and has had all her tests done yesterday. She is doing fine otherwise and has had no fresh issues. 10/26/2014 she seems to be doing well and has no fresh problems and seems much brighter. 10/12/14 -- Taking her antibiotic and continues to do her dressings locally and she is helped by her skilled nursing. She seems to be doing fine and has no fresh issues. 10/19/14 -- she is doing fine and has had no fresh issues. She says her son to go for her vascular workup yesterday and she has been called back after 3 months. we will  try and gather these reports to review what ALAYA, KRESSIN ([id number]) they said. 10/26/14 --I have been able to review notes from the vascular surgery office and these were dated from 10/18/2014. He patient  was on her first postop visit and had Doppler ultrasounds done of her arteries. She had more than 50% stenosis of the right superficial femoral artery and more than 50% stenosis in the left tibioperoneal trunk. She was status post a left lower extremity angiogram with angioplasty of the peroneal and left superficial femoral arteries for a nonhealing left foot and ankle ulceration. note is made of the fact that the ABIs had not changed in the last month since her previous visit. The opinion of her Jeorgia Helming was that she did not need any intervention at this time and they had done everything they could at the present time. They would continue on Plavix and antibiotics and see her back in 3 months for a another arterial duplex study of the lower extremities. 11/02/2014 -- she has spoken to her caregivers about coming for HBO 5 times per week for 6-8 weeks and they are agreeable about this and she is willing to undergo hyperbaric oxygen therapy. 11/16/2014 she has been worked up with a EKG and chest x-ray and these were within normal limits. As far as the investigations go she is ready for HBOT. we are awaiting some insurance clearance so that she can start on her hyperbaric oxygen therapy. 11/30/2014 she tolerated hyperbaric oxygen therapy fine and there were no issues with her blood glucose levels. As noted she is not a diabetic and most of her fingerstick blood glucose levels are inaccurate because of the Raynaud's phenomena. She normally has to have your lobe sticks to get any random glucose levels. I have also requested her primary care does a venous blood glucose draw to check her glucose level. 12/21/2014 -- she is tolerating hyperbaric oxygen very well and has overall made a lot of  progress. Her left leg is looking much better. 01/11/2015 - No new complaints today. New ulceration on the dorsum of the right second toe noted today on physical exam. Patient unaware. No significant pain. No fever or chills. No significant drainage. Offloading with sage boots at night. Significantly improved with HBO. 01/19/2015 - the patient has had a x-ray of the right foot done at the nursing home but we still do not have the report and we are awaiting this result. She is otherwise doing fine with her general health. 01/25/2015 -- X-ray of her right foot showed no acute fracture, dislocation, bony infection or osteomyelitis. The was some soft tissue swelling and osteopenia seen. 02/15/2015 -- due to some insurance issues we could not get her epifix and we are working with her company to get her this. She is doing fine otherwise and has no fresh issues. 04/04/2015 -- she had a follow-up with the vascular surgeon and he seemed to be pleased with her progress and has had no problems with her vascularity. 05/04/2015 -- the wound on the left posterior ankle had stalled and after talking to the [employer] we have obtained her samples of PuraPly and we will use this on a weekly basis. 05/10/2015 - here for the second application of puraply. 05/17/2015 -- she is here for her third application of [place], [place] ([id number]) Objective Constitutional Pulse regular. Respirations normal and unlabored. Afebrile. Vitals Time Taken: 9:00 AM, Height: 64 in, Weight: 180 lbs, BMI: 30.9, Temperature: 98.1 F, Pulse: 69 bpm, Respiratory Rate: 16 breaths/min, Blood Pressure: 136/65 mmHg. Eyes Nonicteric. Reactive to light. Ears, Nose, Mouth, and Throat Lips, teeth, and gums WNL.Marland Kitchen Moist mucosa without lesions . Neck supple and nontender. No palpable supraclavicular or cervical  adenopathy. Normal sized without goiter. Respiratory WNL. No retractions.. Cardiovascular Pedal Pulses WNL. No  clubbing, cyanosis or edema. Lymphatic No adneopathy. No adenopathy. No adenopathy. Musculoskeletal Adexa without tenderness or enlargement.. Digits and nails w/o clubbing, cyanosis, infection, petechiae, ischemia, or inflammatory conditions.Marland Kitchen Psychiatric Judgement and insight Intact.. No evidence of depression, anxiety, or agitation.. General Notes: The wound looks clean but has not decreased much in size and the wound edges and the base have been debrided and the next layer of puraply has been applied. Integumentary (Hair, Skin) No suspicious lesions. No crepitus or fluctuance. No peri-wound warmth or erythema. No masses.. Wound #6 status is Open. Original cause of wound was Gradually Appeared. The wound is located on the Left Achilles. The wound measures 1cm length x 1cm width x 0.1cm depth; 0.785cm^2 area and 0.079cm^3 volume. The wound is limited to skin breakdown. There is no tunneling or undermining noted. There is a small amount of serous drainage noted. The wound margin is distinct with the outline attached to the wound base. There is medium (34-66%) granulation within the wound bed. There is a small (1-33%) amount of necrotic tissue within the wound bed including Adherent Slough. The periwound skin appearance exhibited: ADRINNE, CUELLAR ([id number]) Localized Edema, Moist, Hemosiderin Staining. The periwound skin appearance did not exhibit: Callus, Crepitus, Excoriation, Fluctuance, Friable, Induration, Rash, Scarring, Dry/Scaly, Maceration, Atrophie Blanche, Cyanosis, Ecchymosis, Mottled, Pallor, Rubor, Erythema. Periwound temperature was noted as No Abnormality. 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 Dressing changes done in the usual fashion and Mepitel and Steri-Strips applied in place. Come back and see as next week. 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 A skin graft procedure using a bioengineered skin substitute/cellular or tissue based product was performed by Britto, Jackson Latino., MD. Other was applied and secured with Steri-Strips. 4 sq cm of product was utilized and 0 sq cm was wasted. Post Application, mepitel one was applied. A Time Out was conducted prior to the start of the procedure. The procedure was tolerated well with a pain level of 0 throughout and a pain level of 0 following the procedure. Post procedure Diagnosis Wound #6: Same as Pre-Procedure . Plan Wound Cleansing: Wound #6 Left Achilles: Clean wound with Normal Saline. [place], [place] ([id number]) Anesthetic: Wound #6 Left Achilles: Topical Lidocaine 4% cream applied to wound bed prior to debridement Skin Barriers/Peri-Wound Care: Wound #6 Left Achilles: Skin Prep Primary Wound Dressing: Wound #6 Left Achilles: Other: - PUraPly antimicrobial wound matrix Secondary Dressing: Wound #6 Left Achilles: Gauze and Kerlix/Conform Dressing Change Frequency: Wound #6 Left Achilles: Change dressing every week Follow-up Appointments: Wound #6 Left Achilles: Return Appointment in 1 week. We have used the third sample of puraply on Ms Ethier's left posterior ankle. Dressing changes done in the usual fashion and Mepitel and Steri-Strips applied in place. Come back and see as next week. Electronic Signature(s) Signed: 05/17/2015 9:36:34 AM By: Christin Fudge MD, FACS Entered By: Christin Fudge on 05/17/2015 09:36:34 [name] ([id number]) -------------------------------------------------------------------------------- SuperBill Details Patient Name: [name] Date of Service:  05/17/2015 Medical Record Number: [id number] Patient Account Number: [id number] Date of Birth/Sex: 1928-07-30 (81  y.o. 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 of heel and midfoot I70.235 Atherosclerosis of native arteries of right leg with ulceration of other part of foot Facility Procedures CPT4: Description Modifier Quantity Code [id number] - SKIN SUB GRAFT FACE/NK/HF/G 1 ICD-10 Description Diagnosis 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 of native arteries of right leg with ulceration of other part of foot Physician Procedures CPT4: Description Modifier Quantity Code [id number] - WC PHYS SKIN SUB GRAFT FACE/NK/HF/G 1 ICD-10 Description Diagnosis L97.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 I70.244 Atherosclerosis of native arteries of  left leg with ulceration of heel and midfoot I70.235 Atherosclerosis of native arteries of right leg with ulceration of other part of foot Electronic Signature(s) Signed: 05/17/2015 10:59:19 AM By: Christin Fudge MD, FACS Entered By: Christin Fudge on 05/17/2015 10:59:19

## 2015-05-24 ENCOUNTER — Encounter: Payer: Medicare Other | Attending: General Surgery | Admitting: General Surgery

## 2015-05-24 DIAGNOSIS — L97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: Secondary | ICD-10-CM | POA: Diagnosis not present

## 2015-05-24 DIAGNOSIS — I70244 Atherosclerosis of native arteries of left leg with ulceration of heel and midfoot: Secondary | ICD-10-CM | POA: Insufficient documentation

## 2015-05-24 DIAGNOSIS — I70235 Atherosclerosis of native arteries of right leg with ulceration of other part of foot: Secondary | ICD-10-CM | POA: Diagnosis not present

## 2015-05-24 NOTE — Progress Notes (Addendum)
[name] ([id number]) Visit Report for 05/24/2015 Arrival Information Details Patient Name: [name], [name] Date of Service: 05/24/2015 9:30 AM Medical Record Number: [id number] Patient Account Number: [id number] Date of Birth/Sex: March 03, 1928 (81 y.o. Female) Treating RN: Afful, RN, BSN, Velva Harman Primary Care Physician: SYSTEM, PCP Other Clinician: Referring Physician: Constance Goltz Treating Physician/Extender: Frann Rider in Treatment: 59 Visit Information History Since Last Visit Any new allergies or adverse reactions: No Patient Arrived: Wheel Chair Had a fall or experienced change in No Arrival Time: 09:12 activities of daily living that may affect Accompanied By: self risk of falls: Transfer Assistance: None Signs or symptoms of abuse/neglect since last No Patient Identification Verified: Yes visito Secondary Verification Process Yes Hospitalized since last visit: No Completed: Has Dressing in Place as Prescribed: Yes Patient Requires Transmission- No Pain Present Now: No Based Precautions: Patient Has Alerts: Yes Patient Alerts: Patient on Blood Thinner Electronic Signature(s) Signed: 05/24/2015 9:13:17 AM By: Regan Lemming BSN, RN Entered By: Regan Lemming on 05/24/2015 09:13:17 [name] ([id number]) -------------------------------------------------------------------------------- Clinic Level of Care Assessment Details Patient Name: [name] Date of Service: 05/24/2015 9:30 AM Medical Record Number: [id number] Patient Account Number: [id number] Date of Birth/Sex: 07-15-1928 (81 y.o. Female) Treating RN: Afful, RN, BSN, Administrator, sports Primary Care Physician: SYSTEM, PCP Other Clinician: Referring Physician: Constance Goltz Treating Physician/Extender: Frann Rider in Treatment: 36 Clinic Level of Care Assessment Items TOOL 4 Quantity Score []  - Use when only an EandM is performed on FOLLOW-UP visit 0 ASSESSMENTS - Nursing Assessment / Reassessment []  -  Reassessment of Co-morbidities (includes updates in patient status) 0 []  - Reassessment of Adherence to Treatment Plan 0 ASSESSMENTS - Wound and Skin Assessment / Reassessment X - Simple Wound Assessment / Reassessment - one wound 1 5 []  - Complex Wound Assessment / Reassessment - multiple wounds 0 []  - Dermatologic / Skin Assessment (not related to wound area) 0 ASSESSMENTS - Focused Assessment []  - Circumferential Edema Measurements - multi extremities 0 []  - Nutritional Assessment / Counseling / Intervention 0 X - Lower Extremity Assessment (monofilament, tuning fork, pulses) 1 5 []  - Peripheral Arterial Disease Assessment (using hand held doppler) 0 ASSESSMENTS - Ostomy and/or Continence Assessment and Care []  - Incontinence Assessment and Management 0 []  - Ostomy Care Assessment and Management (repouching, etc.) 0 PROCESS - Coordination of Care []  - Simple Patient / Family Education for ongoing care 0 []  - Complex (extensive) Patient / Family Education for ongoing care 0 []  - Staff obtains Programmer, systems, Records, Test Results / Process Orders 0 []  - Staff telephones HHA, Nursing Homes / Clarify orders / etc 0 []  - Routine Transfer to another Facility (non-emergent condition) 0 [place], Zorah ([id number]) []  - Routine Hospital Admission (non-emergent condition) 0 []  - New Admissions / Biomedical engineer / Ordering NPWT, Apligraf, etc. 0 []  - Emergency Hospital Admission (emergent condition) 0 []  - Simple Discharge Coordination 0 []  - Complex (extensive) Discharge Coordination 0 PROCESS - Special Needs []  - Pediatric / Minor Patient Management 0 []  - Isolation Patient Management 0 []  - Hearing / Language / Visual special needs 0 []  - Assessment of Community assistance (transportation, D/C planning, etc.) 0 []  - Additional assistance / Altered mentation 0 []  - Support Surface(s) Assessment (bed, cushion, seat, etc.) 0 INTERVENTIONS - Wound Cleansing / Measurement X - Simple Wound  Cleansing - one wound 1 5 []  - Complex Wound Cleansing - multiple wounds 0 X - Wound Imaging (photographs - any number of wounds) 1 5 []  - Wound  Tracing (instead of photographs) 0 X - Simple Wound Measurement - one wound 1 5 []  - Complex Wound Measurement - multiple wounds 0 INTERVENTIONS - Wound Dressings X - Small Wound Dressing one or multiple wounds 1 10 []  - Medium Wound Dressing one or multiple wounds 0 []  - Large Wound Dressing one or multiple wounds 0 []  - Application of Medications - topical 0 []  - Application of Medications - injection 0 INTERVENTIONS - Miscellaneous []  - External ear exam 0 TYNIESHA, LEH ([id number]) []  - Specimen Collection (cultures, biopsies, blood, body fluids, etc.) 0 []  - Specimen(s) / Culture(s) sent or taken to Lab for analysis 0 []  - Patient Transfer (multiple staff / Harrel Lemon Lift / Similar devices) 0 []  - Simple Staple / Suture removal (25 or less) 0 []  - Complex Staple / Suture removal (26 or more) 0 []  - Hypo / Hyperglycemic Management (close monitor of Blood Glucose) 0 []  - Ankle / Brachial Index (ABI) - do not check if billed separately 0 X - Vital Signs 1 5 Has the patient been seen at the hospital within the last three years: Yes Total Score: 40 Level Of Care: New/Established - Level 2 Electronic Signature(s) Signed: 05/24/2015 9:57:44 AM By: Regan Lemming BSN, RN Entered By: Regan Lemming on 05/24/2015 09:57:43 [name] ([id number]) -------------------------------------------------------------------------------- Encounter Discharge Information Details Patient Name: [name] Date of Service: 05/24/2015 9:30 AM Medical Record Number: [id number] Patient Account Number: [id number] Date of Birth/Sex: 1928/05/01 (81 y.o. Female) Treating RN: Afful, RN, BSN, Velva Harman Primary Care Physician: SYSTEM, PCP Other Clinician: Referring Physician: Constance Goltz Treating Physician/Extender: Frann Rider in Treatment: 23 Encounter Discharge  Information Items Discharge Pain Level: 0 Discharge Condition: Stable Ambulatory Status: Wheelchair Discharge Destination: Nursing Home Transportation: Private Auto Accompanied By: driver in lobby Schedule Follow-up Appointment: No Medication Reconciliation completed and provided to Patient/Care No Mariel Gaudin: Provided on Clinical Summary of Care: 05/24/2015 Form Type Recipient Paper Patient DJ Electronic Signature(s) Signed: 05/24/2015 5:11:41 PM By: Judene Companion MD Previous Signature: 05/24/2015 10:01:32 AM Version By: Regan Lemming BSN, RN Previous Signature: 05/24/2015 9:50:03 AM Version By: Ruthine Dose Entered By: Judene Companion on 05/24/2015 10:07:05 [name] ([id number]) -------------------------------------------------------------------------------- Lower Extremity Assessment Details Patient Name: [name] Date of Service: 05/24/2015 9:30 AM Medical Record Number: [id number] Patient Account Number: [id number] Date of Birth/Sex: 1928-05-24 (81 y.o. Female) Treating RN: Afful, RN, BSN, Velva Harman Primary Care Physician: SYSTEM, PCP Other Clinician: Referring Physician: Constance Goltz Treating Physician/Extender: Frann Rider in Treatment: 36 Vascular Assessment Pulses: Posterior Tibial Dorsalis Pedis Palpable: [Left:No] Doppler: [Left:Monophasic] Extremity colors, hair growth, and conditions: Extremity Color: [Left:Dusky] Hair Growth on Extremity: [Left:No] Temperature of Extremity: [Left:Warm] Capillary Refill: [Left:< 3 seconds] Toe Nail Assessment Left: Right: Thick: Yes Discolored: Yes Deformed: No Improper Length and Hygiene: No Electronic Signature(s) Signed: 05/24/2015 9:15:14 AM By: Regan Lemming BSN, RN Entered By: Regan Lemming on 05/24/2015 09:15:14 [name] ([id number]) -------------------------------------------------------------------------------- Multi Wound Chart Details Patient Name: [name] Date of Service: 05/24/2015 9:30  AM Medical Record Number: [id number] Patient Account Number: [id number] Date of Birth/Sex: Dec 28, 1927 (81 y.o. 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 N/A] Comorbid History: [6:Glaucoma, Asthma,  Hypertension, Peripheral Venous Disease, Type II Diabetes] [N/A:N/A] Date Acquired: [6:09/26/2014] [N/A:N/A] Weeks of Treatment: [6:33] [N/A:N/A] Wound Status: [6:Open] [N/A:N/A] Measurements L x W x D 0.8x0.9x0.1 [N/A:N/A] (cm) Area (cm) : [6:0.565] [N/A:N/A] Volume (cm) : [6:0.057] [N/A:N/A] % Reduction in Area: [6:85.00%] [N/A:N/A] % Reduction in Volume: 97.00% [N/A:N/A] Classification: [6:Partial Thickness] [N/A:N/A] HBO Classification: [6:Grade 1] [N/A:N/A] Exudate Amount: [6:Small] [N/A:N/A] Exudate Type: [6:Serous] [N/A:N/A] Exudate Color: [6:amber] [N/A:N/A] Wound Margin: [6:Distinct, outline attached N/A] Granulation Amount: [6:Medium (34-66%)] [N/A:N/A] Necrotic Amount: [6:Small (1-33%)] [N/A:N/A] Exposed Structures: [6:Fascia: No Fat: No Tendon: No Muscle: No Joint: No] [N/A:N/A] Bone: No Limited to Skin Breakdown Epithelialization: Medium (34-66%) N/A N/A Periwound Skin Texture: Edema: Yes N/A N/A Excoriation: No Induration: No Callus: No Crepitus: No Fluctuance: No Friable: No Rash: No Scarring: No Periwound Skin Moist: Yes N/A N/A Moisture: Maceration: No Dry/Scaly: No Periwound Skin Color: Hemosiderin Staining: Yes N/A N/A Atrophie Blanche: No Cyanosis:  No Ecchymosis: No Erythema: No Mottled: No Pallor: No Rubor: No Temperature: No Abnormality N/A N/A Tenderness on No N/A N/A Palpation: Wound Preparation: Ulcer Cleansing: N/A N/A Rinsed/Irrigated with Saline Topical Anesthetic Applied: Other: lidocaine 4% Treatment Notes Electronic Signature(s) Signed: 05/24/2015 9:53:37 AM By: Regan Lemming BSN, RN Entered By: Regan Lemming on 05/24/2015 09:53:36 ONIYA, LOERA ([id number]) -------------------------------------------------------------------------------- Multi-Disciplinary Care Plan Details Patient Name: [name] Date of Service: 05/24/2015 9:30 AM Medical Record Number: [id number] Patient Account Number: [id number] Date of Birth/Sex: Dec 08, 1927 (81 y.o. Female) Treating RN: Afful, RN, BSN, [employer] Primary Care Physician: SYSTEM, PCP Other Clinician: Referring Physician: Constance Goltz Treating Physician/Extender: Frann Rider in Treatment: 47 Active Inactive Abuse / Safety / Falls / Self Care Management Nursing Diagnoses: Impaired physical mobility Potential for falls Goals: Patient will remain injury free Date Initiated: 09/12/2014 Goal Status: Active Patient/caregiver will verbalize understanding of skin care regimen Date Initiated: 09/12/2014 Goal Status: Active Patient/caregiver will verbalize/demonstrate measures taken to prevent injury and/or falls Date Initiated: 09/12/2014 Goal Status: Active Patient/caregiver will verbalize/demonstrate understanding of what to do in case of emergency Date Initiated: 09/12/2014 Goal Status: Active Interventions: Assess fall risk on admission and as needed Assess: immobility, friction, shearing, incontinence upon admission and as needed Assess impairment of mobility on admission and as needed per policy Provide education on basic hygiene Provide education on fall prevention Provide education on personal and home safety Provide education on safe transfers Treatment  Activities: Education provided on Basic Hygiene : 10/12/2014 Notes: Necrotic Tissue ARICELI, STOBAUGH ([id number]) Nursing Diagnoses: Impaired tissue integrity related to necrotic/devitalized tissue Knowledge deficit related to management of necrotic/devitalized tissue Goals: Necrotic/devitalized tissue will be minimized in the wound bed Date Initiated: 09/12/2014 Goal Status: Active Patient/caregiver will verbalize understanding of reason and process for debridement of necrotic tissue Date Initiated: 09/12/2014 Goal Status: Active Interventions: Assess patient pain level pre-, during and post procedure and prior to discharge Provide education on necrotic tissue and debridement process Treatment Activities: Apply topical anesthetic as ordered : 05/24/2015 Enzymatic debridement : 05/24/2015 Excisional debridement : 05/24/2015 Notes: Orientation to the Wound Care Program Nursing Diagnoses: Knowledge deficit related to the wound healing center program Goals: Patient/caregiver will verbalize understanding of the [facility] Program Date Initiated: 09/12/2014 Goal Status: Active Interventions: Provide education on orientation to the wound center Notes: Pressure Nursing Diagnoses: Knowledge deficit related to causes and risk factors for pressure ulcer development Knowledge deficit related to management of pressures ulcers Potential for impaired tissue integrity related to pressure, friction, moisture, and shear GoalsEMMASOPHIA, SCIOSCIA ([id number]) Patient will remain  free from development of additional pressure ulcers Date Initiated: 09/12/2014 Goal Status: Active Patient will remain free of pressure ulcers Date Initiated: 09/12/2014 Goal Status: Active Patient/caregiver will verbalize risk factors for pressure ulcer development Date Initiated: 09/12/2014 Goal Status: Active Patient/caregiver will verbalize understanding of pressure ulcer management Date Initiated:  09/12/2014 Goal Status: Active Interventions: Assess: immobility, friction, shearing, incontinence upon admission and as needed Assess offloading mechanisms upon admission and as needed Assess potential for pressure ulcer upon admission and as needed Provide education on pressure ulcers Treatment Activities: Pressure reduction/relief device ordered : 05/24/2015 Notes: Wound/Skin Impairment Nursing Diagnoses: Impaired tissue integrity Knowledge deficit related to ulceration/compromised skin integrity Goals: Patient/caregiver will verbalize understanding of skin care regimen Date Initiated: 09/12/2014 Goal Status: Active Ulcer/skin breakdown will heal within 14 weeks Date Initiated: 09/12/2014 Goal Status: Active Interventions: Assess patient/caregiver ability to obtain necessary supplies Assess patient/caregiver ability to perform ulcer/skin care regimen upon admission and as needed Assess ulceration(s) every visit Provide education on ulcer and skin care Treatment Activities: Skin care regimen initiated : 05/24/2015 LYVONNE, NISWONGER ([id number]) Topical wound management initiated : 05/24/2015 Notes: Electronic Signature(s) Signed: 05/24/2015 9:53:27 AM By: Regan Lemming BSN, RN Entered By: Regan Lemming on 05/24/2015 09:53:26 [place], Jalicia ([id number]) -------------------------------------------------------------------------------- Pain Assessment Details Patient Name: [name] Date of Service: 05/24/2015 9:30 AM Medical Record Number: [id number] Patient Account Number: [id number] Date of Birth/Sex: Apr 20, 1928 (81 y.o. Female) Treating RN: Baruch Gouty, RN, BSN, Velva Harman Primary Care Physician: SYSTEM, PCP Other Clinician: Referring Physician: Constance Goltz Treating Physician/Extender: Frann Rider in Treatment: 93 Active Problems Location of Pain Severity and Description of Pain Patient Has Paino No Site Locations Pain Management and Medication Current Pain  Management: Electronic Signature(s) Signed: 05/24/2015 9:13:22 AM By: Regan Lemming BSN, RN Entered By: Regan Lemming on 05/24/2015 09:13:22 [name] ([id number]) -------------------------------------------------------------------------------- Patient/Caregiver Education Details Patient Name: [name] Date of Service: 05/24/2015 9:30 AM Medical Record Number: [id number] Patient Account Number: [id number] Date of Birth/Gender: April 20, 1928 (81 y.o. Female) Treating RN: Afful, RN, BSN, Velva Harman Primary Care Physician: SYSTEM, PCP Other Clinician: Referring Physician: Constance Goltz Treating Physician/Extender: Frann Rider in Treatment: 47 Education Assessment Education Provided To: Patient Education Topics Provided Basic Hygiene: Methods: Explain/Verbal Responses: State content correctly Pressure: Methods: Explain/Verbal Responses: State content correctly Safety: Methods: Explain/Verbal Responses: State content correctly Welcome To [facility]: Methods: Explain/Verbal Responses: State content correctly Wound Debridement: Methods: Explain/Verbal Responses: State content correctly Wound/Skin Impairment: Methods: Explain/Verbal Responses: State content correctly Electronic Signature(s) Signed: 05/24/2015 5:11:41 PM By: Judene Companion MD Previous Signature: 05/24/2015 10:02:26 AM Version By: Regan Lemming BSN, RN Entered By: Judene Companion on 05/24/2015 10:07:18 [name] ([id number]) -------------------------------------------------------------------------------- Wound Assessment Details Patient Name: [name] Date of Service: 05/24/2015 9:30 AM Medical Record Number: [id number] Patient Account Number: [id number] Date of Birth/Sex: 06/07/1928 (81 y.o. Female) Treating RN: Afful, RN, BSN, [employer] Primary Care Physician: SYSTEM, PCP Other Clinician: Referring Physician: Constance Goltz Treating Physician/Extender: Frann Rider in Treatment: 36 Wound  Status Wound Number: 6 Primary Arterial Insufficiency Ulcer Etiology: Wound Location: Left Achilles Wound Open Wounding Event: Gradually Appeared Status: Date Acquired: 09/26/2014 Comorbid Glaucoma, Asthma, Hypertension, Weeks Of Treatment: 33 History: Peripheral Venous Disease, Type II Clustered Wound: No Diabetes Photos Photo Uploaded By: Regan Lemming on 05/24/2015 15:04:28 Wound Measurements Length: (cm) 0.8 Width: (cm) 0.9 Depth: (cm) 0.1 Area: (cm) 0.565 Volume: (cm) 0.057 % Reduction in Area: 85% % Reduction in Volume: 97% Epithelialization: Medium (34-66%) Tunneling: No Undermining: No Wound Description Classification: Partial Thickness  Foul O Diabetic Severity (Wagner): Grade 1 Wound Margin: Distinct, outline attached Exudate Amount: Small Exudate Type: Serous Exudate Color: amber dor After Cleansing: No Wound Bed Granulation Amount: Medium (34-66%) Exposed Structure Necrotic Amount: Small (1-33%) Fascia Exposed: No Necrotic Quality: Adherent Slough Fat Layer Exposed: No [place], Caci ([id number]) Tendon Exposed: No Muscle Exposed: No Joint Exposed: No Bone Exposed: No Limited to Skin Breakdown Periwound Skin Texture Texture Color No Abnormalities Noted: No No Abnormalities Noted: No Callus: No Atrophie Blanche: No Crepitus: No Cyanosis: No Excoriation: No Ecchymosis: No Fluctuance: No Erythema: No Friable: No Hemosiderin Staining: Yes Induration: No Mottled: No Localized Edema: Yes Pallor: No Rash: No Rubor: No Scarring: No Temperature / Pain Moisture Temperature: No Abnormality No Abnormalities Noted: No Dry / Scaly: No Maceration: No Moist: Yes Wound Preparation Ulcer Cleansing: Rinsed/Irrigated with Saline Topical Anesthetic Applied: Other: lidocaine 4%, Treatment Notes Wound #6 (Left Achilles) 1. Cleansed with: Clean wound with Normal Saline 3. Peri-wound Care: Skin Prep 5. Secondary Dressing Applied Gauze and  Kerlix/Conform 7. Secured with Tape Notes Puraply applied Electronic Signature(s) Signed: 05/24/2015 9:21:58 AM By: Regan Lemming BSN, RN Entered By: Regan Lemming on 05/24/2015 09:21:58 [name] ([id number]) -------------------------------------------------------------------------------- Vitals Details Patient Name: [name] Date of Service: 05/24/2015 9:30 AM Medical Record Number: [id number] Patient Account Number: [id number] Date of Birth/Sex: 07/23/1928 (81 y.o. Female) Treating RN: Afful, RN, BSN, [employer] Primary Care Physician: SYSTEM, PCP Other Clinician: Referring Physician: Constance Goltz Treating Physician/Extender: Frann Rider in Treatment: 36 Vital Signs Time Taken: 09:18 Temperature (F): 98.7 Height (in): 64 Pulse (bpm): 72 Weight (lbs): 180 Respiratory Rate (breaths/min): 17 Body Mass Index (BMI): 30.9 Blood Pressure (mmHg): 145/50 Reference Range: 80 - 120 mg / dl Electronic Signature(s) Signed: 05/24/2015 9:19:21 AM By: Regan Lemming BSN, RN Entered By: Regan Lemming on 05/24/2015 09:19:21

## 2015-05-24 NOTE — Progress Notes (Signed)
seeiheal 

## 2015-05-29 NOTE — Progress Notes (Signed)
ZIHAN, GARST ([id number]) Visit Report for 05/24/2015 Chief Complaint Document Details Patient Name: [name], [name] Date of Service: 05/24/2015 9:30 AM Medical Record Number: [id number] Patient Account Number: [id number] Date of Birth/Sex: 23-Dec-1927 (81 y.o. Female) Treating RN: Afful, RN, BSN, Velva Harman Primary Care Physician: SYSTEM, PCP Other Clinician: Referring Physician: Constance Goltz Treating Physician/Extender: Frann Rider in Treatment: 55 Information Obtained from: Patient Chief Complaint Patient presents to the wound care center today with an open arterial ulcer on the left foot big toe and lateral heal. She also has some superficial ulcerations on the second and third toe dorsum. she has no family member with her today and she is a very poor historian and cannot give a proper history. As stated that she walks around with a walker. Electronic Signature(s) Signed: 05/24/2015 5:11:41 PM By: Judene Companion MD Signed: 05/29/2015 8:06:26 AM By: Christin Fudge MD, FACS Entered By: Judene Companion on 05/24/2015 10:03:52 [name] ([id number]) -------------------------------------------------------------------------------- Cellular or Tissue Based Product Details Patient Name: [name] Date of Service: 05/24/2015 9:30 AM Medical Record Number: [id number] Patient Account Number: [id number] Date of Birth/Sex: 1928/06/27 (81 y.o. Female) Treating RN: Afful, RN, BSN, Velva Harman Primary Care Physician: SYSTEM, PCP Other Clinician: Referring Physician: Constance Goltz Treating Physician/Extender: Frann Rider in Treatment: 36 Cellular or Tissue Based Wound #6 Left Achilles Product Type Applied to: Performed By: Physician Christin Fudge, MD Cellular or Tissue Based Other Product Type: Time-Out Taken: Yes Location: genitalia / hands / feet / multiple digits Wound Size (sq cm): 0.72 Product Size (sq cm): 4 Waste Size (sq cm): 0 Amount of Product Applied (sq cm): 4 Lot #:  [id number] Expiration Date: 03/11/2016 Fenestrated: No Reconstituted: No Secured: Yes Secured With: Steri-Strips Dressing Applied: Yes Primary Dressing: mepitel one Procedural Pain: 0 Post Procedural Pain: 0 Response to Treatment: Procedure was tolerated well Post Procedure Diagnosis Same as Pre-procedure Electronic Signature(s) Signed: 05/24/2015 10:00:28 AM By: Regan Lemming BSN, RN Entered By: Regan Lemming on 05/24/2015 10:00:28 [name] ([id number]) -------------------------------------------------------------------------------- HPI Details Patient Name: [name] Date of Service: 05/24/2015 9:30 AM Medical Record Number: [id number] Patient Account Number: [id number] Date of Birth/Sex: [birth year] (81 y.o. Female) Treating RN: Baruch Gouty, RN, BSN, Velva Harman Primary Care Physician: SYSTEM, PCP Other Clinician: Referring Physician: Constance Goltz Treating Physician/Extender: Frann Rider in Treatment: 51 History of Present Illness Location: left heel Quality: Patient reports experiencing a dull pain to affected area(s). Severity: Patient states wound are getting worse. Duration: Patient states that they are not certain how long the wound has been present. Timing: Pain in wound is Intermittent (comes and goes Context: The wound appeared gradually over time Associated Signs and Symptoms: Patient reports having difficulty standing for long periods. HPI Description: This 81 year old patient is a poor historian and comes from a nursing home. Does not have any family members with her. Says she has a ulcer on the lateral part of her left foot and some new ones are there on her right foot too. She is unable to say how long she's had these. she does walk with a walker and this is limited most of the day. Does not recall if she has had any vascular studies done. I have reviewed an x-ray done of the left foot which basically shows osteoporosis but no evidence of fracture  dislocation or osteomyelitis. 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 She seems to be doing better overall. 10/19/14 -- I understand she has gone to the vascular surgery and lab yesterday and has had all her tests done yesterday. She is doing fine otherwise and has had no fresh issues. 10/26/2014 she seems to be doing well and has no fresh problems and seems much brighter. 10/12/14 -- Taking her antibiotic and continues to do her dressings locally and she is helped by her skilled nursing. She seems to be doing fine and has no fresh issues. 10/19/14 -- she is doing fine and has had no fresh issues. She says her son to go for her vascular workup yesterday and she has been called back after 3 months. we will try and gather these reports to review what they said. 10/26/14 --I have been able to review notes from the vascular surgery office and these were dated from 10/18/2014. He patient was on her first postop visit and had Doppler ultrasounds done of her arteries. She had more than 50% stenosis of the right superficial femoral artery and more than 50% stenosis in the left tibioperoneal trunk. She was status post a left lower extremity angiogram with angioplasty of the peroneal and left superficial femoral arteries for a nonhealing left foot and ankle ulceration. note is made of the fact that the ABIs had not changed in the last month since her previous visit. The opinion of her provider was that she did not need any intervention at this time and they had done everything they could at the present time. They would continue on Plavix and antibiotics and see her back in 3 months for a another arterial duplex study of the lower extremities. JOYCELIN, MCNATT ([id number]) 11/02/2014 -- she has spoken to her caregivers about coming for HBO 5  times per week for 6-8 weeks and they are agreeable about this and she is willing to undergo hyperbaric oxygen therapy. 11/16/2014 she has been worked up with a EKG and chest x-ray and these were within normal limits. As far as the investigations go she is ready for HBOT. we are awaiting some insurance clearance so that she can start on her hyperbaric oxygen therapy. 11/30/2014 she tolerated hyperbaric oxygen therapy fine and there were no issues with her blood glucose levels. As noted she is not a diabetic and most of her fingerstick blood glucose levels are inaccurate because of the Raynaud's phenomena. She normally has to have your lobe sticks to get any random glucose levels. I have also requested her primary care does a venous blood glucose draw to check her glucose level. 12/21/2014 -- she is tolerating hyperbaric oxygen very well and has overall made a lot of progress. Her left leg is looking much better. 01/11/2015 - No new complaints today. New ulceration on the dorsum of the right second toe noted today on physical exam. Patient unaware. No significant pain. No fever or chills. No significant drainage. Offloading with sage boots at night. Significantly improved with HBO. 01/19/2015 - the patient has had a x-ray of the right foot done at the nursing home but we still do not have the report and we are awaiting this result. She is otherwise doing fine with her general health. 01/25/2015 -- X-ray of her right foot showed no acute fracture, dislocation, bony infection or osteomyelitis. The was some soft tissue swelling and osteopenia seen. 02/15/2015 -- due to some insurance issues we could not get her epifix and we are working with her company to get her this. She  is doing fine otherwise and has no fresh issues. 04/04/2015 -- she had a follow-up with the vascular surgeon and he seemed to be pleased with her progress and has had no problems with her vascularity. 05/04/2015 -- the wound on  the left posterior ankle had stalled and after talking to the [employer] we have obtained her samples of PuraPly and we will use this on a weekly basis. 05/10/2015 - here for the second application of puraply. 05/17/2015 -- she is here for her third application of puraply Electronic Signature(s) Signed: 05/24/2015 5:11:41 PM By: Judene Companion MD Signed: 05/29/2015 8:06:26 AM By: Christin Fudge MD, FACS Entered By: Judene Companion on 05/24/2015 10:04:03 [name] ([id number]) -------------------------------------------------------------------------------- Physical Exam Details Patient Name: [name] Date of Service: 05/24/2015 9:30 AM Medical Record Number: [id number] Patient Account Number: [id number] Date of Birth/Sex: 03/06/1928 (81 y.o. Female) Treating RN: Baruch Gouty, RN, BSN, Velva Harman Primary Care Physician: SYSTEM, PCP Other Clinician: Referring Physician: Constance Goltz Treating Physician/Extender: Frann Rider in Treatment: 36 Electronic Signature(s) Signed: 05/24/2015 5:11:41 PM By: Judene Companion MD Signed: 05/29/2015 8:06:26 AM By: Christin Fudge MD, FACS Entered By: Judene Companion on 05/24/2015 10:04:10 [name] ([id number]) -------------------------------------------------------------------------------- Physician Orders Details Patient Name: [name] Date of Service: 05/24/2015 9:30 AM Medical Record Number: [id number] Patient Account Number: [id number] Date of Birth/Sex: Apr 11, 1928 (81 y.o. Female) Treating RN: Baruch Gouty, RN, BSN, Velva Harman Primary Care Physician: SYSTEM, PCP Other Clinician: Referring Physician: Constance Goltz Treating Physician/Extender: Frann Rider in Treatment: 76 Verbal / Phone Orders: Yes Clinician: Afful, RN, BSN, Rita Read Back and Verified: Yes Diagnosis Coding Wound Cleansing Wound #6 Left Achilles o Clean wound with Normal Saline. Anesthetic Wound #6 Left Achilles o Topical Lidocaine 4% cream applied to wound  bed prior to debridement Skin Barriers/Peri-Wound Care Wound #6 Left Achilles o Skin Prep Primary Wound Dressing Wound #6 Left Achilles o Other: - PUraPly antimicrobial wound matrix Secondary Dressing Wound #6 Left Achilles o Gauze and Kerlix/Conform Dressing Change Frequency Wound #6 Left Achilles o Change dressing every week Follow-up Appointments Wound #6 Left Achilles o Return Appointment in 1 week. Electronic Signature(s) Signed: 05/24/2015 9:53:56 AM By: Regan Lemming BSN, RN Signed: 05/29/2015 8:06:26 AM By: Christin Fudge MD, FACS Entered By: Regan Lemming on 05/24/2015 09:53:55 LOVELL, ROMANCHUK ([id number]) Arthur, Phyllis ([id number]) -------------------------------------------------------------------------------- Problem List Details Patient Name: [name] Date of Service: 05/24/2015 9:30 AM Medical Record Number: [id number] Patient Account Number: [id number] Date of Birth/Sex: [birth year] (81 y.o. 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 09/12/2014 Yes of heel and midfoot I70.235 Atherosclerosis of native arteries of right leg with 09/12/2014 Yes ulceration of other part of foot Inactive Problems Resolved Problems ICD-10 Code Description Active Date Resolved Date L97.412 Non-pressure chronic ulcer of right heel and midfoot with 10/26/2014 10/26/2014 fat layer exposed S91.104A Unspecified open wound of right lesser toe(s) without 01/11/2015 01/11/2015 damage to nail, initial encounter Electronic Signature(s) Signed: 05/24/2015 5:11:41 PM By: Judene Companion MD Signed:  05/29/2015 8:06:26 AM By: Christin Fudge MD, FACS Entered By: Judene Companion on 05/24/2015 10:03:43 [name] ([id number]) -------------------------------------------------------------------------------- Progress Note Details Patient Name: [name] Date of Service: 05/24/2015 9:30 AM Medical Record Number: [id number] Patient Account Number: [id number]  Date of Birth/Sex: 1927-10-19 (80 y.o. Female) Treating RN: Afful, RN, BSN, Velva Harman Primary Care Physician: SYSTEM, PCP Other Clinician: Referring Physician: Constance Goltz Treating Physician/Extender: Frann Rider in Treatment: 62 Subjective Chief Complaint Information obtained from Patient Patient presents to the wound care center today with an open arterial ulcer on the left foot big toe and lateral heal. She also has some superficial ulcerations on the second and third toe dorsum. she has no family member with her today and she is a very poor historian and cannot give a proper history. As stated that she walks around with a walker. History of Present Illness (HPI) The following HPI elements were documented for the patient's wound: Location: left heel Quality: Patient reports experiencing a dull pain to affected area(s). Severity: Patient states wound are getting worse. Duration: Patient states that they are not certain how long the wound has been present. Timing: Pain in wound is Intermittent (comes and goes Context: The wound appeared gradually over time Associated Signs and Symptoms: Patient reports having difficulty standing for long periods. This 81 year old patient is a poor historian and comes from a nursing home. Does not have any family members with her. Says she has a ulcer on the lateral part of her left foot and some new ones are there on her right foot too. She is unable to say how long she's had these. she does walk with a walker and this is limited most of the day. Does not recall if she has had any  vascular studies done. I have reviewed an x-ray done of the left foot which basically shows osteoporosis but no evidence of fracture dislocation or osteomyelitis. 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 She seems to be doing better overall. 10/19/14 -- I understand she has gone to the vascular surgery and lab yesterday and has had all her tests done yesterday. She is doing fine otherwise and has had no fresh issues. 10/26/2014 she seems to be doing well and has no fresh problems and seems much brighter. 10/12/14 -- Taking her antibiotic and continues to do her dressings locally and she is helped by her skilled nursing. She seems to be doing fine and has no fresh issues. 10/19/14 -- she is doing fine and has had no fresh issues. She says her son to go for her vascular workup yesterday and she has been called back after 3 months. we will try and gather these reports to review what CAITIN, TROMP ([id number]) they said. 10/26/14 --I have been able to review notes from the vascular surgery office and these were dated from 10/18/2014. He patient was on her first postop visit and had Doppler ultrasounds done of her arteries. She had more than 50% stenosis of the right superficial femoral artery and more than 50% stenosis in the left tibioperoneal trunk. She was status post a left lower extremity angiogram with angioplasty of the peroneal and left superficial femoral arteries for a nonhealing left foot and ankle ulceration. note is made of the fact that the ABIs had not changed in the last month since her previous visit. The opinion of her provider was that she did not need any intervention at this time and they had done everything they could at the present time. They would continue on Plavix and antibiotics and see her back in 3  months  for a another arterial duplex study of the lower extremities. 11/02/2014 -- she has spoken to her caregivers about coming for HBO 5 times per week for 6-8 weeks and they are agreeable about this and she is willing to undergo hyperbaric oxygen therapy. 11/16/2014 she has been worked up with a EKG and chest x-ray and these were within normal limits. As far as the investigations go she is ready for HBOT. we are awaiting some insurance clearance so that she can start on her hyperbaric oxygen therapy. 11/30/2014 she tolerated hyperbaric oxygen therapy fine and there were no issues with her blood glucose levels. As noted she is not a diabetic and most of her fingerstick blood glucose levels are inaccurate because of the Raynaud's phenomena. She normally has to have your lobe sticks to get any random glucose levels. I have also requested her primary care does a venous blood glucose draw to check her glucose level. 12/21/2014 -- she is tolerating hyperbaric oxygen very well and has overall made a lot of progress. Her left leg is looking much better. 01/11/2015 - No new complaints today. New ulceration on the dorsum of the right second toe noted today on physical exam. Patient unaware. No significant pain. No fever or chills. No significant drainage. Offloading with sage boots at night. Significantly improved with HBO. 01/19/2015 - the patient has had a x-ray of the right foot done at the nursing home but we still do not have the report and we are awaiting this result. She is otherwise doing fine with her general health. 01/25/2015 -- X-ray of her right foot showed no acute fracture, dislocation, bony infection or osteomyelitis. The was some soft tissue swelling and osteopenia seen. 02/15/2015 -- due to some insurance issues we could not get her epifix and we are working with her company to get her this. She is doing fine otherwise and has no fresh issues. 04/04/2015 -- she had a follow-up with the  vascular surgeon and he seemed to be pleased with her progress and has had no problems with her vascularity. 05/04/2015 -- the wound on the left posterior ankle had stalled and after talking to the [employer] we have obtained her samples of PuraPly and we will use this on a weekly basis. 05/10/2015 - here for the second application of puraply. 05/17/2015 -- she is here for her third application of puraply [place], [place] ([id number]) Objective Constitutional Vitals Time Taken: 9:18 AM, Height: 64 in, Weight: 180 lbs, BMI: 30.9, Temperature: 98.7 F, Pulse: 72 bpm, Respiratory Rate: 17 breaths/min, Blood Pressure: 145/50 mmHg. Integumentary (Hair, Skin) Wound #6 status is Open. Original cause of wound was Gradually Appeared. The wound is located on the Left Achilles. The wound measures 0.8cm length x 0.9cm width x 0.1cm depth; 0.565cm^2 area and 0.057cm^3 volume. The wound is limited to skin breakdown. There is no tunneling or undermining noted. There is a small amount of serous drainage noted. The wound margin is distinct with the outline attached to the wound base. There is medium (34-66%) granulation within the wound bed. There is a small (1-33%) amount of necrotic tissue within the wound bed including Adherent Slough. The periwound skin appearance exhibited: Localized Edema, Moist, Hemosiderin Staining. The periwound skin appearance did not exhibit: Callus, Crepitus, Excoriation, Fluctuance, Friable, Induration, Rash, Scarring, Dry/Scaly, Maceration, Atrophie Blanche, Cyanosis, Ecchymosis, Mottled, Pallor, Rubor, Erythema. Periwound temperature was noted as No Abnormality. 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 A skin graft procedure using a bioengineered skin substitute/cellular or tissue based product was performed by Christin Fudge, MD. Other was applied and secured with Steri-Strips. 4 sq cm of product was utilized and 0 sq cm was wasted. Post Application, mepitel one was applied. A Time Out was conducted prior to the start of the procedure. The procedure was tolerated well with a pain level of 0 throughout and a pain level of 0 following the procedure. Post procedure Diagnosis Wound #6: Same as Pre-Procedure KHADJAH, GLADNEY ([id number]) . Applied Puraply to left heel ulcer. ! cm size Plan Wound Cleansing: Wound #6 Left Achilles: Clean wound with Normal Saline. Anesthetic: Wound #6 Left Achilles: Topical Lidocaine 4% cream applied to wound bed prior to debridement Skin Barriers/Peri-Wound Care: Wound #6 Left Achilles: Skin Prep Primary Wound Dressing: Wound #6 Left Achilles: Other: - PUraPly antimicrobial wound matrix Secondary Dressing: Wound #6 Left Achilles: Gauze and Kerlix/Conform Dressing Change Frequency: Wound #6 Left Achilles: Change dressing every week Follow-up Appointments: Wound #6 Left Achilles: Return Appointment in 1 week. Follow-Up Appointments: A Patient Clinical Summary of Care was provided to DJ Electronic Signature(s) Signed: 05/24/2015 5:11:41 PM By: Judene Companion MD Signed: 05/29/2015 8:06:26 AM By: Christin Fudge MD, FACS Entered By: Judene Companion on 05/24/2015 10:05:55 CAYLEA, VALLADOLID ([id number]) [place], [place] ([id number]) -------------------------------------------------------------------------------- SuperBill Details Patient Name: [name] Date of Service: 05/24/2015 Medical Record Number: [id number] Patient Account Number: [id number] Date of Birth/Sex: 1927-10-24 (81 y.o. 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 of heel and midfoot I70.235 Atherosclerosis of native arteries of right leg with ulceration of other part of foot Facility Procedures CPT4: Description Modifier Quantity Code [id number] - WOUND CARE VISIT-LEV 2 EST PT 1 CPT4: [id number] - SKIN SUB GRAFT FACE/NK/HF/G 1 ICD-10 Description Diagnosis 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 of heel and midfoot Physician Procedures CPT4: Description Modifier Quantity Code [id number] - WC PHYS LEVEL 3 - EST PT 1 ICD-10 Description Diagnosis L97.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 CPT4: [id number] - WC PHYS SKIN SUB GRAFT FACE/NK/HF/G 1 ICD-10 Description Diagnosis 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

## 2015-05-31 ENCOUNTER — Encounter: Payer: Medicare Other | Admitting: Surgery

## 2015-05-31 DIAGNOSIS — L97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: Secondary | ICD-10-CM | POA: Diagnosis not present

## 2015-05-31 NOTE — Progress Notes (Addendum)
TIFFINE, PETROFF ([id number]) Visit Report for 05/31/2015 Chief Complaint Document Details Patient Name: [name], [name] Date of Service: 05/31/2015 9:30 AM Medical Record Number: [id number] Patient Account Number: [id number] Date of Birth/Sex: Feb 17, 1928 (81 y.o. Female) Treating RN: Afful, RN, BSN, Velva Harman Primary Care Physician: SYSTEM, PCP Other Clinician: Referring Physician: Constance Goltz Treating Physician/Extender: Frann Rider in Treatment: 30 Information Obtained from: Patient Chief Complaint Patient presents to the wound care center today with an open arterial ulcer on the left foot big toe and lateral heal. She also has some superficial ulcerations on the second and third toe dorsum. she has no family member with her today and she is a very poor historian and cannot give a proper history. As stated that she walks around with a walker. Electronic Signature(s) Signed: 05/31/2015 10:27:56 AM By: Christin Fudge MD, FACS Entered By: Christin Fudge on 05/31/2015 10:27:56 [name] ([id number]) -------------------------------------------------------------------------------- Cellular or Tissue Based Product Details Patient Name: [name] Date of Service: 05/31/2015 9:30 AM Medical Record Number: [id number] Patient Account Number: [id number] Date of Birth/Sex: 03/23/1928 (81 y.o. Female) Treating RN: Afful, RN, BSN, Velva Harman Primary Care Physician: SYSTEM, PCP Other Clinician: Referring Physician: Constance Goltz Treating Physician/Extender: Frann Rider in Treatment: 37 Cellular or Tissue Based Wound #6 Left Achilles Product Type Applied to: Performed By: Physician Christin Fudge, MD Cellular or Tissue Based Other Product Type: Time-Out Taken: Yes Location: genitalia / hands / feet / multiple digits Wound Size (sq cm): 1.1 Product Size (sq cm): 4 Waste Size (sq cm): 0 Amount of Product Applied (sq cm): 4 Lot #: [id number] Expiration Date:  03/18/2016 Fenestrated: No Reconstituted: Yes Solution Type: saline Solution Amount: 19ml Lot #: [id number] Solution Expiration 12/16/2016 Date: Secured: Yes Secured With: Steri-Strips Dressing Applied: Yes Primary Dressing: mepitel one Procedural Pain: 0 Post Procedural Pain: 0 Response to Treatment: Procedure was tolerated well Post Procedure Diagnosis Same as Pre-procedure Electronic Signature(s) Signed: 05/31/2015 10:27:49 AM By: Christin Fudge MD, FACS Previous Signature: 05/31/2015 10:19:16 AM Version By: Regan Lemming BSN, RN Entered By: Christin Fudge on 05/31/2015 10:27:49 [name] ([id number]) -------------------------------------------------------------------------------- HPI Details Patient Name: [name] Date of Service: 05/31/2015 9:30 AM Medical Record Number: [id number] Patient Account Number: [id number] Date of Birth/Sex: June 18, 1928 (81 y.o. Female) Treating RN: Baruch Gouty, RN, BSN, Velva Harman Primary Care Physician: SYSTEM, PCP Other Clinician: Referring Physician: Constance Goltz Treating Physician/Extender: Frann Rider in Treatment: 37 History of Present Illness Location: left heel Quality: Patient reports experiencing a dull pain to affected area(s). Severity: Patient states wound are getting worse. Duration: Patient states that they are not certain how long the wound has been present. Timing: Pain in wound is Intermittent (comes and goes Context: The wound appeared gradually over time Associated Signs and Symptoms: Patient reports having difficulty standing for long periods. HPI Description: This 81 year old patient is a poor historian and comes from a nursing home. Does not have any family members with her. Says she has a ulcer on the lateral part of her left foot and some new ones are there on her right foot too. She is unable to say how long she's had these. she does walk with a walker and this is limited most of the day. Does not recall if she has had  any vascular studies done. I have reviewed an x-ray done of the left foot which basically shows osteoporosis but no evidence of fracture dislocation or osteomyelitis. 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 She seems to be doing better overall. 10/19/14 -- I understand she has gone to the vascular surgery and lab yesterday and has had all her tests done yesterday. She is doing fine otherwise and has had no fresh issues. 10/26/2014 she seems to be doing well and has no fresh problems and seems much brighter. 10/12/14 -- Taking her antibiotic and continues to do her dressings locally and she is helped by her skilled nursing. She seems to be doing fine and has no fresh issues. 10/19/14 -- she is doing fine and has had no fresh issues. She says her son to go for her vascular workup yesterday and she has been called back after 3 months. we will try and gather these reports to review what they said. 10/26/14 --I have been able to review notes from the vascular surgery office and these were dated from 10/18/2014. He patient was on her first postop visit and had Doppler ultrasounds done of her arteries. She had more than 50% stenosis of the right superficial femoral artery and more than 50% stenosis in the left tibioperoneal trunk. She was status post a left lower extremity angiogram with angioplasty of the peroneal and left superficial femoral arteries for a nonhealing left foot and ankle ulceration. note is made of the fact that the ABIs had not changed in the last month since her previous visit. The opinion of her provider was that she did not need any intervention at this time and they had done everything they could at the present time. They would continue on Plavix and antibiotics and see her back in 3 months for a another arterial  duplex study of the lower extremities. JIMISHA, ASKELAND ([id number]) 11/02/2014 -- she has spoken to her caregivers about coming for HBO 5 times per week for 6-8 weeks and they are agreeable about this and she is willing to undergo hyperbaric oxygen therapy. 11/16/2014 she has been worked up with a EKG and chest x-ray and these were within normal limits. As far as the investigations go she is ready for HBOT. we are awaiting some insurance clearance so that she can start on her hyperbaric oxygen therapy. 11/30/2014 she tolerated hyperbaric oxygen therapy fine and there were no issues with her blood glucose levels. As noted she is not a diabetic and most of her fingerstick blood glucose levels are inaccurate because of the Raynaud's phenomena. She normally has to have your lobe sticks to get any random glucose levels. I have also requested her primary care does a venous blood glucose draw to check her glucose level. 12/21/2014 -- she is tolerating hyperbaric oxygen very well and has overall made a lot of progress. Her left leg is looking much better. 01/11/2015 - No new complaints today. New ulceration on the dorsum of the right second toe noted today on physical exam. Patient unaware. No significant pain. No fever or chills. No significant drainage. Offloading with sage boots at night. Significantly improved with HBO. 01/19/2015 - the patient has had a x-ray of the right foot done at the nursing home but we still do not have the report and we are awaiting this result. She is otherwise doing fine with her general health. 01/25/2015 -- X-ray of her right foot showed no acute fracture, dislocation, bony infection or osteomyelitis. The was some soft tissue swelling and osteopenia seen. 02/15/2015 -- due to some insurance issues we could not  get her epifix and we are working with her company to get her this. She is doing fine otherwise and has no fresh issues. 04/04/2015 -- she had a follow-up with the  vascular surgeon and he seemed to be pleased with her progress and has had no problems with her vascularity. 05/04/2015 -- the wound on the left posterior ankle had stalled and after talking to the [employer] we have obtained her samples of PuraPly and we will use this on a weekly basis. 05/10/2015 - .here for the second application of puraply. 05/17/2015 -- she is here for her third application of puraply [id number] ---here for the fifth application of puraply Electronic Signature(s) Signed: 05/31/2015 10:28:48 AM By: Christin Fudge MD, FACS Entered By: Christin Fudge on 05/31/2015 10:28:46 [name] ([id number]) -------------------------------------------------------------------------------- Physical Exam Details Patient Name: [name] Date of Service: 05/31/2015 9:30 AM Medical Record Number: [id number] Patient Account Number: [id number] Date of Birth/Sex: Oct 20, 1927 (81 y.o. Female) Treating RN: Baruch Gouty, RN, BSN, Velva Harman Primary Care Physician: SYSTEM, PCP Other Clinician: Referring Physician: Constance Goltz Treating Physician/Extender: Frann Rider in Treatment: 37 Constitutional . Pulse regular. Respirations normal and unlabored. Afebrile. . Eyes Nonicteric. Reactive to light. Ears, Nose, Mouth, and Throat Lips, teeth, and gums WNL.Marland Kitchen Moist mucosa without lesions . Neck supple and nontender. No palpable supraclavicular or cervical adenopathy. Normal sized without goiter. Respiratory WNL. No retractions.. Cardiovascular Pedal Pulses WNL. No clubbing, cyanosis or edema. Lymphatic No adneopathy. No adenopathy. No adenopathy. Musculoskeletal Adexa without tenderness or enlargement.. Digits and nails w/o clubbing, cyanosis, infection, petechiae, ischemia, or inflammatory conditions.. Integumentary (Hair, Skin) No suspicious lesions. No crepitus or fluctuance. No peri-wound warmth or erythema. No masses.Marland Kitchen Psychiatric Judgement and insight Intact.. No  evidence of depression, anxiety, or agitation.. Notes the left heel has some slough which will be sharply debrided with a curette and the basal be prepared for the next application of puraply Electronic Signature(s) Signed: 05/31/2015 10:29:57 AM By: Christin Fudge MD, FACS Entered By: Christin Fudge on 05/31/2015 10:29:57 [name] ([id number]) -------------------------------------------------------------------------------- Physician Orders Details Patient Name: [name] Date of Service: 05/31/2015 9:30 AM Medical Record Number: [id number] Patient Account Number: [id number] Date of Birth/Sex: [birth year] (81 y.o. 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 Anesthetic Wound #6 Left Achilles o Topical Lidocaine 4% cream applied to wound bed prior to debridement Skin Barriers/Peri-Wound Care Wound #6 Left Achilles o Skin Prep Primary Wound Dressing Wound #6 Left Achilles o Other: - PUraPly antimicrobial wound matrix Secondary Dressing Wound #6 Left Achilles o Gauze and Kerlix/Conform Dressing Change Frequency Wound #6 Left Achilles o Change dressing every week Follow-up Appointments Wound #6 Left Achilles o Return Appointment in 1 week. [place], Maja ([id number]) Edema Control Wound #6 Left Achilles o 2 Layer Lite Compression  System - Left Lower Extremity - lightly wrapped. Kerlix and Event organiser) Signed: 05/31/2015 10:16:44 AM By: Regan Lemming BSN, RN Signed: 05/31/2015 3:40:39 PM By: Christin Fudge MD, FACS Previous Signature:  05/31/2015 10:08:23 AM Version By: Regan Lemming BSN, RN Entered By: Regan Lemming on 05/31/2015 10:16:43 [name] ([id number]) -------------------------------------------------------------------------------- Problem List Details Patient Name: [name] Date of Service: 05/31/2015 9:30 AM Medical Record Number: [id number] Patient Account Number: [id number] Date of Birth/Sex: Oct 23, 1927 (81 y.o. Female) Treating RN: Afful, RN, BSN, Velva Harman Primary Care Physician: SYSTEM, PCP Other Clinician: Referring Physician: Constance Goltz Treating Physician/Extender: Frann Rider in Treatment: 37 Active Problems ICD-10 Encounter Code Description Active Date Diagnosis L97.422 Non-pressure chronic ulcer of left heel and midfoot with fat 09/12/2014 Yes layer exposed 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 09/12/2014 Yes of heel and midfoot I70.235 Atherosclerosis of native arteries of right leg with 09/12/2014 Yes ulceration of other part of foot Inactive Problems Resolved Problems ICD-10 Code Description Active Date Resolved Date L97.412 Non-pressure chronic ulcer of right heel and midfoot with 10/26/2014 10/26/2014 fat layer exposed S91.104A Unspecified open wound of right lesser toe(s) without 01/11/2015 01/11/2015 damage to nail, initial encounter Electronic Signature(s) Signed: 05/31/2015 10:27:38 AM By: Christin Fudge MD, FACS Previous Signature: 05/31/2015 10:05:24 AM Version By: Christin Fudge MD, FACS Entered By: Christin Fudge on 05/31/2015 10:27:38 [name] ([id number]) -------------------------------------------------------------------------------- Progress Note Details Patient Name: [name] Date of Service:  05/31/2015 9:30 AM Medical Record Number: [id number] Patient Account Number: [id number] Date of Birth/Sex: [birth year] (81 y.o. Female) Treating RN: Afful, RN, BSN, Velva Harman Primary Care Physician: SYSTEM, PCP Other Clinician: Referring Physician: Constance Goltz Treating Physician/Extender: Frann Rider in Treatment: 71 Subjective Chief Complaint Information obtained from Patient Patient presents to the wound care center today with an open arterial ulcer on the left foot big toe and lateral heal. She also has some superficial ulcerations on the second and third toe dorsum. she has no family member with her today and she is a very poor historian and cannot give a proper history. As stated that she walks around with a walker. History of Present Illness (HPI) The following HPI elements were documented for the patient's wound: Location: left heel Quality: Patient reports experiencing a dull pain to affected area(s). Severity: Patient states wound are getting worse. Duration: Patient states that they are not certain how long the wound has been present. Timing: Pain in wound is Intermittent (comes and goes Context: The wound appeared gradually over time Associated Signs and Symptoms: Patient reports having difficulty standing for long periods. This 81 year old patient is a poor historian and comes from a nursing home. Does not have any family members with her. Says she has a ulcer on the lateral part of her left foot and some new ones are there on her right foot too. She is unable to say how long she's had these. she does walk with a walker and this is limited most of the day. Does not recall if she has had any vascular studies done. I have reviewed an x-ray done of the left foot which basically shows osteoporosis but no evidence of fracture dislocation or osteomyelitis. her culture report is also back and she has grown a MRSA which is sensitive to Tetracycline 10/12/14 -- She seems more  alert today and says that she has already scheduled an appointment with the vascular surgeons. She seems to be doing better overall. 10/19/14 -- I understand she has gone to the vascular surgery and lab yesterday and has had all her tests done yesterday. She is doing fine otherwise and has had no fresh issues. 10/26/2014 she seems to be doing well and has no fresh  problems and seems much brighter. 10/12/14 -- Taking her antibiotic and continues to do her dressings locally and she is helped by her skilled nursing. She seems to be doing fine and has no fresh issues. 10/19/14 -- she is doing fine and has had no fresh issues. She says her son to go for her vascular workup yesterday and she has been called back after 3 months. we will try and gather these reports to review what PAYGE, SCHMIDTBERGER ([id number]) they said. 10/26/14 --I have been able to review notes from the vascular surgery office and these were dated from 10/18/2014. He patient was on her first postop visit and had Doppler ultrasounds done of her arteries. She had more than 50% stenosis of the right superficial femoral artery and more than 50% stenosis in the left tibioperoneal trunk. She was status post a left lower extremity angiogram with angioplasty of the peroneal and left superficial femoral arteries for a nonhealing left foot and ankle ulceration. note is made of the fact that the ABIs had not changed in the last month since her previous visit. The opinion of her provider was that she did not need any intervention at this time and they had done everything they could at the present time. They would continue on Plavix and antibiotics and see her back in 3 months for a another arterial duplex study of the lower extremities. 11/02/2014 -- she has spoken to her caregivers about coming for HBO 5 times per week for 6-8 weeks and they are agreeable about this and she is willing to undergo hyperbaric oxygen therapy. 11/16/2014 she has been  worked up with a EKG and chest x-ray and these were within normal limits. As far as the investigations go she is ready for HBOT. we are awaiting some insurance clearance so that she can start on her hyperbaric oxygen therapy. 11/30/2014 she tolerated hyperbaric oxygen therapy fine and there were no issues with her blood glucose levels. As noted she is not a diabetic and most of her fingerstick blood glucose levels are inaccurate because of the Raynaud's phenomena. She normally has to have your lobe sticks to get any random glucose levels. I have also requested her primary care does a venous blood glucose draw to check her glucose level. 12/21/2014 -- she is tolerating hyperbaric oxygen very well and has overall made a lot of progress. Her left leg is looking much better. 01/11/2015 - No new complaints today. New ulceration on the dorsum of the right second toe noted today on physical exam. Patient unaware. No significant pain. No fever or chills. No significant drainage. Offloading with sage boots at night. Significantly improved with HBO. 01/19/2015 - the patient has had a x-ray of the right foot done at the nursing home but we still do not have the report and we are awaiting this result. She is otherwise doing fine with her general health. 01/25/2015 -- X-ray of her right foot showed no acute fracture, dislocation, bony infection or osteomyelitis. The was some soft tissue swelling and osteopenia seen. 02/15/2015 -- due to some insurance issues we could not get her epifix and we are working with her company to get her this. She is doing fine otherwise and has no fresh issues. 04/04/2015 -- she had a follow-up with the vascular surgeon and he seemed to be pleased with her progress and has had no problems with her vascularity. 05/04/2015 -- the wound on the left posterior ankle had stalled and after talking to the [employer]  we have obtained her samples of PuraPly and we will use this  on a weekly basis. 05/10/2015 - .here for the second application of puraply. 05/17/2015 -- she is here for her third application of puraply [id number] ---here for the fifth application of puraply [place], [place] ([id number]) Objective Constitutional Pulse regular. Respirations normal and unlabored. Afebrile. Vitals Time Taken: 9:44 AM, Height: 64 in, Weight: 180 lbs, BMI: 30.9, Temperature: 98.2 F, Pulse: 70 bpm, Respiratory Rate: 17 breaths/min, Blood Pressure: 132/54 mmHg. Eyes Nonicteric. Reactive to light. Ears, Nose, Mouth, and Throat Lips, teeth, and gums WNL.Marland Kitchen Moist mucosa without lesions . Neck supple and nontender. No palpable supraclavicular or cervical adenopathy. Normal sized without goiter. Respiratory WNL. No retractions.. Cardiovascular Pedal Pulses WNL. No clubbing, cyanosis or edema. Lymphatic No adneopathy. No adenopathy. No adenopathy. Musculoskeletal Adexa without tenderness or enlargement.. Digits and nails w/o clubbing, cyanosis, infection, petechiae, ischemia, or inflammatory conditions.Marland Kitchen Psychiatric Judgement and insight Intact.. No evidence of depression, anxiety, or agitation.. General Notes: the left heel has some slough which will be sharply debrided with a curette and the basal be prepared for the next application of puraply Integumentary (Hair, Skin) No suspicious lesions. No crepitus or fluctuance. No peri-wound warmth or erythema. No masses.. Wound #6 status is Open. Original cause of wound was Gradually Appeared. The wound is located on the Left Achilles. The wound measures 1cm length x 1.1cm width x 0.1cm depth; 0.864cm^2 area and 0.086cm^3 volume. The wound is limited to skin breakdown. There is no tunneling or undermining noted. There is a small amount of serous drainage noted. The wound margin is distinct with the outline attached to the wound base. There is medium (34-66%) granulation within the wound bed. There is a small (1-33%) amount of  necrotic tissue within the wound bed including Adherent Slough. The periwound skin appearance TAMERAH, RICKARDS ([id number]) exhibited: Localized Edema, Moist, Hemosiderin Staining. The periwound skin appearance did not exhibit: Callus, Crepitus, Excoriation, Fluctuance, Friable, Induration, Rash, Scarring, Dry/Scaly, Maceration, Atrophie Blanche, Cyanosis, Ecchymosis, Mottled, Pallor, Rubor, Erythema. Periwound temperature was noted as No Abnormality. 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 He will also use a Kerlix and Coban light compression to help with her edema. She will come back and see me next week. 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 A skin graft procedure using a bioengineered skin substitute/cellular or tissue based product was performed by Christin Fudge, MD. Other was applied and secured with Steri-Strips. 4 sq cm of product was utilized and 0 sq cm was wasted. Post Application, mepitel one was applied. A Time Out was conducted prior to the start of the procedure. The procedure was tolerated well with a pain level of 0 throughout and a pain level of 0 following the procedure. Post procedure Diagnosis Wound #6: Same as Pre-Procedure . Plan Wound Cleansing: Wound #6 Left Achilles: Clean wound with Normal Saline. Anesthetic: LOIS, GOODRICK ([id number]) Wound #6 Left Achilles: Topical Lidocaine 4% cream applied to wound bed prior to debridement Skin Barriers/Peri-Wound Care: Wound #6 Left Achilles: Skin Prep Primary Wound Dressing: Wound #6 Left  Achilles: Other: - PUraPly  antimicrobial wound matrix Secondary Dressing: Wound #6 Left Achilles: Gauze and Kerlix/Conform Dressing Change Frequency: Wound #6 Left Achilles: Change dressing every week Follow-up Appointments: Wound #6 Left Achilles: Return Appointment in 1 week. Edema Control: Wound #6 Left Achilles: 2 Layer Lite Compression System - Left Lower Extremity - lightly wrapped. Kerlix and coban The patient is so slow to progress with the Puraply and we will continue applications as permitted every week. He will also use a Kerlix and Coban light compression to help with her edema. She will come back and see me next week. Electronic Signature(s) Signed: 05/31/2015 10:31:03 AM By: Christin Fudge MD, FACS Entered By: Christin Fudge on 05/31/2015 10:31:03 [name] ([id number]) -------------------------------------------------------------------------------- Super[employer] Details Patient Name: [name] Date of Service: 05/31/2015 Medical Record Number: [id number] Patient Account Number: [id number] Date of Birth/Sex: 1928/01/29 (81 y.o. 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 I70.235 Atherosclerosis of native arteries of right leg with ulceration of other part of foot Facility Procedures CPT4: Description Modifier Quantity Code [id number] - SKIN SUB GRAFT FACE/NK/HF/G 1 ICD-10 Description Diagnosis 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  of native arteries of right leg with ulceration of other part of foot Physician Procedures CPT4: Description Modifier Quantity Code [id number] - WC PHYS SKIN SUB GRAFT FACE/NK/HF/G 1 ICD-10 Description Diagnosis L97.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 I70.244 Atherosclerosis of native arteries of  left leg with ulceration of heel and midfoot I70.235 Atherosclerosis of native arteries of right leg with ulceration of other part of foot Electronic Signature(s) Signed: 05/31/2015 10:31:15 AM By: Christin Fudge MD, FACS Entered By: Christin Fudge on 05/31/2015 10:31:14

## 2015-05-31 NOTE — Progress Notes (Signed)
MICALA, MILLINER ([id number]) Visit Report for 05/31/2015 Arrival Information Details Patient Name: [name], [name] Date of Service: 05/31/2015 9:30 AM Medical Record Number: [id number] Patient Account Number: [id number] Date of Birth/Sex: February 17, 1928 (81 y.o. Female) Treating RN: Afful, RN, BSN, [employer]va Harman Primary Care Physician: SYSTEM, PCP Other Clinician: Referring Physician: Constance Goltz Treating Physician/Extender: Frann Rider in Treatment: 44 Visit Information History Since Last Visit Added or deleted any medications: No Patient Arrived: Wheel Chair Any new allergies or adverse reactions: No Arrival Time: 09:39 Signs or symptoms of abuse/neglect since last No Accompanied By: self visito Transfer Assistance: None Hospitalized since last visit: No Patient Identification Verified: Yes Has Dressing in Place as Prescribed: Yes Secondary Verification Process Yes Pain Present Now: No Completed: Patient Requires Transmission- No Based Precautions: Patient Has Alerts: Yes Patient Alerts: Patient on Blood Thinner Electronic Signature(s) Signed: 05/31/2015 9:39:37 AM By: Regan Lemming BSN, RN Entered By: Regan Lemming on 05/31/2015 09:39:36 [name] ([id number]) -------------------------------------------------------------------------------- Encounter Discharge Information Details Patient Name: [name] Date of Service: 05/31/2015 9:30 AM Medical Record Number: [id number] Patient Account Number: [id number] Date of Birth/Sex: 23-Dec-1927 (81 y.o. Female) Treating RN: Afful, RN, BSN, Velva Harman Primary Care Physician: SYSTEM, PCP Other Clinician: Referring Physician: Constance Goltz Treating Physician/Extender: Frann Rider in Treatment: 31 Encounter Discharge Information Items Discharge Pain Level: 0 Discharge Condition: Stable Ambulatory Status: Ambulatory Nursing Discharge Destination: Home Transportation: Other driver in Accompanied By: lobby Schedule  Follow-up Appointment: No Medication Reconciliation completed No and provided to Patient/Care Zyanne Schumm: Clinical Summary of Care: Electronic Signature(s) Signed: 05/31/2015 10:20:13 AM By: Regan Lemming BSN, RN Entered By: Regan Lemming on 05/31/2015 10:20:13 [name] ([id number]) -------------------------------------------------------------------------------- Lower Extremity Assessment Details Patient Name: [name] Date of Service: 05/31/2015 9:30 AM Medical Record Number: [id number] Patient Account Number: [id number] Date of Birth/Sex: 1927-11-12 (81 y.o. Female) Treating RN: Afful, RN, BSN, Velva Harman Primary Care Physician: SYSTEM, PCP Other Clinician: Referring Physician: Constance Goltz Treating Physician/Extender: Frann Rider in Treatment: 37 Vascular Assessment Pulses: Posterior Tibial Dorsalis Pedis Palpable: [Left:No] Extremity colors, hair growth, and conditions: Extremity Color: [Left:Normal] Hair Growth on Extremity: [Left:No] Temperature of Extremity: [Left:Warm] Capillary Refill: [Left:< 3 seconds] Electronic Signature(s) Signed: 05/31/2015 9:42:24 AM By: Regan Lemming BSN, RN Entered By: Regan Lemming on 05/31/2015 09:42:23 [name] ([id number]) -------------------------------------------------------------------------------- Multi Wound Chart Details Patient Name: [name] Date of Service: 05/31/2015 9:30 AM Medical Record Number: [id number] Patient Account Number: [id number] Date of Birth/Sex: 1928-06-12 (81 y.o. Female) Treating RN: Baruch Gouty, RN, BSN, Velva Harman Primary Care Physician: SYSTEM, PCP Other Clinician: Referring Physician: Constance Goltz Treating Physician/Extender: Frann Rider in Treatment: 37 Vital Signs Height(in): 64 Pulse(bpm): 70 Weight(lbs): 180 Blood Pressure 132/54 (mmHg): Body Mass Index(BMI): 31 Temperature(F): 98.2 Respiratory Rate 17 (breaths/min): Photos: [6:No Photos] [N/A:N/A] Wound Location: [6:Left  Achilles] [N/A:N/A] Wounding Event: [6:Gradually Appeared] [N/A:N/A] Primary Etiology: [6:Arterial Insufficiency Ulcer N/A] Comorbid History: [6:Glaucoma, Asthma, Hypertension, Peripheral Venous Disease, Type II Diabetes] [N/A:N/A] Date Acquired: [6:09/26/2014] [N/A:N/A] Weeks of Treatment: [6:34] [N/A:N/A] Wound Status: [6:Open] [N/A:N/A] Measurements L x W x D 1x1.1x0.1 [N/A:N/A] (cm) Area (cm) : [6:0.864] [N/A:N/A] Volume (cm) : [6:0.086] [N/A:N/A] % Reduction in Area: [6:77.10%] [N/A:N/A] % Reduction in Volume: 95.40% [N/A:N/A] Classification: [6:Partial Thickness] [N/A:N/A] HBO Classification: [6:Grade 1] [N/A:N/A] Exudate Amount: [6:Small] [N/A:N/A] Exudate Type: [6:Serous] [N/A:N/A] Exudate Color: [6:amber] [N/A:N/A] Wound Margin: [6:Distinct, outline attached N/A] Granulation Amount: [6:Medium (34-66%)] [N/A:N/A] Necrotic Amount: [6:Small (1-33%)] [N/A:N/A] Exposed Structures: [6:Fascia: No Fat: No Tendon: No Muscle: No Joint: No] [N/A:N/A] Bone: No Limited to Skin Breakdown  Epithelialization: Medium (34-66%) N/A N/A Periwound Skin Texture: Edema: Yes N/A N/A Excoriation: No Induration: No Callus: No Crepitus: No Fluctuance: No Friable: No Rash: No Scarring: No Periwound Skin Moist: Yes N/A N/A Moisture: Maceration: No Dry/Scaly: No Periwound Skin Color: Hemosiderin Staining: Yes N/A N/A Atrophie Blanche: No Cyanosis: No Ecchymosis: No Erythema: No Mottled: No Pallor: No Rubor: No Temperature: No Abnormality N/A N/A Tenderness on No N/A N/A Palpation: Wound Preparation: Ulcer Cleansing: N/A N/A Rinsed/Irrigated with Saline Topical Anesthetic Applied: Other: lidocaine 4% Treatment Notes Electronic Signature(s) Signed: 05/31/2015 10:07:44 AM By: Regan Lemming BSN, RN Entered By: Regan Lemming on 05/31/2015 10:07:43 [name] ([id number]) -------------------------------------------------------------------------------- Multi-Disciplinary Care  Plan Details Patient Name: [name] Date of Service: 05/31/2015 9:30 AM Medical Record Number: [id number] Patient Account Number: [id number] Date of Birth/Sex: 1927-10-22 (81 y.o. Female) Treating RN: Afful, RN, BSN, [employer] Primary Care Physician: SYSTEM, PCP Other Clinician: Referring Physician: Constance Goltz Treating Physician/Extender: Frann Rider in Treatment: 94 Active Inactive Abuse / Safety / Falls / Self Care Management Nursing Diagnoses: Impaired physical mobility Potential for falls Goals: Patient will remain injury free Date Initiated: 09/12/2014 Goal Status: Active Patient/caregiver will verbalize understanding of skin care regimen Date Initiated: 09/12/2014 Goal Status: Active Patient/caregiver will verbalize/demonstrate measures taken to prevent injury and/or falls Date Initiated: 09/12/2014 Goal Status: Active Patient/caregiver will verbalize/demonstrate understanding of what to do in case of emergency Date Initiated: 09/12/2014 Goal Status: Active Interventions: Assess fall risk on admission and as needed Assess: immobility, friction, shearing, incontinence upon admission and as needed Assess impairment of mobility on admission and as needed per policy Provide education on basic hygiene Provide education on fall prevention Provide education on personal and home safety Provide education on safe transfers Treatment Activities: Education provided on Basic Hygiene : 10/12/2014 Notes: Necrotic Tissue CHALENE, CERVENKA ([id number]) Nursing Diagnoses: Impaired tissue integrity related to necrotic/devitalized tissue Knowledge deficit related to management of necrotic/devitalized tissue Goals: Necrotic/devitalized tissue will be minimized in the wound bed Date Initiated: 09/12/2014 Goal Status: Active Patient/caregiver will verbalize understanding of reason and process for debridement of necrotic tissue Date Initiated: 09/12/2014 Goal Status:  Active Interventions: Assess patient pain level pre-, during and post procedure and prior to discharge Provide education on necrotic tissue and debridement process Treatment Activities: Apply topical anesthetic as ordered : 05/31/2015 Enzymatic debridement : 05/31/2015 Excisional debridement : 05/31/2015 Notes: Orientation to the Wound Care Program Nursing Diagnoses: Knowledge deficit related to the wound healing center program Goals: Patient/caregiver will verbalize understanding of the [place] Program Date Initiated: 09/12/2014 Goal Status: Active Interventions: Provide education on orientation to the wound center Notes: Pressure Nursing Diagnoses: Knowledge deficit related to causes and risk factors for pressure ulcer development Knowledge deficit related to management of pressures ulcers Potential for impaired tissue integrity related to pressure, friction, moisture, and shear GoalsSHASTELYN, PEMBLE ([id number]) Patient will remain free from development of additional pressure ulcers Date Initiated: 09/12/2014 Goal Status: Active Patient will remain free of pressure ulcers Date Initiated: 09/12/2014 Goal Status: Active Patient/caregiver will verbalize risk factors for pressure ulcer development Date Initiated: 09/12/2014 Goal Status: Active Patient/caregiver will verbalize understanding of pressure ulcer management Date Initiated: 09/12/2014 Goal Status: Active Interventions: Assess: immobility, friction, shearing, incontinence upon admission and as needed Assess offloading mechanisms upon admission and as needed Assess potential for pressure ulcer upon admission and as needed Provide education on pressure ulcers Treatment Activities: Pressure reduction/relief device ordered : 05/31/2015 Notes: Wound/Skin Impairment Nursing Diagnoses: Impaired tissue integrity Knowledge deficit related  to ulceration/compromised skin integrity Goals: Patient/caregiver will  verbalize understanding of skin care regimen Date Initiated: 09/12/2014 Goal Status: Active Ulcer/skin breakdown will heal within 14 weeks Date Initiated: 09/12/2014 Goal Status: Active Interventions: Assess patient/caregiver ability to obtain necessary supplies Assess patient/caregiver ability to perform ulcer/skin care regimen upon admission and as needed Assess ulceration(s) every visit Provide education on ulcer and skin care Treatment Activities: Skin care regimen initiated : 05/31/2015 MERRISA, NEASE ([id number]) Topical wound management initiated : 05/31/2015 Notes: Electronic Signature(s) Signed: 05/31/2015 10:07:36 AM By: Regan Lemming BSN, RN Entered By: Regan Lemming on 05/31/2015 10:07:35 [name] ([id number]) -------------------------------------------------------------------------------- Pain Assessment Details Patient Name: [name] Date of Service: 05/31/2015 9:30 AM Medical Record Number: [id number] Patient Account Number: [id number] Date of Birth/Sex: 10-11-1927 (81 y.o. Female) Treating RN: Baruch Gouty, RN, BSN, Velva Harman Primary Care Physician: SYSTEM, PCP Other Clinician: Referring Physician: Constance Goltz Treating Physician/Extender: Frann Rider in Treatment: 37 Active Problems Location of Pain Severity and Description of Pain Patient Has Paino No Site Locations Pain Management and Medication Current Pain Management: Electronic Signature(s) Signed: 05/31/2015 9:39:49 AM By: Regan Lemming BSN, RN Entered By: Regan Lemming on 05/31/2015 09:39:49 [name] ([id number]) -------------------------------------------------------------------------------- Patient/Caregiver Education Details Patient Name: [name] Date of Service: 05/31/2015 9:30 AM Medical Record Number: [id number] Patient Account Number: [id number] Date of Birth/Gender: September 06, 1927 (81 y.o. Female) Treating RN: Baruch Gouty, RN, BSN, Velva Harman Primary Care Physician: SYSTEM, PCP Other  Clinician: Referring Physician: Constance Goltz Treating Physician/Extender: Frann Rider in Treatment: 27 Education Assessment Education Provided To: Patient Education Topics Provided Basic Hygiene: Methods: Explain/Verbal Responses: State content correctly Pressure: Methods: Explain/Verbal Responses: State content correctly Safety: Methods: Explain/Verbal Responses: State content correctly Electronic Signature(s) Signed: 05/31/2015 10:20:31 AM By: Regan Lemming BSN, RN Entered By: Regan Lemming on 05/31/2015 10:20:31 [name] ([id number]) -------------------------------------------------------------------------------- Wound Assessment Details Patient Name: [name] Date of Service: 05/31/2015 9:30 AM Medical Record Number: [id number] Patient Account Number: [id number] Date of Birth/Sex: 1928-06-07 (81 y.o. Female) Treating RN: Afful, RN, BSN, [employer] Primary Care Physician: SYSTEM, PCP Other Clinician: Referring Physician: Constance Goltz Treating Physician/Extender: Frann Rider in Treatment: 37 Wound Status Wound Number: 6 Primary Arterial Insufficiency Ulcer Etiology: Wound Location: Left Achilles Wound Open Wounding Event: Gradually Appeared Status: Date Acquired: 09/26/2014 Comorbid Glaucoma, Asthma, Hypertension, Weeks Of Treatment: 34 History: Peripheral Venous Disease, Type II Clustered Wound: No Diabetes Wound Measurements Length: (cm) 1 Width: (cm) 1.1 Depth: (cm) 0.1 Area: (cm) 0.864 Volume: (cm) 0.086 % Reduction in Area: 77.1% % Reduction in Volume: 95.4% Epithelialization: Medium (34-66%) Tunneling: No Undermining: No Wound Description Classification: Partial Thickness Foul O Diabetic Severity (Wagner): Grade 1 Wound Margin: Distinct, outline attached Exudate Amount: Small Exudate Type: Serous Exudate Color: amber dor After Cleansing: No Wound Bed Granulation Amount: Medium (34-66%) Exposed Structure Necrotic Amount:  Small (1-33%) Fascia Exposed: No Necrotic Quality: Adherent Slough Fat Layer Exposed: No Tendon Exposed: No Muscle Exposed: No Joint Exposed: No Bone Exposed: No Limited to Skin Breakdown Periwound Skin Texture Texture Color No Abnormalities Noted: No No Abnormalities Noted: No Callus: No Atrophie Blanche: No Crepitus: No Cyanosis: No MORNING, Haila ([id number]) Excoriation: No Ecchymosis: No Fluctuance: No Erythema: No Friable: No Hemosiderin Staining: Yes Induration: No Mottled: No Localized Edema: Yes Pallor: No Rash: No Rubor: No Scarring: No Temperature / Pain Moisture Temperature: No Abnormality No Abnormalities Noted: No Dry / Scaly: No Maceration: No Moist: Yes Wound Preparation Ulcer Cleansing: Rinsed/Irrigated with Saline Topical Anesthetic Applied: Other: lidocaine 4%, Treatment Notes Wound #6 (Left Achilles)  1. Cleansed with: Clean wound with Normal Saline 3. Peri-wound Care: Skin Prep 4. Dressing Applied: Other dressing (specify in notes) 5. Secondary Dressing Applied Gauze and Kerlix/Conform 7. Secured with 2 Layer Lite Compression System - Left Lower Extremity Notes Puraply applied Electronic Signature(s) Signed: 05/31/2015 9:47:24 AM By: Regan Lemming BSN, RN Entered By: Regan Lemming on 05/31/2015 09:47:24 [name] ([id number]) -------------------------------------------------------------------------------- Vitals Details Patient Name: [name] Date of Service: 05/31/2015 9:30 AM Medical Record Number: [id number] Patient Account Number: [id number] Date of Birth/Sex: 1927/12/01 (81 y.o. Female) Treating RN: Afful, RN, BSN, Leonard Primary Care Physician: SYSTEM, PCP Other Clinician: Referring Physician: Constance Goltz Treating Physician/Extender: Frann Rider in Treatment: 37 Vital Signs Time Taken: 09:44 Temperature (F): 98.2 Height (in): 64 Pulse (bpm): 70 Weight (lbs): 180 Respiratory Rate (breaths/min):  17 Body Mass Index (BMI): 30.9 Blood Pressure (mmHg): 132/54 Reference Range: 80 - 120 mg / dl Electronic Signature(s) Signed: 05/31/2015 9:45:01 AM By: Regan Lemming BSN, RN Entered By: Regan Lemming on 05/31/2015 09:45:01

## 2015-06-08 ENCOUNTER — Encounter (HOSPITAL_BASED_OUTPATIENT_CLINIC_OR_DEPARTMENT_OTHER): Payer: Medicare Other | Admitting: General Surgery

## 2015-06-08 DIAGNOSIS — L97322 Non-pressure chronic ulcer of left ankle with fat layer exposed: Secondary | ICD-10-CM

## 2015-06-08 DIAGNOSIS — L97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: Secondary | ICD-10-CM | POA: Diagnosis not present

## 2015-06-08 NOTE — Progress Notes (Signed)
seeiheal 

## 2015-06-08 NOTE — Progress Notes (Addendum)
[name], [name] ([id number]) Visit Report for 06/08/2015 Arrival Information Details Patient Name: [name], [name] Date of Service: 06/08/2015 9:30 AM Medical Record Number: [id number] Patient Account Number: [id number] Date of Birth/Sex: 14-Apr-1928 (81 y.o. Female) Treating RN: Afful, RN, BSN, Velva Harman Primary Care Physician: SYSTEM, PCP Other Clinician: Referring Physician: Constance Goltz Treating Physician/Extender: Frann Rider in Treatment: 53 Visit Information History Since Last Visit Any new allergies or adverse reactions: No Patient Arrived: Wheel Chair Had a fall or experienced change in No Arrival Time: 09:26 activities of daily living that may affect Accompanied By: self with driver at risk of falls: lobby Has Dressing in Place as Prescribed: Yes Transfer Assistance: None Has Compression in Place as Prescribed: Yes Patient Identification Verified: Yes Pain Present Now: No Secondary Verification Process Yes Completed: Patient Requires Transmission- No Based Precautions: Patient Has Alerts: Yes Patient Alerts: Patient on Blood Thinner Electronic Signature(s) Signed: 06/08/2015 9:27:22 AM By: Regan Lemming BSN, RN Entered By: Regan Lemming on 06/08/2015 09:27:22 [name] ([id number]) -------------------------------------------------------------------------------- Encounter Discharge Information Details Patient Name: [name] Date of Service: 06/08/2015 9:30 AM Medical Record Number: [id number] Patient Account Number: [id number] Date of Birth/Sex: 1928/06/28 (81 y.o. Female) Treating RN: Montey Hora Primary Care Physician: SYSTEM, PCP Other Clinician: Referring Physician: Constance Goltz Treating Physician/Extender: Frann Rider in Treatment: 66 Encounter Discharge Information Items Schedule Follow-up Appointment: No Medication Reconciliation completed and provided to Patient/Care No [name]: Provided on Clinical Summary of  Care: 06/08/2015 Form Type Recipient Paper Patient DJ Electronic Signature(s) Signed: 06/08/2015 11:02:39 AM By: Judene Companion MD Previous Signature: 06/08/2015 10:02:12 AM Version By: Ruthine Dose Entered By: Judene Companion on 06/08/2015 11:02:39 [name] ([id number]) -------------------------------------------------------------------------------- Lower Extremity Assessment Details Patient Name: [name] Date of Service: 06/08/2015 9:30 AM Medical Record Number: [id number] Patient Account Number: [id number] Date of Birth/Sex: 1928/07/29 (81 y.o. Female) Treating RN: Afful, RN, BSN, Velva Harman Primary Care Physician: SYSTEM, PCP Other Clinician: Referring Physician: Constance Goltz Treating Physician/Extender: Frann Rider in Treatment: 40 Vascular Assessment Pulses: Posterior Tibial Dorsalis Pedis Palpable: [Left:No] Doppler: [Left:Monophasic] Extremity colors, hair growth, and conditions: Extremity Color: [Left:Normal] Hair Growth on Extremity: [Left:Yes] Capillary Refill: [Left:< 3 seconds] Electronic Signature(s) Signed: 06/08/2015 9:27:54 AM By: Regan Lemming BSN, RN Entered By: Regan Lemming on 06/08/2015 09:27:54 [name] ([id number]) -------------------------------------------------------------------------------- Multi Wound Chart Details Patient Name: [name] Date of Service: 06/08/2015 9:30 AM Medical Record Number: [id number] Patient Account Number: [id number] Date of Birth/Sex: 1927-09-04 (81 y.o. 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/A] Comorbid History: [6:Glaucoma, Asthma, Hypertension, Peripheral Venous Disease, Type II Diabetes] [N/A:N/A] Date Acquired: [6:09/26/2014] [N/A:N/A] Weeks of Treatment: [6:35] [N/A:N/A] Wound Status: [6:Open] [N/A:N/A] Measurements L x W x D 0.9x1x0.1 [N/A:N/A] (cm) Area (cm) : [6:0.707] [N/A:N/A] Volume (cm) : [6:0.071] [N/A:N/A] % Reduction in Area: [6:81.20%] [N/A:N/A] % Reduction in Volume: 96.20% [N/A:N/A] Classification: [6:Partial Thickness] [N/A:N/A] HBO Classification: [6:Grade 1] [N/A:N/A] Exudate Amount: [6:Small] [N/A:N/A] Exudate Type: [6:Serous] [N/A:N/A] Exudate Color: [6:amber] [N/A:N/A] Wound Margin: [6:Distinct, outline attached N/A] Granulation Amount: [6:Medium (34-66%)] [N/A:N/A] Necrotic Amount: [6:Small (1-33%)] [N/A:N/A] Exposed Structures: [6:Fascia: No Fat: No Tendon: No Muscle: No Joint: No] [N/A:N/A] Bone: No Limited to Skin Breakdown Epithelialization:  Medium (34-66%) N/A N/A Periwound Skin Texture: Edema: Yes N/A N/A Excoriation: No Induration: No Callus: No Crepitus: No Fluctuance: No Friable: No Rash: No Scarring: No Periwound Skin Moist: Yes N/A N/A Moisture: Maceration: No Dry/Scaly: No Periwound Skin Color: Hemosiderin Staining: Yes N/A N/A Atrophie Blanche: No Cyanosis: No Ecchymosis: No Erythema: No Mottled: No Pallor: No Rubor: No Temperature: No Abnormality N/A N/A Tenderness on No N/A N/A Palpation: Wound Preparation: Ulcer Cleansing: Other: N/A N/A Soap and water Topical Anesthetic Applied: Other: lidocaine 4% Treatment Notes Electronic Signature(s) Signed: 06/08/2015 3:16:02 PM By: Montey Hora Entered By: Montey Hora on 06/08/2015 09:43:53 [name] ([id number]) -------------------------------------------------------------------------------- Multi-Disciplinary Care Plan Details Patient  Name: [name] Date of Service: 06/08/2015 9:30 AM Medical Record Number: [id number] Patient Account Number: [id number] Date of Birth/Sex: [birth year] (81 y.o. Female) Treating RN: Montey Hora Primary Care Physician: SYSTEM, PCP Other Clinician: Referring Physician: Constance Goltz Treating Physician/Extender: Frann Rider in Treatment: 71 Active Inactive Abuse / Safety / Falls / Self Care Management Nursing Diagnoses: Impaired physical mobility Potential for falls Goals: Patient will remain injury free Date Initiated: 09/12/2014 Goal Status: Active Patient/caregiver will verbalize understanding of skin care regimen Date Initiated: 09/12/2014 Goal Status: Active Patient/caregiver will verbalize/demonstrate measures taken to prevent injury and/or falls Date Initiated: 09/12/2014 Goal Status: Active Patient/caregiver will verbalize/demonstrate understanding of what to do in case of emergency Date Initiated: 09/12/2014 Goal Status: Active Interventions: Assess fall risk on admission and as needed Assess: immobility, friction, shearing, incontinence upon admission and as needed Assess impairment of mobility on admission and as needed per policy Provide education on basic hygiene Provide education on fall prevention Provide education on personal and home safety Provide education on safe transfers Treatment Activities: Education provided on Basic Hygiene : 10/12/2014 Notes: Necrotic Tissue [name], [name] ([id number]) Nursing Diagnoses: Impaired tissue integrity related to necrotic/devitalized tissue Knowledge deficit related to management of necrotic/devitalized tissue Goals: Necrotic/devitalized tissue will be minimized in the wound bed Date Initiated: 09/12/2014 Goal Status: Active Patient/caregiver will verbalize understanding of reason and process for debridement of necrotic tissue Date Initiated: 09/12/2014 Goal Status: Active Interventions: Assess patient pain  level pre-, during and post procedure and prior to discharge Provide education on necrotic tissue and debridement process Treatment Activities: Apply topical anesthetic as ordered : 06/08/2015 Enzymatic debridement : 06/08/2015 Excisional debridement : 06/08/2015 Notes: Orientation to the Wound Care Program Nursing Diagnoses: Knowledge deficit related to the wound healing center program Goals: Patient/caregiver will verbalize understanding of the [place] Program Date Initiated: 09/12/2014 Goal Status: Active Interventions: Provide education on orientation to the wound center Notes: Pressure Nursing Diagnoses: Knowledge deficit related to causes and risk factors for pressure ulcer development Knowledge deficit related to management of pressures ulcers Potential for impaired tissue integrity related to pressure, friction, moisture, and shear GoalsMONIFA, [name] ([id number]) Patient will remain free from development of additional pressure ulcers Date Initiated: 09/12/2014 Goal Status: Active Patient will remain free of pressure ulcers Date Initiated: 09/12/2014 Goal Status: Active Patient/caregiver will verbalize risk factors for pressure ulcer development Date Initiated: 09/12/2014 Goal Status: Active Patient/caregiver will verbalize understanding of pressure ulcer management Date Initiated: 09/12/2014 Goal Status: Active Interventions: Assess: immobility, friction, shearing, incontinence upon admission and as needed Assess offloading mechanisms upon admission and as needed Assess potential for pressure ulcer upon admission and as needed Provide education on pressure ulcers Treatment Activities: Pressure reduction/relief device ordered : 06/08/2015 Notes: Wound/Skin Impairment Nursing Diagnoses: Impaired tissue integrity Knowledge deficit related to ulceration/compromised skin integrity  Goals: Patient/caregiver will verbalize understanding of skin care  regimen Date Initiated: 09/12/2014 Goal Status: Active Ulcer/skin breakdown will heal within 14 weeks Date Initiated: 09/12/2014 Goal Status: Active Interventions: Assess patient/caregiver ability to obtain necessary supplies Assess patient/caregiver ability to perform ulcer/skin care regimen upon admission and as needed Assess ulceration(s) every visit Provide education on ulcer and skin care Treatment Activities: Skin care regimen initiated : 06/08/2015 [name], [name] ([id number]) Topical wound management initiated : 06/08/2015 Notes: Electronic Signature(s) Signed: 06/08/2015 3:16:02 PM By: Montey Hora Entered By: Montey Hora on 06/08/2015 09:43:45 [name], [name] ([id number]) -------------------------------------------------------------------------------- Pain Assessment Details Patient Name: [name] Date of Service: 06/08/2015 9:30 AM Medical Record Number: [id number] Patient Account Number: [id number] Date of Birth/Sex: 01/25/1928 (81 y.o. Female) Treating RN: Baruch Gouty, RN, BSN, Velva Harman Primary Care Physician: SYSTEM, PCP Other Clinician: Referring Physician: Constance Goltz Treating Physician/Extender: Frann Rider in Treatment: 76 Active Problems Location of Pain Severity and Description of Pain Patient Has Paino No Site Locations Pain Management and Medication Current Pain Management: Electronic Signature(s) Signed: 06/08/2015 9:27:33 AM By: Regan Lemming BSN, RN Entered By: Regan Lemming on 06/08/2015 09:27:33 [name] ([id number]) -------------------------------------------------------------------------------- Patient/Caregiver Education Details Patient Name: [name] Date of Service: 06/08/2015 9:30 AM Medical Record Number: [id number] Patient Account Number: [id number] Date of Birth/Gender: [birth year] (81 y.o. Female) Treating RN: Montey Hora Primary Care Physician: SYSTEM, PCP Other Clinician: Referring Physician: Constance Goltz Treating Physician/Extender: Frann Rider in Treatment: 60 Education Assessment Education Provided To: Patient Education Topics Provided Venous: Handouts: Other: leave wrap in place for the week and do not get wet Methods: Explain/Verbal Responses: State content correctly Electronic Signature(s) Signed: 06/08/2015 11:03:48 AM By: Judene Companion MD Entered By: Judene Companion on 06/08/2015 11:03:48 [name] ([id number]) -------------------------------------------------------------------------------- Wound Assessment Details Patient Name: [name] Date of Service: 06/08/2015 9:30 AM Medical Record Number: [id number] Patient Account Number: [id number] Date of Birth/Sex: [birth year] (81 y.o. Female) Treating RN: Afful, RN, BSN, [employer] Primary Care Physician: SYSTEM, PCP Other Clinician: Referring Physician: Constance Goltz Treating Physician/Extender: Frann Rider in Treatment: 38 Wound Status Wound Number: 6 Primary Arterial Insufficiency Ulcer Etiology: Wound Location: Left Achilles Wound Open Wounding Event: Gradually Appeared Status: Date Acquired: 09/26/2014 Comorbid Glaucoma, Asthma, Hypertension, Weeks Of Treatment: 35 History: Peripheral Venous Disease, Type II Clustered Wound: No Diabetes Photos Photo Uploaded By: Regan Lemming on 06/08/2015 11:46:51 Wound Measurements Length: (cm) 0.9 Width: (cm) 1 Depth: (cm) 0.1 Area: (cm) 0.707 Volume: (cm) 0.071 % Reduction in Area: 81.2% % Reduction in Volume: 96.2% Epithelialization: Medium (34-66%) Tunneling: No Undermining: No Wound Description Classification: Partial Thickness Foul O Diabetic Severity (Wagner): Grade 1 Wound Margin: Distinct, outline attached Exudate Amount: Small Exudate Type: Serous Exudate Color: amber dor After Cleansing: No Wound Bed Granulation Amount: Medium (34-66%) Exposed Structure Necrotic Amount: Small (1-33%) Fascia Exposed: No Necrotic Quality:  Adherent Slough Fat Layer Exposed: No [name], [name] ([id number]) Tendon Exposed: No Muscle Exposed: No Joint Exposed: No Bone Exposed: No Limited to Skin Breakdown Periwound Skin Texture Texture Color No Abnormalities Noted: No No Abnormalities Noted: No Callus: No Atrophie Blanche: No Crepitus: No Cyanosis: No Excoriation: No Ecchymosis: No Fluctuance: No Erythema: No Friable: No Hemosiderin Staining: Yes Induration: No Mottled: No Localized Edema: Yes Pallor: No Rash: No Rubor: No Scarring: No Temperature / Pain Moisture Temperature: No Abnormality No Abnormalities Noted: No Dry / Scaly: No Maceration: No Moist: Yes Wound Preparation Ulcer Cleansing: Other: Soap and water, Topical Anesthetic Applied: Other: lidocaine 4%, Electronic Signature(s) Signed: 06/08/2015 9:37:40 AM  By: Regan Lemming BSN, RN Previous Signature: 06/08/2015 9:36:18 AM Version By: Regan Lemming BSN, RN Entered By: Regan Lemming on 06/08/2015 09:37:40 [name] ([id number]) -------------------------------------------------------------------------------- Vitals Details Patient Name: [name] Date of Service: 06/08/2015 9:30 AM Medical Record Number: [id number] Patient Account Number: [id number] Date of Birth/Sex: [birth year] (81 y.o. Female) Treating RN: Afful, RN, BSN, [place] Primary Care Physician: SYSTEM, PCP Other Clinician: Referring Physician: Constance Goltz Treating Physician/Extender: Frann Rider in Treatment: 23 Vital Signs Time Taken: 09:30 Temperature (F): 98.1 Height (in): 64 Pulse (bpm): 73 Weight (lbs): 180 Respiratory Rate (breaths/min): 17 Body Mass Index (BMI): 30.9 Blood Pressure (mmHg): 147/59 Reference Range: 80 - 120 mg / dl Electronic Signature(s) Signed: 06/08/2015 9:31:02 AM By: Regan Lemming BSN, RN Entered By: Regan Lemming on 06/08/2015 09:31:02

## 2015-06-11 NOTE — Progress Notes (Signed)
[name], HOLTZER ([id number]) Visit Report for 06/08/2015 Chief Complaint Document Details Patient Name: [name], [name] Date of Service: 06/08/2015 9:30 AM Medical Record Number: [id number] Patient Account Number: [id number] Date of Birth/Sex: Oct 16, 1927 (81 y.o. Female) Treating RN: Afful, RN, BSN, Velva Harman Primary Care Physician: SYSTEM, PCP Other Clinician: Referring Physician: Constance Goltz Treating Physician/Extender: Benjaman Pott in Treatment: 23 Information Obtained from: Patient Chief Complaint Patient presents to the wound care center today with an open arterial ulcer on the left foot big toe and lateral heal. She also has some superficial ulcerations on the second and third toe dorsum. she has no family member with her today and she is a very poor historian and cannot give a proper history. As stated that she walks around with a walker. Electronic Signature(s) Signed: 06/08/2015 10:56:12 AM By: Judene Companion MD Entered By: Judene Companion on 06/08/2015 10:56:12 [name] ([id number]) -------------------------------------------------------------------------------- Cellular or Tissue Based Product Details Patient Name: [name] Date of Service: 06/08/2015 9:30 AM Medical Record Number: [id number] Patient Account Number: [id number] Date of Birth/Sex: May 21, 1928 (81 y.o. Female) Treating RN: Montey Hora Primary Care Physician: SYSTEM, PCP Other Clinician: Referring Physician: Constance Goltz Treating Physician/Extender: Frann Rider in Treatment: 38 Cellular or Tissue Based Wound #6 Left Achilles Product Type Applied to: Performed By: Physician Christin Fudge, MD Cellular or Tissue Based Other Product Type: Time-Out Taken: Yes Location: trunk / arms / legs Wound Size (sq cm): 0.9 Product Size (sq cm): 4 Waste Size (sq cm): 3 Waste Reason: wound size Amount of Product Applied (sq cm): 1 Lot #: [id number] Expiration Date: 03/18/2016 Fenestrated:  No Reconstituted: No Secured: Yes Secured With: Steri-Strips Dressing Applied: Yes Primary Dressing: mepitel Procedural Pain: 0 Post Procedural Pain: 0 Response to Treatment: Procedure was tolerated well Post Procedure Diagnosis Same as Pre-procedure Electronic Signature(s) Signed: 06/08/2015 3:16:02 PM By: Montey Hora Entered By: Montey Hora on 06/08/2015 09:52:07 [name] ([id number]) -------------------------------------------------------------------------------- HPI Details Patient Name: [name] Date of Service: 06/08/2015 9:30 AM Medical Record Number: [id number] Patient Account Number: [id number] Date of Birth/Sex: 1927/11/07 (81 y.o. Female) Treating RN: Afful, RN, BSN, Velva Harman Primary Care Physician: SYSTEM, PCP Other Clinician: Referring Physician: Constance Goltz Treating Physician/Extender: Benjaman Pott in Treatment: 38 History of Present Illness Location: left heel Quality: Patient reports experiencing a dull pain to affected area(s). Severity: Patient states wound are getting worse. Duration: Patient states that they are not certain how long the wound has been present. Timing: Pain in wound is Intermittent (comes and goes Context: The wound appeared gradually over time Associated Signs and Symptoms: Patient reports having difficulty standing for long periods. HPI Description: This 81 year old patient is a poor historian and comes from a nursing home. Does not have any family members with her. Says she has a ulcer on the lateral part of her left foot and some new ones are there on her right foot too. She is unable to say how long she's had these. she does walk with a walker and this is limited most of the day. Does not recall if she has had any vascular studies done. I have reviewed an x-ray done of the left foot which basically shows osteoporosis but no evidence of fracture dislocation or osteomyelitis. 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 She seems to be doing better overall. 10/19/14 -- I  understand she has gone to the vascular surgery and lab yesterday and has had all her tests done yesterday. She is doing fine otherwise and has had no fresh issues. 10/26/2014 she seems to be doing well and has no fresh problems and seems much brighter. 10/12/14 -- Taking her antibiotic and continues to do her dressings locally and she is helped by her skilled nursing. She seems to be doing fine and has no fresh issues. 10/19/14 -- she is doing fine and has had no fresh issues. She says her son to go for her vascular workup yesterday and she has been called back after 3 months. we will try and gather these reports to review what they said. 10/26/14 --I have been able to review notes from the vascular surgery office and these were dated from 10/18/2014. He patient was on her first postop visit and had Doppler ultrasounds done of her arteries. She had more than 50% stenosis of the right superficial femoral artery and more than 50% stenosis in the left tibioperoneal trunk. She was status post a left lower extremity angiogram with angioplasty of the peroneal and left superficial femoral arteries for a nonhealing left foot and ankle ulceration. note is made of the fact that the ABIs had not changed in the last month since her previous visit. The opinion of her provider was that she did not need any intervention at this time and they had done everything they could at the present time. They would continue on Plavix and antibiotics and see her back in 3 months for a another arterial duplex study of the lower extremities. [name], [name] ([id number]) 11/02/2014 -- she has spoken to her caregivers about coming for HBO 5 times per week for 6-8 weeks and they are agreeable about this and she  is willing to undergo hyperbaric oxygen therapy. 11/16/2014 she has been worked up with a EKG and chest x-ray and these were within normal limits. As far as the investigations go she is ready for HBOT. we are awaiting some insurance clearance so that she can start on her hyperbaric oxygen therapy. 11/30/2014 she tolerated hyperbaric oxygen therapy fine and there were no issues with her blood glucose levels. As noted she is not a diabetic and most of her fingerstick blood glucose levels are inaccurate because of the Raynaud's phenomena. She normally has to have your lobe sticks to get any random glucose levels. I have also requested her primary care does a venous blood glucose draw to check her glucose level. 12/21/2014 -- she is tolerating hyperbaric oxygen very well and has overall made a lot of progress. Her left leg is looking much better. 01/11/2015 - No new complaints today. New ulceration on the dorsum of the right second toe noted today on physical exam. Patient unaware. No significant pain. No fever or chills. No significant drainage. Offloading with sage boots at night. Significantly improved with HBO. 01/19/2015 - the patient has had a x-ray of the right foot done at the nursing home but we still do not have the report and we are awaiting this result. She is otherwise doing fine with her general health. 01/25/2015 -- X-ray of her right foot showed no acute fracture, dislocation, bony infection or osteomyelitis. The was some soft tissue swelling and osteopenia seen. 02/15/2015 -- due to some insurance issues we could not get her epifix and we are working with her company to get her this. She is doing fine otherwise and has no fresh issues. 04/04/2015 -- she had  a follow-up with the vascular surgeon and he seemed to be pleased with her progress and has had no problems with her vascularity. 05/04/2015 -- the wound on the left posterior ankle had stalled and after talking to the  [employer] we have obtained her samples of PuraPly and we will use this on a weekly basis. 05/10/2015 - .here for the second application of puraply. 05/17/2015 -- she is here for her third application of puraply [id number] ---here for the fifth application of puraply Electronic Signature(s) Signed: 06/08/2015 10:58:25 AM By: Judene Companion MD Entered By: Judene Companion on 06/08/2015 10:58:25 JAMARI, WALDRON ([id number]) -------------------------------------------------------------------------------- Physical Exam Details Patient Name: [name] Date of Service: 06/08/2015 9:30 AM Medical Record Number: [id number] Patient Account Number: [id number] Date of Birth/Sex: [birth year] (81 y.o. Female) Treating RN: Afful, RN, BSN, [employer] Primary Care Physician: SYSTEM, PCP Other Clinician: Referring Physician: Constance Goltz Treating Physician/Extender: Benjaman Pott in Treatment: 66 Electronic Signature(s) Signed: 06/08/2015 10:58:36 AM By: Judene Companion MD Entered By: Judene Companion on 06/08/2015 10:58:36 [name] ([id number]) -------------------------------------------------------------------------------- Physician Orders Details Patient Name: [name] Date of Service: 06/08/2015 9:30 AM Medical Record Number: [id number] Patient Account Number: [id number] Date of Birth/Sex: 12-Dec-1927 (81 y.o. Female) Treating RN: Montey Hora Primary Care Physician: SYSTEM, PCP Other Clinician: Referring Physician: Constance Goltz Treating Physician/Extender: Frann Rider in Treatment: 77 Verbal / Phone Orders: Yes Clinician: Montey Hora Read Back and Verified: Yes Diagnosis Coding Wound Cleansing Wound #6 Left Achilles o Clean wound with Normal Saline. Anesthetic Wound #6 Left Achilles o Topical Lidocaine 4% cream applied to wound bed prior to debridement Skin Barriers/Peri-Wound Care Wound #6 Left Achilles o Skin Prep Primary Wound Dressing Wound  #6 Left Achilles o Other: - PUraPly antimicrobial wound matrix Secondary Dressing Wound #6 Left Achilles o Gauze and Kerlix/Conform - coban from toes to 3cm below the knee Dressing Change Frequency Wound #6 Left Achilles o Change dressing every week Follow-up Appointments Wound #6 Left Achilles o Return Appointment in 1 week. Edema Control Wound #6 Left Achilles o 2 Layer Lite Compression System - Left Lower Extremity - lightly wrapped. Kerlix and [employer]) [place], [place] ([id number]) Signed: 06/08/2015 3:16:02 PM By: Montey Hora Signed: 06/11/2015 8:13:13 AM By: Christin Fudge MD, FACS Entered By: Montey Hora on 06/08/2015 09:48:26 TRANIYAH, CHRISTINE ([id number]) -------------------------------------------------------------------------------- Problem List Details Patient Name: [name] Date of Service: 06/08/2015 9:30 AM Medical Record Number: [id number] Patient Account Number: [id number] Date of Birth/Sex: [birth year] (81 y.o. 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 09/12/2014 Yes of heel and midfoot I70.235 Atherosclerosis of native arteries of right leg with 09/12/2014 Yes ulceration of other part of foot Inactive Problems Resolved Problems ICD-10 Code Description Active Date Resolved Date L97.412 Non-pressure chronic ulcer of right heel and midfoot with 10/26/2014 10/26/2014 fat layer exposed S91.104A Unspecified open wound of right lesser toe(s) without 01/11/2015 01/11/2015 damage to nail, initial encounter Electronic Signature(s) Signed:  06/08/2015 10:56:01 AM By: Judene Companion MD Entered By: Judene Companion on 06/08/2015 10:56:00 [name] ([id number]) -------------------------------------------------------------------------------- Progress Note Details Patient Name: [name] Date of Service: 06/08/2015 9:30 AM Medical Record Number: [id number] Patient Account Number: [id number] Date of Birth/Sex: April 15, 1928 (81 y.o. Female)  Treating RN: Baruch Gouty, RN, BSN, [employer] Primary Care Physician: SYSTEM, PCP Other Clinician: Referring Physician: Constance Goltz Treating Physician/Extender: Benjaman Pott in Treatment: 84 Subjective Chief Complaint Information obtained from Patient Patient presents to the wound care center today with an open arterial ulcer on the left foot big toe and lateral heal. She also has some superficial ulcerations on the second and third toe dorsum. she has no family member with her today and she is a very poor historian and cannot give a proper history. As stated that she walks around with a walker. History of Present Illness (HPI) The following HPI elements were documented for the patient's wound: Location: left heel Quality: Patient reports experiencing a dull pain to affected area(s). Severity: Patient states wound are getting worse. Duration: Patient states that they are not certain how long the wound has been present. Timing: Pain in wound is Intermittent (comes and goes Context: The wound appeared gradually over time Associated Signs and Symptoms: Patient reports having difficulty standing for long periods. This 81 year old patient is a poor historian and comes from a nursing home. Does not have any family members with her. Says she has a ulcer on the lateral part of her left foot and some new ones are there on her right foot too. She is unable to say how long she's had these. she does walk with a walker and this is limited most of the day. Does not recall if she has had any vascular  studies done. I have reviewed an x-ray done of the left foot which basically shows osteoporosis but no evidence of fracture dislocation or osteomyelitis. 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 She seems to be doing better overall. 10/19/14 -- I understand she has gone to the vascular surgery and lab yesterday and has had all her tests done yesterday. She is doing fine otherwise and has had no fresh issues. 10/26/2014 she seems to be doing well and has no fresh problems and seems much brighter. 10/12/14 -- Taking her antibiotic and continues to do her dressings locally and she is helped by her skilled nursing. She seems to be doing fine and has no fresh issues. 10/19/14 -- she is doing fine and has had no fresh issues. She says her son to go for her vascular workup yesterday and she has been called back after 3 months. we will try and gather these reports to review what JAZZMINE, [place] ([id number]) they said. 10/26/14 --I have been able to review notes from the vascular surgery office and these were dated from 10/18/2014. He patient was on her first postop visit and had Doppler ultrasounds done of her arteries. She had more than 50% stenosis of the right superficial femoral artery and more than 50% stenosis in the left tibioperoneal trunk. She was status post a left lower extremity angiogram with angioplasty of the peroneal and left superficial femoral arteries for a nonhealing left foot and ankle ulceration. note is made of the fact that the ABIs had not changed in the last month since her previous visit. The opinion of her provider was that she did not need any intervention at this time and they had done everything they could at the present time. They would continue on Plavix and antibiotics and see her back in 3 months for a  another arterial duplex  study of the lower extremities. 11/02/2014 -- she has spoken to her caregivers about coming for HBO 5 times per week for 6-8 weeks and they are agreeable about this and she is willing to undergo hyperbaric oxygen therapy. 11/16/2014 she has been worked up with a EKG and chest x-ray and these were within normal limits. As far as the investigations go she is ready for HBOT. we are awaiting some insurance clearance so that she can start on her hyperbaric oxygen therapy. 11/30/2014 she tolerated hyperbaric oxygen therapy fine and there were no issues with her blood glucose levels. As noted she is not a diabetic and most of her fingerstick blood glucose levels are inaccurate because of the Raynaud's phenomena. She normally has to have your lobe sticks to get any random glucose levels. I have also requested her primary care does a venous blood glucose draw to check her glucose level. 12/21/2014 -- she is tolerating hyperbaric oxygen very well and has overall made a lot of progress. Her left leg is looking much better. 01/11/2015 - No new complaints today. New ulceration on the dorsum of the right second toe noted today on physical exam. Patient unaware. No significant pain. No fever or chills. No significant drainage. Offloading with sage boots at night. Significantly improved with HBO. 01/19/2015 - the patient has had a x-ray of the right foot done at the nursing home but we still do not have the report and we are awaiting this result. She is otherwise doing fine with her general health. 01/25/2015 -- X-ray of her right foot showed no acute fracture, dislocation, bony infection or osteomyelitis. The was some soft tissue swelling and osteopenia seen. 02/15/2015 -- due to some insurance issues we could not get her epifix and we are working with her company to get her this. She is doing fine otherwise and has no fresh issues. 04/04/2015 -- she had a follow-up with the vascular  surgeon and he seemed to be pleased with her progress and has had no problems with her vascularity. 05/04/2015 -- the wound on the left posterior ankle had stalled and after talking to the [employer] we have obtained her samples of PuraPly and we will use this on a weekly basis. 05/10/2015 - .here for the second application of puraply. 05/17/2015 -- she is here for her third application of puraply [id number] ---here for the fifth application of puraply [place], [place] ([id number]) Objective Constitutional Vitals Time Taken: 9:30 AM, Height: 64 in, Weight: 180 lbs, BMI: 30.9, Temperature: 98.1 F, Pulse: 73 bpm, Respiratory Rate: 17 breaths/min, Blood Pressure: 147/59 mmHg. Integumentary (Hair, Skin) Wound #6 status is Open. Original cause of wound was Gradually Appeared. The wound is located on the Left Achilles. The wound measures 0.9cm length x 1cm width x 0.1cm depth; 0.707cm^2 area and 0.071cm^3 volume. The wound is limited to skin breakdown. There is no tunneling or undermining noted. There is a small amount of serous drainage noted. The wound margin is distinct with the outline attached to the wound base. There is medium (34-66%) granulation within the wound bed. There is a small (1-33%) amount of necrotic tissue within the wound bed including Adherent Slough. The periwound skin appearance exhibited: Localized Edema, Moist, Hemosiderin Staining. The periwound skin appearance did not exhibit: Callus, Crepitus, Excoriation, Fluctuance, Friable, Induration, Rash, Scarring, Dry/Scaly, Maceration, Atrophie Blanche, Cyanosis, Ecchymosis, Mottled, Pallor, Rubor, Erythema. Periwound temperature was noted as No Abnormality. Assessment Active Problems ICD-10 L97.422 - Non-pressure chronic ulcer of left heel and midfoot with  fat layer exposed I70.244 - Atherosclerosis of native arteries of left leg with ulceration of heel and midfoot I70.235 - Atherosclerosis of native arteries of  right leg with ulceration of other part of foot Procedures Wound #6 Wound #6 is an Arterial Insufficiency Ulcer located on the Left Achilles. A skin graft procedure using a bioengineered skin substitute/cellular or tissue based product was performed by Christin Fudge, MD. Other was applied and secured with Steri-Strips. 1 sq cm of product was utilized and 3 sq cm was wasted due to wound size. Post Application, mepitel was applied. A Time Out was conducted prior to the start of the procedure. The procedure was tolerated well with a pain level of 0 throughout and a pain level of 0 following the procedure. Post procedure Diagnosis Wound #6: Same as Pre-Procedure HEATHERMARIE, SCHISLER ([id number]) . Plan Wound Cleansing: Wound #6 Left Achilles: Clean wound with Normal Saline. Anesthetic: Wound #6 Left Achilles: Topical Lidocaine 4% cream applied to wound bed prior to debridement Skin Barriers/Peri-Wound Care: Wound #6 Left Achilles: Skin Prep Primary Wound Dressing: Wound #6 Left Achilles: Other: - PUraPly antimicrobial wound matrix Secondary Dressing: Wound #6 Left Achilles: Gauze and Kerlix/Conform - coban from toes to 3cm below the knee Dressing Change Frequency: Wound #6 Left Achilles: Change dressing every week Follow-up Appointments: Wound #6 Left Achilles: Return Appointment in 1 week. Edema Control: Wound #6 Left Achilles: 2 Layer Lite Compression System - Left Lower Extremity - lightly wrapped. Kerlix and coban Follow-Up Appointments: A Patient Clinical Summary of Care was provided to DJ Wound smaller. Today applied anotherPuraply graft to a 1cm ulcer over left Achilles area. Electronic Signature(s) Signed: 06/08/2015 11:01:24 AM By: Judene Companion MD [place], [place] ([id number]) Entered By: Judene Companion on 06/08/2015 11:01:24 MYSTIE, DELHIERRO ([id number]) -------------------------------------------------------------------------------- SuperBill Details Patient Name: [name] Date of Service: 06/08/2015 Medical Record Number: [id number] Patient Account Number: [id number] Date of Birth/Sex: 05-Apr-1928 (81 y.o. 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 of heel and midfoot I70.235 Atherosclerosis of native arteries of right leg with ulceration of other part of foot Facility Procedures CPT4: Description Modifier Quantity Code [id number] - SKIN SUB GRAFT TRNK/ARM/LEG 1 ICD-10 Description Diagnosis 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

## 2015-06-15 ENCOUNTER — Encounter: Payer: Medicare Other | Admitting: Surgery

## 2015-06-15 DIAGNOSIS — L97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: Secondary | ICD-10-CM | POA: Diagnosis not present

## 2015-06-15 NOTE — Progress Notes (Addendum)
[name] ([id number]) Visit Report for 06/15/2015 Chief Complaint Document Details Patient Name: [name] Date of Service: 06/15/2015 9:30 AM Medical Record Number: [id number] Patient Account Number: [id number] Date of Birth/Sex: 03-16-1928 (81 y.o. Female) Treating RN: Montey Hora Primary Care Physician: SYSTEM, PCP Other Clinician: Referring Physician: Constance Goltz Treating Physician/Extender: Frann Rider in Treatment: 74 Information Obtained from: Patient Chief Complaint Patient presents to the wound care center today with an open arterial ulcer on the left foot big toe and lateral heal. She also has some superficial ulcerations on the second and third toe dorsum. she has no family member with her today and she is a very poor historian and cannot give a proper history. As stated that she walks around with a walker. Electronic Signature(s) Signed: 06/15/2015 9:47:08 AM By: Christin Fudge MD, FACS Entered By: Christin Fudge on 06/15/2015 09:47:07 [name] ([id number]) -------------------------------------------------------------------------------- Cellular or Tissue Based Product Details Patient Name: [name] Date of Service: 06/15/2015 9:30 AM Medical Record Number: [id number] Patient Account Number: [id number] Date of Birth/Sex: 1928/02/25 (81 y.o. Female) Treating RN: Montey Hora Primary Care Physician: SYSTEM, PCP Other Clinician: Referring Physician: Constance Goltz Treating Physician/Extender: Frann Rider in Treatment: 39 Cellular or Tissue Based Wound #6 Left Achilles Product Type Applied to: Performed By: Physician Christin Fudge, MD Cellular or Tissue Based Other Product Type: Time-Out Taken: Yes Location: trunk / arms / legs Wound Size (sq cm): 0.81 Product Size (sq cm): 4 Waste Size (sq cm): 3 Waste Reason: wound size Amount of Product Applied (sq cm): 1 Lot #: [id number] Expiration Date: 03/18/2016 Fenestrated:  Yes Reconstituted: Yes Solution Type: saline Solution Amount: 16ml Lot #: [id number] Solution Expiration 02/15/2017 Date: Secured: Yes Secured With: Steri-Strips Dressing Applied: Yes Primary Dressing: mepitel Procedural Pain: 0 Post Procedural Pain: 0 Response to Treatment: Procedure was tolerated well Post Procedure Diagnosis Same as Pre-procedure Electronic Signature(s) Signed: 06/15/2015 10:08:33 AM By: Christin Fudge MD, FACS Entered By: Christin Fudge on 06/15/2015 10:08:33 [name] ([id number]) -------------------------------------------------------------------------------- HPI Details Patient Name: [name] Date of Service: 06/15/2015 9:30 AM Medical Record Number: [id number] Patient Account Number: [id number] Date of Birth/Sex: 08-Jan-1928 (81 y.o. Female) Treating RN: Montey Hora Primary Care Physician: SYSTEM, PCP Other Clinician: Referring Physician: Constance Goltz Treating Physician/Extender: Frann Rider in Treatment: 26 History of Present Illness Location: left heel Quality: Patient reports experiencing a dull pain to affected area(s). Severity: Patient states wound are getting worse. Duration: Patient states that they are not certain how long the wound has been present. Timing: Pain in wound is Intermittent (comes and goes Context: The wound appeared gradually over time Associated Signs and Symptoms: Patient reports having difficulty standing for long periods. HPI Description: This 81 year old patient is a poor historian and comes from a nursing home. Does not have any family members with her. Says she has a ulcer on the lateral part of her left foot and some new ones are there on her right foot too. She is unable to say how long she's had these. she does walk with a walker and this is limited most of the day. Does not recall if she has had any vascular studies done. I have reviewed an x-ray done of the left foot which basically shows osteoporosis  but no evidence of fracture dislocation or osteomyelitis. 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 She seems to be doing better overall. 10/19/14 -- I understand she has gone to the vascular surgery and lab yesterday and has had all her tests done yesterday. She is doing fine otherwise and has had no fresh issues. 10/26/2014 she seems to be doing well and has no fresh problems and seems much brighter. 10/12/14 -- Taking her antibiotic and continues to do her dressings locally and she is helped by her skilled nursing. She seems to be doing fine and has no fresh issues. 10/19/14 -- she is doing fine and has had no fresh issues. She says her son to go for her vascular workup yesterday and she has been called back after 3 months. we will try and gather these reports to review what they said. 10/26/14 --I have been able to review notes from the vascular surgery office and these were dated from 10/18/2014. He patient was on her first postop visit and had Doppler ultrasounds done of her arteries. She had more than 50% stenosis of the right superficial femoral artery and more than 50% stenosis in the left tibioperoneal trunk. She was status post a left lower extremity angiogram with angioplasty of the peroneal and left superficial femoral arteries for a nonhealing left foot and ankle ulceration. note is made of the fact that the ABIs had not changed in the last month since her previous visit. The opinion of her provider was that she did not need any intervention at this time and they had done everything they could at the present time. They would continue on Plavix and antibiotics and see her back in 3 months for a another arterial duplex study of the lower extremities. [name] ([id number]) 11/02/2014 -- she has spoken to her  caregivers about coming for HBO 5 times per week for 6-8 weeks and they are agreeable about this and she is willing to undergo hyperbaric oxygen therapy. 11/16/2014 she has been worked up with a EKG and chest x-ray and these were within normal limits. As far as the investigations go she is ready for HBOT. we are awaiting some insurance clearance so that she can start on her hyperbaric oxygen therapy. 11/30/2014 she tolerated hyperbaric oxygen therapy fine and there were no issues with her blood glucose levels. As noted she is not a diabetic and most of her fingerstick blood glucose levels are inaccurate because of the Raynaud's phenomena. She normally has to have your lobe sticks to get any random glucose levels. I have also requested her primary care does a venous blood glucose draw to check her glucose level. 12/21/2014 -- she is tolerating hyperbaric oxygen very well and has overall made a lot of progress. Her left leg is looking much better. 01/11/2015 - No new complaints today. New ulceration on the dorsum of the right second toe noted today on physical exam. Patient unaware. No significant pain. No fever or chills. No significant drainage. Offloading with sage boots at night. Significantly improved with HBO. 01/19/2015 - the patient has had a x-ray of the right foot done at the nursing home but we still do not have the report and we are awaiting this result. She is otherwise doing fine with her general health. 01/25/2015 -- X-ray of her right foot showed no acute fracture, dislocation, bony infection or osteomyelitis. The was some soft tissue swelling and osteopenia seen. 02/15/2015 -- due to some insurance issues we could not get her epifix and we are working with her company to get her this. She is  doing fine otherwise and has no fresh issues. 04/04/2015 -- she had a follow-up with the vascular surgeon and he seemed to be pleased with her progress and has had no problems with her  vascularity. 05/04/2015 -- the wound on the left posterior ankle had stalled and after talking to the [employer] we have obtained her samples of PuraPly and we will use this on a weekly basis. 05/10/2015 - .here for the second application of puraply. 05/17/2015 -- she is here for her third application of puraply [id number] ---here for the fifth application of puraply [id number] -- last week she had her sixth application of Puraply and this week will be the seventh application of Puraply. Electronic Signature(s) Signed: 06/15/2015 9:48:27 AM By: Christin Fudge MD, FACS Entered By: Christin Fudge on 06/15/2015 09:48:26 [name], [name] ([id number]) -------------------------------------------------------------------------------- Physical Exam Details Patient Name: [name] Date of Service: 06/15/2015 9:30 AM Medical Record Number: [id number] Patient Account Number: [id number] Date of Birth/Sex: 08-21-1927 (81 y.o. Female) Treating RN: Montey Hora Primary Care Physician: SYSTEM, PCP Other Clinician: Referring Physician: Constance Goltz Treating Physician/Extender: Frann Rider in Treatment: 39 Constitutional . Pulse regular. Respirations normal and unlabored. Afebrile. . Eyes Nonicteric. Reactive to light. Ears, Nose, Mouth, and Throat Lips, teeth, and gums WNL.Marland Kitchen Moist mucosa without lesions . Neck supple and nontender. No palpable supraclavicular or cervical adenopathy. Normal sized without goiter. Respiratory WNL. No retractions.. Cardiovascular Pedal Pulses WNL. No clubbing, cyanosis or edema. Lymphatic No adneopathy. No adenopathy. No adenopathy. Musculoskeletal Adexa without tenderness or enlargement.. Digits and nails w/o clubbing, cyanosis, infection, petechiae, ischemia, or inflammatory conditions.. Integumentary (Hair, Skin) No suspicious lesions. No crepitus or fluctuance. No peri-wound warmth or erythema. No masses.Marland Kitchen Psychiatric Judgement and  insight Intact.. No evidence of depression, anxiety, or agitation.. Notes The wound has minimal debris which will be sharply debrided with a curette and the base of the ulcer is quite clean after that. Electronic Signature(s) Signed: 06/15/2015 10:06:50 AM By: Christin Fudge MD, FACS Entered By: Christin Fudge on 06/15/2015 10:06:49 [name] ([id number]) -------------------------------------------------------------------------------- Physician Orders Details Patient Name: [name] Date of Service: 06/15/2015 9:30 AM Medical Record Number: [id number] Patient Account Number: [id number] Date of Birth/Sex: 12/15/1927 (81 y.o. 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 Anesthetic Wound #6 Left Achilles o Topical Lidocaine 4% cream applied to wound bed prior to debridement Skin Barriers/Peri-Wound Care Wound #6 Left Achilles o Skin Prep Primary Wound Dressing Wound #6 Left Achilles o Other: - PuraPly antimicrobial wound matrix Secondary Dressing Wound #6 Left Achilles o Gauze and Kerlix/Conform - coban lightly to secure - only around ankle Dressing Change Frequency Wound #6 Left Achilles o Change dressing every week Follow-up Appointments Wound #6 Left Achilles o Return Appointment in 1 week. [name], [name] ([id number]) Electronic  Signature(s) Signed: 06/15/2015 4:17:13 PM By: Christin Fudge MD, FACS Signed: 06/15/2015 5:07:53 PM By: Montey Hora Entered By: Montey Hora on 06/15/2015 10:17:19 [name] ([id number]) -------------------------------------------------------------------------------- Problem List Details Patient Name: [name] Date  of Service: 06/15/2015 9:30 AM Medical Record Number: [id number] Patient Account Number: [id number] Date of Birth/Sex: 04/06/1928 (81 y.o. Female) Treating RN: Montey Hora Primary Care Physician: SYSTEM, PCP Other Clinician: Referring Physician: Constance Goltz Treating Physician/Extender: Frann Rider in Treatment: 58 Active Problems ICD-10 Encounter Code Description Active Date Diagnosis L97.422 Non-pressure chronic ulcer of left heel and midfoot with fat 09/12/2014 Yes layer exposed 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 09/12/2014 Yes of heel and midfoot I70.235 Atherosclerosis of native arteries of right leg with 09/12/2014 Yes ulceration of other part of foot Inactive Problems Resolved Problems ICD-10 Code Description Active Date Resolved Date L97.412 Non-pressure chronic ulcer of right heel and midfoot with 10/26/2014 10/26/2014 fat layer exposed S91.104A Unspecified open wound of right lesser toe(s) without 01/11/2015 01/11/2015 damage to nail, initial encounter Electronic Signature(s) Signed: 06/15/2015 9:46:59 AM By: Christin Fudge MD, FACS Entered By: Christin Fudge on 06/15/2015 09:46:59 [name] ([id number]) -------------------------------------------------------------------------------- Progress Note Details Patient Name: [name] Date of Service: 06/15/2015 9:30 AM Medical Record Number: [id number] Patient Account Number: [id number] Date of Birth/Sex: 29-Apr-1928 (81 y.o. Female) Treating RN: Montey Hora Primary Care Physician: SYSTEM, PCP Other Clinician: Referring Physician: Constance Goltz Treating Physician/Extender: Frann Rider in Treatment: 86 Subjective Chief Complaint Information obtained from Patient Patient presents to the wound care center today with an open arterial ulcer on the left foot big toe and lateral heal. She also has some superficial ulcerations on the second and third toe dorsum. she has no family member with her today and she is a very poor historian and cannot give a proper history. As stated that she walks around with a walker. History of Present Illness (HPI) The following HPI elements were documented for the patient's wound: Location: left heel Quality: Patient reports experiencing a dull pain to affected area(s). Severity: Patient states wound are getting worse. Duration: Patient states that they are not certain how long the wound has been present. Timing: Pain in wound is Intermittent (comes and goes Context: The wound appeared gradually over time Associated Signs and Symptoms: Patient reports having difficulty standing for long periods. This 81 year old patient is a poor historian and comes from a nursing home. Does not have any family members with her. Says she has a ulcer on the lateral part of her left foot and some new ones are there on her right foot too. She is unable to say how long she's had these. she does walk with a walker and this is limited most of the day. Does not recall if she has had any vascular studies done. I have reviewed an x-ray done of the left foot which basically shows osteoporosis but no evidence of fracture dislocation or osteomyelitis. 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 She seems to be doing better overall. 10/19/14 -- I understand she has gone to the vascular surgery and lab yesterday and has had all her tests done yesterday.  She is doing fine otherwise and has had no fresh issues. 10/26/2014 she seems to be doing well and has no fresh problems and seems much brighter. 10/12/14 -- Taking her antibiotic and continues to do her dressings locally and she is helped by her skilled nursing. She seems to be doing fine and has no fresh issues. 10/19/14 -- she is doing fine and  has had no fresh issues. She says her son to go for her vascular workup yesterday and she has been called back after 3 months. we will try and gather these reports to review what REENIE, [name] ([id number]) they said. 10/26/14 --I have been able to review notes from the vascular surgery office and these were dated from 10/18/2014. He patient was on her first postop visit and had Doppler ultrasounds done of her arteries. She had more than 50% stenosis of the right superficial femoral artery and more than 50% stenosis in the left tibioperoneal trunk. She was status post a left lower extremity angiogram with angioplasty of the peroneal and left superficial femoral arteries for a nonhealing left foot and ankle ulceration. note is made of the fact that the ABIs had not changed in the last month since her previous visit. The opinion of her provider was that she did not need any intervention at this time and they had done everything they could at the present time. They would continue on Plavix and antibiotics and see her back in 3 months for a another arterial duplex study of the lower extremities. 11/02/2014 -- she has spoken to her caregivers about coming for HBO 5 times per week for 6-8 weeks and they are agreeable about this and she is willing to undergo hyperbaric oxygen therapy. 11/16/2014 she has been worked up with a EKG and chest x-ray and these were within normal limits. As far as the investigations go she is ready for HBOT. we are awaiting some insurance clearance so that she can start on her hyperbaric oxygen therapy. 11/30/2014 she tolerated  hyperbaric oxygen therapy fine and there were no issues with her blood glucose levels. As noted she is not a diabetic and most of her fingerstick blood glucose levels are inaccurate because of the Raynaud's phenomena. She normally has to have your lobe sticks to get any random glucose levels. I have also requested her primary care does a venous blood glucose draw to check her glucose level. 12/21/2014 -- she is tolerating hyperbaric oxygen very well and has overall made a lot of progress. Her left leg is looking much better. 01/11/2015 - No new complaints today. New ulceration on the dorsum of the right second toe noted today on physical exam. Patient unaware. No significant pain. No fever or chills. No significant drainage. Offloading with sage boots at night. Significantly improved with HBO. 01/19/2015 - the patient has had a x-ray of the right foot done at the nursing home but we still do not have the report and we are awaiting this result. She is otherwise doing fine with her general health. 01/25/2015 -- X-ray of her right foot showed no acute fracture, dislocation, bony infection or osteomyelitis. The was some soft tissue swelling and osteopenia seen. 02/15/2015 -- due to some insurance issues we could not get her epifix and we are working with her company to get her this. She is doing fine otherwise and has no fresh issues. 04/04/2015 -- she had a follow-up with the vascular surgeon and he seemed to be pleased with her progress and has had no problems with her vascularity. 05/04/2015 -- the wound on the left posterior ankle had stalled and after talking to the [employer] we have obtained her samples of PuraPly and we will use this on a weekly basis. 05/10/2015 - .here for the second application of puraply. 05/17/2015 -- she is here for her third application of puraply [id number] ---here for the fifth application of  puraply 06/15/2015 -- last week she had her sixth application  of Puraply and this week will be the seventh application of Puraply. [name], [name] ([id number]) Objective Constitutional Pulse regular. Respirations normal and unlabored. Afebrile. Vitals Time Taken: 9:40 AM, Height: 64 in, Weight: 180 lbs, BMI: 30.9, Temperature: 98.2 F, Pulse: 75 bpm, Respiratory Rate: 18 breaths/min, Blood Pressure: 135/49 mmHg. Eyes Nonicteric. Reactive to light. Ears, Nose, Mouth, and Throat Lips, teeth, and gums WNL.Marland Kitchen Moist mucosa without lesions . Neck supple and nontender. No palpable supraclavicular or cervical adenopathy. Normal sized without goiter. Respiratory WNL. No retractions.. Cardiovascular Pedal Pulses WNL. No clubbing, cyanosis or edema. Lymphatic No adneopathy. No adenopathy. No adenopathy. Musculoskeletal Adexa without tenderness or enlargement.. Digits and nails w/o clubbing, cyanosis, infection, petechiae, ischemia, or inflammatory conditions.Marland Kitchen Psychiatric Judgement and insight Intact.. No evidence of depression, anxiety, or agitation.. General Notes: The wound has minimal debris which will be sharply debrided with a curette and the base of the ulcer is quite clean after that. Integumentary (Hair, Skin) No suspicious lesions. No crepitus or fluctuance. No peri-wound warmth or erythema. No masses.. Wound #6 status is Open. Original cause of wound was Gradually Appeared. The wound is located on the Left Achilles. The wound measures 0.9cm length x 0.9cm width x 0.1cm depth; 0.636cm^2 area and 0.064cm^3 volume. The wound is limited to skin breakdown. There is no tunneling or undermining noted. [name], [name] ([id number]) There is a small amount of serous drainage noted. The wound margin is distinct with the outline attached to the wound base. There is medium (34-66%) red granulation within the wound bed. There is a small (1-33%) amount of necrotic tissue within the wound bed including Adherent Slough. The periwound skin appearance exhibited:  Localized Edema, Moist, Hemosiderin Staining. The periwound skin appearance did not exhibit: Callus, Crepitus, Excoriation, Fluctuance, Friable, Induration, Rash, Scarring, Dry/Scaly, Maceration, Atrophie Blanche, Cyanosis, Ecchymosis, Mottled, Pallor, Rubor, Erythema. Periwound temperature was noted as No Abnormality. 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 She'll come back for review next week. 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 A skin graft procedure using a bioengineered skin substitute/cellular or tissue based product was performed by Christin Fudge, MD. Other was applied and secured with Steri-Strips. 1 sq cm of product was utilized and 3 sq cm was wasted due to wound size. Post Application, mepitel was applied. A Time Out was conducted prior to the start of the procedure. The procedure was tolerated well with a pain level of 0 throughout and a pain level of 0 following the procedure. Post procedure Diagnosis Wound #6: Same as Pre-Procedure . Plan [name], [name] ([id number]) Wound Cleansing: Wound #6 Left Achilles: Clean wound with Normal Saline. Anesthetic: Wound #6 Left Achilles: Topical Lidocaine 4% cream applied to wound bed prior to debridement Skin Barriers/Peri-Wound Care: Wound #6 Left Achilles: Skin Prep Primary Wound Dressing: Wound #6 Left Achilles: Other: - PuraPly antimicrobial wound matrix Secondary Dressing: Wound #6 Left Achilles: Gauze and Kerlix/Conform - coban lightly to  secure - only around ankle Dressing Change Frequency: Wound #6 Left Achilles: Change dressing  every week Follow-up Appointments: Wound #6 Left Achilles: Return Appointment in 1 week. With sterile technique the Puraply was applied bolster in place with moist saline and local compression bandage was lightly applied to keep everything in place. She'll come back for review next week. Electronic Signature(s) Signed: 06/15/2015 4:19:31 PM By: Christin Fudge MD, FACS Previous Signature: 06/15/2015 10:08:55 AM Version By: Christin Fudge MD, FACS Previous Signature: 06/15/2015 10:07:56 AM Version By: Christin Fudge MD, FACS Entered By: Christin Fudge on 06/15/2015 16:19:31 [name] ([id number]) -------------------------------------------------------------------------------- SuperBill Details Patient Name: [name] Date of Service: 06/15/2015 Medical Record Number: [id number] Patient Account Number: [id number] Date of Birth/Sex: 1927-11-15 (81 y.o. 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 I70.235 Atherosclerosis of native arteries of right leg with ulceration of other part of foot Facility Procedures CPT4: Description Modifier Quantity Code [id number] - SKIN SUB GRAFT TRNK/ARM/LEG 1 ICD-10 Description Diagnosis 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 of native arteries of right leg with ulceration of other part of foot Physician Procedures CPT4: Description  Modifier Quantity Code [id number] - WC PHYS SKIN SUB GRAFT TRNK/ARM/LEG 1 ICD-10 Description Diagnosis L97.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 I70.244 Atherosclerosis of native arteries of  left leg with ulceration of heel and midfoot I70.235 Atherosclerosis of native arteries of right leg with ulceration of other part of foot Electronic Signature(s) Signed: 06/15/2015 10:09:08 AM By: Christin Fudge MD, FACS Entered By: Christin Fudge on 06/15/2015 10:09:08

## 2015-06-16 NOTE — Progress Notes (Signed)
EARNESTEEN, AMENT ([id number]) Visit Report for 06/15/2015 Arrival Information Details Patient Name: [name], [name] Date of Service: 06/15/2015 9:30 AM Medical Record Number: [id number] Patient Account Number: [id number] Date of Birth/Sex: Dec 05, 1927 (81 y.o. Female) Treating RN: Montey Hora Primary Care Physician: SYSTEM, PCP Other Clinician: Referring Physician: Constance Goltz Treating Physician/Extender: Frann Rider in Treatment: 12 Visit Information History Since Last Visit Added or deleted any medications: No Patient Arrived: Wheel Chair Any new allergies or adverse reactions: No Arrival Time: 09:35 Had a fall or experienced change in No Accompanied By: self activities of daily living that may affect Transfer Assistance: None risk of falls: Patient Identification Verified: Yes Signs or symptoms of abuse/neglect since last No Secondary Verification Process Yes visito Completed: Hospitalized since last visit: No Patient Requires Transmission- No Pain Present Now: No Based Precautions: Patient Has Alerts: Yes Patient Alerts: Patient on Blood Thinner Electronic Signature(s) Signed: 06/15/2015 5:07:53 PM By: Montey Hora Entered By: Montey Hora on 06/15/2015 09:39:00 [name] ([id number]) -------------------------------------------------------------------------------- Encounter Discharge Information Details Patient Name: [name] Date of Service: 06/15/2015 9:30 AM Medical Record Number: [id number] Patient Account Number: [id number] Date of Birth/Sex: Mar 07, 1928 (81 y.o. Female) Treating RN: Montey Hora Primary Care Physician: SYSTEM, PCP Other Clinician: Referring Physician: Constance Goltz Treating Physician/Extender: Frann Rider in Treatment: 38 Encounter Discharge Information Items Discharge Pain Level: 0 Discharge Condition: Stable Ambulatory Status: Wheelchair Discharge Destination: Home Transportation: Private  Auto Accompanied By: self Schedule Follow-up Appointment: Yes Medication Reconciliation completed and provided to Patient/Care No Mitzi Lilja: Provided on Clinical Summary of Care: 06/15/2015 Form Type Recipient Paper Patient DJ Electronic Signature(s) Signed: 06/15/2015 12:49:07 PM By: Montey Hora Previous Signature: 06/15/2015 10:15:53 AM Version By: Ruthine Dose Entered By: Montey Hora on 06/15/2015 12:49:07 [name] ([id number]) -------------------------------------------------------------------------------- Lower Extremity Assessment Details Patient Name: [name] Date of Service: 06/15/2015 9:30 AM Medical Record Number: [id number] Patient Account Number: [id number] Date of Birth/Sex: 01-31-1928 (81 y.o. Female) Treating RN: Montey Hora Primary Care Physician: SYSTEM, PCP Other Clinician: Referring Physician: Constance Goltz Treating Physician/Extender: Frann Rider in Treatment: 39 Vascular Assessment Pulses: Posterior Tibial Dorsalis Pedis Palpable: [Left:Yes] Extremity colors, hair growth, and conditions: Extremity Color: [Left:Hyperpigmented] Hair Growth on Extremity: [Left:No] Temperature of Extremity: [Left:Warm] Capillary Refill: [Left:< 3 seconds] Toe Nail Assessment Left: Right: Thick: Yes Discolored: No Deformed: No Improper Length and Hygiene: No Electronic Signature(s) Signed: 06/15/2015 5:07:53 PM By: Montey Hora Entered By: Montey Hora on 06/15/2015 09:49:02 [name] ([id number]) -------------------------------------------------------------------------------- Multi Wound Chart Details Patient Name: [name] Date of Service: 06/15/2015 9:30 AM Medical Record Number: [id number] Patient Account Number: [id number] Date of Birth/Sex: 11/14/1927 (81 y.o. 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 N/A] Comorbid History: [6:Glaucoma, Asthma, Hypertension, Peripheral Venous Disease, Type II Diabetes] [N/A:N/A] Date Acquired: [6:09/26/2014] [N/A:N/A] Weeks of Treatment: [6:36] [N/A:N/A] Wound Status: [6:Open] [N/A:N/A] Measurements L x W x D 0.9x0.9x0.1 [N/A:N/A] (cm) Area (cm) : [6:0.636] [N/A:N/A] Volume (cm) : [6:0.064] [N/A:N/A] % Reduction in Area: [6:83.10%] [N/A:N/A] % Reduction in Volume: 96.60% [N/A:N/A] Classification: [6:Partial Thickness] [N/A:N/A] HBO Classification: [6:Grade 1] [N/A:N/A] Exudate Amount: [6:Small] [N/A:N/A] Exudate Type: [6:Serous] [N/A:N/A] Exudate Color: [6:amber] [N/A:N/A] Wound Margin: [6:Distinct, outline attached N/A]  Granulation Amount: [6:Large (67-100%)] [N/A:N/A] Necrotic Amount: [6:None Present (0%)] [N/A:N/A] Exposed Structures: [6:Fascia: No Fat: No Tendon: No Muscle: No Joint: No] [N/A:N/A] Bone: No Limited to Skin Breakdown Epithelialization: Medium (34-66%) N/A N/A Periwound Skin Texture: Edema: Yes N/A N/A Excoriation: No Induration: No Callus: No Crepitus: No Fluctuance: No Friable: No Rash: No Scarring: No Periwound Skin Moist: Yes N/A N/A Moisture: Maceration: No Dry/Scaly: No Periwound Skin Color: Hemosiderin Staining: Yes N/A N/A Atrophie Blanche: No Cyanosis: No Ecchymosis: No Erythema: No Mottled: No Pallor: No Rubor: No Temperature: No Abnormality N/A N/A Tenderness on No N/A N/A Palpation: Wound Preparation: Ulcer Cleansing: N/A N/A Rinsed/Irrigated with Saline Topical Anesthetic Applied: Other:  lidocaine 4% Treatment Notes Electronic Signature(s) Signed: 06/15/2015 5:07:53 PM By: Montey Hora Entered By: Montey Hora on 06/15/2015 09:49:22 KERRYN, CADWALLADER ([id number]) -------------------------------------------------------------------------------- Multi-Disciplinary Care Plan Details Patient Name: [name] Date of Service: 06/15/2015 9:30 AM Medical Record Number: [id number] Patient Account Number: [id number] Date of Birth/Sex: 1928/06/09 (81 y.o. Female) Treating RN: Montey Hora Primary Care Physician: SYSTEM, PCP Other Clinician: Referring Physician: Constance Goltz Treating Physician/Extender: Frann Rider in Treatment: 63 Active Inactive Abuse / Safety / Falls / Self Care Management Nursing Diagnoses: Impaired physical mobility Potential for falls Goals: Patient will remain injury free Date Initiated: 09/12/2014 Goal Status: Active Patient/caregiver will verbalize understanding of skin care regimen Date Initiated: 09/12/2014 Goal Status: Active Patient/caregiver will verbalize/demonstrate measures taken to prevent injury and/or falls Date Initiated: 09/12/2014 Goal Status: Active Patient/caregiver will verbalize/demonstrate understanding of what to do in case of emergency Date Initiated: 09/12/2014 Goal Status: Active Interventions: Assess fall risk on admission and as needed Assess: immobility, friction, shearing, incontinence upon admission and as needed Assess impairment of mobility on admission and as needed per policy Provide education on basic hygiene Provide education on fall prevention Provide education on personal and home safety Provide education on safe transfers Treatment Activities: Education provided on Basic Hygiene : 10/12/2014 Notes: Necrotic Tissue EARNESTEEN, AMENT ([id number]) Nursing Diagnoses: Impaired tissue integrity related to necrotic/devitalized tissue Knowledge deficit related to management of necrotic/devitalized  tissue Goals: Necrotic/devitalized tissue will be minimized in the wound bed Date Initiated: 09/12/2014 Goal Status: Active Patient/caregiver will verbalize understanding of reason and process for debridement of necrotic tissue Date Initiated: 09/12/2014 Goal Status: Active Interventions: Assess patient pain level pre-, during and post procedure and prior to discharge Provide education on necrotic tissue and debridement process Treatment Activities: Apply topical anesthetic as ordered : 06/15/2015 Enzymatic debridement : 06/15/2015 Excisional debridement : 06/15/2015 Notes: Orientation to the Wound Care Program Nursing Diagnoses: Knowledge deficit related to the wound healing center program Goals: Patient/caregiver will verbalize understanding of the [place] [facility] Date Initiated: 09/12/2014 Goal Status: Active Interventions: Provide education on orientation to the wound center Notes: Pressure Nursing Diagnoses: Knowledge deficit related to causes and risk factors for pressure ulcer development Knowledge deficit related to management of pressures ulcers Potential for impaired tissue integrity related to pressure, friction, moisture, and shear GoalsAVIONNA, KARNOWSKI ([id number]) Patient will remain free from development of additional pressure ulcers Date Initiated: 09/12/2014 Goal Status: Active Patient will remain free of pressure ulcers Date Initiated: 09/12/2014 Goal Status: Active Patient/caregiver will verbalize risk factors for pressure ulcer development Date Initiated: 09/12/2014 Goal Status: Active Patient/caregiver will verbalize understanding of pressure ulcer management Date Initiated: 09/12/2014 Goal Status: Active Interventions: Assess: immobility, friction, shearing, incontinence upon admission and as needed Assess offloading mechanisms upon admission and as needed Assess potential for pressure ulcer upon admission and  as needed Provide education on  pressure ulcers Treatment Activities: Pressure reduction/relief device ordered : 06/15/2015 Notes: Wound/Skin Impairment Nursing Diagnoses: Impaired tissue integrity Knowledge deficit related to ulceration/compromised skin integrity Goals: Patient/caregiver will verbalize understanding of skin care regimen Date Initiated: 09/12/2014 Goal Status: Active Ulcer/skin breakdown will heal within 14 weeks Date Initiated: 09/12/2014 Goal Status: Active Interventions: Assess patient/caregiver ability to obtain necessary supplies Assess patient/caregiver ability to perform ulcer/skin care regimen upon admission and as needed Assess ulceration(s) every visit Provide education on ulcer and skin care Treatment Activities: Skin care regimen initiated : 06/15/2015 CANDLE, LATTERELL ([id number]) Topical wound management initiated : 06/15/2015 Notes: Electronic Signature(s) Signed: 06/15/2015 5:07:53 PM By: Montey Hora Entered By: Montey Hora on 06/15/2015 09:49:13 [name] ([id number]) -------------------------------------------------------------------------------- Patient/Caregiver Education Details Patient Name: [name] Date of Service: 06/15/2015 9:30 AM Medical Record Number: [id number] Patient Account Number: [id number] Date of Birth/Gender: 1928/03/21 (81 y.o. Female) Treating RN: Montey Hora Primary Care Physician: SYSTEM, PCP Other Clinician: Referring Physician: Constance Goltz Treating Physician/Extender: Frann Rider in Treatment: 73 Education Assessment Education Provided To: Patient Education Topics Provided Wound/Skin Impairment: Handouts: Other: keep dressing on all week Methods: Explain/Verbal Responses: State content correctly Electronic Signature(s) Signed: 06/15/2015 12:50:00 PM By: Montey Hora Entered By: Montey Hora on 06/15/2015 12:50:00 [name]  ([id number]) -------------------------------------------------------------------------------- Wound Assessment Details Patient Name: [name] Date of Service: 06/15/2015 9:30 AM Medical Record Number: [id number] Patient Account Number: [id number] Date of Birth/Sex: 09-01-1927 (81 y.o. Female) Treating RN: Montey Hora Primary Care Physician: SYSTEM, PCP Other Clinician: Referring Physician: Constance Goltz Treating Physician/Extender: Frann Rider in Treatment: 49 Wound Status Wound Number: 6 Primary Arterial Insufficiency Ulcer Etiology: Wound Location: Left Achilles Wound Open Wounding Event: Gradually Appeared Status: Date Acquired: 09/26/2014 Comorbid Glaucoma, Asthma, Hypertension, Weeks Of Treatment: 36 History: Peripheral Venous Disease, Type II Clustered Wound: No Diabetes Photos Photo Uploaded By: Montey Hora on 06/15/2015 14:43:19 Wound Measurements Length: (cm) 0.9 Width: (cm) 0.9 Depth: (cm) 0.1 Area: (cm) 0.636 Volume: (cm) 0.064 % Reduction in Area: 83.1% % Reduction in Volume: 96.6% Epithelialization: Medium (34-66%) Tunneling: No Undermining: No Wound Description Classification: Partial Thickness Foul O Diabetic Severity (Wagner): Grade 1 Wound Margin: Distinct, outline attached Exudate Amount: Small Exudate Type: Serous Exudate Color: amber dor After Cleansing: No Wound Bed Granulation Amount: Medium (34-66%) Exposed Structure Granulation Quality: Red Fascia Exposed: No Necrotic Amount: Small (1-33%) Fat Layer Exposed: No [place], [place] ([id number]) Necrotic Quality: Adherent Slough Tendon Exposed: No Muscle Exposed: No Joint Exposed: No Bone Exposed: No Limited to Skin Breakdown Periwound Skin Texture Texture Color No Abnormalities Noted: No No Abnormalities Noted: No Callus: No Atrophie Blanche: No Crepitus: No Cyanosis: No Excoriation: No Ecchymosis: No Fluctuance: No Erythema: No Friable: No Hemosiderin  Staining: Yes Induration: No Mottled: No Localized Edema: Yes Pallor: No Rash: No Rubor: No Scarring: No Temperature / Pain Moisture Temperature: No Abnormality No Abnormalities Noted: No Dry / Scaly: No Maceration: No Moist: Yes Wound Preparation Ulcer Cleansing: Rinsed/Irrigated with Saline Topical Anesthetic Applied: Other: lidocaine 4%, Treatment Notes Wound #6 (Left Achilles) 1. Cleansed with: Clean wound with Normal Saline 2. Anesthetic Topical Lidocaine 4% cream to wound bed prior to debridement 3. Peri-wound Care: Skin Prep 4. Dressing Applied: Other dressing (specify in notes) 5. Secondary Dressing Applied Kerlix/Conform Saline moistened guaze Notes Puraply applied, secured lightly with coban Electronic Signature(s) Signed: 06/15/2015 5:07:53 PM By: Nanci Pina, Jocelyne ([id number]) Entered By: Montey Hora on 06/15/2015 09:56:56 [place], Marina ([id number]) -------------------------------------------------------------------------------- Vitals Details Patient Name:  [name] Date of Service: 06/15/2015 9:30 AM Medical Record Number: [id number] Patient Account Number: [id number] Date of Birth/Sex: 08-12-1928 (81 y.o. Female) Treating RN: Montey Hora Primary Care Physician: SYSTEM, PCP Other Clinician: Referring Physician: Constance Goltz Treating Physician/Extender: Frann Rider in Treatment: 20 Vital Signs Time Taken: 09:40 Temperature (F): 98.2 Height (in): 64 Pulse (bpm): 75 Weight (lbs): 180 Respiratory Rate (breaths/min): 18 Body Mass Index (BMI): 30.9 Blood Pressure (mmHg): 135/49 Reference Range: 80 - 120 mg / dl Electronic Signature(s) Signed: 06/15/2015 5:07:53 PM By: Montey Hora Entered By: Montey Hora on 06/15/2015 09:41:37

## 2015-06-22 ENCOUNTER — Encounter: Payer: Medicare Other | Attending: Surgery | Admitting: Surgery

## 2015-06-22 DIAGNOSIS — I70235 Atherosclerosis of native arteries of right leg with ulceration of other part of foot: Secondary | ICD-10-CM | POA: Insufficient documentation

## 2015-06-22 DIAGNOSIS — L97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: Secondary | ICD-10-CM | POA: Diagnosis present

## 2015-06-22 DIAGNOSIS — I70244 Atherosclerosis of native arteries of left leg with ulceration of heel and midfoot: Secondary | ICD-10-CM | POA: Insufficient documentation

## 2015-06-22 NOTE — Progress Notes (Addendum)
[name], [name] ([id number]) Visit Report for 06/22/2015 Chief Complaint Document Details Patient Name: [name], [name] Date of Service: 06/22/2015 8:00 AM Medical Record Number: [id number] Patient Account Number: [id number] Date of Birth/Sex: [birth year] (81 y.o. Female) Treating RN: Cornell Barman Primary Care Physician: SYSTEM, PCP Other Clinician: Referring Physician: Constance Goltz Treating Physician/Extender: Frann Rider in Treatment: 33 Information Obtained from: Patient Chief Complaint Patient presents to the wound care center today with an open arterial ulcer on the left foot big toe and lateral heal. She also has some superficial ulcerations on the second and third toe dorsum. she has no family member with her today and she is a very poor historian and cannot give a proper history. As stated that she walks around with a walker. Electronic Signature(s) Signed: 06/22/2015 8:39:29 AM By: Christin Fudge MD, FACS Entered By: Christin Fudge on 06/22/2015 08:39:29 [name] ([id number]) -------------------------------------------------------------------------------- Cellular or Tissue Based Product Details Patient Name: [name] Date of Service: 06/22/2015 8:00 AM Medical Record Number: [id number] Patient Account Number: [id number] Date of Birth/Sex: [birth year] (81 y.o. Female) Treating RN: Cornell Barman Primary Care Physician: SYSTEM, PCP Other Clinician: Referring Physician: Constance Goltz Treating Physician/Extender: Frann Rider in Treatment: 53 Cellular or Tissue Based Wound #6 Left Achilles Product Type Applied to: Performed By: Physician Christin Fudge, MD Cellular or Tissue Based Other Product Type: Time-Out Taken: Yes Location: trunk / arms / legs Wound Size (sq cm): 0.42 Product Size (sq cm): 4 Waste Size (sq cm): 3.5 Waste Reason: wound size Amount of Product Applied (sq cm): 0.5 Lot #: [id number] Expiration Date: 03/26/2016 Fenestrated:  Yes Reconstituted: Yes Solution Type: saline Solution Amount: 41ml Lot #: [id number] Solution Expiration 02/15/2017 Date: Secured: Yes Secured With: Steri-Strips Dressing Applied: Yes Primary Dressing: mepitel Procedural Pain: 0 Post Procedural Pain: 0 Response to Treatment: Procedure was tolerated well Post Procedure Diagnosis Same as Pre-procedure Electronic Signature(s) Signed: 06/22/2015 8:56:10 AM By: Christin Fudge MD, FACS Entered By: Christin Fudge on 06/22/2015 08:56:10 [name] ([id number]) -------------------------------------------------------------------------------- HPI Details Patient Name: [name] Date of Service: 06/22/2015 8:00 AM Medical Record Number: [id number] Patient Account Number: [id number] Date of Birth/Sex: 1927/11/16 (81 y.o. Female) Treating RN: Cornell Barman Primary Care Physician: SYSTEM, PCP Other Clinician: Referring Physician: Constance Goltz Treating Physician/Extender: Frann Rider in Treatment: 40 History of Present Illness Location: left heel Quality: Patient reports experiencing a dull pain to affected area(s). Severity: Patient states wound are getting worse. Duration: Patient states that they are not certain how long the wound has been present. Timing: Pain in wound is Intermittent (comes and goes Context: The wound appeared gradually over time Associated Signs and Symptoms: Patient reports having difficulty standing for long periods. HPI Description: This 80 year old patient is a poor historian and comes from a nursing home. Does not have any family members with her. Says she has a ulcer on the lateral part of her left foot and some new ones are there on her right foot too. She is unable to say how long she's had these. she does walk with a walker and this is limited most of the day. Does not recall if she has had any vascular studies done. I have reviewed an x-ray done of the left foot which basically shows osteoporosis but no  evidence of fracture dislocation or osteomyelitis. 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 She seems to be doing better overall. 10/19/14 -- I understand she has gone to the vascular surgery and lab yesterday and has had all her tests done yesterday. She is doing fine otherwise and has had no fresh issues. 10/26/2014 she seems to be doing well and has no fresh problems and seems much brighter. 10/12/14 -- Taking her antibiotic and continues to do her dressings locally and she is helped by her skilled nursing. She seems to be doing fine and has no fresh issues. 10/19/14 -- she is doing fine and has had no fresh issues. She says her son to go for her vascular workup yesterday and she has been called back after 3 months. we will try and gather these reports to review what they said. 10/26/14 --I have been able to review notes from the vascular surgery office and these were dated from 10/18/2014. He patient was on her first postop visit and had Doppler ultrasounds done of her arteries. She had more than 50% stenosis of the right superficial femoral artery and more than 50% stenosis in the left tibioperoneal trunk. She was status post a left lower extremity angiogram with angioplasty of the peroneal and left superficial femoral arteries for a nonhealing left foot and ankle ulceration. note is made of the fact that the ABIs had not changed in the last month since her previous visit. The opinion of her provider was that she did not need any intervention at this time and they had done everything they could at the present time. They would continue on Plavix and antibiotics and see her back in 3 months for a another arterial duplex study of the lower extremities. [name], [name] ([id number]) 11/02/2014 -- she has spoken to her caregivers  about coming for HBO 5 times per week for 6-8 weeks and they are agreeable about this and she is willing to undergo hyperbaric oxygen therapy. 11/16/2014 she has been worked up with a EKG and chest x-ray and these were within normal limits. As far as the investigations go she is ready for HBOT. we are awaiting some insurance clearance so that she can start on her hyperbaric oxygen therapy. 11/30/2014 she tolerated hyperbaric oxygen therapy fine and there were no issues with her blood glucose levels. As noted she is not a diabetic and most of her fingerstick blood glucose levels are inaccurate because of the Raynaud's phenomena. She normally has to have your lobe sticks to get any random glucose levels. I have also requested her primary care does a venous blood glucose draw to check her glucose level. 12/21/2014 -- she is tolerating hyperbaric oxygen very well and has overall made a lot of progress. Her left leg is looking much better. 01/11/2015 - No new complaints today. New ulceration on the dorsum of the right second toe noted today on physical exam. Patient unaware. No significant pain. No fever or chills. No significant drainage. Offloading with sage boots at night. Significantly improved with HBO. 01/19/2015 - the patient has had a x-ray of the right foot done at the nursing home but we still do not have the report and we are awaiting this result. She is otherwise doing fine with her general health. 01/25/2015 -- X-ray of her right foot showed no acute fracture, dislocation, bony infection or osteomyelitis. The was some soft tissue swelling and osteopenia seen. 02/15/2015 -- due to some insurance issues we could not get her epifix and we are working with her company to get her this. She is  doing fine otherwise and has no fresh issues. 04/04/2015 -- she had a follow-up with the vascular surgeon and he seemed to be pleased with her progress and has had no problems with her  vascularity. 05/04/2015 -- the wound on the left posterior ankle had stalled and after talking to the [employer] we have obtained her samples of PuraPly and we will use this on a weekly basis. 05/10/2015 - .here for the second application of puraply. 05/17/2015 -- she is here for her third application of puraply [id number] ---here for the fifth application of puraply [id number] -- last week she had her sixth application of Puraply and this week will be the seventh application of Puraply. 06/22/2015 -- she has no fresh issues and this week will be the eighth application of Puraply. Electronic Signature(s) Signed: 06/22/2015 8:40:37 AM By: Christin Fudge MD, FACS Entered By: Christin Fudge on 06/22/2015 08:40:37 [name] ([id number]) -------------------------------------------------------------------------------- Physical Exam Details Patient Name: [name] Date of Service: 06/22/2015 8:00 AM Medical Record Number: [id number] Patient Account Number: [id number] Date of Birth/Sex: 07-11-1928 (81 y.o. Female) Treating RN: Cornell Barman Primary Care Physician: SYSTEM, PCP Other Clinician: Referring Physician: Constance Goltz Treating Physician/Extender: Frann Rider in Treatment: 40 Constitutional . Pulse regular. Respirations normal and unlabored. Afebrile. . Eyes Nonicteric. Reactive to light. Ears, Nose, Mouth, and Throat Lips, teeth, and gums WNL.Marland Kitchen Moist mucosa without lesions . Neck supple and nontender. No palpable supraclavicular or cervical adenopathy. Normal sized without goiter. Respiratory WNL. No retractions.. Cardiovascular Pedal Pulses WNL. No clubbing, cyanosis or edema. Lymphatic No adneopathy. No adenopathy. No adenopathy. Musculoskeletal Adexa without tenderness or enlargement.. Digits and nails w/o clubbing, cyanosis, infection, petechiae, ischemia, or inflammatory conditions.. Integumentary (Hair, Skin) No suspicious lesions. No crepitus or  fluctuance. No peri-wound warmth or erythema. No masses.Marland Kitchen Psychiatric Judgement and insight Intact.. No evidence of depression, anxiety, or agitation.. Notes the wound has come in by 0.2 cm and overall is looking cleaner and smaller. Electronic Signature(s) Signed: 06/22/2015 8:41:11 AM By: Christin Fudge MD, FACS Entered By: Christin Fudge on 06/22/2015 08:41:11 [name] ([id number]) -------------------------------------------------------------------------------- Physician Orders Details Patient Name: [name] Date of Service: 06/22/2015 8:00 AM Medical Record Number: [id number] Patient Account Number: [id number] Date of Birth/Sex: 1927-08-20 (81 y.o. 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 Anesthetic Wound #6 Left Achilles o Topical Lidocaine 4% cream applied to wound bed prior to debridement Skin Barriers/Peri-Wound Care Wound #6 Left Achilles o Skin Prep o Moisturizing lotion - to foot Primary Wound Dressing Wound #6 Left Achilles o Other: - PuraPly antimicrobial wound matrix Secondary Dressing Wound #6 Left Achilles o Gauze and Kerlix/Conform - coban lightly to secure - only around ankle Dressing Change Frequency Wound #6 Left Achilles o Change dressing every week Follow-up Appointments Wound #6 Left Achilles o Return Appointment  in 1 week. [place], [place] ([id number]) Electronic Signature(s) Signed: 06/22/2015 10:44:08 AM By: Montey Hora Signed: 06/22/2015 3:45:45 PM By: Christin Fudge MD, FACS Entered By: Montey Hora on  06/22/2015 10:44:08 [name], [name] ([id number]) -------------------------------------------------------------------------------- Problem List Details Patient Name: [name], [name] Date of Service: 06/22/2015 8:00 AM Medical Record Number: [id number] Patient Account Number: [id number] Date of Birth/Sex: 1927/10/25 (81 y.o. Female) Treating RN: Cornell Barman Primary Care Physician: SYSTEM, PCP Other Clinician: Referring Physician: Constance Goltz Treating Physician/Extender: Frann Rider in Treatment: 48 Active Problems ICD-10 Encounter Code Description Active Date Diagnosis L97.422 Non-pressure chronic ulcer of left heel and midfoot with fat 09/12/2014 Yes layer exposed 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 09/12/2014 Yes of heel and midfoot I70.235 Atherosclerosis of native arteries of right leg with 09/12/2014 Yes ulceration of other part of foot Inactive Problems Resolved Problems ICD-10 Code Description Active Date Resolved Date L97.412 Non-pressure chronic ulcer of right heel and midfoot with 10/26/2014 10/26/2014 fat layer exposed S91.104A Unspecified open wound of right lesser toe(s) without 01/11/2015 01/11/2015 damage to nail, initial encounter Electronic Signature(s) Signed: 06/22/2015 8:39:19 AM By: Christin Fudge MD, FACS Entered By: Christin Fudge on 06/22/2015 08:39:19 [name] ([id number]) -------------------------------------------------------------------------------- Progress Note Details Patient Name: [name] Date of Service: 06/22/2015 8:00 AM Medical Record Number: [id number] Patient Account Number: [id number] Date of Birth/Sex: 07-09-1928 (81 y.o. Female) Treating RN: Cornell Barman Primary Care Physician: SYSTEM, PCP Other  Clinician: Referring Physician: Constance Goltz Treating Physician/Extender: Frann Rider in Treatment: 40 Subjective Chief Complaint Information obtained from Patient Patient presents to the wound care center today with an open arterial ulcer on the left foot big toe and lateral heal. She also has some superficial ulcerations on the second and third toe dorsum. she has no family member with her today and she is a very poor historian and cannot give a proper history. As stated that she walks around with a walker. History of Present Illness (HPI) The following HPI elements were documented for the patient's wound: Location: left heel Quality: Patient reports experiencing a dull pain to affected area(s). Severity: Patient states wound are getting worse. Duration: Patient states that they are not certain how long the wound has been present. Timing: Pain in wound is Intermittent (comes and goes Context: The wound appeared gradually over time Associated Signs and Symptoms: Patient reports having difficulty standing for long periods. This 81 year old patient is a poor historian and comes from a nursing home. Does not have any family members with her. Says she has a ulcer on the lateral part of her left foot and some new ones are there on her right foot too. She is unable to say how long she's had these. she does walk with a walker and this is limited most of the day. Does not recall if she has had any vascular studies done. I have reviewed an x-ray done of the left foot which basically shows osteoporosis but no evidence of fracture dislocation or osteomyelitis. 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 She seems to be doing better overall. 10/19/14 -- I understand she has gone to the vascular surgery and lab yesterday and  has had all her tests done yesterday. She is doing fine otherwise and has had no fresh issues. 10/26/2014 she seems to be doing well and has no fresh problems and seems much brighter. 10/12/14 -- Taking her antibiotic and continues to do her dressings locally and she is helped by her skilled nursing. She seems to be  doing fine and has no fresh issues. 10/19/14 -- she is doing fine and has had no fresh issues. She says her son to go for her vascular workup yesterday and she has been called back after 3 months. we will try and gather these reports to review what [name], [name] ([id number]) they said. 10/26/14 --I have been able to review notes from the vascular surgery office and these were dated from 10/18/2014. He patient was on her first postop visit and had Doppler ultrasounds done of her arteries. She had more than 50% stenosis of the right superficial femoral artery and more than 50% stenosis in the left tibioperoneal trunk. She was status post a left lower extremity angiogram with angioplasty of the peroneal and left superficial femoral arteries for a nonhealing left foot and ankle ulceration. note is made of the fact that the ABIs had not changed in the last month since her previous visit. The opinion of her provider was that she did not need any intervention at this time and they had done everything they could at the present time. They would continue on Plavix and antibiotics and see her back in 3 months for a another arterial duplex study of the lower extremities. 11/02/2014 -- she has spoken to her caregivers about coming for HBO 5 times per week for 6-8 weeks and they are agreeable about this and she is willing to undergo hyperbaric oxygen therapy. 11/16/2014 she has been worked up with a EKG and chest x-ray and these were within normal limits. As far as the investigations go she is ready for HBOT. we are awaiting some insurance clearance so that she can start on her hyperbaric oxygen  therapy. 11/30/2014 she tolerated hyperbaric oxygen therapy fine and there were no issues with her blood glucose levels. As noted she is not a diabetic and most of her fingerstick blood glucose levels are inaccurate because of the Raynaud's phenomena. She normally has to have your lobe sticks to get any random glucose levels. I have also requested her primary care does a venous blood glucose draw to check her glucose level. 12/21/2014 -- she is tolerating hyperbaric oxygen very well and has overall made a lot of progress. Her left leg is looking much better. 01/11/2015 - No new complaints today. New ulceration on the dorsum of the right second toe noted today on physical exam. Patient unaware. No significant pain. No fever or chills. No significant drainage. Offloading with sage boots at night. Significantly improved with HBO. 01/19/2015 - the patient has had a x-ray of the right foot done at the nursing home but we still do not have the report and we are awaiting this result. She is otherwise doing fine with her general health. 01/25/2015 -- X-ray of her right foot showed no acute fracture, dislocation, bony infection or osteomyelitis. The was some soft tissue swelling and osteopenia seen. 02/15/2015 -- due to some insurance issues we could not get her epifix and we are working with her company to get her this. She is doing fine otherwise and has no fresh issues. 04/04/2015 -- she had a follow-up with the vascular surgeon and he seemed to be pleased with her progress and has had no problems with her vascularity. 05/04/2015 -- the wound on the left posterior ankle had stalled and after talking to the [employer] we have obtained her samples of PuraPly and we will use this on a weekly basis. 05/10/2015 - .here for the second application of puraply. 05/17/2015 -- she is  here for her third application of puraply [id number] ---here for the fifth application of puraply [id number] -- last  week she had her sixth application of Puraply and this week will be the seventh application of Puraply. 06/22/2015 -- she has no fresh issues and this week will be the eighth application of Puraply. [name], [name] ([id number]) Objective Constitutional Pulse regular. Respirations normal and unlabored. Afebrile. Vitals Time Taken: 8:10 AM, Height: 64 in, Weight: 180 lbs, BMI: 30.9, Temperature: 98.2 F, Pulse: 70 bpm, Respiratory Rate: 18 breaths/min, Blood Pressure: 119/46 mmHg. Eyes Nonicteric. Reactive to light. Ears, Nose, Mouth, and Throat Lips, teeth, and gums WNL.Marland Kitchen Moist mucosa without lesions . Neck supple and nontender. No palpable supraclavicular or cervical adenopathy. Normal sized without goiter. Respiratory WNL. No retractions.. Cardiovascular Pedal Pulses WNL. No clubbing, cyanosis or edema. Lymphatic No adneopathy. No adenopathy. No adenopathy. Musculoskeletal Adexa without tenderness or enlargement.. Digits and nails w/o clubbing, cyanosis, infection, petechiae, ischemia, or inflammatory conditions.Marland Kitchen Psychiatric Judgement and insight Intact.. No evidence of depression, anxiety, or agitation.. General Notes: the wound has come in by 0.2 cm and overall is looking cleaner and smaller. Integumentary (Hair, Skin) No suspicious lesions. No crepitus or fluctuance. No peri-wound warmth or erythema. No masses.. Wound #6 status is Open. Original cause of wound was Gradually Appeared. The wound is located on the Left Achilles. The wound measures 0.6cm length x 0.7cm width x 0.1cm depth; 0.33cm^2 area and 0.033cm^3 volume. The wound is limited to skin breakdown. There is no tunneling or undermining noted. There is a small amount of serous drainage noted. The wound margin is distinct with the outline attached to [place], [place] ([id number]) the wound base. There is medium (34-66%) red granulation within the wound bed. There is a small (1-33%) amount of necrotic tissue within the  wound bed including Adherent Slough. The periwound skin appearance exhibited: Localized Edema, Moist, Hemosiderin Staining. The periwound skin appearance did not exhibit: Callus, Crepitus, Excoriation, Fluctuance, Friable, Induration, Rash, Scarring, Dry/Scaly, Maceration, Atrophie Blanche, Cyanosis, Ecchymosis, Mottled, Pallor, Rubor, Erythema. Periwound temperature was noted as No Abnormality. 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 A skin graft procedure using a bioengineered skin substitute/cellular or tissue based product was performed by Christin Fudge, MD. Other was applied and secured with Steri-Strips. 0.5 sq cm of product was utilized and 3.5 sq cm was wasted due to wound size. Post Application, mepitel was applied. A Time Out was conducted prior to the start of the procedure. The procedure was tolerated well with a pain level of 0 throughout and a pain level of 0 following the procedure. Post procedure Diagnosis Wound #6: Same as Pre-Procedure . With the usual precautions and sterile technique Puraply was applied and bolstered in place. She will be back next week for review. Plan Wound Cleansing: Wound #6 Left Achilles: Clean wound with Normal Saline. [place], [place] ([id number]) Anesthetic: Wound #6 Left Achilles: Topical Lidocaine 4% cream applied to wound bed prior to debridement Skin Barriers/Peri-Wound Care: Wound #6 Left Achilles: Skin Prep Moisturizing lotion - to foot Primary Wound Dressing: Wound #6 Left Achilles: Other: - PuraPly antimicrobial wound matrix Secondary Dressing: Wound #6 Left  Achilles: Gauze and Kerlix/Conform -  coban lightly to secure - only around ankle Dressing Change Frequency: Wound #6 Left Achilles: Change dressing every week Follow-up Appointments: Wound #6 Left Achilles: Return Appointment in 1 week. With the usual precautions and sterile technique Puraply was applied and bolstered in place. She will be back next week for review. Electronic Signature(s) Signed: 06/22/2015 3:47:36 PM By: Christin Fudge MD, FACS Previous Signature: 06/22/2015 8:56:28 AM Version By: Christin Fudge MD, FACS Previous Signature: 06/22/2015 8:41:56 AM Version By: Christin Fudge MD, FACS Entered By: Christin Fudge on 06/22/2015 15:47:35 [name] ([id number]) -------------------------------------------------------------------------------- SuperBill Details Patient Name: [name] Date of Service: 06/22/2015 Medical Record Number: [id number] Patient Account Number: [id number] Date of Birth/Sex: [birth year] (80 y.o. 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 I70.235 Atherosclerosis of native arteries of right leg with ulceration of other part of foot Facility Procedures CPT4: Description Modifier Quantity Code [id number] - SKIN SUB GRAFT TRNK/ARM/LEG 1 ICD-10 Description Diagnosis 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 of native arteries of right leg with ulceration of other part of foot Physician Procedures CPT4: Description Modifier Quantity Code  [id number] - WC PHYS SKIN SUB GRAFT TRNK/ARM/LEG 1 ICD-10 Description Diagnosis L97.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 I70.244 Atherosclerosis of native arteries of  left leg with ulceration of heel and midfoot I70.235 Atherosclerosis of native arteries of right leg with ulceration of other part of foot Electronic Signature(s) Signed: 06/22/2015 8:56:46 AM By: Christin Fudge MD, FACS Entered By: Christin Fudge on 06/22/2015 08:56:46

## 2015-06-23 NOTE — Progress Notes (Signed)
[name] ([id number]) Visit Report for 06/22/2015 Arrival Information Details Patient Name: [name], [name] Date of Service: 06/22/2015 8:00 AM Medical Record Number: [id number] Patient Account Number: [id number] Date of Birth/Sex: July 19, 1928 (81 y.o. Female) Treating RN: Montey Hora Primary Care Physician: SYSTEM, PCP Other Clinician: Referring Physician: Constance Goltz Treating Physician/Extender: Frann Rider in Treatment: 56 Visit Information History Since Last Visit Added or deleted any medications: No Patient Arrived: Wheel Chair Any new allergies or adverse reactions: No Arrival Time: 08:05 Had a fall or experienced change in No Accompanied By: self activities of daily living that may affect Transfer Assistance: None risk of falls: Patient Identification Verified: Yes Signs or symptoms of abuse/neglect since last No Secondary Verification Process Yes visito Completed: Hospitalized since last visit: No Patient Requires Transmission- No Pain Present Now: No Based Precautions: Patient Has Alerts: Yes Patient Alerts: Patient on Blood Thinner Electronic Signature(s) Signed: 06/22/2015 4:46:28 PM By: Montey Hora Entered By: Montey Hora on 06/22/2015 08:08:39 [name] ([id number]) -------------------------------------------------------------------------------- Encounter Discharge Information Details Patient Name: [name] Date of Service: 06/22/2015 8:00 AM Medical Record Number: [id number] Patient Account Number: [id number] Date of Birth/Sex: Jan 03, 1928 (81 y.o. Female) Treating RN: Cornell Barman Primary Care Physician: SYSTEM, PCP Other Clinician: Referring Physician: Constance Goltz Treating Physician/Extender: Frann Rider in Treatment: 7 Encounter Discharge Information Items Discharge Pain Level: 0 Discharge Condition: Stable Ambulatory Status: Wheelchair Discharge Destination: Home Transportation: Private Auto Accompanied By:  self Schedule Follow-up Appointment: Yes Medication Reconciliation completed and provided to Patient/Care No [name]: Provided on Clinical Summary of Care: 06/22/2015 Form Type Recipient Paper Patient DJ Electronic Signature(s) Signed: 06/22/2015 12:05:20 PM By: Montey Hora Previous Signature: 06/22/2015 8:48:59 AM Version By: Ruthine Dose Entered By: Montey Hora on 06/22/2015 12:05:20 [name] ([id number]) -------------------------------------------------------------------------------- Lower Extremity Assessment Details Patient Name: [name] Date of Service: 06/22/2015 8:00 AM Medical Record Number: [id number] Patient Account Number: [id number] Date of Birth/Sex: 10-01-1927 (81 y.o. Female) Treating RN: Montey Hora Primary Care Physician: SYSTEM, PCP Other Clinician: Referring Physician: Constance Goltz Treating Physician/Extender: Frann Rider in Treatment: 40 Vascular Assessment Pulses: Posterior Tibial Dorsalis Pedis Palpable: [Left:Yes] Extremity colors, hair growth, and conditions: Extremity Color: [Left:Normal] Hair Growth on Extremity: [Left:No] Temperature of Extremity: [Left:Warm] Capillary Refill: [Left:< 3 seconds] Toe Nail Assessment Left: Right: Thick: Yes Discolored: Yes Deformed: No Improper Length and Hygiene: No Electronic Signature(s) Signed: 06/22/2015 4:46:28 PM By: Montey Hora Entered By: Montey Hora on 06/22/2015 08:16:51 [name] ([id number]) -------------------------------------------------------------------------------- Multi Wound Chart Details Patient Name: [name] Date of Service: 06/22/2015 8:00 AM Medical Record Number: [id number] Patient Account Number: [id number] Date of Birth/Sex: May 25, 1928 (81 y.o. 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 N/A] Comorbid History: [6:Glaucoma, Asthma, Hypertension, Peripheral Venous Disease, Type II Diabetes] [N/A:N/A] Date Acquired: [6:09/26/2014] [N/A:N/A] Weeks of Treatment: [6:37] [N/A:N/A] Wound Status: [6:Open] [N/A:N/A] Measurements L x W x D 0.6x0.7x0.1 [N/A:N/A] (cm) Area (cm) : [6:0.33] [N/A:N/A] Volume (cm) : [6:0.033] [N/A:N/A] % Reduction in Area: [6:91.20%] [N/A:N/A] % Reduction in Volume: 98.20% [N/A:N/A] Classification: [6:Partial Thickness] [N/A:N/A] HBO Classification: [6:Grade 1] [N/A:N/A] Exudate Amount: [6:Small] [N/A:N/A] Exudate Type: [6:Serous] [N/A:N/A] Exudate Color: [6:amber] [N/A:N/A] Wound Margin: [6:Distinct, outline attached N/A]  Granulation Amount: [6:Medium (34-66%)] [N/A:N/A] Granulation Quality: [6:Red] [N/A:N/A] Necrotic Amount: [6:Small (1-33%)] [N/A:N/A] Exposed Structures: [6:Fascia: No Fat: No Tendon: No Muscle: No] [N/A:N/A] Joint: No Bone: No Limited to Skin Breakdown Epithelialization: Medium (34-66%) N/A N/A Periwound Skin Texture: Edema: Yes N/A N/A Excoriation: No Induration: No Callus: No Crepitus: No Fluctuance: No Friable: No Rash: No Scarring: No Periwound Skin Moist: Yes N/A N/A Moisture: Maceration: No Dry/Scaly: No Periwound Skin Color: Hemosiderin Staining: Yes N/A N/A Atrophie Blanche: No Cyanosis: No Ecchymosis: No Erythema: No Mottled: No Pallor: No Rubor: No Temperature: No Abnormality N/A N/A Tenderness on No N/A N/A Palpation: Wound Preparation: Ulcer Cleansing: N/A N/A Rinsed/Irrigated with Saline Topical Anesthetic Applied: Other:  lidocaine 4% Treatment Notes Electronic Signature(s) Signed: 06/22/2015 4:46:28 PM By: Montey Hora Entered By: Montey Hora on 06/22/2015 08:18:51 [name] ([id number]) -------------------------------------------------------------------------------- Multi-Disciplinary Care Plan Details Patient Name: [name] Date of Service: 06/22/2015 8:00 AM Medical Record Number: [id number] Patient Account Number: [id number] Date of Birth/Sex: Dec 19, 1927 (81 y.o. Female) Treating RN: Montey Hora Primary Care Physician: SYSTEM, PCP Other Clinician: Referring Physician: Constance Goltz Treating Physician/Extender: Frann Rider in Treatment: 31 Active Inactive Abuse / Safety / Falls / Self Care Management Nursing Diagnoses: Impaired physical mobility Potential for falls Goals: Patient will remain injury free Date Initiated: 09/12/2014 Goal Status: Active Patient/caregiver will verbalize understanding of skin care regimen Date Initiated: 09/12/2014 Goal Status: Active Patient/caregiver will verbalize/demonstrate measures taken to prevent injury and/or falls Date Initiated: 09/12/2014 Goal Status: Active Patient/caregiver will verbalize/demonstrate understanding of what to do in case of emergency Date Initiated: 09/12/2014 Goal Status: Active Interventions: Assess fall risk on admission and as needed Assess: immobility, friction, shearing, incontinence upon admission and as needed Assess impairment of mobility on admission and as needed per policy Provide education on basic hygiene Provide education on fall prevention Provide education on personal and home safety Provide education on safe transfers Treatment Activities: Education provided on Basic Hygiene : 10/12/2014 Notes: Necrotic Tissue [name], [name] ([id number]) Nursing Diagnoses: Impaired tissue integrity related to necrotic/devitalized tissue Knowledge deficit related to management of necrotic/devitalized  tissue Goals: Necrotic/devitalized tissue will be minimized in the wound bed Date Initiated: 09/12/2014 Goal Status: Active Patient/caregiver will verbalize understanding of reason and process for debridement of necrotic tissue Date Initiated: 09/12/2014 Goal Status: Active Interventions: Assess patient pain level pre-, during and post procedure and prior to discharge Provide education on necrotic tissue and debridement process Treatment Activities: Apply topical anesthetic as ordered : 06/22/2015 Enzymatic debridement : 06/22/2015 Excisional debridement : 06/22/2015 Notes: Orientation to the Wound Care Program Nursing Diagnoses: Knowledge deficit related to the wound healing center program Goals: Patient/caregiver will verbalize understanding of the [facility] Date Initiated: 09/12/2014 Goal Status: Active Interventions: Provide education on orientation to the wound center Notes: Pressure Nursing Diagnoses: Knowledge deficit related to causes and risk factors for pressure ulcer development Knowledge deficit related to management of pressures ulcers Potential for impaired tissue integrity related to pressure, friction, moisture, and shear GoalsBRINAE, [name] ([id number]) Patient will remain free from development of additional pressure ulcers Date Initiated: 09/12/2014 Goal Status: Active Patient will remain free of pressure ulcers Date Initiated: 09/12/2014 Goal Status: Active Patient/caregiver will verbalize risk factors for pressure ulcer development Date Initiated: 09/12/2014 Goal Status: Active Patient/caregiver will verbalize understanding of pressure ulcer management Date Initiated: 09/12/2014 Goal Status: Active Interventions: Assess: immobility, friction, shearing, incontinence upon admission and as needed Assess offloading mechanisms upon admission and as needed Assess potential for pressure ulcer  upon admission and as needed Provide education on  pressure ulcers Treatment Activities: Pressure reduction/relief device ordered : 06/22/2015 Notes: Wound/Skin Impairment Nursing Diagnoses: Impaired tissue integrity Knowledge deficit related to ulceration/compromised skin integrity Goals: Patient/caregiver will verbalize understanding of skin care regimen Date Initiated: 09/12/2014 Goal Status: Active Ulcer/skin breakdown will heal within 14 weeks Date Initiated: 09/12/2014 Goal Status: Active Interventions: Assess patient/caregiver ability to obtain necessary supplies Assess patient/caregiver ability to perform ulcer/skin care regimen upon admission and as needed Assess ulceration(s) every visit Provide education on ulcer and skin care Treatment Activities: Skin care regimen initiated : 06/22/2015 [name], [name] ([id number]) Topical wound management initiated : 06/22/2015 Notes: Electronic Signature(s) Signed: 06/22/2015 4:46:28 PM By: Montey Hora Entered By: Montey Hora on 06/22/2015 08:18:43 [name] ([id number]) -------------------------------------------------------------------------------- Patient/Caregiver Education Details Patient Name: [name] Date of Service: 06/22/2015 8:00 AM Medical Record Number: [id number] Patient Account Number: [id number] Date of Birth/Gender: [birth year] (81 y.o. Female) Treating RN: Montey Hora Primary Care Physician: SYSTEM, PCP Other Clinician: Referring Physician: Constance Goltz Treating Physician/Extender: Frann Rider in Treatment: 37 Education Assessment Education Provided To: Patient Education Topics Provided Wound/Skin Impairment: Handouts: Other: keep dressin intact this week Methods: Demonstration, Explain/Verbal Responses: State content correctly Electronic Signature(s) Signed: 06/22/2015 12:05:59 PM By: Montey Hora Entered By: Montey Hora on 06/22/2015 12:05:59 [name]  ([id number]) -------------------------------------------------------------------------------- Wound Assessment Details Patient Name: [name] Date of Service: 06/22/2015 8:00 AM Medical Record Number: [id number] Patient Account Number: [id number] Date of Birth/Sex: 22-Aug-1927 (81 y.o. Female) Treating RN: Montey Hora Primary Care Physician: SYSTEM, PCP Other Clinician: Referring Physician: Constance Goltz Treating Physician/Extender: Frann Rider in Treatment: 40 Wound Status Wound Number: 6 Primary Arterial Insufficiency Ulcer Etiology: Wound Location: Left Achilles Wound Open Wounding Event: Gradually Appeared Status: Date Acquired: 09/26/2014 Comorbid Glaucoma, Asthma, Hypertension, Weeks Of Treatment: 37 History: Peripheral Venous Disease, Type II Clustered Wound: No Diabetes Photos Photo Uploaded By: Montey Hora on 06/22/2015 12:14:36 Wound Measurements Length: (cm) 0.6 Width: (cm) 0.7 Depth: (cm) 0.1 Area: (cm) 0.33 Volume: (cm) 0.033 % Reduction in Area: 91.2% % Reduction in Volume: 98.2% Epithelialization: Medium (34-66%) Tunneling: No Undermining: No Wound Description Classification: Partial Thickness Foul O Diabetic Severity (Wagner): Grade 1 Wound Margin: Distinct, outline attached Exudate Amount: Small Exudate Type: Serous Exudate Color: amber dor After Cleansing: No Wound Bed Granulation Amount: Medium (34-66%) Exposed Structure Granulation Quality: Red Fascia Exposed: No Necrotic Amount: Small (1-33%) Fat Layer Exposed: No [name], [name] ([id number]) Necrotic Quality: Adherent Slough Tendon Exposed: No Muscle Exposed: No Joint Exposed: No Bone Exposed: No Limited to Skin Breakdown Periwound Skin Texture Texture Color No Abnormalities Noted: No No Abnormalities Noted: No Callus: No Atrophie Blanche: No Crepitus: No Cyanosis: No Excoriation: No Ecchymosis: No Fluctuance: No Erythema: No Friable: No Hemosiderin  Staining: Yes Induration: No Mottled: No Localized Edema: Yes Pallor: No Rash: No Rubor: No Scarring: No Temperature / Pain Moisture Temperature: No Abnormality No Abnormalities Noted: No Dry / Scaly: No Maceration: No Moist: Yes Wound Preparation Ulcer Cleansing: Rinsed/Irrigated with Saline Topical Anesthetic Applied: Other: lidocaine 4%, Treatment Notes Wound #6 (Left Achilles) 1. Cleansed with: Clean wound with Normal Saline 2. Anesthetic Topical Lidocaine 4% cream to wound bed prior to debridement 3. Peri-wound Care: Moisturizing lotion Skin Prep 4. Dressing Applied: Other dressing (specify in notes) 5. Secondary Dressing Applied Kerlix/Conform Saline moistened guaze 7. Secured with Tape Notes Puraply applied, secured lightly with Tedra Coupe, [name] ([id number]) Electronic Signature(s) Signed: 06/22/2015 4:46:28 PM By: Montey Hora Entered By: Montey Hora on  06/22/2015 08:15:29 [name], [name] ([id number]) -------------------------------------------------------------------------------- Vitals Details Patient Name: [name] Date of Service: 06/22/2015 8:00 AM Medical Record Number: [id number] Patient Account Number: [id number] Date of Birth/Sex: 10-Jun-1928 (81 y.o. Female) Treating RN: Montey Hora Primary Care Physician: SYSTEM, PCP Other Clinician: Referring Physician: Constance Goltz Treating Physician/Extender: Frann Rider in Treatment: 40 Vital Signs Time Taken: 08:10 Temperature (F): 98.2 Height (in): 64 Pulse (bpm): 70 Weight (lbs): 180 Respiratory Rate (breaths/min): 18 Body Mass Index (BMI): 30.9 Blood Pressure (mmHg): 119/46 Reference Range: 80 - 120 mg / dl Electronic Signature(s) Signed: 06/22/2015 4:46:28 PM By: Montey Hora Entered By: Montey Hora on 06/22/2015 08:10:32

## 2015-06-28 ENCOUNTER — Encounter: Payer: Medicare Other | Admitting: Surgery

## 2015-06-28 DIAGNOSIS — L97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: Secondary | ICD-10-CM | POA: Diagnosis not present

## 2015-06-28 NOTE — Progress Notes (Addendum)
MACEL, KOHEL ([id number]) Visit Report for 06/28/2015 Chief Complaint Document Details Patient Name: [name], [name] Date of Service: 06/28/2015 1:00 PM Medical Record Number: [id number] Patient Account Number: [id number] Date of Birth/Sex: [birth year] (81 y.o. Female) Treating RN: Cornell Barman Primary Care Physician: SYSTEM, PCP Other Clinician: Referring Physician: Constance Goltz Treating Physician/Extender: Frann Rider in Treatment: 29 Information Obtained from: Patient Chief Complaint Patient presents to the wound care center today with an open arterial ulcer on the left foot big toe and lateral heal. She also has some superficial ulcerations on the second and third toe dorsum. she has no family member with her today and she is a very poor historian and cannot give a proper history. As stated that she walks around with a walker. Electronic Signature(s) Signed: 06/28/2015 1:26:49 PM By: Christin Fudge MD, FACS Entered By: Christin Fudge on 06/28/2015 13:26:49 [name] ([id number]) -------------------------------------------------------------------------------- HPI Details Patient Name: [name] Date of Service: 06/28/2015 1:00 PM Medical Record Number: [id number] Patient Account Number: [id number] Date of Birth/Sex: 12-14-1927 (81 y.o. Female) Treating RN: Cornell Barman Primary Care Physician: SYSTEM, PCP Other Clinician: Referring Physician: Constance Goltz Treating Physician/Extender: Frann Rider in Treatment: 35 History of Present Illness Location: left heel Quality: Patient reports experiencing a dull pain to affected area(s). Severity: Patient states wound are getting worse. Duration: Patient states that they are not certain how long the wound has been present. Timing: Pain in wound is Intermittent (comes and goes Context: The wound appeared gradually over time Associated Signs and Symptoms: Patient reports having difficulty standing for long  periods. HPI Description: This 81 year old patient is a poor historian and comes from a nursing home. Does not have any family members with her. Says she has a ulcer on the lateral part of her left foot and some new ones are there on her right foot too. She is unable to say how long she's had these. she does walk with a walker and this is limited most of the day. Does not recall if she has had any vascular studies done. I have reviewed an x-ray done of the left foot which basically shows osteoporosis but no evidence of fracture dislocation or osteomyelitis. 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 She seems to be doing better overall. 10/19/14 -- I understand she has gone to the vascular surgery and lab yesterday and has had all her tests done yesterday. She is doing fine otherwise and has had no fresh issues. 10/26/2014 she seems to be doing well and has no fresh problems and seems much brighter. 10/12/14 -- Taking her antibiotic and continues to do her dressings locally and she is helped by her skilled nursing. She seems to be doing fine and has no fresh issues. 10/19/14 -- she is doing fine and has had no fresh issues. She says her son to go for her vascular workup yesterday and she has been called back after 3 months. we will try and gather these reports to review what they said. 10/26/14 --I have been able to review notes from the vascular surgery office and these were dated from 10/18/2014. He patient was on her first postop visit and had Doppler ultrasounds done of her arteries. She had more than 50% stenosis of the right superficial femoral artery and more than 50% stenosis in the left tibioperoneal trunk. She  was status post a left lower extremity angiogram with angioplasty of the peroneal and left superficial femoral  arteries for a nonhealing left foot and ankle ulceration. note is made of the fact that the ABIs had not changed in the last month since her previous visit. The opinion of her provider was that she did not need any intervention at this time and they had done everything they could at the present time. They would continue on Plavix and antibiotics and see her back in 3 months for a another arterial duplex study of the lower extremities. [name], [name] ([id number]) 11/02/2014 -- she has spoken to her caregivers about coming for HBO 5 times per week for 6-8 weeks and they are agreeable about this and she is willing to undergo hyperbaric oxygen therapy. 11/16/2014 she has been worked up with a EKG and chest x-ray and these were within normal limits. As far as the investigations go she is ready for HBOT. we are awaiting some insurance clearance so that she can start on her hyperbaric oxygen therapy. 11/30/2014 she tolerated hyperbaric oxygen therapy fine and there were no issues with her blood glucose levels. As noted she is not a diabetic and most of her fingerstick blood glucose levels are inaccurate because of the Raynaud's phenomena. She normally has to have your lobe sticks to get any random glucose levels. I have also requested her primary care does a venous blood glucose draw to check her glucose level. 12/21/2014 -- she is tolerating hyperbaric oxygen very well and has overall made a lot of progress. Her left leg is looking much better. 01/11/2015 - No new complaints today. New ulceration on the dorsum of the right second toe noted today on physical exam. Patient unaware. No significant pain. No fever or chills. No significant drainage. Offloading with sage boots at night. Significantly improved with HBO. 01/19/2015 - the patient has had a x-ray of the right foot done at the nursing home but we still do not have the report and we are awaiting this result. She is otherwise doing fine with her  general health. 01/25/2015 -- X-ray of her right foot showed no acute fracture, dislocation, bony infection or osteomyelitis. The was some soft tissue swelling and osteopenia seen. 02/15/2015 -- due to some insurance issues we could not get her epifix and we are working with her company to get her this. She is doing fine otherwise and has no fresh issues. 04/04/2015 -- she had a follow-up with the vascular surgeon and he seemed to be pleased with her progress and has had no problems with her vascularity. 05/04/2015 -- the wound on the left posterior ankle had stalled and after talking to the [employer] we have obtained her samples of PuraPly and we will use this on a weekly basis. 05/10/2015 - .here for the second application of puraply. 05/17/2015 -- she is here for her third application of puraply [id number] ---here for the fifth application of puraply [id number] -- last week she had her sixth application of Puraply and this week will be the seventh application of Puraply. 06/22/2015 -- she has no fresh issues and this week will be the eighth application of Puraply. 06/28/2015 -- she is here for review and after seeing a wound I have not applied Puraply. Electronic Signature(s) Signed: 06/28/2015 1:27:33 PM By: Christin Fudge MD, FACS Entered By: Christin Fudge on 06/28/2015 13:27:32 [name], [name] ([id number]) -------------------------------------------------------------------------------- Physical Exam Details Patient Name: [name] Date of Service: 06/28/2015 1:00 PM Medical Record Number:  [id number] Patient Account Number: [id number] Date of Birth/Sex: 1928-02-05 (81 y.o. Female) Treating RN: Cornell Barman Primary Care Physician: SYSTEM, PCP Other Clinician: Referring Physician: Constance Goltz Treating Physician/Extender: Frann Rider in Treatment: 41 Constitutional . Pulse regular. Respirations normal and unlabored. Afebrile. . Eyes Nonicteric. Reactive to  light. Ears, Nose, Mouth, and Throat Lips, teeth, and gums WNL.Marland Kitchen Moist mucosa without lesions . Neck supple and nontender. No palpable supraclavicular or cervical adenopathy. Normal sized without goiter. Respiratory WNL. No retractions.. Breath sounds WNL, No rubs, rales, rhonchi, or wheeze.. Cardiovascular Heart rhythm and rate regular, no murmur or gallop.. Pedal Pulses WNL. No clubbing, cyanosis or edema. Chest Breasts symmetical and no nipple discharge.. Breast tissue WNL, no masses, lumps, or tenderness.. Lymphatic No adneopathy. No adenopathy. No adenopathy. Musculoskeletal Adexa without tenderness or enlargement.. Digits and nails w/o clubbing, cyanosis, infection, petechiae, ischemia, or inflammatory conditions.. Integumentary (Hair, Skin) No suspicious lesions. No crepitus or fluctuance. No peri-wound warmth or erythema. No masses.Marland Kitchen Psychiatric Judgement and insight Intact.. No evidence of depression, anxiety, or agitation.. Notes This week the wound is completely epihelialized and there is no open area. Electronic Signature(s) Signed: 06/28/2015 1:28:21 PM By: Christin Fudge MD, FACS Entered By: Christin Fudge on 06/28/2015 13:28:20 [name] ([id number]) -------------------------------------------------------------------------------- Physician Orders Details Patient Name: [name] Date of Service: 06/28/2015 1:00 PM Medical Record Number: [id number] Patient Account Number: [id number] Date of Birth/Sex: 13-May-1928 (81 y.o. 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 Primary Wound Dressing o Other: - Mepilex lite secured with Coban Electronic Signature(s) Signed: 06/28/2015 4:04:17 PM By: Christin Fudge MD, FACS Signed: 06/29/2015 4:39:51 PM By: Gretta Cool RN, BSN, Kim RN, BSN Entered By: Gretta Cool, RN, BSN, Kim on 06/28/2015 13:28:46 [name] ([id number]) -------------------------------------------------------------------------------- Problem List Details Patient Name: [name] Date of Service: 06/28/2015 1:00 PM Medical Record Number: [id number] Patient Account Number: [id number] Date of Birth/Sex: 1927-08-23 (81 y.o. Female) Treating RN: Cornell Barman Primary Care Physician: SYSTEM, PCP Other Clinician: Referring Physician: Constance Goltz Treating Physician/Extender: Frann Rider in Treatment: 63 Active Problems ICD-10 Encounter Code Description Active Date Diagnosis L97.422 Non-pressure chronic ulcer of left heel and midfoot with fat 09/12/2014 Yes layer exposed 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 09/12/2014 Yes of heel and midfoot I70.235 Atherosclerosis of native arteries of right leg with 09/12/2014 Yes ulceration of other part of foot Inactive Problems Resolved Problems ICD-10 Code Description Active Date Resolved Date L97.412 Non-pressure chronic ulcer of right heel and midfoot with 10/26/2014 10/26/2014 fat layer exposed S91.104A Unspecified open wound of right lesser toe(s) without 01/11/2015 01/11/2015 damage to nail, initial encounter Electronic Signature(s) Signed: 06/28/2015 1:26:41 PM By: Christin Fudge MD, FACS Entered By: Christin Fudge on 06/28/2015 13:26:41 [name] ([id number]) -------------------------------------------------------------------------------- Progress Note Details Patient  Name: [name] Date of Service: 06/28/2015 1:00 PM Medical Record Number: [id number] Patient Account Number: [id number] Date of Birth/Sex: December 09, 1927 (81 y.o. Female) Treating RN: Cornell Barman Primary Care Physician: SYSTEM, PCP Other Clinician: Referring Physician: Constance Goltz Treating  Physician/Extender: Frann Rider in Treatment: 44 Subjective Chief Complaint Information obtained from Patient Patient presents to the wound care center today with an open arterial ulcer on the left foot big toe and lateral heal. She also has some superficial ulcerations on the second and third toe dorsum. she has no family member with her today and she is a very poor historian and cannot give a proper history. As stated that she walks around with a walker. History of Present Illness (HPI) The following HPI elements were documented for the patient's wound: Location: left heel Quality: Patient reports experiencing a dull pain to affected area(s). Severity: Patient states wound are getting worse. Duration: Patient states that they are not certain how long the wound has been present. Timing: Pain in wound is Intermittent (comes and goes Context: The wound appeared gradually over time Associated Signs and Symptoms: Patient reports having difficulty standing for long periods. This 81 year old patient is a poor historian and comes from a nursing home. Does not have any family members with her. Says she has a ulcer on the lateral part of her left foot and some new ones are there on her right foot too. She is unable to say how long she's had these. she does walk with a walker and this is limited most of the day. Does not recall if she has had any vascular studies done. I have reviewed an x-ray done of the left foot which basically shows osteoporosis but no evidence of fracture dislocation or osteomyelitis. her culture report is also back and she has grown a MRSA which is sensitive to  Tetracycline 10/12/14 -- She seems more alert today and says that she has already scheduled an appointment with the vascular surgeons. She seems to be doing better overall. 10/19/14 -- I understand she has gone to the vascular surgery and lab yesterday and has had all her tests done yesterday. She is doing fine otherwise and has had no fresh issues. 10/26/2014 she seems to be doing well and has no fresh problems and seems much brighter. 10/12/14 -- Taking her antibiotic and continues to do her dressings locally and she is helped by her skilled nursing. She seems to be doing fine and has no fresh issues. 10/19/14 -- she is doing fine and has had no fresh issues. She says her son to go for her vascular workup yesterday and she has been called back after 3 months. we will try and gather these reports to review what [name], [name] ([id number]) they said. 10/26/14 --I have been able to review notes from the vascular surgery office and these were dated from 10/18/2014. He patient was on her first postop visit and had Doppler ultrasounds done of her arteries. She had more than 50% stenosis of the right superficial femoral artery and more than 50% stenosis in the left tibioperoneal trunk. She was status post a left lower extremity angiogram with angioplasty of the peroneal and left superficial femoral arteries for a nonhealing left foot and ankle ulceration. note is made of the fact that the ABIs had not changed in the last month since her previous visit. The opinion of her provider was that she did not need any intervention at this time and they had done everything they could at the present time. They would continue on Plavix and antibiotics and see her back in 3 months for a another arterial duplex study of the lower extremities. 11/02/2014 -- she has spoken to her caregivers about coming for HBO 5 times  per week for 6-8 weeks and they are agreeable about this and she is willing to undergo hyperbaric  oxygen therapy. 11/16/2014 she has been worked up with a EKG and chest x-ray and these were within normal limits. As far as the investigations go she is ready for HBOT. we are awaiting some insurance clearance so that she can start on her hyperbaric oxygen therapy. 11/30/2014 she tolerated hyperbaric oxygen therapy fine and there were no issues with her blood glucose levels. As noted she is not a diabetic and most of her fingerstick blood glucose levels are inaccurate because of the Raynaud's phenomena. She normally has to have your lobe sticks to get any random glucose levels. I have also requested her primary care does a venous blood glucose draw to check her glucose level. 12/21/2014 -- she is tolerating hyperbaric oxygen very well and has overall made a lot of progress. Her left leg is looking much better. 01/11/2015 - No new complaints today. New ulceration on the dorsum of the right second toe noted today on physical exam. Patient unaware. No significant pain. No fever or chills. No significant drainage. Offloading with sage boots at night. Significantly improved with HBO. 01/19/2015 - the patient has had a x-ray of the right foot done at the nursing home but we still do not have the report and we are awaiting this result. She is otherwise doing fine with her general health. 01/25/2015 -- X-ray of her right foot showed no acute fracture, dislocation, bony infection or osteomyelitis. The was some soft tissue swelling and osteopenia seen. 02/15/2015 -- due to some insurance issues we could not get her epifix and we are working with her company to get her this. She is doing fine otherwise and has no fresh issues. 04/04/2015 -- she had a follow-up with the vascular surgeon and he seemed to be pleased with her progress and has had no problems with her vascularity. 05/04/2015 -- the wound on the left posterior ankle had stalled and after talking to the [employer] we have obtained her  samples of PuraPly and we will use this on a weekly basis. 05/10/2015 - .here for the second application of puraply. 05/17/2015 -- she is here for her third application of puraply [id number] ---here for the fifth application of puraply [id number] -- last week she had her sixth application of Puraply and this week will be the seventh application of Puraply. 06/22/2015 -- she has no fresh issues and this week will be the eighth application of Puraply. [name], [name] ([id number]) 06/28/2015 -- she is here for review and after seeing a wound I have not applied Puraply. Objective Constitutional Pulse regular. Respirations normal and unlabored. Afebrile. Vitals Time Taken: 1:06 PM, Height: 64 in, Weight: 180 lbs, BMI: 30.9, Temperature: 98.2 F, Pulse: 68 bpm, Respiratory Rate: 16 breaths/min, Blood Pressure: 141/55 mmHg. Eyes Nonicteric. Reactive to light. Ears, Nose, Mouth, and Throat Lips, teeth, and gums WNL.Marland Kitchen Moist mucosa without lesions . Neck supple and nontender. No palpable supraclavicular or cervical adenopathy. Normal sized without goiter. Respiratory WNL. No retractions.. Breath sounds WNL, No rubs, rales, rhonchi, or wheeze.. Cardiovascular Heart rhythm and rate regular, no murmur or gallop.. Pedal Pulses WNL. No clubbing, cyanosis or edema. Chest Breasts symmetical and no nipple discharge.. Breast tissue WNL, no masses, lumps, or tenderness.. Lymphatic No adneopathy. No adenopathy. No adenopathy. Musculoskeletal Adexa without tenderness or enlargement.. Digits and nails w/o clubbing, cyanosis, infection, petechiae, ischemia, or inflammatory conditions.Marland Kitchen Psychiatric Judgement and insight Intact.. No evidence of  depression, anxiety, or agitation.. General Notes: This week the wound is completely epihelialized and there is no open area. Integumentary (Hair, Skin) No suspicious lesions. No crepitus or fluctuance. No peri-wound warmth or erythema. No masses.Marland Kitchen [place], [place]  ([id number]) Wound #6 status is Healed - Epithelialized. Original cause of wound was Gradually Appeared. The wound is located on the Left Achilles. The wound measures 0cm length x 0cm width x 0cm depth; 0cm^2 area and 0cm^3 volume. The wound is limited to skin breakdown. There is a small amount of serous drainage noted. The wound margin is distinct with the outline attached to the wound base. There is large (67-100%) red granulation within the wound bed. There is no necrotic tissue within the wound bed. The periwound skin appearance exhibited: Hemosiderin Staining. The periwound skin appearance did not exhibit: Callus, Crepitus, Excoriation, Fluctuance, Friable, Induration, Localized Edema, Rash, Scarring, Dry/Scaly, Maceration, Moist, Atrophie Blanche, Cyanosis, Ecchymosis, Mottled, Pallor, Rubor, Erythema. Periwound temperature was noted as No Abnormality. 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 I have recommended a foam border to be applied and protect this wound for the next week. If it remains healed we will discharge her from our services next week. Plan The patient seemed to have turned the corner and her wound this week is completely epithelialized. I have recommended a foam border to be applied and protect this wound for the next week. If it remains healed we will discharge her from our services next week. Electronic Signature(s) Signed: 06/28/2015 1:30:00 PM By: Christin Fudge MD, FACS Entered By: Christin Fudge on 06/28/2015 13:30:00 [name]  ([id number]) -------------------------------------------------------------------------------- SuperBill Details Patient Name: [name] Date of Service: 06/28/2015 Medical Record Number: [id number] Patient Account Number: [id number] Date of Birth/Sex: Aug 08, 1928 (81 y.o. 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 I70.235 Atherosclerosis of native arteries of right leg with ulceration of other part of foot Facility Procedures CPT4 Code: [id number] Description: [id number] - WOUND CARE VISIT-LEV 2 EST PT Modifier: Quantity: 1 Physician Procedures CPT4: Description Modifier Quantity Code [id number] - WC PHYS LEVEL 3 - EST PT 1 ICD-10 Description Diagnosis 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

## 2015-06-30 NOTE — Progress Notes (Signed)
[name], [name] ([id number]) Visit Report for 06/28/2015 Arrival Information Details Patient Name: [name], [name] Date of Service: 06/28/2015 1:00 PM Medical Record Number: [id number] Patient Account Number: [id number] Date of Birth/Sex: 1928-03-20 (81 y.o. Female) Treating RN: Cornell Barman Primary Care Physician: SYSTEM, PCP Other Clinician: Referring Physician: Constance Goltz Treating Physician/Extender: Frann Rider in Treatment: 37 Visit Information History Since Last Visit Added or deleted any medications: No Patient Arrived: Wheel Chair Any new allergies or adverse reactions: No Arrival Time: 13:05 Had a fall or experienced change in No Accompanied By: self activities of daily living that may affect Transfer Assistance: Manual risk of falls: Patient Identification Verified: Yes Signs or symptoms of abuse/neglect since last No Secondary Verification Process Yes visito Completed: Hospitalized since last visit: No Patient Requires Transmission- No Has Dressing in Place as Prescribed: Yes Based Precautions: Pain Present Now: No Patient Has Alerts: Yes Patient Alerts: Patient on Blood Thinner Electronic Signature(s) Signed: 06/29/2015 4:39:51 PM By: Gretta Cool, RN, BSN, Kim RN, BSN Entered By: Gretta Cool, RN, BSN, Kim on 06/28/2015 13:05:41 [name] ([id number]) -------------------------------------------------------------------------------- Clinic Level of Care Assessment Details Patient Name: [name] Date of Service: 06/28/2015 1:00 PM Medical Record Number: [id number] Patient Account Number: [id number] Date of Birth/Sex: [birth year] (81 y.o. Female) Treating RN: Cornell Barman Primary Care Physician: SYSTEM, PCP Other Clinician: Referring Physician: Constance Goltz Treating Physician/Extender: Frann Rider in Treatment: 48 Clinic Level of Care Assessment Items TOOL 4 Quantity Score []  - Use when only an EandM is performed on FOLLOW-UP visit 0 ASSESSMENTS -  Nursing Assessment / Reassessment []  - Reassessment of Co-morbidities (includes updates in patient status) 0 X - Reassessment of Adherence to Treatment Plan 1 5 ASSESSMENTS - Wound and Skin Assessment / Reassessment X - Simple Wound Assessment / Reassessment - one wound 1 5 []  - Complex Wound Assessment / Reassessment - multiple wounds 0 []  - Dermatologic / Skin Assessment (not related to wound area) 0 ASSESSMENTS - Focused Assessment []  - Circumferential Edema Measurements - multi extremities 0 []  - Nutritional Assessment / Counseling / Intervention 0 []  - Lower Extremity Assessment (monofilament, tuning fork, pulses) 0 []  - Peripheral Arterial Disease Assessment (using hand held doppler) 0 ASSESSMENTS - Ostomy and/or Continence Assessment and Care []  - Incontinence Assessment and Management 0 []  - Ostomy Care Assessment and Management (repouching, etc.) 0 PROCESS - Coordination of Care X - Simple Patient / Family Education for ongoing care 1 15 []  - Complex (extensive) Patient / Family Education for ongoing care 0 []  - Staff obtains Programmer, systems, Records, Test Results / Process Orders 0 []  - Staff telephones HHA, Nursing Homes / Clarify orders / etc 0 []  - Routine Transfer to another Facility (non-emergent condition) 0 [place], [name] ([id number]) []  - Routine Hospital Admission (non-emergent condition) 0 []  - New Admissions / Biomedical engineer / Ordering NPWT, Apligraf, etc. 0 []  - Emergency Hospital Admission (emergent condition) 0 X - Simple Discharge Coordination 1 10 []  - Complex (extensive) Discharge Coordination 0 PROCESS - Special Needs []  - Pediatric / Minor Patient Management 0 []  - Isolation Patient Management 0 []  - Hearing / Language / Visual special needs 0 []  - Assessment of Community assistance (transportation, D/C planning, etc.) 0 []  - Additional assistance / Altered mentation 0 []  - Support Surface(s) Assessment (bed, cushion, seat, etc.) 0 INTERVENTIONS -  Wound Cleansing / Measurement X - Simple Wound Cleansing - one wound 1 5 []  - Complex Wound Cleansing - multiple wounds 0 X - Wound Imaging (photographs - any  number of wounds) 1 5 []  - Wound Tracing (instead of photographs) 0 X - Simple Wound Measurement - one wound 1 5 []  - Complex Wound Measurement - multiple wounds 0 INTERVENTIONS - Wound Dressings X - Small Wound Dressing one or multiple wounds 1 10 []  - Medium Wound Dressing one or multiple wounds 0 []  - Large Wound Dressing one or multiple wounds 0 []  - Application of Medications - topical 0 []  - Application of Medications - injection 0 INTERVENTIONS - Miscellaneous []  - External ear exam 0 [name], [name] ([id number]) []  - Specimen Collection (cultures, biopsies, blood, body fluids, etc.) 0 []  - Specimen(s) / Culture(s) sent or taken to Lab for analysis 0 []  - Patient Transfer (multiple staff / Harrel Lemon Lift / Similar devices) 0 []  - Simple Staple / Suture removal (25 or less) 0 []  - Complex Staple / Suture removal (26 or more) 0 []  - Hypo / Hyperglycemic Management (close monitor of Blood Glucose) 0 []  - Ankle / Brachial Index (ABI) - do not check if billed separately 0 X - Vital Signs 1 5 Has the patient been seen at the hospital within the last three years: Yes Total Score: 65 Level Of Care: New/Established - Level 2 Electronic Signature(s) Signed: 06/29/2015 4:39:51 PM By: Gretta Cool, RN, BSN, Kim RN, BSN Entered By: Gretta Cool, RN, BSN, Kim on 06/28/2015 13:29:08 [name] ([id number]) -------------------------------------------------------------------------------- Encounter Discharge Information Details Patient Name: [name] Date of Service: 06/28/2015 1:00 PM Medical Record Number: [id number] Patient Account Number: [id number] Date of Birth/Sex: 07/04/1928 (81 y.o. Female) Treating RN: Cornell Barman Primary Care Physician: SYSTEM, PCP Other Clinician: Referring Physician: Constance Goltz Treating Physician/Extender:  Frann Rider in Treatment: 80 Encounter Discharge Information Items Discharge Pain Level: 0 Discharge Condition: Stable Ambulatory Status: Wheelchair Discharge Destination: Home Transportation: Private Auto Accompanied By: self Schedule Follow-up Appointment: Yes Medication Reconciliation completed and provided to Patient/Care Yes [name]: Provided on Clinical Summary of Care: 06/28/2015 Form Type Recipient Paper Patient DJ Electronic Signature(s) Signed: 06/29/2015 4:39:51 PM By: Gretta Cool RN, BSN, Kim RN, BSN Previous Signature: 06/28/2015 1:23:49 PM Version By: Ruthine Dose Entered By: Gretta Cool RN, BSN, Kim on 06/28/2015 13:29:42 [name] ([id number]) -------------------------------------------------------------------------------- Lower Extremity Assessment Details Patient Name: [name] Date of Service: 06/28/2015 1:00 PM Medical Record Number: [id number] Patient Account Number: [id number] Date of Birth/Sex: 1927/09/10 (81 y.o. Female) Treating RN: Cornell Barman Primary Care Physician: SYSTEM, PCP Other Clinician: Referring Physician: Constance Goltz Treating Physician/Extender: Frann Rider in Treatment: 34 Vascular Assessment Pulses: Posterior Tibial Dorsalis Pedis Palpable: [Left:Yes] Extremity colors, hair growth, and conditions: Extremity Color: [Left:Hyperpigmented] Hair Growth on Extremity: [Left:No] Temperature of Extremity: [Left:Warm] Capillary Refill: [Left:< 3 seconds] Toe Nail Assessment Left: Right: Thick: No Discolored: No Deformed: No Improper Length and Hygiene: No Electronic Signature(s) Signed: 06/29/2015 4:39:51 PM By: Gretta Cool, RN, BSN, Kim RN, BSN Entered By: Gretta Cool, RN, BSN, Kim on 06/28/2015 13:11:31 [name] ([id number]) -------------------------------------------------------------------------------- Multi Wound Chart Details Patient Name: [name] Date of Service: 06/28/2015 1:00 PM Medical Record  Number: [id number] Patient Account Number: [id number] Date of Birth/Sex: [birth year] (81 y.o. 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/A] Comorbid History: [6:Glaucoma, Asthma, Hypertension,  Peripheral Venous Disease, Type II Diabetes] [N/A:N/A] Date Acquired: [6:09/26/2014] [N/A:N/A] Weeks of Treatment: [6:38] [N/A:N/A] Wound Status: [6:Healed - Epithelialized] [N/A:N/A] Measurements L x W x D 0x0x0 [N/A:N/A] (cm) Area (cm) : [6:0] [N/A:N/A] Volume (cm) : [6:0] [N/A:N/A] % Reduction in Area: [6:100.00%] [N/A:N/A] % Reduction in Volume: 100.00% [N/A:N/A] Classification: [6:Partial Thickness] [N/A:N/A] HBO Classification: [6:Grade 1] [N/A:N/A] Exudate Amount: [6:Small] [N/A:N/A] Exudate Type: [6:Serous] [N/A:N/A] Exudate Color: [6:amber] [N/A:N/A] Wound Margin: [6:Distinct, outline attached N/A] Granulation Amount: [6:Large (67-100%)] [N/A:N/A] Granulation Quality: [6:Red] [N/A:N/A] Necrotic Amount: [6:None Present (0%)] [N/A:N/A] Exposed Structures: [6:Fascia: No Fat: No Tendon: No Muscle: No] [N/A:N/A] Joint: No Bone: No Limited to Skin Breakdown Epithelialization: Medium (34-66%) N/A N/A Periwound Skin Texture: Edema: No N/A N/A Excoriation: No Induration: No Callus: No Crepitus: No Fluctuance: No Friable: No Rash: No Scarring: No Periwound Skin Maceration: No N/A N/A Moisture: Moist: No Dry/Scaly: No Periwound Skin Color: Hemosiderin Staining: Yes N/A N/A Atrophie  Blanche: No Cyanosis: No Ecchymosis: No Erythema: No Mottled: No Pallor: No Rubor: No Temperature: No Abnormality N/A N/A Tenderness on No N/A N/A Palpation: Wound Preparation: Ulcer Cleansing: N/A N/A Rinsed/Irrigated with Saline Topical Anesthetic Applied: None Treatment Notes Electronic Signature(s) Signed: 06/29/2015 4:39:51 PM By: Gretta Cool, RN, BSN, Kim RN, BSN Entered By: Gretta Cool, RN, BSN, Kim on 06/28/2015 13:27:53 [name] ([id number]) -------------------------------------------------------------------------------- Multi-Disciplinary Care Plan Details Patient Name: [name] Date of Service: 06/28/2015 1:00 PM Medical Record Number: [id number] Patient Account Number: [id number] Date of Birth/Sex: [birth year] (81 y.o. Female) Treating RN: Cornell Barman Primary Care Physician: SYSTEM, PCP Other Clinician: Referring Physician: Constance Goltz Treating Physician/Extender: Frann Rider in Treatment: 34 Active Inactive Abuse / Safety / Falls / Self Care Management Nursing Diagnoses: Impaired physical mobility Potential for falls Goals: Patient will remain injury free Date Initiated: 09/12/2014 Goal Status: Active Patient/caregiver will verbalize understanding of skin care regimen Date Initiated: 09/12/2014 Goal Status: Active Patient/caregiver will verbalize/demonstrate measures taken to prevent injury and/or falls Date Initiated: 09/12/2014 Goal Status: Active Patient/caregiver will verbalize/demonstrate understanding of what to do in case of emergency Date Initiated: 09/12/2014 Goal Status: Active Interventions: Assess fall risk on admission and as needed Assess: immobility, friction, shearing, incontinence upon admission and as needed Assess impairment of mobility on admission and as needed per policy Provide education on basic hygiene Provide education on fall prevention Provide education on personal and home safety Provide education on safe  transfers Treatment Activities: Education provided on Basic Hygiene : 10/12/2014 Notes: Necrotic Tissue [name], [name] ([id number]) Nursing Diagnoses: Impaired tissue integrity related to necrotic/devitalized tissue Knowledge deficit related to management of necrotic/devitalized tissue Goals: Necrotic/devitalized tissue will be minimized in the wound bed Date Initiated: 09/12/2014 Goal Status: Active Patient/caregiver will verbalize understanding of reason and process for debridement of necrotic tissue Date Initiated: 09/12/2014 Goal Status: Active Interventions: Assess patient pain level pre-, during and post procedure and prior to discharge Provide education on necrotic tissue and debridement process Treatment Activities: Apply topical anesthetic as ordered : 06/28/2015 Enzymatic debridement : 06/28/2015 Excisional debridement : 06/28/2015 Notes: Orientation to the Wound Care Program Nursing Diagnoses: Knowledge deficit related to the wound healing center program Goals: Patient/caregiver will verbalize understanding of the [facility] Program Date Initiated: 09/12/2014 Goal Status: Active Interventions: Provide education on orientation to the wound center Notes: Pressure Nursing Diagnoses: Knowledge deficit related to causes and risk factors for pressure ulcer development Knowledge deficit related to management of pressures ulcers Potential for impaired tissue integrity related to pressure, friction, moisture, and shear Goals:  [name], [name] ([id number]) Patient will remain free from development of additional pressure ulcers Date Initiated: 09/12/2014 Goal Status: Active Patient will remain free of pressure ulcers Date Initiated: 09/12/2014 Goal Status: Active Patient/caregiver will verbalize risk factors for pressure ulcer development Date Initiated: 09/12/2014 Goal Status: Active Patient/caregiver will verbalize understanding of pressure ulcer management Date  Initiated: 09/12/2014 Goal Status: Active Interventions: Assess: immobility, friction, shearing, incontinence upon admission and as needed Assess offloading mechanisms upon admission and as needed Assess potential for pressure ulcer upon admission and as needed Provide education on pressure ulcers Treatment Activities: Pressure reduction/relief device ordered : 06/28/2015 Notes: Wound/Skin Impairment Nursing Diagnoses: Impaired tissue integrity Knowledge deficit related to ulceration/compromised skin integrity Goals: Patient/caregiver will verbalize understanding of skin care regimen Date Initiated: 09/12/2014 Goal Status: Active Ulcer/skin breakdown will heal within 14 weeks Date Initiated: 09/12/2014 Goal Status: Active Interventions: Assess patient/caregiver ability to obtain necessary supplies Assess patient/caregiver ability to perform ulcer/skin care regimen upon admission and as needed Assess ulceration(s) every visit Provide education on ulcer and skin care Treatment Activities: Skin care regimen initiated : 06/28/2015 [name], [name] ([id number]) Topical wound management initiated : 06/28/2015 Notes: Electronic Signature(s) Signed: 06/29/2015 4:39:51 PM By: Gretta Cool, RN, BSN, Kim RN, BSN Entered By: Gretta Cool, RN, BSN, Kim on 06/28/2015 13:27:46 [name] ([id number]) -------------------------------------------------------------------------------- Pain Assessment Details Patient Name: [name] Date of Service: 06/28/2015 1:00 PM Medical Record Number: [id number] Patient Account Number: [id number] Date of Birth/Sex: 1927/09/16 (81 y.o. Female) Treating RN: Cornell Barman Primary Care Physician: SYSTEM, PCP Other Clinician: Referring Physician: Constance Goltz Treating Physician/Extender: Frann Rider in Treatment: 17 Active Problems Location of Pain Severity and Description of Pain Patient Has Paino No Site Locations Pain Management and Medication Current  Pain Management: Electronic Signature(s) Signed: 06/29/2015 4:39:51 PM By: Gretta Cool, RN, BSN, Kim RN, BSN Entered By: Gretta Cool, RN, BSN, Kim on 06/28/2015 13:06:22 [name] ([id number]) -------------------------------------------------------------------------------- Patient/Caregiver Education Details Patient Name: [name] Date of Service: 06/28/2015 1:00 PM Medical Record Number: [id number] Patient Account Number: [id number] Date of Birth/Gender: 1928-02-08 (81 y.o. Female) Treating RN: Cornell Barman Primary Care Physician: SYSTEM, PCP Other Clinician: Referring Physician: Constance Goltz Treating Physician/Extender: Frann Rider in Treatment: 35 Education Assessment Education Provided To: Patient Education Topics Provided Wound/Skin Impairment: Handouts: Caring for Your Ulcer, Other: leave dressing in place Methods: Demonstration Responses: State content correctly Electronic Signature(s) Signed: 06/29/2015 4:39:51 PM By: Gretta Cool, RN, BSN, Kim RN, BSN Entered By: Gretta Cool, RN, BSN, Kim on 06/28/2015 13:29:59 [name] ([id number]) -------------------------------------------------------------------------------- Wound Assessment Details Patient Name: [name] Date of Service: 06/28/2015 1:00 PM Medical Record Number: [id number] Patient Account Number: [id number] Date of Birth/Sex: [birth year] (81 y.o. Female) Treating RN: Cornell Barman Primary Care Physician: SYSTEM, PCP Other Clinician: Referring Physician: Constance Goltz Treating Physician/Extender: Frann Rider in Treatment: 41 Wound Status Wound Number: 6 Primary Arterial Insufficiency Ulcer Etiology: Wound Location: Left Achilles Wound Healed - Epithelialized Wounding Event: Gradually Appeared Status: Date Acquired: 09/26/2014 Comorbid Glaucoma, Asthma, Hypertension, Weeks Of Treatment: 38 History: Peripheral Venous Disease, Type II Clustered Wound: No Diabetes Photos Photo Uploaded By: Gretta Cool,  RN, BSN, Kim on 06/28/2015 15:20:05 Wound Measurements Length: (cm) 0 % Redu Width: (cm) 0 % Redu Depth: (cm) 0 Epithe Area: (cm) 0 Volume: (cm) 0 ction in Area: 100% ction in Volume: 100% lialization: Medium (34-66%) Wound Description Classification: Partial Thickness Foul O Diabetic Severity (Wagner): Grade 1 Wound Margin: Distinct, outline attached Exudate Amount: Small Exudate Type: Serous Exudate Color: amber dor After Cleansing: No Wound Bed  Granulation Amount: Large (67-100%) Exposed Structure Granulation Quality: Red Fascia Exposed: No Necrotic Amount: None Present (0%) Fat Layer Exposed: No [place], [place] ([id number]) Tendon Exposed: No Muscle Exposed: No Joint Exposed: No Bone Exposed: No Limited to Skin Breakdown Periwound Skin Texture Texture Color No Abnormalities Noted: No No Abnormalities Noted: No Callus: No Atrophie Blanche: No Crepitus: No Cyanosis: No Excoriation: No Ecchymosis: No Fluctuance: No Erythema: No Friable: No Hemosiderin Staining: Yes Induration: No Mottled: No Localized Edema: No Pallor: No Rash: No Rubor: No Scarring: No Temperature / Pain Moisture Temperature: No Abnormality No Abnormalities Noted: No Dry / Scaly: No Maceration: No Moist: No Wound Preparation Ulcer Cleansing: Rinsed/Irrigated with Saline Topical Anesthetic Applied: None Electronic Signature(s) Signed: 06/29/2015 4:39:51 PM By: Gretta Cool, RN, BSN, Kim RN, BSN Entered By: Gretta Cool, RN, BSN, Kim on 06/28/2015 13:18:30 [name] ([id number]) -------------------------------------------------------------------------------- [place] Details Patient Name: [name] Date of Service: 06/28/2015 1:00 PM Medical Record Number: [id number] Patient Account Number: [id number] Date of Birth/Sex: 03-01-1928 (81 y.o. Female) Treating RN: Cornell Barman Primary Care Physician: SYSTEM, PCP Other Clinician: Referring Physician: Constance Goltz Treating  Physician/Extender: Frann Rider in Treatment: 81 Vital Signs Time Taken: 13:06 Temperature (F): 98.2 Height (in): 64 Pulse (bpm): 68 Weight (lbs): 180 Respiratory Rate (breaths/min): 16 Body Mass Index (BMI): 30.9 Blood Pressure (mmHg): 141/55 Reference Range: 80 - 120 mg / dl Electronic Signature(s) Signed: 06/29/2015 4:39:51 PM By: Gretta Cool, RN, BSN, Kim RN, BSN Entered By: Gretta Cool, RN, BSN, Kim on 06/28/2015 13:06:40

## 2015-07-05 ENCOUNTER — Encounter: Payer: Medicare Other | Admitting: Surgery

## 2015-07-05 DIAGNOSIS — L97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: Secondary | ICD-10-CM | POA: Diagnosis not present

## 2015-07-06 NOTE — Progress Notes (Addendum)
ZIYANA, SASS ([id number]) Visit Report for 07/05/2015 Chief Complaint Document Details Patient Name: [name], [name] Date of Service: 07/05/2015 9:15 AM Medical Record Number: [id number] Patient Account Number: [id number] Date of Birth/Sex: May 23, 1928 (81 y.o. Female) Treating RN: Montey Hora Primary Care Physician: SYSTEM, PCP Other Clinician: Referring Physician: Constance Goltz Treating Physician/Extender: Frann Rider in Treatment: 30 Information Obtained from: Patient Chief Complaint Patient presents to the wound care center today with an open arterial ulcer on the left foot big toe and lateral heal. She also has some superficial ulcerations on the second and third toe dorsum. she has no family member with her today and she is a very poor historian and cannot give a proper history. As stated that she walks around with a walker. Electronic Signature(s) Signed: 07/05/2015 9:53:02 AM By: Christin Fudge MD, FACS Entered By: Christin Fudge on 07/05/2015 [place], [place] ([id number]) -------------------------------------------------------------------------------- HPI Details Patient Name: [name] Date of Service: 07/05/2015 9:15 AM Medical Record Number: [id number] Patient Account Number: [id number] Date of Birth/Sex: 07/04/1928 (81 y.o. Female) Treating RN: Montey Hora Primary Care Physician: SYSTEM, PCP Other Clinician: Referring Physician: Constance Goltz Treating Physician/Extender: Frann Rider in Treatment: 63 History of Present Illness Location: left heel Quality: Patient reports experiencing a dull pain to affected area(s). Severity: Patient states wound are getting worse. Duration: Patient states that they are not certain how long the wound has been present. Timing: Pain in wound is Intermittent (comes and goes Context: The wound appeared gradually over time Associated Signs and Symptoms: Patient reports having difficulty standing for long  periods. HPI Description: This 81 year old patient is a poor historian and comes from a nursing home. Does not have any family members with her. Says she has a ulcer on the lateral part of her left foot and some new ones are there on her right foot too. She is unable to say how long she's had these. she does walk with a walker and this is limited most of the day. Does not recall if she has had any vascular studies done. I have reviewed an x-ray done of the left foot which basically shows osteoporosis but no evidence of fracture dislocation or osteomyelitis. 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 She seems to be doing better overall. 10/19/14 -- I understand she has gone to the vascular surgery and lab yesterday and has had all her tests done yesterday. She is doing fine otherwise and has had no fresh issues. 10/26/2014 she seems to be doing well and has no fresh problems and seems much brighter. 10/12/14 -- Taking her antibiotic and continues to do her dressings locally and she is helped by her skilled nursing. She seems to be doing fine and has no fresh issues. 10/19/14 -- she is doing fine and has had no fresh issues. She says her son to go for her vascular workup yesterday and she has been called back after 3 months. we will try and gather these reports to review what they said. 10/26/14 --I have been able to review notes from the vascular surgery office and these were dated from 10/18/2014. He patient was on her first postop visit and had Doppler ultrasounds done of her arteries. She had more than 50% stenosis of the right superficial femoral artery and more than 50% stenosis in the left tibioperoneal trunk. She  was status post a left lower extremity angiogram with angioplasty of the peroneal and left superficial femoral  arteries for a nonhealing left foot and ankle ulceration. note is made of the fact that the ABIs had not changed in the last month since her previous visit. The opinion of her provider was that she did not need any intervention at this time and they had done everything they could at the present time. They would continue on Plavix and antibiotics and see her back in 3 months for a another arterial duplex study of the lower extremities. [name], [name] ([id number]) 11/02/2014 -- she has spoken to her caregivers about coming for HBO 5 times per week for 6-8 weeks and they are agreeable about this and she is willing to undergo hyperbaric oxygen therapy. 11/16/2014 she has been worked up with a EKG and chest x-ray and these were within normal limits. As far as the investigations go she is ready for HBOT. we are awaiting some insurance clearance so that she can start on her hyperbaric oxygen therapy. 11/30/2014 she tolerated hyperbaric oxygen therapy fine and there were no issues with her blood glucose levels. As noted she is not a diabetic and most of her fingerstick blood glucose levels are inaccurate because of the Raynaud's phenomena. She normally has to have your lobe sticks to get any random glucose levels. I have also requested her primary care does a venous blood glucose draw to check her glucose level. 12/21/2014 -- she is tolerating hyperbaric oxygen very well and has overall made a lot of progress. Her left leg is looking much better. 01/11/2015 - No new complaints today. New ulceration on the dorsum of the right second toe noted today on physical exam. Patient unaware. No significant pain. No fever or chills. No significant drainage. Offloading with sage boots at night. Significantly improved with HBO. 01/19/2015 - the patient has had a x-ray of the right foot done at the nursing home but we still do not have the report and we are awaiting this result. She is otherwise doing fine with her  general health. 01/25/2015 -- X-ray of her right foot showed no acute fracture, dislocation, bony infection or osteomyelitis. The was some soft tissue swelling and osteopenia seen. 02/15/2015 -- due to some insurance issues we could not get her epifix and we are working with her company to get her this. She is doing fine otherwise and has no fresh issues. 04/04/2015 -- she had a follow-up with the vascular surgeon and he seemed to be pleased with her progress and has had no problems with her vascularity. 05/04/2015 -- the wound on the left posterior ankle had stalled and after talking to the [employer] we have obtained her samples of PuraPly and we will use this on a weekly basis. 05/10/2015 - .here for the second application of puraply. 05/17/2015 -- she is here for her third application of puraply [id number] ---here for the fifth application of puraply [id number] -- last week she had her sixth application of Puraply and this week will be the seventh application of Puraply. 06/22/2015 -- she has no fresh issues and this week will be the eighth application of Puraply. 06/28/2015 -- she is here for review and after seeing a wound I have not applied Puraply. Electronic Signature(s) Signed: 07/05/2015 9:53:14 AM By: Christin Fudge MD, FACS Entered By: Christin Fudge on 07/05/2015 09:53:14 [name] ([id number]) -------------------------------------------------------------------------------- Physical Exam Details Patient Name: [name] Date of Service: 07/05/2015 9:15 AM Medical Record Number:  [id number] Patient Account Number: [id number] Date of Birth/Sex: [birth year] (81 y.o. Female) Treating RN: Montey Hora Primary Care Physician: SYSTEM, PCP Other Clinician: Referring Physician: Constance Goltz Treating Physician/Extender: Frann Rider in Treatment: 42 Constitutional . Pulse regular. Respirations normal and unlabored. Afebrile. . Eyes Nonicteric. Reactive  to light. Ears, Nose, Mouth, and Throat Lips, teeth, and gums WNL.Marland Kitchen Moist mucosa without lesions . Neck supple and nontender. No palpable supraclavicular or cervical adenopathy. Normal sized without goiter. Respiratory WNL. No retractions.. Breath sounds WNL, No rubs, rales, rhonchi, or wheeze.. Cardiovascular Heart rhythm and rate regular, no murmur or gallop.. Pedal Pulses WNL. No clubbing, cyanosis or edema. Chest Breasts symmetical and no nipple discharge.. Breast tissue WNL, no masses, lumps, or tenderness.. Lymphatic No adneopathy. No adenopathy. No adenopathy. Musculoskeletal Adexa without tenderness or enlargement.. Digits and nails w/o clubbing, cyanosis, infection, petechiae, ischemia, or inflammatory conditions.. Integumentary (Hair, Skin) No suspicious lesions. No crepitus or fluctuance. No peri-wound warmth or erythema. No masses.Marland Kitchen Psychiatric Judgement and insight Intact.. No evidence of depression, anxiety, or agitation.. Notes the wound is healed and there are no open areas. Surrounding skin is fairly dry and needs a moisturizer. Electronic Signature(s) Signed: 07/05/2015 9:53:41 AM By: Christin Fudge MD, FACS Entered By: Christin Fudge on 07/05/2015 09:53:40 [name] ([id number]) -------------------------------------------------------------------------------- Physician Orders Details Patient Name: [name] Date of Service: 07/05/2015 9:15 AM Medical Record Number: [id number] Patient Account Number: [id number] Date of Birth/Sex: 11/04/1927 (81 y.o. 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 I70.235 Atherosclerosis of native arteries of right leg with ulceration of other part of foot Discharge From [facility] o Discharge from [place] Signature(s) Signed: 07/05/2015 12:54:46 PM By: Montey Hora Signed: 07/05/2015 4:05:55 PM By: Christin Fudge MD, FACS Entered By: Montey Hora on 07/05/2015 12:54:46 [name] ([id number]) -------------------------------------------------------------------------------- Problem List Details Patient Name: [name] Date of Service: 07/05/2015 9:15 AM Medical Record Number: [id number] Patient Account Number: [id number] Date of Birth/Sex: Nov 06, 1927 (81 y.o. Female) Treating RN: Montey Hora Primary Care Physician: SYSTEM, PCP Other Clinician: Referring Physician: Constance Goltz Treating Physician/Extender: Frann Rider in Treatment: 43 Active Problems ICD-10 Encounter Code Description Active Date Diagnosis L97.422 Non-pressure chronic ulcer of left heel and midfoot with fat 09/12/2014 Yes layer exposed 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 09/12/2014 Yes of heel and midfoot I70.235 Atherosclerosis of native arteries of right leg with 09/12/2014 Yes ulceration of other part of foot Inactive Problems Resolved Problems ICD-10 Code Description Active Date Resolved Date L97.412 Non-pressure chronic ulcer of right heel and midfoot with 10/26/2014 10/26/2014 fat layer exposed S91.104A Unspecified open wound of right lesser toe(s) without 01/11/2015 01/11/2015 damage to nail, initial encounter Electronic Signature(s) Signed: 07/05/2015 9:52:56 AM By: Christin Fudge MD, FACS Entered By: Christin Fudge on 07/05/2015 09:52:55 [name] ([id number]) -------------------------------------------------------------------------------- Progress Note Details Patient Name: [name] Date of Service: 07/05/2015 9:15 AM Medical Record Number: [id number] Patient Account Number: [id number] Date of Birth/Sex: 19-Sep-1927 (81 y.o. Female) Treating RN: Montey Hora Primary Care Physician: SYSTEM, PCP Other Clinician: Referring Physician: Constance Goltz Treating Physician/Extender: Frann Rider in Treatment: 43 Subjective Chief  Complaint Information obtained from Patient Patient presents to the wound care center today with an open arterial ulcer on the left foot big toe and lateral heal. She also has some superficial ulcerations on the second and third toe dorsum. she has no family member with her today and she is a very poor historian and cannot give a proper history. As stated that she walks around with a walker. History of Present Illness (HPI) The following HPI elements were documented for the patient's wound: Location: left heel Quality: Patient reports experiencing a dull pain to affected area(s). Severity: Patient states wound are getting worse. Duration: Patient states that they are not certain how long the wound has been present. Timing: Pain in wound is Intermittent (comes and goes Context: The wound appeared gradually over time Associated Signs and Symptoms: Patient reports having difficulty standing for long periods. This 81 year old patient is a poor historian and comes from a nursing home. Does not have any family members with her. Says she has a ulcer on the lateral part of her left foot and some new ones are there on her right foot too. She is unable to say how long she's had these. she does walk with a walker and this is limited most of the day. Does not recall if she has had any vascular studies done. I have reviewed an x-ray done of the left foot which basically shows osteoporosis but no evidence of fracture dislocation or osteomyelitis. 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 She seems to be doing better overall. 10/19/14 -- I understand she has gone to the vascular surgery and lab yesterday and has had all her tests done yesterday. She is doing fine otherwise and has had no fresh issues. 10/26/2014 she seems to be doing well and has no fresh problems and seems much brighter. 10/12/14 -- Taking her antibiotic and continues to do her dressings locally and she is helped by her skilled nursing. She seems to be doing fine and has no fresh issues. 10/19/14 -- she is doing fine and has had no fresh issues. She says her son to go for her vascular workup yesterday and she has been called back after 3 months. we will try and gather these reports to review what [name], [name] ([id number]) they said. 10/26/14 --I have been able to review notes from the vascular surgery office and these were dated from 10/18/2014. He patient was on her first postop visit and had Doppler ultrasounds done of her arteries. She had more than 50% stenosis of the right superficial femoral artery and more than 50% stenosis in the left tibioperoneal trunk. She was status post a left lower extremity angiogram with angioplasty of the peroneal and left superficial femoral arteries for a nonhealing left foot and ankle ulceration. note is made of the fact that the ABIs had not changed in the last month since her previous visit. The opinion of her provider was that she did not need any intervention at this time and they had done everything they could at the present time. They would continue on Plavix and antibiotics and see her back in 3 months for a another arterial duplex study of the lower extremities. 11/02/2014 -- she has spoken to her caregivers about coming for HBO 5 times per week for 6-8 weeks and they are agreeable  about this and she is willing to undergo hyperbaric oxygen therapy. 11/16/2014  she has been worked up with a EKG and chest x-ray and these were within normal limits. As far as the investigations go she is ready for HBOT. we are awaiting some insurance clearance so that she can start on her hyperbaric oxygen therapy. 11/30/2014 she tolerated hyperbaric oxygen therapy fine and there were no issues with her blood glucose levels. As noted she is not a diabetic and most of her fingerstick blood glucose levels are inaccurate because of the Raynaud's phenomena. She normally has to have your lobe sticks to get any random glucose levels. I have also requested her primary care does a venous blood glucose draw to check her glucose level. 12/21/2014 -- she is tolerating hyperbaric oxygen very well and has overall made a lot of progress. Her left leg is looking much better. 01/11/2015 - No new complaints today. New ulceration on the dorsum of the right second toe noted today on physical exam. Patient unaware. No significant pain. No fever or chills. No significant drainage. Offloading with sage boots at night. Significantly improved with HBO. 01/19/2015 - the patient has had a x-ray of the right foot done at the nursing home but we still do not have the report and we are awaiting this result. She is otherwise doing fine with her general health. 01/25/2015 -- X-ray of her right foot showed no acute fracture, dislocation, bony infection or osteomyelitis. The was some soft tissue swelling and osteopenia seen. 02/15/2015 -- due to some insurance issues we could not get her epifix and we are working with her company to get her this. She is doing fine otherwise and has no fresh issues. 04/04/2015 -- she had a follow-up with the vascular surgeon and he seemed to be pleased with her progress and has had no problems with her vascularity. 05/04/2015 -- the wound on the left posterior ankle had stalled and after talking to the [employer] we have obtained her samples of PuraPly and we  will use this on a weekly basis. 05/10/2015 - .here for the second application of puraply. 05/17/2015 -- she is here for her third application of puraply [id number] ---here for the fifth application of puraply [id number] -- last week she had her sixth application of Puraply and this week will be the seventh application of Puraply. 06/22/2015 -- she has no fresh issues and this week will be the eighth application of Puraply. [name], [name] ([id number]) 06/28/2015 -- she is here for review and after seeing a wound I have not applied Puraply. Objective Constitutional Pulse regular. Respirations normal and unlabored. Afebrile. Vitals Time Taken: 9:31 AM, Height: 64 in, Weight: 180 lbs, BMI: 30.9, Temperature: 97.7 F, Pulse: 70 bpm, Respiratory Rate: 16 breaths/min, Blood Pressure: 147/54 mmHg. Eyes Nonicteric. Reactive to light. Ears, Nose, Mouth, and Throat Lips, teeth, and gums WNL.Marland Kitchen Moist mucosa without lesions . Neck supple and nontender. No palpable supraclavicular or cervical adenopathy. Normal sized without goiter. Respiratory WNL. No retractions.. Breath sounds WNL, No rubs, rales, rhonchi, or wheeze.. Cardiovascular Heart rhythm and rate regular, no murmur or gallop.. Pedal Pulses WNL. No clubbing, cyanosis or edema. Chest Breasts symmetical and no nipple discharge.. Breast tissue WNL, no masses, lumps, or tenderness.. Lymphatic No adneopathy. No adenopathy. No adenopathy. Musculoskeletal Adexa without tenderness or enlargement.. Digits and nails w/o clubbing, cyanosis, infection, petechiae, ischemia, or inflammatory conditions.Marland Kitchen Psychiatric Judgement and insight Intact.. No evidence of depression, anxiety, or agitation.. General Notes: the wound is  healed and there are no open areas. Surrounding skin is fairly dry and needs a moisturizer. Integumentary (Hair, Skin) [place], [name] ([id number]) No suspicious lesions. No crepitus or fluctuance. No peri-wound warmth or  erythema. No masses.. 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 She thoroughly understands the treatment plan. She is discharged from the wound care services and will be seen back as needed. Plan Discharge From [facility]: Discharge from [place] After 8 applications of Puraply the patient's wound is completely healed and we have spent some time discussing offloading and the methods to be used. She thoroughly understands the treatment plan. She is discharged from the wound care services and will be seen back as needed. Electronic Signature(s) Signed: 07/05/2015 4:04:35 PM By: Christin Fudge MD, FACS Previous Signature: 07/05/2015 4:04:23 PM Version By: Christin Fudge MD, FACS Previous Signature: 07/05/2015 9:54:50 AM Version By: Christin Fudge MD, FACS Entered By: Christin Fudge on 07/05/2015 16:04:35 [name] ([id number]) -------------------------------------------------------------------------------- SuperBill Details Patient Name: [name] Date of Service: 07/05/2015 Medical Record Number: [id number] Patient Account Number: [id number] Date of Birth/Sex: Jun 14, 1928 (81 y.o. Female) Treating RN: Montey Hora Primary Care Physician: SYSTEM, PCP Other Clinician: Referring Physician: Constance Goltz Treating Physician/Extender: Frann Rider in Treatment: 42 Diagnosis Coding ICD-10 Codes Code Description [phone] Non-pressure chronic ulcer of left heel and midfoot with fat layer exposed I70.244 Atherosclerosis of native arteries of left leg with  ulceration of heel and midfoot I70.235 Atherosclerosis of native arteries of right leg with ulceration of other part of foot Facility Procedures CPT4 Code: [id number] Description: [id number] - WOUND CARE VISIT-LEV 2 EST PT Modifier: Quantity: 1 Physician Procedures CPT4: Description Modifier Quantity Code [id number] - WC PHYS LEVEL 3 - EST PT 1 ICD-10 Description Diagnosis 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 of native arteries of right leg with ulceration of other part of foot Electronic Signature(s) Signed: 07/05/2015 12:55:32 PM By: Montey Hora Signed: 07/05/2015 4:05:55 PM By: Christin Fudge MD, FACS Previous Signature: 07/05/2015 9:55:08 AM Version By: Christin Fudge MD, FACS Entered By: Montey Hora on 07/05/2015 12:55:32

## 2015-07-06 NOTE — Progress Notes (Signed)
[name], [name] ([id number]) Visit Report for 07/05/2015 Arrival Information Details Patient Name: [name], [name] Date of Service: 07/05/2015 9:15 AM Medical Record Number: [id number] Patient Account Number: [id number] Date of Birth/Sex: 12-Oct-1927 (81 y.o. Female) Treating RN: Montey Hora Primary Care Physician: SYSTEM, PCP Other Clinician: Referring Physician: Constance Goltz Treating Physician/Extender: Frann Rider in Treatment: 68 Visit Information History Since Last Visit Added or deleted any medications: No Patient Arrived: Walker Any new allergies or adverse reactions: No Arrival Time: 09:29 Had a fall or experienced change in No Accompanied By: self activities of daily living that may affect Transfer Assistance: None risk of falls: Patient Identification Verified: Yes Signs or symptoms of abuse/neglect since last No Secondary Verification Process Yes visito Completed: Hospitalized since last visit: No Patient Requires Transmission- No Pain Present Now: No Based Precautions: Patient Has Alerts: Yes Patient Alerts: Patient on Blood Thinner Electronic Signature(s) Signed: 07/05/2015 5:15:54 PM By: Montey Hora Entered By: Montey Hora on 07/05/2015 09:31:21 [name] ([id number]) -------------------------------------------------------------------------------- Clinic Level of Care Assessment Details Patient Name: [name] Date of Service: 07/05/2015 9:15 AM Medical Record Number: [id number] Patient Account Number: [id number] Date of Birth/Sex: 1928-03-04 (81 y.o. Female) Treating RN: Montey Hora Primary Care Physician: SYSTEM, PCP Other Clinician: Referring Physician: Constance Goltz Treating Physician/Extender: Frann Rider in Treatment: 60 Clinic Level of Care Assessment Items TOOL 4 Quantity Score []  - Use when only an EandM is performed on FOLLOW-UP visit 0 ASSESSMENTS - Nursing Assessment / Reassessment X - Reassessment of  Co-morbidities (includes updates in patient status) 1 10 X - Reassessment of Adherence to Treatment Plan 1 5 ASSESSMENTS - Wound and Skin Assessment / Reassessment []  - Simple Wound Assessment / Reassessment - one wound 0 []  - Complex Wound Assessment / Reassessment - multiple wounds 0 []  - Dermatologic / Skin Assessment (not related to wound area) 0 ASSESSMENTS - Focused Assessment []  - Circumferential Edema Measurements - multi extremities 0 []  - Nutritional Assessment / Counseling / Intervention 0 X - Lower Extremity Assessment (monofilament, tuning fork, pulses) 1 5 []  - Peripheral Arterial Disease Assessment (using hand held doppler) 0 ASSESSMENTS - Ostomy and/or Continence Assessment and Care []  - Incontinence Assessment and Management 0 []  - Ostomy Care Assessment and Management (repouching, etc.) 0 PROCESS - Coordination of Care X - Simple Patient / Family Education for ongoing care 1 15 []  - Complex (extensive) Patient / Family Education for ongoing care 0 []  - Staff obtains Programmer, systems, Records, Test Results / Process Orders 0 []  - Staff telephones HHA, Nursing Homes / Clarify orders / etc 0 []  - Routine Transfer to another Facility (non-emergent condition) 0 [place], [name] ([id number]) []  - Routine Hospital Admission (non-emergent condition) 0 []  - New Admissions / Biomedical engineer / Ordering NPWT, Apligraf, etc. 0 []  - Emergency Hospital Admission (emergent condition) 0 X - Simple Discharge Coordination 1 10 []  - Complex (extensive) Discharge Coordination 0 PROCESS - Special Needs []  - Pediatric / Minor Patient Management 0 []  - Isolation Patient Management 0 []  - Hearing / Language / Visual special needs 0 []  - Assessment of Community assistance (transportation, D/C planning, etc.) 0 []  - Additional assistance / Altered mentation 0 []  - Support Surface(s) Assessment (bed, cushion, seat, etc.) 0 INTERVENTIONS - Wound Cleansing / Measurement []  - Simple Wound  Cleansing - one wound 0 []  - Complex Wound Cleansing - multiple wounds 0 []  - Wound Imaging (photographs - any number of wounds) 0 []  - Wound Tracing (instead of photographs) 0 []  -  Simple Wound Measurement - one wound 0 []  - Complex Wound Measurement - multiple wounds 0 INTERVENTIONS - Wound Dressings []  - Small Wound Dressing one or multiple wounds 0 []  - Medium Wound Dressing one or multiple wounds 0 []  - Large Wound Dressing one or multiple wounds 0 []  - Application of Medications - topical 0 []  - Application of Medications - injection 0 INTERVENTIONS - Miscellaneous []  - External ear exam 0 [name] ([id number]) []  - Specimen Collection (cultures, biopsies, blood, body fluids, etc.) 0 []  - Specimen(s) / Culture(s) sent or taken to Lab for analysis 0 []  - Patient Transfer (multiple staff / Harrel Lemon Lift / Similar devices) 0 []  - Simple Staple / Suture removal (25 or less) 0 []  - Complex Staple / Suture removal (26 or more) 0 []  - Hypo / Hyperglycemic Management (close monitor of Blood Glucose) 0 []  - Ankle / Brachial Index (ABI) - do not check if billed separately 0 X - Vital Signs 1 5 Has the patient been seen at the hospital within the last three years: Yes Total Score: 50 Level Of Care: New/Established - Level 2 Electronic Signature(s) Signed: 07/05/2015 12:55:16 PM By: Montey Hora Entered By: Montey Hora on 07/05/2015 12:55:15 [name] ([id number]) -------------------------------------------------------------------------------- Encounter Discharge Information Details Patient Name: [name] Date of Service: 07/05/2015 9:15 AM Medical Record Number: [id number] Patient Account Number: [id number] Date of Birth/Sex: 1928/03/06 (81 y.o. Female) Treating RN: Montey Hora Primary Care Physician: SYSTEM, PCP Other Clinician: Referring Physician: Constance Goltz Treating Physician/Extender: Frann Rider in Treatment: 72 Encounter Discharge Information  Items Discharge Pain Level: 0 Discharge Condition: Stable Ambulatory Status: Walker Discharge Destination: Home Transportation: Private Auto Accompanied By: self Schedule Follow-up Appointment: No Medication Reconciliation completed and provided to Patient/Care No Darian Cansler: Provided on Clinical Summary of Care: 07/05/2015 Form Type Recipient Paper Patient DJ Electronic Signature(s) Signed: 07/05/2015 12:56:08 PM By: Montey Hora Previous Signature: 07/05/2015 9:51:41 AM Version By: Ruthine Dose Entered By: Montey Hora on 07/05/2015 12:56:08 [name] ([id number]) -------------------------------------------------------------------------------- Lower Extremity Assessment Details Patient Name: [name] Date of Service: 07/05/2015 9:15 AM Medical Record Number: [id number] Patient Account Number: [id number] Date of Birth/Sex: 11/28/1927 (81 y.o. Female) Treating RN: Montey Hora Primary Care Physician: SYSTEM, PCP Other Clinician: Referring Physician: Constance Goltz Treating Physician/Extender: Frann Rider in Treatment: 42 Vascular Assessment Pulses: Posterior Tibial Dorsalis Pedis Palpable: [Left:Yes] Extremity colors, hair growth, and conditions: Extremity Color: [Left:Hyperpigmented] Hair Growth on Extremity: [Left:No] Temperature of Extremity: [Left:Warm] Capillary Refill: [Left:< 3 seconds] Electronic Signature(s) Signed: 07/05/2015 5:15:54 PM By: Montey Hora Entered By: Montey Hora on 07/05/2015 09:36:38 [name] ([id number]) -------------------------------------------------------------------------------- Multi-Disciplinary Care Plan Details Patient Name: [name] Date of Service: 07/05/2015 9:15 AM Medical Record Number: [id number] Patient Account Number: [id number] Date of Birth/Sex: 1928/03/05 (80 y.o. Female) Treating RN: Montey Hora Primary Care Physician: SYSTEM, PCP Other Clinician: Referring Physician: Constance Goltz Treating Physician/Extender: Frann Rider in Treatment: 19 Active Inactive Electronic Signature(s) Signed: 07/05/2015 12:54:23 PM By: Montey Hora Entered By: Montey Hora on 07/05/2015 12:54:22 [name] ([id number]) -------------------------------------------------------------------------------- Patient/Caregiver Education Details Patient Name: [name] Date of Service: 07/05/2015 9:15 AM Medical Record Number: [id number] Patient Account Number: [id number] Date of Birth/Gender: November 21, 1927 (81 y.o. Female) Treating RN: Montey Hora Primary Care Physician: SYSTEM, PCP Other Clinician: Referring Physician: Constance Goltz Treating Physician/Extender: Frann Rider in Treatment: 40 Education Assessment Education Provided To: Patient Education Topics Provided Basic Hygiene: Handouts: Other: skin care of newly healed ulcer site Methods: Explain/Verbal Responses: State content  correctly Electronic Signature(s) Signed: 07/05/2015 12:56:36 PM By: Montey Hora Entered By: Montey Hora on 07/05/2015 12:56:36 [name] ([id number]) -------------------------------------------------------------------------------- Vitals Details Patient Name: [name] Date of Service: 07/05/2015 9:15 AM Medical Record Number: [id number] Patient Account Number: [id number] Date of Birth/Sex: [birth year] (81 y.o. Female) Treating RN: Montey Hora Primary Care Physician: SYSTEM, PCP Other Clinician: Referring Physician: Constance Goltz Treating Physician/Extender: Frann Rider in Treatment: 42 Vital Signs Time Taken: 09:31 Temperature (F): 97.7 Height (in): 64 Pulse (bpm): 70 Weight (lbs): 180 Respiratory Rate (breaths/min): 16 Body Mass Index (BMI): 30.9 Blood Pressure (mmHg): 147/54 Reference Range: 80 - 120 mg / dl Electronic Signature(s) Signed: 07/05/2015 5:15:54 PM By: Montey Hora Entered By: Montey Hora on 07/05/2015 09:31:44

## 2015-07-19 ENCOUNTER — Ambulatory Visit: Payer: Medicare Other | Admitting: Surgery

## 2015-07-27 ENCOUNTER — Ambulatory Visit: Payer: Medicare Other | Admitting: Surgery

## 2015-08-02 ENCOUNTER — Ambulatory Visit: Payer: Medicare Other | Admitting: Surgery

## 2016-03-06 ENCOUNTER — Ambulatory Visit: Payer: Self-pay | Admitting: Podiatry

## 2016-06-09 ENCOUNTER — Other Ambulatory Visit: Payer: Self-pay | Admitting: Gastroenterology

## 2016-06-09 DIAGNOSIS — K745 Biliary cirrhosis, unspecified: Secondary | ICD-10-CM

## 2016-06-09 DIAGNOSIS — R1310 Dysphagia, unspecified: Secondary | ICD-10-CM

## 2016-06-11 ENCOUNTER — Ambulatory Visit: Payer: Medicare Other

## 2016-06-11 ENCOUNTER — Ambulatory Visit
Admission: RE | Admit: 2016-06-11 | Discharge: 2016-06-11 | Disposition: A | Discharge status: Home / Self Care | Payer: Medicare Other | Source: Ambulatory Visit | Attending: Gastroenterology | Admitting: Gastroenterology

## 2016-06-11 DIAGNOSIS — N281 Cyst of kidney, acquired: Secondary | ICD-10-CM | POA: Diagnosis not present

## 2016-06-11 DIAGNOSIS — R1310 Dysphagia, unspecified: Secondary | ICD-10-CM | POA: Diagnosis present

## 2016-06-11 DIAGNOSIS — K449 Diaphragmatic hernia without obstruction or gangrene: Secondary | ICD-10-CM | POA: Insufficient documentation

## 2016-06-11 DIAGNOSIS — K745 Biliary cirrhosis, unspecified: Secondary | ICD-10-CM

## 2016-06-18 ENCOUNTER — Encounter: Payer: Self-pay | Admitting: *Deleted

## 2016-06-19 ENCOUNTER — Ambulatory Visit: Payer: Medicare Other | Admitting: Anesthesiology

## 2016-06-19 ENCOUNTER — Encounter
Admission: RE | Disposition: A | Discharge status: Home / Self Care | Payer: Self-pay | Source: Ambulatory Visit | Attending: Gastroenterology

## 2016-06-19 ENCOUNTER — Encounter: Payer: Self-pay | Admitting: *Deleted

## 2016-06-19 ENCOUNTER — Ambulatory Visit
Admission: RE | Admit: 2016-06-19 | Discharge: 2016-06-19 | Disposition: A | Discharge status: Home / Self Care | Payer: Medicare Other | Source: Ambulatory Visit | Attending: Gastroenterology | Admitting: Gastroenterology

## 2016-06-19 DIAGNOSIS — G43909 Migraine, unspecified, not intractable, without status migrainosus: Secondary | ICD-10-CM | POA: Diagnosis not present

## 2016-06-19 DIAGNOSIS — K222 Esophageal obstruction: Secondary | ICD-10-CM | POA: Diagnosis not present

## 2016-06-19 DIAGNOSIS — R1310 Dysphagia, unspecified: Secondary | ICD-10-CM | POA: Insufficient documentation

## 2016-06-19 DIAGNOSIS — Z79899 Other long term (current) drug therapy: Secondary | ICD-10-CM | POA: Insufficient documentation

## 2016-06-19 DIAGNOSIS — F329 Major depressive disorder, single episode, unspecified: Secondary | ICD-10-CM | POA: Insufficient documentation

## 2016-06-19 DIAGNOSIS — M109 Gout, unspecified: Secondary | ICD-10-CM | POA: Insufficient documentation

## 2016-06-19 DIAGNOSIS — J45909 Unspecified asthma, uncomplicated: Secondary | ICD-10-CM | POA: Insufficient documentation

## 2016-06-19 DIAGNOSIS — I130 Hypertensive heart and chronic kidney disease with heart failure and stage 1 through stage 4 chronic kidney disease, or unspecified chronic kidney disease: Secondary | ICD-10-CM | POA: Insufficient documentation

## 2016-06-19 DIAGNOSIS — K449 Diaphragmatic hernia without obstruction or gangrene: Secondary | ICD-10-CM | POA: Insufficient documentation

## 2016-06-19 DIAGNOSIS — G894 Chronic pain syndrome: Secondary | ICD-10-CM | POA: Insufficient documentation

## 2016-06-19 DIAGNOSIS — N183 Chronic kidney disease, stage 3 (moderate): Secondary | ICD-10-CM | POA: Insufficient documentation

## 2016-06-19 DIAGNOSIS — K224 Dyskinesia of esophagus: Secondary | ICD-10-CM | POA: Insufficient documentation

## 2016-06-19 DIAGNOSIS — K5900 Constipation, unspecified: Secondary | ICD-10-CM | POA: Diagnosis not present

## 2016-06-19 DIAGNOSIS — D519 Vitamin B12 deficiency anemia, unspecified: Secondary | ICD-10-CM | POA: Insufficient documentation

## 2016-06-19 DIAGNOSIS — B3781 Candidal esophagitis: Secondary | ICD-10-CM | POA: Diagnosis not present

## 2016-06-19 DIAGNOSIS — I509 Heart failure, unspecified: Secondary | ICD-10-CM | POA: Diagnosis not present

## 2016-06-19 DIAGNOSIS — Z7902 Long term (current) use of antithrombotics/antiplatelets: Secondary | ICD-10-CM | POA: Insufficient documentation

## 2016-06-19 DIAGNOSIS — E559 Vitamin D deficiency, unspecified: Secondary | ICD-10-CM | POA: Diagnosis not present

## 2016-06-19 DIAGNOSIS — K210 Gastro-esophageal reflux disease with esophagitis: Secondary | ICD-10-CM | POA: Insufficient documentation

## 2016-06-19 HISTORY — DX: Unspecified osteoarthritis, unspecified site: M19.90

## 2016-06-19 HISTORY — PX: ESOPHAGOGASTRODUODENOSCOPY (EGD) WITH PROPOFOL: SHX5813

## 2016-06-19 HISTORY — DX: Emphysema, unspecified: J43.9

## 2016-06-19 HISTORY — DX: Anxiety disorder, unspecified: F41.9

## 2016-06-19 LAB — CBC
HEMATOCRIT: 37 % (ref 35.0–47.0)
Hemoglobin: 12.5 g/dL (ref 12.0–16.0)
MCH: 32.2 pg (ref 26.0–34.0)
MCHC: 33.9 g/dL (ref 32.0–36.0)
MCV: 94.9 fL (ref 80.0–100.0)
Platelets: 248 10*3/uL (ref 150–440)
RBC: 3.89 MIL/uL (ref 3.80–5.20)
RDW: 15.6 % — AB (ref 11.5–14.5)
WBC: 8.9 10*3/uL (ref 3.6–11.0)

## 2016-06-19 LAB — KOH PREP

## 2016-06-19 LAB — PROTIME-INR
INR: 1.04
Prothrombin Time: 13.6 seconds (ref 11.4–15.2)

## 2016-06-19 SURGERY — ESOPHAGOGASTRODUODENOSCOPY (EGD) WITH PROPOFOL
Anesthesia: General

## 2016-06-19 MED ORDER — SODIUM CHLORIDE 0.9 % IV SOLN
INTRAVENOUS | Status: DC
  Administered 2016-06-19 (×2): via INTRAVENOUS

## 2016-06-19 MED ORDER — FENTANYL CITRATE (PF) 100 MCG/2ML IJ SOLN
INTRAMUSCULAR | Status: DC | PRN
  Administered 2016-06-19: 25 ug via INTRAVENOUS

## 2016-06-19 MED ORDER — LIDOCAINE HCL (CARDIAC) 20 MG/ML IV SOLN
INTRAVENOUS | Status: DC | PRN
  Administered 2016-06-19: 100 mg via INTRAVENOUS

## 2016-06-19 MED ORDER — EPHEDRINE SULFATE 50 MG/ML IJ SOLN
INTRAMUSCULAR | Status: DC | PRN
  Administered 2016-06-19 (×2): 10 mg via INTRAVENOUS

## 2016-06-19 MED ORDER — PROPOFOL 500 MG/50ML IV EMUL
INTRAVENOUS | Status: DC | PRN
  Administered 2016-06-19: 140 ug/kg/min via INTRAVENOUS

## 2016-06-19 MED ORDER — SODIUM CHLORIDE 0.9 % IV SOLN: INTRAVENOUS | Status: DC

## 2016-06-19 MED ORDER — METOPROLOL TARTRATE 25 MG PO TABS
ORAL_TABLET | ORAL | Status: AC
  Filled 2016-06-19: qty 1

## 2016-06-19 MED ORDER — METOPROLOL TARTRATE 25 MG PO TABS
25.0000 mg | ORAL_TABLET | Freq: Once | ORAL | Status: AC
  Administered 2016-06-19: 25 mg via ORAL

## 2016-06-19 MED ORDER — PROPOFOL 10 MG/ML IV BOLUS
INTRAVENOUS | Status: DC | PRN
  Administered 2016-06-19: 40 mg via INTRAVENOUS

## 2016-06-19 NOTE — Op Note (Addendum)
[facility] Gastroenterology Patient Name: [name] Procedure Date: 06/19/2016 10:17 AM MRN: [id number] Account #: [id number] Date of Birth: [birth year] Admit Type: Outpatient Age: 82 Room: [facility] ENDO ROOM 4 Gender: Female Note Status: Finalized Procedure:            Upper GI endoscopy Providers:            Lollie Sails, MD Referring MD:         Rosaland Lao (Referring MD) Complications:        No immediate complications. Procedure:            Pre-Anesthesia Assessment:                       - ASA Grade Assessment: III - A patient with severe                        systemic disease.                       After obtaining informed consent, the endoscope was                        passed under direct vision. Throughout the procedure,                        the patient's blood pressure, pulse, and oxygen                        saturations were monitored continuously. The Endoscope                        was introduced through the mouth, and advanced to the                        third part of duodenum. The upper GI endoscopy was                        unusually difficult due to narrowing. Findings:      The PCF H190 was advanced to the GE junction The is a moderate to high       grade stricture that would not allow passage of the scope. Part of the       ring opened with some bleeding in attempted passage of the scope however       still would not allow passage into the stomagh. The XP 180 N scope was       then used to advance into the stomach.      Abnormal motility was noted in the esophagus. The cricopharyngeus was       normal. There is spasticity of the esophageal body. Occasional tertiary       peristaltic waves are noted, feline in appearance. Biopsies were taken       with a cold forceps for histology.      A medium-sized sliding hiatal hernia was found.      The entire examined stomach was normal.      The examined duodenum was normal.   Patchy candidiasis was found in the entire esophagus. Cells for cytology       were obtained by brushing.      LA Grade A (one or more mucosal breaks less than 5 mm, not  extending       between tops of 2 mucosal folds) esophagitis with no bleeding was found.       Biopsies were taken with a cold forceps for histology. Impression:           - Abnormal esophageal motility. Biopsied.                       - Medium-sized sliding hiatal hernia.                       - Normal stomach.                       - Normal examined duodenum. Recommendation:       - Continue present medications.                       - Repeat upper endoscopy in 3 weeks for retreatment.                       - Mycelex (clotrimazole) 10 mg lozenge 5x/day for 7                        days. Procedure Code(s):    --- Professional ---                       [phone], Esophagogastroduodenoscopy, flexible, transoral;                        with biopsy, single or multiple Diagnosis Code(s):    --- Professional ---                       K22.4, Dyskinesia of esophagus                       K44.9, Diaphragmatic hernia without obstruction or                        gangrene CPT copyright 2016 [employer]. All rights reserved. The codes documented in this report are preliminary and upon coder review may  be revised to meet current compliance requirements. Lollie Sails, MD 06/19/2016 11:02:18 AM This report has been signed electronically. Number of Addenda: 0 Note Initiated On: 06/19/2016 10:17 AM      [facility]

## 2016-06-19 NOTE — Anesthesia Procedure Notes (Signed)
Date/Time: 06/19/2016 10:20 AM Performed by: Allean Found Pre-anesthesia Checklist: Patient identified, Emergency Drugs available, Suction available, Patient being monitored and Timeout performed Patient Re-evaluated:Patient Re-evaluated prior to inductionOxygen Delivery Method: Nasal cannula Preoxygenation: Pre-oxygenation with 100% oxygen Intubation Type: IV induction

## 2016-06-19 NOTE — Anesthesia Preprocedure Evaluation (Signed)
Anesthesia Evaluation  Patient identified by MRN, date of birth, ID band Patient awake    Reviewed: Allergy & Precautions, NPO status , Patient's Chart, lab work & pertinent test results  History of Anesthesia Complications Negative for: history of anesthetic complications  Airway Mallampati: II       Dental  (+) Upper Dentures, Lower Dentures   Pulmonary COPD (pt denies),           Cardiovascular hypertension, Pt. on medications and Pt. on home beta blockers      Neuro/Psych Anxiety Depression    GI/Hepatic GERD  Medicated and Controlled,  Endo/Other  Hypothyroidism   Renal/GU Renal InsufficiencyRenal disease     Musculoskeletal  (+) Arthritis ,   Abdominal   Peds  Hematology  (+) anemia ,   Anesthesia Other Findings   Reproductive/Obstetrics                             Anesthesia Physical Anesthesia Plan  ASA: III  Anesthesia Plan: General   Post-op Pain Management:    Induction: Intravenous  Airway Management Planned: Nasal Cannula  Additional Equipment:   Intra-op Plan:   Post-operative Plan:   Informed Consent: I have reviewed the patients History and Physical, chart, labs and discussed the procedure including the risks, benefits and alternatives for the proposed anesthesia with the patient or authorized representative who has indicated his/her understanding and acceptance.     Plan Discussed with:   Anesthesia Plan Comments:         Anesthesia Quick Evaluation

## 2016-06-19 NOTE — H&P (Signed)
Outpatient short stay form Pre-procedure 06/19/2016 10:10 AM Lollie Sails MD  Primary Physician: Dr. Adrian Prows  Reason for visit:  EGD  History of present illness:  Patient is a 82 year old female presenting today as above. She has a personal history of chronic intermittent dysphagia for number of years. She has had esophageal dilatation in the past. She had a recent barium swallow showing a mildly prominent B ring and a small hiatal hernia. There was a delay of the tablet. She does take a daily proton pump inhibitor. She does also take Plavix and states she has not taken that for about 6 days. She takes no aspirin product.    Current Facility-Administered Medications:  .  0.9 %  sodium chloride infusion, , Intravenous, Continuous, Lollie Sails, MD, Last Rate: 20 mL/hr at 06/19/16 0944 .  0.9 %  sodium chloride infusion, , Intravenous, Continuous, Lollie Sails, MD .  metoprolol tartrate (LOPRESSOR) 25 MG tablet, , , ,   Prescriptions Prior to Admission  Medication Sig Dispense Refill Last Dose  . allopurinol (ZYLOPRIM) 100 MG tablet Take 100 mg by mouth daily.   06/18/2016 at 0900  . amitriptyline (ELAVIL) 25 MG tablet Take 25 mg by mouth at bedtime.   06/18/2016 at 2100  . Cholecalciferol (VITAMIN D-3) 1000 UNITS CAPS Take by mouth.   06/18/2016 at 0900  . DULoxetine (CYMBALTA) 60 MG capsule Take 60 mg by mouth daily.   06/18/2016 at 0900  . furosemide (LASIX) 20 MG tablet Take 20 mg by mouth.   06/18/2016 at 0900  . gabapentin (NEURONTIN) 600 MG tablet Take 600 mg by mouth at bedtime.   06/18/2016 at 2100  . levothyroxine (SYNTHROID, LEVOTHROID) 25 MCG tablet Take 25 mcg by mouth daily before breakfast.   06/19/2016 at 0600  . metoprolol tartrate (LOPRESSOR) 25 MG tablet Take 25 mg by mouth daily.   06/18/2016 at 0900  . montelukast (SINGULAIR) 10 MG tablet Take 10 mg by mouth daily.   06/18/2016 at 2100  . oxyCODONE (OXY IR/ROXICODONE) 5 MG immediate release tablet Take 5  mg by mouth every 4 (four) hours as needed for severe pain.     . pantoprazole (PROTONIX) 40 MG tablet Take 40 mg by mouth daily.   06/18/2016 at 0900  . potassium chloride (K-DUR,KLOR-CON) 10 MEQ tablet Take 10 mEq by mouth once.   06/18/2016 at 0900  . timolol (BETIMOL) 0.5 % ophthalmic solution Place 1 drop into both eyes daily.   06/18/2016 at Unknown time  . topiramate (TOPAMAX) 50 MG tablet Take 50 mg by mouth at bedtime.   06/18/2016 at 2100  . traMADol (ULTRAM) 50 MG tablet Take by mouth every 6 (six) hours as needed.     . ursodiol (ACTIGALL) 300 MG capsule Take 300 mg by mouth 2 (two) times daily.   06/18/2016 at 1700  . vitamin B-12 (CYANOCOBALAMIN) 1000 MCG tablet Take 1,000 mcg by mouth daily.   06/18/2016 at 0900  . clopidogrel (PLAVIX) 75 MG tablet Take 75 mg by mouth daily with breakfast.   06/14/2016  . Sennosides-Docusate Sodium (SENNA) 8.6-50 MG TABS Take by mouth.     . sodium chloride (DEEP SEA NASAL SPRAY) 0.65 % nasal spray Place 2 sprays into the nose as needed for congestion.        No Known Allergies   Past Medical History:  Diagnosis Date  . Anxiety   . Arthritis   . Asthma   . Calculus of gallbladder without  mention of cholecystitis, with obstruction   . Candidiasis of the esophagus   . Chronic kidney disease, stage III (moderate)   . Chronic pain syndrome   . Corns and callosities   . Depression   . Emphysema lung ([place])   . Esophageal reflux   . Essential hypertension, benign   . Gout, unspecified   . Hypertension   . Hypoglycemia, unspecified   . Hypopotassemia   . Migraine, unspecified, without mention of intractable migraine without mention of status migrainosus   . Other vitamin B12 deficiency anemia   . Personal history of fall   . Unspecified asthma(493.90)   . Unspecified constipation   . Unspecified hypertensive heart disease with heart failure(402.91)   . Unspecified vitamin D deficiency     Review of systems:      Physical Exam     Heart and lungs: Regular rate and rhythm without rub or gallop, lungs are bilaterally clear.    HEENT: Normocephalic atraumatic eyes are anicteric    Other:     Pertinant exam for procedure: Soft nontender nondistended bowel sounds positive normoactive.    Planned proceedures: EGD and indicated procedures. Outpatient short stay form Pre-procedure 06/19/2016 10:12 AM Lollie Sails MD

## 2016-06-19 NOTE — Transfer of Care (Signed)
Immediate Anesthesia Transfer of Care Note  Patient: [name]  Procedure(s) Performed: Procedure(s): ESOPHAGOGASTRODUODENOSCOPY (EGD) WITH PROPOFOL (N/A)  Patient Location: PACU  Anesthesia Type:General  Level of Consciousness: awake  Airway & Oxygen Therapy: Patient Spontanous Breathing and Patient connected to nasal cannula oxygen  Post-op Assessment: Report given to RN and Post -op Vital signs reviewed and stable  Post vital signs: Reviewed and stable  Last Vitals:  Vitals:   06/19/16 0829 06/19/16 1100  BP: 125/65   Pulse: 69   Resp: 16   Temp: 36.6 C 36.6 C    Last Pain:  Vitals:   06/19/16 0829  TempSrc: Tympanic         Complications: No apparent anesthesia complications

## 2016-06-20 ENCOUNTER — Encounter: Payer: Self-pay | Admitting: Gastroenterology

## 2016-06-20 LAB — SURGICAL PATHOLOGY

## 2016-06-20 NOTE — Anesthesia Postprocedure Evaluation (Signed)
Anesthesia Post Note  Patient: [name]  Procedure(s) Performed: Procedure(s) (LRB): ESOPHAGOGASTRODUODENOSCOPY (EGD) WITH PROPOFOL (N/A)  Patient location during evaluation: Endoscopy Anesthesia Type: General Level of consciousness: awake and alert Pain management: pain level controlled Vital Signs Assessment: post-procedure vital signs reviewed and stable Respiratory status: spontaneous breathing and respiratory function stable Cardiovascular status: stable Anesthetic complications: no    Last Vitals:  Vitals:   06/19/16 0829 06/19/16 1100  BP: 125/65   Pulse: 69   Resp: 16   Temp: 36.6 C 36.6 C    Last Pain:  Vitals:   06/20/16 0735  TempSrc:   PainSc: 0-No pain                 [name] K

## 2016-07-08 ENCOUNTER — Other Ambulatory Visit (INDEPENDENT_AMBULATORY_CARE_PROVIDER_SITE_OTHER): Payer: Self-pay | Admitting: Vascular Surgery

## 2016-07-08 DIAGNOSIS — I70213 Atherosclerosis of native arteries of extremities with intermittent claudication, bilateral legs: Secondary | ICD-10-CM

## 2016-07-08 DIAGNOSIS — L97409 Non-pressure chronic ulcer of unspecified heel and midfoot with unspecified severity: Secondary | ICD-10-CM

## 2016-07-09 ENCOUNTER — Ambulatory Visit (INDEPENDENT_AMBULATORY_CARE_PROVIDER_SITE_OTHER): Payer: Medicare Other | Admitting: Vascular Surgery

## 2016-07-09 ENCOUNTER — Encounter (INDEPENDENT_AMBULATORY_CARE_PROVIDER_SITE_OTHER): Payer: Self-pay | Admitting: Vascular Surgery

## 2016-07-09 ENCOUNTER — Ambulatory Visit (INDEPENDENT_AMBULATORY_CARE_PROVIDER_SITE_OTHER): Payer: Medicare Other

## 2016-07-09 VITALS — BP 147/73 | HR 69 | Resp 16

## 2016-07-09 DIAGNOSIS — I739 Peripheral vascular disease, unspecified: Secondary | ICD-10-CM | POA: Diagnosis not present

## 2016-07-09 DIAGNOSIS — I10 Essential (primary) hypertension: Secondary | ICD-10-CM | POA: Diagnosis not present

## 2016-07-09 DIAGNOSIS — L97409 Non-pressure chronic ulcer of unspecified heel and midfoot with unspecified severity: Secondary | ICD-10-CM

## 2016-07-09 DIAGNOSIS — I70219 Atherosclerosis of native arteries of extremities with intermittent claudication, unspecified extremity: Secondary | ICD-10-CM

## 2016-07-09 DIAGNOSIS — I70213 Atherosclerosis of native arteries of extremities with intermittent claudication, bilateral legs: Secondary | ICD-10-CM

## 2016-07-14 ENCOUNTER — Encounter (INDEPENDENT_AMBULATORY_CARE_PROVIDER_SITE_OTHER): Payer: Self-pay

## 2016-07-14 DIAGNOSIS — I10 Essential (primary) hypertension: Secondary | ICD-10-CM | POA: Insufficient documentation

## 2016-07-14 DIAGNOSIS — I739 Peripheral vascular disease, unspecified: Secondary | ICD-10-CM | POA: Insufficient documentation

## 2016-07-14 DIAGNOSIS — I7025 Atherosclerosis of native arteries of other extremities with ulceration: Secondary | ICD-10-CM | POA: Insufficient documentation

## 2016-07-14 NOTE — Progress Notes (Signed)
Subjective:    Patient ID: [name], female    DOB: 04/30/1928, 82 y.o.   MRN: [id number] Chief Complaint  Patient presents with  . Follow-up   Patient presents for a six month lower extremity atherosclerotic disease follow up. The patient underwent an ABI which showed Right ABI: 0.78  and Left 0.87 (on 01/07/16, Right ABI: 0.75 and Left 0.73). A bilateral lower extremity arterial duplex is notable for multiple area of stenosis with triphasic transitioning to biphasic just proximal to the popliteal then transitioning to monophasic mid-calf bilaterally. The patient denies rest pain or ulcers to her feet / toes. She has noticed an increase in her claudication symptoms, especially in her bilateral feet, right worse then left. There has been a shortening in her claudication distance. Her discomfort has now become significant enough it has started to effect her ability to function on a daily basis.    Review of Systems  Constitutional: Negative.   HENT: Negative.   Eyes: Negative.   Respiratory: Negative.   Cardiovascular:       Bilateral Lower Extremity Pain  Gastrointestinal: Negative.   Endocrine: Negative.   Genitourinary: Negative.   Musculoskeletal: Negative.   Allergic/Immunologic: Negative.   Neurological: Negative.   Hematological: Negative.   Psychiatric/Behavioral: Negative.        Objective:   Physical Exam  Constitutional: She is oriented to person, place, and time. She appears well-developed and well-nourished.  Uses walker  HENT:  Head: Normocephalic and atraumatic.  Right Ear: External ear normal.  Left Ear: External ear normal.  Eyes: Conjunctivae and EOM are normal. Pupils are equal, round, and reactive to light.  Neck: Normal range of motion.  Cardiovascular: Normal rate, regular rhythm, normal heart sounds and intact distal pulses.   Pulses:      Radial pulses are 2+ on the right side, and 2+ on the left side.       Dorsalis pedis pulses are 1+ on the  right side, and 1+ on the left side.       Posterior tibial pulses are 0 on the right side, and 0 on the left side.  Pulmonary/Chest: Effort normal and breath sounds normal.  Abdominal: Soft. Bowel sounds are normal.  Musculoskeletal: Normal range of motion. She exhibits no edema.  Neurological: She is alert and oriented to person, place, and time.  Skin: Skin is warm and dry.  Psychiatric: She has a normal mood and affect. Her behavior is normal. Judgment and thought content normal.   BP (!) 147/73   Pulse 69   Resp 16   LMP  (LMP Unknown)   Past Medical History:  Diagnosis Date  . Anxiety   . Arthritis   . Asthma   . Calculus of gallbladder without mention of cholecystitis, with obstruction   . Candidiasis of the esophagus   . Chronic kidney disease, stage III (moderate)   . Chronic pain syndrome   . Corns and callosities   . Depression   . Emphysema lung ([place])   . Esophageal reflux   . Essential hypertension, benign   . Gout, unspecified   . Hypertension   . Hypoglycemia, unspecified   . Hypopotassemia   . Migraine, unspecified, without mention of intractable migraine without mention of status migrainosus   . Other vitamin B12 deficiency anemia   . Personal history of fall   . Unspecified asthma(493.90)   . Unspecified constipation   . Unspecified hypertensive heart disease with heart failure(402.91)   . Unspecified  vitamin D deficiency    Social History   Social History  . Marital status: Single    Spouse name: N/A  . Number of children: N/A  . Years of education: N/A   Occupational History  . Not on file.   Social History Main Topics  . Smoking status: Never Smoker  . Smokeless tobacco: Never Used  . Alcohol use Not on file  . Drug use: Unknown  . Sexual activity: Not on file   Other Topics Concern  . Not on file   Social History Narrative  . No narrative on file   Past Surgical History:  Procedure Laterality Date  . ABDOMINAL HYSTERECTOMY    .  arterial thrombi stents in both legs    . BACK SURGERY    . COLONOSCOPY    . ESOPHAGOGASTRODUODENOSCOPY (EGD) WITH PROPOFOL N/A 06/19/2016   Procedure: ESOPHAGOGASTRODUODENOSCOPY (EGD) WITH PROPOFOL;  Surgeon: Lollie Sails, MD;  Location: [facility] ENDOSCOPY;  Service: Endoscopy;  Laterality: N/A;  . EYE SURGERY     cataract extraction   No family history on file.  No Known Allergies     Assessment & Plan:  Patient presents for a six month lower extremity atherosclerotic disease follow up. The patient underwent an ABI which showed Right ABI: 0.78  and Left 0.87 (on 01/07/16, Right ABI: 0.75 and Left 0.73). A bilateral lower extremity arterial duplex is notable for multiple area of stenosis with triphasic transitioning to biphasic just proximal to the popliteal then transitioning to monophasic mid-calf bilaterally. The patient denies rest pain or ulcers to her feet / toes. She has noticed an increase in her claudication symptoms, especially in her bilateral feet, right worse then left. There has been a shortening in her claudication distance. Her discomfort has now become significant enough it has started to effect her ability to function on a daily basis.   1. PVD (peripheral vascular disease) (HCC) - Worsening Stable ABI with multiple areas of stenosis bilaterally with monophasic blood flow at the ankle.  Her discomfort has now become significant enough it has started to effect her ability to function on a daily basis. Recommend a RLE angiogram in an attempt to revascularize the extremity. Procedure, risks and benefits explained to patient. All questions answered. Patient wishes to proceed. Will possibly follow with a LLE angiogram in the future.   2. Essential hypertension - Stable Encouraged good control as its slows the progression of atherosclerotic disease  3. Atherosclerosis of lower extremity with claudication (HCC) - Worsening Right Lower Extremity Angiogram scheduled.   Current  Outpatient Prescriptions on File Prior to Visit  Medication Sig Dispense Refill  . allopurinol (ZYLOPRIM) 100 MG tablet Take 100 mg by mouth daily.    Marland Kitchen amitriptyline (ELAVIL) 25 MG tablet Take 25 mg by mouth at bedtime.    . Cholecalciferol (VITAMIN D-3) 1000 UNITS CAPS Take by mouth.    . clopidogrel (PLAVIX) 75 MG tablet Take 75 mg by mouth daily with breakfast.    . DULoxetine (CYMBALTA) 60 MG capsule Take 60 mg by mouth daily.    . furosemide (LASIX) 20 MG tablet Take 20 mg by mouth.    . gabapentin (NEURONTIN) 600 MG tablet Take 600 mg by mouth at bedtime.    Marland Kitchen levothyroxine (SYNTHROID, LEVOTHROID) 25 MCG tablet Take 25 mcg by mouth daily before breakfast.    . metoprolol tartrate (LOPRESSOR) 25 MG tablet Take 25 mg by mouth daily.    . montelukast (SINGULAIR) 10 MG tablet Take  10 mg by mouth daily.    Marland Kitchen oxyCODONE (OXY IR/ROXICODONE) 5 MG immediate release tablet Take 5 mg by mouth every 4 (four) hours as needed for severe pain.    Marland Kitchen topiramate (TOPAMAX) 50 MG tablet Take 50 mg by mouth at bedtime.    . traMADol (ULTRAM) 50 MG tablet Take by mouth every 6 (six) hours as needed.    . vitamin B-12 (CYANOCOBALAMIN) 1000 MCG tablet Take 1,000 mcg by mouth daily.    . pantoprazole (PROTONIX) 40 MG tablet Take 40 mg by mouth daily.    . potassium chloride (K-DUR,KLOR-CON) 10 MEQ tablet Take 10 mEq by mouth once.    Orlie Dakin Sodium (SENNA) 8.6-50 MG TABS Take by mouth.    . sodium chloride (DEEP SEA NASAL SPRAY) 0.65 % nasal spray Place 2 sprays into the nose as needed for congestion.    . timolol (BETIMOL) 0.5 % ophthalmic solution Place 1 drop into both eyes daily.    . ursodiol (ACTIGALL) 300 MG capsule Take 300 mg by mouth 2 (two) times daily.     No current facility-administered medications on file prior to visit.     There are no Patient Instructions on file for this visit. No Follow-up on file.   Katelyn Kohlmeyer A Alucard Fearnow, PA-C

## 2016-07-15 ENCOUNTER — Other Ambulatory Visit (INDEPENDENT_AMBULATORY_CARE_PROVIDER_SITE_OTHER): Payer: Self-pay | Admitting: Vascular Surgery

## 2016-07-18 ENCOUNTER — Inpatient Hospital Stay: Admission: RE | Admit: 2016-07-18 | Payer: Medicare Other | Source: Ambulatory Visit

## 2016-07-20 ENCOUNTER — Emergency Department: Payer: Medicare Other

## 2016-07-20 ENCOUNTER — Inpatient Hospital Stay
Admission: EM | Admit: 2016-07-20 | Discharge: 2016-07-22 | DRG: 065 | Disposition: A | Discharge status: Skilled Nursing Facility | Payer: Medicare Other | Attending: Internal Medicine | Admitting: Internal Medicine

## 2016-07-20 DIAGNOSIS — Z7902 Long term (current) use of antithrombotics/antiplatelets: Secondary | ICD-10-CM

## 2016-07-20 DIAGNOSIS — K219 Gastro-esophageal reflux disease without esophagitis: Secondary | ICD-10-CM | POA: Diagnosis present

## 2016-07-20 DIAGNOSIS — M1990 Unspecified osteoarthritis, unspecified site: Secondary | ICD-10-CM | POA: Diagnosis present

## 2016-07-20 DIAGNOSIS — R29707 NIHSS score 7: Secondary | ICD-10-CM | POA: Diagnosis present

## 2016-07-20 DIAGNOSIS — F329 Major depressive disorder, single episode, unspecified: Secondary | ICD-10-CM | POA: Diagnosis present

## 2016-07-20 DIAGNOSIS — Z79899 Other long term (current) drug therapy: Secondary | ICD-10-CM

## 2016-07-20 DIAGNOSIS — G8191 Hemiplegia, unspecified affecting right dominant side: Secondary | ICD-10-CM | POA: Diagnosis present

## 2016-07-20 DIAGNOSIS — I639 Cerebral infarction, unspecified: Principal | ICD-10-CM | POA: Diagnosis present

## 2016-07-20 DIAGNOSIS — E785 Hyperlipidemia, unspecified: Secondary | ICD-10-CM | POA: Diagnosis present

## 2016-07-20 DIAGNOSIS — G894 Chronic pain syndrome: Secondary | ICD-10-CM | POA: Diagnosis present

## 2016-07-20 DIAGNOSIS — M109 Gout, unspecified: Secondary | ICD-10-CM | POA: Diagnosis present

## 2016-07-20 DIAGNOSIS — R262 Difficulty in walking, not elsewhere classified: Secondary | ICD-10-CM

## 2016-07-20 DIAGNOSIS — N183 Chronic kidney disease, stage 3 (moderate): Secondary | ICD-10-CM | POA: Diagnosis present

## 2016-07-20 DIAGNOSIS — Z8673 Personal history of transient ischemic attack (TIA), and cerebral infarction without residual deficits: Secondary | ICD-10-CM

## 2016-07-20 DIAGNOSIS — F419 Anxiety disorder, unspecified: Secondary | ICD-10-CM | POA: Diagnosis present

## 2016-07-20 DIAGNOSIS — I129 Hypertensive chronic kidney disease with stage 1 through stage 4 chronic kidney disease, or unspecified chronic kidney disease: Secondary | ICD-10-CM | POA: Diagnosis present

## 2016-07-20 DIAGNOSIS — J439 Emphysema, unspecified: Secondary | ICD-10-CM | POA: Diagnosis present

## 2016-07-20 DIAGNOSIS — I739 Peripheral vascular disease, unspecified: Secondary | ICD-10-CM | POA: Diagnosis present

## 2016-07-20 DIAGNOSIS — R531 Weakness: Secondary | ICD-10-CM | POA: Diagnosis not present

## 2016-07-20 DIAGNOSIS — I69351 Hemiplegia and hemiparesis following cerebral infarction affecting right dominant side: Secondary | ICD-10-CM

## 2016-07-20 DIAGNOSIS — R1310 Dysphagia, unspecified: Secondary | ICD-10-CM | POA: Diagnosis present

## 2016-07-20 DIAGNOSIS — R4781 Slurred speech: Secondary | ICD-10-CM | POA: Diagnosis present

## 2016-07-20 LAB — URINALYSIS COMPLETE WITH MICROSCOPIC (ARMC ONLY)
BACTERIA UA: NONE SEEN
Bilirubin Urine: NEGATIVE
GLUCOSE, UA: NEGATIVE mg/dL
Hgb urine dipstick: NEGATIVE
KETONES UR: NEGATIVE mg/dL
NITRITE: NEGATIVE
Protein, ur: NEGATIVE mg/dL
SPECIFIC GRAVITY, URINE: 1.005 (ref 1.005–1.030)
pH: 6 (ref 5.0–8.0)

## 2016-07-20 LAB — TROPONIN I

## 2016-07-20 LAB — COMPREHENSIVE METABOLIC PANEL
ALT: 7 U/L — ABNORMAL LOW (ref 14–54)
ANION GAP: 7 (ref 5–15)
AST: 16 U/L (ref 15–41)
Albumin: 3.1 g/dL — ABNORMAL LOW (ref 3.5–5.0)
Alkaline Phosphatase: 118 U/L (ref 38–126)
BUN: 17 mg/dL (ref 6–20)
CHLORIDE: 104 mmol/L (ref 101–111)
CO2: 27 mmol/L (ref 22–32)
CREATININE: 1.36 mg/dL — AB (ref 0.44–1.00)
Calcium: 9 mg/dL (ref 8.9–10.3)
GFR, EST AFRICAN AMERICAN: 40 mL/min — AB (ref >60)
GFR, EST NON AFRICAN AMERICAN: 34 mL/min — AB (ref >60)
Glucose, Bld: 90 mg/dL (ref 65–99)
POTASSIUM: 4.3 mmol/L (ref 3.5–5.1)
SODIUM: 138 mmol/L (ref 135–145)
Total Bilirubin: 0.3 mg/dL (ref 0.3–1.2)
Total Protein: 7.3 g/dL (ref 6.5–8.1)

## 2016-07-20 LAB — CBC WITH DIFFERENTIAL/PLATELET
Basophils Absolute: 0.1 10*3/uL (ref 0–0.1)
Basophils Relative: 1 %
EOS ABS: 0.2 10*3/uL (ref 0–0.7)
Eosinophils Relative: 3 %
HEMATOCRIT: 36.2 % (ref 35.0–47.0)
HEMOGLOBIN: 12 g/dL (ref 12.0–16.0)
LYMPHS ABS: 1.6 10*3/uL (ref 1.0–3.6)
LYMPHS PCT: 17 %
MCH: 31.3 pg (ref 26.0–34.0)
MCHC: 33.2 g/dL (ref 32.0–36.0)
MCV: 94.3 fL (ref 80.0–100.0)
Monocytes Absolute: 0.5 10*3/uL (ref 0.2–0.9)
Monocytes Relative: 5 %
NEUTROS ABS: 6.8 10*3/uL — AB (ref 1.4–6.5)
NEUTROS PCT: 74 %
Platelets: 228 10*3/uL (ref 150–440)
RBC: 3.84 MIL/uL (ref 3.80–5.20)
RDW: 15.8 % — ABNORMAL HIGH (ref 11.5–14.5)
WBC: 9.2 10*3/uL (ref 3.6–11.0)

## 2016-07-20 LAB — URINE DRUG SCREEN, QUALITATIVE (ARMC ONLY)
AMPHETAMINES, UR SCREEN: NOT DETECTED
BARBITURATES, UR SCREEN: NOT DETECTED
BENZODIAZEPINE, UR SCRN: NOT DETECTED
COCAINE METABOLITE, UR ~~LOC~~: NOT DETECTED
Cannabinoid 50 Ng, Ur ~~LOC~~: NOT DETECTED
MDMA (Ecstasy)Ur Screen: NOT DETECTED
METHADONE SCREEN, URINE: NOT DETECTED
Opiate, Ur Screen: NOT DETECTED
Phencyclidine (PCP) Ur S: NOT DETECTED
TRICYCLIC, UR SCREEN: POSITIVE — AB

## 2016-07-20 LAB — APTT: APTT: 44 s — AB (ref 24–36)

## 2016-07-20 LAB — PROTIME-INR
INR: 1.12
Prothrombin Time: 14.5 seconds (ref 11.4–15.2)

## 2016-07-20 LAB — MRSA PCR SCREENING: MRSA by PCR: NEGATIVE

## 2016-07-20 MED ORDER — DULOXETINE HCL 60 MG PO CPEP
60.0000 mg | ORAL_CAPSULE | Freq: Every day | ORAL | Status: DC
  Administered 2016-07-21 – 2016-07-22 (×2): 60 mg via ORAL
  Filled 2016-07-20 (×2): qty 1

## 2016-07-20 MED ORDER — GABAPENTIN 100 MG PO CAPS
100.0000 mg | ORAL_CAPSULE | ORAL | Status: DC
  Administered 2016-07-21 – 2016-07-22 (×2): 100 mg via ORAL
  Filled 2016-07-20 (×3): qty 1
  Filled 2016-07-20: qty 2

## 2016-07-20 MED ORDER — METOPROLOL SUCCINATE ER 25 MG PO TB24
25.0000 mg | ORAL_TABLET | Freq: Every day | ORAL | Status: DC
  Administered 2016-07-21 – 2016-07-22 (×2): 25 mg via ORAL
  Filled 2016-07-20 (×2): qty 1

## 2016-07-20 MED ORDER — ACETAMINOPHEN 650 MG RE SUPP: 650.0000 mg | Freq: Four times a day (QID) | RECTAL | Status: DC | PRN

## 2016-07-20 MED ORDER — MONTELUKAST SODIUM 10 MG PO TABS
10.0000 mg | ORAL_TABLET | Freq: Every day | ORAL | Status: DC
  Administered 2016-07-20 – 2016-07-21 (×2): 10 mg via ORAL
  Filled 2016-07-20 (×2): qty 1

## 2016-07-20 MED ORDER — SODIUM CHLORIDE 0.9% FLUSH
3.0000 mL | Freq: Two times a day (BID) | INTRAVENOUS | Status: DC
  Administered 2016-07-20 – 2016-07-22 (×5): 3 mL via INTRAVENOUS

## 2016-07-20 MED ORDER — OXYCODONE HCL 5 MG PO TABS
5.0000 mg | ORAL_TABLET | Freq: Four times a day (QID) | ORAL | Status: DC | PRN
  Administered 2016-07-20 – 2016-07-21 (×4): 5 mg via ORAL
  Filled 2016-07-20 (×4): qty 1

## 2016-07-20 MED ORDER — ALLOPURINOL 100 MG PO TABS
100.0000 mg | ORAL_TABLET | Freq: Every day | ORAL | Status: DC
  Administered 2016-07-21 – 2016-07-22 (×2): 100 mg via ORAL
  Filled 2016-07-20 (×2): qty 1

## 2016-07-20 MED ORDER — ENOXAPARIN SODIUM 40 MG/0.4ML ~~LOC~~ SOLN
40.0000 mg | SUBCUTANEOUS | Status: DC
  Administered 2016-07-20 – 2016-07-21 (×2): 40 mg via SUBCUTANEOUS
  Filled 2016-07-20 (×2): qty 0.4

## 2016-07-20 MED ORDER — ONDANSETRON HCL 4 MG/2ML IJ SOLN: 4.0000 mg | Freq: Four times a day (QID) | INTRAMUSCULAR | Status: DC | PRN

## 2016-07-20 MED ORDER — GABAPENTIN 300 MG PO CAPS
300.0000 mg | ORAL_CAPSULE | Freq: Two times a day (BID) | ORAL | Status: DC
  Administered 2016-07-20 – 2016-07-22 (×4): 300 mg via ORAL
  Filled 2016-07-20 (×3): qty 1

## 2016-07-20 MED ORDER — DOCUSATE SODIUM 100 MG PO CAPS
100.0000 mg | ORAL_CAPSULE | Freq: Two times a day (BID) | ORAL | Status: DC
  Administered 2016-07-20 – 2016-07-22 (×5): 100 mg via ORAL
  Filled 2016-07-20 (×5): qty 1

## 2016-07-20 MED ORDER — ALBUTEROL SULFATE (2.5 MG/3ML) 0.083% IN NEBU: 2.5000 mg | INHALATION_SOLUTION | RESPIRATORY_TRACT | Status: DC | PRN

## 2016-07-20 MED ORDER — GABAPENTIN 100 MG PO CAPS: 100.0000 mg | ORAL_CAPSULE | Freq: Three times a day (TID) | ORAL | Status: DC

## 2016-07-20 MED ORDER — BACID PO TABS
1.0000 | ORAL_TABLET | Freq: Every day | ORAL | Status: DC
  Administered 2016-07-21 – 2016-07-22 (×2): 1 via ORAL
  Filled 2016-07-20 (×3): qty 1

## 2016-07-20 MED ORDER — URSODIOL 300 MG PO CAPS
300.0000 mg | ORAL_CAPSULE | Freq: Two times a day (BID) | ORAL | Status: DC
  Administered 2016-07-20 – 2016-07-22 (×4): 300 mg via ORAL
  Filled 2016-07-20 (×6): qty 1

## 2016-07-20 MED ORDER — ASPIRIN EC 81 MG PO TBEC
81.0000 mg | DELAYED_RELEASE_TABLET | Freq: Every day | ORAL | Status: DC
  Administered 2016-07-21 – 2016-07-22 (×2): 81 mg via ORAL
  Filled 2016-07-20 (×3): qty 1

## 2016-07-20 MED ORDER — AMITRIPTYLINE HCL 25 MG PO TABS
25.0000 mg | ORAL_TABLET | Freq: Every day | ORAL | Status: DC
  Administered 2016-07-20 – 2016-07-21 (×2): 25 mg via ORAL
  Filled 2016-07-20 (×2): qty 1

## 2016-07-20 MED ORDER — CLOPIDOGREL BISULFATE 75 MG PO TABS
75.0000 mg | ORAL_TABLET | Freq: Every day | ORAL | Status: DC
  Administered 2016-07-21 – 2016-07-22 (×2): 75 mg via ORAL
  Filled 2016-07-20 (×2): qty 1

## 2016-07-20 MED ORDER — POLYETHYLENE GLYCOL 3350 17 G PO PACK: 17.0000 g | PACK | Freq: Every day | ORAL | Status: DC | PRN

## 2016-07-20 MED ORDER — LEVOTHYROXINE SODIUM 25 MCG PO TABS
25.0000 ug | ORAL_TABLET | Freq: Every day | ORAL | Status: DC
  Administered 2016-07-21 – 2016-07-22 (×2): 25 ug via ORAL
  Filled 2016-07-20 (×2): qty 1

## 2016-07-20 MED ORDER — PANTOPRAZOLE SODIUM 40 MG PO TBEC
40.0000 mg | DELAYED_RELEASE_TABLET | Freq: Every day | ORAL | Status: DC
  Administered 2016-07-21 – 2016-07-22 (×2): 40 mg via ORAL
  Filled 2016-07-20 (×2): qty 1

## 2016-07-20 MED ORDER — ONDANSETRON HCL 4 MG PO TABS: 4.0000 mg | ORAL_TABLET | Freq: Four times a day (QID) | ORAL | Status: DC | PRN

## 2016-07-20 MED ORDER — ASPIRIN 81 MG PO CHEW
324.0000 mg | CHEWABLE_TABLET | Freq: Once | ORAL | Status: AC
  Administered 2016-07-20: 324 mg via ORAL
  Filled 2016-07-20: qty 4

## 2016-07-20 MED ORDER — TOPIRAMATE 25 MG PO TABS
25.0000 mg | ORAL_TABLET | Freq: Every day | ORAL | Status: DC
  Administered 2016-07-20 – 2016-07-21 (×2): 25 mg via ORAL
  Filled 2016-07-20 (×3): qty 1

## 2016-07-20 MED ORDER — ACETAMINOPHEN 325 MG PO TABS
650.0000 mg | ORAL_TABLET | Freq: Four times a day (QID) | ORAL | Status: DC | PRN
  Administered 2016-07-20: 650 mg via ORAL
  Filled 2016-07-20: qty 2

## 2016-07-20 NOTE — Progress Notes (Signed)
New admission from the ED with stroke diagnosis beyond stroke code protocol due to time lapse. Alert, oriented. Mild slur to speech, mild aphasia. RUE and RLE weakness. Reports pain 8/10 in feet which is chronic with dry scabbing on affected toes with oxycodone given. MRSA PCR swab obtained and sent to lab.  Pt on review of code status states she does "not want to be brought back". Has yellow DNR form from facility. No family with pt. Pt is alert/oriented/appropriate.

## 2016-07-20 NOTE — ED Notes (Signed)
2 attempts made to obtain IV, unsuccessful by me.

## 2016-07-20 NOTE — ED Triage Notes (Addendum)
Pt arrives from [facility] via [facility] c/o right sided weakness X 2 days. Pt states she got weak last night. Staff also concerned with slurred speech. Both new for patient, LNW X 2 days ago. Pt alert to self, place and situation, disoriented to year. Grip strength to right hand weaker than left.   CBG 84.

## 2016-07-20 NOTE — ED Provider Notes (Signed)
[facility] Emergency Department Provider Note   ____________________________________________    I have reviewed the triage vital signs and the nursing notes.   HISTORY  Chief Complaint Weakness     HPI [name] is a 82 y.o. female who presents with complaints of right-sided weakness patient notes yesterday evening she felt weakness in her right arm and right leg but thought that she would feel better after sleeping. Today she continues to have weakness. She denies headache. She denies change in vision. No nausea or vomiting. No history of similar symptoms. No fevers or chills   Past Medical History:  Diagnosis Date  . Anxiety   . Arthritis   . Asthma   . Calculus of gallbladder without mention of cholecystitis, with obstruction   . Candidiasis of the esophagus   . Chronic kidney disease, stage III (moderate)   . Chronic pain syndrome   . Corns and callosities   . Depression   . Emphysema lung ([place])   . Esophageal reflux   . Essential hypertension, benign   . Gout, unspecified   . Hypertension   . Hypoglycemia, unspecified   . Hypopotassemia   . Migraine, unspecified, without mention of intractable migraine without mention of status migrainosus   . Other vitamin B12 deficiency anemia   . Personal history of fall   . Unspecified asthma(493.90)   . Unspecified constipation   . Unspecified hypertensive heart disease with heart failure(402.91)   . Unspecified vitamin D deficiency     Patient Active Problem List   Diagnosis Date Noted  . Essential hypertension 07/14/2016  . Atherosclerosis of lower extremity with claudication ([place]) 07/14/2016  . PVD (peripheral vascular disease) ([place]) 07/14/2016    Past Surgical History:  Procedure Laterality Date  . ABDOMINAL HYSTERECTOMY    . arterial thrombi stents in both legs    . BACK SURGERY    . COLONOSCOPY    . ESOPHAGOGASTRODUODENOSCOPY (EGD) WITH PROPOFOL N/A 06/19/2016   Procedure:  ESOPHAGOGASTRODUODENOSCOPY (EGD) WITH PROPOFOL;  Surgeon: Lollie Sails, MD;  Location: [facility] ENDOSCOPY;  Service: Endoscopy;  Laterality: N/A;  . EYE SURGERY     cataract extraction    Prior to Admission medications   Medication Sig Start Date End Date Taking? Authorizing Provider  allopurinol (ZYLOPRIM) 100 MG tablet Take 100 mg by mouth daily.   Yes Historical Provider, MD  amitriptyline (ELAVIL) 25 MG tablet Take 25 mg by mouth at bedtime.   Yes Historical Provider, MD  Cholecalciferol (VITAMIN D-3) 1000 UNITS CAPS Take 2,000 Units by mouth daily.    Yes Historical Provider, MD  clopidogrel (PLAVIX) 75 MG tablet Take 75 mg by mouth daily with breakfast.   Yes Historical Provider, MD  DULoxetine (CYMBALTA) 60 MG capsule Take 60 mg by mouth daily.   Yes Historical Provider, MD  furosemide (LASIX) 20 MG tablet Take 20 mg by mouth daily.    Yes Historical Provider, MD  gabapentin (NEURONTIN) 100 MG capsule Take 100 mg by mouth 3 (three) times daily. Take 1 cap (100mg ) in morning, 3 caps (300mg ) at 1400 and 2100   Yes Historical Provider, MD  levothyroxine (SYNTHROID, LEVOTHROID) 25 MCG tablet Take 25 mcg by mouth daily before breakfast.   Yes Historical Provider, MD  metoprolol succinate (TOPROL-XL) 25 MG 24 hr tablet Take 25 mg by mouth daily.   Yes Historical Provider, MD  montelukast (SINGULAIR) 10 MG tablet Take 10 mg by mouth daily.   Yes Historical Provider, MD  oxyCODONE (OXY IR/ROXICODONE) 5 MG immediate release tablet Take 5 mg by mouth every 6 (six) hours as needed for severe pain.    Yes Historical Provider, MD  pantoprazole (PROTONIX) 40 MG tablet Take 40 mg by mouth daily.   Yes Historical Provider, MD  Probiotic Product (RISA-BID PROBIOTIC) TABS Take 1 tablet by mouth daily.   Yes Historical Provider, MD  Sennosides-Docusate Sodium (SENNA) 8.6-50 MG TABS Take 1 tablet by mouth every three (3) days as needed (for no bowel movement (constipation)).    Yes Historical Provider, MD    topiramate (TOPAMAX) 25 MG tablet Take 25 mg by mouth at bedtime.   Yes Historical Provider, MD  traMADol (ULTRAM) 50 MG tablet Take 50 mg by mouth every 8 (eight) hours as needed (for pain control).    Yes Historical Provider, MD  ursodiol (ACTIGALL) 300 MG capsule Take 300 mg by mouth 2 (two) times daily.   Yes Historical Provider, MD  vitamin B-12 (CYANOCOBALAMIN) 1000 MCG tablet Take 1,000 mcg by mouth daily.   Yes Historical Provider, MD     Allergies Patient has no known allergies.  No family history on file.  Social History Social History  Substance Use Topics  . Smoking status: Never Smoker  . Smokeless tobacco: Never Used  . Alcohol use No    Review of Systems  Constitutional: No fever/chills Eyes: No visual changes.   Cardiovascular: Denies chest pain. Respiratory: Denies shortness of breath.  Genitourinary: Negative for dysuria.  Skin: Negative for rash. Neurological: As above  10-point ROS otherwise negative.  ____________________________________________   PHYSICAL EXAM:  VITAL SIGNS: ED Triage Vitals  Enc Vitals Group     BP 07/20/16 1224 (!) 115/51     Pulse Rate 07/20/16 1224 66     Resp 07/20/16 1224 20     Temp --      Temp Source 07/20/16 1224 Oral     SpO2 07/20/16 1224 98 %     Weight 07/20/16 1226 180 lb (81.6 kg)     Height --      Head Circumference --      Peak Flow --      Pain Score --      Pain Loc --      Pain Edu? --      Excl. in [place]? --     Constitutional: Alert and oriented. No acute distress. Pleasant and interactive Eyes: Conjunctivae are normal. PERRLA, EOMI Head: Atraumatic. Nose: No congestion/rhinnorhea. Mouth/Throat: Mucous membranes are moist.   Neck:  Painless ROM Cardiovascular: Normal rate, regular rhythm. Grossly normal heart sounds.  Good peripheral circulation. Respiratory: Normal respiratory effort.  No retractions. Lungs CTAB. Gastrointestinal: Soft and nontender. No distention.  No CVA  tenderness. Genitourinary: deferred Musculoskeletal: No lower extremity tenderness nor edema.  Warm and well perfused Neurologic:  Normal speech and language. Questionable right facial droop, noticeable weakness in the right arm and right leg Skin:  Skin is warm, dry and intact. No rash noted. Psychiatric: Mood and affect are normal. Speech and behavior are normal.  ____________________________________________   LABS (all labs ordered are listed, but only abnormal results are displayed)  Labs Reviewed  COMPREHENSIVE METABOLIC PANEL - Abnormal; Notable for the following:       Result Value   Creatinine, Ser 1.36 (*)    Albumin 3.1 (*)    ALT 7 (*)    GFR calc non Af Amer 34 (*)    GFR calc Af Amer 40 (*)  All other components within normal limits  CBC WITH DIFFERENTIAL/PLATELET - Abnormal; Notable for the following:    RDW 15.8 (*)    Neutro Abs 6.8 (*)    All other components within normal limits  TROPONIN I  APTT  PROTIME-INR  URINE DRUG SCREEN, QUALITATIVE ([facility] ONLY)  URINALYSIS COMPLETEWITH MICROSCOPIC ([facility] ONLY)   ____________________________________________  EKG  ED ECG REPORT I, Lavonia Drafts, the attending physician, personally viewed and interpreted this ECG.  Date: 07/20/2016 EKG Time: 12:29 PM Rate: 64 Rhythm: normal sinus rhythm QRS Axis: normal Intervals: normal ST/T Wave abnormalities: normal Conduction Disturbances: none   ____________________________________________  RADIOLOGY  CT head unremarkable ____________________________________________   PROCEDURES  Procedure(s) performed: No    Critical Care performed: No ____________________________________________   INITIAL IMPRESSION / ASSESSMENT AND PLAN / ED COURSE  Pertinent labs & imaging results that were available during my care of the patient were reviewed by me and considered in my medical decision making (see chart for details).  Patient presents with new-onset right arm  and right leg weakness which apparently started yesterday, outside of the window, TPA is not a consideration given this. CT head is unremarkable, lab work is otherwise normal. Aspirin given in the ED, we'll admit to the hospitalist for further workup  Clinical Course    ____________________________________________   FINAL CLINICAL IMPRESSION(S) / ED DIAGNOSES  Final diagnoses:  Cerebrovascular accident (CVA), unspecified mechanism ([place])      NEW MEDICATIONS STARTED DURING THIS VISIT:  New Prescriptions   No medications on file     Note:  This document was prepared using Dragon voice recognition software and may include unintentional dictation errors.    Lavonia Drafts, MD 07/20/16 [phone]

## 2016-07-20 NOTE — ED Notes (Signed)
Patient transported to CT 

## 2016-07-20 NOTE — H&P (Signed)
[facility] [place] NAME: Danikah Budzik    MR#:  [id number]  DATE OF BIRTH:  1928/06/13  DATE OF ADMISSION:  07/20/2016  PRIMARY CARE PHYSICIAN: Rosaland Lao, NP   REQUESTING/REFERRING PHYSICIAN: Dr. Corky Downs  CHIEF COMPLAINT:   Chief Complaint  Patient presents with  . Weakness    HISTORY OF PRESENT ILLNESS:  Leanny Newburg  is a 82 y.o. female with a known history of HTN, COPD, Arthritis who walks with a walker at baseline noticed significant right weakness and numbness yesterday. She waited Hoping symptoms would improve. Today due to persistent right-sided weakness and numbness patient presented to the emergency room. She also has slurred speech but no dysphagia. No change in vision. No history of strokes as per patient but CT scan of the head done today shows old bilateral small strokes but nothing new. No history of atrial fibrillation. Patient takes aspirin at home.  PAST MEDICAL HISTORY:   Past Medical History:  Diagnosis Date  . Anxiety   . Arthritis   . Asthma   . Calculus of gallbladder without mention of cholecystitis, with obstruction   . Candidiasis of the esophagus   . Chronic kidney disease, stage III (moderate)   . Chronic pain syndrome   . Corns and callosities   . Depression   . Emphysema lung ([place])   . Esophageal reflux   . Essential hypertension, benign   . Gout, unspecified   . Hypertension   . Hypoglycemia, unspecified   . Hypopotassemia   . Migraine, unspecified, without mention of intractable migraine without mention of status migrainosus   . Other vitamin B12 deficiency anemia   . Personal history of fall   . Unspecified asthma(493.90)   . Unspecified constipation   . Unspecified hypertensive heart disease with heart failure(402.91)   . Unspecified vitamin D deficiency     PAST SURGICAL HISTORY:   Past Surgical History:  Procedure Laterality Date  . ABDOMINAL HYSTERECTOMY    . arterial  thrombi stents in both legs    . BACK SURGERY    . COLONOSCOPY    . ESOPHAGOGASTRODUODENOSCOPY (EGD) WITH PROPOFOL N/A 06/19/2016   Procedure: ESOPHAGOGASTRODUODENOSCOPY (EGD) WITH PROPOFOL;  Surgeon: Lollie Sails, MD;  Location: [facility] ENDOSCOPY;  Service: Endoscopy;  Laterality: N/A;  . EYE SURGERY     cataract extraction    SOCIAL HISTORY:   Social History  Substance Use Topics  . Smoking status: Never Smoker  . Smokeless tobacco: Never Used  . Alcohol use No    FAMILY HISTORY:   Family History  Problem Relation Age of Onset  . Cancer Father   . Stroke Father     DRUG ALLERGIES:  No Known Allergies  REVIEW OF SYSTEMS:   Review of Systems  Constitutional: Negative for chills and fever.  HENT: Negative for sore throat.   Eyes: Negative for blurred vision, double vision and pain.  Respiratory: Negative for cough, hemoptysis, shortness of breath and wheezing.   Cardiovascular: Negative for chest pain, palpitations, orthopnea and leg swelling.  Gastrointestinal: Negative for abdominal pain, constipation, diarrhea, heartburn, nausea and vomiting.  Genitourinary: Negative for dysuria and hematuria.  Musculoskeletal: Negative for back pain and joint pain.  Skin: Negative for rash.  Neurological: Positive for sensory change, focal weakness and weakness. Negative for speech change and headaches.  Endo/Heme/Allergies: Does not bruise/bleed easily.  Psychiatric/Behavioral: Negative for depression. The patient is not nervous/anxious.     MEDICATIONS AT HOME:  Prior to Admission medications   Medication Sig Start Date End Date Taking? Authorizing Provider  allopurinol (ZYLOPRIM) 100 MG tablet Take 100 mg by mouth daily.   Yes Historical Provider, MD  amitriptyline (ELAVIL) 25 MG tablet Take 25 mg by mouth at bedtime.   Yes Historical Provider, MD  Cholecalciferol (VITAMIN D-3) 1000 UNITS CAPS Take 2,000 Units by mouth daily.    Yes Historical Provider, MD  clopidogrel  (PLAVIX) 75 MG tablet Take 75 mg by mouth daily with breakfast.   Yes Historical Provider, MD  DULoxetine (CYMBALTA) 60 MG capsule Take 60 mg by mouth daily.   Yes Historical Provider, MD  furosemide (LASIX) 20 MG tablet Take 20 mg by mouth daily.    Yes Historical Provider, MD  gabapentin (NEURONTIN) 100 MG capsule Take 100 mg by mouth 3 (three) times daily. Take 1 cap (100mg ) in morning, 3 caps (300mg ) at 1400 and 2100   Yes Historical Provider, MD  levothyroxine (SYNTHROID, LEVOTHROID) 25 MCG tablet Take 25 mcg by mouth daily before breakfast.   Yes Historical Provider, MD  metoprolol succinate (TOPROL-XL) 25 MG 24 hr tablet Take 25 mg by mouth daily.   Yes Historical Provider, MD  montelukast (SINGULAIR) 10 MG tablet Take 10 mg by mouth daily.   Yes Historical Provider, MD  oxyCODONE (OXY IR/ROXICODONE) 5 MG immediate release tablet Take 5 mg by mouth every 6 (six) hours as needed for severe pain.    Yes Historical Provider, MD  pantoprazole (PROTONIX) 40 MG tablet Take 40 mg by mouth daily.   Yes Historical Provider, MD  Probiotic Product (RISA-BID PROBIOTIC) TABS Take 1 tablet by mouth daily.   Yes Historical Provider, MD  Sennosides-Docusate Sodium (SENNA) 8.6-50 MG TABS Take 1 tablet by mouth every three (3) days as needed (for no bowel movement (constipation)).    Yes Historical Provider, MD  topiramate (TOPAMAX) 25 MG tablet Take 25 mg by mouth at bedtime.   Yes Historical Provider, MD  traMADol (ULTRAM) 50 MG tablet Take 50 mg by mouth every 8 (eight) hours as needed (for pain control).    Yes Historical Provider, MD  ursodiol (ACTIGALL) 300 MG capsule Take 300 mg by mouth 2 (two) times daily.   Yes Historical Provider, MD  vitamin B-12 (CYANOCOBALAMIN) 1000 MCG tablet Take 1,000 mcg by mouth daily.   Yes Historical Provider, MD     VITAL SIGNS:  Blood pressure (!) 115/51, pulse 66, resp. rate 20, weight 81.6 kg (180 lb), SpO2 98 %.  PHYSICAL EXAMINATION:  Physical Exam  GENERAL:   82 y.o.-year-old patient lying in the bed with no acute distress.  EYES: Pupils equal, round, reactive to light and accommodation. No scleral icterus. Extraocular muscles intact.  HEENT: Head atraumatic, normocephalic. Oropharynx and nasopharynx clear. No oropharyngeal erythema, moist oral mucosa  NECK:  Supple, no jugular venous distention. No thyroid enlargement, no tenderness.  LUNGS: Normal breath sounds bilaterally, no wheezing, rales, rhonchi. No use of accessory muscles of respiration.  CARDIOVASCULAR: S1, S2 normal. No murmurs, rubs, or gallops.  ABDOMEN: Soft, nontender, nondistended. Bowel sounds present. No organomegaly or mass.  EXTREMITIES: No pedal edema, cyanosis, or clubbing. + 2 pedal & radial pulses b/l.   NEUROLOGIC: Cranial nerves II through XII are intact. Slurred speech. Right motor 4-/5 and Left motor 5/5. Sensations decreased right. PSYCHIATRIC: The patient is alert and oriented x 3. Good affect.  SKIN: No obvious rash, lesion, or ulcer.   LABORATORY PANEL:   CBC  Recent Labs  Lab 07/20/16 1345  WBC 9.2  HGB 12.0  HCT 36.2  PLT 228   ------------------------------------------------------------------------------------------------------------------  Chemistries   Recent Labs Lab 07/20/16 1345  NA 138  K 4.3  CL 104  CO2 27  GLUCOSE 90  BUN 17  CREATININE 1.36*  CALCIUM 9.0  AST 16  ALT 7*  ALKPHOS 118  BILITOT 0.3   ------------------------------------------------------------------------------------------------------------------  Cardiac Enzymes  Recent Labs Lab 07/20/16 1345  TROPONINI <0.03   ------------------------------------------------------------------------------------------------------------------  RADIOLOGY:  Ct Head Wo Contrast  Result Date: 07/20/2016 CLINICAL DATA:  Right-sided weakness. EXAM: CT HEAD WITHOUT CONTRAST TECHNIQUE: Contiguous axial images were obtained from the base of the skull through the vertex without  intravenous contrast. COMPARISON:  None. FINDINGS: Brain: No subdural, epidural, or subarachnoid hemorrhage. Ventricles and sulci are stable. Multiple infarcts in the cerebellar hemispheres are stable. Possible lacunar infarcts in the brainstem as well. Basal cisterns are patent. White matter changes are stable. No mass, mass effect, or midline shift. No acute cortical ischemia or infarct. Vascular: Calcified atherosclerotic changes seen in the intracranial carotid arteries. Skull: Normal. Negative for fracture or focal lesion. Sinuses/Orbits: No acute finding. Other: None. IMPRESSION: No acute intracranial abnormality identified. Chronic changes as above. 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 - Lovenox for DVT prophylaxis. - PT/OT/Speech consult as needed per symptoms - Neuro checks every 4 hours for 24 hours. - Consult neurology. - Fall precautions  * Hypertension Hold meds for permissive HTN   All the records are reviewed and case discussed with ED provider. Management plans discussed with the patient, family and they are in agreement.  CODE STATUS: FULL CODE  TOTAL TIME TAKING CARE OF THIS PATIENT: 40 minutes.   Hillary Bow R M.D on 07/20/2016 at 2:55 PM  Between 7am to 6pm - Pager - [phone]  After 6pm go to [email or web address] - password [email or web address] [facility]  Office  [phone]  CC: Primary care physician; Rosaland Lao, NP  Note: This dictation was prepared with Dragon dictation along with smaller phrase technology. Any transcriptional errors that result from this process are unintentional.

## 2016-07-20 NOTE — Progress Notes (Signed)
[place] responded to an OR for prayer in room 111. Pt was in bed. Pt stated that her Rt arm and side was weak and she was unable to use it as she did this morning. CH provided prayer for healing. Pt was appreciative and asked to be continued in prayer.    07/20/16 2000  Clinical Encounter Type  Visited With Patient  Visit Type Initial;Spiritual support  Spiritual Encounters  Spiritual Needs Prayer

## 2016-07-20 NOTE — Care Management Obs Status (Signed)
[place] NOTIFICATION   Patient Details  Name: [name] MRN: [id number] Date of Birth: [birth year]   Medicare Observation Status Notification Given:  Yes    CrutchfieldAntony Haste, RN 07/20/2016, 7:13 PM

## 2016-07-21 ENCOUNTER — Observation Stay: Payer: Medicare Other

## 2016-07-21 ENCOUNTER — Telehealth (INDEPENDENT_AMBULATORY_CARE_PROVIDER_SITE_OTHER): Payer: Self-pay | Admitting: Vascular Surgery

## 2016-07-21 ENCOUNTER — Observation Stay
Admit: 2016-07-21 | Discharge: 2016-07-21 | Disposition: A | Discharge status: Home / Self Care | Payer: Medicare Other | Attending: Internal Medicine | Admitting: Internal Medicine

## 2016-07-21 DIAGNOSIS — M1990 Unspecified osteoarthritis, unspecified site: Secondary | ICD-10-CM | POA: Diagnosis present

## 2016-07-21 DIAGNOSIS — R531 Weakness: Secondary | ICD-10-CM | POA: Diagnosis present

## 2016-07-21 DIAGNOSIS — R1310 Dysphagia, unspecified: Secondary | ICD-10-CM | POA: Diagnosis present

## 2016-07-21 DIAGNOSIS — I739 Peripheral vascular disease, unspecified: Secondary | ICD-10-CM | POA: Diagnosis present

## 2016-07-21 DIAGNOSIS — M109 Gout, unspecified: Secondary | ICD-10-CM | POA: Diagnosis present

## 2016-07-21 DIAGNOSIS — Z7902 Long term (current) use of antithrombotics/antiplatelets: Secondary | ICD-10-CM | POA: Diagnosis not present

## 2016-07-21 DIAGNOSIS — J439 Emphysema, unspecified: Secondary | ICD-10-CM | POA: Diagnosis present

## 2016-07-21 DIAGNOSIS — F419 Anxiety disorder, unspecified: Secondary | ICD-10-CM | POA: Diagnosis present

## 2016-07-21 DIAGNOSIS — G8191 Hemiplegia, unspecified affecting right dominant side: Secondary | ICD-10-CM | POA: Diagnosis present

## 2016-07-21 DIAGNOSIS — R29707 NIHSS score 7: Secondary | ICD-10-CM | POA: Diagnosis present

## 2016-07-21 DIAGNOSIS — F329 Major depressive disorder, single episode, unspecified: Secondary | ICD-10-CM | POA: Diagnosis present

## 2016-07-21 DIAGNOSIS — I639 Cerebral infarction, unspecified: Secondary | ICD-10-CM | POA: Diagnosis present

## 2016-07-21 DIAGNOSIS — R4781 Slurred speech: Secondary | ICD-10-CM | POA: Diagnosis present

## 2016-07-21 DIAGNOSIS — I129 Hypertensive chronic kidney disease with stage 1 through stage 4 chronic kidney disease, or unspecified chronic kidney disease: Secondary | ICD-10-CM | POA: Diagnosis present

## 2016-07-21 DIAGNOSIS — N183 Chronic kidney disease, stage 3 (moderate): Secondary | ICD-10-CM | POA: Diagnosis present

## 2016-07-21 DIAGNOSIS — Z79899 Other long term (current) drug therapy: Secondary | ICD-10-CM | POA: Diagnosis not present

## 2016-07-21 DIAGNOSIS — E785 Hyperlipidemia, unspecified: Secondary | ICD-10-CM | POA: Diagnosis present

## 2016-07-21 DIAGNOSIS — K219 Gastro-esophageal reflux disease without esophagitis: Secondary | ICD-10-CM | POA: Diagnosis present

## 2016-07-21 DIAGNOSIS — G894 Chronic pain syndrome: Secondary | ICD-10-CM | POA: Diagnosis present

## 2016-07-21 DIAGNOSIS — Z8673 Personal history of transient ischemic attack (TIA), and cerebral infarction without residual deficits: Secondary | ICD-10-CM | POA: Diagnosis not present

## 2016-07-21 LAB — LIPID PANEL
Cholesterol: 153 mg/dL (ref 0–200)
HDL: 43 mg/dL
LDL Cholesterol: 97 mg/dL (ref 0–99)
Total CHOL/HDL Ratio: 3.6 ratio
Triglycerides: 67 mg/dL
VLDL: 13 mg/dL (ref 0–40)

## 2016-07-21 MED ORDER — MORPHINE SULFATE (PF) 4 MG/ML IV SOLN
2.0000 mg | INTRAVENOUS | Status: DC | PRN
  Administered 2016-07-21: 2 mg via INTRAVENOUS
  Filled 2016-07-21: qty 1

## 2016-07-21 MED ORDER — ATORVASTATIN CALCIUM 20 MG PO TABS
40.0000 mg | ORAL_TABLET | Freq: Every day | ORAL | Status: DC
  Administered 2016-07-21 – 2016-07-22 (×2): 40 mg via ORAL
  Filled 2016-07-21 (×2): qty 2

## 2016-07-21 MED ORDER — MORPHINE SULFATE (PF) 4 MG/ML IV SOLN
2.0000 mg | Freq: Once | INTRAVENOUS | Status: AC
  Administered 2016-07-21: 01:00:00 2 mg via INTRAVENOUS
  Filled 2016-07-21: qty 1

## 2016-07-21 NOTE — Evaluation (Signed)
Physical Therapy Evaluation Patient Details Name: [name] MRN: [id number] DOB: February 10, 1928 Today's Date: 07/21/2016   History of Present Illness  presented to ER secondary to acute onset R sided weakness/numbness; admitted with acute CVA.  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  Clinical Impression  Upon evaluation, patient alert and oriented; follows simple commands. Slow rate of speech with mild dysarthria noted with expressive communication.  Suspected R visual deficits vs. Hemi-inattention (strong preference for L visual field); will continue to assess further in subsequent sessions.  Global R hemiparesis noted (grossly 3-/5) with delay in speed of activation, decrease in coordination and decrease in light touch/sensory awareness noted.    Currently requiring min/mod assist for bed mobility, sit/stand, basic transfers and short-distance gait (5') with RW.  Constant cuing from therapist for dynamic balance, weight shift and midline orientation.  Unsafe to complete without +1 at this time. Recommend OT consult for additional assessment of visual deficits, self-care skills and upper extremity deficits. Would benefit from skilled PT to address above deficits and promote optimal return to PLOF; recommend transition to STR upon discharge from acute hospitalization.   Follow Up Recommendations SNF    Equipment Recommendations  Rolling walker with 5" wheels    Recommendations for Other Services       Precautions / Restrictions Precautions Precautions: Fall Precaution Comments: Nectar/puree diet Restrictions Weight Bearing Restrictions: No      Mobility  Bed Mobility Overal bed mobility: Needs Assistance Bed Mobility: Supine to Sit     Supine to sit: Min assist;Mod assist     General bed mobility comments: assist for R hemi-body and truncal elevation  Transfers Overall transfer level: Needs assistance Equipment used: Rolling  walker (2 wheeled) Transfers: Sit to/from Stand Sit to Stand: Mod assist         General transfer comment: assist for anterior weight translation, lift off from seating surface and midline in M/L plane  Ambulation/Gait Ambulation/Gait assistance: Mod assist;Min assist Ambulation Distance (Feet): 5 Feet Assistive device: Rolling walker (2 wheeled)       General Gait Details: step to gait pattern; constant manual assist for L ant/lateral weight shift to unweight/advance R LE.  Unable to advance R LE without assist; noted genu recurvatum in R LE loading.  Stairs            Wheelchair Mobility    Modified Rankin (Stroke Patients Only)       Balance Overall balance assessment: Needs assistance Sitting-balance support: No upper extremity supported;Feet supported Sitting balance-Leahy Scale: Good     Standing balance support: Bilateral upper extremity supported Standing balance-Leahy Scale: Poor                               Pertinent Vitals/Pain Pain Assessment: Faces Faces Pain Scale: Hurts little more Pain Location: bilat feet/toes Pain Descriptors / Indicators: Aching Pain Intervention(s): Limited activity within patient's tolerance;Monitored during session;Patient requesting pain meds-RN notified    Home Living Family/patient expects to be discharged to:: Assisted living               Home Equipment: Walker - 4 wheels;Cane - single point      Prior Function           Comments: Assist from facility staff for ADLs; mod indep with household distances and amb to/from dining hall (for all meals).  Enjoys Bingo at facility.  Hand Dominance   Dominant Hand: Right    Extremity/Trunk Assessment   Upper Extremity Assessment:  (R UE grossly 3-/5, delayed coordination and speed of activation throughout R UE; decreased light touch throughout)           Lower Extremity Assessment:  (R LE grossly 3-/5 except R ankle PF/DF 0/5, delayed  coordination, decreased light touch throughout)      Cervical / Trunk Assessment:  (tends to maintain L cervical rotation (consistent with R hemi-inattention), but can correct/scan to R when cued by therapist)  Communication   Communication:  (speech slow and slurred/dysarthric)  Cognition Arousal/Alertness: Awake/alert Behavior During Therapy: WFL for tasks assessed/performed Overall Cognitive Status: Within Functional Limits for tasks assessed (slower response time)                      General Comments      Exercises Other Exercises Other Exercises: Visual scanning activities indicative of R inattention-would benefit from further testing; recommend OT consult.  Encouraged visual scanning to R of midline when visiting with friends/family, scanning environment; patient voiced understanding (limited carry-over noted)   Assessment/Plan    PT Assessment Patient needs continued PT services  PT Problem List Decreased strength;Decreased range of motion;Decreased activity tolerance;Decreased balance;Decreased mobility;Decreased coordination;Decreased cognition;Decreased knowledge of use of DME;Decreased safety awareness;Decreased knowledge of precautions          PT Treatment Interventions DME instruction;Gait training;Functional mobility training;Therapeutic activities;Therapeutic exercise;Balance training;Patient/family education;Neuromuscular re-education    PT Goals (Current goals can be found in the Care Plan section)  Acute Rehab PT Goals Patient Stated Goal: to go back to [facility] PT Goal Formulation: With patient/family Time For Goal Achievement: 08/04/16 Potential to Achieve Goals: Good    Frequency 7X/week   Barriers to discharge Decreased caregiver support      Co-evaluation               End of Session Equipment Utilized During Treatment: Gait belt Activity Tolerance: Patient tolerated treatment well Patient left: in chair;with call bell/phone  within reach Nurse Communication: Mobility status    Functional Assessment Tool Used: clinical judgement Functional Limitation: Mobility: Walking and moving around Mobility: Walking and Moving Around Current Status (A2130): At least 40 percent but less than 60 percent impaired, limited or restricted Mobility: Walking and Moving Around Goal Status [phone]): At least 20 percent but less than 40 percent impaired, limited or restricted    Time: 4696-2952 PT Time Calculation (min) (ACUTE ONLY): 25 min   Charges:   PT Evaluation $PT Eval Moderate Complexity: 1 Procedure PT Treatments $Neuromuscular Re-education: 8-22 mins   PT G Codes:   PT G-Codes **NOT FOR INPATIENT CLASS** Functional Assessment Tool Used: clinical judgement Functional Limitation: Mobility: Walking and moving around Mobility: Walking and Moving Around Current Status (W4132): At least 40 percent but less than 60 percent impaired, limited or restricted Mobility: Walking and Moving Around Goal Status [phone]): At least 20 percent but less than 40 percent impaired, limited or restricted    Neelah Mannings H. Owens Shark, PT, DPT, NCS 07/21/16, 2:30 PM [phone]

## 2016-07-21 NOTE — Telephone Encounter (Signed)
[facility] called to let us know that [name] will not be having her procedure this week. She had a stroke and was admitted to hospital.

## 2016-07-21 NOTE — Progress Notes (Addendum)
[facility] [place] NAME: Shagun Wordell    MR#:  [id number]  DATE OF BIRTH:  1928-03-23  SUBJECTIVE:  Came in with acute onset right sided weakness more in the right UE than LE  REVIEW OF SYSTEMS:   Review of Systems  Constitutional: Negative for chills, fever and weight loss.  HENT: Negative for ear discharge, ear pain and nosebleeds.   Eyes: Negative for blurred vision, pain and discharge.  Respiratory: Negative for sputum production, shortness of breath, wheezing and stridor.   Cardiovascular: Positive for claudication. Negative for chest pain, palpitations, orthopnea and PND.  Gastrointestinal: Negative for abdominal pain, diarrhea, nausea and vomiting.  Genitourinary: Negative for frequency and urgency.  Musculoskeletal: Negative for back pain and joint pain.  Neurological: Positive for focal weakness and weakness. Negative for sensory change and speech change.  Psychiatric/Behavioral: Negative for depression and hallucinations. The patient is not nervous/anxious.    Tolerating Diet: Tolerating PT:   DRUG ALLERGIES:  No Known Allergies  VITALS:  Blood pressure (!) 123/53, pulse (!) 55, temperature 97.8 F (36.6 C), temperature source Oral, resp. rate 18, height 5\' 5"  (1.651 m), weight 88.2 kg (194 lb 6.4 oz), SpO2 (!) 84 %.  PHYSICAL EXAMINATION:   Physical Exam  GENERAL:  82 y.o.-year-old patient lying in the bed with no acute distress.  EYES: Pupils equal, round, reactive to light and accommodation. No scleral icterus. Extraocular muscles intact.  HEENT: Head atraumatic, normocephalic. Oropharynx and nasopharynx clear.  NECK:  Supple, no jugular venous distention. No thyroid enlargement, no tenderness.  LUNGS: Normal breath sounds bilaterally, no wheezing, rales, rhonchi. No use of accessory muscles of respiration.  CARDIOVASCULAR: S1, S2 normal. No murmurs, rubs, or gallops.  ABDOMEN: Soft, nontender, nondistended.  Bowel sounds present. No organomegaly or mass.  EXTREMITIES: No cyanosis, clubbing or edema b/l.    NEUROLOGIC: Cranial nerves II through XII are intact. Right sided hemiparesis. No sensory deficit PSYCHIATRIC:  patient is alert and oriented x 3.  SKIN: No obvious rash, lesion, or ulcer. Callous over the toes  LABORATORY PANEL:  CBC  Recent Labs Lab 07/20/16 1345  WBC 9.2  HGB 12.0  HCT 36.2  PLT 228    Chemistries   Recent Labs Lab 07/20/16 1345  NA 138  K 4.3  CL 104  CO2 27  GLUCOSE 90  BUN 17  CREATININE 1.36*  CALCIUM 9.0  AST 16  ALT 7*  ALKPHOS 118  BILITOT 0.3   Cardiac Enzymes  Recent Labs Lab 07/20/16 1345  TROPONINI <0.03   RADIOLOGY:  Ct Head Wo Contrast  Result Date: 07/20/2016 CLINICAL DATA:  Right-sided weakness. EXAM: CT HEAD WITHOUT CONTRAST TECHNIQUE: Contiguous axial images were obtained from the base of the skull through the vertex without intravenous contrast. COMPARISON:  None. FINDINGS: Brain: No subdural, epidural, or subarachnoid hemorrhage. Ventricles and sulci are stable. Multiple infarcts in the cerebellar hemispheres are stable. Possible lacunar infarcts in the brainstem as well. Basal cisterns are patent. White matter changes are stable. No mass, mass effect, or midline shift. No acute cortical ischemia or infarct. Vascular: Calcified atherosclerotic changes seen in the intracranial carotid arteries. Skull: Normal. Negative for fracture or focal lesion. Sinuses/Orbits: No acute finding. Other: None. IMPRESSION: No acute intracranial abnormality identified. Chronic changes as above. Electronically Signed   By: Dorise Bullion III M.D   On: 07/20/2016 13:20   ASSESSMENT AND PLAN:   Makia Bossi  is a 82 y.o. female  with a known history of HTN, COPD, Arthritis who walks with a walker at baseline noticed significant right weakness and numbness yesterday. She waited hoping symptoms would improve. Today due to persistent right-sided weakness  and numbness patient presented to the emergency room. She also has slurred speech but no dysphagia. No change in vision.  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 - Lovenox for DVT prophylaxis. - PT recommends SNF-CSW consulted - improving with right UE weakness - Consult neurology appreciated - Fall precautions  * Hypertension Resumed bp meds   *h/o PAD  pt Was scheduled to get angiogram lower extremity tomorrow. Procedures canceled since patient is in the hospital getting workup for stroke. Pt will get procedure done in next few weeks  Discussed with patient and niece over the phone    Case discussed with Care Management/Social Worker. Management plans discussed with the patient, family and they are in agreement.  CODE STATUS: Full DVT Prophylaxis: Lovenox TOTAL TIME TAKING CARE OF THIS PATIENT: 30 minutes.  >50% time spent on counselling and coordination of care  POSSIBLE D/C IN 1-2 DAYS, DEPENDING ON CLINICAL CONDITION.  Note: This dictation was prepared with Dragon dictation along with smaller phrase technology. Any transcriptional errors that result from this process are unintentional.  Zimir Kittleson M.D on 07/21/2016 at 1:25 PM  Between 7am to 6pm - Pager - [phone]  After 6pm go to [email or web address] - password [email or web address] [facility]  [facility]  Office  [phone]  CC: Primary care physician; Rosaland Lao, NP

## 2016-07-21 NOTE — Plan of Care (Signed)
Problem: Pain Managment: Goal: General experience of comfort will improve Outcome: Not Progressing bil feet  Prn x 2 for c/o pain some relief  Problem: Activity: Goal: Risk for activity intolerance will decrease Outcome: Progressing Chair p.t.

## 2016-07-21 NOTE — Evaluation (Signed)
Clinical/Bedside Swallow Evaluation Patient Details  Name: [name] MRN: [id number] Date of Birth: Feb 21, 1928  Today's Date: 07/21/2016 Time: SLP Start Time (ACUTE ONLY): 84 SLP Stop Time (ACUTE ONLY): 1015 SLP Time Calculation (min) (ACUTE ONLY): 60 min  Past Medical History:  Past Medical History:  Diagnosis Date  . Anxiety   . Arthritis   . Asthma   . Calculus of gallbladder without mention of cholecystitis, with obstruction   . Candidiasis of the esophagus   . Chronic kidney disease, stage III (moderate)   . Chronic pain syndrome   . Corns and callosities   . Depression   . Emphysema lung ([place])   . Esophageal reflux   . Essential hypertension, benign   . Gout, unspecified   . Hypertension   . Hypoglycemia, unspecified   . Hypopotassemia   . Migraine, unspecified, without mention of intractable migraine without mention of status migrainosus   . Other vitamin B12 deficiency anemia   . Personal history of fall   . Unspecified asthma(493.90)   . Unspecified constipation   . Unspecified hypertensive heart disease with heart failure(402.91)   . Unspecified vitamin D deficiency    Past Surgical History:  Past Surgical History:  Procedure Laterality Date  . ABDOMINAL HYSTERECTOMY    . arterial thrombi stents in both legs    . BACK SURGERY    . COLONOSCOPY    . ESOPHAGOGASTRODUODENOSCOPY (EGD) WITH PROPOFOL N/A 06/19/2016   Procedure: ESOPHAGOGASTRODUODENOSCOPY (EGD) WITH PROPOFOL;  Surgeon: Lollie Sails, MD;  Location: [facility] ENDOSCOPY;  Service: Endoscopy;  Laterality: N/A;  . EYE SURGERY     cataract extraction   HPI:  Pt is a 82 y.o. female with a known history of HTN, COPD, Arthritis who walks with a walker at baseline noticed significant right weakness and numbness yesterday. She waited Hoping symptoms would improve. Today due to persistent right-sided weakness and numbness patient presented to the emergency room. She also has slurred speech but no dysphagia.  No change in vision. Pt appears weak w/ low volume of speech but able to answer basic questions re: self and follow basic commands. Of note, pt has Esophageal phase dysphagia c/b small hiatal hernia with small B ring w/ Delay of passage of a standardized barium tablet noted per Barium study 05/2016(GI).   Assessment / Plan / Recommendation Clinical Impression  Pt appears at increased risk for aspiration moreso w/ thin liquids w/ coughing noted post trial of thin liquid and suspected delay of swallowing secondary to audible swallows noted during trials. Pt appeared to adquately tolerate trials of Nectar liquids w/ no overt s/s of aspiration noted; similar presentation w/ puree trials. Oral phase grossly wfl for bolus management and oral clearing. Pt required feeding support; able to hold cup for drinking. Due to pt's suspected Left CVA w/ R sided weakness, pt is at increased risk for dysphagia w/ aspiration and sequelae of such including Pneumonia. Pt would benefit from a modified dsyphagia diet of Dysphagia 1 w/ Nectar liquids w/ strict aspiration precautions; meds in Puree. ST will f/u w/ trials to upgrade and/or objective swallow assessment as indicated. OMEs; Dysarthria assessment to follow.    Aspiration Risk  Moderate aspiration risk    Diet Recommendation  Dysphagia 1 w/ Nectar liquids; strict aspiration precautions; feeding support at meals d/t RUE weakness.   Medication Administration: Whole meds with puree (crush if indicated and able to crush)    Other  Recommendations Recommended Consults:  (Dietician consult) Oral Care Recommendations: Oral  care BID;Staff/trained caregiver to provide oral care Other Recommendations: Order thickener from pharmacy;Prohibited food (jello, ice cream, thin soups);Remove water pitcher;Have oral suction available   Follow up Recommendations Skilled Nursing facility (TBD) vs Inpatient Rehab     Frequency and Duration min 3x week  2 weeks       Prognosis  Prognosis for Safe Diet Advancement: Fair (-Good)      Swallow Study   General Date of Onset: 07/20/16 HPI: Pt is a 82 y.o. female with a known history of HTN, COPD, Arthritis who walks with a walker at baseline noticed significant right weakness and numbness yesterday. She waited Hoping symptoms would improve. Today due to persistent right-sided weakness and numbness patient presented to the emergency room. She also has slurred speech but no dysphagia. No change in vision. Pt appears weak w/ low volume of speech but able to answer basic questions re: self and follow basic commands. Of note, pt has Esophageal phase dysphagia c/b small hiatal hernia with small B ring w/ Delay of passage of a standardized barium tablet noted per Barium study 05/2016(GI). Type of Study: Bedside Swallow Evaluation Previous Swallow Assessment: none indicated Diet Prior to this Study:  (unsure - pt denied any thickened liquids; regular?) Temperature Spikes Noted: No (wbc not elevated, 9.2) Respiratory Status: Room air History of Recent Intubation: No Behavior/Cognition: Alert;Cooperative;Pleasant mood;Distractible;Requires cueing Oral Cavity Assessment: Dry (sticky) Oral Care Completed by SLP: Yes Oral Cavity - Dentition: Missing dentition Vision: Functional for self-feeding Self-Feeding Abilities: Total assist;Needs set up;Needs assist Patient Positioning: Upright in bed Baseline Vocal Quality: Low vocal intensity Volitional Cough: Strong Volitional Swallow: Able to elicit    Oral/Motor/Sensory Function Overall Oral Motor/Sensory Function: Mild impairment (-Moderate impairment) Facial ROM: Reduced right;Suspected CN VII (facial) dysfunction Facial Symmetry: Abnormal symmetry right;Suspected CN VII (facial) dysfunction Facial Strength: Reduced right;Suspected CN VII (facial) dysfunction Facial Sensation: Reduced right (min) Lingual ROM: Reduced right (slight) Lingual Symmetry: Abnormal symmetry right  (slight) Lingual Strength: Within Functional Limits (grossly) Lingual Sensation: Within Functional Limits Velum: Within Functional Limits Mandible: Within Functional Limits   Ice Chips Ice chips: Within functional limits Presentation: Spoon (fed; 3 trials)   Thin Liquid Thin Liquid: Impaired Presentation: Cup;Self Fed (assisted; 3 trials) Oral Phase Impairments:  (grossly wfl) Oral Phase Functional Implications:  (wfl) Pharyngeal  Phase Impairments: Cough - Immediate (x1) Other Comments: moderate    Nectar Thick Nectar Thick Liquid: Within functional limits Presentation: Straw;Self Fed (assisted; ~3 ozs)   Honey Thick Honey Thick Liquid: Not tested   Puree Puree: Within functional limits Presentation: Spoon (fed; ~3 ozs)   Solid   GO   Solid: Not tested Other Comments: d/t overall weakness    Functional Assessment Tool Used: clinical judgement Functional Limitations: Swallowing Swallow Current Status (V9480): At least 20 percent but less than 40 percent impaired, limited or restricted Swallow Goal Status [phone]): At least 20 percent but less than 40 percent impaired, limited or restricted Swallow Discharge Status [phone]): At least 1 percent but less than 20 percent impaired, limited or restricted     Orinda Kenner, MS, CCC-SLP Watson,Katherine 07/21/2016,1:20 PM

## 2016-07-21 NOTE — Consult Note (Signed)
Referring Physician: Posey Pronto    Chief Complaint: Right sided weakness  HPI: Kaaliyah Lirette is an 82 y.o. female presenting with right sided weakness.  Patient reports that on Friday she began to have some dizziness.  This started some time in the evening .  On awakening on Saturday she noted that she had some right sided weakness.  She waited but had no improvement in her symptoms and presented for evaluation on yesterday.  Initial NIHSS of 7.    Date last known well: Date: 07/18/2016 Time last known well: Unable to determine tPA Given: No: Outside time window  Past Medical History:  Diagnosis Date  . Anxiety   . Arthritis   . Asthma   . Calculus of gallbladder without mention of cholecystitis, with obstruction   . Candidiasis of the esophagus   . Chronic kidney disease, stage III (moderate)   . Chronic pain syndrome   . Corns and callosities   . Depression   . Emphysema lung ([place])   . Esophageal reflux   . Essential hypertension, benign   . Gout, unspecified   . Hypertension   . Hypoglycemia, unspecified   . Hypopotassemia   . Migraine, unspecified, without mention of intractable migraine without mention of status migrainosus   . Other vitamin B12 deficiency anemia   . Personal history of fall   . Unspecified asthma(493.90)   . Unspecified constipation   . Unspecified hypertensive heart disease with heart failure(402.91)   . Unspecified vitamin D deficiency     Past Surgical History:  Procedure Laterality Date  . ABDOMINAL HYSTERECTOMY    . arterial thrombi stents in both legs    . BACK SURGERY    . COLONOSCOPY    . ESOPHAGOGASTRODUODENOSCOPY (EGD) WITH PROPOFOL N/A 06/19/2016   Procedure: ESOPHAGOGASTRODUODENOSCOPY (EGD) WITH PROPOFOL;  Surgeon: Lollie Sails, MD;  Location: [facility] ENDOSCOPY;  Service: Endoscopy;  Laterality: N/A;  . EYE SURGERY     cataract extraction    Family History  Problem Relation Age of Onset  . Cancer Father   . Stroke Father    Social  History:  reports that she has never smoked. She has never used smokeless tobacco. She reports that she does not drink alcohol. Her drug history is not on file.  Allergies: No Known Allergies  Medications:  I have reviewed the patient's current medications. Prior to Admission:  Prescriptions Prior to Admission  Medication Sig Dispense Refill Last Dose  . allopurinol (ZYLOPRIM) 100 MG tablet Take 100 mg by mouth daily.   07/20/2016 at 0900  . amitriptyline (ELAVIL) 25 MG tablet Take 25 mg by mouth at bedtime.   07/19/2016 at 2100  . Cholecalciferol (VITAMIN D-3) 1000 UNITS CAPS Take 2,000 Units by mouth daily.    07/20/2016 at 0800  . clopidogrel (PLAVIX) 75 MG tablet Take 75 mg by mouth daily with breakfast.   07/20/2016 at 0900  . DULoxetine (CYMBALTA) 60 MG capsule Take 60 mg by mouth daily.   07/20/2016 at 0900  . furosemide (LASIX) 20 MG tablet Take 20 mg by mouth daily.    07/20/2016 at 0900  . gabapentin (NEURONTIN) 100 MG capsule Take 100 mg by mouth 3 (three) times daily. Take 1 cap (100mg ) in morning, 3 caps (300mg ) at 1400 and 2100   07/20/2016 at 0900  . levothyroxine (SYNTHROID, LEVOTHROID) 25 MCG tablet Take 25 mcg by mouth daily before breakfast.   07/20/2016 at 0700  . metoprolol succinate (TOPROL-XL) 25 MG 24 hr tablet Take  25 mg by mouth daily.   07/20/2016 at 0900  . montelukast (SINGULAIR) 10 MG tablet Take 10 mg by mouth daily.   07/19/2016 at 2100  . oxyCODONE (OXY IR/ROXICODONE) 5 MG immediate release tablet Take 5 mg by mouth every 6 (six) hours as needed for severe pain.    prn at prn  . pantoprazole (PROTONIX) 40 MG tablet Take 40 mg by mouth daily.   07/20/2016 at 0700  . Probiotic Product (RISA-BID PROBIOTIC) TABS Take 1 tablet by mouth daily.   07/20/2016 at 0900  . Sennosides-Docusate Sodium (SENNA) 8.6-50 MG TABS Take 1 tablet by mouth every three (3) days as needed (for no bowel movement (constipation)).    prn at prn  . topiramate (TOPAMAX) 25 MG tablet Take 25 mg by mouth  at bedtime.   07/19/2016 at 2100  . traMADol (ULTRAM) 50 MG tablet Take 50 mg by mouth every 8 (eight) hours as needed (for pain control).    prn at prn  . ursodiol (ACTIGALL) 300 MG capsule Take 300 mg by mouth 2 (two) times daily.   07/20/2016 at 0900  . vitamin B-12 (CYANOCOBALAMIN) 1000 MCG tablet Take 1,000 mcg by mouth daily.   07/20/2016 at 0900   Scheduled: . allopurinol  100 mg Oral Daily  . amitriptyline  25 mg Oral QHS  . aspirin EC  81 mg Oral Daily  . clopidogrel  75 mg Oral Q breakfast  . docusate sodium  100 mg Oral BID  . DULoxetine  60 mg Oral Daily  . enoxaparin (LOVENOX) injection  40 mg Subcutaneous Q24H  . gabapentin  100 mg Oral [id number]   And  . gabapentin  300 mg Oral BID  . lactobacillus acidophilus  1 tablet Oral Daily  . levothyroxine  25 mcg Oral QAC breakfast  . metoprolol succinate  25 mg Oral Daily  . montelukast  10 mg Oral QHS  . pantoprazole  40 mg Oral Daily  . sodium chloride flush  3 mL Intravenous Q12H  . topiramate  25 mg Oral QHS  . ursodiol  300 mg Oral BID    ROS: History obtained from the patient  General ROS: negative for - chills, fatigue, fever, night sweats, weight gain or weight loss Psychological ROS: negative for - behavioral disorder, hallucinations, memory difficulties, mood swings or suicidal ideation Ophthalmic ROS: negative for - blurry vision, double vision, eye pain or loss of vision ENT ROS: negative for - epistaxis, nasal discharge, oral lesions, sore throat, tinnitus or vertigo Allergy and Immunology ROS: negative for - hives or itchy/watery eyes Hematological and Lymphatic ROS: negative for - bleeding problems, bruising or swollen lymph nodes Endocrine ROS: negative for - galactorrhea, hair pattern changes, polydipsia/polyuria or temperature intolerance Respiratory ROS: negative for - cough, hemoptysis, shortness of breath or wheezing Cardiovascular ROS: negative for - chest pain, dyspnea on exertion, edema or irregular  heartbeat Gastrointestinal ROS: negative for - abdominal pain, diarrhea, hematemesis, nausea/vomiting or stool incontinence Genito-Urinary ROS: negative for - dysuria, hematuria, incontinence or urinary frequency/urgency Musculoskeletal ROS: negative for - joint swelling or muscular weakness Neurological ROS: as noted in HPI Dermatological ROS: negative for rash and skin lesion changes  Physical Examination: Blood pressure (!) 128/41, pulse (!) 55, temperature 97.6 F (36.4 C), temperature source Oral, resp. rate 20, height 5\' 5"  (1.651 m), weight 88.2 kg (194 lb 6.4 oz), SpO2 (!) 84 %.  HEENT-  Normocephalic, no lesions, without obvious abnormality.  Normal external eye and conjunctiva.  Normal  TM's bilaterally.  Normal auditory canals and external ears. Normal external nose, mucus membranes and septum.  Normal pharynx. Cardiovascular- S1, S2 normal, pulses palpable throughout   Lungs- chest clear, no wheezing, rales, normal symmetric air entry Abdomen- soft, non-tender; bowel sounds normal; no masses,  no organomegaly Extremities- no edema Lymph-no adenopathy palpable Musculoskeletal-no joint tenderness, deformity or swelling Skin-warm and dry, no hyperpigmentation, vitiligo, or suspicious lesions  Neurological Examination Mental Status: Alert, oriented, thought content appropriate.  Word finding difficulties.  Able to follow 3 step commands without difficulty. Cranial Nerves: II: Discs flat bilaterally; Visual fields grossly normal, pupils equal, round, reactive to light and accommodation III,IV, VI: ptosis not present, extra-ocular motions intact bilaterally V,VII: right facial droop, facial light touch sensation decreased on the right VIII: hearing normal bilaterally IX,X: gag reflex reduced XI: bilateral shoulder shrug XII: midline tongue extension Motor: Right : Upper extremity   3/5    Left:     Upper extremity   5/5  Lower extremity   3/5     Lower extremity   5/5 Tone and  bulk:normal tone throughout; no atrophy noted.  Movements bradykinetic. Sensory: Pinprick and light touch decreased on the right Deep Tendon Reflexes: 2+ and symmetric with absent AJ's bilaterally Plantars: Right: upgoing   Left: upgoing Cerebellar: Normal finger-to-nose and normal heel-to-shin testing Gait: not tested due to safety concerns     Laboratory Studies:  Basic Metabolic Panel:  Recent Labs Lab 07/20/16 1345  NA 138  K 4.3  CL 104  CO2 27  GLUCOSE 90  BUN 17  CREATININE 1.36*  CALCIUM 9.0    Liver Function Tests:  Recent Labs Lab 07/20/16 1345  AST 16  ALT 7*  ALKPHOS 118  BILITOT 0.3  PROT 7.3  ALBUMIN 3.1*   No results for input(s): LIPASE, AMYLASE in the last 168 hours. No results for input(s): AMMONIA in the last 168 hours.  CBC:  Recent Labs Lab 07/20/16 1345  WBC 9.2  NEUTROABS 6.8*  HGB 12.0  HCT 36.2  MCV 94.3  PLT 228    Cardiac Enzymes:  Recent Labs Lab 07/20/16 1345  TROPONINI <0.03    BNP: Invalid input(s): POCBNP  CBG: No results for input(s): GLUCAP in the last 168 hours.  Microbiology: Results for orders placed or performed during the hospital encounter of 07/20/16  MRSA PCR Screening     Status: None   Collection Time: 07/20/16  6:44 PM  Result Value Ref Range Status   MRSA by PCR NEGATIVE NEGATIVE Final    Comment:        The GeneXpert MRSA Assay (FDA approved for NASAL specimens only), is one component of a comprehensive MRSA colonization surveillance program. It is not intended to diagnose MRSA infection nor to guide or monitor treatment for MRSA infections.     Coagulation Studies:  Recent Labs  07/20/16 1345  LABPROT 14.5  INR 1.12    Urinalysis:  Recent Labs Lab 07/20/16 1750  COLORURINE STRAW*  LABSPEC 1.005  PHURINE 6.0  GLUCOSEU NEGATIVE  HGBUR NEGATIVE  BILIRUBINUR NEGATIVE  KETONESUR NEGATIVE  PROTEINUR NEGATIVE  NITRITE NEGATIVE  LEUKOCYTESUR 1+*    Lipid Panel:     Component Value Date/Time   CHOL 153 07/21/2016 0442   TRIG 67 07/21/2016 0442   HDL 43 07/21/2016 0442   CHOLHDL 3.6 07/21/2016 0442   VLDL 13 07/21/2016 0442   LDLCALC 97 07/21/2016 0442    HgbA1C:  Lab Results  Component Value Date  HGBA1C 5.6 09/15/2014    Urine Drug Screen:     Component Value Date/Time   LABOPIA NONE DETECTED 07/20/2016 1750   COCAINSCRNUR NONE DETECTED 07/20/2016 1750   LABBENZ NONE DETECTED 07/20/2016 1750   AMPHETMU NONE DETECTED 07/20/2016 1750   THCU NONE DETECTED 07/20/2016 1750   LABBARB NONE DETECTED 07/20/2016 1750    Alcohol Level: No results for input(s): ETH in the last 168 hours.   Imaging: Ct Head Wo Contrast  Result Date: 07/20/2016 CLINICAL DATA:  Right-sided weakness. EXAM: CT HEAD WITHOUT CONTRAST TECHNIQUE: Contiguous axial images were obtained from the base of the skull through the vertex without intravenous contrast. COMPARISON:  None. FINDINGS: Brain: No subdural, epidural, or subarachnoid hemorrhage. Ventricles and sulci are stable. Multiple infarcts in the cerebellar hemispheres are stable. Possible lacunar infarcts in the brainstem as well. Basal cisterns are patent. White matter changes are stable. No mass, mass effect, or midline shift. No acute cortical ischemia or infarct. Vascular: Calcified atherosclerotic changes seen in the intracranial carotid arteries. Skull: Normal. Negative for fracture or focal lesion. Sinuses/Orbits: No acute finding. Other: None. IMPRESSION: No acute intracranial abnormality identified. Chronic changes as above. Electronically Signed   By: Dorise Bullion III M.D   On: 07/20/2016 13:20    Assessment: 82 y.o. female presenting with right sided weakness.  Head CT personally reviewed and shows no acute changes.  Acute infarct suspected.  Patient on Plavix as an outpatient.  LDL 97.  Further work up recommended.    Stroke Risk Factors - hyperlipidemia and hypertension  Plan: 1. HgbA1c 2. MRI, MRA  of  the brain without contrast 3. PT consult, OT consult, Speech consult 4. Echocardiogram 5. Carotid dopplers 6. Prophylactic therapy-ASA 81mg  to be added to Plavix daily 7. NPO until RN stroke swallow screen 8. Telemetry monitoring 9. Frequent neuro checks 10. Statin for lipid management with target LDL<70.   Alexis Goodell, MD Neurology [phone] 07/21/2016, 11:10 AM

## 2016-07-22 ENCOUNTER — Ambulatory Visit: Admission: RE | Admit: 2016-07-22 | Payer: Medicare Other | Source: Ambulatory Visit | Admitting: Vascular Surgery

## 2016-07-22 ENCOUNTER — Encounter
Admission: EM | Disposition: A | Discharge status: Skilled Nursing Facility | Payer: Self-pay | Source: Home / Self Care | Attending: Internal Medicine

## 2016-07-22 LAB — ECHOCARDIOGRAM COMPLETE
E decel time: 271 msec
EERAT: 11.35
FS: 31 % (ref 28–44)
HEIGHTINCHES: 65 in
IVS/LV PW RATIO, ED: 1.12
LA diam end sys: 50 mm
LA diam index: 2.45 cm/m2
LA vol A4C: 66.4 ml
LA vol index: 33.6 mL/m2
LASIZE: 50 mm
LAVOL: 68.6 mL
LV E/e' medial: 11.35
LV E/e'average: 11.35
LV e' LATERAL: 8.25 cm/s
MV Dec: 271
MVPG: 4 mmHg
MVPKAVEL: 65 m/s
MVPKEVEL: 93.6 m/s
P 1/2 time: 503 ms
PW: 10.2 mm — AB (ref 0.6–1.1)
TAPSE: 25.6 mm
TDI e' lateral: 8.25
TDI e' medial: 6.05
WEIGHTICAEL: 3110.4 [oz_av]

## 2016-07-22 LAB — HEMOGLOBIN A1C
Hgb A1c MFr Bld: 5.4 % (ref 4.8–5.6)
MEAN PLASMA GLUCOSE: 108 mg/dL

## 2016-07-22 SURGERY — LOWER EXTREMITY ANGIOGRAPHY
Anesthesia: Moderate Sedation | Site: Leg Lower | Laterality: Right

## 2016-07-22 MED ORDER — HYDROCODONE-ACETAMINOPHEN 5-325 MG PO TABS: 1.0000 | ORAL_TABLET | Freq: Four times a day (QID) | ORAL | 0 refills | Status: DC | PRN

## 2016-07-22 MED ORDER — HYDROCODONE-ACETAMINOPHEN 5-325 MG PO TABS
1.0000 | ORAL_TABLET | Freq: Four times a day (QID) | ORAL | Status: DC | PRN
  Administered 2016-07-22: 09:00:00 1 via ORAL
  Filled 2016-07-22: qty 1

## 2016-07-22 MED ORDER — ASPIRIN 81 MG PO TBEC: 81.0000 mg | DELAYED_RELEASE_TABLET | Freq: Every day | ORAL | 0 refills | Status: AC

## 2016-07-22 MED ORDER — ATORVASTATIN CALCIUM 40 MG PO TABS: 40.0000 mg | ORAL_TABLET | Freq: Every day | ORAL | 1 refills | Status: DC

## 2016-07-22 MED ORDER — FUROSEMIDE 20 MG PO TABS: 20.0000 mg | ORAL_TABLET | ORAL | 0 refills | Status: DC

## 2016-07-22 NOTE — Progress Notes (Addendum)
EMS came to transport Patient to [place] in [place]. Patient was alert and oriented. Vital signs were stable. Report and discharge documentation handed over to EMS staff. Needs attended.  Son Ashok Pall was informed via phone about the transfer.

## 2016-07-22 NOTE — Progress Notes (Signed)
Pt for discharge to  [place].  No distress.  Complete bath  This pm per sherry nt.  Pt had  2 large bms this  Pm.  Sl d/cd. Pt  To transport via ems. They have been called and report called earlier to  [facility] at [place].

## 2016-07-22 NOTE — Discharge Instructions (Signed)
Discharge Diet Recommendations:  Dysphagia 1 w/ Nectar liquids; Meds in Puree - whole as able. Yogurt TID meals. Magic Cup and/or [employer] for supplements.  Aspiration precautions.   Speech Therapy to follow for tx and upgrade of diet.

## 2016-07-22 NOTE — Clinical Social Work Note (Signed)
Patient to be d/c'ed today to [place].  Patient and family agreeable to plans will transport via ems RN to call report to Room E6 [phone].  Evette Cristal, MSW Mon-Fri 8a-4:30p [phone]

## 2016-07-22 NOTE — Evaluation (Signed)
Occupational Therapy Evaluation Patient Details Name: [name] MRN: [id number] DOB: Feb 14, 1928 Today's Date: 07/22/2016    History of Present Illness presented to ER secondary to acute onset R sided weakness/numbness; admitted with acute CVA.  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   Clinical Impression   Pt is 82 year old female who presents with impaired vision (see below for full assessment), decreased coordination with gross and fine motor skills of RUEs and hands and pain 4/10 in bilateral feet. Pt has decreased opposition with increased extension in fingers with weakness in grasp and UEs and AROM limited to 90 degrees due to weakness.  Pt was not able to feed self but with built up foam handles, pt was able to scoop apple sauce and bring to mouth x2. Pt has 3/5 strength in RUE and hand and 4-/5 in LUE and hand.  Pt demonstrated significant impairments in line bisection test and was able to hold pen in R hand with assist.  Proprioception and light touch are impaired in RUE as well.  Mod assist required to complete self feeding, grooming and UB dressing and total assist for LB dressing skills due to decreased trunk control, pain in feet and decreased endurance and tolerance for functional activities.  Pt would benefit from skilled OT services to address ADL training, fine motor skills training, vision training, adaptive equipment training, strengthening, and family ed and training.  Pt would benefit from SNF for continued rehab after discharge from hospital.    Follow Up Recommendations  SNF    Equipment Recommendations       Recommendations for Other Services       Precautions / Restrictions Precautions Precautions: Fall Precaution Comments: Nectar/pureed diet Restrictions Weight Bearing Restrictions: No      Mobility Bed Mobility                  Transfers                      Balance                                             ADL Overall ADL's : Needs assistance/impaired Eating/Feeding: Set up;Maximal assistance Eating/Feeding Details (indicate cue type and reason): Pt given foam utensil holder for self feeding using R hand Grooming: Wash/dry hands;Wash/dry face;Set up;Brushing hair;Sitting;Minimal assistance           Upper Body Dressing : Set up;Moderate assistance;Sitting   Lower Body Dressing: Set up;Maximal assistance                       Vision Vision Assessment?: Yes Eye Alignment: Impaired (comment) Ocular Range of Motion: Restricted looking up;Restricted on the right;Restricted on the left Alignment/Gaze Preference: Within Defined Limits Tracking/Visual Pursuits: Right eye does not track laterally;Left eye does not track laterally;Decreased smoothness of horizontal tracking;Requires cues, head turns, or add eye shifts to track Saccades: Decreased speed of saccadic movement;Additional eye shifts occurred during testing Convergence: Impaired (comment) Visual Fields: Right homonymous hemianopsia Depth Perception: Undershoots   Perception     Praxis Praxis Praxis tested?: Not tested    Pertinent Vitals/Pain Pain Assessment: Faces Faces Pain Scale: Hurts little more Pain Location: Bilat feet Pain Descriptors / Indicators: Aching Pain Intervention(s): Limited activity within patient's tolerance;Monitored during session;Premedicated before session  Hand Dominance Right   Extremity/Trunk Assessment Upper Extremity Assessment Upper Extremity Assessment: RUE deficits/detail RUE Deficits / Details: Pt with active movement in R UE and hand but impaired sensation and gross and fine motor control.   RUE Sensation: decreased light touch;decreased proprioception RUE Coordination: decreased fine motor;decreased gross motor   Lower Extremity Assessment Lower Extremity Assessment: Defer to PT evaluation       Communication     Cognition  Arousal/Alertness: Awake/alert Behavior During Therapy: WFL for tasks assessed/performed Overall Cognitive Status: Within Functional Limits for tasks assessed                     General Comments       Exercises       Shoulder Instructions      Home Living Family/patient expects to be discharged to:: Assisted living                             Home Equipment: Walker - 4 wheels;Cane - single point          Prior Functioning/Environment          Comments: Assist from facility staff for ADLs; mod indep with household distances and amb to/from dining hall (for all meals).  Enjoys Bingo at facility.        OT Problem List: Decreased strength;Decreased activity tolerance;Impaired vision/perception;Pain;Decreased knowledge of precautions;Impaired balance (sitting and/or standing);Decreased range of motion;Decreased coordination;Impaired UE functional use;Decreased knowledge of use of DME or AE   OT Treatment/Interventions: Self-care/ADL training;Balance training;Patient/family education;Therapeutic activities;DME and/or AE instruction;Therapeutic exercise;Visual/perceptual remediation/compensation;Neuromuscular education    OT Goals(Current goals can be found in the care plan section) Acute Rehab OT Goals Patient Stated Goal: to go back to [facility] OT Goal Formulation: With patient Time For Goal Achievement: 08/05/16 Potential to Achieve Goals: Good ADL Goals Pt Will Perform Eating: with adaptive utensils;with set-up;with min assist;sitting Pt Will Perform Grooming: with set-up;with adaptive equipment;sitting (using R hand) Pt Will Perform Upper Body Dressing: with set-up;with min assist;sitting Pt Will Perform Lower Body Dressing: with set-up;with mod assist;sit to/from stand Pt/caregiver will Perform Home Exercise Program: Increased ROM;Increased strength;Right Upper extremity;With written HEP provided;With theraputty  OT Frequency: Min 1X/week    Barriers to D/C:            Co-evaluation              End of Session Nurse Communication:  (given foam utensil holders)  Activity Tolerance: Patient tolerated treatment well Patient left: in bed;with call bell/phone within reach;with bed alarm set   Time: 1020-1058 OT Time Calculation (min): 38 min Charges:  OT General Charges $OT Visit: 1 Procedure OT Evaluation $OT Eval Moderate Complexity: 1 Procedure OT Treatments $Self Care/Home Management : 8-22 mins $Neuromuscular Re-education: 8-22 mins G-Codes:    Chrys Racer, OTR/L ascom [phone] 07/22/16, 1:05 PM

## 2016-07-22 NOTE — Care Management Important Message (Signed)
Important Message  Patient Details  Name: [name] MRN: [id number] Date of Birth: [birth year]   Medicare Important Message Given:  Yes    Shelbie Ammons, RN 07/22/2016, 9:56 AM

## 2016-07-22 NOTE — Telephone Encounter (Signed)
[facility] is aware.

## 2016-07-22 NOTE — Plan of Care (Signed)
Problem: SLP Dysphagia Goals Goal: Misc Dysphagia Goal Pt will safely tolerate po diet of least restrictive consistency w/ no overt s/s of aspiration noted by Staff/pt/family x3 sessions.    

## 2016-07-22 NOTE — Discharge Summary (Addendum)
[facility] NAME: [name]    MR#:  [id number]  DATE OF BIRTH:  May 28, 1928  DATE OF ADMISSION:  07/20/2016 ADMITTING PHYSICIAN: Hillary Bow, MD  DATE OF DISCHARGE: 07/22/16  PRIMARY CARE PHYSICIAN: Rosaland Lao, NP    ADMISSION DIAGNOSIS:  Cerebrovascular accident (CVA), unspecified mechanism ([place]) [I63.9]  DISCHARGE DIAGNOSIS:  Acute left pontine CVA H/o PAD (pt needs to be rescheduled for angiogram LE as outpt)  SECONDARY DIAGNOSIS:   Past Medical History:  Diagnosis Date  . Anxiety   . Arthritis   . Asthma   . Calculus of gallbladder without mention of cholecystitis, with obstruction   . Candidiasis of the esophagus   . Chronic kidney disease, stage III (moderate)   . Chronic pain syndrome   . Corns and callosities   . Depression   . Emphysema lung ([place])   . Esophageal reflux   . Essential hypertension, benign   . Gout, unspecified   . Hypertension   . Hypoglycemia, unspecified   . Hypopotassemia   . Migraine, unspecified, without mention of intractable migraine without mention of status migrainosus   . Other vitamin B12 deficiency anemia   . Personal history of fall   . Unspecified asthma(493.90)   . Unspecified constipation   . Unspecified hypertensive heart disease with heart failure(402.91)   . Unspecified vitamin D deficiency     HOSPITAL COURSE:  DeloisJohnsonis a 82 y.o.femalewith a known history of HTN, COPD, Arthritis who walks with a walker at baseline noticed significant right weakness and numbness yesterday. She waited hoping symptoms would improve. Today due to persistent right-sided weakness and numbness patient presented to the emergency room. She also has slurred speech but no dysphagia. No change in vision.  * Acute CVA with right hemiparesis and numbness - MRI of the brain shows left acute pontine non hemorrhagic inract -Carotid dopplers no acute stenosis - Echo  done-pending results - cont aspirin, plavix, and statin. - Lovenox for DVT prophylaxis. - PT recommends SNF-CSW consulted - improving with right UE weakness - Consult neurology appreciated - Fall precautions  * Hypertension Resumed bp meds   *h/o PAD  pt Was scheduled to get angiogram lower extremity tomorrow. Procedures canceled since patient is in the hospital getting workup for stroke. Pt will get procedure done in next few weeks-son aware and to call vascular sx office for it  Discussed with patient and son Mr walker over the phone.  D/c to rehab  Discharge Diet Recommendations:  Dysphagia 1 w/ Nectar liquids; Meds in Puree - whole as able. Yogurt TID meals. Magic [employer] and/or [employer] for supplements.  Aspiration precautions.   Speech Therapy to follow for tx and upgrade of diet.   CONSULTS OBTAINED:  Treatment Team:  Alexis Goodell, MD  DRUG ALLERGIES:  No Known Allergies  DISCHARGE MEDICATIONS:   Current Discharge Medication List    START taking these medications   Details  aspirin EC 81 MG EC tablet Take 1 tablet (81 mg total) by mouth daily. Qty: 30 tablet, Refills: 0    atorvastatin (LIPITOR) 40 MG tablet Take 1 tablet (40 mg total) by mouth daily at 6 PM. Qty: 30 tablet, Refills: 1    HYDROcodone-acetaminophen (NORCO/VICODIN) 5-325 MG tablet Take 1 tablet by mouth every 6 (six) hours as needed for moderate pain. Qty: 30 tablet, Refills: 0      CONTINUE these medications which have CHANGED   Details  furosemide (LASIX)  20 MG tablet Take 1 tablet (20 mg total) by mouth every other day. Qty: 30 tablet, Refills: 0      CONTINUE these medications which have NOT CHANGED   Details  allopurinol (ZYLOPRIM) 100 MG tablet Take 100 mg by mouth daily.    amitriptyline (ELAVIL) 25 MG tablet Take 25 mg by mouth at bedtime.    Cholecalciferol (VITAMIN D-3) 1000 UNITS CAPS Take 2,000 Units by mouth daily.     clopidogrel (PLAVIX) 75 MG tablet Take 75 mg  by mouth daily with breakfast.    DULoxetine (CYMBALTA) 60 MG capsule Take 60 mg by mouth daily.    gabapentin (NEURONTIN) 100 MG capsule Take 100 mg by mouth 3 (three) times daily. Take 1 cap (100mg ) in morning, 3 caps (300mg ) at 1400 and 2100    levothyroxine (SYNTHROID, LEVOTHROID) 25 MCG tablet Take 25 mcg by mouth daily before breakfast.    metoprolol succinate (TOPROL-XL) 25 MG 24 hr tablet Take 25 mg by mouth daily.    montelukast (SINGULAIR) 10 MG tablet Take 10 mg by mouth daily.    pantoprazole (PROTONIX) 40 MG tablet Take 40 mg by mouth daily.    Probiotic Product (RISA-BID PROBIOTIC) TABS Take 1 tablet by mouth daily.    Sennosides-Docusate Sodium (SENNA) 8.6-50 MG TABS Take 1 tablet by mouth every three (3) days as needed (for no bowel movement (constipation)).     topiramate (TOPAMAX) 25 MG tablet Take 25 mg by mouth at bedtime.    traMADol (ULTRAM) 50 MG tablet Take 50 mg by mouth every 8 (eight) hours as needed (for pain control).     ursodiol (ACTIGALL) 300 MG capsule Take 300 mg by mouth 2 (two) times daily.    vitamin B-12 (CYANOCOBALAMIN) 1000 MCG tablet Take 1,000 mcg by mouth daily.      STOP taking these medications     oxyCODONE (OXY IR/ROXICODONE) 5 MG immediate release tablet         If you experience worsening of your admission symptoms, develop shortness of breath, life threatening emergency, suicidal or homicidal thoughts you must seek medical attention immediately by calling 911 or calling your MD immediately  if symptoms less severe.  You Must read complete instructions/literature along with all the possible adverse reactions/side effects for all the Medicines you take and that have been prescribed to you. Take any new Medicines after you have completely understood and accept all the possible adverse reactions/side effects.   Please note  You were cared for by a hospitalist during your hospital stay. If you have any questions about your discharge  medications or the care you received while you were in the hospital after you are discharged, you can call the unit and asked to speak with the hospitalist on call if the hospitalist that took care of you is not available. Once you are discharged, your primary care physician will handle any further medical issues. Please note that NO REFILLS for any discharge medications will be authorized once you are discharged, as it is imperative that you return to your primary care physician (or establish a relationship with a primary care physician if you do not have one) for your aftercare needs so that they can reassess your need for medications and monitor your lab values. Today   SUBJECTIVE   Bilateral foot pain  VITAL SIGNS:  Blood pressure (!) 116/49, pulse 63, temperature 98.2 F (36.8 C), temperature source Oral, resp. rate 17, height 5\' 5"  (1.651 m), weight 88.2 kg (  194 lb 6.4 oz), SpO2 95 %.  I/O:   Intake/Output Summary (Last 24 hours) at 07/22/16 0749 Last data filed at 07/22/16 0500  Gross per 24 hour  Intake              180 ml  Output                0 ml  Net              180 ml    PHYSICAL EXAMINATION:  GENERAL:  82 y.o.-year-old patient lying in the bed with no acute distress.  EYES: Pupils equal, round, reactive to light and accommodation. No scleral icterus. Extraocular muscles intact.  HEENT: Head atraumatic, normocephalic. Oropharynx and nasopharynx clear.  NECK:  Supple, no jugular venous distention. No thyroid enlargement, no tenderness.  LUNGS: Normal breath sounds bilaterally, no wheezing, rales,rhonchi or crepitation. No use of accessory muscles of respiration.  CARDIOVASCULAR: S1, S2 normal. No murmurs, rubs, or gallops.  ABDOMEN: Soft, non-tender, non-distended. Bowel sounds present. No organomegaly or mass.  EXTREMITIES: No pedal edema, cyanosis, or clubbing.  NEUROLOGIC: Cranial nerves II through XII are intact. Muscle strength 4/5 in all extremities. Sensation  intact. Gait not checked. Right sided mild weakness PSYCHIATRIC: The patient is alert and oriented x 3.  SKIN: No obvious rash, lesion, or ulcer. Corn and callouses  DATA REVIEW:   CBC   Recent Labs Lab 07/20/16 1345  WBC 9.2  HGB 12.0  HCT 36.2  PLT 228    Chemistries   Recent Labs Lab 07/20/16 1345  NA 138  K 4.3  CL 104  CO2 27  GLUCOSE 90  BUN 17  CREATININE 1.36*  CALCIUM 9.0  AST 16  ALT 7*  ALKPHOS 118  BILITOT 0.3    Microbiology Results   Recent Results (from the past 240 hour(s))  MRSA PCR Screening     Status: None   Collection Time: 07/20/16  6:44 PM  Result Value Ref Range Status   MRSA by PCR NEGATIVE NEGATIVE Final    Comment:        The GeneXpert MRSA Assay (FDA approved for NASAL specimens only), is one component of a comprehensive MRSA colonization surveillance program. It is not intended to diagnose MRSA infection nor to guide or monitor treatment for MRSA infections.     RADIOLOGY:  Ct Head Wo Contrast  Result Date: 07/20/2016 CLINICAL DATA:  Right-sided weakness. EXAM: CT HEAD WITHOUT CONTRAST TECHNIQUE: Contiguous axial images were obtained from the base of the skull through the vertex without intravenous contrast. COMPARISON:  None. FINDINGS: Brain: No subdural, epidural, or subarachnoid hemorrhage. Ventricles and sulci are stable. Multiple infarcts in the cerebellar hemispheres are stable. Possible lacunar infarcts in the brainstem as well. Basal cisterns are patent. White matter changes are stable. No mass, mass effect, or midline shift. No acute cortical ischemia or infarct. Vascular: Calcified atherosclerotic changes seen in the intracranial carotid arteries. Skull: Normal. Negative for fracture or focal lesion. Sinuses/Orbits: No acute finding. Other: None. IMPRESSION: No acute intracranial abnormality identified. Chronic changes as above. Electronically Signed   By: Dorise Bullion III M.D   On: 07/20/2016 13:20   Mr Jodene Nam Head  Wo Contrast  Result Date: 07/21/2016 CLINICAL DATA:  New onset right-sided weakness beginning 2 days ago. Symptoms were preceded by dizziness. EXAM: MRI HEAD WITHOUT CONTRAST MRA HEAD WITHOUT CONTRAST TECHNIQUE: Multiplanar, multiecho pulse sequences of the brain and surrounding structures were obtained without intravenous contrast. Angiographic images of  the head were obtained using MRA technique without contrast. COMPARISON:  CT head without contrast 07/20/2016 FINDINGS: MRI HEAD FINDINGS Brain: The diffusion-weighted images demonstrate acute/ subacute nonhemorrhagic left pontine infarct measuring 16 mm in maximal dimension. No acute supratentorial infarct is present. T2 changes are associated with the infarct compatible with the subacute time frame. Remote infarcts are present in the inferior cerebellum bilaterally. Moderate periventricular and subcortical T2 changes are present bilaterally. A remote lacunar infarct is present in the left lentiform nucleus. Remote lacunar infarcts are present in the left thalamus. No acute hemorrhage or mass lesion is present. The ventricles are of normal size. No significant extra-axial fluid collection is present. Vascular: The flow is present in the major intracranial arteries. Skull and upper cervical spine: The skullbase is within normal limits. Midline intracranial sagittal structures are normal. Degenerative changes are present in the cervical spine with central canal narrowing at C3-4. Marrow signal is normal. Sinuses/Orbits: Mild mucosal thickening is present along the floor of the right maxillary sinus. The paranasal sinuses and mastoid air cells are otherwise clear. Bilateral lens replacements are noted. The globes and orbits are otherwise intact. MRA HEAD FINDINGS Moderate atherosclerotic irregularity is present within the cavernous internal carotid arteries bilaterally without a significant stenosis relative to the more distal vessel. There is mild narrowing of the  distal right A1 segment. The left A1 segment is normal. Atherosclerotic irregularity is present in the M1 segments bilaterally without a significant stenosis. The MCA bifurcations are intact. There is moderate attenuation of MCA branch vessels, left greater than right. Next The left vertebral artery is dominant. Atherosclerotic irregularity is present in the distal vertebral arteries bilaterally. PICA origins are visualized and normal. The vertebrobasilar junction is normal. The left posterior cerebral artery originates from the basilar tip. The right posterior cerebral artery is of fetal type. There is moderate attenuation of distal PCA branch vessels bilaterally. IMPRESSION: 1. Acute/subacute nonhemorrhagic left pontine infarct. 2. Remote bilateral posterior cerebellar infarcts. 3. Moderate atrophy and white matter disease compatible with chronic microvascular ischemia. 4. MRA circle of Willis demonstrate significant medium and distal small vessel disease without a significant proximal stenosis or aneurysm. 5. Atherosclerotic changes within the cavernous internal carotid arteries and right greater than left A1 segments without a significant stenosis. 6. Minimal right maxillary sinus disease. Electronically Signed   By: San Morelle M.D.   On: 07/21/2016 14:36   Mr Brain Wo Contrast  Result Date: 07/21/2016 CLINICAL DATA:  New onset right-sided weakness beginning 2 days ago. Symptoms were preceded by dizziness. EXAM: MRI HEAD WITHOUT CONTRAST MRA HEAD WITHOUT CONTRAST TECHNIQUE: Multiplanar, multiecho pulse sequences of the brain and surrounding structures were obtained without intravenous contrast. Angiographic images of the head were obtained using MRA technique without contrast. COMPARISON:  CT head without contrast 07/20/2016 FINDINGS: MRI HEAD FINDINGS Brain: The diffusion-weighted images demonstrate acute/ subacute nonhemorrhagic left pontine infarct measuring 16 mm in maximal dimension. No acute  supratentorial infarct is present. T2 changes are associated with the infarct compatible with the subacute time frame. Remote infarcts are present in the inferior cerebellum bilaterally. Moderate periventricular and subcortical T2 changes are present bilaterally. A remote lacunar infarct is present in the left lentiform nucleus. Remote lacunar infarcts are present in the left thalamus. No acute hemorrhage or mass lesion is present. The ventricles are of normal size. No significant extra-axial fluid collection is present. Vascular: The flow is present in the major intracranial arteries. Skull and upper cervical spine: The skullbase is within normal  limits. Midline intracranial sagittal structures are normal. Degenerative changes are present in the cervical spine with central canal narrowing at C3-4. Marrow signal is normal. Sinuses/Orbits: Mild mucosal thickening is present along the floor of the right maxillary sinus. The paranasal sinuses and mastoid air cells are otherwise clear. Bilateral lens replacements are noted. The globes and orbits are otherwise intact. MRA HEAD FINDINGS Moderate atherosclerotic irregularity is present within the cavernous internal carotid arteries bilaterally without a significant stenosis relative to the more distal vessel. There is mild narrowing of the distal right A1 segment. The left A1 segment is normal. Atherosclerotic irregularity is present in the M1 segments bilaterally without a significant stenosis. The MCA bifurcations are intact. There is moderate attenuation of MCA branch vessels, left greater than right. Next The left vertebral artery is dominant. Atherosclerotic irregularity is present in the distal vertebral arteries bilaterally. PICA origins are visualized and normal. The vertebrobasilar junction is normal. The left posterior cerebral artery originates from the basilar tip. The right posterior cerebral artery is of fetal type. There is moderate attenuation of distal PCA  branch vessels bilaterally. IMPRESSION: 1. Acute/subacute nonhemorrhagic left pontine infarct. 2. Remote bilateral posterior cerebellar infarcts. 3. Moderate atrophy and white matter disease compatible with chronic microvascular ischemia. 4. MRA circle of Willis demonstrate significant medium and distal small vessel disease without a significant proximal stenosis or aneurysm. 5. Atherosclerotic changes within the cavernous internal carotid arteries and right greater than left A1 segments without a significant stenosis. 6. Minimal right maxillary sinus disease. Electronically Signed   By: San Morelle M.D.   On: 07/21/2016 14:36   US Carotid Bilateral  Result Date: 07/21/2016 CLINICAL DATA:  Stroke. EXAM: BILATERAL CAROTID DUPLEX ULTRASOUND TECHNIQUE: Pearline Cables scale imaging, color Doppler and duplex ultrasound were performed of bilateral carotid and vertebral arteries in the neck. COMPARISON:  MRI 07/21/2016. FINDINGS: Criteria: Quantification of carotid stenosis is based on velocity parameters that correlate the residual internal carotid diameter with NASCET-based stenosis levels, using the diameter of the distal internal carotid lumen as the denominator for stenosis measurement. The following velocity measurements were obtained: RIGHT ICA:  80/13 cm/sec CCA:  42/70 cm/sec SYSTOLIC ICA/CCA RATIO:  1.1 DIASTOLIC ICA/CCA RATIO:  1.3 ECA:  6 cm/sec LEFT ICA:  51/15 cm/sec CCA:  62/3 cm/sec SYSTOLIC ICA/CCA RATIO:  0.6 DIASTOLIC ICA/CCA RATIO:  2.0 ECA:  21 cm/sec RIGHT CAROTID ARTERY: Diffuse right common carotid, carotid bifurcation, and proximal ICA atherosclerotic vascular plaque. No flow limiting stenosis. Degree of stenosis less than 50%. RIGHT VERTEBRAL ARTERY:  Patent with antegrade flow. LEFT CAROTID ARTERY: Mild diffuse left common carotid, carotid bifurcation, proximal ICA atherosclerotic vascular plaque. No flow limiting stenosis. Degree of stenosis less than 50% . LEFT VERTEBRAL ARTERY:  Patent  antegrade flow . IMPRESSION: 1. Mild bilateral diffuse carotid atherosclerotic vascular disease. No flow limiting stenosis. Degree of stenosis less than 50% bilaterally. 2. Vertebral artery patent antegrade flow. Electronically Signed   By: Marcello Moores  Register   On: 07/21/2016 16:29     Management plans discussed with the patient, family and they are in agreement.  CODE STATUS:     Code Status Orders        Start     Ordered   07/20/16 1454  Full code  Continuous     07/20/16 1455    Code Status History    Date Active Date Inactive Code Status Order ID Comments User Context   This patient has a current code status but no historical code  status.    Advance Directive Documentation   Flowsheet Row Most Recent Value  Type of Advance Directive  Out of facility DNR (pink MOST or yellow form)  Pre-existing out of facility DNR order (yellow form or pink MOST form)  No data  "MOST" Form in Place?  No data      TOTAL TIME TAKING CARE OF THIS PATIENT: 40 minutes.    Isa Hitz M.D on 07/22/2016 at 7:49 AM  Between 7am to 6pm - Pager - [phone] After 6pm go to [email or web address] - password [email or web address] [facility]  [facility]  Office  [phone]  CC: Primary care physician; Rosaland Lao, NP

## 2016-07-22 NOTE — Progress Notes (Signed)
Chaplain was making his rounds and visited with pt in room 111. Provided the ministry of prayer and a spiritual presence.    07/21/16 1605  Clinical Encounter Type  Visited With Patient;Patient and family together  Visit Type Initial;Spiritual support  Referral From Nurse  Spiritual Encounters  Spiritual Needs Prayer

## 2016-07-22 NOTE — NC FL2 (Signed)
[place] LEVEL OF CARE SCREENING TOOL     IDENTIFICATION  Patient Name: [name] Birthdate: 1928-02-14 Sex: female Admission Date (Current Location): 07/20/2016  [place] and [name] Number:  [name] [id number] [place] and Address:  [facility], [address] [place], [place] [address]      Provider Number: [id number]  Attending Physician Name and Address:  Fritzi Mandes, MD  Relative Name and Phone Number:  [name] [phone] or [name] [phone] or [name] Sister [phone]      Current Level of Care: Hospital Recommended Level of Care: [employer] Prior Approval Number:    Date Approved/Denied:   PASRR Number: [id number]  Discharge Plan: SNF    Current Diagnoses: Patient Active Problem List   Diagnosis Date Noted  . CVA (cerebral vascular accident) ([place]) 07/20/2016  . Essential hypertension 07/14/2016  . Atherosclerosis of lower extremity with claudication ([place]) 07/14/2016  . PVD (peripheral vascular disease) ([place]) 07/14/2016    Orientation RESPIRATION BLADDER Height & Weight     Self, Time, Situation, Place  Normal Incontinent Weight: 194 lb 6.4 oz (88.2 kg) Height:  5\' 5"  (165.1 cm)  BEHAVIORAL SYMPTOMS/MOOD NEUROLOGICAL BOWEL NUTRITION STATUS      Continent Diet (Dysphagia diet)  AMBULATORY STATUS COMMUNICATION OF NEEDS Skin   Limited Assist   PU Stage and Appropriate Care                       Personal Care Assistance Level of Assistance  Bathing, Feeding, Dressing Bathing Assistance: Limited assistance Feeding assistance: Independent Dressing Assistance: Limited assistance     Functional Limitations Info  Sight, Hearing, Speech Sight Info: Adequate Hearing Info: Adequate Speech Info: Adequate    SPECIAL CARE FACTORS FREQUENCY  PT (By licensed PT), Speech therapy     PT Frequency: 5x a week       Speech Therapy Frequency: Min 3x a week       Contractures Contractures Info: Not present    Additional Factors Info  Allergies, Code Status Code Status Info: Full Code Allergies Info: NKA           Current Medications (07/22/2016):  This is the current hospital active medication list Current Facility-Administered Medications  Medication Dose Route Frequency Provider Last Rate Last Dose  . acetaminophen (TYLENOL) tablet 650 mg  650 mg Oral Q6H PRN Hillary Bow, MD   650 mg at 07/20/16 2128   Or  . acetaminophen (TYLENOL) suppository 650 mg  650 mg Rectal Q6H PRN Srikar [name], MD      . albuterol (PROVENTIL) (2.5 MG/3ML) 0.083% nebulizer solution 2.5 mg  2.5 mg Nebulization Q2H PRN Srikar [name], MD      . allopurinol (ZYLOPRIM) tablet 100 mg  100 mg Oral Daily Hillary Bow, MD   100 mg at 07/22/16 0836  . amitriptyline (ELAVIL) tablet 25 mg  25 mg Oral QHS Hillary Bow, MD   25 mg at 07/21/16 2255  . aspirin EC tablet 81 mg  81 mg Oral Daily Hillary Bow, MD   81 mg at 07/22/16 0836  . atorvastatin (LIPITOR) tablet 40 mg  40 mg Oral q1800 Fritzi Mandes, MD   40 mg at 07/21/16 1637  . clopidogrel (PLAVIX) tablet 75 mg  75 mg Oral Q breakfast Hillary Bow, MD   75 mg at 07/22/16 0835  . docusate sodium (COLACE) capsule 100 mg  100 mg Oral BID Hillary Bow, MD   100 mg at 07/22/16  8453  . DULoxetine (CYMBALTA) DR capsule 60 mg  60 mg Oral Daily Hillary Bow, MD   60 mg at 07/22/16 0835  . enoxaparin (LOVENOX) injection 40 mg  40 mg Subcutaneous Q24H Hillary Bow, MD   40 mg at 07/21/16 2136  . gabapentin (NEURONTIN) capsule 100 mg  100 mg Oral BH-q7a Sheema M Hallaji, RPH   100 mg at 07/22/16 6468   And  . gabapentin (NEURONTIN) capsule 300 mg  300 mg Oral BID Pernell Dupre, RPH   300 mg at 07/21/16 2137  . HYDROcodone-acetaminophen (NORCO/VICODIN) 5-325 MG per tablet 1 tablet  1 tablet Oral Q6H PRN Fritzi Mandes, MD   1 tablet at 07/22/16 0836  . lactobacillus acidophilus (BACID) tablet 1 tablet  1 tablet Oral Daily Hillary Bow, MD   1 tablet at 07/22/16 0853  . levothyroxine (SYNTHROID, LEVOTHROID) tablet 25 mcg  25 mcg Oral QAC breakfast Hillary Bow, MD   25 mcg at 07/22/16 0700  . metoprolol succinate (TOPROL-XL) 24 hr tablet 25 mg  25 mg Oral Daily Hillary Bow, MD   25 mg at 07/22/16 0836  . montelukast (SINGULAIR) tablet 10 mg  10 mg Oral QHS Hillary Bow, MD   10 mg at 07/21/16 2136  . ondansetron (ZOFRAN) tablet 4 mg  4 mg Oral Q6H PRN Hillary Bow, MD       Or  . ondansetron (ZOFRAN) injection 4 mg  4 mg Intravenous Q6H PRN Srikar [name], MD      . pantoprazole (PROTONIX) EC tablet 40 mg  40 mg Oral Daily Hillary Bow, MD   40 mg at 07/22/16 0835  . polyethylene glycol (MIRALAX / GLYCOLAX) packet 17 g  17 g Oral Daily PRN Srikar [name], MD      . sodium chloride flush (NS) 0.9 % injection 3 mL  3 mL Intravenous Q12H Hillary Bow, MD   3 mL at 07/22/16 0855  . topiramate (TOPAMAX) tablet 25 mg  25 mg Oral QHS Hillary Bow, MD   25 mg at 07/21/16 2136  . ursodiol (ACTIGALL) capsule 300 mg  300 mg Oral BID Hillary Bow, MD   300 mg at 07/22/16 0321     Discharge Medications: Please see discharge summary for a list of discharge medications.  Relevant Imaging Results:  Relevant Lab Results:   Additional Information SSN [id number]  [name]

## 2016-07-22 NOTE — Progress Notes (Addendum)
Speech Language Pathology Treatment: Dysphagia  Patient Details Name: [name] MRN: [id number] DOB: 1927/12/16 Today's Date: 07/22/2016 Time: 7579-7282 SLP Time Calculation (min) (ACUTE ONLY): 55 min  Assessment / Plan / Recommendation Clinical Impression  Pt continues to appear at min increased risk for aspiration moreso w/ oral intake secondary to oral phase deficits and suspected delayed pharyngeal swallow. Pt appeared to adquately tolerate trials of Nectar liquids via cup feeding self w/ no overt s/s of aspiration noted; similar presentation w/ puree trials. Vocal quality appeared to remain clear b/t trials; no decline in vocal quality noted during trials. Oral phase grossly wfl for bolus management and oral clearing but pt did require min increased time for lingual sweeping and f/u swallows intermittently to clear any oral residue. Pt continues to present w/ R labial/oral weakness. Pt required feeding support w/ setup but is able to hold cup for drinking and use the built-up spoon to feed self.  Due to pt's suspected Left CVA w/ R sided weakness, and Oral phase dysphagia, pt is at increased risk for dysphagia w/ aspiration and sequelae of such including Pneumonia. Pt would benefit from a modified dsyphagia diet of Dysphagia 1 w/ Nectar liquids w/ strict aspiration precautions; meds in Puree. ST will f/u w/ trials to upgrade and/or objective swallow assessment as indicated. OMEs; Dysarthria assessment to follow at discharge to SNF/Rehab. Of note, pt does have documented Esophageal dysmotility per chart notes; brief education was given as the Esophageal phase of swallowing can have impact on the oropharyngeal phases of swallowing w/ the added risk of aspiration of REflux material if regurgitation occurs.    HPI HPI: Pt is a 82 y.o. female with a known history of HTN, COPD, Arthritis who walks with a walker at baseline noticed significant right weakness and numbness yesterday. She waited Hoping  symptoms would improve. Today due to persistent right-sided weakness and numbness patient presented to the emergency room. She also has slurred speech but no dysphagia. No change in vision. Pt appears weak w/ low volume of speech but able to answer basic questions re: self and follow basic commands. Of note, pt has Esophageal phase dysphagia c/b small hiatal hernia with small B ring w/ Delay of passage of a standardized barium tablet noted per Barium study 05/2016(GI).      SLP Plan  Continue with current plan of care     Recommendations  Diet recommendations: Dysphagia 1 (puree);Nectar-thick liquid Liquids provided via: Cup Medication Administration: Whole meds with puree (as tolerates) Supervision: Patient able to self feed;Staff to assist with self feeding;Intermittent supervision to cue for compensatory strategies Compensations: Minimize environmental distractions;Slow rate;Small sips/bites;Lingual sweep for clearance of pocketing;Multiple dry swallows after each bite/sip;Follow solids with liquid Postural Changes and/or Swallow Maneuvers: Seated upright 90 degrees;Upright 30-60 min after meal (Reflux precautions)                General recommendations:  (Dietician f/u) Oral Care Recommendations: Oral care BID;Staff/trained caregiver to provide oral care Follow up Recommendations: Skilled Nursing facility Plan: Continue with current plan of care       [place], [place], CCC-SLP [name] 07/22/2016, 12:55 PM

## 2016-07-24 ENCOUNTER — Ambulatory Visit (INDEPENDENT_AMBULATORY_CARE_PROVIDER_SITE_OTHER): Payer: Medicare Other | Admitting: Vascular Surgery

## 2016-07-28 ENCOUNTER — Encounter (INDEPENDENT_AMBULATORY_CARE_PROVIDER_SITE_OTHER): Payer: Self-pay | Admitting: Vascular Surgery

## 2016-07-28 ENCOUNTER — Ambulatory Visit (INDEPENDENT_AMBULATORY_CARE_PROVIDER_SITE_OTHER): Payer: Medicare Other | Admitting: Vascular Surgery

## 2016-07-28 VITALS — BP 100/56 | HR 63 | Resp 16 | Wt 183.0 lb

## 2016-07-28 DIAGNOSIS — I10 Essential (primary) hypertension: Secondary | ICD-10-CM | POA: Diagnosis not present

## 2016-07-28 DIAGNOSIS — I63442 Cerebral infarction due to embolism of left cerebellar artery: Secondary | ICD-10-CM

## 2016-07-28 DIAGNOSIS — I70263 Atherosclerosis of native arteries of extremities with gangrene, bilateral legs: Secondary | ICD-10-CM | POA: Diagnosis not present

## 2016-07-28 NOTE — Progress Notes (Signed)
MRN : [id number]  [name] is a 82 y.o. ([birth year]) female who presents with chief complaint of  Chief Complaint  Patient presents with  . Follow-up  .  History of Present Illness: The patient is seen for evaluation of painful lower extremities and diminished pulses associated with ulceration of several toes on both feet.  The patient notes the ulcer has been present for multiple weeks and has not been improving.  It is very painful but has not had any drainage.  No specific history of trauma noted by the patient.  The patient denies fever or chills.  the patient does have diabetes which has been difficult to control.  Patient notes prior to the ulcer developing the extremities were painful particularly with ambulation or activity and the discomfort is very consistent day today. The pain has been progressive over the past several years.   The patient denies rest pain or dangling of an extremity off the side of the bed during the night for relief. No prior interventions or surgeries.  No history of back problems or DJD of the lumbar sacral spine.   The patient denies amaurosis fugax or recent TIA symptoms. There are no recent neurological changes noted. The patient denies history of DVT, PE or superficial thrombophlebitis. The patient denies recent episodes of angina or shortness of breath.   Current Meds  Medication Sig  . allopurinol (ZYLOPRIM) 100 MG tablet Take 100 mg by mouth daily.  Marland Kitchen amitriptyline (ELAVIL) 25 MG tablet Take 25 mg by mouth at bedtime.  Marland Kitchen aspirin EC 81 MG EC tablet Take 1 tablet (81 mg total) by mouth daily.  Marland Kitchen atorvastatin (LIPITOR) 40 MG tablet Take 1 tablet (40 mg total) by mouth daily at 6 PM.  . Cholecalciferol (VITAMIN D-3) 1000 UNITS CAPS Take 2,000 Units by mouth daily.   . clopidogrel (PLAVIX) 75 MG tablet Take 75 mg by mouth daily with breakfast.  . DULoxetine (CYMBALTA) 60 MG capsule Take 60 mg by mouth daily.  . furosemide (LASIX) 20 MG tablet  Take 1 tablet (20 mg total) by mouth every other day.  . gabapentin (NEURONTIN) 100 MG capsule Take 100 mg by mouth 3 (three) times daily. Take 1 cap (100mg ) in morning, 3 caps (300mg ) at 1400 and 2100  . HYDROcodone-acetaminophen (NORCO/VICODIN) 5-325 MG tablet Take 1 tablet by mouth every 6 (six) hours as needed for moderate pain.  Marland Kitchen levothyroxine (SYNTHROID, LEVOTHROID) 25 MCG tablet Take 25 mcg by mouth daily before breakfast.  . metoprolol succinate (TOPROL-XL) 25 MG 24 hr tablet Take 25 mg by mouth daily.  . montelukast (SINGULAIR) 10 MG tablet Take 10 mg by mouth daily.  . pantoprazole (PROTONIX) 40 MG tablet Take 40 mg by mouth daily.  . Probiotic Product (RISA-BID PROBIOTIC) TABS Take 1 tablet by mouth daily.  Orlie Dakin Sodium (SENNA) 8.6-50 MG TABS Take 1 tablet by mouth every three (3) days as needed (for no bowel movement (constipation)).   . topiramate (TOPAMAX) 25 MG tablet Take 25 mg by mouth at bedtime.  . traMADol (ULTRAM) 50 MG tablet Take 50 mg by mouth every 8 (eight) hours as needed (for pain control).   . ursodiol (ACTIGALL) 300 MG capsule Take 300 mg by mouth 2 (two) times daily.  . vitamin B-12 (CYANOCOBALAMIN) 1000 MCG tablet Take 1,000 mcg by mouth daily.    Past Medical History:  Diagnosis Date  . Anxiety   . Arthritis   . Asthma   . Calculus of  gallbladder without mention of cholecystitis, with obstruction   . Candidiasis of the esophagus   . Chronic kidney disease, stage III (moderate)   . Chronic pain syndrome   . Corns and callosities   . Depression   . Emphysema lung ([place])   . Esophageal reflux   . Essential hypertension, benign   . Gout, unspecified   . Hypertension   . Hypoglycemia, unspecified   . Hypopotassemia   . Migraine, unspecified, without mention of intractable migraine without mention of status migrainosus   . Other vitamin B12 deficiency anemia   . Personal history of fall   . Unspecified asthma(493.90)   . Unspecified  constipation   . Unspecified hypertensive heart disease with heart failure(402.91)   . Unspecified vitamin D deficiency     Past Surgical History:  Procedure Laterality Date  . ABDOMINAL HYSTERECTOMY    . arterial thrombi stents in both legs    . BACK SURGERY    . COLONOSCOPY    . ESOPHAGOGASTRODUODENOSCOPY (EGD) WITH PROPOFOL N/A 06/19/2016   Procedure: ESOPHAGOGASTRODUODENOSCOPY (EGD) WITH PROPOFOL;  Surgeon: Lollie Sails, MD;  Location[facility]COPY;  Service: Endoscopy;  Laterality: N/A;  . EYE SURGERY     cataract extraction    Social History Social History  Substance Use Topics  . Smoking status: Never Smoker  . Smokeless tobacco: Never Used  . Alcohol use No    Family History Family History  Problem Relation Age of Onset  . Cancer Father   . Stroke Father   No family history of bleeding/clotting disorders, porphyria or autoimmune disease   No Known Allergies   REVIEW OF SYSTEMS (Negative unless checked)  Constitutional: [] Weight loss  [] Fever  [] Chills Cardiac: [] Chest pain   [] Chest pressure   [] Palpitations   [] Shortness of breath when laying flat   [] Shortness of breath with exertion. Vascular:  [] Pain in legs with walking   [x] Pain in legs at rest  [] History of DVT   [] Phlebitis   [] Swelling in legs   [] Varicose veins   [x] Non-healing ulcers Pulmonary:   [] Uses home oxygen   [] Productive cough   [] Hemoptysis   [] Wheeze  [] COPD   [] Asthma Neurologic:  [] Dizziness   [] Seizures   [] History of stroke   [] History of TIA  [] Aphasia   [] Vissual changes   [] Weakness or numbness in arm   [x] Weakness or numbness in leg Musculoskeletal:   [] Joint swelling   [] Joint pain   [] Low back pain Hematologic:  [] Easy bruising  [] Easy bleeding   [] Hypercoagulable state   [] Anemic Gastrointestinal:  [] Diarrhea   [] Vomiting  [] Gastroesophageal reflux/heartburn   [] Difficulty swallowing. Genitourinary:  [x] Chronic kidney disease   [] Difficult urination  [] Frequent urination    [] Blood in urine Skin:  [] Rashes   [] Ulcers  Psychological:  [] History of anxiety   []  History of major depression.  Physical Examination  Vitals:   07/28/16 1613  BP: (!) 100/56  Pulse: 63  Resp: 16  Weight: 83 kg (183 lb)   Body mass index is 30.45 kg/m. Gen: WD/WN, NAD Head: [place]/AT, No temporalis wasting.  Ear/Nose/Throat: Hearing grossly intact, nares w/o erythema or drainage, poor dentition Eyes: PER, EOMI, sclera nonicteric.  Neck: Supple, no masses.  No bruit or JVD.  Pulmonary:  Good air movement, clear to auscultation bilaterally, no use of accessory muscles.  Cardiac: RRR, normal S1, S2, no Murmurs. Vascular:  There is dry gangrene of the left 2nd and 3rd toes on the dorsal surface and the right 2nd  toe again dry; no odor no pus.  Both feet are cool to the touch with sluggish capillary refill. Vessel Right Left  Radial Palpable Palpable  Ulnar Palpable Palpable  Brachial Palpable Palpable  Carotid Palpable Palpable  Femoral Palpable Palpable  Popliteal Not Palpable Not Palpable  PT Not Palpable Not Palpable  DP Not Palpable Not Palpable   Gastrointestinal: soft, non-distended. No guarding/no peritoneal signs.  Musculoskeletal: M/S 5/5 throughout.  No deformity or atrophy.  Neurologic: CN 2-12 intact. Pain and light touch intact in extremities.  Symmetrical.  Speech is fluent. Motor exam as listed above. Psychiatric: Judgment intact, Mood & affect appropriate for pt's clinical situation. Dermatologic: No rashes or ulcers noted.  No changes consistent with cellulitis. Lymph : No Cervical lymphadenopathy, no lichenification or skin changes of chronic lymphedema.  CBC Lab Results  Component Value Date   WBC 9.2 07/20/2016   HGB 12.0 07/20/2016   HCT 36.2 07/20/2016   MCV 94.3 07/20/2016   PLT 228 07/20/2016    BMET    Component Value Date/Time   NA 138 07/20/2016 1345   NA 139 09/22/2014 0521   K 4.3 07/20/2016 1345   K 4.4 09/22/2014 0521   CL 104  07/20/2016 1345   CL 109 (H) 09/22/2014 0521   CO2 27 07/20/2016 1345   CO2 24 09/22/2014 0521   GLUCOSE 90 07/20/2016 1345   GLUCOSE 83 09/22/2014 0521   BUN 17 07/20/2016 1345   BUN 9 09/22/2014 0521   CREATININE 1.36 (H) 07/20/2016 1345   CREATININE 0.97 09/22/2014 0521   CALCIUM 9.0 07/20/2016 1345   CALCIUM 9.0 09/22/2014 0521   GFRNONAA 34 (L) 07/20/2016 1345   GFRNONAA 58 (L) 09/22/2014 0521   GFRNONAA 43 (L) 02/27/2013 1139   GFRAA 40 (L) 07/20/2016 1345   GFRAA >60 09/22/2014 0521   GFRAA 50 (L) 02/27/2013 1139   Estimated Creatinine Clearance: 31.6 mL/min (by C-G formula based on SCr of 1.36 mg/dL (H)).  COAG Lab Results  Component Value Date   INR 1.12 07/20/2016   INR 1.04 06/19/2016    Radiology Ct Head Wo Contrast  Result Date: 07/20/2016 CLINICAL DATA:  Right-sided weakness. EXAM: CT HEAD WITHOUT CONTRAST TECHNIQUE: Contiguous axial images were obtained from the base of the skull through the vertex without intravenous contrast. COMPARISON:  None. FINDINGS: Brain: No subdural, epidural, or subarachnoid hemorrhage. Ventricles and sulci are stable. Multiple infarcts in the cerebellar hemispheres are stable. Possible lacunar infarcts in the brainstem as well. Basal cisterns are patent. White matter changes are stable. No mass, mass effect, or midline shift. No acute cortical ischemia or infarct. Vascular: Calcified atherosclerotic changes seen in the intracranial carotid arteries. Skull: Normal. Negative for fracture or focal lesion. Sinuses/Orbits: No acute finding. Other: None. IMPRESSION: No acute intracranial abnormality identified. Chronic changes as above. Electronically Signed   By: Dorise Bullion III M.D   On: 07/20/2016 13:20   Mr Jodene Nam Head Wo Contrast  Result Date: 07/21/2016 CLINICAL DATA:  New onset right-sided weakness beginning 2 days ago. Symptoms were preceded by dizziness. EXAM: MRI HEAD WITHOUT CONTRAST MRA HEAD WITHOUT CONTRAST TECHNIQUE: Multiplanar,  multiecho pulse sequences of the brain and surrounding structures were obtained without intravenous contrast. Angiographic images of the head were obtained using MRA technique without contrast. COMPARISON:  CT head without contrast 07/20/2016 FINDINGS: MRI HEAD FINDINGS Brain: The diffusion-weighted images demonstrate acute/ subacute nonhemorrhagic left pontine infarct measuring 16 mm in maximal dimension. No acute supratentorial infarct is present. T2 changes  are associated with the infarct compatible with the subacute time frame. Remote infarcts are present in the inferior cerebellum bilaterally. Moderate periventricular and subcortical T2 changes are present bilaterally. A remote lacunar infarct is present in the left lentiform nucleus. Remote lacunar infarcts are present in the left thalamus. No acute hemorrhage or mass lesion is present. The ventricles are of normal size. No significant extra-axial fluid collection is present. Vascular: The flow is present in the major intracranial arteries. Skull and upper cervical spine: The skullbase is within normal limits. Midline intracranial sagittal structures are normal. Degenerative changes are present in the cervical spine with central canal narrowing at C3-4. Marrow signal is normal. Sinuses/Orbits: Mild mucosal thickening is present along the floor of the right maxillary sinus. The paranasal sinuses and mastoid air cells are otherwise clear. Bilateral lens replacements are noted. The globes and orbits are otherwise intact. MRA HEAD FINDINGS Moderate atherosclerotic irregularity is present within the cavernous internal carotid arteries bilaterally without a significant stenosis relative to the more distal vessel. There is mild narrowing of the distal right A1 segment. The left A1 segment is normal. Atherosclerotic irregularity is present in the M1 segments bilaterally without a significant stenosis. The MCA bifurcations are intact. There is moderate attenuation of  MCA branch vessels, left greater than right. Next The left vertebral artery is dominant. Atherosclerotic irregularity is present in the distal vertebral arteries bilaterally. PICA origins are visualized and normal. The vertebrobasilar junction is normal. The left posterior cerebral artery originates from the basilar tip. The right posterior cerebral artery is of fetal type. There is moderate attenuation of distal PCA branch vessels bilaterally. IMPRESSION: 1. Acute/subacute nonhemorrhagic left pontine infarct. 2. Remote bilateral posterior cerebellar infarcts. 3. Moderate atrophy and white matter disease compatible with chronic microvascular ischemia. 4. MRA circle of Willis demonstrate significant medium and distal small vessel disease without a significant proximal stenosis or aneurysm. 5. Atherosclerotic changes within the cavernous internal carotid arteries and right greater than left A1 segments without a significant stenosis. 6. Minimal right maxillary sinus disease. Electronically Signed   By: San Morelle M.D.   On: 07/21/2016 14:36   Mr Brain Wo Contrast  Result Date: 07/21/2016 CLINICAL DATA:  New onset right-sided weakness beginning 2 days ago. Symptoms were preceded by dizziness. EXAM: MRI HEAD WITHOUT CONTRAST MRA HEAD WITHOUT CONTRAST TECHNIQUE: Multiplanar, multiecho pulse sequences of the brain and surrounding structures were obtained without intravenous contrast. Angiographic images of the head were obtained using MRA technique without contrast. COMPARISON:  CT head without contrast 07/20/2016 FINDINGS: MRI HEAD FINDINGS Brain: The diffusion-weighted images demonstrate acute/ subacute nonhemorrhagic left pontine infarct measuring 16 mm in maximal dimension. No acute supratentorial infarct is present. T2 changes are associated with the infarct compatible with the subacute time frame. Remote infarcts are present in the inferior cerebellum bilaterally. Moderate periventricular and  subcortical T2 changes are present bilaterally. A remote lacunar infarct is present in the left lentiform nucleus. Remote lacunar infarcts are present in the left thalamus. No acute hemorrhage or mass lesion is present. The ventricles are of normal size. No significant extra-axial fluid collection is present. Vascular: The flow is present in the major intracranial arteries. Skull and upper cervical spine: The skullbase is within normal limits. Midline intracranial sagittal structures are normal. Degenerative changes are present in the cervical spine with central canal narrowing at C3-4. Marrow signal is normal. Sinuses/Orbits: Mild mucosal thickening is present along the floor of the right maxillary sinus. The paranasal sinuses and mastoid air  cells are otherwise clear. Bilateral lens replacements are noted. The globes and orbits are otherwise intact. MRA HEAD FINDINGS Moderate atherosclerotic irregularity is present within the cavernous internal carotid arteries bilaterally without a significant stenosis relative to the more distal vessel. There is mild narrowing of the distal right A1 segment. The left A1 segment is normal. Atherosclerotic irregularity is present in the M1 segments bilaterally without a significant stenosis. The MCA bifurcations are intact. There is moderate attenuation of MCA branch vessels, left greater than right. Next The left vertebral artery is dominant. Atherosclerotic irregularity is present in the distal vertebral arteries bilaterally. PICA origins are visualized and normal. The vertebrobasilar junction is normal. The left posterior cerebral artery originates from the basilar tip. The right posterior cerebral artery is of fetal type. There is moderate attenuation of distal PCA branch vessels bilaterally. IMPRESSION: 1. Acute/subacute nonhemorrhagic left pontine infarct. 2. Remote bilateral posterior cerebellar infarcts. 3. Moderate atrophy and white matter disease compatible with chronic  microvascular ischemia. 4. MRA circle of Willis demonstrate significant medium and distal small vessel disease without a significant proximal stenosis or aneurysm. 5. Atherosclerotic changes within the cavernous internal carotid arteries and right greater than left A1 segments without a significant stenosis. 6. Minimal right maxillary sinus disease. Electronically Signed   By: San Morelle M.D.   On: 07/21/2016 14:36   US Carotid Bilateral  Result Date: 07/21/2016 CLINICAL DATA:  Stroke. EXAM: BILATERAL CAROTID DUPLEX ULTRASOUND TECHNIQUE: Pearline Cables scale imaging, color Doppler and duplex ultrasound were performed of bilateral carotid and vertebral arteries in the neck. COMPARISON:  MRI 07/21/2016. FINDINGS: Criteria: Quantification of carotid stenosis is based on velocity parameters that correlate the residual internal carotid diameter with NASCET-based stenosis levels, using the diameter of the distal internal carotid lumen as the denominator for stenosis measurement. The following velocity measurements were obtained: RIGHT ICA:  80/13 cm/sec CCA:  66/06 cm/sec SYSTOLIC ICA/CCA RATIO:  1.1 DIASTOLIC ICA/CCA RATIO:  1.3 ECA:  6 cm/sec LEFT ICA:  51/15 cm/sec CCA:  30/1 cm/sec SYSTOLIC ICA/CCA RATIO:  0.6 DIASTOLIC ICA/CCA RATIO:  2.0 ECA:  21 cm/sec RIGHT CAROTID ARTERY: Diffuse right common carotid, carotid bifurcation, and proximal ICA atherosclerotic vascular plaque. No flow limiting stenosis. Degree of stenosis less than 50%. RIGHT VERTEBRAL ARTERY:  Patent with antegrade flow. LEFT CAROTID ARTERY: Mild diffuse left common carotid, carotid bifurcation, proximal ICA atherosclerotic vascular plaque. No flow limiting stenosis. Degree of stenosis less than 50% . LEFT VERTEBRAL ARTERY:  Patent antegrade flow . IMPRESSION: 1. Mild bilateral diffuse carotid atherosclerotic vascular disease. No flow limiting stenosis. Degree of stenosis less than 50% bilaterally. 2. Vertebral artery patent antegrade flow.  Electronically Signed   By: Abie   On: 07/21/2016 16:29    Assessment/Plan 1. 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  This represents a limb threatening ischemia and places the patient at the risk for limb loss.  Patient should undergo angiography of the lower extremities with the hope for intervention for limb salvage.  The risks and benefits as well as the alternative therapies was discussed in detail with the patient.  All questions were answered.  Patient agrees to proceed with angiography.  Given that the gangrenous changes are more prominent on the left I will address the left leg angiogram first and then the right leg.  The patient  will follow up with me in the office after the procedure.   2. Essential hypertension Continue antihypertensive medications as already ordered and reviewed, no changes at this time.  3. Cerebrovascular accident (CVA) due to embolism of left cerebellar artery (HCC) Continue antiplatelet medications and the statin as ordered and reviewed, no changes at this time  Hortencia Pilar, MD  07/28/2016 4:42 PM

## 2016-07-29 ENCOUNTER — Encounter (INDEPENDENT_AMBULATORY_CARE_PROVIDER_SITE_OTHER): Payer: Self-pay

## 2016-07-29 ENCOUNTER — Other Ambulatory Visit (INDEPENDENT_AMBULATORY_CARE_PROVIDER_SITE_OTHER): Payer: Self-pay | Admitting: Vascular Surgery

## 2016-09-01 ENCOUNTER — Encounter
Admission: RE | Admit: 2016-09-01 | Discharge: 2016-09-01 | Disposition: A | Discharge status: Home / Self Care | Payer: Medicare Other | Source: Ambulatory Visit | Attending: Vascular Surgery | Admitting: Vascular Surgery

## 2016-09-01 DIAGNOSIS — N183 Chronic kidney disease, stage 3 (moderate): Secondary | ICD-10-CM | POA: Diagnosis not present

## 2016-09-01 DIAGNOSIS — I129 Hypertensive chronic kidney disease with stage 1 through stage 4 chronic kidney disease, or unspecified chronic kidney disease: Secondary | ICD-10-CM | POA: Diagnosis not present

## 2016-09-01 DIAGNOSIS — Z79899 Other long term (current) drug therapy: Secondary | ICD-10-CM | POA: Diagnosis not present

## 2016-09-01 DIAGNOSIS — L97919 Non-pressure chronic ulcer of unspecified part of right lower leg with unspecified severity: Secondary | ICD-10-CM | POA: Diagnosis not present

## 2016-09-01 DIAGNOSIS — I70268 Atherosclerosis of native arteries of extremities with gangrene, other extremity: Secondary | ICD-10-CM | POA: Diagnosis not present

## 2016-09-01 HISTORY — DX: Hemiplegia, unspecified affecting unspecified side: G81.90

## 2016-09-01 HISTORY — DX: Peripheral vascular disease, unspecified: I73.9

## 2016-09-01 HISTORY — DX: Cerebral infarction, unspecified: I63.9

## 2016-09-01 LAB — CREATININE, SERUM
CREATININE: 1.23 mg/dL — AB (ref 0.44–1.00)
GFR, EST AFRICAN AMERICAN: 45 mL/min — AB (ref >60)
GFR, EST NON AFRICAN AMERICAN: 39 mL/min — AB (ref >60)

## 2016-09-01 LAB — BUN: BUN: 12 mg/dL (ref 6–20)

## 2016-09-02 ENCOUNTER — Encounter
Admission: RE | Disposition: A | Discharge status: Home / Self Care | Payer: Self-pay | Source: Ambulatory Visit | Attending: Vascular Surgery

## 2016-09-02 ENCOUNTER — Ambulatory Visit
Admission: RE | Admit: 2016-09-02 | Discharge: 2016-09-02 | Disposition: A | Discharge status: Home / Self Care | Payer: Medicare Other | Source: Ambulatory Visit | Attending: Vascular Surgery | Admitting: Vascular Surgery

## 2016-09-02 DIAGNOSIS — I70268 Atherosclerosis of native arteries of extremities with gangrene, other extremity: Secondary | ICD-10-CM | POA: Diagnosis not present

## 2016-09-02 DIAGNOSIS — L97919 Non-pressure chronic ulcer of unspecified part of right lower leg with unspecified severity: Secondary | ICD-10-CM | POA: Insufficient documentation

## 2016-09-02 DIAGNOSIS — I129 Hypertensive chronic kidney disease with stage 1 through stage 4 chronic kidney disease, or unspecified chronic kidney disease: Secondary | ICD-10-CM | POA: Insufficient documentation

## 2016-09-02 DIAGNOSIS — Z79899 Other long term (current) drug therapy: Secondary | ICD-10-CM | POA: Insufficient documentation

## 2016-09-02 DIAGNOSIS — N183 Chronic kidney disease, stage 3 (moderate): Secondary | ICD-10-CM | POA: Insufficient documentation

## 2016-09-02 DIAGNOSIS — I70248 Atherosclerosis of native arteries of left leg with ulceration of other part of lower left leg: Secondary | ICD-10-CM | POA: Diagnosis not present

## 2016-09-02 HISTORY — PX: PERIPHERAL VASCULAR CATHETERIZATION: SHX172C

## 2016-09-02 SURGERY — LOWER EXTREMITY ANGIOGRAPHY
Anesthesia: Moderate Sedation | Site: Leg Lower | Laterality: Left

## 2016-09-02 MED ORDER — METHYLPREDNISOLONE SODIUM SUCC 125 MG IJ SOLR: 125.0000 mg | INTRAMUSCULAR | Status: DC | PRN

## 2016-09-02 MED ORDER — LIDOCAINE-EPINEPHRINE (PF) 2 %-1:200000 IJ SOLN
INTRAMUSCULAR | Status: AC
  Filled 2016-09-02: qty 20

## 2016-09-02 MED ORDER — MORPHINE SULFATE (PF) 4 MG/ML IV SOLN: 2.0000 mg | INTRAVENOUS | Status: DC | PRN

## 2016-09-02 MED ORDER — CEFAZOLIN IN D5W 1 GM/50ML IV SOLN
1.0000 g | Freq: Once | INTRAVENOUS | Status: AC
  Administered 2016-09-02: 1 g via INTRAVENOUS

## 2016-09-02 MED ORDER — SODIUM CHLORIDE 0.9 % IV SOLN
INTRAVENOUS | Status: DC
  Administered 2016-09-02: 07:00:00 via INTRAVENOUS

## 2016-09-02 MED ORDER — MIDAZOLAM HCL 5 MG/5ML IJ SOLN
INTRAMUSCULAR | Status: AC
  Filled 2016-09-02: qty 5

## 2016-09-02 MED ORDER — DOCUSATE SODIUM 100 MG PO CAPS
100.0000 mg | ORAL_CAPSULE | Freq: Every day | ORAL | Status: DC
  Filled 2016-09-02: qty 1

## 2016-09-02 MED ORDER — FAMOTIDINE 20 MG PO TABS: 40.0000 mg | ORAL_TABLET | ORAL | Status: DC | PRN

## 2016-09-02 MED ORDER — PANTOPRAZOLE SODIUM 40 MG PO TBEC
40.0000 mg | DELAYED_RELEASE_TABLET | Freq: Every day | ORAL | Status: DC
  Filled 2016-09-02: qty 1

## 2016-09-02 MED ORDER — SODIUM CHLORIDE 0.9 % IV SOLN: INTRAVENOUS | Status: DC

## 2016-09-02 MED ORDER — HEPARIN (PORCINE) IN NACL 2-0.9 UNIT/ML-% IJ SOLN
INTRAMUSCULAR | Status: AC
  Filled 2016-09-02: qty 1000

## 2016-09-02 MED ORDER — ONDANSETRON HCL 4 MG/2ML IJ SOLN: 4.0000 mg | Freq: Four times a day (QID) | INTRAMUSCULAR | Status: DC | PRN

## 2016-09-02 MED ORDER — ALUM & MAG HYDROXIDE-SIMETH 200-200-20 MG/5ML PO SUSP: 15.0000 mL | ORAL | Status: DC | PRN

## 2016-09-02 MED ORDER — HEPARIN SODIUM (PORCINE) 1000 UNIT/ML IJ SOLN
INTRAMUSCULAR | Status: DC | PRN
  Administered 2016-09-02: 5000 [IU] via INTRAVENOUS

## 2016-09-02 MED ORDER — CEFAZOLIN IN D5W 1 GM/50ML IV SOLN
INTRAVENOUS | Status: AC
  Administered 2016-09-02: 1 g via INTRAVENOUS
  Filled 2016-09-02: qty 50

## 2016-09-02 MED ORDER — DEXTROSE 5 % IV SOLN: 1.5000 g | INTRAVENOUS | Status: DC

## 2016-09-02 MED ORDER — HEPARIN SODIUM (PORCINE) 1000 UNIT/ML IJ SOLN
INTRAMUSCULAR | Status: AC
  Filled 2016-09-02: qty 1

## 2016-09-02 MED ORDER — MIDAZOLAM HCL 2 MG/2ML IJ SOLN
INTRAMUSCULAR | Status: DC | PRN
  Administered 2016-09-02: 2 mg via INTRAVENOUS

## 2016-09-02 MED ORDER — HYDROMORPHONE HCL 1 MG/ML IJ SOLN: 1.0000 mg | Freq: Once | INTRAMUSCULAR | Status: DC

## 2016-09-02 MED ORDER — LIDOCAINE HCL (PF) 1 % IJ SOLN
INTRAMUSCULAR | Status: AC
  Filled 2016-09-02: qty 30

## 2016-09-02 MED ORDER — FENTANYL CITRATE (PF) 100 MCG/2ML IJ SOLN
INTRAMUSCULAR | Status: AC
  Filled 2016-09-02: qty 2

## 2016-09-02 MED ORDER — HYDRALAZINE HCL 20 MG/ML IJ SOLN: 5.0000 mg | INTRAMUSCULAR | Status: DC | PRN

## 2016-09-02 MED ORDER — LABETALOL HCL 5 MG/ML IV SOLN: 10.0000 mg | INTRAVENOUS | Status: DC | PRN

## 2016-09-02 MED ORDER — FENTANYL CITRATE (PF) 100 MCG/2ML IJ SOLN
INTRAMUSCULAR | Status: DC | PRN
  Administered 2016-09-02: 50 ug via INTRAVENOUS

## 2016-09-02 MED ORDER — ACETAMINOPHEN 325 MG PO TABS: 325.0000 mg | ORAL_TABLET | ORAL | Status: DC | PRN

## 2016-09-02 MED ORDER — ACETAMINOPHEN 325 MG RE SUPP
325.0000 mg | RECTAL | Status: DC | PRN
  Filled 2016-09-02: qty 2

## 2016-09-02 MED ORDER — METOPROLOL TARTRATE 5 MG/5ML IV SOLN: 5.0000 mg | Freq: Four times a day (QID) | INTRAVENOUS | Status: DC

## 2016-09-02 MED ORDER — OXYCODONE HCL 5 MG PO TABS
5.0000 mg | ORAL_TABLET | ORAL | Status: DC | PRN
  Filled 2016-09-02: qty 2

## 2016-09-02 MED ORDER — CEFUROXIME SODIUM 1.5 G IJ SOLR: 1.5000 g | INTRAMUSCULAR | Status: DC

## 2016-09-02 SURGICAL SUPPLY — 24 items
BALLN LUTONIX DCB 4X60X130 (BALLOONS) ×2
BALLN LUTONIX DCB 5X100X130 (BALLOONS) ×2
BALLN LUTONIX DCB 5X80X130 (BALLOONS) ×2
BALLN ULTRVRSE 3X150X150 (BALLOONS) ×2
BALLOON LUTONIX DCB 4X60X130 (BALLOONS) ×1 IMPLANT
BALLOON LUTONIX DCB 5X100X130 (BALLOONS) ×1 IMPLANT
BALLOON LUTONIX DCB 5X80X130 (BALLOONS) ×1 IMPLANT
BALLOON ULTRVRSE 3X150X150 (BALLOONS) ×1 IMPLANT
CATH CROSSER S6 154CM (CATHETERS) ×2 IMPLANT
CATH PIG 70CM (CATHETERS) ×2 IMPLANT
CATH STS 5FR 125CM (CATHETERS) ×2 IMPLANT
CATH USHER TPER 130CM (CATHETERS) ×2 IMPLANT
DEVICE PRESTO INFLATION (MISCELLANEOUS) ×2 IMPLANT
DEVICE STARCLOSE SE CLOSURE (Vascular Products) ×2 IMPLANT
GLIDEWIRE ANGLED SS 035X260CM (WIRE) ×4 IMPLANT
KIT FLOWMATE PROCEDURAL (MISCELLANEOUS) ×2 IMPLANT
PACK ANGIOGRAPHY (CUSTOM PROCEDURE TRAY) ×2 IMPLANT
SET INTRO CAPELLA COAXIAL (SET/KITS/TRAYS/PACK) ×2 IMPLANT
SHEATH BRITE TIP 5FRX11 (SHEATH) ×2 IMPLANT
SHEATH FLEXOR ANSEL2 7FRX45 (SHEATH) ×2 IMPLANT
SYR MEDRAD MARK V 150ML (SYRINGE) ×2 IMPLANT
TUBING CONTRAST HIGH PRESS 72 (TUBING) ×2 IMPLANT
WIRE G V18X300CM (WIRE) ×2 IMPLANT
WIRE J 3MM .035X145CM (WIRE) ×2 IMPLANT

## 2016-09-02 NOTE — Discharge Instructions (Signed)

## 2016-09-02 NOTE — Op Note (Signed)
[facility] Percutaneous Study/Intervention Procedural Note   Date of Surgery: 09/02/2016  Surgeon:  Katha Cabal, MD.  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 Introduction catheter into left lower extremity 3rd order catheter placement  2. Contrast injection left lower extremity for distal runoff   3. Crosser atherectomy of the peroneal artery 4.  Percutaneous transluminal angioplasty left superficial femoral artery and popliteal arteries             5.  Percutaneous transluminal angioplasty left peroneal artery             6.   Star close closure right common femoral arteriotomy  Anesthesia: Conscious sedation was administered under my direct supervision. IV Versed plus fentanyl were utilized. Continuous ECG, pulse oximetry and blood pressure was monitored throughout the entire procedure.  Conscious sedation was for a total of 110 minutes.  Sheath: 7 French Rabi sheath right common femoral artery  Contrast: 80 cc  Fluoroscopy Time: 8.8 minutes  Indications: [name] presents with gangrenous changes of multiple toes on both feet. She is at high risk for limb loss. The risks and benefits are reviewed all questions answered patient agrees to proceed.  Procedure: [name] is a 83 y.o. y.o. female who was identified and appropriate procedural time out was performed. The patient was then placed supine on the table and prepped and draped in the usual sterile fashion.   Ultrasound was placed in the sterile sleeve and the right groin was evaluated the right common femoral artery was echolucent and pulsatile indicating patency.  Image was recorded for the permanent record and under real-time visualization a microneedle was inserted into  the common femoral artery microwire followed by a micro-sheath.  A J-wire was then advanced through the micro-sheath and a  5 [place] sheath was then inserted over a J-wire. J-wire was then advanced and a 5 French pigtail catheter was positioned at the level of T12. AP projection of the aorta was then obtained. Pigtail catheter was repositioned to above the bifurcation and a RAO view of the pelvis was obtained.  Subsequently a pigtail catheter with the stiff angle Glidewire was used to cross the aortic bifurcation the catheter wire were advanced down into the left distal external iliac artery. Oblique view of the femoral bifurcation was then obtained and subsequently the wire was reintroduced and the pigtail catheter negotiated into the SFA representing third order catheter placement. Distal runoff was then performed.  5000 units of heparin was then given and allowed to circulate and a 7 [place] Rabi sheath was advanced up and over the bifurcation and positioned in the left superficial femoral artery  The S6 Crosser catheter was then prepped on the field and a straight micro- catheter was advanced into the cul-de-sac of the perineal under magnified imaging in the LAO projection. Using the Crosser catheter the occlusion of the proximal peroneal was negotiated and hand injection of contrast was used to verify intraluminal placement distally.    A 3 x 1 50 mm all traverse balloon was used to angioplasty the peroneal artery. Inflation was to 12 atmospheres for 2 minutes. Follow-up imaging demonstrated patency with less than 5% residual stenosis compared to the occlusion that was noted on initial angiography.  The attention was then turned to the distal popliteal which was angioplastied initially with a 4 mm Lutonix and then a 5 x 80 mm  Lutonix. Both inflations were to 12 atm for 2 minutes. Follow-up imaging demonstrated the previously noted 70% stenosis is now less than 10% residual stenosis. The detector was then  repositioned to the midportion of the SFA near [name]'s canal oblique views of this region were obtained and demonstrated a 70% narrowing. This was treated with a 5 x 100 Lutonix balloon. Inflation was to 12 atm for 2 minutes. Follow-up imaging demonstrated less than 5% residual stenosis and preservation of distal runoff.  After review of these images the sheath is pulled into the right external iliac oblique of the common femoral is obtained and a Star close device deployed. There no immediate Complications.  Findings: The abdominal aorta is opacified with a bolus injection contrast. Renal arteries are single and widely patent. The aorta itself has diffuse disease but no hemodynamically significant lesions. The common and external iliac arteries are widely patent bilaterally.  The left common femoral is widely patent as is the profunda femoris.  The SFA does indeed have a significant stenosis in its midportion and the popliteal has a greater than 70% stenosis in its distal half.  As noted the distal popliteal demonstrates increasing disease and the trifurcation is heavily diseased with occlusion of the anterior tibial and the posterior tibial. The peroneal demonstrates an occlusion in its proximal half and then reconstitutes and is a good caliber vessel with extensive collaterals to the foot reconstituting the dorsalis pedis and filling the pedal arch.   Following angioplasty peroneal  now is in-line flow and looks quite nice. Angioplasty of the SFA at [name]'s canal and the distal popliteal yields an excellent result with less than 5% residual stenosis.   Disposition: Patient was taken to the recovery room in stable condition having tolerated the procedure well.  [name] 09/02/2016,11:39 AM

## 2016-09-02 NOTE — H&P (Signed)
[facility] History & Physical Update  The patient was interviewed and re-examined.  The patient's previous History and Physical has been reviewed and is unchanged.  There is no change in the plan of care. We plan to proceed with the scheduled procedure.  Hortencia Pilar, MD  09/02/2016, 11:38 AM

## 2016-09-04 ENCOUNTER — Encounter: Payer: Self-pay | Admitting: Vascular Surgery

## 2016-09-09 NOTE — H&P (Signed)
Patient has a complete H&P from my office dated 07/28/2016

## 2016-09-10 NOTE — H&P (Signed)
MRN : [id number]  [name] is a 83 y.o. (October 29, 1927) female who presents with chief complaint of     Chief Complaint  Patient presents with  . Follow-up  .  History of Present Illness: The patient is seen for evaluation of painful lower extremities and diminished pulses associated with ulceration of several toes on both feet.  The patient notes the ulcer has been present for multiple weeks and has not been improving.  It is very painful but has not had any drainage.  No specific history of trauma noted by the patient.  The patient denies fever or chills.  the patient does have diabetes which has been difficult to control.  Patient notes prior to the ulcer developing the extremities were painful particularly with ambulation or activity and the discomfort is very consistent day today. The pain has been progressive over the past several years.   The patient denies rest pain or dangling of an extremity off the side of the bed during the night for relief. No prior interventions or surgeries.  No history of back problems or DJD of the lumbar sacral spine.   The patient denies amaurosis fugax or recent TIA symptoms. There are no recent neurological changes noted. The patient denies history of DVT, PE or superficial thrombophlebitis. The patient denies recent episodes of angina or shortness of breath.   Active Medications      Current Meds  Medication Sig  . allopurinol (ZYLOPRIM) 100 MG tablet Take 100 mg by mouth daily.  Marland Kitchen amitriptyline (ELAVIL) 25 MG tablet Take 25 mg by mouth at bedtime.  Marland Kitchen aspirin EC 81 MG EC tablet Take 1 tablet (81 mg total) by mouth daily.  Marland Kitchen atorvastatin (LIPITOR) 40 MG tablet Take 1 tablet (40 mg total) by mouth daily at 6 PM.  . Cholecalciferol (VITAMIN D-3) 1000 UNITS CAPS Take 2,000 Units by mouth daily.   . clopidogrel (PLAVIX) 75 MG tablet Take 75 mg by mouth daily with breakfast.  . DULoxetine (CYMBALTA) 60 MG capsule Take 60 mg by mouth  daily.  . furosemide (LASIX) 20 MG tablet Take 1 tablet (20 mg total) by mouth every other day.  . gabapentin (NEURONTIN) 100 MG capsule Take 100 mg by mouth 3 (three) times daily. Take 1 cap (100mg ) in morning, 3 caps (300mg ) at 1400 and 2100  . HYDROcodone-acetaminophen (NORCO/VICODIN) 5-325 MG tablet Take 1 tablet by mouth every 6 (six) hours as needed for moderate pain.  Marland Kitchen levothyroxine (SYNTHROID, LEVOTHROID) 25 MCG tablet Take 25 mcg by mouth daily before breakfast.  . metoprolol succinate (TOPROL-XL) 25 MG 24 hr tablet Take 25 mg by mouth daily.  . montelukast (SINGULAIR) 10 MG tablet Take 10 mg by mouth daily.  . pantoprazole (PROTONIX) 40 MG tablet Take 40 mg by mouth daily.  . Probiotic Product (RISA-BID PROBIOTIC) TABS Take 1 tablet by mouth daily.  Orlie Dakin Sodium (SENNA) 8.6-50 MG TABS Take 1 tablet by mouth every three (3) days as needed (for no bowel movement (constipation)).   . topiramate (TOPAMAX) 25 MG tablet Take 25 mg by mouth at bedtime.  . traMADol (ULTRAM) 50 MG tablet Take 50 mg by mouth every 8 (eight) hours as needed (for pain control).   . ursodiol (ACTIGALL) 300 MG capsule Take 300 mg by mouth 2 (two) times daily.  . vitamin B-12 (CYANOCOBALAMIN) 1000 MCG tablet Take 1,000 mcg by mouth daily.          Past Medical History:  Diagnosis Date  .  Anxiety   . Arthritis   . Asthma   . Calculus of gallbladder without mention of cholecystitis, with obstruction   . Candidiasis of the esophagus   . Chronic kidney disease, stage III (moderate)   . Chronic pain syndrome   . Corns and callosities   . Depression   . Emphysema lung ([place])   . Esophageal reflux   . Essential hypertension, benign   . Gout, unspecified   . Hypertension   . Hypoglycemia, unspecified   . Hypopotassemia   . Migraine, unspecified, without mention of intractable migraine without mention of status migrainosus   . Other vitamin B12 deficiency anemia   .  Personal history of fall   . Unspecified asthma(493.90)   . Unspecified constipation   . Unspecified hypertensive heart disease with heart failure(402.91)   . Unspecified vitamin D deficiency          Past Surgical History:  Procedure Laterality Date  . ABDOMINAL HYSTERECTOMY    . arterial thrombi stents in both legs    . BACK SURGERY    . COLONOSCOPY    . ESOPHAGOGASTRODUODENOSCOPY (EGD) WITH PROPOFOL N/A 06/19/2016   Procedure: ESOPHAGOGASTRODUODENOSCOPY (EGD) WITH PROPOFOL;  Surgeon: Lollie Sails, MD;  Location: [facility]COPY;  Service: Endoscopy;  Laterality: N/A;  . EYE SURGERY     cataract extraction    Social History     Social History  Substance Use Topics  . Smoking status: Never Smoker  . Smokeless tobacco: Never Used  . Alcohol use No    Family History      Family History  Problem Relation Age of Onset  . Cancer Father   . Stroke Father   No family history of bleeding/clotting disorders, porphyria or autoimmune disease   No Known Allergies   REVIEW OF SYSTEMS (Negative unless checked)  Constitutional: [] Weight loss  [] Fever  [] Chills Cardiac: [] Chest pain   [] Chest pressure   [] Palpitations   [] Shortness of breath when laying flat   [] Shortness of breath with exertion. Vascular:  [] Pain in legs with walking   [x] Pain in legs at rest  [] History of DVT   [] Phlebitis   [] Swelling in legs   [] Varicose veins   [x] Non-healing ulcers Pulmonary:   [] Uses home oxygen   [] Productive cough   [] Hemoptysis   [] Wheeze  [] COPD   [] Asthma Neurologic:  [] Dizziness   [] Seizures   [] History of stroke   [] History of TIA  [] Aphasia   [] Vissual changes   [] Weakness or numbness in arm   [x] Weakness or numbness in leg Musculoskeletal:   [] Joint swelling   [] Joint pain   [] Low back pain Hematologic:  [] Easy bruising  [] Easy bleeding   [] Hypercoagulable state   [] Anemic Gastrointestinal:  [] Diarrhea   [] Vomiting  [] Gastroesophageal  reflux/heartburn   [] Difficulty swallowing. Genitourinary:  [x] Chronic kidney disease   [] Difficult urination  [] Frequent urination   [] Blood in urine Skin:  [] Rashes   [] Ulcers  Psychological:  [] History of anxiety   []  History of major depression.  Physical Examination     Vitals:   07/28/16 1613  BP: (!) 100/56  Pulse: 63  Resp: 16  Weight: 83 kg (183 lb)   Body mass index is 30.45 kg/m. Gen: WD/WN, NAD Head: [place]/AT, No temporalis wasting.  Ear/Nose/Throat: Hearing grossly intact, nares w/o erythema or drainage, poor dentition Eyes: PER, EOMI, sclera nonicteric.  Neck: Supple, no masses.  No bruit or JVD.  Pulmonary:  Good air movement, clear to auscultation bilaterally, no use of  accessory muscles.  Cardiac: RRR, normal S1, S2, no Murmurs. Vascular:  There is dry gangrene of the left 2nd and 3rd toes on the dorsal surface and the right 2nd toe again dry; no odor no pus.  Both feet are cool to the touch with sluggish capillary refill. Vessel Right Left  Radial Palpable Palpable  Ulnar Palpable Palpable  Brachial Palpable Palpable  Carotid Palpable Palpable  Femoral Palpable Palpable  Popliteal Not Palpable Not Palpable  PT Not Palpable Not Palpable  DP Not Palpable Not Palpable   Gastrointestinal: soft, non-distended. No guarding/no peritoneal signs.  Musculoskeletal: M/S 5/5 throughout.  No deformity or atrophy.  Neurologic: CN 2-12 intact. Pain and light touch intact in extremities.  Symmetrical.  Speech is fluent. Motor exam as listed above. Psychiatric: Judgment intact, Mood & affect appropriate for pt's clinical situation. Dermatologic: No rashes or ulcers noted.  No changes consistent with cellulitis. Lymph : No Cervical lymphadenopathy, no lichenification or skin changes of chronic lymphedema.  CBC Recent Labs       Lab Results  Component Value Date   WBC 9.2 07/20/2016   HGB 12.0 07/20/2016   HCT 36.2 07/20/2016   MCV 94.3 07/20/2016   PLT 228  07/20/2016      BMET Labs (Brief)          Component Value Date/Time   NA 138 07/20/2016 1345   NA 139 09/22/2014 0521   K 4.3 07/20/2016 1345   K 4.4 09/22/2014 0521   CL 104 07/20/2016 1345   CL 109 (H) 09/22/2014 0521   CO2 27 07/20/2016 1345   CO2 24 09/22/2014 0521   GLUCOSE 90 07/20/2016 1345   GLUCOSE 83 09/22/2014 0521   BUN 17 07/20/2016 1345   BUN 9 09/22/2014 0521   CREATININE 1.36 (H) 07/20/2016 1345   CREATININE 0.97 09/22/2014 0521   CALCIUM 9.0 07/20/2016 1345   CALCIUM 9.0 09/22/2014 0521   GFRNONAA 34 (L) 07/20/2016 1345   GFRNONAA 58 (L) 09/22/2014 0521   GFRNONAA 43 (L) 02/27/2013 1139   GFRAA 40 (L) 07/20/2016 1345   GFRAA >60 09/22/2014 0521   GFRAA 50 (L) 02/27/2013 1139     Estimated Creatinine Clearance: 31.6 mL/min (by C-G formula based on SCr of 1.36 mg/dL (H)).  COAG Recent Labs       Lab Results  Component Value Date   INR 1.12 07/20/2016   INR 1.04 06/19/2016      Radiology  Imaging Results  Ct Head Wo Contrast  Result Date: 07/20/2016 CLINICAL DATA:  Right-sided weakness. EXAM: CT HEAD WITHOUT CONTRAST TECHNIQUE: Contiguous axial images were obtained from the base of the skull through the vertex without intravenous contrast. COMPARISON:  None. FINDINGS: Brain: No subdural, epidural, or subarachnoid hemorrhage. Ventricles and sulci are stable. Multiple infarcts in the cerebellar hemispheres are stable. Possible lacunar infarcts in the brainstem as well. Basal cisterns are patent. White matter changes are stable. No mass, mass effect, or midline shift. No acute cortical ischemia or infarct. Vascular: Calcified atherosclerotic changes seen in the intracranial carotid arteries. Skull: Normal. Negative for fracture or focal lesion. Sinuses/Orbits: No acute finding. Other: None. IMPRESSION: No acute intracranial abnormality identified. Chronic changes as above. Electronically Signed   By: Dorise Bullion III M.D    On: 07/20/2016 13:20   Mr Jodene Nam Head Wo Contrast  Result Date: 07/21/2016 CLINICAL DATA:  New onset right-sided weakness beginning 2 days ago. Symptoms were preceded by dizziness. EXAM: MRI HEAD WITHOUT CONTRAST MRA HEAD WITHOUT  CONTRAST TECHNIQUE: Multiplanar, multiecho pulse sequences of the brain and surrounding structures were obtained without intravenous contrast. Angiographic images of the head were obtained using MRA technique without contrast. COMPARISON:  CT head without contrast 07/20/2016 FINDINGS: MRI HEAD FINDINGS Brain: The diffusion-weighted images demonstrate acute/ subacute nonhemorrhagic left pontine infarct measuring 16 mm in maximal dimension. No acute supratentorial infarct is present. T2 changes are associated with the infarct compatible with the subacute time frame. Remote infarcts are present in the inferior cerebellum bilaterally. Moderate periventricular and subcortical T2 changes are present bilaterally. A remote lacunar infarct is present in the left lentiform nucleus. Remote lacunar infarcts are present in the left thalamus. No acute hemorrhage or mass lesion is present. The ventricles are of normal size. No significant extra-axial fluid collection is present. Vascular: The flow is present in the major intracranial arteries. Skull and upper cervical spine: The skullbase is within normal limits. Midline intracranial sagittal structures are normal. Degenerative changes are present in the cervical spine with central canal narrowing at C3-4. Marrow signal is normal. Sinuses/Orbits: Mild mucosal thickening is present along the floor of the right maxillary sinus. The paranasal sinuses and mastoid air cells are otherwise clear. Bilateral lens replacements are noted. The globes and orbits are otherwise intact. MRA HEAD FINDINGS Moderate atherosclerotic irregularity is present within the cavernous internal carotid arteries bilaterally without a significant stenosis relative to the more  distal vessel. There is mild narrowing of the distal right A1 segment. The left A1 segment is normal. Atherosclerotic irregularity is present in the M1 segments bilaterally without a significant stenosis. The MCA bifurcations are intact. There is moderate attenuation of MCA branch vessels, left greater than right. Next The left vertebral artery is dominant. Atherosclerotic irregularity is present in the distal vertebral arteries bilaterally. PICA origins are visualized and normal. The vertebrobasilar junction is normal. The left posterior cerebral artery originates from the basilar tip. The right posterior cerebral artery is of fetal type. There is moderate attenuation of distal PCA branch vessels bilaterally. IMPRESSION: 1. Acute/subacute nonhemorrhagic left pontine infarct. 2. Remote bilateral posterior cerebellar infarcts. 3. Moderate atrophy and white matter disease compatible with chronic microvascular ischemia. 4. MRA circle of Willis demonstrate significant medium and distal small vessel disease without a significant proximal stenosis or aneurysm. 5. Atherosclerotic changes within the cavernous internal carotid arteries and right greater than left A1 segments without a significant stenosis. 6. Minimal right maxillary sinus disease. Electronically Signed   By: San Morelle M.D.   On: 07/21/2016 14:36   Mr Brain Wo Contrast  Result Date: 07/21/2016 CLINICAL DATA:  New onset right-sided weakness beginning 2 days ago. Symptoms were preceded by dizziness. EXAM: MRI HEAD WITHOUT CONTRAST MRA HEAD WITHOUT CONTRAST TECHNIQUE: Multiplanar, multiecho pulse sequences of the brain and surrounding structures were obtained without intravenous contrast. Angiographic images of the head were obtained using MRA technique without contrast. COMPARISON:  CT head without contrast 07/20/2016 FINDINGS: MRI HEAD FINDINGS Brain: The diffusion-weighted images demonstrate acute/ subacute nonhemorrhagic left pontine infarct  measuring 16 mm in maximal dimension. No acute supratentorial infarct is present. T2 changes are associated with the infarct compatible with the subacute time frame. Remote infarcts are present in the inferior cerebellum bilaterally. Moderate periventricular and subcortical T2 changes are present bilaterally. A remote lacunar infarct is present in the left lentiform nucleus. Remote lacunar infarcts are present in the left thalamus. No acute hemorrhage or mass lesion is present. The ventricles are of normal size. No significant extra-axial fluid collection is present.  Vascular: The flow is present in the major intracranial arteries. Skull and upper cervical spine: The skullbase is within normal limits. Midline intracranial sagittal structures are normal. Degenerative changes are present in the cervical spine with central canal narrowing at C3-4. Marrow signal is normal. Sinuses/Orbits: Mild mucosal thickening is present along the floor of the right maxillary sinus. The paranasal sinuses and mastoid air cells are otherwise clear. Bilateral lens replacements are noted. The globes and orbits are otherwise intact. MRA HEAD FINDINGS Moderate atherosclerotic irregularity is present within the cavernous internal carotid arteries bilaterally without a significant stenosis relative to the more distal vessel. There is mild narrowing of the distal right A1 segment. The left A1 segment is normal. Atherosclerotic irregularity is present in the M1 segments bilaterally without a significant stenosis. The MCA bifurcations are intact. There is moderate attenuation of MCA branch vessels, left greater than right. Next The left vertebral artery is dominant. Atherosclerotic irregularity is present in the distal vertebral arteries bilaterally. PICA origins are visualized and normal. The vertebrobasilar junction is normal. The left posterior cerebral artery originates from the basilar tip. The right posterior cerebral artery is of fetal  type. There is moderate attenuation of distal PCA branch vessels bilaterally. IMPRESSION: 1. Acute/subacute nonhemorrhagic left pontine infarct. 2. Remote bilateral posterior cerebellar infarcts. 3. Moderate atrophy and white matter disease compatible with chronic microvascular ischemia. 4. MRA circle of Willis demonstrate significant medium and distal small vessel disease without a significant proximal stenosis or aneurysm. 5. Atherosclerotic changes within the cavernous internal carotid arteries and right greater than left A1 segments without a significant stenosis. 6. Minimal right maxillary sinus disease. Electronically Signed   By: San Morelle M.D.   On: 07/21/2016 14:36   US Carotid Bilateral  Result Date: 07/21/2016 CLINICAL DATA:  Stroke. EXAM: BILATERAL CAROTID DUPLEX ULTRASOUND TECHNIQUE: Pearline Cables scale imaging, color Doppler and duplex ultrasound were performed of bilateral carotid and vertebral arteries in the neck. COMPARISON:  MRI 07/21/2016. FINDINGS: Criteria: Quantification of carotid stenosis is based on velocity parameters that correlate the residual internal carotid diameter with NASCET-based stenosis levels, using the diameter of the distal internal carotid lumen as the denominator for stenosis measurement. The following velocity measurements were obtained: RIGHT ICA:  80/13 cm/sec CCA:  86/57 cm/sec SYSTOLIC ICA/CCA RATIO:  1.1 DIASTOLIC ICA/CCA RATIO:  1.3 ECA:  6 cm/sec LEFT ICA:  51/15 cm/sec CCA:  84/6 cm/sec SYSTOLIC ICA/CCA RATIO:  0.6 DIASTOLIC ICA/CCA RATIO:  2.0 ECA:  21 cm/sec RIGHT CAROTID ARTERY: Diffuse right common carotid, carotid bifurcation, and proximal ICA atherosclerotic vascular plaque. No flow limiting stenosis. Degree of stenosis less than 50%. RIGHT VERTEBRAL ARTERY:  Patent with antegrade flow. LEFT CAROTID ARTERY: Mild diffuse left common carotid, carotid bifurcation, proximal ICA atherosclerotic vascular plaque. No flow limiting stenosis. Degree of stenosis  less than 50% . LEFT VERTEBRAL ARTERY:  Patent antegrade flow . IMPRESSION: 1. Mild bilateral diffuse carotid atherosclerotic vascular disease. No flow limiting stenosis. Degree of stenosis less than 50% bilaterally. 2. Vertebral artery patent antegrade flow. Electronically Signed   By: [place]   On: 07/21/2016 16:29     Assessment/Plan 1. 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  This represents a limb threatening ischemia and places the patient at the risk for limb loss.  Patient should undergo angiography of the lower extremities  with the hope for intervention for limb salvage.  The risks and benefits as well as the alternative therapies was discussed in detail with the patient.  All questions were answered.  Patient agrees to proceed with angiography.  Given that the gangrenous changes are more prominent on the left I will address the left leg angiogram first and then the right leg.  The patient will follow up with me in the office after the procedure.   2. Essential hypertension Continue antihypertensive medications as already ordered and reviewed, no changes at this time.  3. Cerebrovascular accident (CVA) due to embolism of left cerebellar artery (HCC) Continue antiplatelet medications and the statin as ordered and reviewed, no changes at this time  Hortencia Pilar, MD  07/28/2016 4:42 PM      Electronically signed by Katha Cabal, MD at 1/16/20187:20 PM

## 2016-09-15 ENCOUNTER — Encounter
Admission: RE | Admit: 2016-09-15 | Discharge: 2016-09-15 | Disposition: A | Discharge status: Home / Self Care | Payer: Medicare Other | Source: Ambulatory Visit | Attending: Vascular Surgery | Admitting: Vascular Surgery

## 2016-09-15 ENCOUNTER — Other Ambulatory Visit (INDEPENDENT_AMBULATORY_CARE_PROVIDER_SITE_OTHER): Payer: Self-pay | Admitting: Vascular Surgery

## 2016-09-15 DIAGNOSIS — E876 Hypokalemia: Secondary | ICD-10-CM | POA: Diagnosis not present

## 2016-09-15 DIAGNOSIS — Z90710 Acquired absence of both cervix and uterus: Secondary | ICD-10-CM | POA: Diagnosis not present

## 2016-09-15 DIAGNOSIS — N183 Chronic kidney disease, stage 3 (moderate): Secondary | ICD-10-CM | POA: Diagnosis not present

## 2016-09-15 DIAGNOSIS — E162 Hypoglycemia, unspecified: Secondary | ICD-10-CM | POA: Diagnosis not present

## 2016-09-15 DIAGNOSIS — G43909 Migraine, unspecified, not intractable, without status migrainosus: Secondary | ICD-10-CM | POA: Diagnosis not present

## 2016-09-15 DIAGNOSIS — E538 Deficiency of other specified B group vitamins: Secondary | ICD-10-CM | POA: Diagnosis not present

## 2016-09-15 DIAGNOSIS — Z823 Family history of stroke: Secondary | ICD-10-CM | POA: Diagnosis not present

## 2016-09-15 DIAGNOSIS — Z9849 Cataract extraction status, unspecified eye: Secondary | ICD-10-CM | POA: Diagnosis not present

## 2016-09-15 DIAGNOSIS — J45909 Unspecified asthma, uncomplicated: Secondary | ICD-10-CM | POA: Diagnosis not present

## 2016-09-15 DIAGNOSIS — K219 Gastro-esophageal reflux disease without esophagitis: Secondary | ICD-10-CM | POA: Diagnosis not present

## 2016-09-15 DIAGNOSIS — I129 Hypertensive chronic kidney disease with stage 1 through stage 4 chronic kidney disease, or unspecified chronic kidney disease: Secondary | ICD-10-CM | POA: Diagnosis not present

## 2016-09-15 DIAGNOSIS — M109 Gout, unspecified: Secondary | ICD-10-CM | POA: Diagnosis not present

## 2016-09-15 DIAGNOSIS — Z809 Family history of malignant neoplasm, unspecified: Secondary | ICD-10-CM | POA: Diagnosis not present

## 2016-09-15 DIAGNOSIS — I63442 Cerebral infarction due to embolism of left cerebellar artery: Secondary | ICD-10-CM | POA: Diagnosis not present

## 2016-09-15 DIAGNOSIS — E559 Vitamin D deficiency, unspecified: Secondary | ICD-10-CM | POA: Diagnosis not present

## 2016-09-15 DIAGNOSIS — J439 Emphysema, unspecified: Secondary | ICD-10-CM | POA: Diagnosis not present

## 2016-09-15 DIAGNOSIS — M1990 Unspecified osteoarthritis, unspecified site: Secondary | ICD-10-CM | POA: Diagnosis not present

## 2016-09-15 DIAGNOSIS — L97919 Non-pressure chronic ulcer of unspecified part of right lower leg with unspecified severity: Secondary | ICD-10-CM | POA: Diagnosis not present

## 2016-09-15 DIAGNOSIS — I70263 Atherosclerosis of native arteries of extremities with gangrene, bilateral legs: Secondary | ICD-10-CM | POA: Diagnosis present

## 2016-09-15 HISTORY — DX: Hyperglycemia, unspecified: R73.9

## 2016-09-15 HISTORY — DX: Dyspnea, unspecified: R06.00

## 2016-09-15 HISTORY — DX: Unspecified glaucoma: H40.9

## 2016-09-15 HISTORY — DX: Disorder of thyroid, unspecified: E07.9

## 2016-09-15 LAB — CREATININE, SERUM
CREATININE: 1.23 mg/dL — AB (ref 0.44–1.00)
GFR calc Af Amer: 45 mL/min — ABNORMAL LOW (ref >60)
GFR calc non Af Amer: 39 mL/min — ABNORMAL LOW (ref >60)

## 2016-09-15 LAB — BUN: BUN: 11 mg/dL (ref 6–20)

## 2016-09-15 NOTE — Pre-Procedure Instructions (Signed)
Patient arrived at 8:00 am to pre-op for angiogram procedure to be performed on January 30. Notified Dr. Delana Meyer office that pre-op orders needed (spoke to April and Ebony).

## 2016-09-16 ENCOUNTER — Ambulatory Visit
Admission: RE | Admit: 2016-09-16 | Discharge: 2016-09-16 | Disposition: A | Discharge status: Home / Self Care | Payer: Medicare Other | Source: Ambulatory Visit | Attending: Vascular Surgery | Admitting: Vascular Surgery

## 2016-09-16 ENCOUNTER — Encounter
Admission: RE | Disposition: A | Discharge status: Home / Self Care | Payer: Self-pay | Source: Ambulatory Visit | Attending: Vascular Surgery

## 2016-09-16 DIAGNOSIS — M109 Gout, unspecified: Secondary | ICD-10-CM | POA: Insufficient documentation

## 2016-09-16 DIAGNOSIS — Z90710 Acquired absence of both cervix and uterus: Secondary | ICD-10-CM | POA: Insufficient documentation

## 2016-09-16 DIAGNOSIS — I70263 Atherosclerosis of native arteries of extremities with gangrene, bilateral legs: Secondary | ICD-10-CM | POA: Insufficient documentation

## 2016-09-16 DIAGNOSIS — Z9849 Cataract extraction status, unspecified eye: Secondary | ICD-10-CM | POA: Insufficient documentation

## 2016-09-16 DIAGNOSIS — L97919 Non-pressure chronic ulcer of unspecified part of right lower leg with unspecified severity: Secondary | ICD-10-CM | POA: Insufficient documentation

## 2016-09-16 DIAGNOSIS — J45909 Unspecified asthma, uncomplicated: Secondary | ICD-10-CM | POA: Insufficient documentation

## 2016-09-16 DIAGNOSIS — E559 Vitamin D deficiency, unspecified: Secondary | ICD-10-CM | POA: Insufficient documentation

## 2016-09-16 DIAGNOSIS — K219 Gastro-esophageal reflux disease without esophagitis: Secondary | ICD-10-CM | POA: Insufficient documentation

## 2016-09-16 DIAGNOSIS — N183 Chronic kidney disease, stage 3 (moderate): Secondary | ICD-10-CM | POA: Insufficient documentation

## 2016-09-16 DIAGNOSIS — Z809 Family history of malignant neoplasm, unspecified: Secondary | ICD-10-CM | POA: Insufficient documentation

## 2016-09-16 DIAGNOSIS — I63442 Cerebral infarction due to embolism of left cerebellar artery: Secondary | ICD-10-CM | POA: Insufficient documentation

## 2016-09-16 DIAGNOSIS — E162 Hypoglycemia, unspecified: Secondary | ICD-10-CM | POA: Insufficient documentation

## 2016-09-16 DIAGNOSIS — M1990 Unspecified osteoarthritis, unspecified site: Secondary | ICD-10-CM | POA: Insufficient documentation

## 2016-09-16 DIAGNOSIS — Z823 Family history of stroke: Secondary | ICD-10-CM | POA: Insufficient documentation

## 2016-09-16 DIAGNOSIS — I129 Hypertensive chronic kidney disease with stage 1 through stage 4 chronic kidney disease, or unspecified chronic kidney disease: Secondary | ICD-10-CM | POA: Insufficient documentation

## 2016-09-16 DIAGNOSIS — J439 Emphysema, unspecified: Secondary | ICD-10-CM | POA: Insufficient documentation

## 2016-09-16 DIAGNOSIS — E538 Deficiency of other specified B group vitamins: Secondary | ICD-10-CM | POA: Insufficient documentation

## 2016-09-16 DIAGNOSIS — E876 Hypokalemia: Secondary | ICD-10-CM | POA: Insufficient documentation

## 2016-09-16 DIAGNOSIS — G43909 Migraine, unspecified, not intractable, without status migrainosus: Secondary | ICD-10-CM | POA: Insufficient documentation

## 2016-09-16 DIAGNOSIS — I70261 Atherosclerosis of native arteries of extremities with gangrene, right leg: Secondary | ICD-10-CM | POA: Diagnosis not present

## 2016-09-16 HISTORY — PX: PERIPHERAL VASCULAR CATHETERIZATION: SHX172C

## 2016-09-16 SURGERY — LOWER EXTREMITY ANGIOGRAPHY
Anesthesia: Moderate Sedation | Site: Leg Lower | Laterality: Right

## 2016-09-16 MED ORDER — HEPARIN SODIUM (PORCINE) 1000 UNIT/ML IJ SOLN
INTRAMUSCULAR | Status: AC
  Filled 2016-09-16: qty 1

## 2016-09-16 MED ORDER — ACETAMINOPHEN 325 MG RE SUPP
325.0000 mg | RECTAL | Status: DC | PRN
  Filled 2016-09-16: qty 2

## 2016-09-16 MED ORDER — FAMOTIDINE 20 MG PO TABS: 40.0000 mg | ORAL_TABLET | ORAL | Status: DC | PRN

## 2016-09-16 MED ORDER — LABETALOL HCL 5 MG/ML IV SOLN: 10.0000 mg | INTRAVENOUS | Status: DC | PRN

## 2016-09-16 MED ORDER — ACETAMINOPHEN 325 MG PO TABS: 325.0000 mg | ORAL_TABLET | ORAL | Status: DC | PRN

## 2016-09-16 MED ORDER — CEFAZOLIN IN D5W 1 GM/50ML IV SOLN
1.0000 g | Freq: Once | INTRAVENOUS | Status: AC
  Administered 2016-09-16: 1 g via INTRAVENOUS

## 2016-09-16 MED ORDER — GUAIFENESIN-DM 100-10 MG/5ML PO SYRP: 15.0000 mL | ORAL_SOLUTION | ORAL | Status: DC | PRN

## 2016-09-16 MED ORDER — FENTANYL CITRATE (PF) 100 MCG/2ML IJ SOLN
INTRAMUSCULAR | Status: AC
  Filled 2016-09-16: qty 2

## 2016-09-16 MED ORDER — ONDANSETRON HCL 4 MG/2ML IJ SOLN: 4.0000 mg | Freq: Four times a day (QID) | INTRAMUSCULAR | Status: DC | PRN

## 2016-09-16 MED ORDER — HYDRALAZINE HCL 20 MG/ML IJ SOLN: 5.0000 mg | INTRAMUSCULAR | Status: DC | PRN

## 2016-09-16 MED ORDER — ALUM & MAG HYDROXIDE-SIMETH 200-200-20 MG/5ML PO SUSP: 15.0000 mL | ORAL | Status: DC | PRN

## 2016-09-16 MED ORDER — HYDROMORPHONE HCL 1 MG/ML IJ SOLN: 0.5000 mg | INTRAMUSCULAR | Status: DC | PRN

## 2016-09-16 MED ORDER — METOPROLOL TARTRATE 5 MG/5ML IV SOLN: 2.0000 mg | INTRAVENOUS | Status: DC | PRN

## 2016-09-16 MED ORDER — SODIUM CHLORIDE 0.9 % IV SOLN: 500.0000 mL | Freq: Once | INTRAVENOUS | Status: DC | PRN

## 2016-09-16 MED ORDER — SODIUM CHLORIDE 0.9 % IV SOLN: 1.0000 mL/kg/h | INTRAVENOUS | Status: DC

## 2016-09-16 MED ORDER — MIDAZOLAM HCL 2 MG/2ML IJ SOLN
INTRAMUSCULAR | Status: DC | PRN
  Administered 2016-09-16 (×2): 1 mg via INTRAVENOUS

## 2016-09-16 MED ORDER — SODIUM CHLORIDE 0.9 % IV SOLN
INTRAVENOUS | Status: DC
  Administered 2016-09-16: 08:00:00 via INTRAVENOUS

## 2016-09-16 MED ORDER — OXYCODONE HCL 5 MG PO TABS
5.0000 mg | ORAL_TABLET | ORAL | Status: DC | PRN
  Filled 2016-09-16: qty 2

## 2016-09-16 MED ORDER — HYDROMORPHONE HCL 1 MG/ML IJ SOLN: 1.0000 mg | Freq: Once | INTRAMUSCULAR | Status: DC

## 2016-09-16 MED ORDER — PHENOL 1.4 % MT LIQD: 1.0000 | OROMUCOSAL | Status: DC | PRN

## 2016-09-16 MED ORDER — HEPARIN SODIUM (PORCINE) 1000 UNIT/ML IJ SOLN
INTRAMUSCULAR | Status: DC | PRN
  Administered 2016-09-16: 5000 [IU] via INTRAVENOUS

## 2016-09-16 MED ORDER — LIDOCAINE HCL (PF) 1 % IJ SOLN
INTRAMUSCULAR | Status: AC
  Filled 2016-09-16: qty 30

## 2016-09-16 MED ORDER — HEPARIN (PORCINE) IN NACL 2-0.9 UNIT/ML-% IJ SOLN
INTRAMUSCULAR | Status: AC
  Filled 2016-09-16: qty 1000

## 2016-09-16 MED ORDER — FENTANYL CITRATE (PF) 100 MCG/2ML IJ SOLN
INTRAMUSCULAR | Status: DC | PRN
  Administered 2016-09-16 (×2): 25 ug via INTRAVENOUS

## 2016-09-16 MED ORDER — METHYLPREDNISOLONE SODIUM SUCC 125 MG IJ SOLR: 125.0000 mg | INTRAMUSCULAR | Status: DC | PRN

## 2016-09-16 MED ORDER — MIDAZOLAM HCL 5 MG/5ML IJ SOLN
INTRAMUSCULAR | Status: AC
  Filled 2016-09-16: qty 5

## 2016-09-16 SURGICAL SUPPLY — 23 items
BALLN LUTONIX DCB 5X60X130 (BALLOONS) ×3
BALLOON LUTONIX DCB 5X60X130 (BALLOONS) ×1 IMPLANT
CATH CROSSER S6 154CM (CATHETERS) ×3 IMPLANT
CATH CXI SUPP ANG 2.6FR 150CM (MICROCATHETER) ×3 IMPLANT
CATH PIG 70CM (CATHETERS) ×3 IMPLANT
CATH RIM 65CM (CATHETERS) ×3 IMPLANT
CATH STS 5FR 125CM (CATHETERS) ×3 IMPLANT
CATH USHER ANGLED 130CM (CATHETERS) ×3 IMPLANT
COVER DRAPE FLUORO 36X44 (DRAPES) ×3 IMPLANT
DEVICE PRESTO INFLATION (MISCELLANEOUS) ×3 IMPLANT
DEVICE STARCLOSE SE CLOSURE (Vascular Products) ×3 IMPLANT
GLIDEWIRE ANGLED SS 035X260CM (WIRE) ×3 IMPLANT
GUIDEWIRE PFTE-COATED .018X300 (WIRE) ×3 IMPLANT
KIT FLOWMATE PROCEDURAL (MISCELLANEOUS) ×3 IMPLANT
PACK ANGIOGRAPHY (CUSTOM PROCEDURE TRAY) ×3 IMPLANT
SET INTRO CAPELLA COAXIAL (SET/KITS/TRAYS/PACK) ×6 IMPLANT
SHEATH BRITE TIP 5FRX11 (SHEATH) ×3 IMPLANT
SHEATH RAABE 6FR (SHEATH) ×3 IMPLANT
SYR MEDRAD MARK V 150ML (SYRINGE) ×3 IMPLANT
TUBING CONTRAST HIGH PRESS 72 (TUBING) ×3 IMPLANT
WIRE G V18X300CM (WIRE) ×3 IMPLANT
WIRE J 3MM .035X145CM (WIRE) ×3 IMPLANT
WIRE MAGIC TORQUE 260C (WIRE) ×3 IMPLANT

## 2016-09-16 NOTE — Progress Notes (Signed)
Discharge instructions given to pt and her caregiver. They are both very aware of instructions, since they have been through this procedure before. All questions answered, both states they understand. Follow up plan of care discussed. Pt stable. Refusing po at this time. Caregiver at bedside, family just left.

## 2016-09-16 NOTE — Progress Notes (Signed)
Pt sitting up eating lunch with po also. No co pain. Sl bloody drainage noted on edge of gauze star closure dressing site. No hematoma. No co pain.

## 2016-09-16 NOTE — Progress Notes (Signed)
Dr in to see pt and talk to pt and caregiver at this time.

## 2016-09-16 NOTE — Op Note (Signed)
[facility] Percutaneous Study/Intervention Procedural Note   Date of Surgery: 09/16/2016  Surgeon:  Katha Cabal, MD.  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  Post-operative diagnosis: Same  Procedure(s) Performed: 1. Introduction catheter into right lower extremity 3rd order catheter placement  2. Contrast injection right lower extremity for distal runoff   3. Percutaneous transluminal angioplasty  right superficial femoral artery-successful  4. Crosser atherectomy right peroneal artery-unsuccessful              5.  Star close closure left common femoral arteriotomy  Anesthesia: Conscious sedation was administered under my direct supervision. IV Versed plus fentanyl were utilized. Continuous ECG, pulse oximetry and blood pressure was monitored throughout the entire procedure.  Conscious sedation was for a total of 100 minutes.  Sheath: 6 [place] Rabi left common femoral retrograde  Contrast: 110 cc  Fluoroscopy Time: Team 0.1 minutes  Indications: Marlita Pearcy presents with atherosclerotic occlusive disease bilateral lower extremities with ulceration gangrene noted on both feet. She is status post successful treatment of the left and is now returning for treatment of the right. The risks and benefits are reviewed all questions answered patient agrees to proceed.  Procedure: Ajanae Henrikson is a 83 y.o. y.o. female who was identified and appropriate procedural time out was performed. The patient was then placed supine on the table and prepped and draped in the usual sterile fashion.   Ultrasound was placed in the sterile sleeve and the left groin was evaluated the left common femoral artery was echolucent and pulsatile indicating patency.  Image was recorded for the permanent record and under real-time visualization a  microneedle was inserted into the common femoral artery microwire followed by a micro-sheath.  A J-wire was then advanced through the micro-sheath and a  5 [place] sheath was then inserted over a J-wire. J-wire was then advanced and a rim catheter was introduced to hook the aortic bifurcation. Rim catheter was repositioned to above the bifurcation and a LAO view of the pelvis was obtained.  Subsequently a rim catheter with the stiff angle Glidewire was used to cross the aortic bifurcation the catheter wire were advanced down into the right distal external iliac artery. Oblique view of the femoral bifurcation was then obtained and subsequently the wire was reintroduced and the pigtail catheter negotiated into the SFA representing third order catheter placement. Distal runoff was then performed.  5000 units of heparin was then given and allowed to circulate and a 6 [place] Rabi sheath was advanced up and over the bifurcation and positioned in the femoral artery  Wire and catheter were then advanced down to the occlusion of the distal popliteal. The wire was then exchanged for a VAT 18 0.018 wire and the angled Usher catheter was advanced down to the cul-de-sac. The Crosser S6 device was then prepped on the back table and multiple attempts were made to cross the occlusion of the peroneal artery. Unfortunately intralumenal reentry was not achieved. Attention was then turned to the SFA lesion.  A 260 Magic torque wire was then introduced and the lesion in the mid SFA localized and a magnified imaging. The wire was then reintroduced and a 5 x 60 mm Lutonix balloon was used to angioplasty the superficial femoral and popliteal arteries. Inflations were to 12 atmospheres for 2 minutes. Follow-up imaging demonstrated patency with less than 10% residual stenosis. Distal runoff was then reassessed and found to be unchanged.  After review of  these images the sheath is pulled into the left external iliac oblique of the  common femoral is obtained and a Star close device deployed. There no immediate complications.   Findings:  The right common and external iliac arteries are widely patent bilaterally.  The right common femoral is widely patent as is the profunda femoris.  The SFA does indeed have a significant stenosis 80%.  The distal popliteal demonstrates increasing disease and the trifurcation is occluded. The occlusion of the anterior tibial artery extends down to the dorsalis pedis. The occlusion of the peroneal is present in the proximal half with reconstitution of the distal half. The posterior tibial demonstrates a total occlusion throughout its entire course. There is no reconstitution of the lateral plantar vessels.  Following angioplasty with a Lutonix balloon to 5 mm  the SFA is now patent with in-line flow and looks quite nice.     Summary: Successful recanalization right SFA for attempted right lower extremity for limb salvage    Disposition: Patient was taken to the recovery room in stable condition having tolerated the procedure well.  Deashia Soule, Dolores Lory 09/16/2016,10:21 AM

## 2016-09-16 NOTE — Progress Notes (Signed)
Pt tol proc well. Waiting to speak with Dr. Sitting up in chair with caregiver.

## 2016-09-16 NOTE — H&P (Signed)
[facility] History & Physical Update  The patient was interviewed and re-examined.  The patient's previous History and Physical has been reviewed and is unchanged.  There is no change in the plan of care. We plan to proceed with the scheduled procedure.  Hortencia Pilar, MD  09/16/2016, 7:53 AM

## 2016-09-16 NOTE — Discharge Instructions (Signed)
Angiogram, Care After °These instructions give you information about caring for yourself after your procedure. Your doctor may also give you more specific instructions. Call your doctor if you have any problems or questions after your procedure. °Follow these instructions at home: °· Take medicines only as told by your doctor. °· Follow your doctor's instructions about: °¨ Care of the area where the tube was inserted. °¨ Bandage (dressing) changes and removal. °· You may shower 24-48 hours after the procedure or as told by your doctor. °· Do not take baths, swim, or use a hot tub until your doctor approves. °· Every day, check the area where the tube was inserted. Watch for: °¨ Redness, swelling, or pain. °¨ Fluid, blood, or pus. °· Do not apply powder or lotion to the site. °· Do not lift anything that is heavier than 10 lb (4.5 kg) for 5 days or as told by your doctor. °· Ask your doctor when you can: °¨ Return to work or school. °¨ Do physical activities or play sports. °¨ Have sex. °· Do not drive or operate heavy machinery for 24 hours or as told by your doctor. °· Have someone with you for the first 24 hours after the procedure. °· Keep all follow-up visits as told by your doctor. This is important. °Contact a health care provider if: °· You have a fever. °· You have chills. °· You have more bleeding from the area where the tube was inserted. Hold pressure on the area. °· You have redness, swelling, or pain in the area where the tube was inserted. °· You have fluid or pus coming from the area. °Get help right away if: °· You have a lot of pain in the area where the tube was inserted. °· The area where the tube was inserted is bleeding, and the bleeding does not stop after 30 minutes of holding steady pressure on the area. °· The area near or just beyond the insertion site becomes pale, cool, tingly, or numb. °This information is not intended to replace advice given to you by your health care provider. Make  sure you discuss any questions you have with your health care provider. °Document Released: 10/31/2008 Document Revised: 01/10/2016 Document Reviewed: 01/05/2013 °Elsevier Interactive Patient Education © 2017 [employer] ° °

## 2016-09-16 NOTE — Progress Notes (Signed)
Pt stable home. With caregiver.

## 2016-09-17 ENCOUNTER — Telehealth (INDEPENDENT_AMBULATORY_CARE_PROVIDER_SITE_OTHER): Payer: Self-pay

## 2016-09-17 ENCOUNTER — Encounter: Payer: Self-pay | Admitting: Vascular Surgery

## 2016-09-17 NOTE — Telephone Encounter (Signed)
Cheryl from [facility] called stating the patient is running a 100.0 fever and is complaining of pain in right leg from her knee down to her foot. Spoke with Hezzie Bump and Malachy Mood know that the patient can take Ibuprofen if not on a blood thinner and if she is then she can take Tylenol to help with the pain. I also let her know that if her fever spikes to 101 and above she would need to be seen in the ED for evaluation.

## 2016-09-20 NOTE — H&P (Signed)
[facility] History & Physical Update  The patient was interviewed and re-examined.  The patient's previous History and Physical has been reviewed and is unchanged.  There is no change in the plan of care. We plan to proceed with the scheduled procedure.  Hortencia Pilar, MD  09/20/2016, 2:25 PM

## 2016-09-25 ENCOUNTER — Ambulatory Visit (INDEPENDENT_AMBULATORY_CARE_PROVIDER_SITE_OTHER): Payer: Medicare Other | Admitting: Vascular Surgery

## 2016-09-25 ENCOUNTER — Other Ambulatory Visit (INDEPENDENT_AMBULATORY_CARE_PROVIDER_SITE_OTHER): Payer: Medicare Other

## 2016-09-25 ENCOUNTER — Other Ambulatory Visit (INDEPENDENT_AMBULATORY_CARE_PROVIDER_SITE_OTHER): Payer: Self-pay | Admitting: Vascular Surgery

## 2016-09-25 ENCOUNTER — Encounter (INDEPENDENT_AMBULATORY_CARE_PROVIDER_SITE_OTHER): Payer: Self-pay | Admitting: Vascular Surgery

## 2016-09-25 ENCOUNTER — Encounter (INDEPENDENT_AMBULATORY_CARE_PROVIDER_SITE_OTHER): Payer: Self-pay

## 2016-09-25 VITALS — BP 96/57 | HR 77 | Resp 16

## 2016-09-25 DIAGNOSIS — I7090 Unspecified atherosclerosis: Secondary | ICD-10-CM

## 2016-09-25 DIAGNOSIS — I10 Essential (primary) hypertension: Secondary | ICD-10-CM

## 2016-09-25 DIAGNOSIS — I63442 Cerebral infarction due to embolism of left cerebellar artery: Secondary | ICD-10-CM

## 2016-09-25 DIAGNOSIS — I70263 Atherosclerosis of native arteries of extremities with gangrene, bilateral legs: Secondary | ICD-10-CM | POA: Diagnosis not present

## 2016-09-25 DIAGNOSIS — Z48812 Encounter for surgical aftercare following surgery on the circulatory system: Secondary | ICD-10-CM

## 2016-09-25 NOTE — Progress Notes (Signed)
MRN : [id number]  [name] is a 83 y.o. ([birth year]) female who presents with chief complaint of  Chief Complaint  Patient presents with  . Follow-up  .  History of Present Illness: The patient returns to the office for followup and review status post angiogram with intervention. The patient notes no improvement in the lower extremity symptoms. She is still having  rest pain symptoms. Previous wounds have not healed.  No new ulcers or wounds have occurred since the last visit.  There have been no significant changes to the patient's overall health care.  The patient denies amaurosis fugax or recent TIA symptoms. There are no recent neurological changes noted. The patient denies history of DVT, PE or superficial thrombophlebitis. The patient denies recent episodes of angina or shortness of breath.   ABI's Rt=0.63 and Lt=1.10  (previous ABI's Rt=0.78 and Lt=0.87)   Current Meds  Medication Sig  . acetaminophen (TYLENOL) 325 MG tablet Take 650 mg by mouth every 4 (four) hours as needed for mild pain (Dont exceed 300 mg in 24 hours from all sources).  Marland Kitchen allopurinol (ZYLOPRIM) 100 MG tablet Take 100 mg by mouth daily.  Marland Kitchen amitriptyline (ELAVIL) 25 MG tablet Take 25 mg by mouth at bedtime.  Marland Kitchen aspirin EC 81 MG EC tablet Take 1 tablet (81 mg total) by mouth daily.  Marland Kitchen atorvastatin (LIPITOR) 40 MG tablet Take 1 tablet (40 mg total) by mouth daily at 6 PM.  . Cholecalciferol (VITAMIN D-3) 1000 UNITS CAPS Take 2,000 Units by mouth daily.   . clopidogrel (PLAVIX) 75 MG tablet Take 75 mg by mouth daily with breakfast.  . DULoxetine (CYMBALTA) 60 MG capsule Take 60 mg by mouth daily.  . furosemide (LASIX) 20 MG tablet Take 1 tablet (20 mg total) by mouth every other day.  . gabapentin (NEURONTIN) 100 MG capsule Take 100-300 mg by mouth 3 (three) times daily. Take 1 cap (100mg ) in morning, 3 caps (300mg ) at 1400 and 2100   . HYDROcodone-acetaminophen (NORCO/VICODIN) 5-325 MG tablet Take 1  tablet by mouth every 6 (six) hours as needed for moderate pain.  Marland Kitchen latanoprost (XALATAN) 0.005 % ophthalmic solution Place 1 drop into both eyes at bedtime.  Marland Kitchen levothyroxine (SYNTHROID, LEVOTHROID) 25 MCG tablet Take 25 mcg by mouth daily before breakfast.  . Magnesium Sulfate, Laxative, (EPSOM SALT) GRAN by Does not apply route at bedtime. Dilute as directed on bag in warm water and soak both feet at bedtime  . metoprolol succinate (TOPROL-XL) 25 MG 24 hr tablet Take 25 mg by mouth daily.  . montelukast (SINGULAIR) 10 MG tablet Take 10 mg by mouth daily.  . pantoprazole (PROTONIX) 40 MG tablet Take 40 mg by mouth daily.  . polyvinyl alcohol (LIQUIFILM TEARS) 1.4 % ophthalmic solution Place 1 drop into both eyes 4 (four) times daily.  . Probiotic Product (RISA-BID PROBIOTIC) TABS Take 1 tablet by mouth daily.  Marland Kitchen senna (SENOKOT) 8.6 MG tablet Take 1 tablet by mouth 3 (three) times daily as needed for constipation.  . topiramate (TOPAMAX) 25 MG tablet Take 25 mg by mouth at bedtime.  . traMADol (ULTRAM) 50 MG tablet Take 50 mg by mouth every 8 (eight) hours as needed (for pain control).   . ursodiol (ACTIGALL) 300 MG capsule Take 300 mg by mouth 2 (two) times daily.  . vitamin B-12 (CYANOCOBALAMIN) 1000 MCG tablet Take 1,000 mcg by mouth daily.    Past Medical History:  Diagnosis Date  . Anxiety   .  Arthritis   . Asthma   . Calculus of gallbladder without mention of cholecystitis, with obstruction   . Candidiasis of the esophagus   . Chronic kidney disease, stage III (moderate)   . Chronic pain syndrome   . Corns and callosities   . Depression   . Dyspnea    with exertion  . Emphysema lung ([place])   . Esophageal reflux   . Essential hypertension, benign   . Glaucoma (increased eye pressure)   . Gout, unspecified   . Hemiplegia ([place])   . Hyperglycemia   . Hypertension   . Hypoglycemia, unspecified   . Hypopotassemia   . Migraine, unspecified, without mention of intractable migraine  without mention of status migrainosus   . Other vitamin B12 deficiency anemia   . Peripheral vascular disease ([place])   . Personal history of fall   . Stroke ([place])   . Thyroid disease   . Unspecified asthma(493.90)   . Unspecified constipation   . Unspecified hypertensive heart disease with heart failure(402.91)   . Unspecified vitamin D deficiency     Past Surgical History:  Procedure Laterality Date  . ABDOMINAL HYSTERECTOMY    . arterial thrombi stents in both legs    . BACK SURGERY    . COLONOSCOPY    . ESOPHAGOGASTRODUODENOSCOPY (EGD) WITH PROPOFOL N/A 06/19/2016   Procedure: ESOPHAGOGASTRODUODENOSCOPY (EGD) WITH PROPOFOL;  Surgeon: Lollie Sails, MD;  Location: [facility] ENDOSCOPY;  Service: Endoscopy;  Laterality: N/A;  . EYE SURGERY     cataract extraction  . PERIPHERAL VASCULAR CATHETERIZATION Left 09/02/2016   Procedure: Lower Extremity Angiography;  Surgeon: Katha Cabal, MD;  Location: [facility];  Service: Cardiovascular;  Laterality: Left;  . PERIPHERAL VASCULAR CATHETERIZATION Right 09/16/2016   Procedure: Lower Extremity Angiography;  Surgeon: Katha Cabal, MD;  Location: [facility];  Service: Cardiovascular;  Laterality: Right;    Social History Social History  Substance Use Topics  . Smoking status: Never Smoker  . Smokeless tobacco: Never Used  . Alcohol use No    Family History Family History  Problem Relation Age of Onset  . Cancer Father   . Stroke Father   No family history of bleeding/clotting disorders, porphyria or autoimmune disease   No Known Allergies   REVIEW OF SYSTEMS (Negative unless checked)  Constitutional: [] Weight loss  [] Fever  [] Chills Cardiac: [] Chest pain   [] Chest pressure   [] Palpitations   [] Shortness of breath when laying flat   [] Shortness of breath with exertion. Vascular:  [x] Pain in legs with walking   [] Pain in legs at rest  [] History of DVT   [] Phlebitis   [x] Swelling in legs   [] Varicose  veins   [x] Non-healing ulcers Pulmonary:   [] Uses home oxygen   [] Productive cough   [] Hemoptysis   [] Wheeze  [] COPD   [] Asthma Neurologic:  [] Dizziness   [] Seizures   [] History of stroke   [] History of TIA  [] Aphasia   [] Vissual changes   [] Weakness or numbness in arm   [] Weakness or numbness in leg Musculoskeletal:   [] Joint swelling   [] Joint pain   [] Low back pain Hematologic:  [] Easy bruising  [] Easy bleeding   [] Hypercoagulable state   [] Anemic Gastrointestinal:  [] Diarrhea   [] Vomiting  [] Gastroesophageal reflux/heartburn   [] Difficulty swallowing. Genitourinary:  [x] Chronic kidney disease   [] Difficult urination  [] Frequent urination   [] Blood in urine Skin:  [] Rashes   [] Ulcers  Psychological:  [] History of anxiety   []  History of major  depression.  Physical Examination  Vitals:   09/25/16 1148  BP: (!) 96/57  Pulse: 77  Resp: 16   There is no height or weight on file to calculate BMI. Gen: WD/WN, NAD in a wheel chair Head: [place]/AT, No temporalis wasting.  Ear/Nose/Throat: Hearing grossly intact, nares w/o erythema or drainage, poor dentition Eyes: PER, EOMI, sclera nonicteric.  Neck: Supple, no masses.  No bruit or JVD.  Pulmonary:  Good air movement, clear to auscultation bilaterally, no use of accessory muscles.  Cardiac: RRR, normal S1, S2, no Murmurs. Vascular:   There is dry gangrene of the left 2nd and 3rd toes on the dorsal surface and the right 2nd toe again dry; no odor no pus.  Both feet are cool to the touch with sluggish capillary refill. Vessel Right Left  Radial Palpable Palpable  Ulnar Palpable Palpable  Brachial Palpable Palpable  Carotid Palpable Palpable  Femoral Palpable Palpable  Popliteal Not Palpable Not Palpable  PT Not Palpable Not Palpable  DP Not Palpable Not Palpable  Gastrointestinal: soft, non-distended. No guarding/no peritoneal signs.  Musculoskeletal: M/S 5/5 throughout.  No deformity or atrophy.  Neurologic: CN 2-12 intact. Pain and light  touch intact in extremities.  Symmetrical.  Speech is fluent. Motor exam as listed above. Psychiatric: Judgment intact, Mood & affect appropriate for pt's clinical situation. Dermatologic: No rashes or ulcers noted.  No changes consistent with cellulitis. Lymph : No Cervical lymphadenopathy, no lichenification or skin changes of chronic lymphedema.  CBC Lab Results  Component Value Date   WBC 9.2 07/20/2016   HGB 12.0 07/20/2016   HCT 36.2 07/20/2016   MCV 94.3 07/20/2016   PLT 228 07/20/2016    BMET    Component Value Date/Time   NA 138 07/20/2016 1345   NA 139 09/22/2014 0521   K 4.3 07/20/2016 1345   K 4.4 09/22/2014 0521   CL 104 07/20/2016 1345   CL 109 (H) 09/22/2014 0521   CO2 27 07/20/2016 1345   CO2 24 09/22/2014 0521   GLUCOSE 90 07/20/2016 1345   GLUCOSE 83 09/22/2014 0521   BUN 11 09/15/2016 1123   BUN 9 09/22/2014 0521   CREATININE 1.23 (H) 09/15/2016 1123   CREATININE 0.97 09/22/2014 0521   CALCIUM 9.0 07/20/2016 1345   CALCIUM 9.0 09/22/2014 0521   GFRNONAA 39 (L) 09/15/2016 1123   GFRNONAA 58 (L) 09/22/2014 0521   GFRNONAA 43 (L) 02/27/2013 1139   GFRAA 45 (L) 09/15/2016 1123   GFRAA >60 09/22/2014 0521   GFRAA 50 (L) 02/27/2013 1139   Estimated Creatinine Clearance: 34.7 mL/min (by C-G formula based on SCr of 1.23 mg/dL (H)).  COAG Lab Results  Component Value Date   INR 1.12 07/20/2016   INR 1.04 06/19/2016    Radiology No results found.  Assessment/Plan 1. Atherosclerosis of native artery of both lower extremities with gangrene ([place])  Recommend:  The patient has evidence of severe atherosclerotic changes of the right  lower extremity associated with ulceration and tissue loss of the foot.  This represents a limb threatening ischemia and places the patient at the risk for limb loss.  Patient should undergo angiography of the right lower extremity with the hope for intervention for limb salvage.  The risks and benefits as well as the  alternative therapies was discussed in detail with the patient.  All questions were answered.  Patient agrees to proceed with angiography.  The patient will follow up with me in the office after the procedure.  2. Cerebrovascular accident (CVA) due to embolism of left cerebellar artery (HCC) Continue antiplatelet therapy  3. Essential hypertension Continue antihypertensive medications as already ordered, these medications have been reviewed and there are no changes at this time.   Hortencia Pilar, MD  09/25/2016 12:50 PM

## 2016-09-29 ENCOUNTER — Other Ambulatory Visit (INDEPENDENT_AMBULATORY_CARE_PROVIDER_SITE_OTHER): Payer: Self-pay

## 2016-10-06 ENCOUNTER — Encounter
Admission: RE | Admit: 2016-10-06 | Discharge: 2016-10-06 | Disposition: A | Discharge status: Home / Self Care | Payer: Medicare Other | Source: Ambulatory Visit | Attending: Vascular Surgery | Admitting: Vascular Surgery

## 2016-10-06 DIAGNOSIS — I63442 Cerebral infarction due to embolism of left cerebellar artery: Secondary | ICD-10-CM | POA: Diagnosis not present

## 2016-10-06 DIAGNOSIS — E559 Vitamin D deficiency, unspecified: Secondary | ICD-10-CM | POA: Diagnosis not present

## 2016-10-06 DIAGNOSIS — E7800 Pure hypercholesterolemia, unspecified: Secondary | ICD-10-CM | POA: Diagnosis not present

## 2016-10-06 DIAGNOSIS — E162 Hypoglycemia, unspecified: Secondary | ICD-10-CM | POA: Diagnosis not present

## 2016-10-06 DIAGNOSIS — Z823 Family history of stroke: Secondary | ICD-10-CM | POA: Diagnosis not present

## 2016-10-06 DIAGNOSIS — E876 Hypokalemia: Secondary | ICD-10-CM | POA: Diagnosis not present

## 2016-10-06 DIAGNOSIS — I130 Hypertensive heart and chronic kidney disease with heart failure and stage 1 through stage 4 chronic kidney disease, or unspecified chronic kidney disease: Secondary | ICD-10-CM | POA: Diagnosis not present

## 2016-10-06 DIAGNOSIS — Z90710 Acquired absence of both cervix and uterus: Secondary | ICD-10-CM | POA: Diagnosis not present

## 2016-10-06 DIAGNOSIS — H409 Unspecified glaucoma: Secondary | ICD-10-CM | POA: Diagnosis not present

## 2016-10-06 DIAGNOSIS — N183 Chronic kidney disease, stage 3 (moderate): Secondary | ICD-10-CM | POA: Diagnosis not present

## 2016-10-06 DIAGNOSIS — E079 Disorder of thyroid, unspecified: Secondary | ICD-10-CM | POA: Diagnosis not present

## 2016-10-06 DIAGNOSIS — I509 Heart failure, unspecified: Secondary | ICD-10-CM | POA: Diagnosis not present

## 2016-10-06 DIAGNOSIS — I70263 Atherosclerosis of native arteries of extremities with gangrene, bilateral legs: Secondary | ICD-10-CM | POA: Diagnosis present

## 2016-10-06 DIAGNOSIS — K219 Gastro-esophageal reflux disease without esophagitis: Secondary | ICD-10-CM | POA: Diagnosis not present

## 2016-10-06 DIAGNOSIS — J439 Emphysema, unspecified: Secondary | ICD-10-CM | POA: Diagnosis not present

## 2016-10-06 DIAGNOSIS — D519 Vitamin B12 deficiency anemia, unspecified: Secondary | ICD-10-CM | POA: Diagnosis not present

## 2016-10-06 DIAGNOSIS — L97919 Non-pressure chronic ulcer of unspecified part of right lower leg with unspecified severity: Secondary | ICD-10-CM | POA: Diagnosis not present

## 2016-10-06 DIAGNOSIS — G43909 Migraine, unspecified, not intractable, without status migrainosus: Secondary | ICD-10-CM | POA: Diagnosis not present

## 2016-10-06 DIAGNOSIS — Z809 Family history of malignant neoplasm, unspecified: Secondary | ICD-10-CM | POA: Diagnosis not present

## 2016-10-06 DIAGNOSIS — Z9181 History of falling: Secondary | ICD-10-CM | POA: Diagnosis not present

## 2016-10-06 DIAGNOSIS — M1990 Unspecified osteoarthritis, unspecified site: Secondary | ICD-10-CM | POA: Diagnosis not present

## 2016-10-06 HISTORY — DX: Hypothyroidism, unspecified: E03.9

## 2016-10-06 HISTORY — DX: Failed or difficult intubation, initial encounter: T88.4XXA

## 2016-10-06 LAB — ELECTROLYTE PANEL
ANION GAP: 10 (ref 5–15)
CHLORIDE: 103 mmol/L (ref 101–111)
CO2: 26 mmol/L (ref 22–32)
Potassium: 4.7 mmol/L (ref 3.5–5.1)
SODIUM: 139 mmol/L (ref 135–145)

## 2016-10-06 MED ORDER — CEFAZOLIN IN D5W 1 GM/50ML IV SOLN: 1.0000 g | Freq: Once | INTRAVENOUS | Status: DC

## 2016-10-07 ENCOUNTER — Ambulatory Visit
Admission: RE | Admit: 2016-10-07 | Discharge: 2016-10-07 | Disposition: A | Discharge status: Home / Self Care | Payer: Medicare Other | Source: Ambulatory Visit | Attending: Vascular Surgery | Admitting: Vascular Surgery

## 2016-10-07 ENCOUNTER — Encounter: Payer: Self-pay | Admitting: *Deleted

## 2016-10-07 ENCOUNTER — Encounter
Admission: RE | Disposition: A | Discharge status: Home / Self Care | Payer: Self-pay | Source: Ambulatory Visit | Attending: Vascular Surgery

## 2016-10-07 DIAGNOSIS — I130 Hypertensive heart and chronic kidney disease with heart failure and stage 1 through stage 4 chronic kidney disease, or unspecified chronic kidney disease: Secondary | ICD-10-CM | POA: Insufficient documentation

## 2016-10-07 DIAGNOSIS — E162 Hypoglycemia, unspecified: Secondary | ICD-10-CM | POA: Insufficient documentation

## 2016-10-07 DIAGNOSIS — K219 Gastro-esophageal reflux disease without esophagitis: Secondary | ICD-10-CM | POA: Insufficient documentation

## 2016-10-07 DIAGNOSIS — N183 Chronic kidney disease, stage 3 (moderate): Secondary | ICD-10-CM | POA: Insufficient documentation

## 2016-10-07 DIAGNOSIS — G43909 Migraine, unspecified, not intractable, without status migrainosus: Secondary | ICD-10-CM | POA: Insufficient documentation

## 2016-10-07 DIAGNOSIS — E876 Hypokalemia: Secondary | ICD-10-CM | POA: Insufficient documentation

## 2016-10-07 DIAGNOSIS — I70263 Atherosclerosis of native arteries of extremities with gangrene, bilateral legs: Secondary | ICD-10-CM | POA: Diagnosis not present

## 2016-10-07 DIAGNOSIS — M1990 Unspecified osteoarthritis, unspecified site: Secondary | ICD-10-CM | POA: Insufficient documentation

## 2016-10-07 DIAGNOSIS — I63442 Cerebral infarction due to embolism of left cerebellar artery: Secondary | ICD-10-CM | POA: Insufficient documentation

## 2016-10-07 DIAGNOSIS — I509 Heart failure, unspecified: Secondary | ICD-10-CM | POA: Insufficient documentation

## 2016-10-07 DIAGNOSIS — L97919 Non-pressure chronic ulcer of unspecified part of right lower leg with unspecified severity: Secondary | ICD-10-CM | POA: Insufficient documentation

## 2016-10-07 DIAGNOSIS — Z90710 Acquired absence of both cervix and uterus: Secondary | ICD-10-CM | POA: Insufficient documentation

## 2016-10-07 DIAGNOSIS — E079 Disorder of thyroid, unspecified: Secondary | ICD-10-CM | POA: Insufficient documentation

## 2016-10-07 DIAGNOSIS — Z823 Family history of stroke: Secondary | ICD-10-CM | POA: Insufficient documentation

## 2016-10-07 DIAGNOSIS — I70261 Atherosclerosis of native arteries of extremities with gangrene, right leg: Secondary | ICD-10-CM | POA: Diagnosis not present

## 2016-10-07 DIAGNOSIS — Z809 Family history of malignant neoplasm, unspecified: Secondary | ICD-10-CM | POA: Insufficient documentation

## 2016-10-07 DIAGNOSIS — Z9181 History of falling: Secondary | ICD-10-CM | POA: Insufficient documentation

## 2016-10-07 DIAGNOSIS — H409 Unspecified glaucoma: Secondary | ICD-10-CM | POA: Insufficient documentation

## 2016-10-07 DIAGNOSIS — E559 Vitamin D deficiency, unspecified: Secondary | ICD-10-CM | POA: Insufficient documentation

## 2016-10-07 DIAGNOSIS — J439 Emphysema, unspecified: Secondary | ICD-10-CM | POA: Insufficient documentation

## 2016-10-07 DIAGNOSIS — D519 Vitamin B12 deficiency anemia, unspecified: Secondary | ICD-10-CM | POA: Insufficient documentation

## 2016-10-07 DIAGNOSIS — E7800 Pure hypercholesterolemia, unspecified: Secondary | ICD-10-CM | POA: Insufficient documentation

## 2016-10-07 HISTORY — PX: LOWER EXTREMITY ANGIOGRAPHY: CATH118251

## 2016-10-07 SURGERY — LOWER EXTREMITY ANGIOGRAPHY
Anesthesia: Moderate Sedation | Site: Leg Lower | Laterality: Right

## 2016-10-07 MED ORDER — METOPROLOL TARTRATE 5 MG/5ML IV SOLN: 2.0000 mg | INTRAVENOUS | Status: DC | PRN

## 2016-10-07 MED ORDER — HYDROMORPHONE HCL 1 MG/ML IJ SOLN: 0.5000 mg | INTRAMUSCULAR | Status: DC | PRN

## 2016-10-07 MED ORDER — FENTANYL CITRATE (PF) 100 MCG/2ML IJ SOLN
INTRAMUSCULAR | Status: AC
  Filled 2016-10-07: qty 2

## 2016-10-07 MED ORDER — MIDAZOLAM HCL 5 MG/5ML IJ SOLN
INTRAMUSCULAR | Status: AC
  Filled 2016-10-07: qty 5

## 2016-10-07 MED ORDER — ALUM & MAG HYDROXIDE-SIMETH 200-200-20 MG/5ML PO SUSP: 15.0000 mL | ORAL | Status: DC | PRN

## 2016-10-07 MED ORDER — OXYCODONE HCL 5 MG PO TABS
5.0000 mg | ORAL_TABLET | ORAL | Status: DC | PRN
  Filled 2016-10-07: qty 2

## 2016-10-07 MED ORDER — NITROGLYCERIN 5 MG/ML IV SOLN
INTRAVENOUS | Status: AC
  Filled 2016-10-07: qty 10

## 2016-10-07 MED ORDER — SODIUM CHLORIDE 0.9 % IV SOLN
500.0000 mL | Freq: Once | INTRAVENOUS | Status: DC | PRN
Start: 2016-10-07 — End: 2016-10-07

## 2016-10-07 MED ORDER — CEFAZOLIN IN D5W 1 GM/50ML IV SOLN
1.0000 g | Freq: Once | INTRAVENOUS | Status: AC
  Administered 2016-10-07: 1 g via INTRAVENOUS

## 2016-10-07 MED ORDER — ACETAMINOPHEN 325 MG RE SUPP
325.0000 mg | RECTAL | Status: DC | PRN
  Filled 2016-10-07: qty 2

## 2016-10-07 MED ORDER — LIDOCAINE HCL (PF) 1 % IJ SOLN
INTRAMUSCULAR | Status: AC
  Filled 2016-10-07: qty 30

## 2016-10-07 MED ORDER — SODIUM CHLORIDE 0.9 % IV SOLN: 1.0000 mL/kg/h | INTRAVENOUS | Status: DC

## 2016-10-07 MED ORDER — ONDANSETRON HCL 4 MG/2ML IJ SOLN: 4.0000 mg | Freq: Four times a day (QID) | INTRAMUSCULAR | Status: DC | PRN

## 2016-10-07 MED ORDER — ACETAMINOPHEN 325 MG PO TABS: 325.0000 mg | ORAL_TABLET | ORAL | Status: DC | PRN

## 2016-10-07 MED ORDER — LABETALOL HCL 5 MG/ML IV SOLN: 10.0000 mg | INTRAVENOUS | Status: DC | PRN

## 2016-10-07 MED ORDER — MIDAZOLAM HCL 2 MG/2ML IJ SOLN
INTRAMUSCULAR | Status: DC | PRN
  Administered 2016-10-07 (×2): 0.5 mg via INTRAVENOUS
  Administered 2016-10-07: 2 mg via INTRAVENOUS

## 2016-10-07 MED ORDER — SODIUM CHLORIDE 0.9 % IJ SOLN
INTRAMUSCULAR | Status: AC
  Filled 2016-10-07: qty 50

## 2016-10-07 MED ORDER — SODIUM CHLORIDE 0.9 % IV SOLN: INTRAVENOUS | Status: DC

## 2016-10-07 MED ORDER — HEPARIN SODIUM (PORCINE) 1000 UNIT/ML IJ SOLN
INTRAMUSCULAR | Status: AC
  Filled 2016-10-07: qty 1

## 2016-10-07 MED ORDER — HYDRALAZINE HCL 20 MG/ML IJ SOLN: 5.0000 mg | INTRAMUSCULAR | Status: DC | PRN

## 2016-10-07 MED ORDER — GUAIFENESIN-DM 100-10 MG/5ML PO SYRP
15.0000 mL | ORAL_SOLUTION | ORAL | Status: DC | PRN
  Filled 2016-10-07: qty 15

## 2016-10-07 MED ORDER — HEPARIN SODIUM (PORCINE) 1000 UNIT/ML IJ SOLN
INTRAMUSCULAR | Status: DC | PRN
  Administered 2016-10-07: 5000 [IU] via INTRAVENOUS

## 2016-10-07 MED ORDER — FENTANYL CITRATE (PF) 100 MCG/2ML IJ SOLN
INTRAMUSCULAR | Status: DC | PRN
  Administered 2016-10-07 (×2): 25 ug via INTRAVENOUS
  Administered 2016-10-07: 100 ug via INTRAVENOUS

## 2016-10-07 MED ORDER — PHENOL 1.4 % MT LIQD
1.0000 | OROMUCOSAL | Status: DC | PRN
  Filled 2016-10-07: qty 177

## 2016-10-07 MED ORDER — NITROGLYCERIN 1 MG/10 ML FOR IR/CATH LAB
INTRA_ARTERIAL | Status: DC | PRN
  Administered 2016-10-07: 750 ug

## 2016-10-07 SURGICAL SUPPLY — 31 items
BALLN LUTONIX DCB 4X60X130 (BALLOONS) ×3
BALLN ULTRVRSE 3.5X100X150 (BALLOONS) ×3
BALLN ULTRVRSE 3X220X150 (BALLOONS) ×3
BALLOON LUTONIX DCB 4X60X130 (BALLOONS) ×1 IMPLANT
BALLOON ULTRVRSE 3.5X100X150 (BALLOONS) ×1 IMPLANT
BALLOON ULTRVRSE 3X220X150 (BALLOONS) ×1 IMPLANT
CATH CROSSER 14S OTW 146CM (CATHETERS) ×3 IMPLANT
CATH CXI SUPP ANG 2.6FR 150CM (MICROCATHETER) ×3 IMPLANT
CATH CXI SUPP ST 2.6FR 150CM (CATHETERS) ×3 IMPLANT
CATH GWIRE MARINER STRGHT 4FR (CATHETERS) ×3 IMPLANT
CATH PIG 70CM (CATHETERS) ×3 IMPLANT
CATH RIM 65CM (CATHETERS) ×3 IMPLANT
CATH SIDEKICK XL ST 110CM (SHEATH) ×3 IMPLANT
DEVICE PRESTO INFLATION (MISCELLANEOUS) ×3 IMPLANT
DEVICE STARCLOSE SE CLOSURE (Vascular Products) ×3 IMPLANT
DEVICE TORQUE (MISCELLANEOUS) ×6 IMPLANT
GLIDEWIRE ANGLED SS 035X260CM (WIRE) ×3 IMPLANT
GUIDEWIRE GOLD .018X300 (WIRE) ×3 IMPLANT
GUIDEWIRE PFTE-COATED .018X300 (WIRE) ×3 IMPLANT
KIT FLOWMATE PROCEDURAL (MISCELLANEOUS) ×3 IMPLANT
NEEDLE ENTRY 21GA 7CM ECHOTIP (NEEDLE) ×3 IMPLANT
PACK ANGIOGRAPHY (CUSTOM PROCEDURE TRAY) ×3 IMPLANT
SET INTRO CAPELLA COAXIAL (SET/KITS/TRAYS/PACK) ×6 IMPLANT
SHEATH BRITE TIP 5FRX11 (SHEATH) ×3 IMPLANT
SHEATH FLEXORE 7FRX80 (SHEATH) ×3 IMPLANT
SHIELD RADPAD DADD DRAPE 4X9 (MISCELLANEOUS) ×3 IMPLANT
SYR MEDRAD MARK V 150ML (SYRINGE) ×3 IMPLANT
TUBING CONTRAST HIGH PRESS 72 (TUBING) ×3 IMPLANT
WIRE G V18X300CM (WIRE) ×3 IMPLANT
WIRE J 3MM .035X145CM (WIRE) ×3 IMPLANT
WIRE SPARTACORE .014X300CM (WIRE) ×3 IMPLANT

## 2016-10-07 NOTE — H&P (Signed)
[facility] History & Physical Update  The patient was interviewed and re-examined.  The patient's previous History and Physical has been reviewed and is unchanged.  There is no change in the plan of care. We plan to proceed with the scheduled procedure.  Hortencia Pilar, MD  10/07/2016, 9:25 AM

## 2016-10-07 NOTE — Op Note (Signed)
[facility] Percutaneous Study/Intervention Procedural Note   Date of Surgery: 10/07/2016  Surgeon:  Katha Cabal, MD.  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 Introduction catheter into right lower extremity 3rd order catheter placement  2. Contrast injection right lower extremity for distal runoff   3. Crosser atherectomy of the right peroneal artery 4. Percutaneous transluminal angioplasty right peroneal artery with a 3.5 mm balloon             5.  Percutaneous transluminal angioplasty of the SFA and popliteal arteries with a 4 x 16 mm Lutonix balloon                      6.   Star close closure left common femoral arteriotomy                Anesthesia: Conscious sedation was administered under my direct supervision. IV Versed plus fentanyl were utilized. Continuous ECG, pulse oximetry and blood pressure was monitored throughout the entire procedure. Conscious sedation was for a total of 105 minutes.  Sheath: 7 [place] shuttle left common femoral artery  Contrast: 60 cc  Fluoroscopy Time: 16.1 minutes  Indications: [name] presents with atherosclerotic occlusive disease bilateral lower extremities. The patient has developed ulceration and gangrene of the soft tissues of the right lower extremity. This places the patient at high risk for limb loss and amputation. The risks and benefits are reviewed all questions answered patient agrees to proceed.  Procedure: [name] is a 83 y.o. y.o. female who was identified and appropriate procedural time out was performed. The patient was then placed supine on the table and prepped and draped in the usual sterile fashion.   Ultrasound was placed in the sterile sleeve and the left  groin was evaluated the left common femoral artery was echolucent and pulsatile indicating patency.  Image was recorded for the permanent record and under real-time visualization a microneedle was inserted into the common femoral artery microwire followed by a micro-sheath.  A J-wire was then advanced through the micro-sheath and a  5 [place] sheath was then inserted over a J-wire. A rim catheter and a stiff angle Glidewire was used to cross the aortic bifurcation the catheter wire were advanced down into the right distal external iliac artery. Oblique view of the femoral bifurcation was then obtained and subsequently the wire was reintroduced and the pigtail catheter negotiated into the SFA representing third order catheter placement. Distal runoff was then performed.  5000 units of heparin was then given and allowed to circulate and a 7 [place] shuttle sheath was advanced up and over the bifurcation and positioned in the common femoral artery.  Wire and catheter were then used to negotiate the SFA stenosis and the catheter was then positioned in the distal popliteal where magnified imaging of the trifurcation was performed.  The 70 S Crosser catheter was then prepped on the field and a straight sidekick catheter was advanced into the cul-de-sac of the peroneal under magnified imaging. Using the Crosser catheter the occlusion of the peroneal was negotiated.  Injection of contrast confirmed intraluminal positioning.  CXI catheter and stiff angle Glidewire were then advanced down into the distal tibial.  Distal runoff was then completed by hand injection through the catheter verifying intraluminal position and distal runoff.  A 3 x 20 Ulttraverse balloon and then a  3.5 x 10 Ultraverse balloon was then used to angioplasty the peroneal artery. Inflations were to 10-12 atm for 2 full minutes. Follow-up imaging demonstrated patency of the tibial artery and the SFA and popliteal were now addressed  A 4 x 60 mm  Lutonix balloon was used to angioplasty the superficial femoral and popliteal arteries. Inflations were to 12 atmospheres for 2 minutes.  Distal runoff was then reassessed.  After review of these images the sheath is pulled into the left external iliac oblique of the common femoral is obtained and a Star close device deployed. There no immediate Complications.  Findings:  The left common femoral is widely patent as is the profunda femoris.  The SFA does indeed have a significant stenosis in the proximal popliteal SFA junction at [name]'s canal.  The distal popliteal demonstrates increasing disease and the trifurcation is heavily diseased with occlusion of the anterior tibial peroneal and posterior tibial arteries.  The peroneal reconstitutes in its midportion is patent all the way down to the ankle where collateralizes to the dorsalis pedis. Dorsalis pedis is the dominant outflow to the foot. The anterior tibial and posterior tibial remain occluded throughout their entire course and there is nonvisualization of the lateral plantar branches.    Following angioplasty peroneal is now widely patent with in-line flow and looks quite nice with less than 5% residual stenosis. Angioplasty of the SFA and popliteal with a Lutonix balloon shows an excellent result with less than 10% residual stenosis.   Disposition: Patient was taken to the recovery room in stable condition having tolerated the procedure well.  [name] 10/07/2016,12:08 PM

## 2016-10-08 ENCOUNTER — Encounter: Payer: Self-pay | Admitting: Vascular Surgery

## 2016-10-24 ENCOUNTER — Other Ambulatory Visit (INDEPENDENT_AMBULATORY_CARE_PROVIDER_SITE_OTHER): Payer: Self-pay | Admitting: Vascular Surgery

## 2016-10-24 DIAGNOSIS — I70239 Atherosclerosis of native arteries of right leg with ulceration of unspecified site: Secondary | ICD-10-CM

## 2016-10-24 DIAGNOSIS — Z9862 Peripheral vascular angioplasty status: Secondary | ICD-10-CM

## 2016-10-24 DIAGNOSIS — I70249 Atherosclerosis of native arteries of left leg with ulceration of unspecified site: Principal | ICD-10-CM

## 2016-10-27 ENCOUNTER — Ambulatory Visit (INDEPENDENT_AMBULATORY_CARE_PROVIDER_SITE_OTHER): Payer: Medicare Other | Admitting: Vascular Surgery

## 2016-10-27 ENCOUNTER — Encounter (INDEPENDENT_AMBULATORY_CARE_PROVIDER_SITE_OTHER): Payer: Self-pay | Admitting: Vascular Surgery

## 2016-10-27 ENCOUNTER — Ambulatory Visit (INDEPENDENT_AMBULATORY_CARE_PROVIDER_SITE_OTHER): Payer: Medicare Other

## 2016-10-27 VITALS — BP 137/62 | HR 59 | Resp 16 | Ht 65.0 in | Wt 180.0 lb

## 2016-10-27 DIAGNOSIS — I63442 Cerebral infarction due to embolism of left cerebellar artery: Secondary | ICD-10-CM | POA: Diagnosis not present

## 2016-10-27 DIAGNOSIS — Z9862 Peripheral vascular angioplasty status: Secondary | ICD-10-CM | POA: Diagnosis not present

## 2016-10-27 DIAGNOSIS — I10 Essential (primary) hypertension: Secondary | ICD-10-CM

## 2016-10-27 DIAGNOSIS — I70263 Atherosclerosis of native arteries of extremities with gangrene, bilateral legs: Secondary | ICD-10-CM

## 2016-10-27 DIAGNOSIS — M109 Gout, unspecified: Secondary | ICD-10-CM | POA: Insufficient documentation

## 2016-10-27 DIAGNOSIS — I70239 Atherosclerosis of native arteries of right leg with ulceration of unspecified site: Secondary | ICD-10-CM

## 2016-10-27 DIAGNOSIS — M1A09X0 Idiopathic chronic gout, multiple sites, without tophus (tophi): Secondary | ICD-10-CM

## 2016-10-27 DIAGNOSIS — I70249 Atherosclerosis of native arteries of left leg with ulceration of unspecified site: Secondary | ICD-10-CM

## 2016-10-27 NOTE — Progress Notes (Signed)
MRN : [id number]  [name] is a 83 y.o. (1927-10-13) female who presents with chief complaint of  Chief Complaint  Patient presents with  . Routine Post Op    LE angio 3 week follow up  .  History of Present Illness: The patient returns to the office for followup and review status post angiogram with intervention. The patient notes improvement in the lower extremity symptoms. No interval shortening of the patient's claudication distance or rest pain symptoms. Previous wounds have now healed.  No new ulcers or wounds have occurred since the last visit.  There have been no significant changes to the patient's overall health care.  The patient denies amaurosis fugax or recent TIA symptoms. There are no recent neurological changes noted. The patient denies history of DVT, PE or superficial thrombophlebitis. The patient denies recent episodes of angina or shortness of breath.   ABI's Rt=1.06 and Lt=1.17  (previous ABI's Rt=0.63 and Lt=1.10)   Current Meds  Medication Sig  . acetaminophen (TYLENOL) 325 MG tablet Take 650 mg by mouth every 4 (four) hours as needed for mild pain (Dont exceed 300 mg in 24 hours from all sources).  Marland Kitchen allopurinol (ZYLOPRIM) 100 MG tablet Take 100 mg by mouth daily.  Marland Kitchen amitriptyline (ELAVIL) 25 MG tablet Take 25 mg by mouth at bedtime.  Marland Kitchen aspirin EC 81 MG EC tablet Take 1 tablet (81 mg total) by mouth daily.  Marland Kitchen atorvastatin (LIPITOR) 40 MG tablet Take 1 tablet (40 mg total) by mouth daily at 6 PM.  . Cholecalciferol (VITAMIN D-3) 1000 UNITS CAPS Take 2,000 Units by mouth daily.   . clopidogrel (PLAVIX) 75 MG tablet Take 75 mg by mouth daily with breakfast.  . DULoxetine (CYMBALTA) 30 MG capsule Take 30 mg by mouth daily.  . DULoxetine (CYMBALTA) 60 MG capsule Take 60 mg by mouth daily.  . furosemide (LASIX) 20 MG tablet Take 1 tablet (20 mg total) by mouth every other day.  . gabapentin (NEURONTIN) 100 MG capsule Take 100 mg by mouth 3 (three) times  daily. 100mg  in am, 300mg  bid at 2pm and 9pm  . HYDROcodone-acetaminophen (NORCO/VICODIN) 5-325 MG tablet Take 1 tablet by mouth every 6 (six) hours as needed for moderate pain.  Marland Kitchen latanoprost (XALATAN) 0.005 % ophthalmic solution Place 1 drop into both eyes at bedtime.  Marland Kitchen levothyroxine (SYNTHROID, LEVOTHROID) 25 MCG tablet Take 25 mcg by mouth daily before breakfast.  . metoprolol succinate (TOPROL-XL) 25 MG 24 hr tablet Take 25 mg by mouth daily.  . montelukast (SINGULAIR) 10 MG tablet Take 10 mg by mouth at bedtime.   . pantoprazole (PROTONIX) 40 MG tablet Take 40 mg by mouth daily at 6 (six) AM.   . Probiotic Product (ALIGN) 4 MG CAPS Take 4 mg by mouth daily.  Marland Kitchen senna (SENOKOT) 8.6 MG tablet Take 1 tablet by mouth 3 (three) times daily as needed for constipation.  . topiramate (TOPAMAX) 25 MG tablet Take 25 mg by mouth at bedtime.  . traMADol (ULTRAM) 50 MG tablet Take 50 mg by mouth every 8 (eight) hours.   . ursodiol (ACTIGALL) 300 MG capsule Take 300 mg by mouth 2 (two) times daily.  . vitamin B-12 (CYANOCOBALAMIN) 1000 MCG tablet Take 1,000 mcg by mouth daily.   Current Facility-Administered Medications for the 10/27/16 encounter (Office Visit) with Katha Cabal, MD  Medication  . ceFAZolin (ANCEF) IVPB 1 g/50 mL premix    Past Medical History:  Diagnosis Date  .  Anxiety   . Arthritis   . Asthma   . Calculus of gallbladder without mention of cholecystitis, with obstruction   . Candidiasis of the esophagus   . Chronic kidney disease, stage III (moderate)   . Chronic pain syndrome   . Corns and callosities   . Depression   . Difficult intubation   . Dyspnea    with exertion  . Emphysema lung ([place])   . Esophageal reflux   . Essential hypertension, benign   . Glaucoma (increased eye pressure)   . Gout, unspecified   . Hemiplegia ([place])   . Hyperglycemia   . Hypertension   . Hypoglycemia, unspecified   . Hypopotassemia   . Hypothyroidism   . Migraine, unspecified,  without mention of intractable migraine without mention of status migrainosus   . Other vitamin B12 deficiency anemia   . Peripheral vascular disease ([place])   . Personal history of fall   . Stroke ([place])   . Thyroid disease   . Unspecified asthma(493.90)   . Unspecified constipation   . Unspecified hypertensive heart disease with heart failure(402.91)   . Unspecified vitamin D deficiency     Past Surgical History:  Procedure Laterality Date  . ABDOMINAL HYSTERECTOMY    . arterial thrombi stents in both legs    . BACK SURGERY    . COLONOSCOPY    . ESOPHAGOGASTRODUODENOSCOPY (EGD) WITH PROPOFOL N/A 06/19/2016   Procedure: ESOPHAGOGASTRODUODENOSCOPY (EGD) WITH PROPOFOL;  Surgeon: Lollie Sails, MD;  Location[facility] ENDOSCOPY;  Service: Endoscopy;  Laterality: N/A;  . EYE SURGERY     cataract extraction  . LOWER EXTREMITY ANGIOGRAPHY Right 10/07/2016   Procedure: Lower Extremity Angiography;  Surgeon: Katha Cabal, MD;  Location: [facility];  Service: Cardiovascular;  Laterality: Right;  . PERIPHERAL VASCULAR CATHETERIZATION Left 09/02/2016   Procedure: Lower Extremity Angiography;  Surgeon: Katha Cabal, MD;  Location: [facility];  Service: Cardiovascular;  Laterality: Left;  . PERIPHERAL VASCULAR CATHETERIZATION Right 09/16/2016   Procedure: Lower Extremity Angiography;  Surgeon: Katha Cabal, MD;  Location: [facility];  Service: Cardiovascular;  Laterality: Right;    Social History Social History  Substance Use Topics  . Smoking status: Never Smoker  . Smokeless tobacco: Never Used  . Alcohol use No    Family History Family History  Problem Relation Age of Onset  . Cancer Father   . Stroke Father   No family history of bleeding/clotting disorders, porphyria or autoimmune disease   No Known Allergies   REVIEW OF SYSTEMS (Negative unless checked)  Constitutional: [] Weight loss  [] Fever  [] Chills Cardiac: [] Chest pain   [] Chest  pressure   [] Palpitations   [] Shortness of breath when laying flat   [] Shortness of breath with exertion. Vascular:  [] Pain in legs with walking   [] Pain in legs at rest  [] History of DVT   [] Phlebitis   [] Swelling in legs   [] Varicose veins   [] Non-healing ulcers Pulmonary:   [] Uses home oxygen   [] Productive cough   [] Hemoptysis   [] Wheeze  [] COPD   [] Asthma Neurologic:  [] Dizziness   [] Seizures   [] History of stroke   [] History of TIA  [] Aphasia   [] Vissual changes   [] Weakness or numbness in arm   [] Weakness or numbness in leg Musculoskeletal:   [] Joint swelling   [] Joint pain   [] Low back pain Hematologic:  [] Easy bruising  [] Easy bleeding   [] Hypercoagulable state   [] Anemic Gastrointestinal:  [] Diarrhea   []   Vomiting  [] Gastroesophageal reflux/heartburn   [] Difficulty swallowing. Genitourinary:  [] Chronic kidney disease   [] Difficult urination  [] Frequent urination   [] Blood in urine Skin:  [] Rashes   [] Ulcers  Psychological:  [] History of anxiety   []  History of major depression.  Physical Examination  Vitals:   10/27/16 0831  BP: 137/62  Pulse: (!) 59  Resp: 16  Weight: 180 lb (81.6 kg)  Height: 5\' 5"  (1.651 m)   Body mass index is 29.95 kg/m. Gen: WD/WN, NAD Head: [place]/AT, No temporalis wasting.  Ear/Nose/Throat: Hearing grossly intact, nares w/o erythema or drainage, poor dentition Eyes: PER, EOMI, sclera nonicteric.  Neck: Supple, no masses.  No bruit or JVD.  Pulmonary:  Good air movement, clear to auscultation bilaterally, no use of accessory muscles.  Cardiac: RRR, normal S1, S2, no Murmurs. Vascular:   There is dry gangrene of the left 2nd and 3rd toes on the dorsal surface and the right 2nd toe again dry; no odor no pus. Both feet are cool to the touch with sluggish capillary refill. Vessel Right Left  Radial Palpable Palpable  Ulnar Palpable Palpable  Brachial Palpable Palpable  Carotid Palpable Palpable  Femoral Palpable Palpable  Popliteal Not Palpable Not  Palpable  PT Not Palpable Not Palpable  DP Not Palpable Not Palpable  Gastrointestinal: soft, non-distended. No guarding/no peritoneal signs.  Musculoskeletal: M/S 5/5 throughout.  No deformity or atrophy.  Neurologic: CN 2-12 intact. Pain and light touch intact in extremities.  Symmetrical.  Speech is fluent. Motor exam as listed above. Psychiatric: Judgment intact, Mood & affect appropriate for pt's clinical situation. Dermatologic: No rashes or ulcers noted.  No changes consistent with cellulitis. Lymph : No Cervical lymphadenopathy, no lichenification or skin changes of chronic lymphedema.  CBC Lab Results  Component Value Date   WBC 9.2 07/20/2016   HGB 12.0 07/20/2016   HCT 36.2 07/20/2016   MCV 94.3 07/20/2016   PLT 228 07/20/2016    BMET    Component Value Date/Time   NA 139 10/06/2016 0958   NA 139 09/22/2014 0521   K 4.7 10/06/2016 0958   K 4.4 09/22/2014 0521   CL 103 10/06/2016 0958   CL 109 (H) 09/22/2014 0521   CO2 26 10/06/2016 0958   CO2 24 09/22/2014 0521   GLUCOSE 90 07/20/2016 1345   GLUCOSE 83 09/22/2014 0521   BUN 11 09/15/2016 1123   BUN 9 09/22/2014 0521   CREATININE 1.23 (H) 09/15/2016 1123   CREATININE 0.97 09/22/2014 0521   CALCIUM 9.0 07/20/2016 1345   CALCIUM 9.0 09/22/2014 0521   GFRNONAA 39 (L) 09/15/2016 1123   GFRNONAA 58 (L) 09/22/2014 0521   GFRNONAA 43 (L) 02/27/2013 1139   GFRAA 45 (L) 09/15/2016 1123   GFRAA >60 09/22/2014 0521   GFRAA 50 (L) 02/27/2013 1139   CrCl cannot be calculated (Patient's most recent lab result is older than the maximum 21 days allowed.).  COAG Lab Results  Component Value Date   INR 1.12 07/20/2016   INR 1.04 06/19/2016    Radiology No results found.  Assessment/Plan 1. Atherosclerosis of native artery of both lower extremities with gangrene ([place]) Recommend:  The patient is status post successful angiogram with intervention.  The patient reports that the claudication symptoms and leg pain is  essentially gone.   The patient denies lifestyle limiting changes at this point in time.  No further invasive studies, angiography or surgery at this time The patient should continue walking and begin a more formal  exercise program.  The patient should continue antiplatelet therapy and aggressive treatment of the lipid abnormalities  The patient should continue wearing graduated compression socks 10-15 mmHg strength to control the mild edema.  Patient should undergo noninvasive studies as ordered. The patient will follow up with me after the studies.    2. Cerebrovascular accident (CVA) due to embolism of left cerebellar artery (HCC) Continue antiplatelet therapy  3. Essential hypertension Continue antihypertensive medications as already ordered, these medications have been reviewed and there are no changes at this time.  4. Idiopathic chronic gout of multiple sites without tophus Continue antigout medications as already ordered, these medications have been reviewed and there are no changes at this time.   Hortencia Pilar, MD  10/27/2016 8:52 AM

## 2016-11-24 ENCOUNTER — Ambulatory Visit: Payer: Medicare Other | Admitting: Podiatry

## 2016-11-27 ENCOUNTER — Ambulatory Visit (INDEPENDENT_AMBULATORY_CARE_PROVIDER_SITE_OTHER): Payer: Medicare Other | Admitting: Podiatry

## 2016-11-27 ENCOUNTER — Encounter: Payer: Self-pay | Admitting: Podiatry

## 2016-11-27 VITALS — BP 142/66 | HR 66

## 2016-11-27 DIAGNOSIS — L97501 Non-pressure chronic ulcer of other part of unspecified foot limited to breakdown of skin: Secondary | ICD-10-CM

## 2016-11-27 NOTE — Progress Notes (Signed)
   Subjective:    Patient ID: [name], female    DOB: Jan 21, 1928, 83 y.o.   MRN: [id number]  HPI this patient presents the office with chief complaint of painful corns on toes on both feet. She says these corns on her toes become painful walking and wearing her shoes. The skin has broken down at the sites of her corns and has minimal drainage. She says there is no evidence of any drainage the last few days.  She gives a history of having vascular problems and has had angiograms performed on both of her legs. She says that she was told they were 100% open according to her vascular doctor following last surgery.  She presents the office today for an evaluation of these painful skin lesions on the toes both feet.    Review of Systems  HENT: Positive for trouble swallowing.   Respiratory: Positive for shortness of breath.   Cardiovascular: Positive for leg swelling.  Musculoskeletal: Positive for gait problem.  Skin:       Open sores  Neurological: Positive for weakness.  Hematological:       Slow to heal       Objective:   Physical Exam GENERAL APPEARANCE: Alert, conversant. Appropriately groomed. No acute distress.  VASCULAR: Pedal pulses are weakly  palpable at  [facility] and PT bilateral.  Capillary refill time is diminished.  Cold feet.   NEUROLOGIC: sensation is normal to 5.07 monofilament at 5/5 sites bilateral.  Light touch is intact bilateral, Muscle strength normal.  MUSCULOSKELETAL: acceptable muscle strength, tone and stability bilateral.  Intrinsic muscluature intact bilateral.  Rectus appearance of foot and digits noted bilateral. Hammer toes 2,3 left and hammer toes 2,3 right with mallet toe third toe right.  DERMATOLOGIC: skin color, texture, and turgor are within normal limits.  No preulcerative lesions or ulcers  are seen, no interdigital maceration noted.  No open lesions present.  Digital nails are asymptomatic. No drainage noted.  Healing skin lesions noted over PIPJ second  and third toes left foot. Healing lesion over DIPJ third toe right foot.        Assessment & Plan:  Ulcer secondary to pressure secondary to PVD.  IE  Dispense padding to the painful areas on both feet.  Told this patient that she would benefit from not wearing footgear on her toes. She would benefit from sandals also dispensed padding to help cushion the healing skin lesions on the toes on both feet. Told this patient that she should mention these problems to her vascular doctor. Return to the clinic when necessary   Gardiner Barefoot DPM

## 2017-01-08 ENCOUNTER — Encounter (INDEPENDENT_AMBULATORY_CARE_PROVIDER_SITE_OTHER): Payer: Medicare Other

## 2017-01-08 ENCOUNTER — Ambulatory Visit (INDEPENDENT_AMBULATORY_CARE_PROVIDER_SITE_OTHER): Payer: Medicare Other | Admitting: Vascular Surgery

## 2017-01-24 NOTE — Progress Notes (Signed)
MRN : [id number]  [name] is a 83 y.o. (20-Dec-1927) female who presents with chief complaint of No chief complaint on file. Marland Kitchen  History of Present Illness:  The patient returns to the office for followup and review status post angiogram with intervention. Angiogram right lower extremity on 10/07/2016 with PTA of the SFA and crosser atherectomy of the peroneal with PTA.  The patient notes improvement in the lower extremity symptoms. No interval shortening of the patient's claudication distance or rest pain symptoms. Previous wounds have now healed.  No new ulcers or wounds have occurred since the last visit.  There have been no significant changes to the patient's overall health care.  The patient denies amaurosis fugax or recent TIA symptoms. There are no recent neurological changes noted. The patient denies history of DVT, PE or superficial thrombophlebitis. The patient denies recent episodes of angina or shortness of breath.   ABI's Rt=0.69 and Lt=0.94  (previous ABI's Rt=1.06 and Lt=1.17)   No outpatient prescriptions have been marked as taking for the 01/26/17 encounter (Appointment) with Delana Meyer, Dolores Lory, MD.   Current Facility-Administered Medications for the 01/26/17 encounter (Appointment) with Delana Meyer, Dolores Lory, MD  Medication  . ceFAZolin (ANCEF) IVPB 1 g/50 mL premix    Past Medical History:  Diagnosis Date  . Anxiety   . Arthritis   . Asthma   . Calculus of gallbladder without mention of cholecystitis, with obstruction   . Candidiasis of the esophagus   . Chronic kidney disease, stage III (moderate)   . Chronic pain syndrome   . Corns and callosities   . Depression   . Difficult intubation   . Dyspnea    with exertion  . Emphysema lung ([place])   . Esophageal reflux   . Essential hypertension, benign   . Glaucoma (increased eye pressure)   . Gout, unspecified   . Hemiplegia ([place])   . Hyperglycemia   . Hypertension   . Hypoglycemia, unspecified   .  Hypopotassemia   . Hypothyroidism   . Migraine, unspecified, without mention of intractable migraine without mention of status migrainosus   . Other vitamin B12 deficiency anemia   . Peripheral vascular disease ([place])   . Personal history of fall   . Stroke ([place])   . Thyroid disease   . Unspecified asthma(493.90)   . Unspecified constipation   . Unspecified hypertensive heart disease with heart failure(402.91)   . Unspecified vitamin D deficiency     Past Surgical History:  Procedure Laterality Date  . ABDOMINAL HYSTERECTOMY    . arterial thrombi stents in both legs    . BACK SURGERY    . COLONOSCOPY    . ESOPHAGOGASTRODUODENOSCOPY (EGD) WITH PROPOFOL N/A 06/19/2016   Procedure: ESOPHAGOGASTRODUODENOSCOPY (EGD) WITH PROPOFOL;  Surgeon: Lollie Sails, MD;  Location: [facility] ENDOSCOPY;  Service: Endoscopy;  Laterality: N/A;  . EYE SURGERY     cataract extraction  . LOWER EXTREMITY ANGIOGRAPHY Right 10/07/2016   Procedure: Lower Extremity Angiography;  Surgeon: Katha Cabal, MD;  Location: [place] CV LAB;  Service: Cardiovascular;  Laterality: Right;  . PERIPHERAL VASCULAR CATHETERIZATION Left 09/02/2016   Procedure: Lower Extremity Angiography;  Surgeon: Katha Cabal, MD;  Location: [place] [facility];  Service: Cardiovascular;  Laterality: Left;  . PERIPHERAL VASCULAR CATHETERIZATION Right 09/16/2016   Procedure: Lower Extremity Angiography;  Surgeon: Katha Cabal, MD;  Location: [place] [facility];  Service: Cardiovascular;  Laterality: Right;    Social History Social History  Substance Use Topics  . Smoking status: Never Smoker  . Smokeless tobacco: Never Used  . Alcohol use No    Family History Family History  Problem Relation Age of Onset  . Cancer Father   . Stroke Father     No Known Allergies   REVIEW OF SYSTEMS (Negative unless checked)  Constitutional: [] Weight loss  [] Fever  [] Chills Cardiac: [] Chest pain   [] Chest pressure    [] Palpitations   [] Shortness of breath when laying flat   [] Shortness of breath with exertion. Vascular:  [x] Pain in legs with walking   [] Pain in legs at rest  [] History of DVT   [] Phlebitis   [x] Swelling in legs   [] Varicose veins   [] Non-healing ulcers Pulmonary:   [] Uses home oxygen   [] Productive cough   [] Hemoptysis   [] Wheeze  [] COPD   [] Asthma Neurologic:  [] Dizziness   [] Seizures   [] History of stroke   [] History of TIA  [] Aphasia   [] Vissual changes   [] Weakness or numbness in arm   [] Weakness or numbness in leg Musculoskeletal:   [] Joint swelling   [] Joint pain   [] Low back pain Hematologic:  [] Easy bruising  [] Easy bleeding   [] Hypercoagulable state   [] Anemic Gastrointestinal:  [] Diarrhea   [] Vomiting  [] Gastroesophageal reflux/heartburn   [] Difficulty swallowing. Genitourinary:  [x] Chronic kidney disease   [] Difficult urination  [] Frequent urination   [] Blood in urine Skin:  [] Rashes   [] Ulcers  Psychological:  [] History of anxiety   []  History of major depression.  Physical Examination  There were no vitals filed for this visit. There is no height or weight on file to calculate BMI. Gen: WD/WN, NAD Head: [place]/AT, No temporalis wasting.  Ear/Nose/Throat: Hearing grossly intact, nares w/o erythema or drainage Eyes: PER, EOMI, sclera nonicteric.  Neck: Supple, no large masses.   Pulmonary:  Good air movement, no audible wheezing bilaterally, no use of accessory muscles.  Cardiac: RRR, no JVD Vascular: All foot wounds have healed  Vessel Right Left  Radial Palpable Palpable  PT Not Palpable Not Palpable  DP Not Palpable Not Palpable  Gastrointestinal: Non-distended. No guarding/no peritoneal signs.  Musculoskeletal: M/S 5/5 throughout.  No deformity or atrophy.  Neurologic: CN 2-12 intact. Symmetrical.  Speech is fluent. Motor exam as listed above. Psychiatric: Judgment intact, Mood & affect appropriate for pt's clinical situation. Dermatologic: No rashes or ulcers noted.  No  changes consistent with cellulitis. Lymph : No lichenification or skin changes of chronic lymphedema.  CBC Lab Results  Component Value Date   WBC 9.2 07/20/2016   HGB 12.0 07/20/2016   HCT 36.2 07/20/2016   MCV 94.3 07/20/2016   PLT 228 07/20/2016    BMET    Component Value Date/Time   NA 139 10/06/2016 0958   NA 139 09/22/2014 0521   K 4.7 10/06/2016 0958   K 4.4 09/22/2014 0521   CL 103 10/06/2016 0958   CL 109 (H) 09/22/2014 0521   CO2 26 10/06/2016 0958   CO2 24 09/22/2014 0521   GLUCOSE 90 07/20/2016 1345   GLUCOSE 83 09/22/2014 0521   BUN 11 09/15/2016 1123   BUN 9 09/22/2014 0521   CREATININE 1.23 (H) 09/15/2016 1123   CREATININE 0.97 09/22/2014 0521   CALCIUM 9.0 07/20/2016 1345   CALCIUM 9.0 09/22/2014 0521   GFRNONAA 39 (L) 09/15/2016 1123   GFRNONAA 58 (L) 09/22/2014 0521   GFRNONAA 43 (L) 02/27/2013 1139   GFRAA 45 (L) 09/15/2016 1123   GFRAA >60 09/22/2014 1245  GFRAA 50 (L) 02/27/2013 1139   CrCl cannot be calculated (Patient's most recent lab result is older than the maximum 21 days allowed.).  COAG Lab Results  Component Value Date   INR 1.12 07/20/2016   INR 1.04 06/19/2016    Radiology No results found.   Assessment/Plan 1. 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  The patient does not voice lifestyle limiting changes at this point in time.  Although her ABI's have decreased she is not having pain and her wounds have all healed.  No invasive studies, angiography or surgery at this time The patient should continue walking and begin a more formal exercise program.  The patient should continue antiplatelet therapy and aggressive treatment of the lipid abnormalities  No changes in the patient's medications at this time  The patient should continue wearing graduated compression socks 10-15 mmHg  strength to control the mild edema.    2. Essential hypertension Continue antihypertensive medications as already ordered, these medications have been reviewed and there are no changes at this time.   3. Cerebrovascular accident (CVA) due to embolism of left cerebellar artery (HCC) Continue antihypertensive medications and antiplatelet therapy as already ordered, these medications have been reviewed and there are no changes at this time.     Hortencia Pilar, MD  01/24/2017 11:29 PM

## 2017-01-26 ENCOUNTER — Ambulatory Visit (INDEPENDENT_AMBULATORY_CARE_PROVIDER_SITE_OTHER): Payer: Medicare Other | Admitting: Vascular Surgery

## 2017-01-26 ENCOUNTER — Encounter (INDEPENDENT_AMBULATORY_CARE_PROVIDER_SITE_OTHER): Payer: Self-pay | Admitting: Vascular Surgery

## 2017-01-26 ENCOUNTER — Ambulatory Visit (INDEPENDENT_AMBULATORY_CARE_PROVIDER_SITE_OTHER): Payer: Medicare Other

## 2017-01-26 VITALS — BP 138/69 | HR 66 | Resp 16 | Wt 182.8 lb

## 2017-01-26 DIAGNOSIS — I70263 Atherosclerosis of native arteries of extremities with gangrene, bilateral legs: Secondary | ICD-10-CM | POA: Diagnosis not present

## 2017-01-26 DIAGNOSIS — I63442 Cerebral infarction due to embolism of left cerebellar artery: Secondary | ICD-10-CM

## 2017-01-26 DIAGNOSIS — I10 Essential (primary) hypertension: Secondary | ICD-10-CM

## 2017-02-04 ENCOUNTER — Other Ambulatory Visit: Payer: Self-pay | Admitting: Gastroenterology

## 2017-02-04 DIAGNOSIS — K743 Primary biliary cirrhosis: Secondary | ICD-10-CM

## 2017-02-24 ENCOUNTER — Ambulatory Visit
Admission: RE | Admit: 2017-02-24 | Discharge: 2017-02-24 | Disposition: A | Discharge status: Home / Self Care | Payer: Medicare Other | Source: Ambulatory Visit | Attending: Gastroenterology | Admitting: Gastroenterology

## 2017-02-24 DIAGNOSIS — K743 Primary biliary cirrhosis: Secondary | ICD-10-CM

## 2017-03-02 ENCOUNTER — Ambulatory Visit
Admission: RE | Admit: 2017-03-02 | Discharge: 2017-03-02 | Disposition: A | Discharge status: Home / Self Care | Payer: Medicare Other | Source: Ambulatory Visit | Attending: Gastroenterology | Admitting: Gastroenterology

## 2017-03-02 DIAGNOSIS — K743 Primary biliary cirrhosis: Secondary | ICD-10-CM | POA: Diagnosis not present

## 2017-03-02 DIAGNOSIS — Z905 Acquired absence of kidney: Secondary | ICD-10-CM | POA: Diagnosis not present

## 2017-04-10 ENCOUNTER — Ambulatory Visit: Payer: Medicare Other | Admitting: Anesthesiology

## 2017-04-10 ENCOUNTER — Ambulatory Visit: Payer: Medicare Other

## 2017-04-10 ENCOUNTER — Ambulatory Visit
Admission: RE | Admit: 2017-04-10 | Discharge: 2017-04-10 | Disposition: A | Discharge status: Home / Self Care | Payer: Medicare Other | Source: Ambulatory Visit | Attending: Gastroenterology | Admitting: Gastroenterology

## 2017-04-10 ENCOUNTER — Encounter: Payer: Self-pay | Admitting: Anesthesiology

## 2017-04-10 ENCOUNTER — Encounter
Admission: RE | Disposition: A | Discharge status: Home / Self Care | Payer: Self-pay | Source: Ambulatory Visit | Attending: Gastroenterology

## 2017-04-10 DIAGNOSIS — J439 Emphysema, unspecified: Secondary | ICD-10-CM | POA: Diagnosis not present

## 2017-04-10 DIAGNOSIS — Z8673 Personal history of transient ischemic attack (TIA), and cerebral infarction without residual deficits: Secondary | ICD-10-CM | POA: Insufficient documentation

## 2017-04-10 DIAGNOSIS — E1122 Type 2 diabetes mellitus with diabetic chronic kidney disease: Secondary | ICD-10-CM | POA: Insufficient documentation

## 2017-04-10 DIAGNOSIS — K219 Gastro-esophageal reflux disease without esophagitis: Secondary | ICD-10-CM | POA: Insufficient documentation

## 2017-04-10 DIAGNOSIS — E559 Vitamin D deficiency, unspecified: Secondary | ICD-10-CM | POA: Insufficient documentation

## 2017-04-10 DIAGNOSIS — Z7902 Long term (current) use of antithrombotics/antiplatelets: Secondary | ICD-10-CM | POA: Insufficient documentation

## 2017-04-10 DIAGNOSIS — E785 Hyperlipidemia, unspecified: Secondary | ICD-10-CM | POA: Diagnosis not present

## 2017-04-10 DIAGNOSIS — Z79891 Long term (current) use of opiate analgesic: Secondary | ICD-10-CM | POA: Diagnosis not present

## 2017-04-10 DIAGNOSIS — D519 Vitamin B12 deficiency anemia, unspecified: Secondary | ICD-10-CM | POA: Insufficient documentation

## 2017-04-10 DIAGNOSIS — N183 Chronic kidney disease, stage 3 (moderate): Secondary | ICD-10-CM | POA: Diagnosis not present

## 2017-04-10 DIAGNOSIS — R1310 Dysphagia, unspecified: Secondary | ICD-10-CM | POA: Insufficient documentation

## 2017-04-10 DIAGNOSIS — Z79899 Other long term (current) drug therapy: Secondary | ICD-10-CM | POA: Insufficient documentation

## 2017-04-10 DIAGNOSIS — I130 Hypertensive heart and chronic kidney disease with heart failure and stage 1 through stage 4 chronic kidney disease, or unspecified chronic kidney disease: Secondary | ICD-10-CM | POA: Insufficient documentation

## 2017-04-10 DIAGNOSIS — H409 Unspecified glaucoma: Secondary | ICD-10-CM | POA: Insufficient documentation

## 2017-04-10 DIAGNOSIS — E039 Hypothyroidism, unspecified: Secondary | ICD-10-CM | POA: Diagnosis not present

## 2017-04-10 DIAGNOSIS — M109 Gout, unspecified: Secondary | ICD-10-CM | POA: Insufficient documentation

## 2017-04-10 DIAGNOSIS — K2970 Gastritis, unspecified, without bleeding: Secondary | ICD-10-CM | POA: Insufficient documentation

## 2017-04-10 DIAGNOSIS — Z7982 Long term (current) use of aspirin: Secondary | ICD-10-CM | POA: Insufficient documentation

## 2017-04-10 DIAGNOSIS — F419 Anxiety disorder, unspecified: Secondary | ICD-10-CM | POA: Diagnosis not present

## 2017-04-10 DIAGNOSIS — I509 Heart failure, unspecified: Secondary | ICD-10-CM | POA: Diagnosis not present

## 2017-04-10 DIAGNOSIS — F329 Major depressive disorder, single episode, unspecified: Secondary | ICD-10-CM | POA: Insufficient documentation

## 2017-04-10 DIAGNOSIS — G894 Chronic pain syndrome: Secondary | ICD-10-CM | POA: Insufficient documentation

## 2017-04-10 DIAGNOSIS — E1151 Type 2 diabetes mellitus with diabetic peripheral angiopathy without gangrene: Secondary | ICD-10-CM | POA: Insufficient documentation

## 2017-04-10 HISTORY — DX: Generalized atherosclerosis: I70.91

## 2017-04-10 HISTORY — DX: Other chronic pain: G89.29

## 2017-04-10 HISTORY — DX: Non-pressure chronic ulcer of other part of unspecified foot with unspecified severity: L97.509

## 2017-04-10 HISTORY — PX: ESOPHAGOGASTRODUODENOSCOPY (EGD) WITH PROPOFOL: SHX5813

## 2017-04-10 HISTORY — DX: Type 2 diabetes mellitus without complications: E11.9

## 2017-04-10 HISTORY — DX: Hereditary and idiopathic neuropathy, unspecified: G60.9

## 2017-04-10 HISTORY — DX: Dysphagia, unspecified: R13.10

## 2017-04-10 HISTORY — DX: Tinea unguium: B35.1

## 2017-04-10 HISTORY — DX: Malignant (primary) neoplasm, unspecified: C80.1

## 2017-04-10 HISTORY — DX: Pain in unspecified limb: M79.609

## 2017-04-10 HISTORY — DX: Edema, unspecified: R60.9

## 2017-04-10 HISTORY — DX: Cortical age-related cataract, unspecified eye: H25.019

## 2017-04-10 HISTORY — DX: Unspecified urinary incontinence: R32

## 2017-04-10 HISTORY — DX: Other malaise: R53.81

## 2017-04-10 HISTORY — DX: Hyperlipidemia, unspecified: E78.5

## 2017-04-10 HISTORY — DX: Emphysema, unspecified: J43.9

## 2017-04-10 HISTORY — DX: Other abnormal glucose: R73.09

## 2017-04-10 SURGERY — ESOPHAGOGASTRODUODENOSCOPY (EGD) WITH PROPOFOL
Anesthesia: General

## 2017-04-10 MED ORDER — DEXAMETHASONE SODIUM PHOSPHATE 10 MG/ML IJ SOLN
INTRAMUSCULAR | Status: AC
  Filled 2017-04-10: qty 1

## 2017-04-10 MED ORDER — ONDANSETRON HCL 4 MG/2ML IJ SOLN
INTRAMUSCULAR | Status: DC | PRN
  Administered 2017-04-10: 4 mg via INTRAVENOUS

## 2017-04-10 MED ORDER — PROPOFOL 10 MG/ML IV BOLUS
INTRAVENOUS | Status: AC
  Filled 2017-04-10: qty 20

## 2017-04-10 MED ORDER — FENTANYL CITRATE (PF) 100 MCG/2ML IJ SOLN
INTRAMUSCULAR | Status: AC
  Filled 2017-04-10: qty 2

## 2017-04-10 MED ORDER — ONDANSETRON HCL 4 MG/2ML IJ SOLN
INTRAMUSCULAR | Status: AC
  Filled 2017-04-10: qty 2

## 2017-04-10 MED ORDER — FENTANYL CITRATE (PF) 100 MCG/2ML IJ SOLN
INTRAMUSCULAR | Status: DC | PRN
  Administered 2017-04-10 (×2): 50 ug via INTRAVENOUS

## 2017-04-10 MED ORDER — SODIUM CHLORIDE 0.9 % IV SOLN: INTRAVENOUS | Status: DC

## 2017-04-10 MED ORDER — DEXAMETHASONE SODIUM PHOSPHATE 10 MG/ML IJ SOLN
INTRAMUSCULAR | Status: DC | PRN
  Administered 2017-04-10: 10 mg via INTRAVENOUS

## 2017-04-10 MED ORDER — ONDANSETRON HCL 4 MG/2ML IJ SOLN: 4.0000 mg | Freq: Once | INTRAMUSCULAR | Status: DC | PRN

## 2017-04-10 MED ORDER — SUCCINYLCHOLINE CHLORIDE 20 MG/ML IJ SOLN
INTRAMUSCULAR | Status: DC | PRN
  Administered 2017-04-10: 80 mg via INTRAVENOUS

## 2017-04-10 MED ORDER — SUCCINYLCHOLINE CHLORIDE 20 MG/ML IJ SOLN
INTRAMUSCULAR | Status: AC
  Filled 2017-04-10: qty 1

## 2017-04-10 MED ORDER — PHENYLEPHRINE HCL 10 MG/ML IJ SOLN
INTRAMUSCULAR | Status: DC | PRN
  Administered 2017-04-10: 100 ug via INTRAVENOUS
  Administered 2017-04-10: 50 ug via INTRAVENOUS

## 2017-04-10 MED ORDER — METOPROLOL TARTRATE 5 MG/5ML IV SOLN
INTRAVENOUS | Status: DC | PRN
  Administered 2017-04-10: 2 mg via INTRAVENOUS

## 2017-04-10 MED ORDER — FENTANYL CITRATE (PF) 100 MCG/2ML IJ SOLN: 25.0000 ug | INTRAMUSCULAR | Status: DC | PRN

## 2017-04-10 MED ORDER — PROPOFOL 10 MG/ML IV BOLUS
INTRAVENOUS | Status: DC | PRN
  Administered 2017-04-10 (×2): 50 mg via INTRAVENOUS
  Administered 2017-04-10: 110 mg via INTRAVENOUS

## 2017-04-10 MED ORDER — SODIUM CHLORIDE 0.9 % IV SOLN
INTRAVENOUS | Status: DC
  Administered 2017-04-10: 1000 mL via INTRAVENOUS
  Administered 2017-04-10: 13:00:00 via INTRAVENOUS

## 2017-04-10 NOTE — Anesthesia Preprocedure Evaluation (Signed)
Anesthesia Evaluation  Patient identified by MRN, date of birth, ID band Patient awake    Reviewed: Allergy & Precautions, NPO status , Patient's Chart, lab work & pertinent test results  History of Anesthesia Complications (+) DIFFICULT AIRWAYNegative for: history of anesthetic complications  Airway Mallampati: II       Dental  (+) Upper Dentures, Lower Dentures   Pulmonary shortness of breath and with exertion, asthma , COPD (pt denies),    Pulmonary exam normal        Cardiovascular hypertension, Pt. on medications and Pt. on home beta blockers + Peripheral Vascular Disease  Normal cardiovascular exam     Neuro/Psych  Headaches, PSYCHIATRIC DISORDERS Anxiety Depression  Neuromuscular disease CVA    GI/Hepatic GERD  Medicated and Controlled,  Endo/Other  diabetesHypothyroidism   Renal/GU Renal InsufficiencyRenal disease     Musculoskeletal  (+) Arthritis ,   Abdominal Normal abdominal exam  (+)   Peds negative pediatric ROS (+)  Hematology  (+) anemia ,   Anesthesia Other Findings   Reproductive/Obstetrics                             Anesthesia Physical  Anesthesia Plan  ASA: III  Anesthesia Plan: General   Post-op Pain Management:    Induction: Intravenous  PONV Risk Score and Plan:   Airway Management Planned: Oral ETT  Additional Equipment:   Intra-op Plan:   Post-operative Plan: Extubation in OR  Informed Consent: I have reviewed the patients History and Physical, chart, labs and discussed the procedure including the risks, benefits and alternatives for the proposed anesthesia with the patient or authorized representative who has indicated his/her understanding and acceptance.     Plan Discussed with: CRNA and Surgeon  Anesthesia Plan Comments:         Anesthesia Quick Evaluation

## 2017-04-10 NOTE — Anesthesia Post-op Follow-up Note (Signed)
Anesthesia QCDR form completed.        

## 2017-04-10 NOTE — Transfer of Care (Signed)
Immediate Anesthesia Transfer of Care Note  Patient: [name]  Procedure(s) Performed: Procedure(s): ESOPHAGOGASTRODUODENOSCOPY (EGD) WITH PROPOFOL (N/A)  Patient Location: PACU  Anesthesia Type:General  Level of Consciousness: drowsy and patient cooperative  Airway & Oxygen Therapy: Patient Spontanous Breathing and Patient connected to face mask oxygen  Post-op Assessment: Report given to RN and Post -op Vital signs reviewed and stable  Post vital signs: Reviewed and stable  Last Vitals:  Vitals:   04/10/17 0841 04/10/17 1413  BP: (!) 130/51 (!) 124/57  Pulse: 68 79  Resp: 16 20  Temp: (!) 36.3 C 36.4 C  SpO2: 100% 99%    Last Pain:  Vitals:   04/10/17 0841  TempSrc: Tympanic  PainSc: 8          Complications: No apparent anesthesia complications

## 2017-04-10 NOTE — Anesthesia Procedure Notes (Signed)
Procedure Name: Intubation Date/Time: 04/10/2017 1:20 PM Performed by: Jonna Clark Pre-anesthesia Checklist: Patient identified, Patient being monitored, Timeout performed, Emergency Drugs available and Suction available Patient Re-evaluated:Patient Re-evaluated prior to induction Oxygen Delivery Method: Circle system utilized Preoxygenation: Pre-oxygenation with 100% oxygen Induction Type: IV induction Ventilation: Mask ventilation without difficulty Laryngoscope Size: Mac and 3 Grade View: Grade I Tube type: Oral Tube size: 6.5 mm Number of attempts: 1 Airway Equipment and Method: Stylet Placement Confirmation: ETT inserted through vocal cords under direct vision,  positive ETCO2 and breath sounds checked- equal and bilateral Secured at: 21 cm Tube secured with: Tape Dental Injury: Teeth and Oropharynx as per pre-operative assessment

## 2017-04-10 NOTE — Op Note (Signed)
[facility] Gastroenterology Patient Name: [name] Procedure Date: 04/10/2017 10:32 AM MRN: [id number] Account #: [id number] Date of Birth: 06/30/1928 Admit Type: Outpatient Age: 83 Room: [facility] ENDO ROOM 3 Gender: Female Note Status: Finalized Procedure:            Upper GI endoscopy Providers:            Lollie Sails, MD Referring MD:         Rosaland Lao (Referring MD) Complications:        No immediate complications. Procedure:            Pre-Anesthesia Assessment:                       - ASA Grade Assessment: III - A patient with severe                        systemic disease.                       After obtaining informed consent, the endoscope was                        passed under direct vision. Throughout the procedure,                        the patient's blood pressure, pulse, and oxygen                        saturations were monitored continuously. The Endoscope                        was introduced through the mouth, and advanced to the                        second part of duodenum. The upper GI endoscopy was                        performed with moderate difficulty due to abnormal                        anatomy. [Solution]. Findings:      The H190 scope was placed into the distal esophagus and to a previously       seen ring at the [employer]. Several tries could not pass the scope       into the stomach, however it is noted that the area is friable. The       balloon catheter backloaded with a guide wire was advanced to the GE       junction, and the wire carefully placed into the gastric vault as guided       by fluoroscopy. At this point it was noted some friability in the region       of the GE junction. This scope and baloon was removed, and the infant       gastroscope was placed into esophagus, stomach and duodenum for further       inspection. There was the appearance of the ring having been partially       opened, with  bleeding at the site, however reintroduction of the H190       scope could not be passed into the stomach due  to the narrowness of the       ring.      Patchy mild inflammation characterized by congestion (edema) and       friability was found in the gastric body. Retroflex evaluation shows a       small sliding hiatal hernia.      The visualized duodenum was normal in appearance. Impression:           - Gastritis.                       - No specimens collected. Recommendation:       - Clear liquid diet today.                       - Full liquid diet for 1 day, then advance as tolerated                        to pureed diet.                       - Resume Plavix (clopidogrel) at prior dose tomorrow.                        Refer to managing physician for further adjustment of                        therapy.                       - Repeat upper endoscopy in 3 weeks for retreatment. Procedure Code(s):    --- Professional ---                       [phone], Esophagogastroduodenoscopy, flexible, transoral;                        diagnostic, including collection of specimen(s) by                        brushing or washing, when performed (separate procedure) Diagnosis Code(s):    --- Professional ---                       K29.70, Gastritis, unspecified, without bleeding CPT copyright 2016 American Medical Association. All rights reserved. The codes documented in this report are preliminary and upon coder review may  be revised to meet current compliance requirements. Lollie Sails, MD 04/10/2017 2:16:35 PM This report has been signed electronically. Number of Addenda: 0 Note Initiated On: 04/10/2017 10:32 AM      [facility]

## 2017-04-10 NOTE — Anesthesia Postprocedure Evaluation (Signed)
Anesthesia Post Note  Patient: Lilie Harrower  Procedure(s) Performed: Procedure(s) (LRB): ESOPHAGOGASTRODUODENOSCOPY (EGD) WITH PROPOFOL (N/A)  Patient location during evaluation: PACU Anesthesia Type: General Level of consciousness: awake and alert and oriented Pain management: pain level controlled Vital Signs Assessment: post-procedure vital signs reviewed and stable Respiratory status: spontaneous breathing, nonlabored ventilation and respiratory function stable Cardiovascular status: blood pressure returned to baseline and stable Postop Assessment: no signs of nausea or vomiting Anesthetic complications: no     Last Vitals:  Vitals:   04/10/17 1428 04/10/17 1443  BP: 131/66 138/66  Pulse: 81 74  Resp: 16 19  Temp:    SpO2: 94% 91%    Last Pain:  Vitals:   04/10/17 1443  TempSrc:   PainSc: 0-No pain                 Zeddie Njie

## 2017-04-10 NOTE — H&P (Addendum)
Outpatient short stay form Pre-procedure 04/10/2017 1:01 PM Lollie Sails MD   Primary Physician: Dr. Adrian Prows  Reason for visit:  EGD  History of present illness:  Patient is a 83 year old female presenting today with complaint of dysphagia. She has had esophageal dilatation in the past. I last saw her on an EGD dated 06/19/2016. At that time she had a erosive esophagitis as well as a very narrow ring at the bottom of the esophagus. At that time a wanted to treat her esophagitis before repeating the dilatation. However subsequently she did have a CVA which delayed her return. She is returning today for further evaluation and possible dilatation. She has been taking a proton pump inhibitor once a day. She continues to have dysphagia symptoms. Patient does take Plavix but has held that for 6 days with her last day of dosage being 7 days ago. Platelet count is normal at 253.    Current Facility-Administered Medications:  .  0.9 %  sodium chloride infusion, , Intravenous, Continuous, Lollie Sails, MD, Last Rate: 20 mL/hr at 04/10/17 0902, 1,000 mL at 04/10/17 0902 .  0.9 %  sodium chloride infusion, , Intravenous, Continuous, Lollie Sails, MD .  0.9 %  sodium chloride infusion, , Intravenous, Continuous, Lollie Sails, MD  Facility-Administered Medications Prior to Admission  Medication Dose Route Frequency Provider Last Rate Last Dose  . ceFAZolin (ANCEF) IVPB 1 g/50 mL premix  1 g Intravenous Once Schnier, Dolores Lory, MD       Prescriptions Prior to Admission  Medication Sig Dispense Refill Last Dose  . acetaminophen (TYLENOL) 325 MG tablet Take 650 mg by mouth every 4 (four) hours as needed for mild pain (Dont exceed 300 mg in 24 hours from all sources).   04/09/2017 at Unknown time  . allopurinol (ZYLOPRIM) 100 MG tablet Take 100 mg by mouth daily.   04/09/2017 at Unknown time  . amitriptyline (ELAVIL) 25 MG tablet Take 25 mg by mouth at bedtime.   04/09/2017 at  Unknown time  . aspirin EC 81 MG EC tablet Take 1 tablet (81 mg total) by mouth daily. 30 tablet 0 04/09/2017 at Unknown time  . atorvastatin (LIPITOR) 40 MG tablet Take 1 tablet (40 mg total) by mouth daily at 6 PM. 30 tablet 1 04/09/2017 at Unknown time  . Cholecalciferol (VITAMIN D-3) 1000 UNITS CAPS Take 2,000 Units by mouth daily.    Past Week at Unknown time  . clopidogrel (PLAVIX) 75 MG tablet Take 75 mg by mouth daily with breakfast.   Past Week at Unknown time  . dexlansoprazole (DEXILANT) 60 MG capsule Take 60 mg by mouth daily.   04/09/2017 at Unknown time  . DULoxetine (CYMBALTA) 60 MG capsule Take 60 mg by mouth daily.   04/09/2017 at Unknown time  . furosemide (LASIX) 20 MG tablet Take 1 tablet (20 mg total) by mouth every other day. 30 tablet 0 04/09/2017 at Unknown time  . gabapentin (NEURONTIN) 100 MG capsule Take 100 mg by mouth 3 (three) times daily. 100mg  in am, 300mg  bid at 2pm and 9pm   04/09/2017 at Unknown time  . guaiFENesin (MUCINEX) 600 MG 12 hr tablet Take by mouth 2 (two) times daily.   04/09/2017 at Unknown time  . HYDROcodone-acetaminophen (NORCO/VICODIN) 5-325 MG tablet Take 1 tablet by mouth every 6 (six) hours as needed for moderate pain. 30 tablet 0 04/09/2017 at Unknown time  . latanoprost (XALATAN) 0.005 % ophthalmic solution Place 1 drop into  both eyes at bedtime.   04/09/2017 at Unknown time  . levothyroxine (SYNTHROID, LEVOTHROID) 25 MCG tablet Take 25 mcg by mouth daily before breakfast.   04/10/2017 at 0600  . metoprolol succinate (TOPROL-XL) 25 MG 24 hr tablet Take 25 mg by mouth daily.   04/09/2017 at Unknown time  . montelukast (SINGULAIR) 10 MG tablet Take 10 mg by mouth at bedtime.    04/09/2017 at Unknown time  . oxycodone (OXY-IR) 5 MG capsule Take 5 mg by mouth every 6 (six) hours as needed for pain.   04/09/2017 at Unknown time  . pantoprazole (PROTONIX) 40 MG tablet Take 40 mg by mouth daily at 6 (six) AM.    04/09/2017 at Unknown time  . potassium chloride  (MICRO-K) 10 MEQ CR capsule Take 10 mEq by mouth 2 (two) times daily.   04/09/2017 at Unknown time  . Probiotic Product (ALIGN) 4 MG CAPS Take 4 mg by mouth daily.   04/09/2017 at Unknown time  . senna (SENOKOT) 8.6 MG tablet Take 1 tablet by mouth 3 (three) times daily as needed for constipation.   04/09/2017 at Unknown time  . topiramate (TOPAMAX) 25 MG tablet Take 25 mg by mouth at bedtime.   04/09/2017 at Unknown time  . traMADol (ULTRAM) 50 MG tablet Take 50 mg by mouth every 8 (eight) hours.    04/09/2017 at Unknown time  . ursodiol (ACTIGALL) 300 MG capsule Take 300 mg by mouth 2 (two) times daily.   04/09/2017 at Unknown time  . vitamin B-12 (CYANOCOBALAMIN) 1000 MCG tablet Take 1,000 mcg by mouth daily.   04/09/2017 at Unknown time     No Known Allergies   Past Medical History:  Diagnosis Date  . Abnormal glucose   . Anxiety   . Arthritis   . Asthma   . Calculus of gallbladder without mention of cholecystitis, with obstruction   . Cancer ([place])    kidney  . Candidiasis of the esophagus   . Cataract cortical, senile   . Chronic kidney disease, stage III (moderate)   . Chronic pain   . Chronic pain syndrome   . Corns and callosities   . Debility   . Depression   . Dermatophytosis of nail   . Diabetes mellitus without complication ([place])    unspecified, without mention of complication  . Difficult intubation   . Dysphagia   . Dyspnea    with exertion  . Edema, unspecified   . Emphysema lung ([place])   . Emphysema of lung ([place])   . Esophageal reflux   . Essential hypertension, benign   . Generalized atherosclerosis   . Glaucoma (increased eye pressure)   . Gout, unspecified   . Hemiplegia ([place])   . Hereditary and idiopathic peripheral neuropathy   . Hyperglycemia   . Hyperlipidemia   . Hypertension   . Hypoglycemia, unspecified   . Hypopotassemia   . Hypothyroidism   . Migraine, unspecified, without mention of intractable migraine without mention of status migrainosus    . Other vitamin B12 deficiency anemia   . Pain in limb   . Peripheral vascular disease ([place])   . Personal history of fall   . Stroke ([place])   . Thyroid disease   . Ulcer of foot ([place])   . Unspecified asthma(493.90)   . Unspecified constipation   . Unspecified hypertensive heart disease with heart failure(402.91)   . Unspecified urinary incontinence   . Unspecified vitamin D deficiency     Review of  systems:      Physical Exam    Heart and lungs: Regular rate and rhythm without rub or gallop, lungs are bilaterally clear.    HEENT: Normocephalic atraumatic eyes are anicteric    Other:     Pertinant exam for procedure: Soft nontender nondistended bowel sounds positive normoactive.    Planned proceedures: EGD, esophageal dilatation with indicated procedures. I have discussed the risks benefits and complications of procedures to include not limited to bleeding, infection, perforation and the risk of sedation and the patient wishes to proceed.    Lollie Sails, MD Gastroenterology 04/10/2017  1:01 PM

## 2017-04-13 ENCOUNTER — Encounter: Payer: Self-pay | Admitting: Gastroenterology

## 2017-05-02 ENCOUNTER — Emergency Department: Payer: Medicare Other

## 2017-05-02 ENCOUNTER — Inpatient Hospital Stay
Admission: EM | Admit: 2017-05-02 | Discharge: 2017-05-04 | DRG: 065 | Disposition: A | Discharge status: Skilled Nursing Facility | Payer: Medicare Other | Attending: Internal Medicine | Admitting: Internal Medicine

## 2017-05-02 DIAGNOSIS — H409 Unspecified glaucoma: Secondary | ICD-10-CM | POA: Diagnosis present

## 2017-05-02 DIAGNOSIS — R202 Paresthesia of skin: Secondary | ICD-10-CM

## 2017-05-02 DIAGNOSIS — I639 Cerebral infarction, unspecified: Secondary | ICD-10-CM | POA: Diagnosis not present

## 2017-05-02 DIAGNOSIS — R200 Anesthesia of skin: Secondary | ICD-10-CM

## 2017-05-02 DIAGNOSIS — Z823 Family history of stroke: Secondary | ICD-10-CM

## 2017-05-02 DIAGNOSIS — Z7902 Long term (current) use of antithrombotics/antiplatelets: Secondary | ICD-10-CM

## 2017-05-02 DIAGNOSIS — F329 Major depressive disorder, single episode, unspecified: Secondary | ICD-10-CM | POA: Diagnosis present

## 2017-05-02 DIAGNOSIS — Z9849 Cataract extraction status, unspecified eye: Secondary | ICD-10-CM

## 2017-05-02 DIAGNOSIS — I130 Hypertensive heart and chronic kidney disease with heart failure and stage 1 through stage 4 chronic kidney disease, or unspecified chronic kidney disease: Secondary | ICD-10-CM | POA: Diagnosis present

## 2017-05-02 DIAGNOSIS — G43909 Migraine, unspecified, not intractable, without status migrainosus: Secondary | ICD-10-CM | POA: Diagnosis present

## 2017-05-02 DIAGNOSIS — Z85528 Personal history of other malignant neoplasm of kidney: Secondary | ICD-10-CM

## 2017-05-02 DIAGNOSIS — Z8673 Personal history of transient ischemic attack (TIA), and cerebral infarction without residual deficits: Secondary | ICD-10-CM

## 2017-05-02 DIAGNOSIS — Z808 Family history of malignant neoplasm of other organs or systems: Secondary | ICD-10-CM

## 2017-05-02 DIAGNOSIS — Z90710 Acquired absence of both cervix and uterus: Secondary | ICD-10-CM

## 2017-05-02 DIAGNOSIS — E1122 Type 2 diabetes mellitus with diabetic chronic kidney disease: Secondary | ICD-10-CM | POA: Diagnosis present

## 2017-05-02 DIAGNOSIS — G609 Hereditary and idiopathic neuropathy, unspecified: Secondary | ICD-10-CM | POA: Diagnosis present

## 2017-05-02 DIAGNOSIS — K219 Gastro-esophageal reflux disease without esophagitis: Secondary | ICD-10-CM | POA: Diagnosis present

## 2017-05-02 DIAGNOSIS — E785 Hyperlipidemia, unspecified: Secondary | ICD-10-CM | POA: Diagnosis present

## 2017-05-02 DIAGNOSIS — N183 Chronic kidney disease, stage 3 (moderate): Secondary | ICD-10-CM | POA: Diagnosis present

## 2017-05-02 DIAGNOSIS — M1990 Unspecified osteoarthritis, unspecified site: Secondary | ICD-10-CM | POA: Diagnosis present

## 2017-05-02 DIAGNOSIS — E559 Vitamin D deficiency, unspecified: Secondary | ICD-10-CM | POA: Diagnosis present

## 2017-05-02 DIAGNOSIS — J439 Emphysema, unspecified: Secondary | ICD-10-CM | POA: Diagnosis present

## 2017-05-02 DIAGNOSIS — Z7982 Long term (current) use of aspirin: Secondary | ICD-10-CM

## 2017-05-02 DIAGNOSIS — F419 Anxiety disorder, unspecified: Secondary | ICD-10-CM | POA: Diagnosis present

## 2017-05-02 DIAGNOSIS — G894 Chronic pain syndrome: Secondary | ICD-10-CM | POA: Diagnosis present

## 2017-05-02 DIAGNOSIS — E039 Hypothyroidism, unspecified: Secondary | ICD-10-CM | POA: Diagnosis present

## 2017-05-02 DIAGNOSIS — E1151 Type 2 diabetes mellitus with diabetic peripheral angiopathy without gangrene: Secondary | ICD-10-CM | POA: Diagnosis present

## 2017-05-02 DIAGNOSIS — M109 Gout, unspecified: Secondary | ICD-10-CM | POA: Diagnosis present

## 2017-05-02 DIAGNOSIS — D519 Vitamin B12 deficiency anemia, unspecified: Secondary | ICD-10-CM | POA: Diagnosis present

## 2017-05-02 DIAGNOSIS — Z801 Family history of malignant neoplasm of trachea, bronchus and lung: Secondary | ICD-10-CM

## 2017-05-02 LAB — COMPREHENSIVE METABOLIC PANEL
ALBUMIN: 3.2 g/dL — AB (ref 3.5–5.0)
ALT: 11 U/L — AB (ref 14–54)
AST: 23 U/L (ref 15–41)
Alkaline Phosphatase: 133 U/L — ABNORMAL HIGH (ref 38–126)
Anion gap: 7 (ref 5–15)
BILIRUBIN TOTAL: 0.6 mg/dL (ref 0.3–1.2)
BUN: 15 mg/dL (ref 6–20)
CHLORIDE: 107 mmol/L (ref 101–111)
CO2: 25 mmol/L (ref 22–32)
CREATININE: 1.25 mg/dL — AB (ref 0.44–1.00)
Calcium: 9 mg/dL (ref 8.9–10.3)
GFR calc Af Amer: 44 mL/min — ABNORMAL LOW (ref >60)
GFR calc non Af Amer: 38 mL/min — ABNORMAL LOW (ref >60)
GLUCOSE: 75 mg/dL (ref 65–99)
POTASSIUM: 4.4 mmol/L (ref 3.5–5.1)
Sodium: 139 mmol/L (ref 135–145)
Total Protein: 6.9 g/dL (ref 6.5–8.1)

## 2017-05-02 LAB — DIFFERENTIAL
BASOS ABS: 0.1 10*3/uL (ref 0–0.1)
BASOS PCT: 1 %
EOS ABS: 0.4 10*3/uL (ref 0–0.7)
Eosinophils Relative: 5 %
Lymphocytes Relative: 29 %
Lymphs Abs: 2.2 10*3/uL (ref 1.0–3.6)
Monocytes Absolute: 0.5 10*3/uL (ref 0.2–0.9)
Monocytes Relative: 7 %
Neutro Abs: 4.6 10*3/uL (ref 1.4–6.5)
Neutrophils Relative %: 58 %

## 2017-05-02 LAB — URINALYSIS, COMPLETE (UACMP) WITH MICROSCOPIC
Bilirubin Urine: NEGATIVE
Glucose, UA: NEGATIVE mg/dL
Hgb urine dipstick: NEGATIVE
KETONES UR: NEGATIVE mg/dL
Leukocytes, UA: NEGATIVE
Nitrite: NEGATIVE
PROTEIN: NEGATIVE mg/dL
Specific Gravity, Urine: 1.005 (ref 1.005–1.030)
pH: 7 (ref 5.0–8.0)

## 2017-05-02 LAB — CBC
HCT: 34.6 % — ABNORMAL LOW (ref 35.0–47.0)
Hemoglobin: 11.5 g/dL — ABNORMAL LOW (ref 12.0–16.0)
MCH: 31.6 pg (ref 26.0–34.0)
MCHC: 33.2 g/dL (ref 32.0–36.0)
MCV: 95.4 fL (ref 80.0–100.0)
PLATELETS: 267 10*3/uL (ref 150–440)
RBC: 3.63 MIL/uL — ABNORMAL LOW (ref 3.80–5.20)
RDW: 16.5 % — ABNORMAL HIGH (ref 11.5–14.5)
WBC: 7.7 10*3/uL (ref 3.6–11.0)

## 2017-05-02 LAB — TROPONIN I: Troponin I: <0.03 ng/mL (ref <0.03)

## 2017-05-02 LAB — APTT: APTT: 41 s — AB (ref 24–36)

## 2017-05-02 LAB — PROTIME-INR
INR: 1.08
PROTHROMBIN TIME: 13.9 s (ref 11.4–15.2)

## 2017-05-02 NOTE — ED Triage Notes (Signed)
Pt brought in by [facility] from [facility].  Pt reporting tingling in R leg and R foot that is chronic, but worse today.  Pt reports having neuropathy from previous CVA.  Pt in NAD at this time.

## 2017-05-02 NOTE — ED Provider Notes (Signed)
[facility] ([facility]) Emergency Department Provider Note    First MD Initiated Contact with Patient 05/02/17 1820     (approximate)  I have reviewed the triage vital signs and the nursing notes.   HISTORY  Chief Complaint Tingling (R leg, R foot)    HPI [name] is a 83 y.o. female is a history of stroke as well as peripheral vascular disease presenting with tingling to her right lower extremity below the right knee. Patient states that she does have a history of peripheral neuropathy but has not had tingling in that right leg as far she can recall. No fevers. No trauma. No pain. No nausea or vomiting. No chest pain or palpitations. No shortness of breath. States that she woke up with these symptoms this morning.   Past Medical History:  Diagnosis Date  . Abnormal glucose   . Anxiety   . Arthritis   . Asthma   . Calculus of gallbladder without mention of cholecystitis, with obstruction   . Cancer ([place])    kidney  . Candidiasis of the esophagus   . Cataract cortical, senile   . Chronic kidney disease, stage III (moderate)   . Chronic pain   . Chronic pain syndrome   . Corns and callosities   . Debility   . Depression   . Dermatophytosis of nail   . Diabetes mellitus without complication ([place])    unspecified, without mention of complication  . Difficult intubation   . Dysphagia   . Dyspnea    with exertion  . Edema, unspecified   . Emphysema lung ([place])   . Emphysema of lung ([place])   . Esophageal reflux   . Essential hypertension, benign   . Generalized atherosclerosis   . Glaucoma (increased eye pressure)   . Gout, unspecified   . Hemiplegia ([place])   . Hereditary and idiopathic peripheral neuropathy   . Hyperglycemia   . Hyperlipidemia   . Hypertension   . Hypoglycemia, unspecified   . Hypopotassemia   . Hypothyroidism   . Migraine, unspecified, without mention of intractable migraine without mention of status migrainosus   . Other  vitamin B12 deficiency anemia   . Pain in limb   . Peripheral vascular disease ([place])   . Personal history of fall   . Stroke ([place])   . Thyroid disease   . Ulcer of foot ([place])   . Unspecified asthma(493.90)   . Unspecified constipation   . Unspecified hypertensive heart disease with heart failure(402.91)   . Unspecified urinary incontinence   . Unspecified vitamin D deficiency    Family History  Problem Relation Age of Onset  . Cancer Father   . Stroke Father   . Seizures Mother   . Lung cancer Sister   . Bone cancer Sister    Past Surgical History:  Procedure Laterality Date  . ABDOMINAL HYSTERECTOMY    . arterial thrombi stents in both legs    . BACK SURGERY    . BACK SURGERY    . CATARACT EXTRACTION    . COLONOSCOPY    . ESOPHAGOGASTRODUODENOSCOPY (EGD) WITH PROPOFOL N/A 06/19/2016   Procedure: ESOPHAGOGASTRODUODENOSCOPY (EGD) WITH PROPOFOL;  Surgeon: Lollie Sails, MD;  Location: [facility] ENDOSCOPY;  Service: Endoscopy;  Laterality: N/A;  . ESOPHAGOGASTRODUODENOSCOPY (EGD) WITH PROPOFOL N/A 04/10/2017   Procedure: ESOPHAGOGASTRODUODENOSCOPY (EGD) WITH PROPOFOL;  Surgeon: Lollie Sails, MD;  Location: [facility] ENDOSCOPY;  Service: Endoscopy;  Laterality: N/A;  . EYE SURGERY     cataract  extraction  . LOWER EXTREMITY ANGIOGRAPHY Right 10/07/2016   Procedure: Lower Extremity Angiography;  Surgeon: Katha Cabal, MD;  Location: [place] CV LAB;  Service: Cardiovascular;  Laterality: Right;  . PERIPHERAL VASCULAR CATHETERIZATION Left 09/02/2016   Procedure: Lower Extremity Angiography;  Surgeon: Katha Cabal, MD;  Location: [place] CV LAB;  Service: Cardiovascular;  Laterality: Left;  . PERIPHERAL VASCULAR CATHETERIZATION Right 09/16/2016   Procedure: Lower Extremity Angiography;  Surgeon: Katha Cabal, MD;  Location: [place] CV LAB;  Service: Cardiovascular;  Laterality: Right;   Patient Active Problem List   Diagnosis Date Noted  . Gout  10/27/2016  . CVA (cerebral vascular accident) ([place]) 07/20/2016  . Essential hypertension 07/14/2016  . Atherosclerosis of native arteries of the extremities with gangrene ([place]) 07/14/2016  . PVD (peripheral vascular disease) ([place]) 07/14/2016      Prior to Admission medications   Medication Sig Start Date End Date Taking? Authorizing Provider  acetaminophen (TYLENOL) 325 MG tablet Take 650 mg by mouth every 4 (four) hours as needed for mild pain (Dont exceed 300 mg in 24 hours from all sources).    [provider]  allopurinol (ZYLOPRIM) 100 MG tablet Take 100 mg by mouth daily.    [provider]  amitriptyline (ELAVIL) 25 MG tablet Take 25 mg by mouth at bedtime.    [provider]  aspirin EC 81 MG EC tablet Take 1 tablet (81 mg total) by mouth daily. 07/22/16   Fritzi Mandes, MD  atorvastatin (LIPITOR) 40 MG tablet Take 1 tablet (40 mg total) by mouth daily at 6 PM. 07/22/16   Fritzi Mandes, MD  Cholecalciferol (VITAMIN D-3) 1000 UNITS CAPS Take 2,000 Units by mouth daily.     [provider]  clopidogrel (PLAVIX) 75 MG tablet Take 75 mg by mouth daily with breakfast.    [provider]  dexlansoprazole (DEXILANT) 60 MG capsule Take 60 mg by mouth daily.    [provider]  DULoxetine (CYMBALTA) 60 MG capsule Take 60 mg by mouth daily.    [provider]  furosemide (LASIX) 20 MG tablet Take 1 tablet (20 mg total) by mouth every other day. 07/22/16   Fritzi Mandes, MD  gabapentin (NEURONTIN) 100 MG capsule Take 100 mg by mouth 3 (three) times daily. 100mg  in am, 300mg  bid at 2pm and 9pm    [provider]  guaiFENesin (MUCINEX) 600 MG 12 hr tablet Take by mouth 2 (two) times daily.    [provider]  HYDROcodone-acetaminophen (NORCO/VICODIN) 5-325 MG tablet Take 1 tablet by mouth every 6 (six) hours as needed for moderate pain. 07/22/16   Fritzi Mandes, MD  latanoprost (XALATAN) 0.005 % ophthalmic solution Place 1 drop  into both eyes at bedtime.    [provider]  levothyroxine (SYNTHROID, LEVOTHROID) 25 MCG tablet Take 25 mcg by mouth daily before breakfast.    [provider]  metoprolol succinate (TOPROL-XL) 25 MG 24 hr tablet Take 25 mg by mouth daily.    [provider]  montelukast (SINGULAIR) 10 MG tablet Take 10 mg by mouth at bedtime.     [provider]  oxycodone (OXY-IR) 5 MG capsule Take 5 mg by mouth every 6 (six) hours as needed for pain.    [provider]  pantoprazole (PROTONIX) 40 MG tablet Take 40 mg by mouth daily at 6 (six) AM.     [provider]  potassium chloride (MICRO-K) 10 MEQ CR capsule Take  10 mEq by mouth 2 (two) times daily.    [provider]  Probiotic Product (ALIGN) 4 MG CAPS Take 4 mg by mouth daily.    [provider]  senna (SENOKOT) 8.6 MG tablet Take 1 tablet by mouth 3 (three) times daily as needed for constipation.    [provider]  topiramate (TOPAMAX) 25 MG tablet Take 25 mg by mouth at bedtime.    [provider]  traMADol (ULTRAM) 50 MG tablet Take 50 mg by mouth every 8 (eight) hours.     [provider]  ursodiol (ACTIGALL) 300 MG capsule Take 300 mg by mouth 2 (two) times daily.    [provider]  vitamin B-12 (CYANOCOBALAMIN) 1000 MCG tablet Take 1,000 mcg by mouth daily.    [provider]    Allergies Patient has no known allergies.    Social History Social History  Substance Use Topics  . Smoking status: Never Smoker  . Smokeless tobacco: Never Used  . Alcohol use No    Review of Systems Patient denies headaches, rhinorrhea, blurry vision, numbness, shortness of breath, chest pain, edema, cough, abdominal pain, nausea, vomiting, diarrhea, dysuria, fevers, rashes or hallucinations unless otherwise stated above in HPI. ____________________________________________   PHYSICAL EXAM:  VITAL SIGNS: Vitals:   05/02/17 1829  05/02/17 1853  BP: (!) 128/58 131/63  Pulse: 72 68  Resp: 16 16  Temp: 98.6 F (37 C)   SpO2: 100% 100%    Constitutional: Alert and oriented. in no acute distress. Eyes: Conjunctivae are normal.  Head: Atraumatic. Nose: No congestion/rhinnorhea. Mouth/Throat: Mucous membranes are moist.   Neck: No stridor. Painless ROM.  Cardiovascular: Normal rate, regular rhythm. Grossly normal heart sounds.  Good peripheral circulation. Respiratory: Normal respiratory effort.  No retractions. Lungs CTAB. Gastrointestinal: Soft and nontender. No distention. No abdominal bruits. No CVA tenderness. Genitourinary:  Musculoskeletal: No lower extremity tenderness nor edema.  Warm and well perfused LE bilaterally. No joint effusions. Neurologic:  Normal speech and language. + subjective tingling to RLE below the knee in stocking distribution, chronic RLE motor weakness 2/2 previous cva.  SILT present bilaterally. Skin:  Skin is warm, dry and intact. No rash noted. Psychiatric: Mood and affect are normal. Speech and behavior are normal.  ____________________________________________   LABS (all labs ordered are listed, but only abnormal results are displayed)  Results for orders placed or performed during the hospital encounter of 05/02/17 (from the past 24 hour(s))  Protime-INR     Status: None   Collection Time: 05/02/17  7:16 PM  Result Value Ref Range   Prothrombin Time 13.9 11.4 - 15.2 seconds   INR 1.08   APTT     Status: Abnormal   Collection Time: 05/02/17  7:16 PM  Result Value Ref Range   aPTT 41 (H) 24 - 36 seconds  CBC     Status: Abnormal   Collection Time: 05/02/17  7:16 PM  Result Value Ref Range   WBC 7.7 3.6 - 11.0 K/uL   RBC 3.63 (L) 3.80 - 5.20 MIL/uL   Hemoglobin 11.5 (L) 12.0 - 16.0 g/dL   HCT 34.6 (L) 35.0 - 47.0 %   MCV 95.4 80.0 - 100.0 fL   MCH 31.6 26.0 - 34.0 pg   MCHC 33.2 32.0 - 36.0 g/dL   RDW 16.5 (H) 11.5 - 14.5 %   Platelets 267 150 - 440 K/uL    Differential     Status: None   Collection Time: 05/02/17  7:16 PM  Result Value Ref Range   Neutrophils Relative % 58 %   Neutro Abs 4.6 1.4 - 6.5 K/uL   Lymphocytes Relative 29 %   Lymphs Abs 2.2 1.0 - 3.6 K/uL   Monocytes Relative 7 %   Monocytes Absolute 0.5 0.2 - 0.9 K/uL   Eosinophils Relative 5 %   Eosinophils Absolute 0.4 0 - 0.7 K/uL   Basophils Relative 1 %   Basophils Absolute 0.1 0 - 0.1 K/uL  Comprehensive metabolic panel     Status: Abnormal   Collection Time: 05/02/17  7:16 PM  Result Value Ref Range   Sodium 139 135 - 145 mmol/L   Potassium 4.4 3.5 - 5.1 mmol/L   Chloride 107 101 - 111 mmol/L   CO2 25 22 - 32 mmol/L   Glucose, Bld 75 65 - 99 mg/dL   BUN 15 6 - 20 mg/dL   Creatinine, Ser 1.25 (H) 0.44 - 1.00 mg/dL   Calcium 9.0 8.9 - 10.3 mg/dL   Total Protein 6.9 6.5 - 8.1 g/dL   Albumin 3.2 (L) 3.5 - 5.0 g/dL   AST 23 15 - 41 U/L   ALT 11 (L) 14 - 54 U/L   Alkaline Phosphatase 133 (H) 38 - 126 U/L   Total Bilirubin 0.6 0.3 - 1.2 mg/dL   GFR calc non Af Amer 38 (L) >60 mL/min   GFR calc Af Amer 44 (L) >60 mL/min   Anion gap 7 5 - 15  Troponin I     Status: None   Collection Time: 05/02/17  7:16 PM  Result Value Ref Range   Troponin I <0.03 <0.03 ng/mL  Urinalysis, Complete w Microscopic     Status: Abnormal   Collection Time: 05/02/17  7:45 PM  Result Value Ref Range   Color, Urine STRAW (A) YELLOW   APPearance CLEAR (A) CLEAR   Specific Gravity, Urine 1.005 1.005 - 1.030   pH 7.0 5.0 - 8.0   Glucose, UA NEGATIVE NEGATIVE mg/dL   Hgb urine dipstick NEGATIVE NEGATIVE   Bilirubin Urine NEGATIVE NEGATIVE   Ketones, ur NEGATIVE NEGATIVE mg/dL   Protein, ur NEGATIVE NEGATIVE mg/dL   Nitrite NEGATIVE NEGATIVE   Leukocytes, UA NEGATIVE NEGATIVE   RBC / HPF 0-5 0 - 5 RBC/hpf   WBC, UA 0-5 0 - 5 WBC/hpf   Bacteria, UA RARE (A) NONE SEEN   Squamous Epithelial / LPF 0-5 (A) NONE SEEN   ____________________________________________  EKG My review and  personal interpretation at Time: 19:59   Indication: tingling  Rate: 65  Rhythm: sinus Axis: normal Other: poor r wave progression, no stemi, normal intervals ____________________________________________  RADIOLOGY  I personally reviewed all radiographic images ordered to evaluate for the above acute complaints and reviewed radiology reports and findings.  These findings were personally discussed with the patient.  Please see medical record for radiology report.  ____________________________________________   PROCEDURES  Procedure(s) performed:  Procedures    Critical Care performed: no ____________________________________________   INITIAL IMPRESSION / ASSESSMENT AND PLAN / ED COURSE  Pertinent labs & imaging results that were available during my care of the patient were reviewed by me and considered in my medical decision making (see chart for details).  DDX: cva, tia, hypoglycemia, dehydration, electrolyte abnormality, dissection, sepsis   [name] is a 83 y.o. who presents to the ED with putting on a right leg and right foot is worse today. Since of chronic deficits to the right foot. Does have  chronic neuropathy but does feel that the symptoms have become acutely worse since this morning. Is no physical evidence of limb ischemia. No significant swelling. No evidence of infectious process. CT head shows no evidence of bleed but given her high risk for stroke will order MRI to evaluate for any evidence of infarct noted show acute changes. The patient will be placed on continuous pulse oximetry and telemetry for monitoring.  Laboratory evaluation will be sent to evaluate for the above complaints.       Blood work is reassuring. Patient remains asymptomatic. No progression or change in her symptoms. Patient's son is at bedside and says the patient is currently at her baseline. Due to her extensive history of stroke with worsening subjective paresthesias of the right lower  extremity will continue workup with MRI. Patient will be signed out to Dr. Dahlia Client pending results of MRI. If MRI without any evidence of acute infarct or do believe that she'll be appropriate for further workup as an outpatient.  ____________________________________________   FINAL CLINICAL IMPRESSION(S) / ED DIAGNOSES  Final diagnoses:  Numbness and tingling of right leg      NEW MEDICATIONS STARTED DURING THIS VISIT:  New Prescriptions   No medications on file     Note:  This document was prepared using Dragon voice recognition software and may include unintentional dictation errors.    Merlyn Lot, MD 05/02/17 2250

## 2017-05-02 NOTE — ED Notes (Signed)
Patient transported to MRI at this time. 

## 2017-05-03 ENCOUNTER — Observation Stay: Payer: Medicare Other

## 2017-05-03 ENCOUNTER — Observation Stay
Admit: 2017-05-03 | Discharge: 2017-05-03 | Disposition: A | Discharge status: Home / Self Care | Payer: Medicare Other | Attending: Internal Medicine | Admitting: Internal Medicine

## 2017-05-03 ENCOUNTER — Encounter: Payer: Self-pay | Admitting: Internal Medicine

## 2017-05-03 DIAGNOSIS — R200 Anesthesia of skin: Secondary | ICD-10-CM

## 2017-05-03 DIAGNOSIS — R202 Paresthesia of skin: Secondary | ICD-10-CM

## 2017-05-03 LAB — GLUCOSE, CAPILLARY
GLUCOSE-CAPILLARY: 17 mg/dL — AB (ref 65–99)
Glucose-Capillary: 61 mg/dL — ABNORMAL LOW (ref 65–99)
Glucose-Capillary: 46 mg/dL — ABNORMAL LOW (ref 65–99)
Glucose-Capillary: 49 mg/dL — ABNORMAL LOW (ref 65–99)
Glucose-Capillary: 76 mg/dL (ref 65–99)
Glucose-Capillary: 85 mg/dL (ref 65–99)

## 2017-05-03 LAB — LIPID PANEL
Cholesterol: 135 mg/dL (ref 0–200)
HDL: 41 mg/dL (ref >40)
LDL CALC: 77 mg/dL (ref 0–99)
Total CHOL/HDL Ratio: 3.3 RATIO
Triglycerides: 87 mg/dL (ref <150)
VLDL: 17 mg/dL (ref 0–40)

## 2017-05-03 LAB — MRSA PCR SCREENING: MRSA BY PCR: NEGATIVE

## 2017-05-03 LAB — GLUCOSE, RANDOM: Glucose, Bld: 88 mg/dL (ref 65–99)

## 2017-05-03 MED ORDER — ACETAMINOPHEN 325 MG PO TABS: 650.0000 mg | ORAL_TABLET | ORAL | Status: DC | PRN

## 2017-05-03 MED ORDER — MONTELUKAST SODIUM 10 MG PO TABS
10.0000 mg | ORAL_TABLET | Freq: Every day | ORAL | Status: DC
  Administered 2017-05-03: 22:00:00 10 mg via ORAL
  Filled 2017-05-03: qty 1

## 2017-05-03 MED ORDER — ACETAMINOPHEN 160 MG/5ML PO SOLN
650.0000 mg | ORAL | Status: DC | PRN
  Filled 2017-05-03: qty 20.3

## 2017-05-03 MED ORDER — LEVOTHYROXINE SODIUM 25 MCG PO TABS
25.0000 ug | ORAL_TABLET | Freq: Every day | ORAL | Status: DC
  Administered 2017-05-04: 11:00:00 25 ug via ORAL
  Filled 2017-05-03: qty 1

## 2017-05-03 MED ORDER — URSODIOL 300 MG PO CAPS
300.0000 mg | ORAL_CAPSULE | Freq: Two times a day (BID) | ORAL | Status: DC
  Administered 2017-05-03 – 2017-05-04 (×3): 300 mg via ORAL
  Filled 2017-05-03 (×4): qty 1

## 2017-05-03 MED ORDER — ATORVASTATIN CALCIUM 20 MG PO TABS
40.0000 mg | ORAL_TABLET | Freq: Every day | ORAL | Status: DC
Start: 2017-05-03 — End: 2017-05-04
  Administered 2017-05-03: 18:00:00 40 mg via ORAL
  Filled 2017-05-03: qty 2

## 2017-05-03 MED ORDER — TOPIRAMATE 25 MG PO TABS
25.0000 mg | ORAL_TABLET | Freq: Every day | ORAL | Status: DC
  Administered 2017-05-03: 22:00:00 25 mg via ORAL
  Filled 2017-05-03 (×2): qty 1

## 2017-05-03 MED ORDER — DEXTROSE 5 % IV SOLN
INTRAVENOUS | Status: DC
  Administered 2017-05-03: 08:00:00 via INTRAVENOUS

## 2017-05-03 MED ORDER — PANTOPRAZOLE SODIUM 40 MG PO TBEC
40.0000 mg | DELAYED_RELEASE_TABLET | Freq: Every day | ORAL | Status: DC
  Administered 2017-05-03 – 2017-05-04 (×2): 40 mg via ORAL
  Filled 2017-05-03 (×2): qty 1

## 2017-05-03 MED ORDER — AMITRIPTYLINE HCL 25 MG PO TABS
25.0000 mg | ORAL_TABLET | Freq: Every day | ORAL | Status: DC
  Administered 2017-05-03: 25 mg via ORAL
  Filled 2017-05-03: qty 1

## 2017-05-03 MED ORDER — HYDROCODONE-ACETAMINOPHEN 5-325 MG PO TABS
1.0000 | ORAL_TABLET | Freq: Four times a day (QID) | ORAL | Status: DC | PRN
  Administered 2017-05-04: 1 via ORAL
  Filled 2017-05-03: qty 1

## 2017-05-03 MED ORDER — INFLUENZA VAC SPLIT HIGH-DOSE 0.5 ML IM SUSY
0.5000 mL | PREFILLED_SYRINGE | INTRAMUSCULAR | Status: DC
  Filled 2017-05-03: qty 0.5

## 2017-05-03 MED ORDER — DEXTROSE 50 % IV SOLN
25.0000 mL | Freq: Once | INTRAVENOUS | Status: AC
  Administered 2017-05-03: 25 mL via INTRAVENOUS

## 2017-05-03 MED ORDER — STROKE: EARLY STAGES OF RECOVERY BOOK
Freq: Once | Status: AC
  Administered 2017-05-03: 05:00:00

## 2017-05-03 MED ORDER — ACETAMINOPHEN 650 MG RE SUPP
650.0000 mg | RECTAL | Status: DC | PRN
  Administered 2017-05-03: 05:00:00 650 mg via RECTAL
  Filled 2017-05-03: qty 1

## 2017-05-03 MED ORDER — VITAMIN D 1000 UNITS PO TABS
2000.0000 [IU] | ORAL_TABLET | Freq: Every day | ORAL | Status: DC
  Administered 2017-05-03 – 2017-05-04 (×2): 2000 [IU] via ORAL
  Filled 2017-05-03 (×2): qty 2

## 2017-05-03 MED ORDER — DEXTROSE 50 % IV SOLN
INTRAVENOUS | Status: AC
  Filled 2017-05-03: qty 50

## 2017-05-03 MED ORDER — GABAPENTIN 100 MG PO CAPS: 100.0000 mg | ORAL_CAPSULE | Freq: Three times a day (TID) | ORAL | Status: DC

## 2017-05-03 MED ORDER — ASPIRIN 325 MG PO TABS
325.0000 mg | ORAL_TABLET | Freq: Every day | ORAL | Status: DC
  Administered 2017-05-03 – 2017-05-04 (×2): 325 mg via ORAL
  Filled 2017-05-03 (×2): qty 1

## 2017-05-03 MED ORDER — GABAPENTIN 100 MG PO CAPS
100.0000 mg | ORAL_CAPSULE | Freq: Every morning | ORAL | Status: DC
  Administered 2017-05-03 – 2017-05-04 (×2): 100 mg via ORAL
  Filled 2017-05-03 (×2): qty 1

## 2017-05-03 MED ORDER — DULOXETINE HCL 30 MG PO CPEP
60.0000 mg | ORAL_CAPSULE | Freq: Every day | ORAL | Status: DC
  Administered 2017-05-03 – 2017-05-04 (×2): 60 mg via ORAL
  Filled 2017-05-03 (×2): qty 2

## 2017-05-03 MED ORDER — ALLOPURINOL 100 MG PO TABS
100.0000 mg | ORAL_TABLET | Freq: Every day | ORAL | Status: DC
  Administered 2017-05-03 – 2017-05-04 (×2): 100 mg via ORAL
  Filled 2017-05-03 (×2): qty 1

## 2017-05-03 MED ORDER — ASPIRIN 300 MG RE SUPP: 300.0000 mg | Freq: Every day | RECTAL | Status: DC

## 2017-05-03 MED ORDER — GABAPENTIN 300 MG PO CAPS
300.0000 mg | ORAL_CAPSULE | Freq: Two times a day (BID) | ORAL | Status: DC
  Administered 2017-05-03 – 2017-05-04 (×3): 300 mg via ORAL
  Filled 2017-05-03 (×3): qty 1

## 2017-05-03 MED ORDER — VITAMIN B-12 1000 MCG PO TABS
1000.0000 ug | ORAL_TABLET | Freq: Every day | ORAL | Status: DC
  Administered 2017-05-03 – 2017-05-04 (×2): 1000 ug via ORAL
  Filled 2017-05-03 (×2): qty 1

## 2017-05-03 MED ORDER — LATANOPROST 0.005 % OP SOLN
1.0000 [drp] | Freq: Every day | OPHTHALMIC | Status: DC
  Administered 2017-05-03: 1 [drp] via OPHTHALMIC
  Filled 2017-05-03: qty 2.5

## 2017-05-03 MED ORDER — DEXTROSE 50 % IV SOLN: 25.0000 g | Freq: Once | INTRAVENOUS | Status: DC

## 2017-05-03 MED ORDER — SENNOSIDES-DOCUSATE SODIUM 8.6-50 MG PO TABS: 1.0000 | ORAL_TABLET | Freq: Every evening | ORAL | Status: DC | PRN

## 2017-05-03 MED ORDER — DEXTROSE 50 % IV SOLN
25.0000 mL | Freq: Once | INTRAVENOUS | Status: DC
  Administered 2017-05-03: 09:00:00 25 mL via INTRAVENOUS

## 2017-05-03 NOTE — NC FL2 (Signed)
[place] LEVEL OF CARE SCREENING TOOL     IDENTIFICATION  Patient Name: [name] Birthdate: 1928/04/10 Sex: female Admission Date (Current Location): 05/02/2017  [place] and [name] Number:  Engineering geologist and Address:  [facility], [address] [place], [place] [address]      Provider Number: [id number]  Attending Physician Name and Address:  Hillary Bow, MD  Relative Name and Phone Number:  Ashok Pall [facility]) [phone]    Current Level of Care: Hospital Recommended Level of Care: [place] Prior Approval Number:    Date Approved/Denied:   PASRR Number: [id number]  Discharge Plan: Domiciliary (Rest home)    Current Diagnoses: Patient Active Problem List   Diagnosis Date Noted  . Gout 10/27/2016  . CVA (cerebral vascular accident) ([place]) 07/20/2016  . Essential hypertension 07/14/2016  . Atherosclerosis of native arteries of the extremities with gangrene ([place]) 07/14/2016  . PVD (peripheral vascular disease) ([place]) 07/14/2016    Orientation RESPIRATION BLADDER Height & Weight     Self, Time, Situation, Place  Normal Continent Weight: 178 lb 12.8 oz (81.1 kg) Height:  5\' 5"  (165.1 cm)  BEHAVIORAL SYMPTOMS/MOOD NEUROLOGICAL BOWEL NUTRITION STATUS      Continent    AMBULATORY STATUS COMMUNICATION OF NEEDS Skin   Supervision Verbally Normal                       Personal Care Assistance Level of Assistance  Bathing, Feeding, Dressing Bathing Assistance: Independent Feeding assistance: Independent Dressing Assistance: Independent     Functional Limitations Info             SPECIAL CARE FACTORS FREQUENCY  PT (By licensed PT)     PT Frequency: As ordered by HHPT              Contractures Contractures Info: Not present    Additional Factors Info  Code Status, Allergies, Psychotropic Code Status Info: Full Allergies Info: NKA Psychotropic Info: Cymbalta          Current Medications (05/03/2017):  This is the current hospital active medication list Current Facility-Administered Medications  Medication Dose Route Frequency Provider Last Rate Last Dose  . acetaminophen (TYLENOL) tablet 650 mg  650 mg Oral Q4H PRN Saundra Shelling, MD       Or  . acetaminophen (TYLENOL) solution 650 mg  650 mg Per Tube Q4H PRN Pyreddy, Reatha Harps, MD       Or  . acetaminophen (TYLENOL) suppository 650 mg  650 mg Rectal Q4H PRN Saundra Shelling, MD   650 mg at 05/03/17 0456  . allopurinol (ZYLOPRIM) tablet 100 mg  100 mg Oral Daily Pyreddy, Pavan, MD   100 mg at 05/03/17 1047  . amitriptyline (ELAVIL) tablet 25 mg  25 mg Oral QHS Pyreddy, Pavan, MD      . aspirin suppository 300 mg  300 mg Rectal Daily Pyreddy, Pavan, MD       Or  . aspirin tablet 325 mg  325 mg Oral Daily Pyreddy, Pavan, MD   325 mg at 05/03/17 1047  . atorvastatin (LIPITOR) tablet 40 mg  40 mg Oral q1800 Pyreddy, Reatha Harps, MD      . cholecalciferol (VITAMIN D) tablet 2,000 Units  2,000 Units Oral Daily Saundra Shelling, MD   2,000 Units at 05/03/17 1047  . dextrose 50 % solution           . dextrose 50 % solution           .  DULoxetine (CYMBALTA) DR capsule 60 mg  60 mg Oral Daily Pyreddy, Reatha Harps, MD   60 mg at 05/03/17 1047  . gabapentin (NEURONTIN) capsule 100 mg  100 mg Oral q morning - 10a Pyreddy, Reatha Harps, MD   100 mg at 05/03/17 1047   And  . gabapentin (NEURONTIN) capsule 300 mg  300 mg Oral BID Saundra Shelling, MD      . HYDROcodone-acetaminophen (NORCO/VICODIN) 5-325 MG per tablet 1 tablet  1 tablet Oral Q6H PRN Saundra Shelling, MD      . Derrill Memo ON 05/04/2017] Influenza vac split quadrivalent PF (FLUZONE HIGH-DOSE) injection 0.5 mL  0.5 mL Intramuscular Tomorrow-1000 Pyreddy, Pavan, MD      . latanoprost (XALATAN) 0.005 % ophthalmic solution 1 drop  1 drop Both Eyes QHS Pyreddy, Pavan, MD      . levothyroxine (SYNTHROID, LEVOTHROID) tablet 25 mcg  25 mcg Oral QAC breakfast Pyreddy, Pavan, MD      . montelukast  (SINGULAIR) tablet 10 mg  10 mg Oral QHS Pyreddy, Pavan, MD      . pantoprazole (PROTONIX) EC tablet 40 mg  40 mg Oral Daily Pyreddy, Pavan, MD   40 mg at 05/03/17 1047  . senna-docusate (Senokot-S) tablet 1 tablet  1 tablet Oral QHS PRN Saundra Shelling, MD      . topiramate (TOPAMAX) tablet 25 mg  25 mg Oral QHS Pyreddy, Pavan, MD      . ursodiol (ACTIGALL) capsule 300 mg  300 mg Oral BID Saundra Shelling, MD   300 mg at 05/03/17 1047  . vitamin B-12 (CYANOCOBALAMIN) tablet 1,000 mcg  1,000 mcg Oral Daily Pyreddy, Reatha Harps, MD   1,000 mcg at 05/03/17 1047     Discharge Medications: Please see discharge summary for a list of discharge medications.  Current Discharge Medication List       CONTINUE these medications which have NOT CHANGED   Details  acetaminophen (TYLENOL) 325 MG tablet Take 650 mg by mouth every 4 (four) hours as needed for mild pain (Dont exceed 300 mg in 24 hours from all sources).    allopurinol (ZYLOPRIM) 100 MG tablet Take 100 mg by mouth daily.    aspirin EC 81 MG EC tablet Take 1 tablet (81 mg total) by mouth daily. Qty: 30 tablet, Refills: 0    atorvastatin (LIPITOR) 40 MG tablet Take 1 tablet (40 mg total) by mouth daily at 6 PM. Qty: 30 tablet, Refills: 1    Cholecalciferol (VITAMIN D-3) 1000 UNITS CAPS Take 2,000 Units by mouth daily.     clopidogrel (PLAVIX) 75 MG tablet Take 75 mg by mouth daily with breakfast.    DULoxetine (CYMBALTA) 60 MG capsule Take 60 mg by mouth daily.    furosemide (LASIX) 20 MG tablet Take 1 tablet (20 mg total) by mouth every other day. Qty: 30 tablet, Refills: 0    gabapentin (NEURONTIN) 100 MG capsule Take 100 mg by mouth 3 (three) times daily. 100mg  in am, 300mg  bid at 2pm and 9pm    guaiFENesin (MUCINEX) 600 MG 12 hr tablet Take by mouth 2 (two) times daily.    latanoprost (XALATAN) 0.005 % ophthalmic solution Place 1 drop into both eyes at bedtime.    levothyroxine (SYNTHROID, LEVOTHROID) 25 MCG tablet Take  25 mcg by mouth daily before breakfast.    metoprolol succinate (TOPROL-XL) 25 MG 24 hr tablet Take 25 mg by mouth daily.    montelukast (SINGULAIR) 10 MG tablet Take 10 mg by mouth at bedtime.  oxycodone (OXY-IR) 5 MG capsule Take 5 mg by mouth every 6 (six) hours as needed for pain.    pantoprazole (PROTONIX) 40 MG tablet Take 40 mg by mouth daily at 6 (six) AM.     potassium chloride (MICRO-K) 10 MEQ CR capsule Take 10 mEq by mouth 2 (two) times daily.    Probiotic Product (ALIGN) 4 MG CAPS Take 4 mg by mouth daily.    senna (SENOKOT) 8.6 MG tablet Take 1 tablet by mouth 3 (three) times daily as needed for constipation.    topiramate (TOPAMAX) 25 MG tablet Take 25 mg by mouth at bedtime.    traMADol (ULTRAM) 50 MG tablet Take 50 mg by mouth every 8 (eight) hours.     ursodiol (ACTIGALL) 300 MG capsule Take 300 mg by mouth 2 (two) times daily.    vitamin B-12 (CYANOCOBALAMIN) 1000 MCG tablet Take 1,000 mcg by mouth daily.    amitriptyline (ELAVIL) 25 MG tablet Take 25 mg by mouth at bedtime.    dexlansoprazole (DEXILANT) 60 MG capsule Take 60 mg by mouth daily.    HYDROcodone-acetaminophen (NORCO/VICODIN) 5-325 MG tablet Take 1 tablet by mouth every 6 (six) hours as needed for moderate pain. Qty: 30 tablet, Refills: 0       Relevant Imaging Results:  Relevant Lab Results:   Additional Information [id number]  Zettie Pho, LCSW

## 2017-05-03 NOTE — H&P (Signed)
[facility] [place] NAME: [name]    MR#:  [id number]  DATE OF BIRTH:  Oct 04, 1927  DATE OF ADMISSION:  05/02/2017  PRIMARY CARE PHYSICIAN: Rosaland Lao, NP   REQUESTING/REFERRING PHYSICIAN:   CHIEF COMPLAINT:   Chief Complaint  Patient presents with  . Tingling    R leg, R foot    HISTORY OF PRESENT ILLNESS: [name]  is a 82 y.o. female with a known history of chronic kidney disease stage III, bronchial asthma, CVA with right-sided weakness ,chronic pain, diabetes mellitus presented to the emergency room with tingling in the right foot and right lower extremity. This has been going on since yesterday. No complaints of any slurred speech, difficulty swallowing food. Patient has some weakness in the right leg from the last stroke. She was evaluated in the emergency room with CT head and MRI brain. MRI brain showed a new left parietal infarct along with old infarcts.hospitalist service was consulted for further care of the patient. No complaints of any chest pain, shortness of breath. No headache dizziness and blurry vision.  PAST MEDICAL HISTORY:   Past Medical History:  Diagnosis Date  . Abnormal glucose   . Anxiety   . Arthritis   . Asthma   . Calculus of gallbladder without mention of cholecystitis, with obstruction   . Cancer ([place])    kidney  . Candidiasis of the esophagus   . Cataract cortical, senile   . Chronic kidney disease, stage III (moderate)   . Chronic pain   . Chronic pain syndrome   . Corns and callosities   . Debility   . Depression   . Dermatophytosis of nail   . Diabetes mellitus without complication ([place])    unspecified, without mention of complication  . Difficult intubation   . Dysphagia   . Dyspnea    with exertion  . Edema, unspecified   . Emphysema lung ([place])   . Emphysema of lung ([place])   . Esophageal reflux   . Essential hypertension, benign   . Generalized  atherosclerosis   . Glaucoma (increased eye pressure)   . Gout, unspecified   . Hemiplegia ([place])   . Hereditary and idiopathic peripheral neuropathy   . Hyperglycemia   . Hyperlipidemia   . Hypertension   . Hypoglycemia, unspecified   . Hypopotassemia   . Hypothyroidism   . Migraine, unspecified, without mention of intractable migraine without mention of status migrainosus   . Other vitamin B12 deficiency anemia   . Pain in limb   . Peripheral vascular disease ([place])   . Personal history of fall   . Stroke ([place])   . Thyroid disease   . Ulcer of foot ([place])   . Unspecified asthma(493.90)   . Unspecified constipation   . Unspecified hypertensive heart disease with heart failure(402.91)   . Unspecified urinary incontinence   . Unspecified vitamin D deficiency     PAST SURGICAL HISTORY: Past Surgical History:  Procedure Laterality Date  . ABDOMINAL HYSTERECTOMY    . arterial thrombi stents in both legs    . BACK SURGERY    . BACK SURGERY    . CATARACT EXTRACTION    . COLONOSCOPY    . ESOPHAGOGASTRODUODENOSCOPY (EGD) WITH PROPOFOL N/A 06/19/2016   Procedure: ESOPHAGOGASTRODUODENOSCOPY (EGD) WITH PROPOFOL;  Surgeon: Lollie Sails, MD;  Location: [facility] ENDOSCOPY;  Service: Endoscopy;  Laterality: N/A;  . ESOPHAGOGASTRODUODENOSCOPY (EGD) WITH PROPOFOL N/A 04/10/2017   Procedure:  ESOPHAGOGASTRODUODENOSCOPY (EGD) WITH PROPOFOL;  Surgeon: Lollie Sails, MD;  Location: [facility] ENDOSCOPY;  Service: Endoscopy;  Laterality: N/A;  . EYE SURGERY     cataract extraction  . LOWER EXTREMITY ANGIOGRAPHY Right 10/07/2016   Procedure: Lower Extremity Angiography;  Surgeon: Katha Cabal, MD;  Location: [place] CV LAB;  Service: Cardiovascular;  Laterality: Right;  . PERIPHERAL VASCULAR CATHETERIZATION Left 09/02/2016   Procedure: Lower Extremity Angiography;  Surgeon: Katha Cabal, MD;  Location: [place] CV LAB;  Service: Cardiovascular;  Laterality: Left;  . PERIPHERAL  VASCULAR CATHETERIZATION Right 09/16/2016   Procedure: Lower Extremity Angiography;  Surgeon: Katha Cabal, MD;  Location: [place] CV LAB;  Service: Cardiovascular;  Laterality: Right;    SOCIAL HISTORY:  Social History  Substance Use Topics  . Smoking status: Never Smoker  . Smokeless tobacco: Never Used  . Alcohol use No    FAMILY HISTORY:  Family History  Problem Relation Age of Onset  . Cancer Father   . Stroke Father   . Seizures Mother   . Lung cancer Sister   . Bone cancer Sister     DRUG ALLERGIES: No Known Allergies  REVIEW OF SYSTEMS:   CONSTITUTIONAL: No fever, fatigue or weakness.  EYES: No blurred or double vision.  EARS, NOSE, AND THROAT: No tinnitus or ear pain.  RESPIRATORY: No cough, shortness of breath, wheezing or hemoptysis.  CARDIOVASCULAR: No chest pain, orthopnea, edema.  GASTROINTESTINAL: No nausea, vomiting, diarrhea or abdominal pain.  GENITOURINARY: No dysuria, hematuria.  ENDOCRINE: No polyuria, nocturia,  HEMATOLOGY: No anemia, easy bruising or bleeding SKIN: No rash or lesion. MUSCULOSKELETAL: No joint pain or arthritis.   Right leg tingling and numbness NEUROLOGIC: Right leg tingling, numbness, weakness.  PSYCHIATRY: No anxiety or depression.   MEDICATIONS AT HOME:  Prior to Admission medications   Medication Sig Start Date End Date Taking? Authorizing Provider  acetaminophen (TYLENOL) 325 MG tablet Take 650 mg by mouth every 4 (four) hours as needed for mild pain (Dont exceed 300 mg in 24 hours from all sources).   Yes [provider]  allopurinol (ZYLOPRIM) 100 MG tablet Take 100 mg by mouth daily.   Yes [provider]  aspirin EC 81 MG EC tablet Take 1 tablet (81 mg total) by mouth daily. 07/22/16  Yes Fritzi Mandes, MD  atorvastatin (LIPITOR) 40 MG tablet Take 1 tablet (40 mg total) by mouth daily at 6 PM. 07/22/16  Yes Fritzi Mandes, MD  Cholecalciferol (VITAMIN D-3) 1000 UNITS CAPS Take 2,000 Units by mouth  daily.    Yes [provider]  clopidogrel (PLAVIX) 75 MG tablet Take 75 mg by mouth daily with breakfast.   Yes [provider]  DULoxetine (CYMBALTA) 60 MG capsule Take 60 mg by mouth daily.   Yes [provider]  furosemide (LASIX) 20 MG tablet Take 1 tablet (20 mg total) by mouth every other day. 07/22/16  Yes Fritzi Mandes, MD  gabapentin (NEURONTIN) 100 MG capsule Take 100 mg by mouth 3 (three) times daily. 100mg  in am, 300mg  bid at 2pm and 9pm   Yes [provider]  guaiFENesin (MUCINEX) 600 MG 12 hr tablet Take by mouth 2 (two) times daily.   Yes [provider]  latanoprost (XALATAN) 0.005 % ophthalmic solution Place 1 drop into both eyes at bedtime.   Yes [provider]  levothyroxine (SYNTHROID, LEVOTHROID) 25 MCG tablet Take 25 mcg by mouth daily before breakfast.   Yes [provider]  metoprolol succinate (TOPROL-XL) 25 MG 24 hr tablet Take 25 mg by mouth daily.   Yes [provider]  montelukast (SINGULAIR) 10 MG tablet Take 10 mg by mouth at bedtime.    Yes [provider]  oxycodone (OXY-IR) 5 MG capsule Take 5 mg by mouth every 6 (six) hours as needed for pain.   Yes [provider]  pantoprazole (PROTONIX) 40 MG tablet Take 40 mg by mouth daily at 6 (six) AM.    Yes [provider]  potassium chloride (MICRO-K) 10 MEQ CR capsule Take 10 mEq by mouth 2 (two) times daily.   Yes [provider]  Probiotic Product (ALIGN) 4 MG CAPS Take 4 mg by mouth daily.   Yes [provider]  senna (SENOKOT) 8.6 MG tablet Take 1 tablet by mouth 3 (three) times daily as needed for constipation.   Yes [provider]  topiramate (TOPAMAX) 25 MG tablet Take 25 mg by mouth at bedtime.   Yes [provider]  traMADol (ULTRAM) 50 MG tablet Take 50 mg by mouth every 8 (eight) hours.    Yes [provider]  ursodiol (ACTIGALL) 300 MG capsule Take 300 mg by mouth 2  (two) times daily.   Yes [provider]  vitamin B-12 (CYANOCOBALAMIN) 1000 MCG tablet Take 1,000 mcg by mouth daily.   Yes [provider]  amitriptyline (ELAVIL) 25 MG tablet Take 25 mg by mouth at bedtime.    [provider]  dexlansoprazole (DEXILANT) 60 MG capsule Take 60 mg by mouth daily.    [provider]  HYDROcodone-acetaminophen (NORCO/VICODIN) 5-325 MG tablet Take 1 tablet by mouth every 6 (six) hours as needed for moderate pain. 07/22/16   Fritzi Mandes, MD      PHYSICAL EXAMINATION:   VITAL SIGNS: Blood pressure (!) 163/88, pulse 73, temperature 98.6 F (37 C), temperature source Oral, resp. rate 18, height 5\' 4"  (1.626 m), weight 81.6 kg (180 lb), SpO2 98 %.  GENERAL:  83 y.o.-year-old patient lying in the bed with no acute distress.  EYES: Pupils equal, round, reactive to light and accommodation. No scleral icterus. Extraocular muscles intact.  HEENT: Head atraumatic, normocephalic. Oropharynx and nasopharynx clear.  NECK:  Supple, no jugular venous distention. No thyroid enlargement, no tenderness.  LUNGS: Normal breath sounds bilaterally, no wheezing, rales,rhonchi or crepitation. No use of accessory muscles of respiration.  CARDIOVASCULAR: S1, S2 normal. No murmurs, rubs, or gallops.  ABDOMEN: Soft, nontender, nondistended. Bowel sounds present. No organomegaly or mass.  EXTREMITIES: No pedal edema, cyanosis, or clubbing.  NEUROLOGIC: Cranial nerves II through XII are intact. Muscle strength 5/5 in all extremities except 4/5 right leg. Sensation intact. Gait not checked.  Tingling sensation and numbness right leg PSYCHIATRIC: The patient is alert and oriented x 3.  SKIN: No obvious rash, lesion, or ulcer.   LABORATORY PANEL:   CBC  Recent Labs Lab 05/02/17 1916  WBC 7.7  HGB 11.5*  HCT 34.6*  PLT 267  MCV 95.4  MCH 31.6  MCHC 33.2  RDW 16.5*  LYMPHSABS 2.2  MONOABS 0.5  EOSABS 0.4  BASOSABS 0.1    ------------------------------------------------------------------------------------------------------------------  Chemistries   Recent Labs Lab 05/02/17 1916  NA 139  K 4.4  CL 107  CO2 25  GLUCOSE 75  BUN 15  CREATININE 1.25*  CALCIUM 9.0  AST 23  ALT 11*  ALKPHOS 133*  BILITOT 0.6   ------------------------------------------------------------------------------------------------------------------ estimated creatinine clearance is 32.8 mL/min (A) (by  C-G formula based on SCr of 1.25 mg/dL (H)). ------------------------------------------------------------------------------------------------------------------ No results for input(s): TSH, T4TOTAL, T3FREE, THYROIDAB in the last 72 hours.  Invalid input(s): FREET3   Coagulation profile  Recent Labs Lab 05/02/17 1916  INR 1.08   ------------------------------------------------------------------------------------------------------------------- No results for input(s): DDIMER in the last 72 hours. -------------------------------------------------------------------------------------------------------------------  Cardiac Enzymes  Recent Labs Lab 05/02/17 1916  TROPONINI <0.03   ------------------------------------------------------------------------------------------------------------------ Invalid input(s): POCBNP  ---------------------------------------------------------------------------------------------------------------  Urinalysis    Component Value Date/Time   COLORURINE STRAW (A) 05/02/2017 1945   APPEARANCEUR CLEAR (A) 05/02/2017 1945   APPEARANCEUR Clear 09/14/2014 1219   LABSPEC 1.005 05/02/2017 1945   LABSPEC 1.015 09/14/2014 1219   PHURINE 7.0 05/02/2017 1945   GLUCOSEU NEGATIVE 05/02/2017 1945   GLUCOSEU Negative 09/14/2014 1219   HGBUR NEGATIVE 05/02/2017 1945   BILIRUBINUR NEGATIVE 05/02/2017 1945   BILIRUBINUR Negative 09/14/2014 1219   KETONESUR NEGATIVE 05/02/2017 1945   PROTEINUR  NEGATIVE 05/02/2017 1945   NITRITE NEGATIVE 05/02/2017 1945   LEUKOCYTESUR NEGATIVE 05/02/2017 1945   LEUKOCYTESUR Negative 09/14/2014 1219     RADIOLOGY: Ct Head Wo Contrast  Result Date: 05/02/2017 CLINICAL DATA:  Right leg weakness and numbness that started yesterday morning. EXAM: CT HEAD WITHOUT CONTRAST TECHNIQUE: Contiguous axial images were obtained from the base of the skull through the vertex without intravenous contrast. COMPARISON:  Brain MRI 07/21/2016 FINDINGS: Brain: No evidence of acute infarction, hemorrhage, hydrocephalus, extra-axial collection or mass lesion/mass effect. Multiple clustered small to moderate bilateral remote cerebellar infarcts. Remote left pons infarct. Remote white matter infarct at the anterior limb left internal capsule, also seen 07/20/2016 by head CT. Chronic microvascular ischemic changes in the cerebral white matter. Brain atrophy, underestimated due to hyperostosis. Vascular: Atherosclerotic calcification. No hyperdense vessels suspected. Skull: No acute or aggressive finding.  Hyperostosis. Sinuses/Orbits: No acute finding.  Bilateral cataract resection. IMPRESSION: 1. No acute finding when compared to prior. 2. Remote bilateral cerebellar, left pontine, and left internal capsule infarcts. Electronically Signed   By: Monte Fantasia M.D.   On: 05/02/2017 18:55   Mr Brain Wo Contrast  Result Date: 05/03/2017 CLINICAL DATA:  RIGHT lower extremity tingling. Assess for stroke. History of peripheral neuropathy, hypertension, cancer, hyperlipidemia. EXAM: MRI HEAD WITHOUT CONTRAST TECHNIQUE: Multiplanar, multiecho pulse sequences of the brain and surrounding structures were obtained without intravenous contrast. COMPARISON:  CT HEAD May 02, 2017 at 1838 hours and MRI of the head July 21, 2016 FINDINGS: BRAIN: 8 mm LEFT parietal cortex reduced diffusion with low ADC value. No susceptibility artifact to suggest hemorrhage. Old large bilateral cerebellar  infarcts. Patchy to confluent supratentorial white matter FLAIR T2 hyperintensities. Old LEFT pontine and ventral medullary infarcts. Old LEFT basal ganglia to internal capsule infarct. Faint T2 hyperintensities bilateral basal ganglia and thalamus associated with chronic small vessel ischemic disease. Ventricles and sulci are normal for patient's age. No midline shift, mass effect or masses. No abnormal extra-axial fluid collections. VASCULAR: Major intracranial vascular flow voids present at skull base. Dolichoectatic intracranial vessels seen with chronic hypertension. SKULL AND UPPER CERVICAL SPINE: No abnormal sellar expansion. No suspicious calvarial bone marrow signal. Craniocervical junction maintained. SINUSES/ORBITS: LEFT maxillary sinus mucosal thickening without air-fluid levels. Mastoid air cells are well aerated. The included ocular globes and orbital contents are non-suspicious. Status post bilateral ocular lens implants. OTHER: Patient is edentulous. IMPRESSION: 1. Acute subcentimeter LEFT parietal infarct. 2. Multiple old lacunar infarcts, old cerebellar infarcts. 3. Moderate chronic small vessel ischemic disease. Electronically Signed   By: Elon Alas  M.D.   On: 05/03/2017 00:59    EKG: Orders placed or performed during the hospital encounter of 05/02/17  . ED EKG  . ED EKG  . EKG 12-Lead  . EKG 12-Lead  . EKG 12-Lead  . EKG 12-Lead    IMPRESSION AND PLAN: 83 year old female patient with history of diabetes mellitus, COPD, CVA with right leg weakness chronic pain presented to the emergency room with tingling sensation in the right lower extremity.  Admitting diagnosis : 1. Left parietal CVA 2. Diabetes mellitus type II 3.CKD stage III 4.Emphysema Treatment plan Admit patient to medical for observation bed Aspirin 325 mg orally daily Neurology consultation Neuro check For 24 hours Monitor renal function Carotid ultrasound to rule out obstruction Check  echocardiogram Physical therapy evaluation  All the records are reviewed and case discussed with ED provider. Management plans discussed with the patient, family and they are in agreement.  CODE STATUS:FULL CODE Code Status History    Date Active Date Inactive Code Status Order ID Comments User Context   10/07/2016 12:03 PM 10/07/2016  5:38 PM Full Code [id number]  Katha Cabal, MD Inpatient   09/16/2016 10:27 AM 09/16/2016  5:11 PM Full Code [id number]  Delana Meyer, Dolores Lory, MD Inpatient   09/02/2016 10:51 AM 09/02/2016  4:01 PM Full Code [id number]  Katha Cabal, MD Inpatient   07/22/2016 10:02 AM 07/22/2016 11:18 PM DNR [id number]  Fritzi Mandes, MD Inpatient   07/20/2016  2:55 PM 07/22/2016 10:02 AM Full Code [id number]  Hillary Bow, MD ED       TOTAL TIME TAKING CARE OF THIS PATIENT: 50 minutes.    Saundra Shelling M.D on 05/03/2017 at 3:25 AM  Between 7am to 6pm - Pager - [phone]  After 6pm go to [email or web address] - password [email or web address] [facility]  Office  [phone]  CC: Primary care physician; Rosaland Lao, NP

## 2017-05-03 NOTE — ED Provider Notes (Signed)
-----------------------------------------   1:10 AM on 05/03/2017 -----------------------------------------   Blood pressure (!) 163/72, pulse 64, temperature 98.6 F (37 C), temperature source Oral, resp. rate 18, height 5\' 4"  (1.626 m), weight 81.6 kg (180 lb), SpO2 97 %.  Assuming care from Dr. Quentin Cornwall.  In short, [name] is a 83 y.o. female with a chief complaint of Tingling (R leg, R foot) .  Refer to the original H&P for additional details.  The current plan of care is to follow up the results of the MRI.   Clinical Course as of May 03 109  Sun May 03, 2017  0108 1. Acute subcentimeter LEFT parietal infarct. 2. Multiple old lacunar infarcts, old cerebellar infarcts. 3. Moderate chronic small vessel ischemic disease.   MR Brain Wo Contrast [AW]  5750 it appears that the patient has an acute infarct. She will be admitted to the hospitalist service for further evaluation.  [AW]    Clinical Course User Index [AW] Dahlia Client Eduard Roux, MD   I will admit the patient to the hospitalist service for further evaluation of her acute stroke.    Loney Hering, MD 05/03/17 0111

## 2017-05-03 NOTE — Progress Notes (Signed)
Physical Therapy Evaluation Patient Details Name: [name] MRN: [id number] DOB: [birth year] Today's Date: 05/03/2017   History of Present Illness  [name]  is a 83 y.o. female with a known history of chronic kidney disease stage III, bronchial asthma, CVA with right-sided weakness ,chronic pain, diabetes mellitus presented to the emergency room with tingling in the right foot and right lower extremity. This has been going on since yesterday. No complaints of any slurred speech, difficulty swallowing food. Patient has some weakness in the right leg from the last stroke. She was evaluated in the emergency room with CT head and MRI brain. MRI brain showed a new left parietal infarct along with old infarcts.hospitalist service was consulted for further care of the patient. No complaints of any chest pain, shortness of breath. No headache dizziness and blurry vision.  Clinical Impression  Patient presents with modified independent mobility for bed, transfers and gait. She is able to perform all mobility with slow movements. She has decreased RLE strength -3/5 hip , 3/5 knee. She ambulates 200 feet with RW with slow gait speed and decreased foot height bilaterally. She needs the bed rail to perform supine to sit bed mobility. She has good sitting balance and fair standing balance with RW. She will benefit from skilled PT to improve mobility and strength. She will benefit from [place] upon DC.     Follow Up Recommendations Home health PT    Equipment Recommendations  Rolling walker with 5" wheels    Recommendations for Other Services       Precautions / Restrictions Precautions Precautions: None Restrictions Weight Bearing Restrictions: No      Mobility  Bed Mobility Overal bed mobility: Modified Independent             General bed mobility comments: needs bed rail for supine to sit  Transfers Overall transfer level: Modified independent Equipment used: Rolling walker (2  wheeled)                Ambulation/Gait Ambulation/Gait assistance: Modified independent (Device/Increase time) Ambulation Distance (Feet): 200 Feet Assistive device: Rolling walker (2 wheeled) Gait Pattern/deviations: Step-to pattern   Gait velocity interpretation: <1.8 ft/sec, indicative of risk for recurrent falls General Gait Details: slow gait speed  Stairs            Wheelchair Mobility    Modified Rankin (Stroke Patients Only)       Balance Overall balance assessment: No apparent balance deficits (not formally assessed)                                           Pertinent Vitals/Pain Pain Assessment: No/denies pain    Home Living Family/patient expects to be discharged to:: Assisted living               Home Equipment: Walker - 4 wheels;Cane - single point      Prior Function Level of Independence: Independent with assistive device(s)         Comments: Assist from facility staff for ADLs; mod indep with household distances and amb to/from dining hall (for all meals).  Enjoys Bingo at facility.     Hand Dominance   Dominant Hand: Right    Extremity/Trunk Assessment   Upper Extremity Assessment Upper Extremity Assessment: Overall WFL for tasks assessed    Lower Extremity Assessment Lower Extremity Assessment: Overall WFL for tasks assessed  Cervical / Trunk Assessment Cervical / Trunk Assessment: Normal  Communication   Communication: No difficulties  Cognition Arousal/Alertness: Awake/alert Behavior During Therapy: WFL for tasks assessed/performed Overall Cognitive Status: Within Functional Limits for tasks assessed                                        General Comments      Exercises     Assessment/Plan    PT Assessment Patient needs continued PT services  PT Problem List Decreased strength;Decreased activity tolerance;Decreased balance;Decreased mobility       PT Treatment  Interventions Gait training;Therapeutic activities;Therapeutic exercise;Balance training    PT Goals (Current goals can be found in the Care Plan section)  Acute Rehab PT Goals Patient Stated Goal: to walk better PT Goal Formulation: With patient Time For Goal Achievement: 05/17/17 Potential to Achieve Goals: Good    Frequency Min 2X/week   Barriers to discharge        Co-evaluation               AM-PAC PT "6 Clicks" Daily Activity  Outcome Measure Difficulty turning over in bed (including adjusting bedclothes, sheets and blankets)?: None Difficulty moving from lying on back to sitting on the side of the bed? : None Difficulty sitting down on and standing up from a chair with arms (e.g., wheelchair, bedside commode, etc,.)?: A Little Help needed moving to and from a bed to chair (including a wheelchair)?: A Little Help needed walking in hospital room?: A Little Help needed climbing 3-5 steps with a railing? : A Little 6 Click Score: 20    End of Session Equipment Utilized During Treatment: Gait belt Activity Tolerance: Patient tolerated treatment well;Patient limited by fatigue Patient left: in bed;with nursing/sitter in room   PT Visit Diagnosis: Muscle weakness (generalized) (M62.81);Difficulty in walking, not elsewhere classified (R26.2)    Time: 1125-1200 PT Time Calculation (min) (ACUTE ONLY): 35 min   Charges:   PT Evaluation $PT Eval Low Complexity: 1 Low PT Treatments $Gait Training: 8-22 mins $Therapeutic Activity: 8-22 mins   PT G Codes:   PT G-Codes **NOT FOR INPATIENT CLASS** Functional Assessment Tool Used: AM-PAC 6 Clicks Basic Mobility;Clinical judgement Functional Limitation: Mobility: Walking and moving around Mobility: Walking and Moving Around Current Status (G9201): At least 20 percent but less than 40 percent impaired, limited or restricted Mobility: Walking and Moving Around Goal Status [phone]): At least 1 percent but less than 20 percent  impaired, limited or restricted  Alanson Puls, PT, DPT  Arelia Sneddon S 05/03/2017, 12:59 PM

## 2017-05-03 NOTE — Clinical Social Work Note (Signed)
Clinical Social Work Assessment  Patient Details  Name: [name] MRN: [id number] Date of Birth: 09/24/1927  Date of referral:  05/03/17               Reason for consult:  Facility Placement                Permission sought to share information with:  Facility Sport and exercise psychologist Permission granted to share information::  Yes, Verbal Permission Granted  Name::        Agency::  [facility]  Relationship::     Contact Information:  [phone]  Housing/Transportation Living arrangements for the past 2 months:  [place] of Information:  Patient, Medical Team, Facility Patient Interpreter Needed:  None Criminal Activity/Legal Involvement Pertinent to Current Situation/Hospitalization:  No - Comment as needed Significant Relationships:  Adult Children, Siblings, Community Support Lives with:  Facility Resident Do you feel safe going back to the place where you live?  Yes Need for family participation in patient care:  No (Coment)  Care giving concerns:  Patient admitted from [facility]   Social Worker assessment / plan:  CSW met with the patient's son at bedside to discuss discharge planning. Kasandra Knudsen confirmed that the patient is a resident of the ALF and plans on returning when stable. The patient is not in a memory care unit. Kasandra Knudsen also reported that the patient will need transportation at discharge.  According to [facility], the patient can return as early as tomorrow. The patient is not able to return today due to staffing shortages [place] [facility]. The plan is for the patient to discharge tomorrow. CSW will facilitate the discharge when the patient is cleared.  Employment status:  Retired Nurse, adult PT Recommendations:  Home with [place] / Referral to community resources:     Patient/Family's Response to care:  The patient's family thanked the  CSW Patient/Family's Understanding of and Emotional Response to Diagnosis, Current Treatment, and Prognosis: The patient and her family understand the discharge plan and are in agreement.  Emotional Assessment Appearance:  Appears stated age Attitude/Demeanor/Rapport:   (Pleasant) Affect (typically observed):  Appropriate, Pleasant Orientation:  Oriented to Self, Oriented to Place, Oriented to  Time, Oriented to Situation Alcohol / Substance use:  Never Used Psych involvement (Current and /or in the community):  No (Comment)  Discharge Needs  Concerns to be addressed:  Care Coordination, Discharge Planning Concerns Readmission within the last 30 days:  No Current discharge risk:  None Barriers to Discharge:  Continued Medical Work up   [employer], LCSW 05/03/2017, 2:28 PM

## 2017-05-03 NOTE — Progress Notes (Signed)
Hypoglycemic Event  CBG: 61 [id number]  Treatment: 25 ml of D50 IV and D5 at 6ml/hr  Symptoms: asymptomatic  **Patient taken to [place] by transporter.  Follow-up CBG: Time: 0845 CBG Result: 13, 17  Treatment: 25 ml of D50 IV  Follow-up CBG: Time: 0903   CBG Result: 46, 49  (A different meter was used to r/o meter error).  Comments/MD notified: [name] at bedside and made aware of above. STAT lab verification ordered and resulted in 88.   D5 at 10 ml/hr infusing. Patient remained asymptomatic throughout hypoglycemic event. Will continue to monitor patient closely.   [name]

## 2017-05-03 NOTE — Consult Note (Signed)
Reason for Consult: stroke Referring Physician: Dr. Darvin Neighbours  CC:  R side weakness   HPI: [name] is an 83 y.o. female female with a known history of chronic kidney disease stage III, bronchial asthma, CVA with right-sided weakness ,chronic pain, diabetes mellitus presented to the emergency room with tingling in the right foot and right lower extremity for 2 days. On MRI pt has L parietal small lacunar infarct.     Past Medical History:  Diagnosis Date  . Abnormal glucose   . Anxiety   . Arthritis   . Asthma   . Calculus of gallbladder without mention of cholecystitis, with obstruction   . Cancer ([place])    kidney  . Candidiasis of the esophagus   . Cataract cortical, senile   . Chronic kidney disease, stage III (moderate)   . Chronic pain   . Chronic pain syndrome   . Corns and callosities   . Debility   . Depression   . Dermatophytosis of nail   . Diabetes mellitus without complication ([place])    unspecified, without mention of complication  . Difficult intubation   . Dysphagia   . Dyspnea    with exertion  . Edema, unspecified   . Emphysema lung ([place])   . Emphysema of lung ([place])   . Esophageal reflux   . Essential hypertension, benign   . Generalized atherosclerosis   . Glaucoma (increased eye pressure)   . Gout, unspecified   . Hemiplegia ([place])   . Hereditary and idiopathic peripheral neuropathy   . Hyperglycemia   . Hyperlipidemia   . Hypertension   . Hypoglycemia, unspecified   . Hypopotassemia   . Hypothyroidism   . Migraine, unspecified, without mention of intractable migraine without mention of status migrainosus   . Other vitamin B12 deficiency anemia   . Pain in limb   . Peripheral vascular disease ([place])   . Personal history of fall   . Stroke ([place])   . Thyroid disease   . Ulcer of foot ([place])   . Unspecified asthma(493.90)   . Unspecified constipation   . Unspecified hypertensive heart disease with heart failure(402.91)   . Unspecified urinary  incontinence   . Unspecified vitamin D deficiency     Past Surgical History:  Procedure Laterality Date  . ABDOMINAL HYSTERECTOMY    . arterial thrombi stents in both legs    . BACK SURGERY    . BACK SURGERY    . CATARACT EXTRACTION    . COLONOSCOPY    . ESOPHAGOGASTRODUODENOSCOPY (EGD) WITH PROPOFOL N/A 06/19/2016   Procedure: ESOPHAGOGASTRODUODENOSCOPY (EGD) WITH PROPOFOL;  Surgeon: Lollie Sails, MD;  Location: [facility] ENDOSCOPY;  Service: Endoscopy;  Laterality: N/A;  . ESOPHAGOGASTRODUODENOSCOPY (EGD) WITH PROPOFOL N/A 04/10/2017   Procedure: ESOPHAGOGASTRODUODENOSCOPY (EGD) WITH PROPOFOL;  Surgeon: Lollie Sails, MD;  Location: [facility] ENDOSCOPY;  Service: Endoscopy;  Laterality: N/A;  . EYE SURGERY     cataract extraction  . LOWER EXTREMITY ANGIOGRAPHY Right 10/07/2016   Procedure: Lower Extremity Angiography;  Surgeon: Katha Cabal, MD;  Location: [facility] CV LAB;  Service: Cardiovascular;  Laterality: Right;  . PERIPHERAL VASCULAR CATHETERIZATION Left 09/02/2016   Procedure: Lower Extremity Angiography;  Surgeon: Katha Cabal, MD;  Location: [facility] CV LAB;  Service: Cardiovascular;  Laterality: Left;  . PERIPHERAL VASCULAR CATHETERIZATION Right 09/16/2016   Procedure: Lower Extremity Angiography;  Surgeon: Katha Cabal, MD;  Location: [facility] LAB;  Service: Cardiovascular;  Laterality: Right;    Family  History  Problem Relation Age of Onset  . Cancer Father   . Stroke Father   . Seizures Mother   . Lung cancer Sister   . Bone cancer Sister     Social History:  reports that she has never smoked. She has never used smokeless tobacco. She reports that she does not drink alcohol or use drugs.  No Known Allergies  Medications: I have reviewed the patient's current medications.  ROS: History obtained from the patient  General ROS: negative for - chills, fatigue, fever, night sweats, weight gain or weight loss Psychological ROS:  negative for - behavioral disorder, hallucinations, memory difficulties, mood swings or suicidal ideation Ophthalmic ROS: negative for - blurry vision, double vision, eye pain or loss of vision ENT ROS: negative for - epistaxis, nasal discharge, oral lesions, sore throat, tinnitus or vertigo Allergy and Immunology ROS: negative for - hives or itchy/watery eyes Hematological and Lymphatic ROS: negative for - bleeding problems, bruising or swollen lymph nodes Endocrine ROS: negative for - galactorrhea, hair pattern changes, polydipsia/polyuria or temperature intolerance Respiratory ROS: negative for - cough, hemoptysis, shortness of breath or wheezing Cardiovascular ROS: negative for - chest pain, dyspnea on exertion, edema or irregular heartbeat Gastrointestinal ROS: negative for - abdominal pain, diarrhea, hematemesis, nausea/vomiting or stool incontinence Genito-Urinary ROS: negative for - dysuria, hematuria, incontinence or urinary frequency/urgency Musculoskeletal ROS: negative for - joint swelling or muscular weakness Neurological ROS: as noted in HPI Dermatological ROS: negative for rash and skin lesion changes  Physical Examination: Blood pressure (!) 157/65, pulse 71, temperature 98.2 F (36.8 C), temperature source Oral, resp. rate 18, height 5\' 5"  (1.651 m), weight 81.1 kg (178 lb 12.8 oz), SpO2 96 %.  Neurological Examination   Mental Status: Alert, oriented, thought content appropriate.  Speech fluent without evidence of aphasia.  Able to follow 3 step commands without difficulty. Cranial Nerves: II: Discs flat bilaterally; Visual fields grossly normal, pupils equal, round, reactive to light and accommodation III,IV, VI: ptosis not present, extra-ocular motions intact bilaterally V,VII: smile symmetric, facial light touch sensation normal bilaterally VIII: hearing normal bilaterally IX,X: gag reflex present XI: bilateral shoulder shrug XII: midline tongue  extension Motor: Right : Upper extremity   4/5    Left:     Upper extremity   5/5  Lower extremity   3/5     Lower extremity   5/5 Tone and bulk:normal tone throughout; no atrophy noted Sensory: Pinprick and light touch intact throughout, bilaterally Deep Tendon Reflexes: 2+ and symmetric throughout Plantars: Right: downgoing   Left: downgoing Cerebellar: normal finger-to-nose, normal rapid alternating movements and normal heel-to-shin test Gait: not tested      Laboratory Studies:   Basic Metabolic Panel:  Recent Labs Lab 05/02/17 1916 05/03/17 0922  NA 139  --   K 4.4  --   CL 107  --   CO2 25  --   GLUCOSE 75 88  BUN 15  --   CREATININE 1.25*  --   CALCIUM 9.0  --     Liver Function Tests:  Recent Labs Lab 05/02/17 1916  AST 23  ALT 11*  ALKPHOS 133*  BILITOT 0.6  PROT 6.9  ALBUMIN 3.2*   No results for input(s): LIPASE, AMYLASE in the last 168 hours. No results for input(s): AMMONIA in the last 168 hours.  CBC:  Recent Labs Lab 05/02/17 1916  WBC 7.7  NEUTROABS 4.6  HGB 11.5*  HCT 34.6*  MCV 95.4  PLT 267  Cardiac Enzymes:  Recent Labs Lab 05/02/17 1916  TROPONINI <0.03    BNP: Invalid input(s): POCBNP  CBG:  Recent Labs Lab 05/03/17 0848 05/03/17 0903 05/03/17 0914 05/03/17 1154 05/03/17 1159  GLUCAP 17* 46* 49* <10* 63    Microbiology: Results for orders placed or performed during the hospital encounter of 05/02/17  MRSA PCR Screening     Status: None   Collection Time: 05/03/17  4:04 AM  Result Value Ref Range Status   MRSA by PCR NEGATIVE NEGATIVE Final    Comment:        The GeneXpert MRSA Assay (FDA approved for NASAL specimens only), is one component of a comprehensive MRSA colonization surveillance program. It is not intended to diagnose MRSA infection nor to guide or monitor treatment for MRSA infections.     Coagulation Studies:  Recent Labs  05/02/17 1916  LABPROT 13.9  INR 1.08     Urinalysis:  Recent Labs Lab 05/02/17 1945  COLORURINE STRAW*  LABSPEC 1.005  PHURINE 7.0  GLUCOSEU NEGATIVE  HGBUR NEGATIVE  BILIRUBINUR NEGATIVE  KETONESUR NEGATIVE  PROTEINUR NEGATIVE  NITRITE NEGATIVE  LEUKOCYTESUR NEGATIVE    Lipid Panel:     Component Value Date/Time   CHOL 135 05/03/2017 0535   TRIG 87 05/03/2017 0535   HDL 41 05/03/2017 0535   CHOLHDL 3.3 05/03/2017 0535   VLDL 17 05/03/2017 0535   LDLCALC 77 05/03/2017 0535    HgbA1C:  Lab Results  Component Value Date   HGBA1C 5.4 07/21/2016    Urine Drug Screen:     Component Value Date/Time   LABOPIA NONE DETECTED 07/20/2016 1750   COCAINSCRNUR NONE DETECTED 07/20/2016 1750   LABBENZ NONE DETECTED 07/20/2016 1750   AMPHETMU NONE DETECTED 07/20/2016 1750   THCU NONE DETECTED 07/20/2016 1750   LABBARB NONE DETECTED 07/20/2016 1750    Alcohol Level: No results for input(s): ETH in the last 168 hours.  Other results: EKG: normal EKG, normal sinus rhythm, unchanged from previous tracings.  Imaging: Ct Head Wo Contrast  Result Date: 05/02/2017 CLINICAL DATA:  Right leg weakness and numbness that started yesterday morning. EXAM: CT HEAD WITHOUT CONTRAST TECHNIQUE: Contiguous axial images were obtained from the base of the skull through the vertex without intravenous contrast. COMPARISON:  Brain MRI 07/21/2016 FINDINGS: Brain: No evidence of acute infarction, hemorrhage, hydrocephalus, extra-axial collection or mass lesion/mass effect. Multiple clustered small to moderate bilateral remote cerebellar infarcts. Remote left pons infarct. Remote white matter infarct at the anterior limb left internal capsule, also seen 07/20/2016 by head CT. Chronic microvascular ischemic changes in the cerebral white matter. Brain atrophy, underestimated due to hyperostosis. Vascular: Atherosclerotic calcification. No hyperdense vessels suspected. Skull: No acute or aggressive finding.  Hyperostosis. Sinuses/Orbits: No acute  finding.  Bilateral cataract resection. IMPRESSION: 1. No acute finding when compared to prior. 2. Remote bilateral cerebellar, left pontine, and left internal capsule infarcts. Electronically Signed   By: Monte Fantasia M.D.   On: 05/02/2017 18:55   Mr Brain Wo Contrast  Result Date: 05/03/2017 CLINICAL DATA:  RIGHT lower extremity tingling. Assess for stroke. History of peripheral neuropathy, hypertension, cancer, hyperlipidemia. EXAM: MRI HEAD WITHOUT CONTRAST TECHNIQUE: Multiplanar, multiecho pulse sequences of the brain and surrounding structures were obtained without intravenous contrast. COMPARISON:  CT HEAD May 02, 2017 at 1838 hours and MRI of the head July 21, 2016 FINDINGS: BRAIN: 8 mm LEFT parietal cortex reduced diffusion with low ADC value. No susceptibility artifact to suggest hemorrhage. Old large bilateral cerebellar  infarcts. Patchy to confluent supratentorial white matter FLAIR T2 hyperintensities. Old LEFT pontine and ventral medullary infarcts. Old LEFT basal ganglia to internal capsule infarct. Faint T2 hyperintensities bilateral basal ganglia and thalamus associated with chronic small vessel ischemic disease. Ventricles and sulci are normal for patient's age. No midline shift, mass effect or masses. No abnormal extra-axial fluid collections. VASCULAR: Major intracranial vascular flow voids present at skull base. Dolichoectatic intracranial vessels seen with chronic hypertension. SKULL AND UPPER CERVICAL SPINE: No abnormal sellar expansion. No suspicious calvarial bone marrow signal. Craniocervical junction maintained. SINUSES/ORBITS: LEFT maxillary sinus mucosal thickening without air-fluid levels. Mastoid air cells are well aerated. The included ocular globes and orbital contents are non-suspicious. Status post bilateral ocular lens implants. OTHER: Patient is edentulous. IMPRESSION: 1. Acute subcentimeter LEFT parietal infarct. 2. Multiple old lacunar infarcts, old cerebellar  infarcts. 3. Moderate chronic small vessel ischemic disease. Electronically Signed   By: Elon Alas M.D.   On: 05/03/2017 00:59   US Carotid Bilateral (at [facility] And [facility] Only)  Result Date: 05/03/2017 CLINICAL DATA:  83 year old female with a history of cerebrovascular accident. Cardiovascular risk factors include hypertension, known prior stroke/ TIA, hyperlipidemia, diabetes, known vascular disease EXAM: BILATERAL CAROTID DUPLEX ULTRASOUND TECHNIQUE: Pearline Cables scale imaging, color Doppler and duplex ultrasound were performed of bilateral carotid and vertebral arteries in the neck. COMPARISON:  No prior duplex FINDINGS: Criteria: Quantification of carotid stenosis is based on velocity parameters that correlate the residual internal carotid diameter with NASCET-based stenosis levels, using the diameter of the distal internal carotid lumen as the denominator for stenosis measurement. The following velocity measurements were obtained: RIGHT ICA:  Systolic 94 cm/sec, Diastolic 13 cm/sec CCA:  84 cm/sec SYSTOLIC ICA/CCA RATIO:  1.1 ECA:  80 cm/sec LEFT ICA:  Systolic 709 cm/sec, Diastolic 35 cm/sec CCA:  628 cm/sec SYSTOLIC ICA/CCA RATIO:  1.6 ECA:  22 cm/sec Right Brachial SBP: Not acquired Left Brachial SBP: Not acquired RIGHT CAROTID ARTERY: Mild calcifications of the right common carotid artery. Intermediate waveform maintained. Heterogeneous and partially calcified plaque at the right carotid bifurcation. No significant lumen shadowing. Low resistance waveform of the right ICA. Mild tortuosity RIGHT VERTEBRAL ARTERY: Antegrade flow with low resistance waveform. LEFT CAROTID ARTERY: No significant calcifications of the left common carotid artery. Intermediate waveform maintained. Heterogeneous and partially calcified plaque at the left carotid bifurcation without significant lumen shadowing. Low resistance waveform of the left ICA. Mild tortuosity LEFT VERTEBRAL ARTERY:  Antegrade flow with low resistance  waveform. IMPRESSION: Right: Color duplex indicates moderate heterogeneous and calcified plaque, with no hemodynamically significant stenosis by duplex criteria in the extracranial cerebrovascular circulation. Left: Moderate heterogeneous and partially calcified plaque at the carotid bifurcation, with discordant results regarding degree of stenosis by established duplex criteria. Peak velocity suggests 50%- 69% stenosis, with the ICA/ CCA ratio suggesting a lesser degree of stenosis. If establishing a more accurate degree of stenosis is required, cerebral angiogram should be considered, or as a second best test, CTA. Signed, Dulcy Fanny. Earleen Newport, DO [facility] Radiology Electronically Signed   By: Corrie Mckusick D.O.   On: 05/03/2017 10:48     Assessment/Plan:  83 y.o. female female with a known history of chronic kidney disease stage III, bronchial asthma, CVA with right-sided weakness ,chronic pain, diabetes mellitus presented to the emergency room with tingling in the right foot and right lower extremity for 2 days. On MRI pt has L parietal small lacunar infarct.    - Upper extremity  has improved but RLE is still weak - pt/ot - no critical stenosis on [place] - ASA 325 started - might need rehab for the RLE Leotis Pain    05/03/2017, 1:22 PM

## 2017-05-03 NOTE — Progress Notes (Signed)
Obtained noon CBG using finger - resulted in less than 10. Glucose resulted in 76 when using ear lobe. MD made aware.   PER MD:   **Please use patients ear lobe to obtain glucose levels moving forward. Thank you.**  Madlyn Frankel, RN

## 2017-05-03 NOTE — Progress Notes (Signed)
MD placed patient on appropriate diet as patient does not report a history of dysphagia. Madlyn Frankel, RN

## 2017-05-03 NOTE — ED Notes (Signed)
Pt transport to 103

## 2017-05-03 NOTE — ED Notes (Signed)
Butch RN, aware of bed assigned  

## 2017-05-04 ENCOUNTER — Encounter (INDEPENDENT_AMBULATORY_CARE_PROVIDER_SITE_OTHER): Payer: Medicare Other

## 2017-05-04 ENCOUNTER — Ambulatory Visit (INDEPENDENT_AMBULATORY_CARE_PROVIDER_SITE_OTHER): Payer: Medicare Other | Admitting: Vascular Surgery

## 2017-05-04 DIAGNOSIS — G43909 Migraine, unspecified, not intractable, without status migrainosus: Secondary | ICD-10-CM | POA: Diagnosis present

## 2017-05-04 DIAGNOSIS — Z823 Family history of stroke: Secondary | ICD-10-CM | POA: Diagnosis not present

## 2017-05-04 DIAGNOSIS — I130 Hypertensive heart and chronic kidney disease with heart failure and stage 1 through stage 4 chronic kidney disease, or unspecified chronic kidney disease: Secondary | ICD-10-CM | POA: Diagnosis present

## 2017-05-04 DIAGNOSIS — E1151 Type 2 diabetes mellitus with diabetic peripheral angiopathy without gangrene: Secondary | ICD-10-CM | POA: Diagnosis present

## 2017-05-04 DIAGNOSIS — H409 Unspecified glaucoma: Secondary | ICD-10-CM | POA: Diagnosis present

## 2017-05-04 DIAGNOSIS — Z90710 Acquired absence of both cervix and uterus: Secondary | ICD-10-CM | POA: Diagnosis not present

## 2017-05-04 DIAGNOSIS — E039 Hypothyroidism, unspecified: Secondary | ICD-10-CM | POA: Diagnosis present

## 2017-05-04 DIAGNOSIS — I639 Cerebral infarction, unspecified: Secondary | ICD-10-CM | POA: Diagnosis present

## 2017-05-04 DIAGNOSIS — M109 Gout, unspecified: Secondary | ICD-10-CM | POA: Diagnosis present

## 2017-05-04 DIAGNOSIS — Z7982 Long term (current) use of aspirin: Secondary | ICD-10-CM | POA: Diagnosis not present

## 2017-05-04 DIAGNOSIS — Z808 Family history of malignant neoplasm of other organs or systems: Secondary | ICD-10-CM | POA: Diagnosis not present

## 2017-05-04 DIAGNOSIS — Z9849 Cataract extraction status, unspecified eye: Secondary | ICD-10-CM | POA: Diagnosis not present

## 2017-05-04 DIAGNOSIS — E559 Vitamin D deficiency, unspecified: Secondary | ICD-10-CM | POA: Diagnosis present

## 2017-05-04 DIAGNOSIS — Z8673 Personal history of transient ischemic attack (TIA), and cerebral infarction without residual deficits: Secondary | ICD-10-CM | POA: Diagnosis not present

## 2017-05-04 DIAGNOSIS — R202 Paresthesia of skin: Secondary | ICD-10-CM | POA: Diagnosis present

## 2017-05-04 DIAGNOSIS — J439 Emphysema, unspecified: Secondary | ICD-10-CM | POA: Diagnosis present

## 2017-05-04 DIAGNOSIS — Z801 Family history of malignant neoplasm of trachea, bronchus and lung: Secondary | ICD-10-CM | POA: Diagnosis not present

## 2017-05-04 DIAGNOSIS — E785 Hyperlipidemia, unspecified: Secondary | ICD-10-CM | POA: Insufficient documentation

## 2017-05-04 DIAGNOSIS — G609 Hereditary and idiopathic neuropathy, unspecified: Secondary | ICD-10-CM | POA: Diagnosis present

## 2017-05-04 DIAGNOSIS — N183 Chronic kidney disease, stage 3 (moderate): Secondary | ICD-10-CM | POA: Diagnosis present

## 2017-05-04 DIAGNOSIS — Z7902 Long term (current) use of antithrombotics/antiplatelets: Secondary | ICD-10-CM | POA: Diagnosis not present

## 2017-05-04 DIAGNOSIS — M1990 Unspecified osteoarthritis, unspecified site: Secondary | ICD-10-CM | POA: Diagnosis present

## 2017-05-04 DIAGNOSIS — D519 Vitamin B12 deficiency anemia, unspecified: Secondary | ICD-10-CM | POA: Diagnosis present

## 2017-05-04 DIAGNOSIS — Z85528 Personal history of other malignant neoplasm of kidney: Secondary | ICD-10-CM | POA: Diagnosis not present

## 2017-05-04 DIAGNOSIS — E1122 Type 2 diabetes mellitus with diabetic chronic kidney disease: Secondary | ICD-10-CM | POA: Diagnosis present

## 2017-05-04 LAB — GLUCOSE, CAPILLARY
GLUCOSE-CAPILLARY: 86 mg/dL (ref 65–99)
GLUCOSE-CAPILLARY: 83 mg/dL (ref 65–99)
Glucose-Capillary: 13 mg/dL — CL (ref 65–99)
Glucose-Capillary: <10 mg/dL — CL (ref 65–99)
Glucose-Capillary: 106 mg/dL — ABNORMAL HIGH (ref 65–99)

## 2017-05-04 LAB — HEMOGLOBIN A1C
HEMOGLOBIN A1C: 5.4 % (ref 4.8–5.6)
MEAN PLASMA GLUCOSE: 108.28 mg/dL

## 2017-05-04 LAB — ECHOCARDIOGRAM COMPLETE
Height: 65 in
WEIGHTICAEL: 2860.8 [oz_av]

## 2017-05-04 MED ORDER — INFLUENZA VAC SPLIT HIGH-DOSE 0.5 ML IM SUSY: 0.5000 mL | PREFILLED_SYRINGE | Freq: Once | INTRAMUSCULAR | Status: DC

## 2017-05-04 NOTE — Progress Notes (Signed)
OT Cancellation Note  Patient Details Name: Euretha Najarro MRN: [id number] DOB: [birth year]   Cancelled Treatment:    Reason Eval/Treat Not Completed: Other (comment). Order received, chart reviewed. Upon initial attempt, pt with SLP for assessment. Will re-attempt OT evaluation at later time as pt is available.  Jeni Salles, MPH, MS, OTR/L ascom [phone] 05/04/17, 9:33 AM

## 2017-05-04 NOTE — Progress Notes (Deleted)
MRN : [id number]  [name] is a 83 y.o. (08/19/1927) female who presents with chief complaint of No chief complaint on file. Marland Kitchen  History of Present Illness: ***  Current Facility-Administered Medications for the 05/04/17 encounter (Appointment) with Delana Meyer, Dolores Lory, MD  Medication  . ceFAZolin (ANCEF) IVPB 1 g/50 mL premix   No outpatient prescriptions have been marked as taking for the 05/04/17 encounter (Appointment) with Delana Meyer, Dolores Lory, MD.    Past Medical History:  Diagnosis Date  . Abnormal glucose   . Anxiety   . Arthritis   . Asthma   . Calculus of gallbladder without mention of cholecystitis, with obstruction   . Cancer ([place])    kidney  . Candidiasis of the esophagus   . Cataract cortical, senile   . Chronic kidney disease, stage III (moderate)   . Chronic pain   . Chronic pain syndrome   . Corns and callosities   . Debility   . Depression   . Dermatophytosis of nail   . Diabetes mellitus without complication ([place])    unspecified, without mention of complication  . Difficult intubation   . Dysphagia   . Dyspnea    with exertion  . Edema, unspecified   . Emphysema lung ([place])   . Emphysema of lung ([place])   . Esophageal reflux   . Essential hypertension, benign   . Generalized atherosclerosis   . Glaucoma (increased eye pressure)   . Gout, unspecified   . Hemiplegia ([place])   . Hereditary and idiopathic peripheral neuropathy   . Hyperglycemia   . Hyperlipidemia   . Hypertension   . Hypoglycemia, unspecified   . Hypopotassemia   . Hypothyroidism   . Migraine, unspecified, without mention of intractable migraine without mention of status migrainosus   . Other vitamin B12 deficiency anemia   . Pain in limb   . Peripheral vascular disease ([place])   . Personal history of fall   . Stroke ([place])   . Thyroid disease   . Ulcer of foot ([place])   . Unspecified asthma(493.90)   . Unspecified constipation   . Unspecified hypertensive heart disease with  heart failure(402.91)   . Unspecified urinary incontinence   . Unspecified vitamin D deficiency     Past Surgical History:  Procedure Laterality Date  . ABDOMINAL HYSTERECTOMY    . arterial thrombi stents in both legs    . BACK SURGERY    . BACK SURGERY    . CATARACT EXTRACTION    . COLONOSCOPY    . ESOPHAGOGASTRODUODENOSCOPY (EGD) WITH PROPOFOL N/A 06/19/2016   Procedure: ESOPHAGOGASTRODUODENOSCOPY (EGD) WITH PROPOFOL;  Surgeon: Lollie Sails, MD;  Location: [facility] ENDOSCOPY;  Service: Endoscopy;  Laterality: N/A;  . ESOPHAGOGASTRODUODENOSCOPY (EGD) WITH PROPOFOL N/A 04/10/2017   Procedure: ESOPHAGOGASTRODUODENOSCOPY (EGD) WITH PROPOFOL;  Surgeon: Lollie Sails, MD;  Location: [facility] ENDOSCOPY;  Service: Endoscopy;  Laterality: N/A;  . EYE SURGERY     cataract extraction  . LOWER EXTREMITY ANGIOGRAPHY Right 10/07/2016   Procedure: Lower Extremity Angiography;  Surgeon: Katha Cabal, MD;  Location: [place] [facility];  Service: Cardiovascular;  Laterality: Right;  . PERIPHERAL VASCULAR CATHETERIZATION Left 09/02/2016   Procedure: Lower Extremity Angiography;  Surgeon: Katha Cabal, MD;  Location: [place] [facility];  Service: Cardiovascular;  Laterality: Left;  . PERIPHERAL VASCULAR CATHETERIZATION Right 09/16/2016   Procedure: Lower Extremity Angiography;  Surgeon: Katha Cabal, MD;  Location: [place] [facility];  Service: Cardiovascular;  Laterality: Right;  Social History Social History  Substance Use Topics  . Smoking status: Never Smoker  . Smokeless tobacco: Never Used  . Alcohol use No    Family History Family History  Problem Relation Age of Onset  . Cancer Father   . Stroke Father   . Seizures Mother   . Lung cancer Sister   . Bone cancer Sister     No Known Allergies   REVIEW OF SYSTEMS (Negative unless checked)  Constitutional: [] Weight loss  [] Fever  [] Chills Cardiac: [] Chest pain   [] Chest pressure   [] Palpitations   [] Shortness  of breath when laying flat   [] Shortness of breath with exertion. Vascular:  [] Pain in legs with walking   [] Pain in legs at rest  [] History of DVT   [] Phlebitis   [] Swelling in legs   [] Varicose veins   [] Non-healing ulcers Pulmonary:   [] Uses home oxygen   [] Productive cough   [] Hemoptysis   [] Wheeze  [] COPD   [] Asthma Neurologic:  [] Dizziness   [] Seizures   [] History of stroke   [] History of TIA  [] Aphasia   [] Vissual changes   [] Weakness or numbness in arm   [] Weakness or numbness in leg Musculoskeletal:   [] Joint swelling   [] Joint pain   [] Low back pain Hematologic:  [] Easy bruising  [] Easy bleeding   [] Hypercoagulable state   [] Anemic Gastrointestinal:  [] Diarrhea   [] Vomiting  [] Gastroesophageal reflux/heartburn   [] Difficulty swallowing. Genitourinary:  [] Chronic kidney disease   [] Difficult urination  [] Frequent urination   [] Blood in urine Skin:  [] Rashes   [] Ulcers  Psychological:  [] History of anxiety   []  History of major depression.  Physical Examination  There were no vitals filed for this visit. There is no height or weight on file to calculate BMI. Gen: WD/WN, NAD Head: [place]/AT, No temporalis wasting.  Ear/Nose/Throat: Hearing grossly intact, nares w/o erythema or drainage Eyes: PER, EOMI, sclera nonicteric.  Neck: Supple, no large masses.   Pulmonary:  Good air movement, no audible wheezing bilaterally, no use of accessory muscles.  Cardiac: RRR, no JVD Vascular: *** Vessel Right Left  Radial Palpable Palpable  Ulnar Palpable Palpable  Brachial Palpable Palpable  Carotid Palpable Palpable  Femoral Palpable Palpable  Popliteal Palpable Palpable  PT Palpable Palpable  DP Palpable Palpable  Gastrointestinal: Non-distended. No guarding/no peritoneal signs.  Musculoskeletal: M/S 5/5 throughout.  No deformity or atrophy.  Neurologic: CN 2-12 intact. Symmetrical.  Speech is fluent. Motor exam as listed above. Psychiatric: Judgment intact, Mood & affect appropriate for  pt's clinical situation. Dermatologic: No rashes or ulcers noted.  No changes consistent with cellulitis. Lymph : No lichenification or skin changes of chronic lymphedema.  CBC Lab Results  Component Value Date   WBC 7.7 05/02/2017   HGB 11.5 (L) 05/02/2017   HCT 34.6 (L) 05/02/2017   MCV 95.4 05/02/2017   PLT 267 05/02/2017    BMET    Component Value Date/Time   NA 139 05/02/2017 1916   NA 139 09/22/2014 0521   K 4.4 05/02/2017 1916   K 4.4 09/22/2014 0521   CL 107 05/02/2017 1916   CL 109 (H) 09/22/2014 0521   CO2 25 05/02/2017 1916   CO2 24 09/22/2014 0521   GLUCOSE 88 05/03/2017 0922   GLUCOSE 83 09/22/2014 0521   BUN 15 05/02/2017 1916   BUN 9 09/22/2014 0521   CREATININE 1.25 (H) 05/02/2017 1916   CREATININE 0.97 09/22/2014 0521   CALCIUM 9.0 05/02/2017 1916   CALCIUM 9.0 09/22/2014 0521  GFRNONAA 38 (L) 05/02/2017 1916   GFRNONAA 58 (L) 09/22/2014 0521   GFRNONAA 43 (L) 02/27/2013 1139   GFRAA 44 (L) 05/02/2017 1916   GFRAA >60 09/22/2014 0521   GFRAA 50 (L) 02/27/2013 1139   Estimated Creatinine Clearance: 33.3 mL/min (A) (by C-G formula based on SCr of 1.25 mg/dL (H)).  COAG Lab Results  Component Value Date   INR 1.08 05/02/2017   INR 1.12 07/20/2016   INR 1.04 06/19/2016    Radiology Ct Head Wo Contrast  Result Date: 05/02/2017 CLINICAL DATA:  Right leg weakness and numbness that started yesterday morning. EXAM: CT HEAD WITHOUT CONTRAST TECHNIQUE: Contiguous axial images were obtained from the base of the skull through the vertex without intravenous contrast. COMPARISON:  Brain MRI 07/21/2016 FINDINGS: Brain: No evidence of acute infarction, hemorrhage, hydrocephalus, extra-axial collection or mass lesion/mass effect. Multiple clustered small to moderate bilateral remote cerebellar infarcts. Remote left pons infarct. Remote white matter infarct at the anterior limb left internal capsule, also seen 07/20/2016 by head CT. Chronic microvascular ischemic  changes in the cerebral white matter. Brain atrophy, underestimated due to hyperostosis. Vascular: Atherosclerotic calcification. No hyperdense vessels suspected. Skull: No acute or aggressive finding.  Hyperostosis. Sinuses/Orbits: No acute finding.  Bilateral cataract resection. IMPRESSION: 1. No acute finding when compared to prior. 2. Remote bilateral cerebellar, left pontine, and left internal capsule infarcts. Electronically Signed   By: Monte Fantasia M.D.   On: 05/02/2017 18:55   Mr Brain Wo Contrast  Result Date: 05/03/2017 CLINICAL DATA:  RIGHT lower extremity tingling. Assess for stroke. History of peripheral neuropathy, hypertension, cancer, hyperlipidemia. EXAM: MRI HEAD WITHOUT CONTRAST TECHNIQUE: Multiplanar, multiecho pulse sequences of the brain and surrounding structures were obtained without intravenous contrast. COMPARISON:  CT HEAD May 02, 2017 at 1838 hours and MRI of the head July 21, 2016 FINDINGS: BRAIN: 8 mm LEFT parietal cortex reduced diffusion with low ADC value. No susceptibility artifact to suggest hemorrhage. Old large bilateral cerebellar infarcts. Patchy to confluent supratentorial white matter FLAIR T2 hyperintensities. Old LEFT pontine and ventral medullary infarcts. Old LEFT basal ganglia to internal capsule infarct. Faint T2 hyperintensities bilateral basal ganglia and thalamus associated with chronic small vessel ischemic disease. Ventricles and sulci are normal for patient's age. No midline shift, mass effect or masses. No abnormal extra-axial fluid collections. VASCULAR: Major intracranial vascular flow voids present at skull base. Dolichoectatic intracranial vessels seen with chronic hypertension. SKULL AND UPPER CERVICAL SPINE: No abnormal sellar expansion. No suspicious calvarial bone marrow signal. Craniocervical junction maintained. SINUSES/ORBITS: LEFT maxillary sinus mucosal thickening without air-fluid levels. Mastoid air cells are well aerated. The  included ocular globes and orbital contents are non-suspicious. Status post bilateral ocular lens implants. OTHER: Patient is edentulous. IMPRESSION: 1. Acute subcentimeter LEFT parietal infarct. 2. Multiple old lacunar infarcts, old cerebellar infarcts. 3. Moderate chronic small vessel ischemic disease. Electronically Signed   By: Elon Alas M.D.   On: 05/03/2017 00:59   US Carotid Bilateral (at [facility] And [facility] Only)  Result Date: 05/03/2017 CLINICAL DATA:  83 year old female with a history of cerebrovascular accident. Cardiovascular risk factors include hypertension, known prior stroke/ TIA, hyperlipidemia, diabetes, known vascular disease EXAM: BILATERAL CAROTID DUPLEX ULTRASOUND TECHNIQUE: Pearline Cables scale imaging, color Doppler and duplex ultrasound were performed of bilateral carotid and vertebral arteries in the neck. COMPARISON:  No prior duplex FINDINGS: Criteria: Quantification of carotid stenosis is based on velocity parameters that correlate the residual internal carotid diameter with NASCET-based stenosis levels, using the diameter of  the distal internal carotid lumen as the denominator for stenosis measurement. The following velocity measurements were obtained: RIGHT ICA:  Systolic 94 cm/sec, Diastolic 13 cm/sec CCA:  84 cm/sec SYSTOLIC ICA/CCA RATIO:  1.1 ECA:  80 cm/sec LEFT ICA:  Systolic 518 cm/sec, Diastolic 35 cm/sec CCA:  841 cm/sec SYSTOLIC ICA/CCA RATIO:  1.6 ECA:  22 cm/sec Right Brachial SBP: Not acquired Left Brachial SBP: Not acquired RIGHT CAROTID ARTERY: Mild calcifications of the right common carotid artery. Intermediate waveform maintained. Heterogeneous and partially calcified plaque at the right carotid bifurcation. No significant lumen shadowing. Low resistance waveform of the right ICA. Mild tortuosity RIGHT VERTEBRAL ARTERY: Antegrade flow with low resistance waveform. LEFT CAROTID ARTERY: No significant calcifications of the left common carotid artery. Intermediate waveform  maintained. Heterogeneous and partially calcified plaque at the left carotid bifurcation without significant lumen shadowing. Low resistance waveform of the left ICA. Mild tortuosity LEFT VERTEBRAL ARTERY:  Antegrade flow with low resistance waveform. IMPRESSION: Right: Color duplex indicates moderate heterogeneous and calcified plaque, with no hemodynamically significant stenosis by duplex criteria in the extracranial cerebrovascular circulation. Left: Moderate heterogeneous and partially calcified plaque at the carotid bifurcation, with discordant results regarding degree of stenosis by established duplex criteria. Peak velocity suggests 50%- 69% stenosis, with the ICA/ CCA ratio suggesting a lesser degree of stenosis. If establishing a more accurate degree of stenosis is required, cerebral angiogram should be considered, or as a second best test, CTA. Signed, Dulcy Fanny. Earleen Newport, DO [facility] Electronically Signed   By: Corrie Mckusick D.O.   On: 05/03/2017 10:48    Outside Studies/Documentation *** pages of outside documents were reviewed.  They showed ***.  Assessment/Plan 1. PVD (peripheral vascular disease) (HCC) ***  2. Essential hypertension ***  3. Mixed hyperlipidemia ***  4. Hypothyroidism, unspecified type ***  5. Idiopathic chronic gout of multiple sites without tophus ***    Hortencia Pilar, MD  05/04/2017 8:21 AM

## 2017-05-04 NOTE — Plan of Care (Signed)
Problem: Safety: Goal: Ability to remain free from injury will improve Outcome: Progressing High fall risk with yellow arm band and yellow socks on. Bed alarm is on.

## 2017-05-04 NOTE — Clinical Social Work Note (Addendum)
CSW spoke to [facility], and they have to come assess patient before they can determine if patient can return back to ALF today.  CSW informed ALF, that patient is ready for discharge today.  CSW awaiting for assessment by [facility].  CSW spoke to [place] at [facility], and informed her that patient will be returning back to ALF today via EMS.  CSW  contacted patient's son Kasandra Knudsen [phone] and informed him that patient will be discharging back to ALF today.   Jones Broom. Kleberg, MSW, [place]  05/04/2017 12:27 PM

## 2017-05-04 NOTE — Discharge Summary (Signed)
[facility] NAME: [name]    MR#:  [id number]  DATE OF BIRTH:  Sep 02, 1927  DATE OF ADMISSION:  05/02/2017 ADMITTING PHYSICIAN: Saundra Shelling, MD  DATE OF DISCHARGE: No discharge date for patient encounter.  PRIMARY CARE PHYSICIAN: Rosaland Lao, NP   ADMISSION DIAGNOSIS:  Numbness and tingling of right leg [R20.0, R20.2] Cerebrovascular accident (CVA), unspecified mechanism ([place]) [I63.9]  DISCHARGE DIAGNOSIS:  Active Problems:   CVA (cerebral vascular accident) ([place])   SECONDARY DIAGNOSIS:   Past Medical History:  Diagnosis Date  . Abnormal glucose   . Anxiety   . Arthritis   . Asthma   . Calculus of gallbladder without mention of cholecystitis, with obstruction   . Cancer ([place])    kidney  . Candidiasis of the esophagus   . Cataract cortical, senile   . Chronic kidney disease, stage III (moderate)   . Chronic pain   . Chronic pain syndrome   . Corns and callosities   . Debility   . Depression   . Dermatophytosis of nail   . Diabetes mellitus without complication ([place])    unspecified, without mention of complication  . Difficult intubation   . Dysphagia   . Dyspnea    with exertion  . Edema, unspecified   . Emphysema lung ([place])   . Emphysema of lung ([place])   . Esophageal reflux   . Essential hypertension, benign   . Generalized atherosclerosis   . Glaucoma (increased eye pressure)   . Gout, unspecified   . Hemiplegia ([place])   . Hereditary and idiopathic peripheral neuropathy   . Hyperglycemia   . Hyperlipidemia   . Hypertension   . Hypoglycemia, unspecified   . Hypopotassemia   . Hypothyroidism   . Migraine, unspecified, without mention of intractable migraine without mention of status migrainosus   . Other vitamin B12 deficiency anemia   . Pain in limb   . Peripheral vascular disease ([place])   . Personal history of fall   . Stroke ([place])   . Thyroid disease   . Ulcer of foot ([place])   .  Unspecified asthma(493.90)   . Unspecified constipation   . Unspecified hypertensive heart disease with heart failure(402.91)   . Unspecified urinary incontinence   . Unspecified vitamin D deficiency      ADMITTING HISTORY  HISTORY OF PRESENT ILLNESS: [name]  is a 83 y.o. female with a known history of chronic kidney disease stage III, bronchial asthma, CVA with right-sided weakness ,chronic pain, diabetes mellitus presented to the emergency room with tingling in the right foot and right lower extremity. This has been going on since yesterday. No complaints of any slurred speech, difficulty swallowing food. Patient has some weakness in the right leg from the last stroke. She was evaluated in the emergency room with CT head and MRI brain. MRI brain showed a new left parietal infarct along with old infarcts.hospitalist service was consulted for further care of the patient. No complaints of any chest pain, shortness of breath. No headache dizziness and blurry vision.  HOSPITAL COURSE:   * Acute left parietal lacunar infarct ASA, statin Echo normal Carotids - no significant stenosis.  * Erroneous hypoglycemia reading on accuchecks Lab  * HTN Stable  Return to [facility] with PT  Dysphagia 1 diet with nectar thickened liquids  CONSULTS OBTAINED:  Treatment Team:  Leotis Pain, MD  DRUG ALLERGIES:  No Known Allergies  DISCHARGE MEDICATIONS:  Current Discharge Medication List    CONTINUE these medications which have NOT CHANGED   Details  acetaminophen (TYLENOL) 325 MG tablet Take 650 mg by mouth every 4 (four) hours as needed for mild pain (Dont exceed 300 mg in 24 hours from all sources).    allopurinol (ZYLOPRIM) 100 MG tablet Take 100 mg by mouth daily.    aspirin EC 81 MG EC tablet Take 1 tablet (81 mg total) by mouth daily. Qty: 30 tablet, Refills: 0    atorvastatin (LIPITOR) 40 MG tablet Take 1 tablet (40 mg total) by mouth daily at 6 PM. Qty: 30  tablet, Refills: 1    Cholecalciferol (VITAMIN D-3) 1000 UNITS CAPS Take 2,000 Units by mouth daily.     clopidogrel (PLAVIX) 75 MG tablet Take 75 mg by mouth daily with breakfast.    DULoxetine (CYMBALTA) 60 MG capsule Take 60 mg by mouth daily.    furosemide (LASIX) 20 MG tablet Take 1 tablet (20 mg total) by mouth every other day. Qty: 30 tablet, Refills: 0    gabapentin (NEURONTIN) 100 MG capsule Take 100 mg by mouth 3 (three) times daily. 100mg  in am, 300mg  bid at 2pm and 9pm    guaiFENesin (MUCINEX) 600 MG 12 hr tablet Take by mouth 2 (two) times daily.    latanoprost (XALATAN) 0.005 % ophthalmic solution Place 1 drop into both eyes at bedtime.    levothyroxine (SYNTHROID, LEVOTHROID) 25 MCG tablet Take 25 mcg by mouth daily before breakfast.    metoprolol succinate (TOPROL-XL) 25 MG 24 hr tablet Take 25 mg by mouth daily.    montelukast (SINGULAIR) 10 MG tablet Take 10 mg by mouth at bedtime.     oxycodone (OXY-IR) 5 MG capsule Take 5 mg by mouth every 6 (six) hours as needed for pain.    pantoprazole (PROTONIX) 40 MG tablet Take 40 mg by mouth daily at 6 (six) AM.     potassium chloride (MICRO-K) 10 MEQ CR capsule Take 10 mEq by mouth 2 (two) times daily.    Probiotic Product (ALIGN) 4 MG CAPS Take 4 mg by mouth daily.    senna (SENOKOT) 8.6 MG tablet Take 1 tablet by mouth 3 (three) times daily as needed for constipation.    topiramate (TOPAMAX) 25 MG tablet Take 25 mg by mouth at bedtime.    traMADol (ULTRAM) 50 MG tablet Take 50 mg by mouth every 8 (eight) hours.     ursodiol (ACTIGALL) 300 MG capsule Take 300 mg by mouth 2 (two) times daily.    vitamin B-12 (CYANOCOBALAMIN) 1000 MCG tablet Take 1,000 mcg by mouth daily.    amitriptyline (ELAVIL) 25 MG tablet Take 25 mg by mouth at bedtime.    dexlansoprazole (DEXILANT) 60 MG capsule Take 60 mg by mouth daily.    HYDROcodone-acetaminophen (NORCO/VICODIN) 5-325 MG tablet Take 1 tablet by mouth every 6 (six)  hours as needed for moderate pain. Qty: 30 tablet, Refills: 0        Today   VITAL SIGNS:  Blood pressure (!) 148/54, pulse 77, temperature 97.8 F (36.6 C), temperature source Oral, resp. rate 15, height 5\' 5"  (1.651 m), weight 81.1 kg (178 lb 12.8 oz), SpO2 97 %.  I/O:   Intake/Output Summary (Last 24 hours) at 05/04/17 1033 Last data filed at 05/04/17 1015  Gross per 24 hour  Intake              880 ml  Output  1500 ml  Net             -620 ml    PHYSICAL EXAMINATION:  Physical Exam  GENERAL:  83 y.o.-year-old patient lying in the bed with no acute distress.  LUNGS: Normal breath sounds bilaterally, no wheezing, rales,rhonchi or crepitation. No use of accessory muscles of respiration.  CARDIOVASCULAR: S1, S2 normal. No murmurs, rubs, or gallops.  ABDOMEN: Soft, non-tender, non-distended. Bowel sounds present. No organomegaly or mass.  NEUROLOGIC: RUE 4/5. RLE 3/5. Left normal PSYCHIATRIC: The patient is alert and oriented x 3.  SKIN: No obvious rash, lesion, or ulcer.   DATA REVIEW:   CBC  Recent Labs Lab 05/02/17 1916  WBC 7.7  HGB 11.5*  HCT 34.6*  PLT 267    Chemistries   Recent Labs Lab 05/02/17 1916 05/03/17 0922  NA 139  --   K 4.4  --   CL 107  --   CO2 25  --   GLUCOSE 75 88  BUN 15  --   CREATININE 1.25*  --   CALCIUM 9.0  --   AST 23  --   ALT 11*  --   ALKPHOS 133*  --   BILITOT 0.6  --     Cardiac Enzymes  Recent Labs Lab 05/02/17 1916  TROPONINI <0.03    Microbiology Results  Results for orders placed or performed during the hospital encounter of 05/02/17  MRSA PCR Screening     Status: None   Collection Time: 05/03/17  4:04 AM  Result Value Ref Range Status   MRSA by PCR NEGATIVE NEGATIVE Final    Comment:        The GeneXpert MRSA Assay (FDA approved for NASAL specimens only), is one component of a comprehensive MRSA colonization surveillance program. It is not intended to diagnose MRSA infection nor  to guide or monitor treatment for MRSA infections.     RADIOLOGY:  Ct Head Wo Contrast  Result Date: 05/02/2017 CLINICAL DATA:  Right leg weakness and numbness that started yesterday morning. EXAM: CT HEAD WITHOUT CONTRAST TECHNIQUE: Contiguous axial images were obtained from the base of the skull through the vertex without intravenous contrast. COMPARISON:  Brain MRI 07/21/2016 FINDINGS: Brain: No evidence of acute infarction, hemorrhage, hydrocephalus, extra-axial collection or mass lesion/mass effect. Multiple clustered small to moderate bilateral remote cerebellar infarcts. Remote left pons infarct. Remote white matter infarct at the anterior limb left internal capsule, also seen 07/20/2016 by head CT. Chronic microvascular ischemic changes in the cerebral white matter. Brain atrophy, underestimated due to hyperostosis. Vascular: Atherosclerotic calcification. No hyperdense vessels suspected. Skull: No acute or aggressive finding.  Hyperostosis. Sinuses/Orbits: No acute finding.  Bilateral cataract resection. IMPRESSION: 1. No acute finding when compared to prior. 2. Remote bilateral cerebellar, left pontine, and left internal capsule infarcts. Electronically Signed   By: Monte Fantasia M.D.   On: 05/02/2017 18:55   Mr Brain Wo Contrast  Result Date: 05/03/2017 CLINICAL DATA:  RIGHT lower extremity tingling. Assess for stroke. History of peripheral neuropathy, hypertension, cancer, hyperlipidemia. EXAM: MRI HEAD WITHOUT CONTRAST TECHNIQUE: Multiplanar, multiecho pulse sequences of the brain and surrounding structures were obtained without intravenous contrast. COMPARISON:  CT HEAD May 02, 2017 at 1838 hours and MRI of the head July 21, 2016 FINDINGS: BRAIN: 8 mm LEFT parietal cortex reduced diffusion with low ADC value. No susceptibility artifact to suggest hemorrhage. Old large bilateral cerebellar infarcts. Patchy to confluent supratentorial white matter FLAIR T2 hyperintensities. Old  LEFT pontine  and ventral medullary infarcts. Old LEFT basal ganglia to internal capsule infarct. Faint T2 hyperintensities bilateral basal ganglia and thalamus associated with chronic small vessel ischemic disease. Ventricles and sulci are normal for patient's age. No midline shift, mass effect or masses. No abnormal extra-axial fluid collections. VASCULAR: Major intracranial vascular flow voids present at skull base. Dolichoectatic intracranial vessels seen with chronic hypertension. SKULL AND UPPER CERVICAL SPINE: No abnormal sellar expansion. No suspicious calvarial bone marrow signal. Craniocervical junction maintained. SINUSES/ORBITS: LEFT maxillary sinus mucosal thickening without air-fluid levels. Mastoid air cells are well aerated. The included ocular globes and orbital contents are non-suspicious. Status post bilateral ocular lens implants. OTHER: Patient is edentulous. IMPRESSION: 1. Acute subcentimeter LEFT parietal infarct. 2. Multiple old lacunar infarcts, old cerebellar infarcts. 3. Moderate chronic small vessel ischemic disease. Electronically Signed   By: Elon Alas M.D.   On: 05/03/2017 00:59   US Carotid Bilateral (at [facility] Only)  Result Date: 05/03/2017 CLINICAL DATA:  83 year old female with a history of cerebrovascular accident. Cardiovascular risk factors include hypertension, known prior stroke/ TIA, hyperlipidemia, diabetes, known vascular disease EXAM: BILATERAL CAROTID DUPLEX ULTRASOUND TECHNIQUE: Pearline Cables scale imaging, color Doppler and duplex ultrasound were performed of bilateral carotid and vertebral arteries in the neck. COMPARISON:  No prior duplex FINDINGS: Criteria: Quantification of carotid stenosis is based on velocity parameters that correlate the residual internal carotid diameter with NASCET-based stenosis levels, using the diameter of the distal internal carotid lumen as the denominator for stenosis measurement. The following velocity measurements were obtained:  RIGHT ICA:  Systolic 94 cm/sec, Diastolic 13 cm/sec CCA:  84 cm/sec SYSTOLIC ICA/CCA RATIO:  1.1 ECA:  80 cm/sec LEFT ICA:  Systolic 419 cm/sec, Diastolic 35 cm/sec CCA:  622 cm/sec SYSTOLIC ICA/CCA RATIO:  1.6 ECA:  22 cm/sec Right Brachial SBP: Not acquired Left Brachial SBP: Not acquired RIGHT CAROTID ARTERY: Mild calcifications of the right common carotid artery. Intermediate waveform maintained. Heterogeneous and partially calcified plaque at the right carotid bifurcation. No significant lumen shadowing. Low resistance waveform of the right ICA. Mild tortuosity RIGHT VERTEBRAL ARTERY: Antegrade flow with low resistance waveform. LEFT CAROTID ARTERY: No significant calcifications of the left common carotid artery. Intermediate waveform maintained. Heterogeneous and partially calcified plaque at the left carotid bifurcation without significant lumen shadowing. Low resistance waveform of the left ICA. Mild tortuosity LEFT VERTEBRAL ARTERY:  Antegrade flow with low resistance waveform. IMPRESSION: Right: Color duplex indicates moderate heterogeneous and calcified plaque, with no hemodynamically significant stenosis by duplex criteria in the extracranial cerebrovascular circulation. Left: Moderate heterogeneous and partially calcified plaque at the carotid bifurcation, with discordant results regarding degree of stenosis by established duplex criteria. Peak velocity suggests 50%- 69% stenosis, with the ICA/ CCA ratio suggesting a lesser degree of stenosis. If establishing a more accurate degree of stenosis is required, cerebral angiogram should be considered, or as a second best test, CTA. Signed, Dulcy Fanny. Earleen Newport, DO [facility] Radiology Electronically Signed   By: Corrie Mckusick D.O.   On: 05/03/2017 10:48    Follow up with PCP in 1 week.  Management plans discussed with the patient, family and they are in agreement.  CODE STATUS:     Code Status Orders         Start     Ordered   05/03/17 0350  Full code  Continuous     05/03/17 0349    Code Status History    Date Active Date Inactive Code Status  Order ID Comments User Context   10/07/2016 12:03 PM 10/07/2016  5:38 PM Full Code [id number]  Katha Cabal, MD Inpatient   09/16/2016 10:27 AM 09/16/2016  5:11 PM Full Code [id number]  Delana Meyer, Dolores Lory, MD Inpatient   09/02/2016 10:51 AM 09/02/2016  4:01 PM Full Code [id number]  Delana Meyer Dolores Lory, MD Inpatient   07/22/2016 10:02 AM 07/22/2016 11:18 PM DNR [id number]  Fritzi Mandes, MD Inpatient   07/20/2016  2:55 PM 07/22/2016 10:02 AM Full Code [id number]  Hillary Bow, MD ED      TOTAL TIME TAKING CARE OF THIS PATIENT ON DAY OF DISCHARGE: more than 30 minutes.   Hillary Bow R M.D on 05/04/2017 at 10:33 AM  Between 7am to 6pm - Pager - [phone]  After 6pm go to [email or web address] - password [email or web address] [facility]  Office  [phone]  CC: Primary care physician; Rosaland Lao, NP  Note: This dictation was prepared with Dragon dictation along with smaller phrase technology. Any transcriptional errors that result from this process are unintentional.

## 2017-05-04 NOTE — Evaluation (Signed)
Clinical/Bedside Swallow Evaluation Patient Details  Name: [name] MRN: [id number] Date of Birth: May 20, 1928  Today's Date: 05/04/2017 Time: SLP Start Time (ACUTE ONLY): 0840 SLP Stop Time (ACUTE ONLY): 0940 SLP Time Calculation (min) (ACUTE ONLY): 60 min  Past Medical History:  Past Medical History:  Diagnosis Date  . Abnormal glucose   . Anxiety   . Arthritis   . Asthma   . Calculus of gallbladder without mention of cholecystitis, with obstruction   . Cancer ([place])    kidney  . Candidiasis of the esophagus   . Cataract cortical, senile   . Chronic kidney disease, stage III (moderate)   . Chronic pain   . Chronic pain syndrome   . Corns and callosities   . Debility   . Depression   . Dermatophytosis of nail   . Diabetes mellitus without complication ([place])    unspecified, without mention of complication  . Difficult intubation   . Dysphagia   . Dyspnea    with exertion  . Edema, unspecified   . Emphysema lung ([place])   . Emphysema of lung ([place])   . Esophageal reflux   . Essential hypertension, benign   . Generalized atherosclerosis   . Glaucoma (increased eye pressure)   . Gout, unspecified   . Hemiplegia ([place])   . Hereditary and idiopathic peripheral neuropathy   . Hyperglycemia   . Hyperlipidemia   . Hypertension   . Hypoglycemia, unspecified   . Hypopotassemia   . Hypothyroidism   . Migraine, unspecified, without mention of intractable migraine without mention of status migrainosus   . Other vitamin B12 deficiency anemia   . Pain in limb   . Peripheral vascular disease ([place])   . Personal history of fall   . Stroke ([place])   . Thyroid disease   . Ulcer of foot ([place])   . Unspecified asthma(493.90)   . Unspecified constipation   . Unspecified hypertensive heart disease with heart failure(402.91)   . Unspecified urinary incontinence   . Unspecified vitamin D deficiency    Past Surgical History:  Past Surgical History:  Procedure Laterality Date  .  ABDOMINAL HYSTERECTOMY    . arterial thrombi stents in both legs    . BACK SURGERY    . BACK SURGERY    . CATARACT EXTRACTION    . COLONOSCOPY    . ESOPHAGOGASTRODUODENOSCOPY (EGD) WITH PROPOFOL N/A 06/19/2016   Procedure: ESOPHAGOGASTRODUODENOSCOPY (EGD) WITH PROPOFOL;  Surgeon: Lollie Sails, MD;  Location[facility] ENDOSCOPY;  Service: Endoscopy;  Laterality: N/A;  . ESOPHAGOGASTRODUODENOSCOPY (EGD) WITH PROPOFOL N/A 04/10/2017   Procedure: ESOPHAGOGASTRODUODENOSCOPY (EGD) WITH PROPOFOL;  Surgeon: Lollie Sails, MD;  Location: [facility] ENDOSCOPY;  Service: Endoscopy;  Laterality: N/A;  . EYE SURGERY     cataract extraction  . LOWER EXTREMITY ANGIOGRAPHY Right 10/07/2016   Procedure: Lower Extremity Angiography;  Surgeon: Katha Cabal, MD;  Location: [place] CV LAB;  Service: Cardiovascular;  Laterality: Right;  . PERIPHERAL VASCULAR CATHETERIZATION Left 09/02/2016   Procedure: Lower Extremity Angiography;  Surgeon: Katha Cabal, MD;  Location: [place] CV LAB;  Service: Cardiovascular;  Laterality: Left;  . PERIPHERAL VASCULAR CATHETERIZATION Right 09/16/2016   Procedure: Lower Extremity Angiography;  Surgeon: Katha Cabal, MD;  Location: [place] CV LAB;  Service: Cardiovascular;  Laterality: Right;   HPI:  Pt is an 83 y.o. female female with a known history of multiple medial issues including Esophageal dysphagia (has had esophageal dilatation in the past; EGD dated 06/19/2016.  At that time, she had a erosive esophagitis as well as distal narrowing), on PPI for esophagitis, chronic kidney disease stage III, bronchial asthma, CVA with right-sided weakness ,chronic pain, diabetes mellitus presented to the emergency room with tingling in the right foot and right lower extremity for 2 days. On MRI pt has L parietal small lacunar infarct. Of note, pt has a significant Esophageal dysmotility issues currently which are impacting her diet consistency. Per EGD on  [id number], GI noted narrowing at the distal Esophagus and gastritis and placed pt on a PPI and modifed diet d/t the narrowing; requested Repeat upper endoscopy in 3 weeks (see EGD notes in chart). Of course any Esophageal dysmotility issues can impact the oropharyngeal phases of swallowing and increase risk for aspiration of reflux material. Pt does have decreased desire for a pureed diet; has been taking pills crushed in puree w/ NSG.    Assessment / Plan / Recommendation Clinical Impression  Pt appears to present w/ adequate oropharyngeal phase swallow function when following general aspiration precautions w/ a modified diet using Nectar consistency liquids. Pt consumed several ozs of Nectar liquids via cup w/ no overt s/s of aspiration noted; similar noted w/ Puree trials - though, pt does endorse of "full" feeling in her throat intermittently and uses a f/u swallow to clear. Suspect this could be related to the Esopahgeal dysmotility/dysphagia ongoing. When pt consumed trials of thin liquids, she exhibited overt coughing w/ trials inconsistently - unsure if related to the GI issues or to the new parietal CVA. As pt stated she felt "better" when drinking the Nectar liquids, and d/t increased risk for aspiration w/ potential pulmonary decline, a modifed diet is recommended at this time until GI procedure can be completed (repeat EGD). Recommend f/u by ST services post GI procedure for assessment for any diet upgrade. Recommend aspiration precautions; Pills crushed in puree ongoing as per GI order d/t Esopahgeal narrowing.  SLP Visit Diagnosis: Dysphagia, pharyngoesophageal phase (R13.14);Dysphagia, unspecified (R13.10) (Esophageal dysmotility)    Aspiration Risk  Mild aspiration risk    Diet Recommendation  Dysphagia level 1(puree) d/t GI/Esophageal issues as per GI; Nectar consistency liquids; aspiration and Reflux precautions  Medication Administration: Crushed with puree (as per GI)    Other   Recommendations Recommended Consults: Consider GI evaluation;Consider esophageal assessment ((ongoing);  Dietitian f/u for drink supplementing) Oral Care Recommendations: Oral care BID;Patient independent with oral care;Staff/trained caregiver to provide oral care (remove dentures daily) Other Recommendations: Order thickener from pharmacy;Prohibited food (jello, ice cream, thin soups);Remove water pitcher;Have oral suction available   Follow up Recommendations Skilled Nursing facility      Frequency and Duration  (TBD)   (TBD)       Prognosis Prognosis for Safe Diet Advancement: Fair (-Good) Barriers to Reach Goals: Severity of deficits (GI deficits)      Swallow Study   General Date of Onset: 05/02/17 HPI: Pt is an 83 y.o. female female with a known history of multiple medial issues including Esophageal dysphagia (has had esophageal dilatation in the past; EGD dated 06/19/2016. At that time, she had a erosive esophagitis as well as distal narrowing), on PPI for esophagitis, chronic kidney disease stage III, bronchial asthma, CVA with right-sided weakness ,chronic pain, diabetes mellitus presented to the emergency room with tingling in the right foot and right lower extremity for 2 days. On MRI pt has L parietal small lacunar infarct. Of note, pt has a significant Esophageal dysmotility issues currently which are impacting her diet consistency.  Per EGD on 8.24.20128, GI noted narrowing at the distal Esophagus and gastritis and placed pt on a PPI and modifed diet d/t the narrowing; requested Repeat upper endoscopy in 3 weeks (see EGD notes in chart). Of course any Esophageal dysmotility issues can impact the oropharyngeal phases of swallowing and increase risk for aspiration of reflux material. Pt does have decreased desire for a pureed diet; has been taking pills crushed in puree w/ NSG.  Type of Study: Bedside Swallow Evaluation Previous Swallow Assessment: EGD Diet Prior to this Study:  Dysphagia 1 (puree);Thin liquids (d/t GI issues) Temperature Spikes Noted: No (wbc 7.7) Respiratory Status: Room air History of Recent Intubation: No Behavior/Cognition: Alert;Cooperative;Pleasant mood Oral Cavity Assessment:  (min sticky) Oral Care Completed by SLP: Recent completion by staff Oral Cavity - Dentition: Dentures, top;Dentures, bottom Vision: Functional for self-feeding Self-Feeding Abilities: Able to feed self;Needs assist;Needs set up Patient Positioning: Upright in bed Baseline Vocal Quality: Normal;Low vocal intensity (min) Volitional Cough: Strong Volitional Swallow: Able to elicit    Oral/Motor/Sensory Function Overall Oral Motor/Sensory Function: Within functional limits   Ice Chips Ice chips: Impaired Presentation: Spoon (fed; 3) Oral Phase Impairments:  (none) Oral Phase Functional Implications:  (none) Pharyngeal Phase Impairments: Throat Clearing - Delayed (x1)   Thin Liquid Thin Liquid: Impaired Presentation: Cup;Self Fed;Straw (x2 trials each) Oral Phase Impairments:  (none) Oral Phase Functional Implications:  (none) Pharyngeal  Phase Impairments: Cough - Immediate;Cough - Delayed (immediate coughing w/ straw; delayed coughing x1/2 w/ cup)    Nectar Thick Nectar Thick Liquid: Within functional limits Presentation: Cup;Self Fed (~3-4 ozs)   Honey Thick Honey Thick Liquid: Not tested   Puree Puree: Within functional limits Presentation: Self Fed;Spoon (fed; 6 trials )   Solid   GO   Solid: Not tested Other Comments: d/t GI issues    Functional Assessment Tool Used: clinical judgement Functional Limitations: Swallowing Swallow Current Status (J8841): At least 20 percent but less than 40 percent impaired, limited or restricted Swallow Goal Status [id number]): At least 20 percent but less than 40 percent impaired, limited or restricted Swallow Discharge Status [phone]): At least 20 percent but less than 40 percent impaired, limited or restricted     Orinda Kenner, MS, CCC-SLP Neomia Herbel 05/04/2017,12:48 PM

## 2017-05-04 NOTE — Evaluation (Signed)
Occupational Therapy Evaluation Patient Details Name: [name] MRN: [id number] DOB: [birth year] Today's Date: 05/04/2017    History of Present Illness [name]  is a 83 y.o. female with a known history of chronic kidney disease stage III, bronchial asthma, CVA with right-sided weakness ,chronic pain, diabetes mellitus presented to the emergency room with tingling in the right foot and right lower extremity. This has been going on since yesterday. No complaints of any slurred speech, difficulty swallowing food. Patient has some weakness in the right leg from the last stroke. She was evaluated in the emergency room with CT head and MRI brain. MRI brain showed a new left parietal infarct along with old infarcts.hospitalist service was consulted for further care of the patient. No complaints of any chest pain, shortness of breath. No headache dizziness and blurry vision.   Clinical Impression   Pt seen for OT evaluation this date. Prior to hospital admission, pt was modified independent with functional mobility and required assist from staff for medication mgt, meals, and seated bathing at ALF. Currently pt is supervision to min guard for functional mobility, and requires minimal assist for LB ADL. Pt would benefit from therapeutic exercises for RUE, education/training in AE for LB dressing, and education in falls prevention and energy conservation strategies .  Pt would benefit from skilled OT to address noted impairments and functional limitations (see below for any additional details).  Upon hospital discharge, recommend pt discharge back to ALF with [facility].    Follow Up Recommendations  Home health OT    Equipment Recommendations  None recommended by OT    Recommendations for Other Services       Precautions / Restrictions Precautions Precautions: Fall Restrictions Weight Bearing Restrictions: No      Mobility Bed Mobility Overal bed mobility: Needs Assistance Bed Mobility:  Supine to Sit;Sit to Supine     Supine to sit: Supervision;HOB elevated Sit to supine: Supervision   General bed mobility comments: use of bed rails for sup>sit  Transfers Overall transfer level: Needs assistance Equipment used: Rolling walker (2 wheeled) Transfers: Sit to/from Stand Sit to Stand: Min guard         General transfer comment: close min guard for transfer with verbal cue for hand placement    Balance Overall balance assessment: Needs assistance Sitting-balance support: Bilateral upper extremity supported;Feet supported Sitting balance-Leahy Scale: Good     Standing balance support: Bilateral upper extremity supported Standing balance-Leahy Scale: Fair                             ADL either performed or assessed with clinical judgement   ADL Overall ADL's : Needs assistance/impaired Eating/Feeding: Sitting;Set up   Grooming: Sitting;Set up   Upper Body Bathing: Sitting;Set up;Minimal assistance Upper Body Bathing Details (indicate cue type and reason): min assist to wash back ( this is at baseline) Lower Body Bathing: Moderate assistance;Maximal assistance;Sitting/lateral leans;Sit to/from stand   Upper Body Dressing : Sitting;Set up;Supervision/safety   Lower Body Dressing: Sitting/lateral leans;Minimal assistance;Moderate assistance Lower Body Dressing Details (indicate cue type and reason): with set up pt able to slide house slippers on feet with good grip/tread; pt unable to reach feet at this time, requires min-mod assist for LB dressing of underwear and pants Toilet Transfer: Ambulation;Comfort height toilet;RW;Min guard   Toileting- Clothing Manipulation and Hygiene: Modified independent;Sitting/lateral lean       Functional mobility during ADLs: Rolling walker;Min guard  Vision Baseline Vision/History: Wears glasses Wears Glasses: At all times (glasses at ALF) Patient Visual Report: No change from baseline;Diplopia (Pt  reports she has double vision sometimes at baseline, sometimes gets better when she closes 1 eye. Does not report double vision during time of evaluation.) Vision Assessment?: No apparent visual deficits     Perception     Praxis      Pertinent Vitals/Pain Pain Assessment: No/denies pain     Hand Dominance Right   Extremity/Trunk Assessment Upper Extremity Assessment Upper Extremity Assessment: Overall WFL for tasks assessed (RUE grossly 4/5, LUE grossly 4+/5)   Lower Extremity Assessment Lower Extremity Assessment: RLE deficits/detail (hx peripheral neuropathy in both feet) RLE Deficits / Details: RLE 3-/5 hip, 3/5 knee RLE Sensation: history of peripheral neuropathy   Cervical / Trunk Assessment Cervical / Trunk Assessment: Normal   Communication Communication Communication: No difficulties   Cognition Arousal/Alertness: Awake/alert Behavior During Therapy: WFL for tasks assessed/performed Overall Cognitive Status: Within Functional Limits for tasks assessed                                     General Comments       Exercises     Shoulder Instructions      Home Living Family/patient expects to be discharged to:: Assisted living                             Home Equipment: Walker - 4 wheels;Cane - single point;Walker - 2 wheels   Additional Comments: pt reports she has a 2WW but does not use it.      Prior Functioning/Environment Level of Independence: Needs assistance  Gait / Transfers Assistance Needed: mod indep with household distances and amb to/from dining hall (for all meals) using 7WG ADL's / Homemaking Assistance Needed: pt reports staff manage medications, provide mod assist for seated showers (wash back, BLE)   Comments: no falls in 12 mo, enjoys Bingo at facility.        OT Problem List: Decreased strength;Decreased activity tolerance;Decreased knowledge of use of DME or AE      OT Treatment/Interventions:  Self-care/ADL training;Therapeutic exercise;Therapeutic activities;Energy conservation;DME and/or AE instruction;Patient/family education    OT Goals(Current goals can be found in the care plan section) Acute Rehab OT Goals Patient Stated Goal: get better and return to ALF OT Goal Formulation: With patient Time For Goal Achievement: 05/11/17 Potential to Achieve Goals: Good  OT Frequency: Min 1X/week   Barriers to D/C:            Co-evaluation              AM-PAC PT "6 Clicks" Daily Activity     Outcome Measure Help from another person eating meals?: None Help from another person taking care of personal grooming?: None Help from another person toileting, which includes using toliet, bedpan, or urinal?: A Little Help from another person bathing (including washing, rinsing, drying)?: A Lot Help from another person to put on and taking off regular upper body clothing?: A Little Help from another person to put on and taking off regular lower body clothing?: A Lot 6 Click Score: 18   End of Session Equipment Utilized During Treatment: Gait belt;Rolling walker  Activity Tolerance: Patient tolerated treatment well Patient left: in bed;with call bell/phone within reach;with bed alarm set  OT Visit Diagnosis: Other abnormalities of gait  and mobility (R26.89);Muscle weakness (generalized) (M62.81)                Time: 1031-1050 OT Time Calculation (min): 19 min Charges:  OT General Charges $OT Visit: 1 Visit OT Evaluation $OT Eval Low Complexity: 1 Low G-Codes: OT G-codes **NOT FOR INPATIENT CLASS** Functional Assessment Tool Used: AM-PAC 6 Clicks Daily Activity;Clinical judgement Functional Limitation: Self care Self Care Current Status (U5488): At least 20 percent but less than 40 percent impaired, limited or restricted Self Care Goal Status (N0141): At least 1 percent but less than 20 percent impaired, limited or restricted   Jeni Salles, MPH, MS, OTR/L ascom  [phone] 05/04/17, 11:31 AM

## 2017-05-04 NOTE — Progress Notes (Addendum)
[facility] NAME: [name]    MR#:  [id number]  DATE OF BIRTH:  07/22/1928  SUBJECTIVE:  CHIEF COMPLAINT:   Chief Complaint  Patient presents with  . Tingling    R leg, R foot   Still has weakness right leg  REVIEW OF SYSTEMS:    Review of Systems  Constitutional: Negative for chills and fever.  HENT: Negative for sore throat.   Eyes: Negative for blurred vision, double vision and pain.  Respiratory: Negative for cough, hemoptysis, shortness of breath and wheezing.   Cardiovascular: Negative for chest pain, palpitations, orthopnea and leg swelling.  Gastrointestinal: Negative for abdominal pain, constipation, diarrhea, heartburn, nausea and vomiting.  Genitourinary: Negative for dysuria and hematuria.  Musculoskeletal: Negative for back pain and joint pain.  Skin: Negative for rash.  Neurological: Positive for sensory change and focal weakness. Negative for speech change and headaches.  Endo/Heme/Allergies: Does not bruise/bleed easily.  Psychiatric/Behavioral: Negative for depression. The patient is not nervous/anxious.     DRUG ALLERGIES:  No Known Allergies  VITALS:  Blood pressure (!) 148/54, pulse 77, temperature 97.8 F (36.6 C), temperature source Oral, resp. rate 15, height 5\' 5"  (1.651 m), weight 81.1 kg (178 lb 12.8 oz), SpO2 97 %.  PHYSICAL EXAMINATION:   Physical Exam  GENERAL:  83 y.o.-year-old patient lying in the bed with no acute distress.  EYES: Pupils equal, round, reactive to light and accommodation. No scleral icterus. Extraocular muscles intact.  HEENT: Head atraumatic, normocephalic. Oropharynx and nasopharynx clear.  NECK:  Supple, no jugular venous distention. No thyroid enlargement, no tenderness.  LUNGS: Normal breath sounds bilaterally, no wheezing, rales, rhonchi. No use of accessory muscles of respiration.  CARDIOVASCULAR: S1, S2 normal. No murmurs, rubs, or gallops.  ABDOMEN: Soft,  nontender, nondistended. Bowel sounds present. No organomegaly or mass.  EXTREMITIES: No cyanosis, clubbing or edema b/l.    NEUROLOGIC: Cranial nerves II through XII are intact.  RUE 4/5    RLE 3/5 LUE 5/5     LLE 5/5 PSYCHIATRIC: The patient is alert and oriented x 3.  SKIN: No obvious rash, lesion, or ulcer.   LABORATORY PANEL:   CBC  Recent Labs Lab 05/02/17 1916  WBC 7.7  HGB 11.5*  HCT 34.6*  PLT 267   ------------------------------------------------------------------------------------------------------------------ Chemistries   Recent Labs Lab 05/02/17 1916 05/03/17 0922  NA 139  --   K 4.4  --   CL 107  --   CO2 25  --   GLUCOSE 75 88  BUN 15  --   CREATININE 1.25*  --   CALCIUM 9.0  --   AST 23  --   ALT 11*  --   ALKPHOS 133*  --   BILITOT 0.6  --    ------------------------------------------------------------------------------------------------------------------  Cardiac Enzymes  Recent Labs Lab 05/02/17 1916  TROPONINI <0.03   ------------------------------------------------------------------------------------------------------------------  RADIOLOGY:  Ct Head Wo Contrast  Result Date: 05/02/2017 CLINICAL DATA:  Right leg weakness and numbness that started yesterday morning. EXAM: CT HEAD WITHOUT CONTRAST TECHNIQUE: Contiguous axial images were obtained from the base of the skull through the vertex without intravenous contrast. COMPARISON:  Brain MRI 07/21/2016 FINDINGS: Brain: No evidence of acute infarction, hemorrhage, hydrocephalus, extra-axial collection or mass lesion/mass effect. Multiple clustered small to moderate bilateral remote cerebellar infarcts. Remote left pons infarct. Remote white matter infarct at the anterior limb left internal capsule, also seen 07/20/2016 by head CT. Chronic microvascular ischemic changes in  the cerebral white matter. Brain atrophy, underestimated due to hyperostosis. Vascular: Atherosclerotic calcification. No  hyperdense vessels suspected. Skull: No acute or aggressive finding.  Hyperostosis. Sinuses/Orbits: No acute finding.  Bilateral cataract resection. IMPRESSION: 1. No acute finding when compared to prior. 2. Remote bilateral cerebellar, left pontine, and left internal capsule infarcts. Electronically Signed   By: Monte Fantasia M.D.   On: 05/02/2017 18:55   Mr Brain Wo Contrast  Result Date: 05/03/2017 CLINICAL DATA:  RIGHT lower extremity tingling. Assess for stroke. History of peripheral neuropathy, hypertension, cancer, hyperlipidemia. EXAM: MRI HEAD WITHOUT CONTRAST TECHNIQUE: Multiplanar, multiecho pulse sequences of the brain and surrounding structures were obtained without intravenous contrast. COMPARISON:  CT HEAD May 02, 2017 at 1838 hours and MRI of the head July 21, 2016 FINDINGS: BRAIN: 8 mm LEFT parietal cortex reduced diffusion with low ADC value. No susceptibility artifact to suggest hemorrhage. Old large bilateral cerebellar infarcts. Patchy to confluent supratentorial white matter FLAIR T2 hyperintensities. Old LEFT pontine and ventral medullary infarcts. Old LEFT basal ganglia to internal capsule infarct. Faint T2 hyperintensities bilateral basal ganglia and thalamus associated with chronic small vessel ischemic disease. Ventricles and sulci are normal for patient's age. No midline shift, mass effect or masses. No abnormal extra-axial fluid collections. VASCULAR: Major intracranial vascular flow voids present at skull base. Dolichoectatic intracranial vessels seen with chronic hypertension. SKULL AND UPPER CERVICAL SPINE: No abnormal sellar expansion. No suspicious calvarial bone marrow signal. Craniocervical junction maintained. SINUSES/ORBITS: LEFT maxillary sinus mucosal thickening without air-fluid levels. Mastoid air cells are well aerated. The included ocular globes and orbital contents are non-suspicious. Status post bilateral ocular lens implants. OTHER: Patient is edentulous.  IMPRESSION: 1. Acute subcentimeter LEFT parietal infarct. 2. Multiple old lacunar infarcts, old cerebellar infarcts. 3. Moderate chronic small vessel ischemic disease. Electronically Signed   By: Elon Alas M.D.   On: 05/03/2017 00:59   US Carotid Bilateral (at [facility] Only)  Result Date: 05/03/2017 CLINICAL DATA:  83 year old female with a history of cerebrovascular accident. Cardiovascular risk factors include hypertension, known prior stroke/ TIA, hyperlipidemia, diabetes, known vascular disease EXAM: BILATERAL CAROTID DUPLEX ULTRASOUND TECHNIQUE: Pearline Cables scale imaging, color Doppler and duplex ultrasound were performed of bilateral carotid and vertebral arteries in the neck. COMPARISON:  No prior duplex FINDINGS: Criteria: Quantification of carotid stenosis is based on velocity parameters that correlate the residual internal carotid diameter with NASCET-based stenosis levels, using the diameter of the distal internal carotid lumen as the denominator for stenosis measurement. The following velocity measurements were obtained: RIGHT ICA:  Systolic 94 cm/sec, Diastolic 13 cm/sec CCA:  84 cm/sec SYSTOLIC ICA/CCA RATIO:  1.1 ECA:  80 cm/sec LEFT ICA:  Systolic 462 cm/sec, Diastolic 35 cm/sec CCA:  703 cm/sec SYSTOLIC ICA/CCA RATIO:  1.6 ECA:  22 cm/sec Right Brachial SBP: Not acquired Left Brachial SBP: Not acquired RIGHT CAROTID ARTERY: Mild calcifications of the right common carotid artery. Intermediate waveform maintained. Heterogeneous and partially calcified plaque at the right carotid bifurcation. No significant lumen shadowing. Low resistance waveform of the right ICA. Mild tortuosity RIGHT VERTEBRAL ARTERY: Antegrade flow with low resistance waveform. LEFT CAROTID ARTERY: No significant calcifications of the left common carotid artery. Intermediate waveform maintained. Heterogeneous and partially calcified plaque at the left carotid bifurcation without significant lumen shadowing. Low resistance  waveform of the left ICA. Mild tortuosity LEFT VERTEBRAL ARTERY:  Antegrade flow with low resistance waveform. IMPRESSION: Right: Color duplex indicates moderate heterogeneous and calcified plaque, with no hemodynamically significant stenosis by  duplex criteria in the extracranial cerebrovascular circulation. Left: Moderate heterogeneous and partially calcified plaque at the carotid bifurcation, with discordant results regarding degree of stenosis by established duplex criteria. Peak velocity suggests 50%- 69% stenosis, with the ICA/ CCA ratio suggesting a lesser degree of stenosis. If establishing a more accurate degree of stenosis is required, cerebral angiogram should be considered, or as a second best test, CTA. Signed, Dulcy Fanny. Earleen Newport, DO [facility] Electronically Signed   By: Corrie Mckusick D.O.   On: 05/03/2017 10:48     ASSESSMENT AND PLAN:   * Hypoglycemia 13 and 17 on accuchecks Given STAT D50 x 2 Started on D5 But no hypoglycemic symptoms Checked random glucose in lab and glucose at 88. Stopped D5. erroneous results on accuchecks  * L parietal small lacunar infarct ASA, Statin Echo pending Appreciate neurology input  * Hypertension  All the records are reviewed and case discussed with Care Management/Social Workerr. Management plans discussed with the patient, family and they are in agreement.  CODE STATUS: FULL CODE  DVT Prophylaxis: SCDs  TOTAL CC TIME TAKING CARE OF THIS PATIENT: 35 minutes.  For hypoglycemia  POSSIBLE D/C IN 1-2 DAYS, DEPENDING ON CLINICAL CONDITION.  Hillary Bow R M.D on 05/04/2017 at 9:10 AM  Between 7am to 6pm - Pager - [phone]  After 6pm go to [email or web address] - password [email or web address] [facility]  Office  [phone]  CC: Primary care physician; Rosaland Lao, NP  Note: This dictation was prepared with Dragon dictation along with smaller phrase  technology. Any transcriptional errors that result from this process are unintentional.

## 2017-05-04 NOTE — Progress Notes (Addendum)
Initial Nutrition Assessment  DOCUMENTATION CODES:   Not applicable  INTERVENTION:   Magic cup TID with meals, each supplement provides 290 kcal and 9 grams of protein  Recommend check B-12 levels as pt with neuropathy and h/o B-12 anemia  Assist with ordering meals   NUTRITION DIAGNOSIS:   Inadequate oral intake related to  (advanced age and dyspahgia ) as evidenced by meal completion < 50%.  GOAL:   Patient will meet greater than or equal to 90% of their needs  MONITOR:   PO intake, Supplement acceptance, Labs, Weight trends  REASON FOR ASSESSMENT:   Malnutrition Screening Tool    ASSESSMENT:   83 y.o. female with a known history of chronic kidney disease stage III, bronchial asthma, CVA with right-sided weakness ,chronic pain, diabetes mellitus presented to the emergency room with tingling in the right foot and right lower extremity. Pt found to have  L parietal small lacunar infarct.   Met with pt in room today. Pt reports good appetite pta. Pt reports that normally she eats well at the SNF where she resides but reports that she hates the hospital food and this is why she is not eating well here. Pt reports that she used to drink Ensure but she got tired of it. Pt s/p CSE today and approved for DYS 1/nectar thick diet. Pt eating 40% of meals. Pt with h/o esophageal dysphagia secondary to stricture. Pt getting Magic Cups on all meal trays. Per chart, pt is weight stable. Pt reports chronic tingling in her legs and feet; denies any tingling in her hands. Pt with h/o B-12 deficiency; recommend check B-12 levels. Pt is receiving 1056mg of B-12 daily but may not be absorbing it well r/t hypochlorhydria. Pt to possibly discharge today.       Medications reviewed and include: allopurinol, aspirin, Vit D, synthroid, protonix, Vit B-12  Labs reviewed: creat 1.25(H), AlkPhos 133(H), Alb 3.2(L)- 9/15  Nutrition-Focused physical exam completed. Findings are no fat depletion,  moderate muscle depletions in temporal region and clavicles, and mild edema in BLE.   Diet Order:  DIET - DYS 1 Room service appropriate? Yes with Assist; Fluid consistency: Nectar Thick  Skin:  Reviewed, no issues  Last BM:  9/15  Height:   Ht Readings from Last 1 Encounters:  05/03/17 '5\' 5"'$  (1.651 m)    Weight:   Wt Readings from Last 1 Encounters:  05/03/17 178 lb 12.8 oz (81.1 kg)    Ideal Body Weight:  56.8 kg  BMI:  Body mass index is 29.75 kg/m.  Estimated Nutritional Needs:   Kcal:  1400-1600kcal/day   Protein:  81-89g/day   Fluid:  >1.5L/day   EDUCATION NEEDS:   No education needs identified at this time  CKoleen DistanceMS, RD, LMelbournePager #-[phone] Hours Pager: [phone]

## 2017-05-04 NOTE — Care Management Note (Signed)
Case Management Note  Patient Details  Name: [name] MRN: [id number] Date of Birth: [birth year]  Subjective/Objective:   Admitted to [facility] with the diagnosis of CVA. A resident of [facility].  Discharge to home today per Dr. Darvin Neighbours                 Action/Plan: Will be followed by [facility]   Expected Discharge Date:  05/04/17               Expected Discharge Plan:     In-House Referral:     Discharge planning Services     Post Acute Care Choice:    Choice offered to:     DME Arranged:    DME Agency:     HH Arranged:    HH Agency:     Status of Service:     If discussed at [employer] of Stay Meetings, dates discussed:    Additional Comments:  Shelbie Ammons, RN MSN CCM Care Management [phone] 05/04/2017, 2:18 PM

## 2017-05-05 LAB — GLUCOSE, CAPILLARY: GLUCOSE-CAPILLARY: 88 mg/dL (ref 65–99)

## 2017-05-14 ENCOUNTER — Other Ambulatory Visit: Payer: Self-pay | Admitting: Gastroenterology

## 2017-05-14 DIAGNOSIS — R1319 Other dysphagia: Secondary | ICD-10-CM

## 2017-05-19 ENCOUNTER — Ambulatory Visit
Admission: RE | Admit: 2017-05-19 | Discharge: 2017-05-19 | Disposition: A | Discharge status: Home / Self Care | Payer: Medicare Other | Source: Ambulatory Visit | Attending: Gastroenterology | Admitting: Gastroenterology

## 2017-05-19 DIAGNOSIS — R1312 Dysphagia, oropharyngeal phase: Secondary | ICD-10-CM

## 2017-05-19 DIAGNOSIS — R1319 Other dysphagia: Secondary | ICD-10-CM | POA: Insufficient documentation

## 2017-05-19 NOTE — Therapy (Addendum)
[name], [place], [address] Phone: [phone]   Fax:     Modified Barium Swallow  Patient Details  Name: [name] MRN: [id number] Date of Birth: [birth year] No Data Recorded  Encounter Date: 05/19/2017      End of Session - 05/19/17 1511    Visit Number 1   Number of Visits 1   Date for SLP Re-Evaluation 05/19/17   SLP Start Time 1400   SLP Stop Time  1500   SLP Time Calculation (min) 60 min   Activity Tolerance Patient tolerated treatment well      Past Medical History:  Diagnosis Date  . Abnormal glucose   . Anxiety   . Arthritis   . Asthma   . Calculus of gallbladder without mention of cholecystitis, with obstruction   . Cancer ([place])    kidney  . Candidiasis of the esophagus   . Cataract cortical, senile   . Chronic kidney disease, stage III (moderate)   . Chronic pain   . Chronic pain syndrome   . Corns and callosities   . Debility   . Depression   . Dermatophytosis of nail   . Diabetes mellitus without complication ([place])    unspecified, without mention of complication  . Difficult intubation   . Dysphagia   . Dyspnea    with exertion  . Edema, unspecified   . Emphysema lung ([place])   . Emphysema of lung ([place])   . Esophageal reflux   . Essential hypertension, benign   . Generalized atherosclerosis   . Glaucoma (increased eye pressure)   . Gout, unspecified   . Hemiplegia ([place])   . Hereditary and idiopathic peripheral neuropathy   . Hyperglycemia   . Hyperlipidemia   . Hypertension   . Hypoglycemia, unspecified   . Hypopotassemia   . Hypothyroidism   . Migraine, unspecified, without mention of intractable migraine without mention of status migrainosus   . Other vitamin B12 deficiency anemia   . Pain in limb   . Peripheral vascular disease ([place])   . Personal history of fall   . Stroke ([place])   . Thyroid disease   . Ulcer of foot ([place])   . Unspecified  asthma(493.90)   . Unspecified constipation   . Unspecified hypertensive heart disease with heart failure(402.91)   . Unspecified urinary incontinence   . Unspecified vitamin D deficiency     Past Surgical History:  Procedure Laterality Date  . ABDOMINAL HYSTERECTOMY    . arterial thrombi stents in both legs    . BACK SURGERY    . BACK SURGERY    . CATARACT EXTRACTION    . COLONOSCOPY    . ESOPHAGOGASTRODUODENOSCOPY (EGD) WITH PROPOFOL N/A 06/19/2016   Procedure: ESOPHAGOGASTRODUODENOSCOPY (EGD) WITH PROPOFOL;  Surgeon: Lollie Sails, MD;  Location: [facility] ENDOSCOPY;  Service: Endoscopy;  Laterality: N/A;  . ESOPHAGOGASTRODUODENOSCOPY (EGD) WITH PROPOFOL N/A 04/10/2017   Procedure: ESOPHAGOGASTRODUODENOSCOPY (EGD) WITH PROPOFOL;  Surgeon: Lollie Sails, MD;  Location: [facility] ENDOSCOPY;  Service: Endoscopy;  Laterality: N/A;  . EYE SURGERY     cataract extraction  . LOWER EXTREMITY ANGIOGRAPHY Right 10/07/2016   Procedure: Lower Extremity Angiography;  Surgeon: Katha Cabal, MD;  Location: [place] CV LAB;  Service: Cardiovascular;  Laterality: Right;  . PERIPHERAL VASCULAR CATHETERIZATION Left 09/02/2016   Procedure: Lower Extremity Angiography;  Surgeon: Katha Cabal, MD;  Location: [place] CV LAB;  Service: Cardiovascular;  Laterality: Left;  .  PERIPHERAL VASCULAR CATHETERIZATION Right 09/16/2016   Procedure: Lower Extremity Angiography;  Surgeon: Katha Cabal, MD;  Location: [place] CV LAB;  Service: Cardiovascular;  Laterality: Right;    There were no vitals filed for this visit.     Subjective: Patient behavior: (alertness, ability to follow instructions, etc.): pt alert, verbally conversive and answered questions appropriately. She stated she had returned to more of a "regular diet again" since discharging from the hospital. She was easily fatigued w/ movements to and from the swallow chair. Pt wears dentures.  Chief complaint: Dysphagia. Pt  was recently admitted to the hospital for new parietal CVA dx but has been followed by GI services for Esophageal dysmotility ongoing and was to have a repeats EGD on 05/22/2017 for dilitation; she continues to c/o a "full" feeling when eating/drinking at meals and also endorses regurgitation episodes. No consistent, overt s/s of aspiration were noted during a BSE while admitted to Acute care 04/2017, but pt was recommended to be on a Dysphagia 1(puree) w/ Nectar consistency liquids diet w/ aspiration and Reflux precautions.    Objective:  Radiological Procedure: A videoflouroscopic evaluation of oral-preparatory, reflex initiation, and pharyngeal phases of the swallow was performed; as well as a screening of the upper esophageal phase.  I. POSTURE: upright    II. VIEW: laterial III. COMPENSATORY STRATEGIES: chin tuck w/ thin liquid trials; f/u, dry swallowing to clear any oral (and pharyngeal) residue (tends to piecemeal w/ boluses) IV. BOLUSES ADMINISTERED: liquids via Cup only  Thin Liquid: 4 trials  Nectar-thick Liquid: 3 trials  Honey-thick Liquid: NT  Puree: 3 trials  Mechanical Soft: 1 trial V. RESULTS OF EVALUATION: A. ORAL PREPARATORY PHASE: (The lips, tongue, and velum are observed for strength and coordination)       **Overall Severity Rating: MILD-MODERATE. Noted a slight munching of bolus material w/ solid texture food; min oral residue and piecemealing of all bolus trial consistencies; premature spillage of thin and nectar liquid trials into the pharynx - reduced bolus control w/ liquids  B. SWALLOW INITIATION/REFLEX: (The reflex is normal if "triggered" by the time the bolus reached the base of the tongue)  **Overall Severity Rating: MODERATE. Delayed pharyngeal swallow initiation w/ moreso w/ liquids resulting in silent Aspiration w/ thin liquids. Still a delay in pharyngeal swallow initiation w/ nectar liquids but timely, adequate airway closure w/ no laryngeal penetration or  aspiration noted during trials - all via Cup(no straw used)  C. PHARYNGEAL PHASE: (Pharyngeal function is normal if the bolus shows rapid, smooth, and continuous transit through the pharynx and there is no pharyngeal residue after the swallow)  **Overall Severity Rating: MILD. no initial pharyngeal residue post swallow indicating adequate laryngeal excursion and pharyngeal pressure during the swallow, however, min oral residue remaining seeped into the pharynx settling in the valleculae and pyriform sinuses(diffuse). Pt utilized a f/u, dry swallow (usually independently) and reduced/cleared this pharyngeal residue.   D. LARYNGEAL PENETRATION: (Material entering into the laryngeal inlet/vestibule but not aspirated): min x2 w/ thin liquids utilizing chin tuck  E. ASPIRATION: x1 w/ thin liquids(no chin tuck) F. ESOPHAGEAL PHASE: (Screening of the upper esophagus): appeared wfl    ASSESSMENT: Pt appears to present w/ Mild-Moderate oropharyngeal phase dysphagia w/ increased risk for aspiration moreso w/ thin liquids. During the oral phase, pt presented w/ piecemealing of all bolus consistencies and min decreased lingual control/coordination resulting in min oral residue and need to use f/u, multiple swallows for full oral clearing of bolus material; this impacted  timely A-P transfer and cohesion. The slight-min oral residue post swallowing was cleared adequately b/t trials w/ independent lingual sweeping attention and f/u swallows. A min munching pattern vs rotary chew noted w/ increased textured foods. Premature spillage of liquids was noted indicating decreased bolus control. During the pharyngeal phase, pt exhibited delayed pharyngeal swallow initiation w/ thin and nectar liquids(via Cup) spilling to the Pyriform Sinuses and purees spilling to the Valleculae before pharyngeal swallow initiation engaged resulting in decreased airway closure/timing. Aspiration(SILENT) occurred before the swallow w/ thin  liquids x1 - much delayed cough response noted. A much more timely pharyngeal swallow initiation was noted w/ nectar consistency liquid. Of note, when using the chin tuck strategy during trials of thin liquids, pt was able to achieve some airway protection w/ no further aspiration noted; slight-min laryngeal penetration noted still. Pt exhibited no significant pharyngeal residue post swallowing indicating adequate pharyngeal pressure and laryngeal excursion during swallowing. The min oral residue did seep into the pharynx collecting in the valleculae and pyriform sinuses after the swallow which she was able to reduce/clear using a f/u, dry swallow (usually independently).  Recommend pt follow more of a Dysphagia 3(well-cut foods and even minced meats, moistened for easier mastication and Esophageal clearing) diet w/ NECTAR liquids via CUP only - NO Straws. Recommend aspiration precautions w/ any oral intake; Reflux precautions. Consider option of PLEASURE sips of Water using Chin Tuck strategy via Cup BETWEEN meals following aspiration precautions and POST thorough oral care if endorsed by MD and pt/family desires - education on the precautions and follow through w/ such. Recommend monitoring for any Pulmonary decline or increased chest congestion/pneumonia. Can consider use of Dysphagia Drink Cup for pt. Continue to monitor Esophageal dysmotility issues and have f/u w/ GI as any person is at increased risk for aspiration of Reflux/regurgitated material.   PLAN/RECOMMENDATIONS:  A. Diet: Dysphagia level 3 w/ minced meats, moistened foods; Nectar liquids; Aspiration precautions; Reflux precautions - No Straws recommended. Pills in Puree for safer swallowing  B. Swallowing Precautions: aspiration precautions; No Straws  C. Recommended consultation to f/u w/ SLP at SNF for further education on aspiration precautions; strategies; oral care; dysphagia diet - dysphagia drink cup  D. Therapy recommendations: f/u w/  SLP at SNF for education; toleration of diet  E. Results and recommendations were discussed w/ pt; video viewed and discussed thoroughly w/ pt          Patient will benefit from skilled therapeutic intervention in order to improve the following deficits and impairments:   Dysphagia, oropharyngeal phase  Other dysphagia - Plan: DG OP Swallowing Func-Medicare/Speech Path, DG OP Swallowing Func-Medicare/Speech Path      G-Codes - June 07, 2017 1513    Functional Assessment Tool Used clinical judgement   Functional Limitations Swallowing   Swallow Current Status (X4481) At least 20 percent but less than 40 percent impaired, limited or restricted   Swallow Goal Status (E5631) At least 20 percent but less than 40 percent impaired, limited or restricted   Swallow Discharge Status [phone]) At least 20 percent but less than 40 percent impaired, limited or restricted          Problem List Patient Active Problem List   Diagnosis Date Noted  . Hyperlipidemia 05/04/2017  . Hypothyroidism 05/04/2017  . Acute CVA (cerebrovascular accident) ([place]) 05/04/2017  . Gout 10/27/2016  . CVA (cerebral vascular accident) ([place]) 07/20/2016  . Essential hypertension 07/14/2016  . Atherosclerosis of native arteries of the extremities with gangrene ([place]) 07/14/2016  .  PVD (peripheral vascular disease) ([place]) 07/14/2016      Orinda Kenner, MS, CCC-SLP Caliah Kopke 05/19/2017, 3:14 PM  [facility] [place], [place], [address] Phone: [phone]   Fax:     Name: [name] MRN: [id number] Date of Birth: 06/02/1928

## 2017-05-22 ENCOUNTER — Encounter: Admission: RE | Payer: Self-pay | Source: Ambulatory Visit

## 2017-05-22 ENCOUNTER — Encounter: Payer: Self-pay | Admitting: Anesthesiology

## 2017-05-22 ENCOUNTER — Ambulatory Visit: Admission: RE | Admit: 2017-05-22 | Payer: Medicare Other | Source: Ambulatory Visit | Admitting: Gastroenterology

## 2017-05-22 SURGERY — ESOPHAGOGASTRODUODENOSCOPY (EGD) WITH PROPOFOL
Anesthesia: General

## 2017-07-28 ENCOUNTER — Ambulatory Visit (INDEPENDENT_AMBULATORY_CARE_PROVIDER_SITE_OTHER): Payer: Medicare Other | Admitting: Vascular Surgery

## 2017-07-29 ENCOUNTER — Encounter (INDEPENDENT_AMBULATORY_CARE_PROVIDER_SITE_OTHER): Payer: Self-pay | Admitting: Vascular Surgery

## 2017-07-29 ENCOUNTER — Ambulatory Visit (INDEPENDENT_AMBULATORY_CARE_PROVIDER_SITE_OTHER): Payer: Medicare Other | Admitting: Vascular Surgery

## 2017-07-29 VITALS — BP 127/67 | HR 73 | Resp 15 | Ht 64.0 in | Wt 189.0 lb

## 2017-07-29 DIAGNOSIS — I83813 Varicose veins of bilateral lower extremities with pain: Secondary | ICD-10-CM | POA: Insufficient documentation

## 2017-07-29 DIAGNOSIS — I739 Peripheral vascular disease, unspecified: Secondary | ICD-10-CM

## 2017-07-29 DIAGNOSIS — R600 Localized edema: Secondary | ICD-10-CM

## 2017-07-29 NOTE — Progress Notes (Signed)
Subjective:    Patient ID: [name], female    DOB: July 19, 1928, 83 y.o.   MRN: [id number] Chief Complaint  Patient presents with  . Follow-up    Knot on calf   Patient presents with a chief complaint of a "knot" located on the back of her left shin.  This has been present for approximately 1 month and causes her discomfort.  The patient lives at a assisted living residence and by the [place].  The patient states DVT was ruled out by her assisted living however no results were provided in her paperwork or found in epic.  At this time, the patient does not engage in conservative therapy including wearing medical grade 1 compression stockings or elevation of her lower extremity.  The patient notes swelling to the bilateral lower extremity which worsens towards the end of the day.  The discomfort to the "knot" also worsens towards the end of the day.  The patient denies any claudication-like symptoms, rest pain or ulceration to the lower extremity.  The patient denies any erythema to the bilateral lower extremity.  The patient denies any fever, nausea or vomiting.   Review of Systems  Constitutional: Negative.   HENT: Negative.   Eyes: Negative.   Respiratory: Negative.   Cardiovascular: Positive for leg swelling.       Left calf "knot"  Gastrointestinal: Negative.   Endocrine: Negative.   Genitourinary: Negative.   Musculoskeletal: Negative.   Skin: Negative.   Allergic/Immunologic: Negative.   Neurological: Negative.   Hematological: Negative.   Psychiatric/Behavioral: Negative.       Objective:   Physical Exam  Constitutional: She is oriented to person, place, and time. She appears well-developed and well-nourished. No distress.  HENT:  Head: Normocephalic and atraumatic.  Eyes: Conjunctivae are normal. Pupils are equal, round, and reactive to light.  Neck: Normal range of motion.  Cardiovascular: Normal rate, regular rhythm, normal heart sounds and intact distal  pulses.  Pulses:      Radial pulses are 2+ on the right side, and 2+ on the left side.  Left calf: Soft.  Nontender to palpation.  No pain with dorsiflexion.  No erythema.  There are palpable varicosities noted.  There is no stasis dermatitis.  No cellulitis.  Pulmonary/Chest: Effort normal and breath sounds normal.  Musculoskeletal: Normal range of motion. She exhibits edema (Mild to moderate nonpitting edema noted bilaterally).  Neurological: She is alert and oriented to person, place, and time.  Skin: She is not diaphoretic.  Less than 1 cm scattered varicosities noted to the lower extremity bilaterally.  There is no cellulitis.  There is no stasis dermatitis.   Psychiatric: She has a normal mood and affect. Her behavior is normal. Judgment and thought content normal.  Vitals reviewed.  BP 127/67 (BP Location: Left Arm)   Pulse 73   Resp 15   Ht 5\' 4"  (1.626 m)   Wt 189 lb (85.7 kg)   LMP  (LMP Unknown)   BMI 32.44 kg/m   Past Medical History:  Diagnosis Date  . Abnormal glucose   . Anxiety   . Arthritis   . Asthma   . Calculus of gallbladder without mention of cholecystitis, with obstruction   . Cancer ([place])    kidney  . Candidiasis of the esophagus   . Cataract cortical, senile   . Chronic kidney disease, stage III (moderate) ([facility])   . Chronic pain   . Chronic pain syndrome   . Corns and  callosities   . Debility   . Depression   . Dermatophytosis of nail   . Diabetes mellitus without complication ([place])    unspecified, without mention of complication  . Difficult intubation   . Dysphagia   . Dyspnea    with exertion  . Edema, unspecified   . Emphysema lung ([place])   . Emphysema of lung ([place])   . Esophageal reflux   . Essential hypertension, benign   . Generalized atherosclerosis   . Glaucoma (increased eye pressure)   . Gout, unspecified   . Hemiplegia ([place])   . Hereditary and idiopathic peripheral neuropathy   . Hyperglycemia   . Hyperlipidemia   .  Hypertension   . Hypoglycemia, unspecified   . Hypopotassemia   . Hypothyroidism   . Migraine, unspecified, without mention of intractable migraine without mention of status migrainosus   . Other vitamin B12 deficiency anemia   . Pain in limb   . Peripheral vascular disease ([place])   . Personal history of fall   . Stroke ([place])   . Thyroid disease   . Ulcer of foot ([place])   . Unspecified asthma(493.90)   . Unspecified constipation   . Unspecified hypertensive heart disease with heart failure(402.91)   . Unspecified urinary incontinence   . Unspecified vitamin D deficiency    Social History   Socioeconomic History  . Marital status: Single    Spouse name: Not on file  . Number of children: Not on file  . Years of education: Not on file  . Highest education level: Not on file  Social Needs  . Financial resource strain: Not on file  . Food insecurity - worry: Not on file  . Food insecurity - inability: Not on file  . Transportation needs - medical: Not on file  . Transportation needs - non-medical: Not on file  Occupational History  . Occupation: retired  Tobacco Use  . Smoking status: Never Smoker  . Smokeless tobacco: Never Used  Substance and Sexual Activity  . Alcohol use: No  . Drug use: No  . Sexual activity: Not Currently  Other Topics Concern  . Not on file  Social History Narrative  . Not on file   Past Surgical History:  Procedure Laterality Date  . ABDOMINAL HYSTERECTOMY    . arterial thrombi stents in both legs    . BACK SURGERY    . BACK SURGERY    . CATARACT EXTRACTION    . COLONOSCOPY    . ESOPHAGOGASTRODUODENOSCOPY (EGD) WITH PROPOFOL N/A 06/19/2016   Procedure: ESOPHAGOGASTRODUODENOSCOPY (EGD) WITH PROPOFOL;  Surgeon: Lollie Sails, MD;  Location[facility] ENDOSCOPY;  Service: Endoscopy;  Laterality: N/A;  . ESOPHAGOGASTRODUODENOSCOPY (EGD) WITH PROPOFOL N/A 04/10/2017   Procedure: ESOPHAGOGASTRODUODENOSCOPY (EGD) WITH PROPOFOL;  Surgeon: Lollie Sails, MD;  Location: [facility] ENDOSCOPY;  Service: Endoscopy;  Laterality: N/A;  . EYE SURGERY     cataract extraction  . LOWER EXTREMITY ANGIOGRAPHY Right 10/07/2016   Procedure: Lower Extremity Angiography;  Surgeon: Katha Cabal, MD;  Location: [place] CV LAB;  Service: Cardiovascular;  Laterality: Right;  . PERIPHERAL VASCULAR CATHETERIZATION Left 09/02/2016   Procedure: Lower Extremity Angiography;  Surgeon: Katha Cabal, MD;  Location: [place] CV LAB;  Service: Cardiovascular;  Laterality: Left;  . PERIPHERAL VASCULAR CATHETERIZATION Right 09/16/2016   Procedure: Lower Extremity Angiography;  Surgeon: Katha Cabal, MD;  Location: [place] CV LAB;  Service: Cardiovascular;  Laterality: Right;   Family History  Problem Relation  Age of Onset  . Cancer Father   . Stroke Father   . Seizures Mother   . Lung cancer Sister   . Bone cancer Sister    No Known Allergies     Assessment & Plan:  Patient presents with a chief complaint of a "knot" located on the back of her left shin.  This has been present for approximately 1 month and causes her discomfort.  The patient lives at a assisted living residence and by the [place].  The patient states DVT was ruled out by her assisted living however no results were provided in her paperwork or found in epic.  At this time, the patient does not engage in conservative therapy including wearing medical grade 1 compression stockings or elevation of her lower extremity.  The patient notes swelling to the bilateral lower extremity which worsens towards the end of the day.  The discomfort to the "knot" also worsens towards the end of the day.  The patient denies any claudication-like symptoms, rest pain or ulceration to the lower extremity.  The patient denies any erythema to the bilateral lower extremity.  The patient denies any fever, nausea or vomiting.  1. Varicose veins of both lower extremities with pain - New Patient  with a palpable varicosity to the left shin.  Noninfected.  No phlebitis.  No cellulitis. The patient was encouraged to wear graduated compression stockings (20-30 mmHg) on a daily basis. The patient was instructed to begin wearing the stockings first thing in the morning and removing them in the evening. The patient was instructed specifically not to sleep in the stockings. Prescription given. In addition, behavioral modification including elevation during the day will be initiated. Anti-inflammatories for pain. I will bring the patient back in approximately 1 month to assess her progress with conservative therapy I will also have the patient undergo a bilateral venous duplex to rule out any contributing reflux disease Should not may benefit from a lymphedema pump if her symptoms are not controlled through conservative therapy  - VAS [place] LOWER EXTREMITY VENOUS REFLUX; Future  2. Bilateral lower extremity edema - New As above  3. PVD (peripheral vascular disease) ([place]) - Stable Patient presents asymptomatically today. We do follow this on a regular basis. Bilateral feet are warm faint pedal pulses skin is intact No intervention is indicated at this time  Current Outpatient Medications on File Prior to Visit  Medication Sig Dispense Refill  . acetaminophen (TYLENOL) 325 MG tablet Take 650 mg by mouth every 4 (four) hours as needed for mild pain (Dont exceed 300 mg in 24 hours from all sources).    Marland Kitchen allopurinol (ZYLOPRIM) 100 MG tablet Take 100 mg by mouth daily.    Marland Kitchen amitriptyline (ELAVIL) 10 MG tablet Take 10 mg by mouth at bedtime.    Marland Kitchen aspirin EC 81 MG EC tablet Take 1 tablet (81 mg total) by mouth daily. 30 tablet 0  . atorvastatin (LIPITOR) 40 MG tablet Take 1 tablet (40 mg total) by mouth daily at 6 PM. 30 tablet 1  . Cholecalciferol (VITAMIN D-3) 1000 UNITS CAPS Take 2,000 Units by mouth daily.     . clopidogrel (PLAVIX) 75 MG tablet Take 75 mg by mouth daily with breakfast.    .  dexlansoprazole (DEXILANT) 60 MG capsule Take 60 mg by mouth daily.    . DULoxetine (CYMBALTA) 30 MG capsule Take 30 mg by mouth every morning.    . furosemide (LASIX) 20 MG tablet Take 1  tablet (20 mg total) by mouth every other day. 30 tablet 0  . gabapentin (NEURONTIN) 600 MG tablet     . guaiFENesin (MUCINEX) 600 MG 12 hr tablet Take by mouth 2 (two) times daily.    Marland Kitchen latanoprost (XALATAN) 0.005 % ophthalmic solution Place 1 drop into both eyes at bedtime.    Marland Kitchen levothyroxine (SYNTHROID, LEVOTHROID) 25 MCG tablet Take 25 mcg by mouth daily before breakfast.    . metoprolol tartrate (LOPRESSOR) 25 MG tablet Take 12.5 mg by mouth 2 (two) times daily.    . pantoprazole (PROTONIX) 40 MG tablet Take 40 mg by mouth 2 (two) times daily.     . potassium chloride (MICRO-K) 10 MEQ CR capsule Take 10 mEq by mouth 2 (two) times daily.    . Probiotic Product (ALIGN) 4 MG CAPS Take 4 mg by mouth daily.    Marland Kitchen senna (SENOKOT) 8.6 MG tablet Take 1 tablet by mouth 3 (three) times daily as needed for constipation.    . topiramate (TOPAMAX) 25 MG tablet Take 25 mg by mouth at bedtime.    . traMADol (ULTRAM) 50 MG tablet Take 50 mg by mouth every 8 (eight) hours.     . vitamin B-12 (CYANOCOBALAMIN) 1000 MCG tablet Take 1,000 mcg by mouth daily.     Current Facility-Administered Medications on File Prior to Visit  Medication Dose Route Frequency Provider Last Rate Last Dose  . ceFAZolin (ANCEF) IVPB 1 g/50 mL premix  1 g Intravenous Once Schnier, Dolores Lory, MD        There are no Patient Instructions on file for this visit. No Follow-up on file.   Robbyn Hodkinson A Anahla Bevis, PA-C

## 2017-08-10 ENCOUNTER — Emergency Department
Admission: EM | Admit: 2017-08-10 | Discharge: 2017-08-10 | Disposition: A | Discharge status: Home / Self Care | Payer: Medicare Other | Attending: Emergency Medicine | Admitting: Emergency Medicine

## 2017-08-10 ENCOUNTER — Encounter: Payer: Self-pay | Admitting: Emergency Medicine

## 2017-08-10 DIAGNOSIS — S01512A Laceration without foreign body of oral cavity, initial encounter: Secondary | ICD-10-CM | POA: Insufficient documentation

## 2017-08-10 DIAGNOSIS — Z7901 Long term (current) use of anticoagulants: Secondary | ICD-10-CM | POA: Diagnosis not present

## 2017-08-10 DIAGNOSIS — J45909 Unspecified asthma, uncomplicated: Secondary | ICD-10-CM | POA: Insufficient documentation

## 2017-08-10 DIAGNOSIS — Y999 Unspecified external cause status: Secondary | ICD-10-CM | POA: Diagnosis not present

## 2017-08-10 DIAGNOSIS — I129 Hypertensive chronic kidney disease with stage 1 through stage 4 chronic kidney disease, or unspecified chronic kidney disease: Secondary | ICD-10-CM | POA: Diagnosis not present

## 2017-08-10 DIAGNOSIS — N183 Chronic kidney disease, stage 3 (moderate): Secondary | ICD-10-CM | POA: Insufficient documentation

## 2017-08-10 DIAGNOSIS — Z79899 Other long term (current) drug therapy: Secondary | ICD-10-CM | POA: Diagnosis not present

## 2017-08-10 DIAGNOSIS — I2510 Atherosclerotic heart disease of native coronary artery without angina pectoris: Secondary | ICD-10-CM | POA: Insufficient documentation

## 2017-08-10 DIAGNOSIS — Z7982 Long term (current) use of aspirin: Secondary | ICD-10-CM | POA: Diagnosis not present

## 2017-08-10 DIAGNOSIS — X58XXXA Exposure to other specified factors, initial encounter: Secondary | ICD-10-CM | POA: Insufficient documentation

## 2017-08-10 DIAGNOSIS — Y9389 Activity, other specified: Secondary | ICD-10-CM | POA: Diagnosis not present

## 2017-08-10 DIAGNOSIS — E1122 Type 2 diabetes mellitus with diabetic chronic kidney disease: Secondary | ICD-10-CM | POA: Insufficient documentation

## 2017-08-10 DIAGNOSIS — E039 Hypothyroidism, unspecified: Secondary | ICD-10-CM | POA: Insufficient documentation

## 2017-08-10 DIAGNOSIS — Y929 Unspecified place or not applicable: Secondary | ICD-10-CM | POA: Insufficient documentation

## 2017-08-10 MED ORDER — LIDOCAINE-EPINEPHRINE-TETRACAINE (LET) SOLUTION
3.0000 mL | Freq: Once | NASAL | Status: AC
  Administered 2017-08-10: 3 mL via TOPICAL
  Filled 2017-08-10: qty 3

## 2017-08-10 NOTE — ED Triage Notes (Addendum)
Patient presents to ED via [facility] from [facility]. A&O x4. Patient reports she has sucked on 10 jolly ranchers today and she cut her tongue. Bleeding noted. Patient given gauze and instructed to apply pressure. Patient takes 81mg  asa daily.

## 2017-08-10 NOTE — ED Triage Notes (Signed)
First Nurse Note:  Arrives from  [facility] with c/o bleeding from tongue.  No known trauma.

## 2017-08-10 NOTE — ED Provider Notes (Signed)
[facility] Emergency Department Provider Note  ____________________________________________  Time seen: Approximately 2:57 PM  I have reviewed the triage vital signs and the nursing notes.   HISTORY  Chief Complaint Laceration   HPI [name] is a 83 y.o. female who presents to the emergency department for evaluation and treatment of bleeding to the tongue that was caused by eating [employer]. Patient states that her symptoms started around lunch and she has not been able to get it to stop.  She states that she has been applying pressure since she noticed the bleeding.  She denies other symptoms of concern.  Past Medical History:  Diagnosis Date  . Abnormal glucose   . Anxiety   . Arthritis   . Asthma   . Calculus of gallbladder without mention of cholecystitis, with obstruction   . Cancer ([place])    kidney  . Candidiasis of the esophagus   . Cataract cortical, senile   . Chronic kidney disease, stage III (moderate) (HCC)   . Chronic pain   . Chronic pain syndrome   . Corns and callosities   . Debility   . Depression   . Dermatophytosis of nail   . Diabetes mellitus without complication ([place])    unspecified, without mention of complication  . Difficult intubation   . Dysphagia   . Dyspnea    with exertion  . Edema, unspecified   . Emphysema lung ([place])   . Emphysema of lung ([place])   . Esophageal reflux   . Essential hypertension, benign   . Generalized atherosclerosis   . Glaucoma (increased eye pressure)   . Gout, unspecified   . Hemiplegia ([place])   . Hereditary and idiopathic peripheral neuropathy   . Hyperglycemia   . Hyperlipidemia   . Hypertension   . Hypoglycemia, unspecified   . Hypopotassemia   . Hypothyroidism   . Migraine, unspecified, without mention of intractable migraine without mention of status migrainosus   . Other vitamin B12 deficiency anemia   . Pain in limb   . Peripheral vascular disease ([place])   . Personal  history of fall   . Stroke ([place])   . Thyroid disease   . Ulcer of foot ([place])   . Unspecified asthma(493.90)   . Unspecified constipation   . Unspecified hypertensive heart disease with heart failure(402.91)   . Unspecified urinary incontinence   . Unspecified vitamin D deficiency     Patient Active Problem List   Diagnosis Date Noted  . Bilateral lower extremity edema 07/29/2017  . Varicose veins of both lower extremities with pain 07/29/2017  . Hyperlipidemia 05/04/2017  . Hypothyroidism 05/04/2017  . Acute CVA (cerebrovascular accident) ([place]) 05/04/2017  . Gout 10/27/2016  . CVA (cerebral vascular accident) ([place]) 07/20/2016  . Essential hypertension 07/14/2016  . Atherosclerosis of native arteries of the extremities with gangrene ([place]) 07/14/2016  . PVD (peripheral vascular disease) ([place]) 07/14/2016    Past Surgical History:  Procedure Laterality Date  . ABDOMINAL HYSTERECTOMY    . arterial thrombi stents in both legs    . BACK SURGERY    . BACK SURGERY    . CATARACT EXTRACTION    . COLONOSCOPY    . ESOPHAGOGASTRODUODENOSCOPY (EGD) WITH PROPOFOL N/A 06/19/2016   Procedure: ESOPHAGOGASTRODUODENOSCOPY (EGD) WITH PROPOFOL;  Surgeon: Lollie Sails, MD;  Location: [facility] ENDOSCOPY;  Service: Endoscopy;  Laterality: N/A;  . ESOPHAGOGASTRODUODENOSCOPY (EGD) WITH PROPOFOL N/A 04/10/2017   Procedure: ESOPHAGOGASTRODUODENOSCOPY (EGD) WITH PROPOFOL;  Surgeon: Lollie Sails, MD;  Location: [facility] ENDOSCOPY;  Service: Endoscopy;  Laterality: N/A;  . EYE SURGERY     cataract extraction  . LOWER EXTREMITY ANGIOGRAPHY Right 10/07/2016   Procedure: Lower Extremity Angiography;  Surgeon: Katha Cabal, MD;  Location: [place] CV LAB;  Service: Cardiovascular;  Laterality: Right;  . PERIPHERAL VASCULAR CATHETERIZATION Left 09/02/2016   Procedure: Lower Extremity Angiography;  Surgeon: Katha Cabal, MD;  Location: [place] CV LAB;  Service: Cardiovascular;  Laterality:  Left;  . PERIPHERAL VASCULAR CATHETERIZATION Right 09/16/2016   Procedure: Lower Extremity Angiography;  Surgeon: Katha Cabal, MD;  Location: [place] CV LAB;  Service: Cardiovascular;  Laterality: Right;    Prior to Admission medications   Medication Sig Start Date End Date Taking? Authorizing Provider  acetaminophen (TYLENOL) 325 MG tablet Take 650 mg by mouth every 4 (four) hours as needed for mild pain (Dont exceed 300 mg in 24 hours from all sources).    [provider]  allopurinol (ZYLOPRIM) 100 MG tablet Take 100 mg by mouth daily.    [provider]  amitriptyline (ELAVIL) 10 MG tablet Take 10 mg by mouth at bedtime.    [provider]  aspirin EC 81 MG EC tablet Take 1 tablet (81 mg total) by mouth daily. 07/22/16   Fritzi Mandes, MD  atorvastatin (LIPITOR) 40 MG tablet Take 1 tablet (40 mg total) by mouth daily at 6 PM. 07/22/16   Fritzi Mandes, MD  Cholecalciferol (VITAMIN D-3) 1000 UNITS CAPS Take 2,000 Units by mouth daily.     [provider]  clopidogrel (PLAVIX) 75 MG tablet Take 75 mg by mouth daily with breakfast.    [provider]  dexlansoprazole (DEXILANT) 60 MG capsule Take 60 mg by mouth daily.    [provider]  DULoxetine (CYMBALTA) 30 MG capsule Take 30 mg by mouth every morning.    [provider]  furosemide (LASIX) 20 MG tablet Take 1 tablet (20 mg total) by mouth every other day. 07/22/16   Fritzi Mandes, MD  gabapentin (NEURONTIN) 600 MG tablet  07/11/17   [provider]  guaiFENesin (MUCINEX) 600 MG 12 hr tablet Take by mouth 2 (two) times daily.    [provider]  latanoprost (XALATAN) 0.005 % ophthalmic solution Place 1 drop into both eyes at bedtime.    [provider]  levothyroxine (SYNTHROID, LEVOTHROID) 25 MCG tablet Take 25 mcg by mouth daily before breakfast.    [provider]  metoprolol tartrate (LOPRESSOR) 25 MG tablet Take 12.5 mg by mouth 2 (two)  times daily.    [provider]  pantoprazole (PROTONIX) 40 MG tablet Take 40 mg by mouth 2 (two) times daily.     [provider]  potassium chloride (MICRO-K) 10 MEQ CR capsule Take 10 mEq by mouth 2 (two) times daily.    [provider]  Probiotic Product (ALIGN) 4 MG CAPS Take 4 mg by mouth daily.    [provider]  senna (SENOKOT) 8.6 MG tablet Take 1 tablet by mouth 3 (three) times daily as needed for constipation.    [provider]  topiramate (TOPAMAX) 25 MG tablet Take 25 mg by mouth at bedtime.    [provider]  traMADol (ULTRAM) 50 MG tablet Take 50 mg by mouth every 8 (eight) hours.     [provider]  vitamin B-12 (CYANOCOBALAMIN) 1000 MCG tablet Take 1,000 mcg by mouth daily.    [provider]  Allergies Patient has no known allergies.  Family History  Problem Relation Age of Onset  . Cancer Father   . Stroke Father   . Seizures Mother   . Lung cancer Sister   . Bone cancer Sister     Social History Social History   Tobacco Use  . Smoking status: Never Smoker  . Smokeless tobacco: Never Used  Substance Use Topics  . Alcohol use: No  . Drug use: No    Review of Systems  Constitutional: Negative for fever. Respiratory: Negative for cough or shortness of breath.  Musculoskeletal: Negative for myalgias Skin: Positive for laceration to the tongue Neurological: Negative for numbness or paresthesias. ____________________________________________   PHYSICAL EXAM:  VITAL SIGNS: ED Triage Vitals  Enc Vitals Group     BP 08/10/17 1441 (!) 174/61     Pulse Rate 08/10/17 1439 76     Resp 08/10/17 1439 17     Temp 08/10/17 1439 97.7 F (36.5 C)     Temp Source 08/10/17 1439 Oral     SpO2 08/10/17 1439 98 %     Weight 08/10/17 1440 189 lb (85.7 kg)     Height 08/10/17 1440 5\' 4"  (1.626 m)     Head Circumference --      Peak Flow --      Pain Score --      Pain Loc --      Pain  Edu? --      Excl. in [place]? --      Constitutional: Well appearing. Eyes: Conjunctivae are clear without discharge or drainage. Nose: No rhinorrhea noted. Mouth/Throat: Airway is patent.  Neck: No stridor. Unrestricted range of motion observed. Does not have having completed Cardiovascular: Capillary refill is <3 seconds.  Respiratory: Respirations are even and unlabored.. Musculoskeletal: Unrestricted range of motion observed. Neurologic: Awake, alert, and oriented x 4.  Skin: Thin layer of skin over the anterior portion of the tongue is sloughing off and there is a pinpoint area of active bleeding.  There is no frank laceration or open wound in need of repair.  ____________________________________________   LABS (all labs ordered are listed, but only abnormal results are displayed)  Labs Reviewed - No data to display ____________________________________________  EKG  Not indicated ____________________________________________  RADIOLOGY  Not indicated ____________________________________________   PROCEDURES  Procedures ____________________________________________   INITIAL IMPRESSION / ASSESSMENT AND PLAN / ED COURSE  Donnella Cerino is a 83 y.o. female who presents to the emergency department for treatment of bleeding to the tip of her tongue that started around noon today.  While in the emergency department, L ET was applied and the bleeding completely stopped.  The patient was then monitored for approximately 1 hour without any sign of rebleeding.  The patient was advised that she will need to avoid eating hard foods for the next several days to give the area time to heal.  She was advised to return to the emergency department if bleeding returns and she is unable to get it to stop by using ice and compression. Medications  lidocaine-EPINEPHrine-tetracaine (LET) solution (3 mLs Topical Given 08/10/17 1509)     Pertinent labs & imaging results that were available  during my care of the patient were reviewed by me and considered in my medical decision making (see chart for details). ____________________________________________   FINAL CLINICAL IMPRESSION(S) / ED DIAGNOSES  Final diagnoses:  Tongue laceration, initial encounter    ED Discharge Orders    None  Note:  This document was prepared using Dragon voice recognition software and may include unintentional dictation errors.    Victorino Dike, FNP 08/10/17 1729    Earleen Newport, MD 08/12/17 [phone]

## 2017-08-10 NOTE — ED Notes (Signed)
Pt signed hardcopy of E-signature.  

## 2017-08-10 NOTE — ED Notes (Addendum)
Pt states that she was sucking some jolly ranchers and she thinks that she cut her tongue at that point. She denies pain and says she did not bite her tongue. Pt still actively bleeding. Gave her fresh guaze to use to apply pressure.

## 2017-09-02 ENCOUNTER — Ambulatory Visit (INDEPENDENT_AMBULATORY_CARE_PROVIDER_SITE_OTHER): Payer: Medicare Other

## 2017-09-02 ENCOUNTER — Ambulatory Visit (INDEPENDENT_AMBULATORY_CARE_PROVIDER_SITE_OTHER): Payer: Medicare Other | Admitting: Vascular Surgery

## 2017-09-02 ENCOUNTER — Encounter (INDEPENDENT_AMBULATORY_CARE_PROVIDER_SITE_OTHER): Payer: Self-pay | Admitting: Vascular Surgery

## 2017-09-02 VITALS — BP 131/67 | HR 66 | Resp 16 | Ht 66.0 in | Wt 189.0 lb

## 2017-09-02 DIAGNOSIS — I83813 Varicose veins of bilateral lower extremities with pain: Secondary | ICD-10-CM

## 2017-09-02 DIAGNOSIS — I872 Venous insufficiency (chronic) (peripheral): Secondary | ICD-10-CM

## 2017-09-02 DIAGNOSIS — I890 Lymphedema, not elsewhere classified: Secondary | ICD-10-CM | POA: Diagnosis not present

## 2017-09-02 NOTE — Progress Notes (Signed)
Subjective:    Patient ID: [name], female    DOB: [birth year], 84 y.o.   MRN: [id number] Chief Complaint  Patient presents with  . Follow-up   Patient presents for a monthly follow-up.  Patient presents to review vascular studies.  Since our initial visit, the patient has been wearing medical grade one compression stockings, elevating her legs and remaining active with minimal improvement to her lower extremity swelling and painful varicose veins.  The patient's symptoms have progressed to the point she is unable to function on a daily basis.  The patient notes that her symptoms are lifestyle limiting.  The patient underwent a bilateral venous reflux study which was notable for abnormal reflux in the right great saphenous and small saphenous vein.  Abnormal reflux in the left great saphenous and small saphenous vein.  No evidence of deep vein or superficial vein thrombophlebitis in the bilateral extremity.  The patient denies any fever, nausea or vomiting.   Review of Systems  Constitutional: Negative.   HENT: Negative.   Eyes: Negative.   Respiratory: Negative.   Cardiovascular: Positive for leg swelling.       Painful varicose veins  Gastrointestinal: Negative.   Endocrine: Negative.   Genitourinary: Negative.   Musculoskeletal: Negative.   Skin: Negative.   Allergic/Immunologic: Negative.   Neurological: Negative.   Hematological: Negative.   Psychiatric/Behavioral: Negative.       Objective:   Physical Exam  Constitutional: She is oriented to person, place, and time. She appears well-developed and well-nourished. No distress.  HENT:  Head: Normocephalic and atraumatic.  Eyes: Conjunctivae are normal. Pupils are equal, round, and reactive to light.  Neck: Normal range of motion.  Cardiovascular: Normal rate, regular rhythm, normal heart sounds and intact distal pulses.  Pulses:      Radial pulses are 2+ on the right side, and 2+ on the left side.  Hard to palpate  pedal pulses due to body habitus however the bilateral feet are warm  Pulmonary/Chest: Effort normal and breath sounds normal.  Musculoskeletal: Normal range of motion. She exhibits edema (Mild to moderate bilateral lower extremity edema noted nonpitting).  Neurological: She is alert and oriented to person, place, and time.  Skin: She is not diaphoretic.  Less than 1 cm and greater than 1 cm scattered varicosities to the bilateral lower extremity.  There is no stasis dermatitis, cellulitis or skin changes noted bilaterally.  Skin is intact  Psychiatric: She has a normal mood and affect. Her behavior is normal. Judgment and thought content normal.  Vitals reviewed.  BP 131/67   Pulse 66   Resp 16   Ht 5\' 6"  (1.676 m)   Wt 189 lb (85.7 kg)   LMP  (LMP Unknown)   BMI 30.51 kg/m   Past Medical History:  Diagnosis Date  . Abnormal glucose   . Anxiety   . Arthritis   . Asthma   . Calculus of gallbladder without mention of cholecystitis, with obstruction   . Cancer ([place])    kidney  . Candidiasis of the esophagus   . Cataract cortical, senile   . Chronic kidney disease, stage III (moderate) (HCC)   . Chronic pain   . Chronic pain syndrome   . Corns and callosities   . Debility   . Depression   . Dermatophytosis of nail   . Diabetes mellitus without complication ([place])    unspecified, without mention of complication  . Difficult intubation   . Dysphagia   .  Dyspnea    with exertion  . Edema, unspecified   . Emphysema lung ([place])   . Emphysema of lung ([place])   . Esophageal reflux   . Essential hypertension, benign   . Generalized atherosclerosis   . Glaucoma (increased eye pressure)   . Gout, unspecified   . Hemiplegia ([place])   . Hereditary and idiopathic peripheral neuropathy   . Hyperglycemia   . Hyperlipidemia   . Hypertension   . Hypoglycemia, unspecified   . Hypopotassemia   . Hypothyroidism   . Migraine, unspecified, without mention of intractable migraine without  mention of status migrainosus   . Other vitamin B12 deficiency anemia   . Pain in limb   . Peripheral vascular disease ([place])   . Personal history of fall   . Stroke ([place])   . Thyroid disease   . Ulcer of foot ([place])   . Unspecified asthma(493.90)   . Unspecified constipation   . Unspecified hypertensive heart disease with heart failure(402.91)   . Unspecified urinary incontinence   . Unspecified vitamin D deficiency    Social History   Socioeconomic History  . Marital status: Single    Spouse name: Not on file  . Number of children: Not on file  . Years of education: Not on file  . Highest education level: Not on file  Social Needs  . Financial resource strain: Not on file  . Food insecurity - worry: Not on file  . Food insecurity - inability: Not on file  . Transportation needs - medical: Not on file  . Transportation needs - non-medical: Not on file  Occupational History  . Occupation: retired  Tobacco Use  . Smoking status: Never Smoker  . Smokeless tobacco: Never Used  Substance and Sexual Activity  . Alcohol use: No  . Drug use: No  . Sexual activity: Not Currently  Other Topics Concern  . Not on file  Social History Narrative  . Not on file   Past Surgical History:  Procedure Laterality Date  . ABDOMINAL HYSTERECTOMY    . arterial thrombi stents in both legs    . BACK SURGERY    . BACK SURGERY    . CATARACT EXTRACTION    . COLONOSCOPY    . ESOPHAGOGASTRODUODENOSCOPY (EGD) WITH PROPOFOL N/A 06/19/2016   Procedure: ESOPHAGOGASTRODUODENOSCOPY (EGD) WITH PROPOFOL;  Surgeon: Lollie Sails, MD;  Location[facility] ENDOSCOPY;  Service: Endoscopy;  Laterality: N/A;  . ESOPHAGOGASTRODUODENOSCOPY (EGD) WITH PROPOFOL N/A 04/10/2017   Procedure: ESOPHAGOGASTRODUODENOSCOPY (EGD) WITH PROPOFOL;  Surgeon: Lollie Sails, MD;  Location: [facility] ENDOSCOPY;  Service: Endoscopy;  Laterality: N/A;  . EYE SURGERY     cataract extraction  . LOWER EXTREMITY ANGIOGRAPHY Right  10/07/2016   Procedure: Lower Extremity Angiography;  Surgeon: Katha Cabal, MD;  Location: [place] CV LAB;  Service: Cardiovascular;  Laterality: Right;  . PERIPHERAL VASCULAR CATHETERIZATION Left 09/02/2016   Procedure: Lower Extremity Angiography;  Surgeon: Katha Cabal, MD;  Location: [place] CV LAB;  Service: Cardiovascular;  Laterality: Left;  . PERIPHERAL VASCULAR CATHETERIZATION Right 09/16/2016   Procedure: Lower Extremity Angiography;  Surgeon: Katha Cabal, MD;  Location: [place] CV LAB;  Service: Cardiovascular;  Laterality: Right;   Family History  Problem Relation Age of Onset  . Cancer Father   . Stroke Father   . Seizures Mother   . Lung cancer Sister   . Bone cancer Sister    No Known Allergies     Assessment & Plan:  Patient presents for a monthly follow-up.  Patient presents to review vascular studies.  Since our initial visit, the patient has been wearing medical grade one compression stockings, elevating her legs and remaining active with minimal improvement to her lower extremity swelling and painful varicose veins.  The patient's symptoms have progressed to the point she is unable to function on a daily basis.  The patient notes that her symptoms are lifestyle limiting.  The patient underwent a bilateral venous reflux study which was notable for abnormal reflux in the right great saphenous and small saphenous vein.  Abnormal reflux in the left great saphenous and small saphenous vein.  No evidence of deep vein or superficial vein thrombophlebitis in the bilateral extremity.  The patient denies any fever, nausea or vomiting.  1. Lymphedema - New Despite conservative treatments including exercise, elevation and class I compression stockings the patient still presents with stage I lymphedema. The patient would greatly benefit from the added therapy of a lymphedema pump I will applied to the patient since her insurance in the meantime the  patient was encouraged to continue engaging in conservative therapy  2. Chronic venous insufficiency - New At this time, I will move forward and apply for a lymphedema pump The patient will follow up in 3 months.  At that time, if conservative therapy and the added lymphedema pump is not controlling her symptoms begin discuss laser ablation to the bilateral great saphenous and small saphenous veins. The patient and her aide who was present with her are in agreement  3. Varicose veins of both lower extremities with pain - Stable As above  Current Outpatient Medications on File Prior to Visit  Medication Sig Dispense Refill  . allopurinol (ZYLOPRIM) 100 MG tablet Take 100 mg by mouth daily.    Marland Kitchen amitriptyline (ELAVIL) 10 MG tablet Take 10 mg by mouth at bedtime.    Marland Kitchen aspirin EC 81 MG EC tablet Take 1 tablet (81 mg total) by mouth daily. 30 tablet 0  . atorvastatin (LIPITOR) 40 MG tablet Take 1 tablet (40 mg total) by mouth daily at 6 PM. 30 tablet 1  . Cholecalciferol (VITAMIN D-3) 1000 UNITS CAPS Take 2,000 Units by mouth daily.     . clopidogrel (PLAVIX) 75 MG tablet Take 75 mg by mouth daily with breakfast.    . DULoxetine (CYMBALTA) 30 MG capsule Take 30 mg by mouth every morning.    . furosemide (LASIX) 20 MG tablet Take 1 tablet (20 mg total) by mouth every other day. 30 tablet 0  . gabapentin (NEURONTIN) 600 MG tablet     . latanoprost (XALATAN) 0.005 % ophthalmic solution Place 1 drop into both eyes at bedtime.    Marland Kitchen levothyroxine (SYNTHROID, LEVOTHROID) 25 MCG tablet Take 25 mcg by mouth daily before breakfast.    . metoprolol tartrate (LOPRESSOR) 25 MG tablet Take 12.5 mg by mouth 2 (two) times daily.    . pantoprazole (PROTONIX) 40 MG tablet Take 40 mg by mouth 2 (two) times daily.     . Probiotic Product (ALIGN) 4 MG CAPS Take 4 mg by mouth daily.    Marland Kitchen senna (SENOKOT) 8.6 MG tablet Take 1 tablet by mouth 3 (three) times daily as needed for constipation.    . topiramate (TOPAMAX)  25 MG tablet Take 25 mg by mouth at bedtime.    . traMADol (ULTRAM) 50 MG tablet Take 50 mg by mouth every 8 (eight) hours.     . vitamin B-12 (CYANOCOBALAMIN) 1000  MCG tablet Take 1,000 mcg by mouth daily.    Marland Kitchen acetaminophen (TYLENOL) 325 MG tablet Take 650 mg by mouth every 4 (four) hours as needed for mild pain (Dont exceed 300 mg in 24 hours from all sources).    Marland Kitchen dexlansoprazole (DEXILANT) 60 MG capsule Take 60 mg by mouth daily.    Marland Kitchen guaiFENesin (MUCINEX) 600 MG 12 hr tablet Take by mouth 2 (two) times daily.    . ondansetron (ZOFRAN) 4 MG tablet Take 4 mg by mouth every 8 (eight) hours as needed for nausea or vomiting.    . potassium chloride (MICRO-K) 10 MEQ CR capsule Take 10 mEq by mouth 2 (two) times daily.     Current Facility-Administered Medications on File Prior to Visit  Medication Dose Route Frequency Provider Last Rate Last Dose  . ceFAZolin (ANCEF) IVPB 1 g/50 mL premix  1 g Intravenous Once Schnier, Dolores Lory, MD       There are no Patient Instructions on file for this visit. No Follow-up on file.  KIMBERLY A STEGMAYER, PA-C

## 2017-11-05 IMAGING — MR MR MRA HEAD W/O CM
11 of 12 series · 39 of 48 positions shown · non-contrast
Comparison: CT head without contrast 07/20/2016

CLINICAL DATA: New onset right-sided weakness beginning 2 days ago.
Symptoms were preceded by dizziness.

EXAM:
MRI HEAD WITHOUT CONTRAST
MRA HEAD WITHOUT CONTRAST
TECHNIQUE: Multiplanar, multiecho pulse sequences of the brain and surrounding
structures were obtained without intravenous contrast. Angiographic
images of the head were obtained using MRA technique without
contrast.

[Series 2: T1 · sagittal · 5.0mm · 0.45mm/px · 2 of 25 slices shown]
[im 1/25]
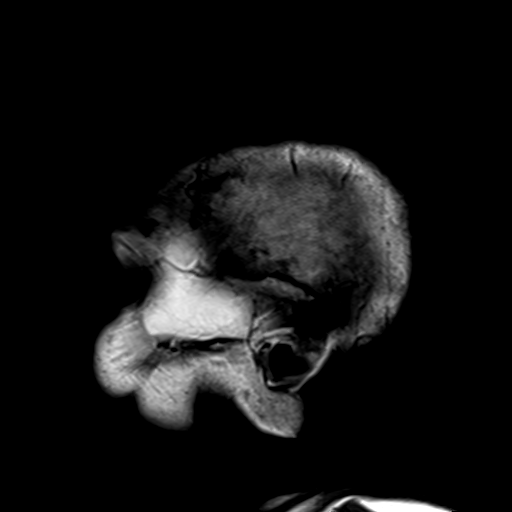
[im 25/25]
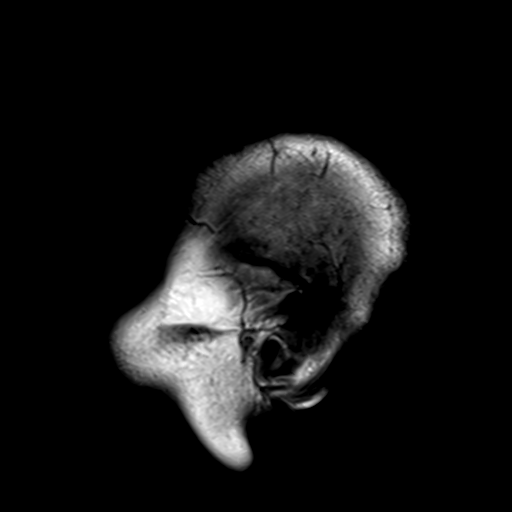

[Series 7: T2 · axial · 5.0mm · 0.45mm/px · z∈[-14,+137]mm · 2 of 25 slices shown (1 of 3)]
[im 1/25]
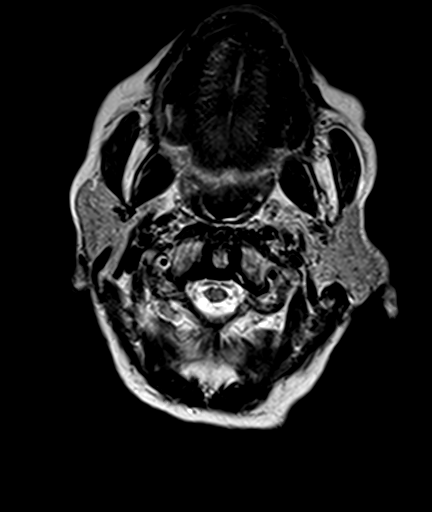
[im 25/25]
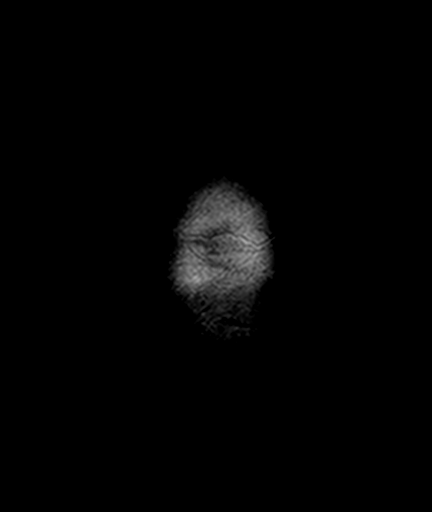

[Series 8: FLAIR · axial · 5.0mm · 0.45mm/px · z∈[-15,+136]mm · 2 of 25 slices shown]
[im 1/25]
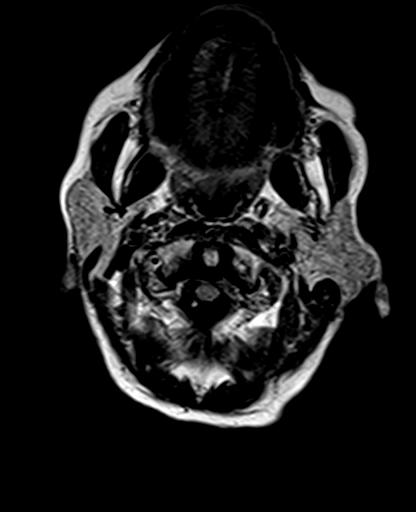
[im 25/25]
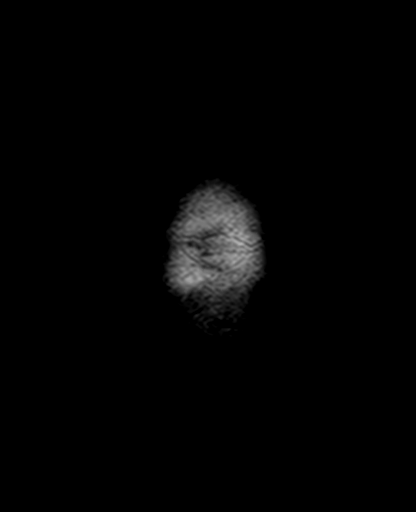

[Series 9: T2 · axial · 5.0mm · 1.20mm/px · z∈[-10,+141]mm · 2 of 25 slices shown (2 of 3)]
[im 1/25]
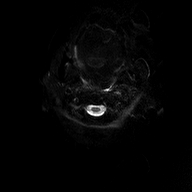
[im 25/25]
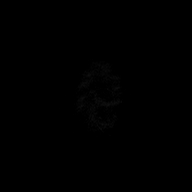

[Series 10: GRE · axial · 5.0mm · 0.45mm/px · z∈[-10,+141]mm · 2 of 25 slices shown]
[im 1/25]
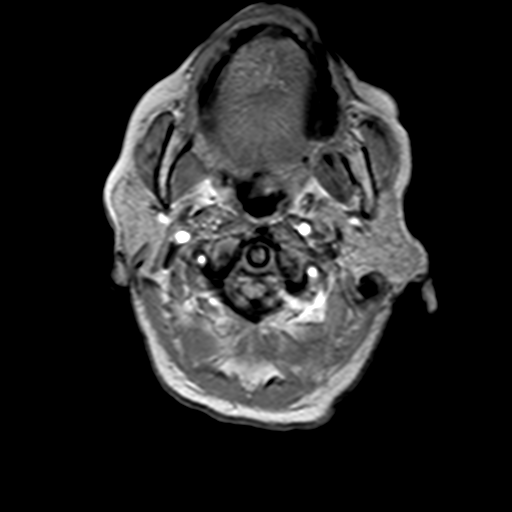
[im 25/25]
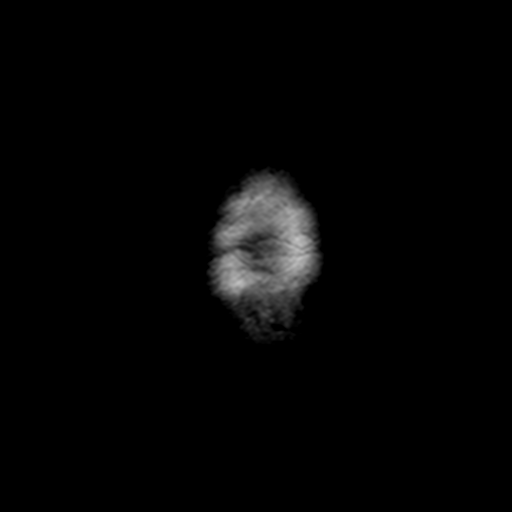

[Series 11: T2 · coronal · 5.0mm · 0.45mm/px · 3 of 27 slices shown (3 of 3)]
[im 1/27]
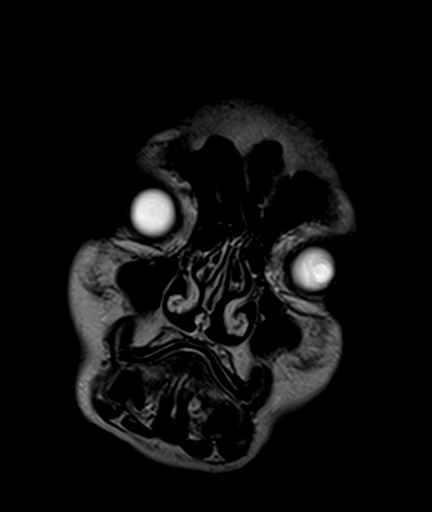
[im 14/27]
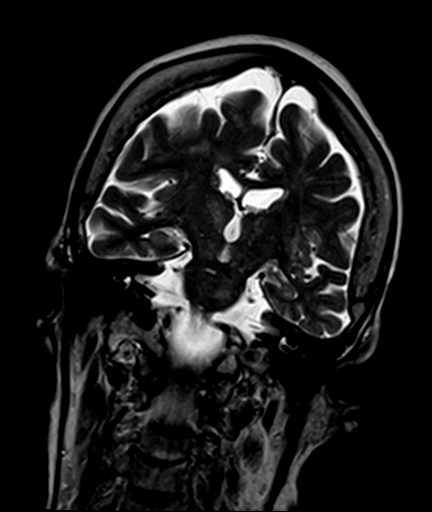
[im 27/27]
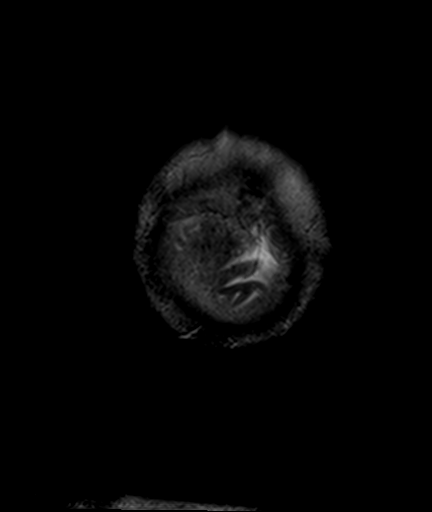

[Series 12: TOF · axial · non-contrast · 0.7mm · 0.37mm/px · z∈[-11,+55]mm · 6 of 143 slices shown]
[im 1/143]
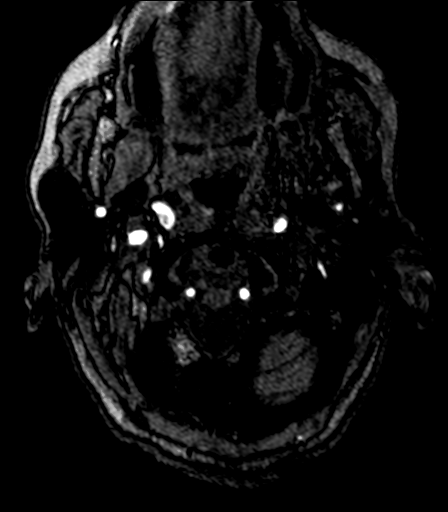
[im 22/143]
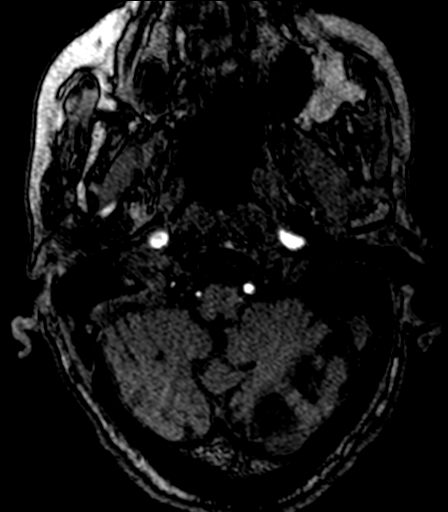
[im 44/143]
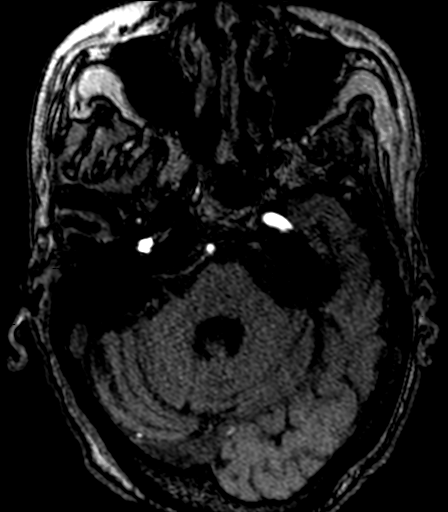
[im 66/143]
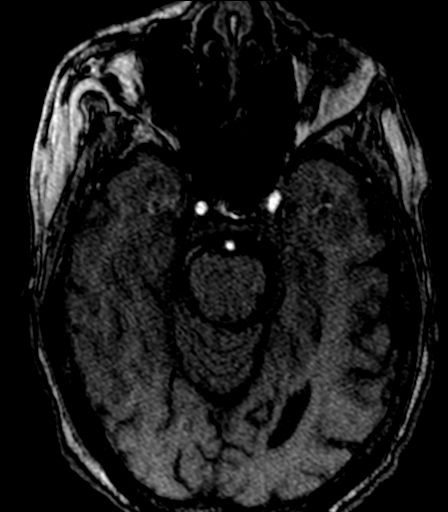
[im 77/143]
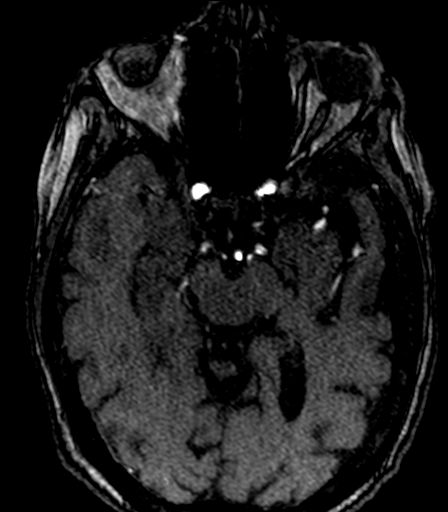
[im 99/143]
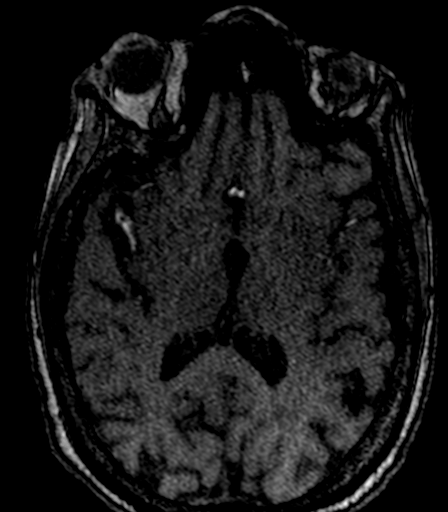

[Series 100: DWI · axial · 3.0mm · 1.80mm/px · z∈[-4,+141]mm · 5 of 50 slices shown (1 of 2)]
[im 1/50]
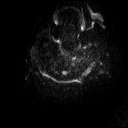
[im 13/50]
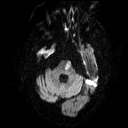
[im 25/50]
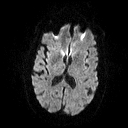
[im 37/50]
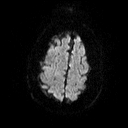
[im 50/50]
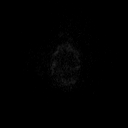

[Series 101: ADC · axial · 3.0mm · 1.80mm/px · z∈[-15,+141]mm · 5 of 53 slices shown (1 of 2)]
[im 1/53]
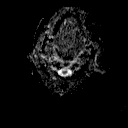
[im 14/53]
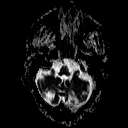
[im 27/53]
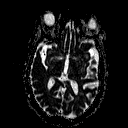
[im 40/53]
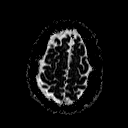
[im 53/53]
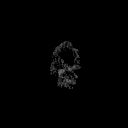

[Series 102: DWI · coronal · 3.0mm · 1.80mm/px · 5 of 49 slices shown (2 of 2)]
[im 1/49]
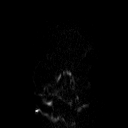
[im 13/49]
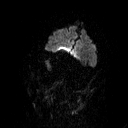
[im 25/49]
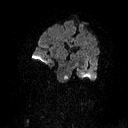
[im 37/49]
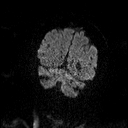
[im 49/49]
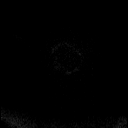

[Series 103: ADC · coronal · 3.0mm · 1.80mm/px · 5 of 49 slices shown (2 of 2)]
[im 1/49]
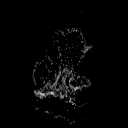
[im 13/49]
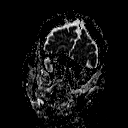
[im 25/49]
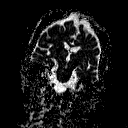
[im 37/49]
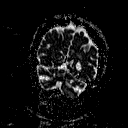
[im 49/49]
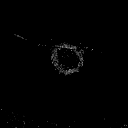

[39 of 48 positions shown; findings below may reference images not displayed]

FINDINGS: MRI HEAD FINDINGS

Brain: The diffusion-weighted images demonstrate acute/ subacute
nonhemorrhagic left pontine infarct measuring 16 mm in maximal
dimension. No acute supratentorial infarct is present. T2 changes
are associated with the infarct compatible with the subacute time
frame. Remote infarcts are present in the inferior cerebellum
bilaterally. Moderate periventricular and subcortical T2 changes are
present bilaterally. A remote lacunar infarct is present in the left
lentiform nucleus. Remote lacunar infarcts are present in the left
thalamus. No acute hemorrhage or mass lesion is present. The
ventricles are of normal size. No significant extra-axial fluid
collection is present.

Vascular: The flow is present in the major intracranial arteries.

Skull and upper cervical spine: The skullbase is within normal
limits. Midline intracranial sagittal structures are normal.
Degenerative changes are present in the cervical spine with central
canal narrowing at C3-4. Marrow signal is normal.

Sinuses/Orbits: Mild mucosal thickening is present along the floor
of the right maxillary sinus. The paranasal sinuses and mastoid air
cells are otherwise clear. Bilateral lens replacements are noted.
The globes and orbits are otherwise intact.

MRA HEAD FINDINGS

Moderate atherosclerotic irregularity is present within the
cavernous internal carotid arteries bilaterally without a
significant stenosis relative to the more distal vessel. There is
mild narrowing of the distal right A1 segment. The left A1 segment
is normal. Atherosclerotic irregularity is present in the M1
segments bilaterally without a significant stenosis. The MCA
bifurcations are intact. There is moderate attenuation of MCA branch
vessels, left greater than right. Next

The left vertebral artery is dominant. Atherosclerotic irregularity
is present in the distal vertebral arteries bilaterally. PICA
origins are visualized and normal. The vertebrobasilar junction is
normal. The left posterior cerebral artery originates from the
basilar tip. The right posterior cerebral artery is of fetal type.
There is moderate attenuation of distal PCA branch vessels
bilaterally.
IMPRESSION: 1. Acute/subacute nonhemorrhagic left pontine infarct.
2. Remote bilateral posterior cerebellar infarcts.
3. Moderate atrophy and white matter disease compatible with chronic
microvascular ischemia.
4. MRA circle of Willis demonstrate significant medium and distal
small vessel disease without a significant proximal stenosis or
aneurysm.
5. Atherosclerotic changes within the cavernous internal carotid
arteries and right greater than left A1 segments without a
significant stenosis.
6. Minimal right maxillary sinus disease.

## 2017-12-01 ENCOUNTER — Ambulatory Visit (INDEPENDENT_AMBULATORY_CARE_PROVIDER_SITE_OTHER): Payer: Medicare Other | Admitting: Vascular Surgery

## 2017-12-01 ENCOUNTER — Encounter (INDEPENDENT_AMBULATORY_CARE_PROVIDER_SITE_OTHER): Payer: Self-pay | Admitting: Vascular Surgery

## 2017-12-01 VITALS — BP 126/60 | HR 67 | Resp 16 | Ht 64.0 in | Wt 189.0 lb

## 2017-12-01 DIAGNOSIS — I83813 Varicose veins of bilateral lower extremities with pain: Secondary | ICD-10-CM

## 2017-12-01 DIAGNOSIS — I739 Peripheral vascular disease, unspecified: Secondary | ICD-10-CM | POA: Diagnosis not present

## 2017-12-01 DIAGNOSIS — I890 Lymphedema, not elsewhere classified: Secondary | ICD-10-CM | POA: Diagnosis not present

## 2017-12-01 NOTE — Progress Notes (Signed)
Subjective:    Patient ID: [name], female    DOB: 09-30-1927, 84 y.o.   MRN: [id number] Chief Complaint  Patient presents with  . Follow-up   Patient presents for a 50-month lymphedema follow-up.  Since our last visit, the patient has been engaging in conservative therapy including wearing medical grade 1 compression socks, elevating her legs and remaining as active as possible.  The patient did receive her lymphedema pump which she has been using twice a day for an hour each time.  Patient seen with aid.  The patient notes an improvement to her lower extremity edema and discomfort.  She is very pleased with this.  Patient denies any claudication-like symptoms, rest pain or ulceration to the bilateral lower extremity.  Patient denies any fever, nausea vomiting.  Review of Systems  Constitutional: Negative.   HENT: Negative.   Eyes: Negative.   Respiratory: Negative.   Cardiovascular: Positive for leg swelling.       Lymphedema  Gastrointestinal: Negative.   Endocrine: Negative.   Genitourinary: Negative.   Musculoskeletal: Negative.   Skin: Negative.   Allergic/Immunologic: Negative.   Neurological: Negative.   Hematological: Negative.   Psychiatric/Behavioral: Negative.       Objective:   Physical Exam  Constitutional: She appears well-developed and well-nourished.  HENT:  Head: Normocephalic and atraumatic.  Right Ear: External ear normal.  Left Ear: External ear normal.  Eyes: Pupils are equal, round, and reactive to light. EOM are normal.  Neck: Normal range of motion.  Cardiovascular: Normal rate, regular rhythm and normal heart sounds.  Pulses:      Radial pulses are 2+ on the right side, and 2+ on the left side.  Hard to palpate pedal pulses however her bilateral feet are warm  Pulmonary/Chest: Effort normal and breath sounds normal.  Musculoskeletal: Normal range of motion. She exhibits edema (Mild bilateral lower extremity edema).  Skin: Skin is warm and  dry.  Psychiatric: She has a normal mood and affect. Her behavior is normal. Judgment and thought content normal.  Vitals reviewed.  BP 126/60 (BP Location: Left Arm)   Pulse 67   LMP  (LMP Unknown)   Past Medical History:  Diagnosis Date  . Abnormal glucose   . Anxiety   . Arthritis   . Asthma   . Calculus of gallbladder without mention of cholecystitis, with obstruction   . Cancer ([place])    kidney  . Candidiasis of the esophagus   . Cataract cortical, senile   . Chronic kidney disease, stage III (moderate) (HCC)   . Chronic pain   . Chronic pain syndrome   . Corns and callosities   . Debility   . Depression   . Dermatophytosis of nail   . Diabetes mellitus without complication ([place])    unspecified, without mention of complication  . Difficult intubation   . Dysphagia   . Dyspnea    with exertion  . Edema, unspecified   . Emphysema lung ([place])   . Emphysema of lung ([place])   . Esophageal reflux   . Essential hypertension, benign   . Generalized atherosclerosis   . Glaucoma (increased eye pressure)   . Gout, unspecified   . Hemiplegia ([place])   . Hereditary and idiopathic peripheral neuropathy   . Hyperglycemia   . Hyperlipidemia   . Hypertension   . Hypoglycemia, unspecified   . Hypopotassemia   . Hypothyroidism   . Migraine, unspecified, without mention of intractable migraine without mention of status migrainosus   .  Other vitamin B12 deficiency anemia   . Pain in limb   . Peripheral vascular disease ([place])   . Personal history of fall   . Stroke ([place])   . Thyroid disease   . Ulcer of foot ([place])   . Unspecified asthma(493.90)   . Unspecified constipation   . Unspecified hypertensive heart disease with heart failure(402.91)   . Unspecified urinary incontinence   . Unspecified vitamin D deficiency    Social History   Socioeconomic History  . Marital status: Single    Spouse name: Not on file  . Number of children: Not on file  . Years of education: Not on  file  . Highest education level: Not on file  Occupational History  . Occupation: retired  Scientific laboratory technician  . Financial resource strain: Not on file  . Food insecurity:    Worry: Not on file    Inability: Not on file  . Transportation needs:    Medical: Not on file    Non-medical: Not on file  Tobacco Use  . Smoking status: Never Smoker  . Smokeless tobacco: Never Used  Substance and Sexual Activity  . Alcohol use: No  . Drug use: No  . Sexual activity: Not Currently  Lifestyle  . Physical activity:    Days per week: Not on file    Minutes per session: Not on file  . Stress: Not on file  Relationships  . Social connections:    Talks on phone: Not on file    Gets together: Not on file    Attends religious service: Not on file    Active member of club or organization: Not on file    Attends meetings of clubs or organizations: Not on file    Relationship status: Not on file  . Intimate partner violence:    Fear of current or ex partner: Not on file    Emotionally abused: Not on file    Physically abused: Not on file    Forced sexual activity: Not on file  Other Topics Concern  . Not on file  Social History Narrative  . Not on file   Past Surgical History:  Procedure Laterality Date  . ABDOMINAL HYSTERECTOMY    . arterial thrombi stents in both legs    . BACK SURGERY    . BACK SURGERY    . CATARACT EXTRACTION    . COLONOSCOPY    . ESOPHAGOGASTRODUODENOSCOPY (EGD) WITH PROPOFOL N/A 06/19/2016   Procedure: ESOPHAGOGASTRODUODENOSCOPY (EGD) WITH PROPOFOL;  Surgeon: Lollie Sails, MD;  Location: [facility] ENDOSCOPY;  Service: Endoscopy;  Laterality: N/A;  . ESOPHAGOGASTRODUODENOSCOPY (EGD) WITH PROPOFOL N/A 04/10/2017   Procedure: ESOPHAGOGASTRODUODENOSCOPY (EGD) WITH PROPOFOL;  Surgeon: Lollie Sails, MD;  Location: [facility] ENDOSCOPY;  Service: Endoscopy;  Laterality: N/A;  . EYE SURGERY     cataract extraction  . LOWER EXTREMITY ANGIOGRAPHY Right 10/07/2016   Procedure:  Lower Extremity Angiography;  Surgeon: Katha Cabal, MD;  Location: [facility];  Service: Cardiovascular;  Laterality: Right;  . PERIPHERAL VASCULAR CATHETERIZATION Left 09/02/2016   Procedure: Lower Extremity Angiography;  Surgeon: Katha Cabal, MD;  Location: [facility];  Service: Cardiovascular;  Laterality: Left;  . PERIPHERAL VASCULAR CATHETERIZATION Right 09/16/2016   Procedure: Lower Extremity Angiography;  Surgeon: Katha Cabal, MD;  Location: [facility];  Service: Cardiovascular;  Laterality: Right;   Family History  Problem Relation Age of Onset  . Cancer Father   . Stroke Father   .  Seizures Mother   . Lung cancer Sister   . Bone cancer Sister    No Known Allergies     Assessment & Plan:  Patient presents for a 89-month lymphedema follow-up.  Since our last visit, the patient has been engaging in conservative therapy including wearing medical grade 1 compression socks, elevating her legs and remaining as active as possible.  The patient did receive her lymphedema pump which she has been using twice a day for an hour each time.  Patient seen with aid.  The patient notes an improvement to her lower extremity edema and discomfort.  She is very pleased with this.  Patient denies any claudication-like symptoms, rest pain or ulceration to the bilateral lower extremity.  Patient denies any fever, nausea vomiting.  1. Varicose veins of both lower extremities with pain - Stable The patient's lower extremity edema and discomfort has improved now with the addition of a lymphedema pump The patient continues to engage in conservative therapy and now uses a lymphedema pump twice a day for an hour each time The patient notes a improvement to her lower extremity edema and the discomfort that was associated with it The patient is to continue wearing her compression socks, elevating her legs and using her lymphedema pump twice a day for an hour each  time The patient was instructed to call the office in the interim if any worsening edema or ulcerations to the legs, feet or toes occurs.  2. PVD (peripheral vascular disease) ([place]) - Stable Patient presents asymptomatically today Patient to follow-up on a yearly basis for surveillance ABI I have discussed with the patient at length the risk factors for and pathogenesis of atherosclerotic disease and encouraged a healthy diet, regular exercise regimen and blood pressure / glucose control.  The patient was encouraged to call the office in the interim if he experiences any claudication like symptoms, rest pain or ulcers to his feet / toes.  - VAS [place] ABI WITH/WO TBI; Future  3. Lymphedema - Stable As above  Current Outpatient Medications on File Prior to Visit  Medication Sig Dispense Refill  . allopurinol (ZYLOPRIM) 100 MG tablet Take 100 mg by mouth daily.    Marland Kitchen amitriptyline (ELAVIL) 10 MG tablet Take 10 mg by mouth at bedtime.    Marland Kitchen aspirin EC 81 MG EC tablet Take 1 tablet (81 mg total) by mouth daily. 30 tablet 0  . atorvastatin (LIPITOR) 40 MG tablet Take 1 tablet (40 mg total) by mouth daily at 6 PM. 30 tablet 1  . clopidogrel (PLAVIX) 75 MG tablet Take 75 mg by mouth daily with breakfast.    . DULoxetine (CYMBALTA) 30 MG capsule Take 30 mg by mouth every morning.    . furosemide (LASIX) 20 MG tablet Take 1 tablet (20 mg total) by mouth every other day. 30 tablet 0  . gabapentin (NEURONTIN) 400 MG capsule     . gabapentin (NEURONTIN) 600 MG tablet     . latanoprost (XALATAN) 0.005 % ophthalmic solution Place 1 drop into both eyes at bedtime.    Marland Kitchen levothyroxine (SYNTHROID, LEVOTHROID) 25 MCG tablet Take 25 mcg by mouth daily before breakfast.    . metoprolol tartrate (LOPRESSOR) 25 MG tablet Take 12.5 mg by mouth 2 (two) times daily.    Marland Kitchen acetaminophen (TYLENOL) 325 MG tablet Take 650 mg by mouth every 4 (four) hours as needed for mild pain (Dont exceed 300 mg in 24 hours from all  sources).    Marland Kitchen  Cholecalciferol (VITAMIN D-3) 1000 UNITS CAPS Take 2,000 Units by mouth daily.     Marland Kitchen dexlansoprazole (DEXILANT) 60 MG capsule Take 60 mg by mouth daily.    Marland Kitchen guaiFENesin (MUCINEX) 600 MG 12 hr tablet Take by mouth 2 (two) times daily.    . ondansetron (ZOFRAN) 4 MG tablet Take 4 mg by mouth every 8 (eight) hours as needed for nausea or vomiting.    . pantoprazole (PROTONIX) 40 MG tablet Take 40 mg by mouth 2 (two) times daily.     . potassium chloride (MICRO-K) 10 MEQ CR capsule Take 10 mEq by mouth 2 (two) times daily.    . Probiotic Product (ALIGN) 4 MG CAPS Take 4 mg by mouth daily.    Marland Kitchen senna (SENOKOT) 8.6 MG tablet Take 1 tablet by mouth 3 (three) times daily as needed for constipation.    . topiramate (TOPAMAX) 25 MG tablet Take 25 mg by mouth at bedtime.    . traMADol (ULTRAM) 50 MG tablet Take 50 mg by mouth every 8 (eight) hours.     . vitamin B-12 (CYANOCOBALAMIN) 1000 MCG tablet Take 1,000 mcg by mouth daily.     Current Facility-Administered Medications on File Prior to Visit  Medication Dose Route Frequency Provider Last Rate Last Dose  . ceFAZolin (ANCEF) IVPB 1 g/50 mL premix  1 g Intravenous Once Schnier, Dolores Lory, MD       There are no Patient Instructions on file for this visit. No follow-ups on file.  Christal Lagerstrom A Timara [place], PA-C

## 2017-12-09 ENCOUNTER — Other Ambulatory Visit: Payer: Self-pay

## 2017-12-09 ENCOUNTER — Encounter: Payer: Self-pay | Admitting: Emergency Medicine

## 2017-12-09 ENCOUNTER — Emergency Department: Payer: Medicare Other

## 2017-12-09 ENCOUNTER — Observation Stay
Admission: EM | Admit: 2017-12-09 | Discharge: 2017-12-11 | Disposition: A | Discharge status: Home / Self Care | Payer: Medicare Other | Attending: Specialist | Admitting: Specialist

## 2017-12-09 DIAGNOSIS — Z7982 Long term (current) use of aspirin: Secondary | ICD-10-CM | POA: Diagnosis not present

## 2017-12-09 DIAGNOSIS — M109 Gout, unspecified: Secondary | ICD-10-CM | POA: Diagnosis not present

## 2017-12-09 DIAGNOSIS — K922 Gastrointestinal hemorrhage, unspecified: Secondary | ICD-10-CM | POA: Diagnosis present

## 2017-12-09 DIAGNOSIS — Z7902 Long term (current) use of antithrombotics/antiplatelets: Secondary | ICD-10-CM | POA: Diagnosis not present

## 2017-12-09 DIAGNOSIS — E785 Hyperlipidemia, unspecified: Secondary | ICD-10-CM | POA: Insufficient documentation

## 2017-12-09 DIAGNOSIS — Z85528 Personal history of other malignant neoplasm of kidney: Secondary | ICD-10-CM | POA: Insufficient documentation

## 2017-12-09 DIAGNOSIS — I509 Heart failure, unspecified: Secondary | ICD-10-CM | POA: Diagnosis not present

## 2017-12-09 DIAGNOSIS — F419 Anxiety disorder, unspecified: Secondary | ICD-10-CM | POA: Insufficient documentation

## 2017-12-09 DIAGNOSIS — Z8673 Personal history of transient ischemic attack (TIA), and cerebral infarction without residual deficits: Secondary | ICD-10-CM | POA: Diagnosis not present

## 2017-12-09 DIAGNOSIS — J439 Emphysema, unspecified: Secondary | ICD-10-CM | POA: Insufficient documentation

## 2017-12-09 DIAGNOSIS — N179 Acute kidney failure, unspecified: Secondary | ICD-10-CM | POA: Insufficient documentation

## 2017-12-09 DIAGNOSIS — Z66 Do not resuscitate: Secondary | ICD-10-CM | POA: Diagnosis not present

## 2017-12-09 DIAGNOSIS — F329 Major depressive disorder, single episode, unspecified: Secondary | ICD-10-CM | POA: Diagnosis not present

## 2017-12-09 DIAGNOSIS — Z79899 Other long term (current) drug therapy: Secondary | ICD-10-CM | POA: Insufficient documentation

## 2017-12-09 DIAGNOSIS — K219 Gastro-esophageal reflux disease without esophagitis: Secondary | ICD-10-CM | POA: Insufficient documentation

## 2017-12-09 DIAGNOSIS — I130 Hypertensive heart and chronic kidney disease with heart failure and stage 1 through stage 4 chronic kidney disease, or unspecified chronic kidney disease: Secondary | ICD-10-CM | POA: Insufficient documentation

## 2017-12-09 DIAGNOSIS — D519 Vitamin B12 deficiency anemia, unspecified: Secondary | ICD-10-CM | POA: Insufficient documentation

## 2017-12-09 DIAGNOSIS — K625 Hemorrhage of anus and rectum: Principal | ICD-10-CM | POA: Insufficient documentation

## 2017-12-09 DIAGNOSIS — H409 Unspecified glaucoma: Secondary | ICD-10-CM | POA: Diagnosis not present

## 2017-12-09 DIAGNOSIS — N183 Chronic kidney disease, stage 3 (moderate): Secondary | ICD-10-CM | POA: Diagnosis not present

## 2017-12-09 DIAGNOSIS — G43909 Migraine, unspecified, not intractable, without status migrainosus: Secondary | ICD-10-CM | POA: Insufficient documentation

## 2017-12-09 DIAGNOSIS — E559 Vitamin D deficiency, unspecified: Secondary | ICD-10-CM | POA: Insufficient documentation

## 2017-12-09 DIAGNOSIS — E1122 Type 2 diabetes mellitus with diabetic chronic kidney disease: Secondary | ICD-10-CM | POA: Insufficient documentation

## 2017-12-09 DIAGNOSIS — E1151 Type 2 diabetes mellitus with diabetic peripheral angiopathy without gangrene: Secondary | ICD-10-CM | POA: Diagnosis not present

## 2017-12-09 DIAGNOSIS — E039 Hypothyroidism, unspecified: Secondary | ICD-10-CM | POA: Diagnosis not present

## 2017-12-09 DIAGNOSIS — E1140 Type 2 diabetes mellitus with diabetic neuropathy, unspecified: Secondary | ICD-10-CM | POA: Diagnosis not present

## 2017-12-09 LAB — COMPREHENSIVE METABOLIC PANEL
ALT: 14 U/L (ref 14–54)
ANION GAP: 6 (ref 5–15)
AST: 33 U/L (ref 15–41)
Albumin: 3.6 g/dL (ref 3.5–5.0)
Alkaline Phosphatase: 266 U/L — ABNORMAL HIGH (ref 38–126)
BUN: 27 mg/dL — ABNORMAL HIGH (ref 6–20)
CO2: 25 mmol/L (ref 22–32)
CREATININE: 1.59 mg/dL — AB (ref 0.44–1.00)
Calcium: 9 mg/dL (ref 8.9–10.3)
Chloride: 107 mmol/L (ref 101–111)
GFR, EST AFRICAN AMERICAN: 32 mL/min — AB (ref >60)
GFR, EST NON AFRICAN AMERICAN: 28 mL/min — AB (ref >60)
Glucose, Bld: 91 mg/dL (ref 65–99)
POTASSIUM: 4.4 mmol/L (ref 3.5–5.1)
SODIUM: 138 mmol/L (ref 135–145)
Total Bilirubin: 0.5 mg/dL (ref 0.3–1.2)
Total Protein: 8.2 g/dL — ABNORMAL HIGH (ref 6.5–8.1)

## 2017-12-09 LAB — CBC
HCT: 34.4 % — ABNORMAL LOW (ref 35.0–47.0)
HEMOGLOBIN: 11.2 g/dL — AB (ref 12.0–16.0)
MCH: 30.5 pg (ref 26.0–34.0)
MCHC: 32.7 g/dL (ref 32.0–36.0)
MCV: 93.5 fL (ref 80.0–100.0)
PLATELETS: 246 10*3/uL (ref 150–440)
RBC: 3.68 MIL/uL — AB (ref 3.80–5.20)
RDW: 17.4 % — ABNORMAL HIGH (ref 11.5–14.5)
WBC: 7 10*3/uL (ref 3.6–11.0)

## 2017-12-09 LAB — TYPE AND SCREEN
ABO/RH(D): O POS
ANTIBODY SCREEN: NEGATIVE

## 2017-12-09 LAB — DIFFERENTIAL
Basophils Absolute: 0 10*3/uL (ref 0–0.1)
Basophils Relative: 0 %
Eosinophils Absolute: 0.3 10*3/uL (ref 0–0.7)
Eosinophils Relative: 4 %
LYMPHS ABS: 1.8 10*3/uL (ref 1.0–3.6)
LYMPHS PCT: 25 %
Monocytes Absolute: 0.8 10*3/uL (ref 0.2–0.9)
Monocytes Relative: 11 %
NEUTROS ABS: 4.2 10*3/uL (ref 1.4–6.5)
Neutrophils Relative %: 60 %

## 2017-12-09 LAB — GLUCOSE, CAPILLARY
GLUCOSE-CAPILLARY: 22 mg/dL — AB (ref 65–99)
GLUCOSE-CAPILLARY: 148 mg/dL — AB (ref 65–99)
GLUCOSE-CAPILLARY: 78 mg/dL (ref 65–99)
Glucose-Capillary: 11 mg/dL — CL (ref 65–99)

## 2017-12-09 LAB — GLUCOSE, RANDOM: Glucose, Bld: 72 mg/dL (ref 65–99)

## 2017-12-09 LAB — HEMOGLOBIN: HEMOGLOBIN: 11.7 g/dL — AB (ref 12.0–16.0)

## 2017-12-09 MED ORDER — DULOXETINE HCL 30 MG PO CPEP
30.0000 mg | ORAL_CAPSULE | ORAL | Status: DC
  Administered 2017-12-11: 30 mg via ORAL
  Filled 2017-12-09: qty 1

## 2017-12-09 MED ORDER — LATANOPROST 0.005 % OP SOLN
1.0000 [drp] | Freq: Every day | OPHTHALMIC | Status: DC
  Administered 2017-12-09 – 2017-12-10 (×2): 1 [drp] via OPHTHALMIC
  Filled 2017-12-09: qty 2.5

## 2017-12-09 MED ORDER — ONDANSETRON HCL 4 MG PO TABS: 4.0000 mg | ORAL_TABLET | Freq: Four times a day (QID) | ORAL | Status: DC | PRN

## 2017-12-09 MED ORDER — ACETAMINOPHEN 500 MG PO TABS
ORAL_TABLET | ORAL | Status: AC
  Administered 2017-12-09: 1000 mg via ORAL
  Filled 2017-12-09: qty 2

## 2017-12-09 MED ORDER — GABAPENTIN 600 MG PO TABS
900.0000 mg | ORAL_TABLET | Freq: Every day | ORAL | Status: DC
  Administered 2017-12-09 – 2017-12-10 (×2): 900 mg via ORAL
  Filled 2017-12-09 (×2): qty 2

## 2017-12-09 MED ORDER — SENNOSIDES 8.6 MG PO TABS: 1.0000 | ORAL_TABLET | Freq: Three times a day (TID) | ORAL | Status: DC | PRN

## 2017-12-09 MED ORDER — SENNOSIDES-DOCUSATE SODIUM 8.6-50 MG PO TABS: 1.0000 | ORAL_TABLET | Freq: Every evening | ORAL | Status: DC | PRN

## 2017-12-09 MED ORDER — ALBUTEROL SULFATE (2.5 MG/3ML) 0.083% IN NEBU: 2.5000 mg | INHALATION_SOLUTION | RESPIRATORY_TRACT | Status: DC | PRN

## 2017-12-09 MED ORDER — ACETAMINOPHEN 500 MG PO TABS
1000.0000 mg | ORAL_TABLET | Freq: Once | ORAL | Status: AC
  Administered 2017-12-09: 1000 mg via ORAL

## 2017-12-09 MED ORDER — SODIUM CHLORIDE 0.9 % IV SOLN
INTRAVENOUS | Status: DC
  Administered 2017-12-09 – 2017-12-10 (×2): via INTRAVENOUS

## 2017-12-09 MED ORDER — ACETAMINOPHEN 325 MG PO TABS: 650.0000 mg | ORAL_TABLET | Freq: Four times a day (QID) | ORAL | Status: DC | PRN

## 2017-12-09 MED ORDER — AMITRIPTYLINE HCL 25 MG PO TABS
12.5000 mg | ORAL_TABLET | Freq: Two times a day (BID) | ORAL | Status: DC
  Administered 2017-12-09 – 2017-12-11 (×4): 12.5 mg via ORAL
  Filled 2017-12-09 (×5): qty 0.5

## 2017-12-09 MED ORDER — INSULIN ASPART 100 UNIT/ML ~~LOC~~ SOLN: 0.0000 [IU] | Freq: Every day | SUBCUTANEOUS | Status: DC

## 2017-12-09 MED ORDER — DEXTROSE 50 % IV SOLN
25.0000 mL | INTRAVENOUS | Status: AC
  Administered 2017-12-09: 25 mL via INTRAVENOUS
  Filled 2017-12-09: qty 50

## 2017-12-09 MED ORDER — INSULIN ASPART 100 UNIT/ML ~~LOC~~ SOLN
0.0000 [IU] | Freq: Three times a day (TID) | SUBCUTANEOUS | Status: DC
Start: 2017-12-09 — End: 2017-12-10

## 2017-12-09 MED ORDER — ACETAMINOPHEN 650 MG RE SUPP: 650.0000 mg | Freq: Four times a day (QID) | RECTAL | Status: DC | PRN

## 2017-12-09 MED ORDER — ALLOPURINOL 100 MG PO TABS
100.0000 mg | ORAL_TABLET | Freq: Every day | ORAL | Status: DC
  Administered 2017-12-09 – 2017-12-11 (×3): 100 mg via ORAL
  Filled 2017-12-09 (×3): qty 1

## 2017-12-09 MED ORDER — BISACODYL 5 MG PO TBEC: 5.0000 mg | DELAYED_RELEASE_TABLET | Freq: Every day | ORAL | Status: DC | PRN

## 2017-12-09 MED ORDER — TOPIRAMATE 25 MG PO TABS
25.0000 mg | ORAL_TABLET | Freq: Every day | ORAL | Status: DC
  Administered 2017-12-09 – 2017-12-10 (×2): 25 mg via ORAL
  Filled 2017-12-09 (×3): qty 1

## 2017-12-09 MED ORDER — GABAPENTIN 400 MG PO CAPS
400.0000 mg | ORAL_CAPSULE | Freq: Two times a day (BID) | ORAL | Status: DC
  Administered 2017-12-09 – 2017-12-11 (×2): 400 mg via ORAL
  Filled 2017-12-09 (×2): qty 1

## 2017-12-09 MED ORDER — METOPROLOL TARTRATE 25 MG PO TABS
12.5000 mg | ORAL_TABLET | Freq: Two times a day (BID) | ORAL | Status: DC
  Administered 2017-12-09 – 2017-12-11 (×3): 12.5 mg via ORAL
  Filled 2017-12-09 (×3): qty 1

## 2017-12-09 MED ORDER — ONDANSETRON HCL 4 MG/2ML IJ SOLN: 4.0000 mg | Freq: Four times a day (QID) | INTRAMUSCULAR | Status: DC | PRN

## 2017-12-09 MED ORDER — PANTOPRAZOLE SODIUM 40 MG PO TBEC
40.0000 mg | DELAYED_RELEASE_TABLET | Freq: Two times a day (BID) | ORAL | Status: DC
  Administered 2017-12-09 – 2017-12-11 (×4): 40 mg via ORAL
  Filled 2017-12-09 (×4): qty 1

## 2017-12-09 MED ORDER — TRAMADOL HCL 50 MG PO TABS: 50.0000 mg | ORAL_TABLET | Freq: Four times a day (QID) | ORAL | Status: DC | PRN

## 2017-12-09 MED ORDER — HYDROCODONE-ACETAMINOPHEN 5-325 MG PO TABS
1.0000 | ORAL_TABLET | ORAL | Status: DC | PRN
  Administered 2017-12-10: 2 via ORAL
  Administered 2017-12-10 (×2): 1 via ORAL
  Filled 2017-12-09 (×2): qty 1
  Filled 2017-12-09: qty 2

## 2017-12-09 MED ORDER — LEVOTHYROXINE SODIUM 50 MCG PO TABS
25.0000 ug | ORAL_TABLET | Freq: Every day | ORAL | Status: DC
  Administered 2017-12-11: 25 ug via ORAL
  Filled 2017-12-09: qty 1

## 2017-12-09 NOTE — Progress Notes (Signed)
Advanced Care Plan.  Purpose of Encounter: CODE STATUS Parties in Attendance: The patient, her son, her daughter-in-law in the me. Patient's Decisional Capacity: Yes. Medical Story: [name]  is a 84 y.o. female with a known history of multiple medical problems including CVA, asthma, arthritis, kidney cancer, CKD stage III, chronic pain syndrome, hypertension, diabetes, emphysema, GERD, hyperlipidemia, gout etc. the patient came to ED due to rectal bleeding.  She is being admitted for GI bleeding.  Discussed with the patient about her condition, prognosis and CODE STATUS.  The patient clearly stated that she wants DNR.  Plan:  Code Status: DNR. Time spent discussing advance care planning: 17 minutes.

## 2017-12-09 NOTE — ED Provider Notes (Signed)
[facility] Emergency Department Provider Note   ____________________________________________   First MD Initiated Contact with Patient 12/09/17 1130     (approximate)  I have reviewed the triage vital signs and the nursing notes.   HISTORY  Chief Complaint Rectal Bleeding    HPI [name] is a 84 y.o. female who is living at [facility].  Patient noticed to have rectal bleeding since Monday.  She is reportedly getting weaker and not walking as much.  She says she feels weak.  She is taking aspirin and Plavix.  She had a stroke in the past.   Past Medical History:  Diagnosis Date  . Abnormal glucose   . Anxiety   . Arthritis   . Asthma   . Calculus of gallbladder without mention of cholecystitis, with obstruction   . Cancer ([place])    kidney  . Candidiasis of the esophagus   . Cataract cortical, senile   . Chronic kidney disease, stage III (moderate) (HCC)   . Chronic pain   . Chronic pain syndrome   . Corns and callosities   . Debility   . Depression   . Dermatophytosis of nail   . Diabetes mellitus without complication ([place])    unspecified, without mention of complication  . Difficult intubation   . Dysphagia   . Dyspnea    with exertion  . Edema, unspecified   . Emphysema lung ([place])   . Emphysema of lung ([place])   . Esophageal reflux   . Essential hypertension, benign   . Generalized atherosclerosis   . Glaucoma (increased eye pressure)   . Gout, unspecified   . Hemiplegia ([place])   . Hereditary and idiopathic peripheral neuropathy   . Hyperglycemia   . Hyperlipidemia   . Hypertension   . Hypoglycemia, unspecified   . Hypopotassemia   . Hypothyroidism   . Migraine, unspecified, without mention of intractable migraine without mention of status migrainosus   . Other vitamin B12 deficiency anemia   . Pain in limb   . Peripheral vascular disease ([place])   . Personal history of fall   . Stroke ([place])   . Thyroid disease   .  Ulcer of foot ([place])   . Unspecified asthma(493.90)   . Unspecified constipation   . Unspecified hypertensive heart disease with heart failure(402.91)   . Unspecified urinary incontinence   . Unspecified vitamin D deficiency     Patient Active Problem List   Diagnosis Date Noted  . Chronic venous insufficiency 09/02/2017  . Lymphedema 09/02/2017  . Varicose veins of both lower extremities with pain 07/29/2017  . Hyperlipidemia 05/04/2017  . Hypothyroidism 05/04/2017  . Acute CVA (cerebrovascular accident) ([place]) 05/04/2017  . Gout 10/27/2016  . CVA (cerebral vascular accident) ([place]) 07/20/2016  . Essential hypertension 07/14/2016  . Atherosclerosis of native arteries of the extremities with gangrene ([place]) 07/14/2016  . PVD (peripheral vascular disease) ([place]) 07/14/2016    Past Surgical History:  Procedure Laterality Date  . ABDOMINAL HYSTERECTOMY    . arterial thrombi stents in both legs    . BACK SURGERY    . BACK SURGERY    . CATARACT EXTRACTION    . COLONOSCOPY    . ESOPHAGOGASTRODUODENOSCOPY (EGD) WITH PROPOFOL N/A 06/19/2016   Procedure: ESOPHAGOGASTRODUODENOSCOPY (EGD) WITH PROPOFOL;  Surgeon: Lollie Sails, MD;  Location: [facility] ENDOSCOPY;  Service: Endoscopy;  Laterality: N/A;  . ESOPHAGOGASTRODUODENOSCOPY (EGD) WITH PROPOFOL N/A 04/10/2017   Procedure: ESOPHAGOGASTRODUODENOSCOPY (EGD) WITH PROPOFOL;  Surgeon: Loistine Simas  U, MD;  Location: [facility] ENDOSCOPY;  Service: Endoscopy;  Laterality: N/A;  . EYE SURGERY     cataract extraction  . LOWER EXTREMITY ANGIOGRAPHY Right 10/07/2016   Procedure: Lower Extremity Angiography;  Surgeon: Katha Cabal, MD;  Location: [place] CV LAB;  Service: Cardiovascular;  Laterality: Right;  . PERIPHERAL VASCULAR CATHETERIZATION Left 09/02/2016   Procedure: Lower Extremity Angiography;  Surgeon: Katha Cabal, MD;  Location: [place] CV LAB;  Service: Cardiovascular;  Laterality: Left;  . PERIPHERAL VASCULAR  CATHETERIZATION Right 09/16/2016   Procedure: Lower Extremity Angiography;  Surgeon: Katha Cabal, MD;  Location: [place] [facility] LAB;  Service: Cardiovascular;  Laterality: Right;    Prior to Admission medications   Medication Sig Start Date End Date Taking? Authorizing Provider  acetaminophen (TYLENOL) 325 MG tablet Take 650 mg by mouth every 4 (four) hours as needed for mild pain (Dont exceed 300 mg in 24 hours from all sources).    [provider]  allopurinol (ZYLOPRIM) 100 MG tablet Take 100 mg by mouth daily.    [provider]  amitriptyline (ELAVIL) 10 MG tablet Take 10 mg by mouth at bedtime.    [provider]  aspirin EC 81 MG EC tablet Take 1 tablet (81 mg total) by mouth daily. 07/22/16   Fritzi Mandes, MD  atorvastatin (LIPITOR) 40 MG tablet Take 1 tablet (40 mg total) by mouth daily at 6 PM. 07/22/16   Fritzi Mandes, MD  Cholecalciferol (VITAMIN D-3) 1000 UNITS CAPS Take 2,000 Units by mouth daily.     [provider]  clopidogrel (PLAVIX) 75 MG tablet Take 75 mg by mouth daily with breakfast.    [provider]  dexlansoprazole (DEXILANT) 60 MG capsule Take 60 mg by mouth daily.    [provider]  DULoxetine (CYMBALTA) 30 MG capsule Take 30 mg by mouth every morning.    [provider]  furosemide (LASIX) 20 MG tablet Take 1 tablet (20 mg total) by mouth every other day. 07/22/16   Fritzi Mandes, MD  gabapentin (NEURONTIN) 400 MG capsule  11/28/17   [provider]  gabapentin (NEURONTIN) 600 MG tablet  07/11/17   [provider]  guaiFENesin (MUCINEX) 600 MG 12 hr tablet Take by mouth 2 (two) times daily.    [provider]  latanoprost (XALATAN) 0.005 % ophthalmic solution Place 1 drop into both eyes at bedtime.    [provider]  levothyroxine (SYNTHROID, LEVOTHROID) 25 MCG tablet Take 25 mcg by mouth daily before breakfast.    [provider]  metoprolol tartrate  (LOPRESSOR) 25 MG tablet Take 12.5 mg by mouth 2 (two) times daily.    [provider]  ondansetron (ZOFRAN) 4 MG tablet Take 4 mg by mouth every 8 (eight) hours as needed for nausea or vomiting.    [provider]  pantoprazole (PROTONIX) 40 MG tablet Take 40 mg by mouth 2 (two) times daily.     [provider]  potassium chloride (MICRO-K) 10 MEQ CR capsule Take 10 mEq by mouth 2 (two) times daily.    [provider]  Probiotic Product (ALIGN) 4 MG CAPS Take 4 mg by mouth daily.    [provider]  senna (SENOKOT) 8.6 MG tablet Take 1 tablet by mouth 3 (three) times daily as needed for constipation.    [provider]  topiramate (TOPAMAX) 25 MG tablet Take 25 mg by mouth at bedtime.    [provider]  traMADol (ULTRAM) 50 MG tablet Take 50 mg by mouth every 8 (eight) hours.     [provider]  vitamin B-12 (CYANOCOBALAMIN) 1000 MCG tablet Take 1,000 mcg by mouth daily.    [provider]    Allergies Patient has no known allergies.  Family History  Problem Relation Age of Onset  . Cancer Father   . Stroke Father   . Seizures Mother   . Lung cancer Sister   . Bone cancer Sister     Social History Social History   Tobacco Use  . Smoking status: Never Smoker  . Smokeless tobacco: Never Used  Substance Use Topics  . Alcohol use: No  . Drug use: No    Review of Systems  Constitutional: No fever/chills Eyes: No visual changes. ENT: No sore throat. Cardiovascular: Denies chest pain. Respiratory: Denies shortness of breath. Gastrointestinal: No abdominal pain.  No nausea, no vomiting.  No diarrhea.  No constipation. Genitourinary: Negative for dysuria. Musculoskeletal: Negative for back pain. Skin: Negative for rash. Neurological: Negative for headaches, new focal weakness   ____________________________________________   PHYSICAL EXAM:  VITAL SIGNS: ED Triage Vitals  Enc Vitals Group      BP 12/09/17 1125 (!) 157/50     Pulse Rate 12/09/17 1125 66     Resp 12/09/17 1125 16     Temp 12/09/17 1128 98 F (36.7 C)     Temp Source 12/09/17 1128 Oral     SpO2 12/09/17 1125 100 %     Weight --      Height --      Head Circumference --      Peak Flow --      Pain Score 12/09/17 1130 8     Pain Loc --      Pain Edu? --      Excl. in [place]? --     Constitutional: Alert and oriented. Well appearing and in no acute distress. Eyes: Conjunctivae are normal.  Head: Atraumatic. Nose: No congestion/rhinnorhea. Mouth/Throat: Mucous membranes are moist.  Oropharynx non-erythematous. Neck: No stridor. Cardiovascular: Normal rate, regular rhythm. Grossly normal heart sounds.  Good peripheral circulation. Respiratory: Normal respiratory effort.  No retractions. Lungs CTAB. Gastrointestinal: Soft and nontender. No distention. No abdominal bruits. No CVA tenderness. Rectal: No masses no hemorrhoids gross dark red blood next with stool in the rectum musculoskeletal: No lower extremity tenderness  edema.  Patient complains of pain in the left shoulder and upper arm.  There are some bruises here which are tender and firm.  We will get an x-ray just to make sure there is nothing else going on there. Neurologic:  Normal speech and language. No new gross focal neurologic deficits are appreciated. Skin:  Skin is warm, dry and intact. No rash noted. Psychiatric: Mood and affect are normal. Speech and behavior are normal.  ____________________________________________   LABS (all labs ordered are listed, but only abnormal results are displayed)  Labs Reviewed  COMPREHENSIVE METABOLIC PANEL - Abnormal; Notable for the following components:      Result Value   BUN 27 (*)    Creatinine, Ser 1.59 (*)    Total Protein 8.2 (*)    Alkaline Phosphatase 266 (*)    GFR calc non Af Amer 28 (*)    GFR calc Af Amer 32 (*)    All other components within normal limits  CBC - Abnormal; Notable for the  following components:   RBC 3.68 (*)    Hemoglobin 11.2 (*)  HCT 34.4 (*)    RDW 17.4 (*)    All other components within normal limits  DIFFERENTIAL  CBC WITH DIFFERENTIAL/PLATELET  TYPE AND SCREEN  TYPE AND SCREEN   ____________________________________________  EKG  EKG was done it is not currently available I do not remember any acute changes being present ____________________________________________  RADIOLOGY  ED MD interpretation: X-ray humerus and chest show no acute disease I reviewed the films  Official radiology report(s): Dg Chest Portable 1 View  Result Date: 12/09/2017 CLINICAL DATA:  Weakness.  Left shoulder pain. EXAM: PORTABLE CHEST 1 VIEW COMPARISON:  Single-view of the chest 09/14/2014. PA and lateral chest 08/20/2014. FINDINGS: The lungs are clear. Heart size is mildly enlarged. Aortic atherosclerosis is noted. No pneumothorax or pleural effusion. No acute bony abnormality. IMPRESSION: No acute disease. Atherosclerosis. Electronically Signed   By: Inge Rise M.D.   On: 12/09/2017 12:49   Dg Humerus Left  Result Date: 12/09/2017 CLINICAL DATA:  Weakness with left shoulder pain. EXAM: LEFT HUMERUS - 2+ VIEW COMPARISON:  None. FINDINGS: Minimal spurring at the acromioclavicular joint without joint narrowing. Negative glenohumeral joint. No evidence of fracture or bone lesion. No soft tissue calcification. Minimal spurring at the olecranon and medial elbow joint. IMPRESSION: No acute or focal finding. Electronically Signed   By: Monte Fantasia M.D.   On: 12/09/2017 12:48    ____________________________________________   PROCEDURES  Procedure(s) performed:   Procedures  Critical Care performed:  Critical care time half an hour including reviewing the old records speaking with the hospital Dr. and the patient ____________________________________________   INITIAL IMPRESSION / [place] / ED COURSE  Patient has a GI bleed her shoulder is  painful but bruising I believe the bruising is what is causing the pain there is no bony injury.  We will have to admit her for the GI bleed however.        ____________________________________________   FINAL CLINICAL IMPRESSION(S) / ED DIAGNOSES  Final diagnoses:  Rectal bleeding     ED Discharge Orders    None       Note:  This document was prepared using Dragon voice recognition software and may include unintentional dictation errors.    Nena Polio, MD 12/09/17 [phone]

## 2017-12-09 NOTE — ED Notes (Signed)
Admitting MD at bedside.

## 2017-12-09 NOTE — ED Notes (Signed)
Pt cleaned up and repositioned in bed. Pt had bright red blood noted when wiping . RN informed. Warm blanket given. Family at bedside

## 2017-12-09 NOTE — Progress Notes (Addendum)
Hypoglycemic Event  CBG: 22  Treatment: 1 cup of apple juice.   Symptoms: none  Follow-up CBG: Time: 17:25 -  (Glucose, random) CBG Result : 72  Possible Reasons for Event: pt. Has vascular circulation issue. Pt has "RAYNUD Disease"   Comments/MD notified  Pt. is alert and oriented x 4. No signs of hypoglycemic symptoms.  Glucose serum lab drawn to confirm .  D50 IV 25 mL, given after GLucose, random was drawn before it was resulted.    Royal [place] H

## 2017-12-09 NOTE — H&P (Addendum)
[facility] NAME: [name]    MR#:  [id number]  DATE OF BIRTH:  16-Sep-1927  DATE OF ADMISSION:  12/09/2017  PRIMARY CARE PHYSICIAN: Rosaland Lao, NP   REQUESTING/REFERRING PHYSICIAN: Dr. Rip Harbour  CHIEF COMPLAINT:   Chief Complaint  Patient presents with  . Rectal Bleeding   Rectal bleeding today. HISTORY OF PRESENT ILLNESS:  [name]  is a 84 y.o. female with a known history of multiple medical problems including CVA, asthma, arthritis, kidney cancer, CKD stage III, chronic pain syndrome, hypertension, diabetes, emphysema, GERD, hyperlipidemia, gout etc. the patient is sent to ED from assisted living due to above chief complaints.  The patient is alert awake.  She complains of rectal bleeding with fresh blood today.  She denies any abdominal pain, nausea or vomiting or diarrhea.  Hemoglobin is 11.2.  Dr. Rip Harbour requested admission.  PAST MEDICAL HISTORY:   Past Medical History:  Diagnosis Date  . Abnormal glucose   . Anxiety   . Arthritis   . Asthma   . Calculus of gallbladder without mention of cholecystitis, with obstruction   . Cancer ([place])    kidney  . Candidiasis of the esophagus   . Cataract cortical, senile   . Chronic kidney disease, stage III (moderate) (HCC)   . Chronic pain   . Chronic pain syndrome   . Corns and callosities   . Debility   . Depression   . Dermatophytosis of nail   . Diabetes mellitus without complication ([place])    unspecified, without mention of complication  . Difficult intubation   . Dysphagia   . Dyspnea    with exertion  . Edema, unspecified   . Emphysema lung ([place])   . Emphysema of lung ([place])   . Esophageal reflux   . Essential hypertension, benign   . Generalized atherosclerosis   . Glaucoma (increased eye pressure)   . Gout, unspecified   . Hemiplegia ([place])   . Hereditary and idiopathic peripheral neuropathy   . Hyperglycemia   . Hyperlipidemia   .  Hypertension   . Hypoglycemia, unspecified   . Hypopotassemia   . Hypothyroidism   . Migraine, unspecified, without mention of intractable migraine without mention of status migrainosus   . Other vitamin B12 deficiency anemia   . Pain in limb   . Peripheral vascular disease ([place])   . Personal history of fall   . Stroke ([place])   . Thyroid disease   . Ulcer of foot ([place])   . Unspecified asthma(493.90)   . Unspecified constipation   . Unspecified hypertensive heart disease with heart failure(402.91)   . Unspecified urinary incontinence   . Unspecified vitamin D deficiency     PAST SURGICAL HISTORY:   Past Surgical History:  Procedure Laterality Date  . ABDOMINAL HYSTERECTOMY    . arterial thrombi stents in both legs    . BACK SURGERY    . BACK SURGERY    . CATARACT EXTRACTION    . COLONOSCOPY    . ESOPHAGOGASTRODUODENOSCOPY (EGD) WITH PROPOFOL N/A 06/19/2016   Procedure: ESOPHAGOGASTRODUODENOSCOPY (EGD) WITH PROPOFOL;  Surgeon: Lollie Sails, MD;  Location: [facility] ENDOSCOPY;  Service: Endoscopy;  Laterality: N/A;  . ESOPHAGOGASTRODUODENOSCOPY (EGD) WITH PROPOFOL N/A 04/10/2017   Procedure: ESOPHAGOGASTRODUODENOSCOPY (EGD) WITH PROPOFOL;  Surgeon: Lollie Sails, MD;  Location: [facility] ENDOSCOPY;  Service: Endoscopy;  Laterality: N/A;  . EYE SURGERY     cataract extraction  . LOWER EXTREMITY  ANGIOGRAPHY Right 10/07/2016   Procedure: Lower Extremity Angiography;  Surgeon: Katha Cabal, MD;  Location: [place] CV LAB;  Service: Cardiovascular;  Laterality: Right;  . PERIPHERAL VASCULAR CATHETERIZATION Left 09/02/2016   Procedure: Lower Extremity Angiography;  Surgeon: Katha Cabal, MD;  Location: [place] [facility] LAB;  Service: Cardiovascular;  Laterality: Left;  . PERIPHERAL VASCULAR CATHETERIZATION Right 09/16/2016   Procedure: Lower Extremity Angiography;  Surgeon: Katha Cabal, MD;  Location: [place] [facility] LAB;  Service: Cardiovascular;  Laterality: Right;     SOCIAL HISTORY:   Social History   Tobacco Use  . Smoking status: Never Smoker  . Smokeless tobacco: Never Used  Substance Use Topics  . Alcohol use: No    FAMILY HISTORY:   Family History  Problem Relation Age of Onset  . Cancer Father   . Stroke Father   . Seizures Mother   . Lung cancer Sister   . Bone cancer Sister     DRUG ALLERGIES:  No Known Allergies  REVIEW OF SYSTEMS:   Review of Systems  Constitutional: Negative for chills, fever and malaise/fatigue.  HENT: Negative for sore throat.   Eyes: Negative for blurred vision and double vision.  Respiratory: Negative for cough, hemoptysis, shortness of breath, wheezing and stridor.   Cardiovascular: Negative for chest pain, palpitations, orthopnea and leg swelling.  Gastrointestinal: Positive for blood in stool. Negative for abdominal pain, diarrhea, melena, nausea and vomiting.  Genitourinary: Negative for dysuria, flank pain and hematuria.  Musculoskeletal: Negative for back pain and joint pain.  Skin: Negative for rash.  Neurological: Positive for focal weakness. Negative for dizziness, sensory change, seizures, loss of consciousness, weakness and headaches.  Endo/Heme/Allergies: Negative for polydipsia.  Psychiatric/Behavioral: Negative for depression. The patient is not nervous/anxious.     MEDICATIONS AT HOME:   Prior to Admission medications   Medication Sig Start Date End Date Taking? Authorizing Provider  acetaminophen (TYLENOL) 325 MG tablet Take 650 mg by mouth every 4 (four) hours as needed for mild pain.    Yes [provider]  allopurinol (ZYLOPRIM) 100 MG tablet Take 100 mg by mouth daily.   Yes [provider]  amitriptyline (ELAVIL) 25 MG tablet Take 12.5 mg by mouth 2 (two) times daily.    Yes [provider]  aspirin EC 81 MG EC tablet Take 1 tablet (81 mg total) by mouth daily. 07/22/16  Yes Fritzi Mandes, MD  Cholecalciferol (VITAMIN D-3) 1000 UNITS CAPS Take  2,000 Units by mouth daily.    Yes [provider]  clopidogrel (PLAVIX) 75 MG tablet Take 75 mg by mouth daily with breakfast.   Yes [provider]  DULoxetine (CYMBALTA) 30 MG capsule Take 30 mg by mouth every morning.   Yes [provider]  furosemide (LASIX) 20 MG tablet Take 1 tablet (20 mg total) by mouth every other day. Patient taking differently: Take 40 mg by mouth daily.  07/22/16  Yes Fritzi Mandes, MD  gabapentin (NEURONTIN) 400 MG capsule Take 400 mg by mouth 2 (two) times daily.  11/28/17  Yes [provider]  gabapentin (NEURONTIN) 600 MG tablet Take 900 mg by mouth at bedtime.  07/11/17  Yes [provider]  latanoprost (XALATAN) 0.005 % ophthalmic solution Place 1 drop into both eyes at bedtime.   Yes [provider]  levothyroxine (SYNTHROID, LEVOTHROID) 25 MCG tablet Take 25 mcg by mouth daily before breakfast.   Yes [provider]  metoprolol tartrate (LOPRESSOR) 25 MG  tablet Take 12.5 mg by mouth 2 (two) times daily.   Yes [provider]  ondansetron (ZOFRAN) 4 MG tablet Take 4 mg by mouth every 8 (eight) hours as needed for nausea or vomiting.   Yes [provider]  pantoprazole (PROTONIX) 40 MG tablet Take 40 mg by mouth 2 (two) times daily.    Yes [provider]  senna (SENOKOT) 8.6 MG tablet Take 1 tablet by mouth 3 (three) times daily as needed for constipation.   Yes [provider]  topiramate (TOPAMAX) 25 MG tablet Take 25 mg by mouth at bedtime.   Yes [provider]  traMADol (ULTRAM) 50 MG tablet Take 50 mg by mouth 3 (three) times daily. And every 8 hours as needed for pain control   Yes [provider]  atorvastatin (LIPITOR) 40 MG tablet Take 1 tablet (40 mg total) by mouth daily at 6 PM. Patient not taking: Reported on 12/09/2017 07/22/16   Fritzi Mandes, MD      VITAL SIGNS:  Blood pressure (!) 140/59, pulse 73, temperature 98 F (36.7 C),  temperature source Oral, resp. rate 11, height 5\' 4"  (1.626 m), weight 180 lb (81.6 kg), SpO2 97 %.  PHYSICAL EXAMINATION:  Physical Exam  GENERAL:  84 y.o.-year-old patient lying in the bed with no acute distress.  EYES: Pupils equal, round, reactive to light and accommodation. No scleral icterus. Extraocular muscles intact.  HEENT: Head atraumatic, normocephalic. Oropharynx and nasopharynx clear.  NECK:  Supple, no jugular venous distention. No thyroid enlargement, no tenderness.  LUNGS: Normal breath sounds bilaterally, no wheezing, rales,rhonchi or crepitation. No use of accessory muscles of respiration.  CARDIOVASCULAR: S1, S2 normal. No murmurs, rubs, or gallops.  ABDOMEN: Soft, mild tenderness on LLQ, nondistended. Bowel sounds present. No organomegaly or mass.  EXTREMITIES: No pedal edema, cyanosis, or clubbing.  NEUROLOGIC: Cranial nerves II through XII are intact. Muscle strength 5/5 in all extremities except 1/2 in right leg. Sensation intact. Gait not checked.  PSYCHIATRIC: The patient is alert and oriented x 3.  SKIN: No obvious rash, lesion, or ulcer.   LABORATORY PANEL:   CBC Recent Labs  Lab 12/09/17 1311  WBC 7.0  HGB 11.2*  HCT 34.4*  PLT 246   ------------------------------------------------------------------------------------------------------------------  Chemistries  Recent Labs  Lab 12/09/17 1135  NA 138  K 4.4  CL 107  CO2 25  GLUCOSE 91  BUN 27*  CREATININE 1.59*  CALCIUM 9.0  AST 33  ALT 14  ALKPHOS 266*  BILITOT 0.5   ------------------------------------------------------------------------------------------------------------------  Cardiac Enzymes No results for input(s): TROPONINI in the last 168 hours. ------------------------------------------------------------------------------------------------------------------  RADIOLOGY:  Dg Chest Portable 1 View  Result Date: 12/09/2017 CLINICAL DATA:  Weakness.  Left shoulder pain. EXAM:  PORTABLE CHEST 1 VIEW COMPARISON:  Single-view of the chest 09/14/2014. PA and lateral chest 08/20/2014. FINDINGS: The lungs are clear. Heart size is mildly enlarged. Aortic atherosclerosis is noted. No pneumothorax or pleural effusion. No acute bony abnormality. IMPRESSION: No acute disease. Atherosclerosis. Electronically Signed   By: Inge Rise M.D.   On: 12/09/2017 12:49   Dg Humerus Left  Result Date: 12/09/2017 CLINICAL DATA:  Weakness with left shoulder pain. EXAM: LEFT HUMERUS - 2+ VIEW COMPARISON:  None. FINDINGS: Minimal spurring at the acromioclavicular joint without joint narrowing. Negative glenohumeral joint. No evidence of fracture or bone lesion. No soft tissue calcification. Minimal spurring at the olecranon and medial elbow joint. IMPRESSION: No acute or focal finding. Electronically Signed   By:  [place] Fantasia M.D.   On: 12/09/2017 12:48      IMPRESSION AND PLAN:   Rectal bleeding. The patient will be placed for observation. Clear liquid diet and n.p.o. after midnight for possible colonoscopy. Check hemoglobin every 6 hours, hold aspirin and Plavix, GI consult.   ARF on CKD stage 3.  Start normal saline IV and follow-up BMP.  Diabetes.  Start sliding scale. Hypertension.  Continue hypertension medication.  History of stroke with right leg weakness.  Hold aspirin and Plavix, continue statin. All the records are reviewed and case discussed with ED provider. Management plans discussed with the patient, her son and they are in agreement.  CODE STATUS: DNR  TOTAL TIME TAKING CARE OF THIS PATIENT: 45 minutes.    Demetrios Loll M.D on 12/09/2017 at 2:42 PM  Between 7am to 6pm - Pager - [phone]  After 6pm go to [email or web address] - Proofreader  [facility] [place] [facility]  Office  [phone]  CC: Primary care physician; Rosaland Lao, NP   Note: This dictation was prepared with Dragon dictation along with smaller phrase technology. Any  transcriptional errors that result from this process are unin

## 2017-12-09 NOTE — ED Notes (Signed)
Pt currently requesting pain medication. Per MD, ok to give 1g tylenol for pain.

## 2017-12-09 NOTE — ED Notes (Signed)
Nurse Jeanne Ivan and Kendal in room with patient at this time.

## 2017-12-09 NOTE — ED Triage Notes (Signed)
Patient arrives via [facility] from [facility] with complaints of rectal bleeding. Bleeding began Monday, per staff. Patient feels weaker & more fatigued than normal. Per staff at facility, patient is not walking with her walker like she normally does, but rather is staying in her room.

## 2017-12-09 NOTE — ED Notes (Signed)
Per pt, she needs her pills crushed in apple sauce to take.

## 2017-12-10 DIAGNOSIS — K625 Hemorrhage of anus and rectum: Secondary | ICD-10-CM | POA: Diagnosis not present

## 2017-12-10 LAB — GLUCOSE, CAPILLARY
GLUCOSE-CAPILLARY: 15 mg/dL — AB (ref 65–99)
GLUCOSE-CAPILLARY: 77 mg/dL (ref 65–99)
GLUCOSE-CAPILLARY: 60 mg/dL — AB (ref 65–99)
GLUCOSE-CAPILLARY: 87 mg/dL (ref 65–99)
GLUCOSE-CAPILLARY: 85 mg/dL (ref 65–99)
Glucose-Capillary: 14 mg/dL — CL (ref 65–99)
Glucose-Capillary: 67 mg/dL (ref 65–99)
Glucose-Capillary: 75 mg/dL (ref 65–99)

## 2017-12-10 LAB — BASIC METABOLIC PANEL
Anion gap: 9 (ref 5–15)
BUN: 28 mg/dL — AB (ref 6–20)
CO2: 24 mmol/L (ref 22–32)
CREATININE: 1.5 mg/dL — AB (ref 0.44–1.00)
Calcium: 8.6 mg/dL — ABNORMAL LOW (ref 8.9–10.3)
Chloride: 105 mmol/L (ref 101–111)
GFR calc Af Amer: 35 mL/min — ABNORMAL LOW (ref >60)
GFR calc non Af Amer: 30 mL/min — ABNORMAL LOW (ref >60)
Glucose, Bld: 104 mg/dL — ABNORMAL HIGH (ref 65–99)
POTASSIUM: 3.5 mmol/L (ref 3.5–5.1)
Sodium: 138 mmol/L (ref 135–145)

## 2017-12-10 LAB — CBC
HEMATOCRIT: 32.6 % — AB (ref 35.0–47.0)
Hemoglobin: 10.8 g/dL — ABNORMAL LOW (ref 12.0–16.0)
MCH: 30.6 pg (ref 26.0–34.0)
MCHC: 33.1 g/dL (ref 32.0–36.0)
MCV: 92.5 fL (ref 80.0–100.0)
PLATELETS: 219 10*3/uL (ref 150–440)
RBC: 3.53 MIL/uL — AB (ref 3.80–5.20)
RDW: 17.3 % — AB (ref 11.5–14.5)
WBC: 5 10*3/uL (ref 3.6–11.0)

## 2017-12-10 LAB — MRSA PCR SCREENING: MRSA by PCR: NEGATIVE

## 2017-12-10 LAB — HEMOGLOBIN
HEMOGLOBIN: 11 g/dL — AB (ref 12.0–16.0)
Hemoglobin: 10.5 g/dL — ABNORMAL LOW (ref 12.0–16.0)

## 2017-12-10 NOTE — Clinical Social Work Note (Signed)
Patient is from [facility], plan to return once patient is medically ready for discharge and orders have been received.  CSW to continue to follow patient's progress throughout discharge planning.  Jones Broom. Norval Morton, MSW, [place]  12/10/2017 7:47 PM

## 2017-12-10 NOTE — Plan of Care (Signed)
Pt had a dark red Bm. Dr. Verdell Carmine notified and will hold off discharge till tomorrow.

## 2017-12-10 NOTE — Consult Note (Signed)
Jonathon Bellows , MD [address] [place], [place], [place], [address] [address], [place], [place], [place], [address] Phone: [phone]  Fax: [phone]  Consultation  Referring Provider: DR Verdell Carmine Primary Care Physician:  Rosaland Lao, NP Primary Gastroenterologist: [facility]       Reason for Consultation: GI bleed   Date of Admission:  12/09/2017 Date of Consultation:  12/10/2017         HPI:   [name] is a 84 y.o. female with multiple co morbid conditions such as CVA, asthma , CKD, HTN, diabetes lives in assisted living and sent to the hospital for rectal bleeding .   Hb at baseliine on admission at 11.7 grams which it has been for the past 7 months.   H/o esophageal stricture managed by Dr Gustavo Lah . The patient has been on Plavix.   She says she had two bloody bowel movements that were painless yesterday , none since. Last colonoscopy she recollects was many years back. Presently no complaints/    CBC Latest Ref Rng & Units 12/10/2017 12/09/2017 12/09/2017  WBC 3.6 - 11.0 K/uL - 5.0 -  Hemoglobin 12.0 - 16.0 g/dL 10.5(L) 10.8(L) 11.7(L)  Hematocrit 35.0 - 47.0 % - 32.6(L) -  Platelets 150 - 440 K/uL - 219 -   This morning slightly lower at 10.5 grams.   Past Medical History:  Diagnosis Date  . Abnormal glucose   . Anxiety   . Arthritis   . Asthma   . Calculus of gallbladder without mention of cholecystitis, with obstruction   . Cancer ([place])    kidney  . Candidiasis of the esophagus   . Cataract cortical, senile   . Chronic kidney disease, stage III (moderate) (HCC)   . Chronic pain   . Chronic pain syndrome   . Corns and callosities   . Debility   . Depression   . Dermatophytosis of nail   . Diabetes mellitus without complication ([place])    unspecified, without mention of complication  . Difficult intubation   . Dysphagia   . Dyspnea    with exertion  . Edema, unspecified   . Emphysema lung ([place])   . Emphysema of lung ([place])   .  Esophageal reflux   . Essential hypertension, benign   . Generalized atherosclerosis   . Glaucoma (increased eye pressure)   . Gout, unspecified   . Hemiplegia ([place])   . Hereditary and idiopathic peripheral neuropathy   . Hyperglycemia   . Hyperlipidemia   . Hypertension   . Hypoglycemia, unspecified   . Hypopotassemia   . Hypothyroidism   . Migraine, unspecified, without mention of intractable migraine without mention of status migrainosus   . Other vitamin B12 deficiency anemia   . Pain in limb   . Peripheral vascular disease ([place])   . Personal history of fall   . Stroke ([place])   . Thyroid disease   . Ulcer of foot ([place])   . Unspecified asthma(493.90)   . Unspecified constipation   . Unspecified hypertensive heart disease with heart failure(402.91)   . Unspecified urinary incontinence   . Unspecified vitamin D deficiency     Past Surgical History:  Procedure Laterality Date  . ABDOMINAL HYSTERECTOMY    . arterial thrombi stents in both legs    . BACK SURGERY    . BACK SURGERY    . CATARACT EXTRACTION    . COLONOSCOPY    . ESOPHAGOGASTRODUODENOSCOPY (EGD) WITH PROPOFOL N/A 06/19/2016  Procedure: ESOPHAGOGASTRODUODENOSCOPY (EGD) WITH PROPOFOL;  Surgeon: Lollie Sails, MD;  Location: [facility] ENDOSCOPY;  Service: Endoscopy;  Laterality: N/A;  . ESOPHAGOGASTRODUODENOSCOPY (EGD) WITH PROPOFOL N/A 04/10/2017   Procedure: ESOPHAGOGASTRODUODENOSCOPY (EGD) WITH PROPOFOL;  Surgeon: Lollie Sails, MD;  Location[facility] ENDOSCOPY;  Service: Endoscopy;  Laterality: N/A;  . EYE SURGERY     cataract extraction  . LOWER EXTREMITY ANGIOGRAPHY Right 10/07/2016   Procedure: Lower Extremity Angiography;  Surgeon: Katha Cabal, MD;  Location: [place] CV LAB;  Service: Cardiovascular;  Laterality: Right;  . PERIPHERAL VASCULAR CATHETERIZATION Left 09/02/2016   Procedure: Lower Extremity Angiography;  Surgeon: Katha Cabal, MD;  Location: [place] CV LAB;  Service:  Cardiovascular;  Laterality: Left;  . PERIPHERAL VASCULAR CATHETERIZATION Right 09/16/2016   Procedure: Lower Extremity Angiography;  Surgeon: Katha Cabal, MD;  Location: [place] [facility];  Service: Cardiovascular;  Laterality: Right;    Prior to Admission medications   Medication Sig Start Date End Date Taking? Authorizing Provider  acetaminophen (TYLENOL) 325 MG tablet Take 650 mg by mouth every 4 (four) hours as needed for mild pain.    Yes [provider]  allopurinol (ZYLOPRIM) 100 MG tablet Take 100 mg by mouth daily.   Yes [provider]  amitriptyline (ELAVIL) 25 MG tablet Take 12.5 mg by mouth 2 (two) times daily.    Yes [provider]  aspirin EC 81 MG EC tablet Take 1 tablet (81 mg total) by mouth daily. 07/22/16  Yes Fritzi Mandes, MD  Cholecalciferol (VITAMIN D-3) 1000 UNITS CAPS Take 2,000 Units by mouth daily.    Yes [provider]  clopidogrel (PLAVIX) 75 MG tablet Take 75 mg by mouth daily with breakfast.   Yes [provider]  DULoxetine (CYMBALTA) 30 MG capsule Take 30 mg by mouth every morning.   Yes [provider]  furosemide (LASIX) 20 MG tablet Take 1 tablet (20 mg total) by mouth every other day. Patient taking differently: Take 40 mg by mouth daily.  07/22/16  Yes Fritzi Mandes, MD  gabapentin (NEURONTIN) 400 MG capsule Take 400 mg by mouth 2 (two) times daily.  11/28/17  Yes [provider]  gabapentin (NEURONTIN) 600 MG tablet Take 900 mg by mouth at bedtime.  07/11/17  Yes [provider]  latanoprost (XALATAN) 0.005 % ophthalmic solution Place 1 drop into both eyes at bedtime.   Yes [provider]  levothyroxine (SYNTHROID, LEVOTHROID) 25 MCG tablet Take 25 mcg by mouth daily before breakfast.   Yes [provider]  metoprolol tartrate (LOPRESSOR) 25 MG tablet Take 12.5 mg by mouth 2 (two) times daily.   Yes [provider]  ondansetron (ZOFRAN) 4 MG tablet Take  4 mg by mouth every 8 (eight) hours as needed for nausea or vomiting.   Yes [provider]  pantoprazole (PROTONIX) 40 MG tablet Take 40 mg by mouth 2 (two) times daily.    Yes [provider]  senna (SENOKOT) 8.6 MG tablet Take 1 tablet by mouth 3 (three) times daily as needed for constipation.   Yes [provider]  topiramate (TOPAMAX) 25 MG tablet Take 25 mg by mouth at bedtime.   Yes [provider]  traMADol (ULTRAM) 50 MG tablet Take 50 mg by mouth 3 (three) times daily. And every 8 hours as needed for pain control   Yes [provider]  atorvastatin (LIPITOR) 40 MG tablet Take 1 tablet (40 mg total) by mouth daily  at 6 PM. Patient not taking: Reported on 12/09/2017 07/22/16   Fritzi Mandes, MD    Family History  Problem Relation Age of Onset  . Cancer Father   . Stroke Father   . Seizures Mother   . Lung cancer Sister   . Bone cancer Sister      Social History   Tobacco Use  . Smoking status: Never Smoker  . Smokeless tobacco: Never Used  Substance Use Topics  . Alcohol use: No  . Drug use: No    Allergies as of 12/09/2017  . (No Known Allergies)    Review of Systems:    All systems reviewed and negative except where noted in HPI.   Physical Exam:  Vital signs in last 24 hours: Temp:  [97.9 F (36.6 C)-98.4 F (36.9 C)] 98.4 F (36.9 C) (04/25 0532) Pulse Rate:  [54-73] 54 (04/25 0532) Resp:  [11-18] 18 (04/25 0532) BP: (122-184)/(49-74) 140/49 (04/25 0532) SpO2:  [68 %-100 %] 98 % (04/25 0532) Weight:  [180 lb (81.6 kg)] 180 lb (81.6 kg) (04/24 1130) Last BM Date: 12/09/17 General:   Pleasant, cooperative in NAD Head:  Normocephalic and atraumatic. Eyes:   No icterus.   Conjunctiva pink. PERRLA. Ears:  Normal auditory acuity. Neck:  Supple; no masses or thyroidomegaly Lungs: Respirations even and unlabored. Lungs clear to auscultation bilaterally.   No wheezes, crackles, or rhonchi.  Heart:  Regular rate and  rhythm;  Without murmur, clicks, rubs or gallops Abdomen:  Soft, nondistended, nontender. Normal bowel sounds. No appreciable masses or hepatomegaly.  No rebound or guarding.  Neurologic:  Alert and oriented x3;  grossly normal neurologically. Skin:  Intact without significant lesions or rashes. Cervical Nodes:  No significant cervical adenopathy. Psych:  Alert and cooperative. Normal affect.  LAB RESULTS: Recent Labs    12/09/17 1311 12/09/17 1725 12/09/17 2349 12/10/17 0543  WBC 7.0  --  5.0  --   HGB 11.2* 11.7* 10.8* 10.5*  HCT 34.4*  --  32.6*  --   PLT 246  --  219  --    BMET Recent Labs    12/09/17 1135 12/09/17 1725 12/09/17 2349  NA 138  --  138  K 4.4  --  3.5  CL 107  --  105  CO2 25  --  24  GLUCOSE 91 72 104*  BUN 27*  --  28*  CREATININE 1.59*  --  1.50*  CALCIUM 9.0  --  8.6*   LFT Recent Labs    12/09/17 1135  PROT 8.2*  ALBUMIN 3.6  AST 33  ALT 14  ALKPHOS 266*  BILITOT 0.5   PT/INR No results for input(s): LABPROT, INR in the last 72 hours.  STUDIES: Dg Chest Portable 1 View  Result Date: 12/09/2017 CLINICAL DATA:  Weakness.  Left shoulder pain. EXAM: PORTABLE CHEST 1 VIEW COMPARISON:  Single-view of the chest 09/14/2014. PA and lateral chest 08/20/2014. FINDINGS: The lungs are clear. Heart size is mildly enlarged. Aortic atherosclerosis is noted. No pneumothorax or pleural effusion. No acute bony abnormality. IMPRESSION: No acute disease. Atherosclerosis. Electronically Signed   By: Inge Rise M.D.   On: 12/09/2017 12:49   Dg Humerus Left  Result Date: 12/09/2017 CLINICAL DATA:  Weakness with left shoulder pain. EXAM: LEFT HUMERUS - 2+ VIEW COMPARISON:  None. FINDINGS: Minimal spurring at the acromioclavicular joint without joint narrowing. Negative glenohumeral joint. No evidence of fracture or bone lesion. No soft tissue calcification. Minimal spurring at the  olecranon and medial elbow joint. IMPRESSION: No acute or focal finding.  Electronically Signed   By: Monte Fantasia M.D.   On: 12/09/2017 12:48      Impression / Plan:   [name] is a 84 y.o. y/o female with multiple comorbidity . Sees Dr Gustavo Lah at [facility] for dysphagia . Here today sent from assisted living for rectal bleeding . Hb a bit lower at 10.5 grams compared to baseline of 11.7 grams. She is on Plavix. No recent  Colonoscopy on Epic shows no recent report . History suggestive of a diverticular bleed.   Plan  1. Monitor CBC 2. If stable then she could go home and have an outpatient colonoscopy by Dr Gustavo Lah as she is on Plavix and needs to be held for 5 days prior to the procedure.  3. If Hb drops and there is concern for ongoing bleed if severe then needs tagged RBC scan followed by colonoscopy .   Thank you for involving me in the care of this patient.      LOS: 0 days   Jonathon Bellows, MD  12/10/2017, 9:53 AM

## 2017-12-10 NOTE — Progress Notes (Signed)
pts BG 34 when checked by NT. Rechecked on different finger and it was 67. Pt has poor perfusion due to Raynauds. Gave pt orange juice and recked checked after 15 minutes 85

## 2017-12-10 NOTE — Progress Notes (Signed)
[place] at [place] NAME: [name]    MR#:  [id number]  DATE OF BIRTH:  [birth year]  SUBJECTIVE:   Patient presented to the hospital due to rectal bleeding.  No acute bleeding overnight.  Hemoglobin stable.  Will start on clear liquid diet today.  Aspirin Plavix is on hold.  REVIEW OF SYSTEMS:    Review of Systems  Constitutional: Negative for chills and fever.  HENT: Negative for congestion and tinnitus.   Eyes: Negative for blurred vision and double vision.  Respiratory: Negative for cough, shortness of breath and wheezing.   Cardiovascular: Negative for chest pain, orthopnea and PND.  Gastrointestinal: Negative for abdominal pain, diarrhea, nausea and vomiting.  Genitourinary: Negative for dysuria and hematuria.  Neurological: Negative for dizziness, sensory change and focal weakness.  All other systems reviewed and are negative.   Nutrition: Clear Liquid  Tolerating Diet: Yes Tolerating PT: Await Eval.   DRUG ALLERGIES:  No Known Allergies  VITALS:  Blood pressure (!) 140/49, pulse (!) 54, temperature 98.4 F (36.9 C), temperature source Oral, resp. rate 18, height 5\' 4"  (1.626 m), weight 81.6 kg (180 lb), SpO2 98 %.  PHYSICAL EXAMINATION:   Physical Exam  GENERAL:  84 y.o.-year-old patient lying in bed in no acute distress.  EYES: Pupils equal, round, reactive to light and accommodation. No scleral icterus. Extraocular muscles intact.  HEENT: Head atraumatic, normocephalic. Oropharynx and nasopharynx clear.  NECK:  Supple, no jugular venous distention. No thyroid enlargement, no tenderness.  LUNGS: Normal breath sounds bilaterally, no wheezing, rales, rhonchi. No use of accessory muscles of respiration.  CARDIOVASCULAR: S1, S2 normal. No murmurs, rubs, or gallops.  ABDOMEN: Soft, nontender, nondistended. Bowel sounds present. No organomegaly or mass.  EXTREMITIES: No cyanosis, clubbing or edema b/l.    NEUROLOGIC:  Cranial nerves II through XII are intact. No focal Motor or sensory deficits b/l.   PSYCHIATRIC: The patient is alert and oriented x 3.  SKIN: No obvious rash, lesion, or ulcer.    LABORATORY PANEL:   CBC Recent Labs  Lab 12/09/17 2349 12/10/17 0543  WBC 5.0  --   HGB 10.8* 10.5*  HCT 32.6*  --   PLT 219  --    ------------------------------------------------------------------------------------------------------------------  Chemistries  Recent Labs  Lab 12/09/17 1135  12/09/17 2349  NA 138  --  138  K 4.4  --  3.5  CL 107  --  105  CO2 25  --  24  GLUCOSE 91   < > 104*  BUN 27*  --  28*  CREATININE 1.59*  --  1.50*  CALCIUM 9.0  --  8.6*  AST 33  --   --   ALT 14  --   --   ALKPHOS 266*  --   --   BILITOT 0.5  --   --    < > = values in this interval not displayed.   ------------------------------------------------------------------------------------------------------------------  Cardiac Enzymes No results for input(s): TROPONINI in the last 168 hours. ------------------------------------------------------------------------------------------------------------------  RADIOLOGY:  Dg Chest Portable 1 View  Result Date: 12/09/2017 CLINICAL DATA:  Weakness.  Left shoulder pain. EXAM: PORTABLE CHEST 1 VIEW COMPARISON:  Single-view of the chest 09/14/2014. PA and lateral chest 08/20/2014. FINDINGS: The lungs are clear. Heart size is mildly enlarged. Aortic atherosclerosis is noted. No pneumothorax or pleural effusion. No acute bony abnormality. IMPRESSION: No acute disease. Atherosclerosis. Electronically Signed   By: Inge Rise M.D.   On: 12/09/2017  12:49   Dg Humerus Left  Result Date: 12/09/2017 CLINICAL DATA:  Weakness with left shoulder pain. EXAM: LEFT HUMERUS - 2+ VIEW COMPARISON:  None. FINDINGS: Minimal spurring at the acromioclavicular joint without joint narrowing. Negative glenohumeral joint. No evidence of fracture or bone lesion. No soft tissue  calcification. Minimal spurring at the olecranon and medial elbow joint. IMPRESSION: No acute or focal finding. Electronically Signed   By: Monte Fantasia M.D.   On: 12/09/2017 12:48     ASSESSMENT AND PLAN:   84 yo female w/ hx of PVD, HtN, Hyperlipidemia, DM, Depression/Anxiety, Hypothyroidism, GERD, Glaucoma, Gout, hx of CVA, who presented to the hospital due to Rectal Bleeding.   1.  Rectal bleeding-suspected to be a lower GI bleed.  No further bleeding overnight.  Hemoglobin remained stable. -Continue to hold aspirin, Plavix.  Await gastroenterology input.  I suspect this is likely a diverticular or hemorrhoidal bleed. -We will start patient on clear liquid diet.  2.  History of hypothyroidism-continue Synthroid.  3.  Essential hypertension-continue metoprolol.  4.  Glaucoma-continue latanoprost eyedrops.    5.  History of gout-continue allopurinol.    6.  Neuropathy-continue gabapentin.  7.  Depression-continue Cymbalta.  If hemoglobin remains stable and patient is tolerating p.o. well with diet advanced possible discharge back to assisted living later today.  All the records are reviewed and case discussed with Care Management/Social Worker. Management plans discussed with the patient, family and they are in agreement.  CODE STATUS: DNR  DVT Prophylaxis: Teds/SCDs  TOTAL TIME TAKING CARE OF THIS PATIENT: 30 minutes.   POSSIBLE D/C IN 1-2 DAYS, DEPENDING ON CLINICAL CONDITION.   Henreitta Leber M.D on 12/10/2017 at 11:40 AM  Between 7am to 6pm - Pager - [phone]  After 6pm go to [email or web address] - Technical brewer [facility]  Office  [phone]  CC: Primary care physician; Rosaland Lao, NP

## 2017-12-10 NOTE — Progress Notes (Signed)
Inpatient Diabetes Program Recommendations  AACE/ADA: New Consensus Statement on Inpatient Glycemic Control (2015)  Target Ranges:  Prepandial:   less than 140 mg/dL      Peak postprandial:   less than 180 mg/dL (1-2 hours)      Critically ill patients:  140 - 180 mg/dL   Lab Results  Component Value Date   GLUCAP 60 (L) 12/10/2017   HGBA1C 5.4 05/03/2017    Review of Glycemic ControlResults for VERDIS, BASSETTE (MRN [id number]) as of 12/10/2017 11:37  Ref. Range 12/09/2017 17:11 12/09/2017 18:06 12/09/2017 21:40 12/10/2017 07:44 12/10/2017 11:30  Glucose-Capillary Latest Ref Range: 65 - 99 mg/dL 22 (LL) 148 (H) 78 77 60 (L)   Diabetes history: DM  Outpatient Diabetes medications: None Current orders for Inpatient glycemic control:  Novolog sensitive tid with meals and HS Inpatient Diabetes Program Recommendations:    Patient with low CBG's.  Consider d/c of Novolog correction and she may possibly need dextrose in IV fluids?   Thanks,  Adah Perl, RN, BC-ADM Inpatient Diabetes Coordinator Pager [phone] (8a-5p)

## 2017-12-11 DIAGNOSIS — K625 Hemorrhage of anus and rectum: Secondary | ICD-10-CM | POA: Diagnosis not present

## 2017-12-11 LAB — HEMOGLOBIN: Hemoglobin: 11.1 g/dL — ABNORMAL LOW (ref 12.0–16.0)

## 2017-12-11 LAB — GLUCOSE, CAPILLARY
GLUCOSE-CAPILLARY: 75 mg/dL (ref 65–99)
Glucose-Capillary: 34 mg/dL — CL (ref 65–99)
Glucose-Capillary: 71 mg/dL (ref 65–99)

## 2017-12-11 MED ORDER — HYDRALAZINE HCL 25 MG PO TABS
25.0000 mg | ORAL_TABLET | Freq: Once | ORAL | Status: AC
  Administered 2017-12-11: 25 mg via ORAL
  Filled 2017-12-11: qty 1

## 2017-12-11 NOTE — Care Management (Signed)
Hemoglobin has remained stable.  GI recommends outpatient colonoscopy if hemoglobin remains stable. Anticipate discharge today

## 2017-12-11 NOTE — Care Management Obs Status (Signed)
[facility] NOTIFICATION   Patient Details  Name: [name] MRN: [id number] Date of Birth: December 06, 1927   Medicare Observation Status Notification Given:  Yes    Katrina Stack, RN 12/11/2017, 8:15 AM

## 2017-12-11 NOTE — Clinical Social Work Note (Addendum)
Patient to be d/c'ed today to [facility].  Patient and family agreeable to plans will transport via ems RN to call report [phone].  CSW updated patient's son Ashok Pall, [phone] that patient will be discharging today.  Evette Cristal, MSW, [place]

## 2017-12-11 NOTE — Progress Notes (Signed)
[name] to be D/C'd Skilled nursing facility per MD order.  Discussed prescriptions and follow up appointments with the patient. Prescriptions given to patient, medication list explained in detail. Pt verbalized understanding.  Allergies as of 12/11/2017   No Known Allergies     Medication List    STOP taking these medications   atorvastatin 40 MG tablet Commonly known as:  LIPITOR   clopidogrel 75 MG tablet Commonly known as:  PLAVIX     TAKE these medications   acetaminophen 325 MG tablet Commonly known as:  TYLENOL Take 650 mg by mouth every 4 (four) hours as needed for mild pain.   allopurinol 100 MG tablet Commonly known as:  ZYLOPRIM Take 100 mg by mouth daily.   amitriptyline 25 MG tablet Commonly known as:  ELAVIL Take 12.5 mg by mouth 2 (two) times daily.   aspirin 81 MG EC tablet Take 1 tablet (81 mg total) by mouth daily.   DULoxetine 30 MG capsule Commonly known as:  CYMBALTA Take 30 mg by mouth every morning.   furosemide 20 MG tablet Commonly known as:  LASIX Take 1 tablet (20 mg total) by mouth every other day. What changed:    how much to take  when to take this   gabapentin 600 MG tablet Commonly known as:  NEURONTIN Take 900 mg by mouth at bedtime.   gabapentin 400 MG capsule Commonly known as:  NEURONTIN Take 400 mg by mouth 2 (two) times daily.   latanoprost 0.005 % ophthalmic solution Commonly known as:  XALATAN Place 1 drop into both eyes at bedtime.   levothyroxine 25 MCG tablet Commonly known as:  SYNTHROID, LEVOTHROID Take 25 mcg by mouth daily before breakfast.   metoprolol tartrate 25 MG tablet Commonly known as:  LOPRESSOR Take 12.5 mg by mouth 2 (two) times daily.   ondansetron 4 MG tablet Commonly known as:  ZOFRAN Take 4 mg by mouth every 8 (eight) hours as needed for nausea or vomiting.   pantoprazole 40 MG tablet Commonly known as:  PROTONIX Take 40 mg by mouth 2 (two) times daily.   senna 8.6 MG  tablet Commonly known as:  SENOKOT Take 1 tablet by mouth 3 (three) times daily as needed for constipation.   topiramate 25 MG tablet Commonly known as:  TOPAMAX Take 25 mg by mouth at bedtime.   traMADol 50 MG tablet Commonly known as:  ULTRAM Take 50 mg by mouth 3 (three) times daily. And every 8 hours as needed for pain control   Vitamin D-3 1000 units Caps Take 2,000 Units by mouth daily.       Vitals:   12/11/17 1318 12/11/17 1358  BP: (!) 180/74 (!) 160/64  Pulse: 65 67  Resp:    Temp: 98.1 F (36.7 C)   SpO2: 100%     Skin clean, dry and intact without evidence of skin break down, no evidence of skin tears noted. IV catheter discontinued intact. Site without signs and symptoms of complications. Dressing and pressure applied. Pt denies pain at this time. No complaints noted.  An After Visit Summary was printed and given to EMS. Patient transported via [facility] to [facility]

## 2017-12-11 NOTE — NC FL2 (Signed)
[place] LEVEL OF CARE SCREENING TOOL     IDENTIFICATION  Patient Name: [name] Birthdate: [birth year] Sex: female Admission Date (Current Location): 12/09/2017  [name] and [name] Number:  [name] [id number] [name]:  [name], [address], [place], [place] [address]      Provider Number: [id number]  Attending Physician Name and Address:  Henreitta Leber, MD  Relative Name and Phone Number:  [name] [phone]  [phone]     Current Level of Care: Hospital Recommended Level of Care: Assisted Living Facility([facility]) Prior Approval Number:    Date Approved/Denied:   PASRR Number:    Discharge Plan: Other (Comment)([facility])    Current Diagnoses: Patient Active Problem List   Diagnosis Date Noted  . GIB (gastrointestinal bleeding) 12/09/2017  . Chronic venous insufficiency 09/02/2017  . Lymphedema 09/02/2017  . Varicose veins of both lower extremities with pain 07/29/2017  . Hyperlipidemia 05/04/2017  . Hypothyroidism 05/04/2017  . Acute CVA (cerebrovascular accident) ([place]) 05/04/2017  . Gout 10/27/2016  . CVA (cerebral vascular accident) ([place]) 07/20/2016  . Essential hypertension 07/14/2016  . Atherosclerosis of native arteries of the extremities with gangrene ([place]) 07/14/2016  . PVD (peripheral vascular disease) ([place]) 07/14/2016    Orientation RESPIRATION BLADDER Height & Weight     Self  Normal Continent Weight: 180 lb (81.6 kg) Height:  5\' 4"  (162.6 cm)  BEHAVIORAL SYMPTOMS/MOOD NEUROLOGICAL BOWEL NUTRITION STATUS      Continent Regular Diet  AMBULATORY STATUS COMMUNICATION OF NEEDS Skin   Limited Assist Verbally Normal                       Personal Care Assistance Level of Assistance  Dressing, Feeding, Bathing Bathing Assistance: Limited assistance Feeding assistance: Independent Dressing Assistance: Limited assistance     Functional  Limitations Info  Speech, Hearing, Sight Sight Info: Adequate Hearing Info: Adequate Speech Info: Adequate    SPECIAL CARE FACTORS FREQUENCY                       Contractures Contractures Info: Not present    Additional Factors Info  Code Status Code Status Info: DNR             Current Medications (12/11/2017):  This is the current hospital active medication list Current Facility-Administered Medications  Medication Dose Route Frequency Provider Last Rate Last Dose  . acetaminophen (TYLENOL) tablet 650 mg  650 mg Oral Q6H PRN Demetrios Loll, MD       Or  . acetaminophen (TYLENOL) suppository 650 mg  650 mg Rectal Q6H PRN Demetrios Loll, MD      . albuterol (PROVENTIL) (2.5 MG/3ML) 0.083% nebulizer solution 2.5 mg  2.5 mg Nebulization Q2H PRN Demetrios Loll, MD      . allopurinol (ZYLOPRIM) tablet 100 mg  100 mg Oral Daily Demetrios Loll, MD   100 mg at 12/11/17 0901  . amitriptyline (ELAVIL) tablet 12.5 mg  12.5 mg Oral BID Demetrios Loll, MD   12.5 mg at 12/11/17 0902  . bisacodyl (DULCOLAX) EC tablet 5 mg  5 mg Oral Daily PRN Demetrios Loll, MD      . DULoxetine (CYMBALTA) DR capsule 30 mg  30 mg Oral Inez Catalina, Sheppard Evens, MD   30 mg at 12/11/17 8786  . gabapentin (NEURONTIN) capsule 400 mg  400 mg Oral BID Demetrios Loll, MD   400 mg at 12/11/17 0901  .  gabapentin (NEURONTIN) tablet 900 mg  900 mg Oral QHS Demetrios Loll, MD   900 mg at 12/10/17 2132  . HYDROcodone-acetaminophen (NORCO/VICODIN) 5-325 MG per tablet 1-2 tablet  1-2 tablet Oral Q4H PRN Demetrios Loll, MD   1 tablet at 12/10/17 2315  . latanoprost (XALATAN) 0.005 % ophthalmic solution 1 drop  1 drop Both Eyes QHS Demetrios Loll, MD   1 drop at 12/10/17 2134  . levothyroxine (SYNTHROID, LEVOTHROID) tablet 25 mcg  25 mcg Oral QAC breakfast Demetrios Loll, MD   25 mcg at 12/11/17 [phone]  . metoprolol tartrate (LOPRESSOR) tablet 12.5 mg  12.5 mg Oral BID Demetrios Loll, MD   Stopped at 12/11/17 1000  . ondansetron (ZOFRAN) tablet 4 mg  4 mg Oral Q6H PRN Demetrios Loll, MD       Or  . ondansetron [facility]) injection 4 mg  4 mg Intravenous Q6H PRN Demetrios Loll, MD      . pantoprazole (PROTONIX) EC tablet 40 mg  40 mg Oral BID Demetrios Loll, MD   40 mg at 12/11/17 0902  . senna-docusate (Senokot-S) tablet 1 tablet  1 tablet Oral QHS PRN Demetrios Loll, MD      . topiramate (TOPAMAX) tablet 25 mg  25 mg Oral QHS Demetrios Loll, MD   25 mg at 12/10/17 2133     Discharge Medications: STOP taking these medications   atorvastatin 40 MG tablet Commonly known as:  LIPITOR   clopidogrel 75 MG tablet Commonly known as:  PLAVIX     TAKE these medications   acetaminophen 325 MG tablet Commonly known as:  TYLENOL Take 650 mg by mouth every 4 (four) hours as needed for mild pain.   allopurinol 100 MG tablet Commonly known as:  ZYLOPRIM Take 100 mg by mouth daily.   amitriptyline 25 MG tablet Commonly known as:  ELAVIL Take 12.5 mg by mouth 2 (two) times daily.   aspirin 81 MG EC tablet Take 1 tablet (81 mg total) by mouth daily.   DULoxetine 30 MG capsule Commonly known as:  CYMBALTA Take 30 mg by mouth every morning.   furosemide 20 MG tablet Commonly known as:  LASIX Take 1 tablet (20 mg total) by mouth every other day. What changed:    how much to take  when to take this   gabapentin 600 MG tablet Commonly known as:  NEURONTIN Take 900 mg by mouth at bedtime.   gabapentin 400 MG capsule Commonly known as:  NEURONTIN Take 400 mg by mouth 2 (two) times daily.   latanoprost 0.005 % ophthalmic solution Commonly known as:  XALATAN Place 1 drop into both eyes at bedtime.   levothyroxine 25 MCG tablet Commonly known as:  SYNTHROID, LEVOTHROID Take 25 mcg by mouth daily before breakfast.   metoprolol tartrate 25 MG tablet Commonly known as:  LOPRESSOR Take 12.5 mg by mouth 2 (two) times daily.   ondansetron 4 MG tablet Commonly known as:  ZOFRAN Take 4 mg by mouth every 8 (eight) hours as needed for nausea or vomiting.    pantoprazole 40 MG tablet Commonly known as:  PROTONIX Take 40 mg by mouth 2 (two) times daily.   senna 8.6 MG tablet Commonly known as:  SENOKOT Take 1 tablet by mouth 3 (three) times daily as needed for constipation.   topiramate 25 MG tablet Commonly known as:  TOPAMAX Take 25 mg by mouth at bedtime.   traMADol 50 MG tablet Commonly known as:  ULTRAM Take 50 mg  by mouth 3 (three) times daily. And every 8 hours as needed for pain control   Vitamin D-3 1000 units Caps Take 2,000 Units by mouth daily.     Relevant Imaging Results:  Relevant Lab Results:   Additional Information SS# [id number]  Anell Barr

## 2017-12-11 NOTE — Progress Notes (Signed)
RN notified Dr. Posey Pronto of patient BP of 168/79. Okay for RN to give Metoprolol shedule for 1000 now.

## 2017-12-11 NOTE — Discharge Summary (Signed)
[place] [facility] [place] NAME: [name]    MR#:  [id number]  DATE OF BIRTH:  [birth year]  DATE OF ADMISSION:  12/09/2017 ADMITTING PHYSICIAN: Demetrios Loll, MD  DATE OF DISCHARGE: 12/11/2017  PRIMARY CARE PHYSICIAN: Rosaland Lao, NP    ADMISSION DIAGNOSIS:  Rectal bleeding [K62.5]  DISCHARGE DIAGNOSIS:  Active Problems:   GIB (gastrointestinal bleeding)   SECONDARY DIAGNOSIS:   Past Medical History:  Diagnosis Date  . Abnormal glucose   . Anxiety   . Arthritis   . Asthma   . Calculus of gallbladder without mention of cholecystitis, with obstruction   . Cancer ([place])    kidney  . Candidiasis of the esophagus   . Cataract cortical, senile   . Chronic kidney disease, stage III (moderate) (HCC)   . Chronic pain   . Chronic pain syndrome   . Corns and callosities   . Debility   . Depression   . Dermatophytosis of nail   . Diabetes mellitus without complication ([place])    unspecified, without mention of complication  . Difficult intubation   . Dysphagia   . Dyspnea    with exertion  . Edema, unspecified   . Emphysema lung ([place])   . Emphysema of lung ([place])   . Esophageal reflux   . Essential hypertension, benign   . Generalized atherosclerosis   . Glaucoma (increased eye pressure)   . Gout, unspecified   . Hemiplegia ([place])   . Hereditary and idiopathic peripheral neuropathy   . Hyperglycemia   . Hyperlipidemia   . Hypertension   . Hypoglycemia, unspecified   . Hypopotassemia   . Hypothyroidism   . Migraine, unspecified, without mention of intractable migraine without mention of status migrainosus   . Other vitamin B12 deficiency anemia   . Pain in limb   . Peripheral vascular disease ([place])   . Personal history of fall   . Stroke ([place])   . Thyroid disease   . Ulcer of foot ([place])   . Unspecified asthma(493.90)   . Unspecified constipation   . Unspecified hypertensive heart disease with heart failure(402.91)   .  Unspecified urinary incontinence   . Unspecified vitamin D deficiency     HOSPITAL COURSE:   84 yo female w/ hx of PVD, HtN, Hyperlipidemia, DM, Depression/Anxiety, Hypothyroidism, GERD, Glaucoma, Gout, hx of CVA, who presented to the hospital due to Rectal Bleeding.   1.  Rectal bleeding-suspected to be a lower GI bleed.   -Patient was observed in the hospital had serial hemoglobins drawn which were stable.  Patient's aspirin and Plavix were held.  Her bleeding has now resolved and her last bowel movement was brown in color. - Gastroenterology consult was obtained and the recommend colonoscopy but to be done as an outpatient.  This is likely a diverticular or hemorrhoidal bleed.  Patient is not tolerating a regular diet without any acute issues.  Patient will be discharged back on her aspirin but her Plavix is being held until GI work-up is done.  2.  History of hypothyroidism- she will continue Synthroid.  3.  Essential hypertension- she will continue metoprolol.  4.  Glaucoma- she will continue latanoprost eyedrops.    5.  History of gout- she will continue allopurinol.    6.  Neuropathy- she will continue gabapentin.  7.  Depression- she will continue Cymbalta.    DISCHARGE CONDITIONS:   Stable.   CONSULTS OBTAINED:  Treatment Team:  Jonathon Bellows, MD  DRUG ALLERGIES:  No Known Allergies  DISCHARGE MEDICATIONS:   Allergies as of 12/11/2017   No Known Allergies     Medication List    STOP taking these medications   atorvastatin 40 MG tablet Commonly known as:  LIPITOR   clopidogrel 75 MG tablet Commonly known as:  PLAVIX     TAKE these medications   acetaminophen 325 MG tablet Commonly known as:  TYLENOL Take 650 mg by mouth every 4 (four) hours as needed for mild pain.   allopurinol 100 MG tablet Commonly known as:  ZYLOPRIM Take 100 mg by mouth daily.   amitriptyline 25 MG tablet Commonly known as:  ELAVIL Take 12.5 mg by mouth 2 (two) times  daily.   aspirin 81 MG EC tablet Take 1 tablet (81 mg total) by mouth daily.   DULoxetine 30 MG capsule Commonly known as:  CYMBALTA Take 30 mg by mouth every morning.   furosemide 20 MG tablet Commonly known as:  LASIX Take 1 tablet (20 mg total) by mouth every other day. What changed:    how much to take  when to take this   gabapentin 600 MG tablet Commonly known as:  NEURONTIN Take 900 mg by mouth at bedtime.   gabapentin 400 MG capsule Commonly known as:  NEURONTIN Take 400 mg by mouth 2 (two) times daily.   latanoprost 0.005 % ophthalmic solution Commonly known as:  XALATAN Place 1 drop into both eyes at bedtime.   levothyroxine 25 MCG tablet Commonly known as:  SYNTHROID, LEVOTHROID Take 25 mcg by mouth daily before breakfast.   metoprolol tartrate 25 MG tablet Commonly known as:  LOPRESSOR Take 12.5 mg by mouth 2 (two) times daily.   ondansetron 4 MG tablet Commonly known as:  ZOFRAN Take 4 mg by mouth every 8 (eight) hours as needed for nausea or vomiting.   pantoprazole 40 MG tablet Commonly known as:  PROTONIX Take 40 mg by mouth 2 (two) times daily.   senna 8.6 MG tablet Commonly known as:  SENOKOT Take 1 tablet by mouth 3 (three) times daily as needed for constipation.   topiramate 25 MG tablet Commonly known as:  TOPAMAX Take 25 mg by mouth at bedtime.   traMADol 50 MG tablet Commonly known as:  ULTRAM Take 50 mg by mouth 3 (three) times daily. And every 8 hours as needed for pain control   Vitamin D-3 1000 units Caps Take 2,000 Units by mouth daily.         DISCHARGE INSTRUCTIONS:   DIET:  Cardiac diet  DISCHARGE CONDITION:  Stable  ACTIVITY:  Activity as tolerated  OXYGEN:  Home Oxygen: No.   Oxygen Delivery: room air  DISCHARGE LOCATION:  Assisted Living.    If you experience worsening of your admission symptoms, develop shortness of breath, life threatening emergency, suicidal or homicidal thoughts you must seek  medical attention immediately by calling 911 or calling your MD immediately  if symptoms less severe.  You Must read complete instructions/literature along with all the possible adverse reactions/side effects for all the Medicines you take and that have been prescribed to you. Take any new Medicines after you have completely understood and accpet all the possible adverse reactions/side effects.   Please note  You were cared for by a hospitalist during your hospital stay. If you have any questions about your discharge medications or the care you received while you were in the hospital after you are discharged, you can call the unit  and asked to speak with the hospitalist on call if the hospitalist that took care of you is not available. Once you are discharged, your primary care physician will handle any further medical issues. Please note that NO REFILLS for any discharge medications will be authorized once you are discharged, as it is imperative that you return to your primary care physician (or establish a relationship with a primary care physician if you do not have one) for your aftercare needs so that they can reassess your need for medications and monitor your lab values.     Today   No acute bleeding overnight, hemoglobin stable.  Patient had a brown stool overnight.  Will discharge back to her assisted living today.  Outpatient GI follow-up in a week to arrange outpatient colonoscopy.  VITAL SIGNS:  Blood pressure (!) 168/79, pulse 70, temperature 98.4 F (36.9 C), temperature source Oral, resp. rate 16, height 5\' 4"  (1.626 m), weight 81.6 kg (180 lb), SpO2 98 %.  I/O:    Intake/Output Summary (Last 24 hours) at 12/11/2017 1101 Last data filed at 12/11/2017 1028 Gross per 24 hour  Intake 480 ml  Output 1100 ml  Net -620 ml    PHYSICAL EXAMINATION:   GENERAL:  84 y.o.-year-old patient lying in bed in no acute distress.  EYES: Pupils equal, round, reactive to light and  accommodation. No scleral icterus. Extraocular muscles intact.  HEENT: Head atraumatic, normocephalic. Oropharynx and nasopharynx clear.  NECK:  Supple, no jugular venous distention. No thyroid enlargement, no tenderness.  LUNGS: Normal breath sounds bilaterally, no wheezing, rales, rhonchi. No use of accessory muscles of respiration.  CARDIOVASCULAR: S1, S2 normal. No murmurs, rubs, or gallops.  ABDOMEN: Soft, nontender, nondistended. Bowel sounds present. No organomegaly or mass.  EXTREMITIES: No cyanosis, clubbing or edema b/l.    NEUROLOGIC: Cranial nerves II through XII are intact. No focal Motor or sensory deficits b/l.   PSYCHIATRIC: The patient is alert and oriented x 3.  SKIN: No obvious rash, lesion, or ulcer.    DATA REVIEW:   CBC Recent Labs  Lab 12/09/17 2349  12/11/17 0444  WBC 5.0  --   --   HGB 10.8*   < > 11.1*  HCT 32.6*  --   --   PLT 219  --   --    < > = values in this interval not displayed.    Chemistries  Recent Labs  Lab 12/09/17 1135  12/09/17 2349  NA 138  --  138  K 4.4  --  3.5  CL 107  --  105  CO2 25  --  24  GLUCOSE 91   < > 104*  BUN 27*  --  28*  CREATININE 1.59*  --  1.50*  CALCIUM 9.0  --  8.6*  AST 33  --   --   ALT 14  --   --   ALKPHOS 266*  --   --   BILITOT 0.5  --   --    < > = values in this interval not displayed.    Cardiac Enzymes No results for input(s): TROPONINI in the last 168 hours.  Microbiology Results  Results for orders placed or performed during the hospital encounter of 12/09/17  MRSA PCR Screening     Status: None   Collection Time: 12/09/17  5:10 AM  Result Value Ref Range Status   MRSA by PCR NEGATIVE NEGATIVE Final    Comment:  The GeneXpert MRSA Assay (FDA approved for NASAL specimens only), is one component of a comprehensive MRSA colonization surveillance program. It is not intended to diagnose MRSA infection nor to guide or monitor treatment for MRSA infections. Performed at  [facility], [address], [place], [place] [address]     RADIOLOGY:  Dg Chest Portable 1 View  Result Date: 12/09/2017 CLINICAL DATA:  Weakness.  Left shoulder pain. EXAM: PORTABLE CHEST 1 VIEW COMPARISON:  Single-view of the chest 09/14/2014. PA and lateral chest 08/20/2014. FINDINGS: The lungs are clear. Heart size is mildly enlarged. Aortic atherosclerosis is noted. No pneumothorax or pleural effusion. No acute bony abnormality. IMPRESSION: No acute disease. Atherosclerosis. Electronically Signed   By: Inge Rise M.D.   On: 12/09/2017 12:49   Dg Humerus Left  Result Date: 12/09/2017 CLINICAL DATA:  Weakness with left shoulder pain. EXAM: LEFT HUMERUS - 2+ VIEW COMPARISON:  None. FINDINGS: Minimal spurring at the acromioclavicular joint without joint narrowing. Negative glenohumeral joint. No evidence of fracture or bone lesion. No soft tissue calcification. Minimal spurring at the olecranon and medial elbow joint. IMPRESSION: No acute or focal finding. Electronically Signed   By: Monte Fantasia M.D.   On: 12/09/2017 12:48      Management plans discussed with the patient, family and they are in agreement.  CODE STATUS:     Code Status Orders  (From admission, onward)        Start     Ordered   12/09/17 1646  Do not attempt resuscitation (DNR)  Continuous    Question Answer Comment  In the event of cardiac or respiratory ARREST Do not call a "code blue"   In the event of cardiac or respiratory ARREST Do not perform Intubation, CPR, defibrillation or ACLS   In the event of cardiac or respiratory ARREST Use medication by any route, position, wound care, and other measures to relive pain and suffering. May use oxygen, suction and manual treatment of airway obstruction as needed for comfort.      12/09/17 1646  Advance Directive Documentation     Most Recent Value  Type of Advance Directive  Healthcare Power of Attorney  Pre-existing out of facility DNR order  (yellow form or pink MOST form)  -  "MOST" Form in Place?  -      TOTAL TIME TAKING CARE OF THIS PATIENT: 40 minutes.    Henreitta Leber M.D on 12/11/2017 at 11:01 AM  Between 7am to 6pm - Pager - [phone]  After 6pm go to [email or web address] - Technical brewer [facility]  Office  [phone]  CC: Primary care physician; Rosaland Lao, NP

## 2017-12-11 NOTE — Discharge Instructions (Signed)
Gastrointestinal Bleeding °Gastrointestinal bleeding is bleeding somewhere along the path food travels through the body (digestive tract). This path is anywhere between the mouth and the opening of the butt (anus). You may have blood in your poop (stools) or have black poop. If you throw up (vomit), there may be blood in it. °This condition can be mild, serious, or even life-threatening. If you have a lot of bleeding, you may need to stay in the hospital. °Follow these instructions at home: °· Take over-the-counter and prescription medicines only as told by your doctor. °· Eat foods that have a lot of fiber in them. These foods include whole grains, fruits, and vegetables. You can also try eating 1-3 prunes each day. °· Drink enough fluid to keep your pee (urine) clear or pale yellow. °· Keep all follow-up visits as told by your doctor. This is important. °Contact a doctor if: °· Your symptoms do not get better. °Get help right away if: °· Your bleeding gets worse. °· You feel dizzy or you pass out (faint). °· You feel weak. °· You have very bad cramps in your back or belly (abdomen). °· You pass large clumps of blood (clots) in your poop. °· Your symptoms are getting worse. °This information is not intended to replace advice given to you by your health care provider. Make sure you discuss any questions you have with your health care provider. °Document Released: 05/13/2008 Document Revised: 01/10/2016 Document Reviewed: 01/22/2015 °Elsevier Interactive Patient Education © 2018 [employer] ° °

## 2017-12-24 ENCOUNTER — Ambulatory Visit: Payer: Medicare Other | Admitting: Gastroenterology

## 2018-02-03 ENCOUNTER — Ambulatory Visit: Payer: Medicare Other | Admitting: Gastroenterology

## 2018-02-04 ENCOUNTER — Ambulatory Visit: Payer: Medicare Other | Admitting: Gastroenterology

## 2018-02-10 ENCOUNTER — Ambulatory Visit (INDEPENDENT_AMBULATORY_CARE_PROVIDER_SITE_OTHER): Payer: Medicare Other | Admitting: Gastroenterology

## 2018-02-10 ENCOUNTER — Encounter: Payer: Self-pay | Admitting: Gastroenterology

## 2018-02-10 ENCOUNTER — Encounter

## 2018-02-10 ENCOUNTER — Encounter (INDEPENDENT_AMBULATORY_CARE_PROVIDER_SITE_OTHER): Payer: Self-pay

## 2018-02-10 VITALS — BP 99/61 | HR 57 | Ht 60.0 in | Wt 189.0 lb

## 2018-02-10 DIAGNOSIS — K625 Hemorrhage of anus and rectum: Secondary | ICD-10-CM

## 2018-02-10 DIAGNOSIS — J439 Emphysema, unspecified: Secondary | ICD-10-CM | POA: Insufficient documentation

## 2018-02-10 DIAGNOSIS — IMO0002 Reserved for concepts with insufficient information to code with codable children: Secondary | ICD-10-CM | POA: Insufficient documentation

## 2018-02-10 DIAGNOSIS — H25019 Cortical age-related cataract, unspecified eye: Secondary | ICD-10-CM | POA: Insufficient documentation

## 2018-02-10 DIAGNOSIS — H409 Unspecified glaucoma: Secondary | ICD-10-CM | POA: Insufficient documentation

## 2018-02-10 DIAGNOSIS — C649 Malignant neoplasm of unspecified kidney, except renal pelvis: Secondary | ICD-10-CM | POA: Insufficient documentation

## 2018-02-10 NOTE — Patient Instructions (Signed)
Ms. Lineman is to complete the labwork ordered.   Lab order was given to accompanying healthcare worker.   Office noted printed and attached to Home documents.

## 2018-02-10 NOTE — Progress Notes (Signed)
Jonathon Bellows MD, MRCP(U.K) [address]  [place], [place] [address]  Main: [phone]  Fax: [phone]   Primary Care Physician: Rosaland Lao, NP  Primary Gastroenterologist:  Dr. Jonathon Bellows   Chief Complaint  Patient presents with  . Hospitalization Follow-up    HPI: [name] is a 84 y.o. female     .Summary of history :  She is here today to see me for a hospital follow up . She was admitted on 12/09/17 with a GI bleed. She has a history of multiple co morbid conditions such as CVA, asthma , CKD, HTN, diabetes lives in assisted living and was admitted to the  hospital for rectal bleeding . Hb at baseliine on admission . She had  been on Plavix.  Last colonoscopy she recollects was many years back. Presently no complaints.  My impression was that she had a divcerticular bleed and since she was on Plavix no procedure could be done for 5 days of plavix. She was discharged as she was stable     Interval history   12/10/2017-  02/10/2018    She is doing well , no bleeding . She is not on Plavix. No complaints    Current Outpatient Medications  Medication Sig Dispense Refill  . acetaminophen (TYLENOL) 325 MG tablet Take 650 mg by mouth every 4 (four) hours as needed for mild pain.     Marland Kitchen allopurinol (ZYLOPRIM) 100 MG tablet Take 100 mg by mouth daily.    Marland Kitchen amitriptyline (ELAVIL) 25 MG tablet Take 12.5 mg by mouth 2 (two) times daily.     Marland Kitchen aspirin EC 81 MG EC tablet Take 1 tablet (81 mg total) by mouth daily. 30 tablet 0  . Cholecalciferol (VITAMIN D-3) 1000 UNITS CAPS Take 2,000 Units by mouth daily.     . DULoxetine (CYMBALTA) 30 MG capsule Take 30 mg by mouth every morning.    . furosemide (LASIX) 20 MG tablet Take 1 tablet (20 mg total) by mouth every other day. (Patient taking differently: Take 40 mg by mouth daily. ) 30 tablet 0  . gabapentin (NEURONTIN) 400 MG capsule Take 400 mg by mouth 2 (two) times daily.     Marland Kitchen gabapentin (NEURONTIN)  600 MG tablet Take 900 mg by mouth at bedtime.     Marland Kitchen latanoprost (XALATAN) 0.005 % ophthalmic solution Place 1 drop into both eyes at bedtime.    Marland Kitchen levothyroxine (SYNTHROID, LEVOTHROID) 25 MCG tablet Take 25 mcg by mouth daily before breakfast.    . metoprolol tartrate (LOPRESSOR) 25 MG tablet Take 12.5 mg by mouth 2 (two) times daily.    . pantoprazole (PROTONIX) 40 MG tablet Take 40 mg by mouth 2 (two) times daily.     Marland Kitchen topiramate (TOPAMAX) 25 MG tablet Take 25 mg by mouth at bedtime.    . traMADol (ULTRAM) 50 MG tablet Take 50 mg by mouth 3 (three) times daily. And every 8 hours as needed for pain control    . ondansetron (ZOFRAN) 4 MG tablet Take 4 mg by mouth every 8 (eight) hours as needed for nausea or vomiting.    . senna (SENOKOT) 8.6 MG tablet Take 1 tablet by mouth 3 (three) times daily as needed for constipation.     No current facility-administered medications for this visit.     Allergies as of 02/10/2018  . (No Known Allergies)    ROS:  General: Negative for anorexia, weight loss, fever, chills, fatigue, weakness.  ENT: Negative for hoarseness, difficulty swallowing , nasal congestion. CV: Negative for chest pain, angina, palpitations, dyspnea on exertion, peripheral edema.  Respiratory: Negative for dyspnea at rest, dyspnea on exertion, cough, sputum, wheezing.  GI: See history of present illness. GU:  Negative for dysuria, hematuria, urinary incontinence, urinary frequency, nocturnal urination.  Endo: Negative for unusual weight change.    Physical Examination:   BP 99/61 (BP Location: Left Arm, Patient Position: Sitting, Cuff Size: Large)   Pulse (!) 57   Ht 5' (1.524 m)   Wt 189 lb (85.7 kg)   LMP  (LMP Unknown)   HC 4" (10.2 cm)   BMI 36.91 kg/m   General: in wheel chair with helper  Eyes: No icterus. Conjunctivae pink. Mouth: Oropharyngeal mucosa moist and pink , no lesions erythema or exudate. Lungs: Clear to auscultation bilaterally.  Non-labored. Heart: Regular rate and rhythm, no murmurs rubs or gallops.  Abdomen: Bowel sounds are normal, nontender, nondistended, no hepatosplenomegaly or masses, no abdominal bruits or hernia , no rebound or guarding.   Extremities: pedal edema  Neuro: Alert and oriented x 3.  Grossly intact. Skin: Warm and dry, no jaundice.   Psych: Alert and cooperative, normal mood and affect.   Imaging Studies: No results found.  Assessment and Plan:   [name] is a 84 y.o. y/o female with multiple comorbidity . She was admitted in 10/2017 for rectal bleeding while on Plavix. Felt to be diverticular . She did not have a colonoscopy during hospitalization. She is here today for follow up . No further bleeding . Discussed options of colonoscopy with anesthesia and associated risks including but not limited to cardiac, pulmonary and perforations, bleeding vs not doing anything and checking her Hb and if stable watch closely . She chose no further procedures as of now and happy to have her blood counts monitored closely.    Plan  1. Check CBC today to ensure it is stable.   Dr Jonathon Bellows  MD,MRCP [facility]) Follow up in PRN  .

## 2018-02-11 NOTE — Progress Notes (Signed)
MRN : [id number]  [name] is a 84 y.o. (Jul 28, 1928) female who presents with chief complaint of No chief complaint on file. Marland Kitchen  History of Present Illness:   The patient returns to the office for followup and review status post angiogram with intervention. Angiogram right lower extremity on 10/07/2016 with PTA of the SFA and crosser atherectomy of the peroneal with PTA.  The patient notes deterioration of her leg symptoms.  She is now experiencing rest pain symptoms.  There are new ulcers that have occurred since the last visit on the right and left second toes.  She notes the right leg hurts more than the left.  She also notes she is having increasing difficulty with her walking.  Her edema is at baseline  There have been no significant changes to the patient's overall health care.  The patient denies amaurosis fugax or recent TIA symptoms. There are no recent neurological changes noted. The patient denies history of DVT, PE or superficial thrombophlebitis. The patient denies recent episodes of angina or shortness of breath.   Last from 01/2017 ABI's Rt=0.69 and Lt=0.94  (previous ABI's Rt=1.06 and Lt=1.17)    No outpatient medications have been marked as taking for the 02/15/18 encounter (Appointment) with Delana Meyer, Dolores Lory, MD.    Past Medical History:  Diagnosis Date  . Abnormal glucose   . Anxiety   . Arthritis   . Asthma   . Calculus of gallbladder without mention of cholecystitis, with obstruction   . Cancer ([place])    kidney  . Candidiasis of the esophagus   . Cataract cortical, senile   . Chronic kidney disease, stage III (moderate) (HCC)   . Chronic pain   . Chronic pain syndrome   . Corns and callosities   . Debility   . Depression   . Dermatophytosis of nail   . Diabetes mellitus without complication ([place])    unspecified, without mention of complication  . Difficult intubation   . Dysphagia   . Dyspnea    with exertion  . Edema, unspecified   .  Emphysema lung ([place])   . Emphysema of lung ([place])   . Esophageal reflux   . Essential hypertension, benign   . Generalized atherosclerosis   . Glaucoma (increased eye pressure)   . Gout, unspecified   . Hemiplegia ([place])   . Hereditary and idiopathic peripheral neuropathy   . Hyperglycemia   . Hyperlipidemia   . Hypertension   . Hypoglycemia, unspecified   . Hypopotassemia   . Hypothyroidism   . Migraine, unspecified, without mention of intractable migraine without mention of status migrainosus   . Other vitamin B12 deficiency anemia   . Pain in limb   . Peripheral vascular disease ([place])   . Personal history of fall   . Stroke ([place])   . Thyroid disease   . Ulcer of foot ([place])   . Unspecified asthma(493.90)   . Unspecified constipation   . Unspecified hypertensive heart disease with heart failure(402.91)   . Unspecified urinary incontinence   . Unspecified vitamin D deficiency     Past Surgical History:  Procedure Laterality Date  . ABDOMINAL HYSTERECTOMY    . arterial thrombi stents in both legs    . BACK SURGERY    . BACK SURGERY    . CATARACT EXTRACTION    . COLONOSCOPY    . ESOPHAGOGASTRODUODENOSCOPY (EGD) WITH PROPOFOL N/A 06/19/2016   Procedure: ESOPHAGOGASTRODUODENOSCOPY (EGD) WITH PROPOFOL;  Surgeon: Lollie Sails, MD;  Location: [facility] ENDOSCOPY;  Service: Endoscopy;  Laterality: N/A;  . ESOPHAGOGASTRODUODENOSCOPY (EGD) WITH PROPOFOL N/A 04/10/2017   Procedure: ESOPHAGOGASTRODUODENOSCOPY (EGD) WITH PROPOFOL;  Surgeon: Lollie Sails, MD;  Location: [facility] ENDOSCOPY;  Service: Endoscopy;  Laterality: N/A;  . EYE SURGERY     cataract extraction  . LOWER EXTREMITY ANGIOGRAPHY Right 10/07/2016   Procedure: Lower Extremity Angiography;  Surgeon: Katha Cabal, MD;  Location: [facility];  Service: Cardiovascular;  Laterality: Right;  . PERIPHERAL VASCULAR CATHETERIZATION Left 09/02/2016   Procedure: Lower Extremity Angiography;  Surgeon: Katha Cabal,  MD;  Location: [facility];  Service: Cardiovascular;  Laterality: Left;  . PERIPHERAL VASCULAR CATHETERIZATION Right 09/16/2016   Procedure: Lower Extremity Angiography;  Surgeon: Katha Cabal, MD;  Location: [facility];  Service: Cardiovascular;  Laterality: Right;    Social History Social History   Tobacco Use  . Smoking status: Never Smoker  . Smokeless tobacco: Never Used  Substance Use Topics  . Alcohol use: No  . Drug use: No    Family History Family History  Problem Relation Age of Onset  . Cancer Father   . Stroke Father   . Seizures Mother   . Lung cancer Sister   . Bone cancer Sister     No Known Allergies   REVIEW OF SYSTEMS (Negative unless checked)  Constitutional: [] Weight loss  [] Fever  [] Chills Cardiac: [] Chest pain   [] Chest pressure   [] Palpitations   [] Shortness of breath when laying flat   [x] Shortness of breath with exertion. Vascular:  [x] Pain in legs with walking   [] Pain in legs at rest  [] History of DVT   [] Phlebitis   [x] Swelling in legs   [] Varicose veins   [x] Non-healing ulcers Pulmonary:   [] Uses home oxygen   [] Productive cough   [] Hemoptysis   [] Wheeze  [] COPD   [] Asthma Neurologic:  [] Dizziness   [] Seizures   [] History of stroke   [] History of TIA  [] Aphasia   [] Vissual changes   [] Weakness or numbness in arm   [] Weakness or numbness in leg Musculoskeletal:   [] Joint swelling   [] Joint pain   [] Low back pain Hematologic:  [] Easy bruising  [] Easy bleeding   [] Hypercoagulable state   [] Anemic Gastrointestinal:  [] Diarrhea   [] Vomiting  [] Gastroesophageal reflux/heartburn   [] Difficulty swallowing. Genitourinary:  [x] Chronic kidney disease   [] Difficult urination  [] Frequent urination   [] Blood in urine Skin:  [] Rashes   [] Ulcers  Psychological:  [] History of anxiety   []  History of major depression.  Physical Examination  There were no vitals filed for this visit. There is no height or weight on file to calculate  BMI. Gen: WD/WN, NAD Head: [place]/AT, No temporalis wasting.  Ear/Nose/Throat: Hearing grossly intact, nares w/o erythema or drainage Eyes: PER, EOMI, sclera nonicteric.  Neck: Supple, no large masses.   Pulmonary:  Good air movement, no audible wheezing bilaterally, no use of accessory muscles.  Cardiac: RRR, no JVD Vascular: 3+ edema bilaterally with moderate venous stasis changes dry ulceration dorsal surface of the right and left second toes.  No drainage no odor no obvious cellulitis Vessel Right Left  Radial Palpable Palpable  Popliteal Not Palpable Not Palpable  PT Not Palpable Not Palpable  DP Not Palpable Not Palpable  Gastrointestinal: Non-distended. No guarding/no peritoneal signs.  Musculoskeletal: M/S 5/5 throughout.  No deformity or atrophy.  Neurologic: CN 2-12 intact. Symmetrical.  Speech is fluent. Motor exam as listed above. Psychiatric: Judgment intact, Mood &  affect appropriate for pt's clinical situation. Dermatologic: Moderate venous rashes with ulcers noted.  No changes consistent with cellulitis. Lymph : No lichenification or skin changes of chronic lymphedema.  CBC Lab Results  Component Value Date   WBC 5.0 12/09/2017   HGB 11.1 (L) 12/11/2017   HCT 32.6 (L) 12/09/2017   MCV 92.5 12/09/2017   PLT 219 12/09/2017    BMET    Component Value Date/Time   NA 138 12/09/2017 2349   NA 139 09/22/2014 0521   K 3.5 12/09/2017 2349   K 4.4 09/22/2014 0521   CL 105 12/09/2017 2349   CL 109 (H) 09/22/2014 0521   CO2 24 12/09/2017 2349   CO2 24 09/22/2014 0521   GLUCOSE 104 (H) 12/09/2017 2349   GLUCOSE 83 09/22/2014 0521   BUN 28 (H) 12/09/2017 2349   BUN 9 09/22/2014 0521   CREATININE 1.50 (H) 12/09/2017 2349   CREATININE 0.97 09/22/2014 0521   CALCIUM 8.6 (L) 12/09/2017 2349   CALCIUM 9.0 09/22/2014 0521   GFRNONAA 30 (L) 12/09/2017 2349   GFRNONAA 58 (L) 09/22/2014 0521   GFRNONAA 43 (L) 02/27/2013 1139   GFRAA 35 (L) 12/09/2017 2349   GFRAA >60  09/22/2014 0521   GFRAA 50 (L) 02/27/2013 1139   CrCl cannot be calculated (Patient's most recent lab result is older than the maximum 21 days allowed.).  COAG Lab Results  Component Value Date   INR 1.08 05/02/2017   INR 1.12 07/20/2016   INR 1.04 06/19/2016    Radiology No results found.  Assessment/Plan 1. Atherosclerosis of native arteries of the extremities with ulceration ([place])  Recommend:  The patient has evidence of severe atherosclerotic changes of both lower extremities associated with ulceration and tissue loss of both feet.  Right leg is more symptomatic than the left.  This represents a limb threatening ischemia and places the patient at the risk for limb loss.  Patient should undergo angiography of the right lower extremity and then subsequently the left lower extremity can be scheduled with the hope for intervention for limb salvage.  The risks and benefits as well as the alternative therapies was discussed in detail with the patient.  All questions were answered.  Patient agrees to proceed with angiography.  The patient will follow up with me in the office after the procedure.    A total of 35 minutes was spent with this patient and greater than 50% was spent in counseling and coordination of care with the patient.  Discussion included the treatment options for vascular disease including indications for surgery and intervention.  Also discussed is the appropriate timing of treatment.  In addition medical therapy was discussed.  2. Chronic venous insufficiency  No surgery or intervention at this point in time.    I have reviewed my previous discussion with the patient regarding swelling and why it  causes symptoms.  The patient is doing well with compression and will continue wearing graduated compression stockings class 1 (20-30 mmHg) on a daily basis a prescription was given. The patient will  continue wearing the stockings first thing in the morning and removing them  in the evening. The patient is instructed specifically not to sleep in the stockings.    In addition, behavioral modification including elevation during the day and exercise will be continued.    Patient should follow-up on an annual basis   3. Lymphedema  No surgery or intervention at this point in time.    I have reviewed  my previous discussion with the patient regarding swelling and why it  causes symptoms.  The patient is doing well with compression and will continue wearing graduated compression stockings class 1 (20-30 mmHg) on a daily basis a prescription was given. The patient will  continue wearing the stockings first thing in the morning and removing them in the evening. The patient is instructed specifically not to sleep in the stockings.    In addition, behavioral modification including elevation during the day and exercise will be continued.    Patient should follow-up on an annual basis   4. Essential hypertension Continue antihypertensive medications as already ordered, these medications have been reviewed and there are no changes at this time.   5. Pulmonary emphysema, unspecified emphysema type ([place]) Continue pulmonary medications and aerosols as already ordered, these medications have been reviewed and there are no changes at this time.    6. Mixed hyperlipidemia Continue statin as ordered and reviewed, no changes at this time     Hortencia Pilar, MD  02/11/2018 5:19 PM

## 2018-02-15 ENCOUNTER — Encounter (INDEPENDENT_AMBULATORY_CARE_PROVIDER_SITE_OTHER): Payer: Self-pay | Admitting: Vascular Surgery

## 2018-02-15 ENCOUNTER — Encounter (INDEPENDENT_AMBULATORY_CARE_PROVIDER_SITE_OTHER): Payer: Self-pay

## 2018-02-15 ENCOUNTER — Ambulatory Visit (INDEPENDENT_AMBULATORY_CARE_PROVIDER_SITE_OTHER): Payer: Medicare Other | Admitting: Vascular Surgery

## 2018-02-15 VITALS — BP 147/70 | HR 73 | Resp 16 | Ht 64.0 in | Wt 187.6 lb

## 2018-02-15 DIAGNOSIS — E782 Mixed hyperlipidemia: Secondary | ICD-10-CM

## 2018-02-15 DIAGNOSIS — I10 Essential (primary) hypertension: Secondary | ICD-10-CM

## 2018-02-15 DIAGNOSIS — I872 Venous insufficiency (chronic) (peripheral): Secondary | ICD-10-CM | POA: Diagnosis not present

## 2018-02-15 DIAGNOSIS — I890 Lymphedema, not elsewhere classified: Secondary | ICD-10-CM | POA: Diagnosis not present

## 2018-02-15 DIAGNOSIS — J439 Emphysema, unspecified: Secondary | ICD-10-CM

## 2018-02-15 DIAGNOSIS — I7025 Atherosclerosis of native arteries of other extremities with ulceration: Secondary | ICD-10-CM

## 2018-02-16 ENCOUNTER — Encounter (INDEPENDENT_AMBULATORY_CARE_PROVIDER_SITE_OTHER): Payer: Self-pay | Admitting: Vascular Surgery

## 2018-02-22 ENCOUNTER — Other Ambulatory Visit (INDEPENDENT_AMBULATORY_CARE_PROVIDER_SITE_OTHER): Payer: Self-pay | Admitting: Vascular Surgery

## 2018-02-23 ENCOUNTER — Ambulatory Visit
Admission: RE | Admit: 2018-02-23 | Discharge: 2018-02-23 | Disposition: A | Discharge status: Nursing Home (Includes ICF) | Payer: Medicare Other | Source: Ambulatory Visit | Attending: Vascular Surgery | Admitting: Vascular Surgery

## 2018-02-23 ENCOUNTER — Encounter: Payer: Self-pay | Admitting: *Deleted

## 2018-02-23 ENCOUNTER — Encounter
Admission: RE | Disposition: A | Discharge status: Nursing Home (Includes ICF) | Payer: Self-pay | Source: Ambulatory Visit | Attending: Vascular Surgery

## 2018-02-23 DIAGNOSIS — E785 Hyperlipidemia, unspecified: Secondary | ICD-10-CM | POA: Insufficient documentation

## 2018-02-23 DIAGNOSIS — E039 Hypothyroidism, unspecified: Secondary | ICD-10-CM | POA: Insufficient documentation

## 2018-02-23 DIAGNOSIS — L97511 Non-pressure chronic ulcer of other part of right foot limited to breakdown of skin: Secondary | ICD-10-CM | POA: Diagnosis not present

## 2018-02-23 DIAGNOSIS — I890 Lymphedema, not elsewhere classified: Secondary | ICD-10-CM | POA: Insufficient documentation

## 2018-02-23 DIAGNOSIS — Z85528 Personal history of other malignant neoplasm of kidney: Secondary | ICD-10-CM | POA: Diagnosis not present

## 2018-02-23 DIAGNOSIS — I872 Venous insufficiency (chronic) (peripheral): Secondary | ICD-10-CM | POA: Insufficient documentation

## 2018-02-23 DIAGNOSIS — Z9889 Other specified postprocedural states: Secondary | ICD-10-CM | POA: Insufficient documentation

## 2018-02-23 DIAGNOSIS — N183 Chronic kidney disease, stage 3 (moderate): Secondary | ICD-10-CM | POA: Diagnosis not present

## 2018-02-23 DIAGNOSIS — Z809 Family history of malignant neoplasm, unspecified: Secondary | ICD-10-CM | POA: Insufficient documentation

## 2018-02-23 DIAGNOSIS — L97521 Non-pressure chronic ulcer of other part of left foot limited to breakdown of skin: Secondary | ICD-10-CM | POA: Diagnosis not present

## 2018-02-23 DIAGNOSIS — I509 Heart failure, unspecified: Secondary | ICD-10-CM | POA: Insufficient documentation

## 2018-02-23 DIAGNOSIS — Z955 Presence of coronary angioplasty implant and graft: Secondary | ICD-10-CM | POA: Diagnosis not present

## 2018-02-23 DIAGNOSIS — Z8673 Personal history of transient ischemic attack (TIA), and cerebral infarction without residual deficits: Secondary | ICD-10-CM | POA: Diagnosis not present

## 2018-02-23 DIAGNOSIS — E1139 Type 2 diabetes mellitus with other diabetic ophthalmic complication: Secondary | ICD-10-CM | POA: Insufficient documentation

## 2018-02-23 DIAGNOSIS — I130 Hypertensive heart and chronic kidney disease with heart failure and stage 1 through stage 4 chronic kidney disease, or unspecified chronic kidney disease: Secondary | ICD-10-CM | POA: Insufficient documentation

## 2018-02-23 DIAGNOSIS — I7025 Atherosclerosis of native arteries of other extremities with ulceration: Secondary | ICD-10-CM | POA: Diagnosis present

## 2018-02-23 DIAGNOSIS — E11621 Type 2 diabetes mellitus with foot ulcer: Secondary | ICD-10-CM | POA: Diagnosis not present

## 2018-02-23 DIAGNOSIS — M1990 Unspecified osteoarthritis, unspecified site: Secondary | ICD-10-CM | POA: Insufficient documentation

## 2018-02-23 DIAGNOSIS — Z9849 Cataract extraction status, unspecified eye: Secondary | ICD-10-CM | POA: Insufficient documentation

## 2018-02-23 DIAGNOSIS — E1165 Type 2 diabetes mellitus with hyperglycemia: Secondary | ICD-10-CM | POA: Insufficient documentation

## 2018-02-23 DIAGNOSIS — E1122 Type 2 diabetes mellitus with diabetic chronic kidney disease: Secondary | ICD-10-CM | POA: Diagnosis not present

## 2018-02-23 DIAGNOSIS — Z808 Family history of malignant neoplasm of other organs or systems: Secondary | ICD-10-CM | POA: Insufficient documentation

## 2018-02-23 DIAGNOSIS — Z90710 Acquired absence of both cervix and uterus: Secondary | ICD-10-CM | POA: Insufficient documentation

## 2018-02-23 DIAGNOSIS — I739 Peripheral vascular disease, unspecified: Secondary | ICD-10-CM

## 2018-02-23 DIAGNOSIS — G8190 Hemiplegia, unspecified affecting unspecified side: Secondary | ICD-10-CM | POA: Insufficient documentation

## 2018-02-23 DIAGNOSIS — I70238 Atherosclerosis of native arteries of right leg with ulceration of other part of lower right leg: Secondary | ICD-10-CM | POA: Diagnosis not present

## 2018-02-23 DIAGNOSIS — H42 Glaucoma in diseases classified elsewhere: Secondary | ICD-10-CM | POA: Insufficient documentation

## 2018-02-23 DIAGNOSIS — Z820 Family history of epilepsy and other diseases of the nervous system: Secondary | ICD-10-CM | POA: Insufficient documentation

## 2018-02-23 DIAGNOSIS — J439 Emphysema, unspecified: Secondary | ICD-10-CM | POA: Diagnosis not present

## 2018-02-23 DIAGNOSIS — Z801 Family history of malignant neoplasm of trachea, bronchus and lung: Secondary | ICD-10-CM | POA: Insufficient documentation

## 2018-02-23 DIAGNOSIS — Z823 Family history of stroke: Secondary | ICD-10-CM | POA: Insufficient documentation

## 2018-02-23 HISTORY — PX: LOWER EXTREMITY ANGIOGRAPHY: CATH118251

## 2018-02-23 LAB — POTASSIUM: POTASSIUM: 5.3 mmol/L — AB (ref 3.5–5.1)

## 2018-02-23 LAB — CREATININE, SERUM
CREATININE: 1.47 mg/dL — AB (ref 0.44–1.00)
GFR calc Af Amer: 36 mL/min — ABNORMAL LOW (ref >60)
GFR calc non Af Amer: 31 mL/min — ABNORMAL LOW (ref >60)

## 2018-02-23 LAB — BUN: BUN: 17 mg/dL (ref 8–23)

## 2018-02-23 SURGERY — LOWER EXTREMITY ANGIOGRAPHY
Anesthesia: Moderate Sedation | Laterality: Right

## 2018-02-23 MED ORDER — FENTANYL CITRATE (PF) 100 MCG/2ML IJ SOLN
INTRAMUSCULAR | Status: AC
  Filled 2018-02-23: qty 2

## 2018-02-23 MED ORDER — FENTANYL CITRATE (PF) 100 MCG/2ML IJ SOLN
INTRAMUSCULAR | Status: DC | PRN
  Administered 2018-02-23 (×2): 25 ug via INTRAVENOUS
  Administered 2018-02-23: 50 ug via INTRAVENOUS

## 2018-02-23 MED ORDER — HYDRALAZINE HCL 20 MG/ML IJ SOLN: 5.0000 mg | INTRAMUSCULAR | Status: DC | PRN

## 2018-02-23 MED ORDER — SODIUM CHLORIDE 0.9 % IV BOLUS
INTRAVENOUS | Status: AC | PRN
  Administered 2018-02-23: 500 mL via INTRAVENOUS

## 2018-02-23 MED ORDER — FAMOTIDINE 20 MG PO TABS: 40.0000 mg | ORAL_TABLET | ORAL | Status: DC | PRN

## 2018-02-23 MED ORDER — ONDANSETRON HCL 4 MG/2ML IJ SOLN: 4.0000 mg | Freq: Four times a day (QID) | INTRAMUSCULAR | Status: DC | PRN

## 2018-02-23 MED ORDER — CEFAZOLIN SODIUM-DEXTROSE 2-4 GM/100ML-% IV SOLN: 2.0000 g | Freq: Once | INTRAVENOUS | Status: DC

## 2018-02-23 MED ORDER — MIDAZOLAM HCL 2 MG/2ML IJ SOLN
INTRAMUSCULAR | Status: DC | PRN
  Administered 2018-02-23 (×2): 1 mg via INTRAVENOUS
  Administered 2018-02-23: 2 mg via INTRAVENOUS

## 2018-02-23 MED ORDER — MIDAZOLAM HCL 5 MG/5ML IJ SOLN
INTRAMUSCULAR | Status: AC
  Filled 2018-02-23: qty 5

## 2018-02-23 MED ORDER — CEFAZOLIN SODIUM-DEXTROSE 2-4 GM/100ML-% IV SOLN
INTRAVENOUS | Status: AC
  Filled 2018-02-23: qty 100

## 2018-02-23 MED ORDER — HYDROMORPHONE HCL 1 MG/ML IJ SOLN: 1.0000 mg | Freq: Once | INTRAMUSCULAR | Status: DC | PRN

## 2018-02-23 MED ORDER — CLOPIDOGREL BISULFATE 75 MG PO TABS: 75.0000 mg | ORAL_TABLET | Freq: Every day | ORAL | 4 refills | Status: AC

## 2018-02-23 MED ORDER — LIDOCAINE HCL (PF) 1 % IJ SOLN
INTRAMUSCULAR | Status: AC
  Filled 2018-02-23: qty 30

## 2018-02-23 MED ORDER — SODIUM CHLORIDE 0.9% FLUSH: 3.0000 mL | Freq: Two times a day (BID) | INTRAVENOUS | Status: DC

## 2018-02-23 MED ORDER — SODIUM CHLORIDE 0.9% FLUSH: 3.0000 mL | INTRAVENOUS | Status: DC | PRN

## 2018-02-23 MED ORDER — SODIUM CHLORIDE 0.9 % IV SOLN
INTRAVENOUS | Status: DC
  Administered 2018-02-23: 08:00:00 via INTRAVENOUS

## 2018-02-23 MED ORDER — LABETALOL HCL 5 MG/ML IV SOLN
INTRAVENOUS | Status: AC
  Filled 2018-02-23: qty 4

## 2018-02-23 MED ORDER — CLOPIDOGREL BISULFATE 75 MG PO TABS: 300.0000 mg | ORAL_TABLET | Freq: Once | ORAL | Status: DC

## 2018-02-23 MED ORDER — HEPARIN SODIUM (PORCINE) 1000 UNIT/ML IJ SOLN
INTRAMUSCULAR | Status: AC
  Filled 2018-02-23: qty 1

## 2018-02-23 MED ORDER — ACETAMINOPHEN 325 MG PO TABS: 650.0000 mg | ORAL_TABLET | ORAL | Status: DC | PRN

## 2018-02-23 MED ORDER — SODIUM CHLORIDE 0.9 % IV SOLN: 250.0000 mL | INTRAVENOUS | Status: DC | PRN

## 2018-02-23 MED ORDER — OXYCODONE HCL 5 MG PO TABS: 5.0000 mg | ORAL_TABLET | ORAL | Status: DC | PRN

## 2018-02-23 MED ORDER — SODIUM CHLORIDE 0.9 % IV SOLN: INTRAVENOUS | Status: DC

## 2018-02-23 MED ORDER — LABETALOL HCL 5 MG/ML IV SOLN
10.0000 mg | INTRAVENOUS | Status: DC | PRN
  Administered 2018-02-23: 10 mg via INTRAVENOUS

## 2018-02-23 MED ORDER — IOPAMIDOL (ISOVUE-300) INJECTION 61%
INTRAVENOUS | Status: DC | PRN
  Administered 2018-02-23: 70 mL via INTRAVENOUS

## 2018-02-23 MED ORDER — CLOPIDOGREL BISULFATE 75 MG PO TABS: 75.0000 mg | ORAL_TABLET | Freq: Every day | ORAL | Status: DC

## 2018-02-23 MED ORDER — HEPARIN (PORCINE) IN NACL 1000-0.9 UT/500ML-% IV SOLN
INTRAVENOUS | Status: AC
  Filled 2018-02-23: qty 1000

## 2018-02-23 MED ORDER — METHYLPREDNISOLONE SODIUM SUCC 125 MG IJ SOLR: 125.0000 mg | INTRAMUSCULAR | Status: DC | PRN

## 2018-02-23 MED ORDER — MORPHINE SULFATE (PF) 4 MG/ML IV SOLN: 2.0000 mg | INTRAVENOUS | Status: DC | PRN

## 2018-02-23 MED ORDER — HEPARIN SODIUM (PORCINE) 1000 UNIT/ML IJ SOLN
INTRAMUSCULAR | Status: DC | PRN
  Administered 2018-02-23: 4000 [IU] via INTRAVENOUS

## 2018-02-23 SURGICAL SUPPLY — 32 items
BALLN LUTONIX 018 4X100X130 (BALLOONS) ×3
BALLN STERLING OTW 3X60X150 (BALLOONS) ×3
BALLN ULTRVRSE 018 2.5X150X150 (BALLOONS) ×3
BALLN ULTRVRSE 2.5X300X150 (BALLOONS) ×3
BALLN ULTRVRSE 2X300X150 (BALLOONS) ×2
BALLN ULTRVRSE 2X300X150 OTW (BALLOONS) ×1
BALLOON LUTONIX 018 4X100X130 (BALLOONS) ×1 IMPLANT
BALLOON STERLING OTW 3X60X150 (BALLOONS) ×1 IMPLANT
BALLOON ULTRVRSE 2.5X300X150 (BALLOONS) ×1 IMPLANT
BALLOON ULTRVRSE 2X300X150 OTW (BALLOONS) ×1 IMPLANT
BALLOON ULTRVS 018 2.5X150X150 (BALLOONS) ×1 IMPLANT
CATH CROSSER S6 154CM (CATHETERS) ×3 IMPLANT
CATH CXI SUPP ANG 2.6FR 150CM (CATHETERS) ×3 IMPLANT
CATH PIG 70CM (CATHETERS) ×3 IMPLANT
CATH USHER TPER 130CM (CATHETERS) ×3 IMPLANT
CATH VERT 5FR 125CM (CATHETERS) ×3 IMPLANT
DEVICE PRESTO INFLATION (MISCELLANEOUS) ×3 IMPLANT
DEVICE STARCLOSE SE CLOSURE (Vascular Products) ×3 IMPLANT
GUIDEWIRE PFTE-COATED .018X300 (WIRE) ×3 IMPLANT
IV NS 250ML (IV SOLUTION) ×2
IV NS 250ML BAXH (IV SOLUTION) ×1 IMPLANT
KIT FLOWMATE PROCEDURAL (KITS) ×3 IMPLANT
NEEDLE ENTRY 21GA 7CM ECHOTIP (NEEDLE) ×3 IMPLANT
PACK ANGIOGRAPHY (CUSTOM PROCEDURE TRAY) ×3 IMPLANT
SHEATH ANL2 6FRX45 HC (SHEATH) ×3 IMPLANT
SHEATH BRITE TIP 5FRX11 (SHEATH) ×3 IMPLANT
SHIELD X-DRAPE GOLD 12X17 (MISCELLANEOUS) ×3 IMPLANT
SYR MEDRAD MARK V 150ML (SYRINGE) ×3 IMPLANT
TUBING CONTRAST HIGH PRESS 72 (TUBING) ×6 IMPLANT
WIRE AQUATRACK .035X260CM (WIRE) ×3 IMPLANT
WIRE G V18X300CM (WIRE) ×6 IMPLANT
WIRE J 3MM .035X145CM (WIRE) ×3 IMPLANT

## 2018-02-23 NOTE — H&P (Signed)
[facility] History & Physical Update  The patient was interviewed and re-examined.  The patient's previous History and Physical has been reviewed and is unchanged.  There is no change in the plan of care. We plan to proceed with the scheduled procedure.  Hortencia Pilar, MD  02/23/2018, 8:03 AM

## 2018-02-23 NOTE — Op Note (Signed)
[facility] Percutaneous Study/Intervention Procedural Note   Date of Surgery: 02/23/2018  Surgeon:  Katha Cabal, MD.  Pre-operative Diagnosis: Atherosclerotic occlusive disease bilateral lower extremities with ulceration of the right lower extremity   Post-operative diagnosis: Same  Procedure(s) Performed: 1. Introduction catheter into right lower extremity 3rd order catheter placement  2. Contrast injection right lower extremity for distal runoff   3. Crosser atherectomy of the right anterior tibial artery 4. Percutaneous transluminal angioplasty right anterior tibial artery with a 2.5 mm followed by a 3 mm Ultraverse balloon             5.  Percutaneous transluminal angioplasty of the popliteal artery with a 4 mm Lutonix drug-eluting balloon                      6.   Star close closure left common femoral arteriotomy                Anesthesia: Conscious sedation was administered under my direct supervision by the interventional radiology RN. IV Versed plus fentanyl were utilized. Continuous ECG, pulse oximetry and blood pressure was monitored throughout the entire procedure. Conscious sedation was for a total of 79 minutes.  Sheath: 6 [place] Ansell left common femoral  Contrast: 70 cc  Fluoroscopy Time: 13.4 minutes  Indications: [name] presents with atherosclerotic occlusive disease bilateral lower extremities. The patient has developed ulceration and gangrene of the soft tissues of the right lower extremity. This places the patient at high risk for limb loss and amputation. The risks and benefits are reviewed all questions answered patient agrees to proceed.  Procedure: [name] is a 84 y.o. y.o. female who was identified and appropriate procedural time out was performed. The patient was then placed supine on the table and prepped and draped in the usual sterile fashion.    Ultrasound was placed in the sterile sleeve and the left groin was evaluated the left common femoral artery was echolucent and pulsatile indicating patency.  Image was recorded for the permanent record and under real-time visualization a microneedle was inserted into the common femoral artery microwire followed by a micro-sheath.  A J-wire was then advanced through the micro-sheath and a  5 [place] sheath was then inserted over a J-wire. J-wire was then advanced and a 5 French pigtail catheter was positioned at the level of T12. AP projection of the aorta was then obtained. Pigtail catheter was repositioned to above the bifurcation and a LAO view of the pelvis was obtained.  Subsequently a pigtail catheter with the stiff angle Glidewire was used to cross the aortic bifurcation the catheter wire were advanced down into the right distal external iliac artery. Oblique view of the femoral bifurcation was then obtained and subsequently the wire was reintroduced and the pigtail catheter negotiated into the SFA representing third order catheter placement. Distal runoff was then performed.  5000 units of heparin was then given and allowed to circulate and a 6 [place] Ansell sheath was advanced up and over the bifurcation and positioned in the common femoral artery.  Wire and catheter were then used to negotiate the popliteal stenosis and the catheter was then positioned in the distal popliteal where magnified imaging of the trifurcation was performed.  Initial findings demonstrated a 70% stenosis in the mid popliteal with diffuse disease in the distal half of the popliteal artery.  The anterior tibial was patent for approximately 1 to 2 cm but heavily diseased and  then occludes.  There is reconstitution of the dorsalis pedis via a discongruous peroneal artery.  The tibioperoneal trunk and proximal two thirds of the peroneal are occluded the posterior tibial is occluded throughout its entire course.  The S6 Crosser  catheter was then prepped on the field and a straight Usher catheter was advanced into the cul-de-sac of the anterior tibial under magnified imaging. Using the Crosser catheter the occlusion of the anterior tibial was negotiated.  Injection of contrast confirmed intraluminal positioning within the dorsalis pedis.  A 0.018 advantage wire was then advanced.  Initially a 2 mm x 30 cm Ultraverse balloon was advanced and angioplasty was performed from the ankle to the origin of the anterior tibial.  Inflation was to 12 atm for 1 minute.  Following this a 2.5 mm x 30 cm Ultraverse balloon was advanced across the same location and inflated to 12 atm for 1 minute.  The detector was then repositioned and hand-injection of contrast demonstrated the popliteal lesion a 4 mm x 100 mm Lutonix drug-eluting balloon was then advanced across this lesion and positioned with its distal marker at the origin of the anterior tibial.  Angioplasty of the distal two thirds of the popliteal was performed to 6 atm for 1 full minute.  A 3 mm x 60 mm Ultraverse balloon was then advanced into the anterior tibial covering the origin distally and this was inflated to 12 atm for 1 minute.  Follow-up imaging from [name]'s canal down to the forefoot demonstrated wide patency of the popliteal with less than 5% residual stenosis there is near total resolution of the lesion.  The anterior tibial is now patent throughout its entirety and fills the dorsalis pedis.  There is less than 10% residual stenosis throughout this tibial vessel.  There is now in-line flow to the foot.  After review of these images the sheath is pulled into the left external iliac oblique of the common femoral is obtained and a Star close device deployed. There no immediate Complications.  Findings: The abdominal aorta is opacified with a bolus injection contrast. Renal arteries are single and patent. The aorta itself has diffuse disease but no hemodynamically significant  lesions. The common and external iliac arteries are widely patent bilaterally.  The right common femoral is widely patent as is the profunda femoris.  The SFA has mild to moderate disease there are no focal hemodynamically significant lesions.  The popliteal at the level of the tibial plateau demonstrates a 70% stenosis and there is diffuse disease throughout the remaining portion of the popliteal..  The trifurcation is heavily diseased with flush occlusion of the tibioperoneal trunk.  As noted above the distal one third of the peroneal is reconstituted by collaterals and ultimately this fills the dorsalis pedis.  Posterior tibial is occluded throughout its entire course.  The anterior tibial is patent but heavily diseased with greater than 80% stenosis for its initial 2 to 3 cm it then occludes and remains occluded down to the level of the dorsalis pedis.  Following angioplasty anterior tibial now is widely patent in-line flow and looks quite nice with less than 10% residual stenosis. Angioplasty of the popliteal with a Lutonix balloon shows an excellent result with less than 5% residual stenosis.   Disposition: Patient was taken to the recovery room in stable condition having tolerated the procedure well.  [name] 02/23/2018,11:31 AM

## 2018-02-23 NOTE — Discharge Instructions (Signed)
Angiogram An angiogram, also called angiography, is a procedure used to look at the blood vessels. In this procedure, dye is injected through a long, thin tube (catheter) into an artery. X-rays are then taken. The X-rays will show if there is a blockage or problem in a blood vessel. Tell a health care provider about:  Any allergies you have, including allergies to shellfish or contrast dye.  All medicines you are taking, including vitamins, herbs, eye drops, creams, and over-the-counter medicines.  Any problems you or family members have had with anesthetic medicines.  Any blood disorders you have.  Any surgeries you have had.  Any previous kidney problems or failure you have had.  Any medical conditions you have.  Possibility of pregnancy, if this applies. What are the risks? Generally, an angiogram is a safe procedure. However, as with any procedure, problems can occur. Possible problems include:  Injury to the blood vessels, including rupture or bleeding.  Infection or bruising at the catheter site.  Allergic reaction to the dye or contrast used.  Kidney damage from the dye or contrast used.  Blood clots that can lead to a stroke or heart attack.  What happens before the procedure?  Do not eat or drink after midnight on the night before the procedure, or as directed by your health care provider.  Ask your health care provider if you may drink enough water to take any needed medicines the morning of the procedure. What happens during the procedure?  You may be given a medicine to help you relax (sedative) before and during the procedure. This medicine is given through an IV access tube that is inserted into one of your veins.  The area where the catheter will be inserted will be washed and shaved. This is usually done in the groin but may be done in the fold of your arm (near your elbow) or in the wrist.  A medicine will be given to numb the area where the catheter will  be inserted (local anesthetic).  The catheter will be inserted with a guide wire into an artery. The catheter is guided by using a type of X-ray (fluoroscopy) to the blood vessel being examined.  Dye is then injected into the catheter, and X-rays are taken. The dye helps to show where any narrowing or blockages are located. What happens after the procedure?  If the procedure is done through the leg, you will be kept in bed lying flat for several hours. You will be instructed to not bend or cross your legs.  The insertion site will be checked frequently.  The pulse in your feet or wrist will be checked frequently.  Additional blood tests, X-rays, and electrocardiography may be done.  You may need to stay in the hospital overnight for observation. This information is not intended to replace advice given to you by your health care provider. Make sure you discuss any questions you have with your health care provider. Document Released: 05/14/2005 Document Revised: 01/16/2016 Document Reviewed: 01/05/2013 Elsevier Interactive Patient Education  2017 [place] After This sheet gives you information about how to care for yourself after your procedure. Your health care provider may also give you more specific instructions. If you have problems or questions, contact your health care provider. What can I expect after the procedure? After the procedure, it is common to have bruising and tenderness at the catheter insertion area. Follow these instructions at home: Insertion site care  Follow instructions from your  health care provider about how to take care of your insertion site. Make sure you: ? Wash your hands with soap and water before you change your bandage (dressing). If soap and water are not available, use hand sanitizer. ? Change your dressing as told by your health care provider. ? Leave stitches (sutures), skin glue, or adhesive strips in place. These skin closures  may need to stay in place for 2 weeks or longer. If adhesive strip edges start to loosen and curl up, you may trim the loose edges. Do not remove adhesive strips completely unless your health care provider tells you to do that.  Do not take baths, swim, or use a hot tub until your health care provider approves.  You may shower 24-48 hours after the procedure or as told by your health care provider. ? Gently wash the site with plain soap and water. ? Pat the area dry with a clean towel. ? Do not rub the site. This may cause bleeding.  Do not apply powder or lotion to the site. Keep the site clean and dry.  Check your insertion site every day for signs of infection. Check for: ? Redness, swelling, or pain. ? Fluid or blood. ? Warmth. ? Pus or a bad smell. Activity  Rest as told by your health care provider, usually for 1-2 days.  Do not lift anything that is heavier than 10 lbs. (4.5 kg) or as told by your health care provider.  Do not drive for 24 hours if you were given a medicine to help you relax (sedative).  Do not drive or use heavy machinery while taking prescription pain medicine. General instructions  Return to your normal activities as told by your health care provider, usually in about a week. Ask your health care provider what activities are safe for you.  If the catheter site starts bleeding, lie flat and put pressure on the site. If the bleeding does not stop, get help right away. This is a medical emergency.  Drink enough fluid to keep your urine clear or pale yellow. This helps flush the contrast dye from your body.  Take over-the-counter and prescription medicines only as told by your health care provider.  Keep all follow-up visits as told by your health care provider. This is important. Contact a health care provider if:  You have a fever or chills.  You have redness, swelling, or pain around your insertion site.  You have fluid or blood coming from your  insertion site.  The insertion site feels warm to the touch.  You have pus or a bad smell coming from your insertion site.  You have bruising around the insertion site.  You notice blood collecting in the tissue around the catheter site (hematoma). The hematoma may be painful to the touch. Get help right away if:  You have severe pain at the catheter insertion area.  The catheter insertion area swells very fast.  The catheter insertion area is bleeding, and the bleeding does not stop when you hold steady pressure on the area.  The area near or just beyond the catheter insertion site becomes pale, cool, tingly, or numb. These symptoms may represent a serious problem that is an emergency. Do not wait to see if the symptoms will go away. Get medical help right away. Call your local emergency services (911 in the [place] Do not drive yourself to the hospital. Summary  After the procedure, it is common to have bruising and tenderness at the  catheter insertion area.  After the procedure, it is important to rest and drink plenty of fluids.  Do not take baths, swim, or use a hot tub until your health care provider says it is okay to do so. You may shower 24-48 hours after the procedure or as told by your health care provider.  If the catheter site starts bleeding, lie flat and put pressure on the site. If the bleeding does not stop, get help right away. This is a medical emergency. This information is not intended to replace advice given to you by your health care provider. Make sure you discuss any questions you have with your health care provider. Document Released: 02/20/2005 Document Revised: 07/09/2016 Document Reviewed: 07/09/2016 Elsevier Interactive Patient Education  2018 [place].  Moderate Conscious Sedation, Adult, Care After These instructions provide you with information about caring for yourself after your procedure. Your health care provider may also give you more  specific instructions. Your treatment has been planned according to current medical practices, but problems sometimes occur. Call your health care provider if you have any problems or questions after your procedure. What can I expect after the procedure? After your procedure, it is common:  To feel sleepy for several hours.  To feel clumsy and have poor balance for several hours.  To have poor judgment for several hours.  To vomit if you eat too soon.  Follow these instructions at home: For at least 24 hours after the procedure:   Do not: ? Participate in activities where you could fall or become injured. ? Drive. ? Use heavy machinery. ? Drink alcohol. ? Take sleeping pills or medicines that cause drowsiness. ? Make important decisions or sign legal documents. ? Take care of children on your own.  Rest. Eating and drinking  Follow the diet recommended by your health care provider.  If you vomit: ? Drink water, juice, or soup when you can drink without vomiting. ? Make sure you have little or no nausea before eating solid foods. General instructions  Have a responsible adult stay with you until you are awake and alert.  Take over-the-counter and prescription medicines only as told by your health care provider.  If you smoke, do not smoke without supervision.  Keep all follow-up visits as told by your health care provider. This is important. Contact a health care provider if:  You keep feeling nauseous or you keep vomiting.  You feel light-headed.  You develop a rash.  You have a fever. Get help right away if:  You have trouble breathing. This information is not intended to replace advice given to you by your health care provider. Make sure you discuss any questions you have with your health care provider. Document Released: 05/25/2013 Document Revised: 01/07/2016 Document Reviewed: 11/24/2015 Elsevier Interactive Patient Education  [employer].

## 2018-03-22 ENCOUNTER — Other Ambulatory Visit (INDEPENDENT_AMBULATORY_CARE_PROVIDER_SITE_OTHER): Payer: Self-pay | Admitting: Vascular Surgery

## 2018-03-22 DIAGNOSIS — Z9862 Peripheral vascular angioplasty status: Secondary | ICD-10-CM

## 2018-03-22 DIAGNOSIS — I739 Peripheral vascular disease, unspecified: Secondary | ICD-10-CM

## 2018-03-23 ENCOUNTER — Ambulatory Visit (INDEPENDENT_AMBULATORY_CARE_PROVIDER_SITE_OTHER): Payer: Medicare Other

## 2018-03-23 ENCOUNTER — Encounter (INDEPENDENT_AMBULATORY_CARE_PROVIDER_SITE_OTHER): Payer: Self-pay | Admitting: Vascular Surgery

## 2018-03-23 ENCOUNTER — Ambulatory Visit (INDEPENDENT_AMBULATORY_CARE_PROVIDER_SITE_OTHER): Payer: Medicare Other | Admitting: Vascular Surgery

## 2018-03-23 VITALS — BP 130/68 | HR 64 | Resp 16 | Ht 64.0 in

## 2018-03-23 DIAGNOSIS — I739 Peripheral vascular disease, unspecified: Secondary | ICD-10-CM

## 2018-03-23 DIAGNOSIS — I10 Essential (primary) hypertension: Secondary | ICD-10-CM

## 2018-03-23 DIAGNOSIS — Z9862 Peripheral vascular angioplasty status: Secondary | ICD-10-CM | POA: Diagnosis not present

## 2018-03-23 DIAGNOSIS — E782 Mixed hyperlipidemia: Secondary | ICD-10-CM | POA: Diagnosis not present

## 2018-03-23 DIAGNOSIS — I83813 Varicose veins of bilateral lower extremities with pain: Secondary | ICD-10-CM

## 2018-03-23 NOTE — Progress Notes (Signed)
Subjective:    Patient ID: [name], female    DOB: 1928/01/01, 84 y.o.   MRN: [id number] Chief Complaint  Patient presents with  . Follow-up    [facility] 3wk with ABI   Patient presents for her first post procedure follow-up.  The patient that is post a right lower extremity angiogram with intervention on February 23, 2018.  The patient underwent intervention to the right popliteal and right anterior tibial arteries with angioplasty.  The patient continues to complain of weakness bilaterally.  The patient presents today wearing her compression socks.  The patient continues to complain of left lower extremity pain as well.  The patient notes that she does walk around her apartment and when she ambulates she experiences pain radiating down the left leg.  Patient feels that her symptoms have progressed to the point that she is unable to function on a daily basis and they have become lifestyle limiting. The patient denies any rest pain or ulcer formation to the bilateral lower extremity.  The patient underwent a bilateral ABI which was notable for Right: 0.96 (previous ABI 0.69), biphasic anterior tibial artery monophasic posterior tibial artery collateral flow but no flow detected in the PTA.  Dampened great toe waveforms. Left: ABI 0.85 previous ABI 0.94, biphasic anterior tibial and peroneal no waveforms detected to the posterior tibial and great toe.  Patient denies any fever, nausea or vomiting.  Review of Systems  Constitutional: Negative.   HENT: Negative.   Eyes: Negative.   Respiratory: Negative.   Cardiovascular:       Left lower extremity pain Lower extremity weakness  Gastrointestinal: Negative.   Endocrine: Negative.   Genitourinary: Negative.   Musculoskeletal: Negative.   Skin: Negative.   Allergic/Immunologic: Negative.   Neurological: Negative.   Hematological: Negative.   Psychiatric/Behavioral: Negative.       Objective:   Physical Exam  Constitutional: She is oriented to  person, place, and time. She appears well-developed and well-nourished. No distress.  HENT:  Head: Normocephalic and atraumatic.  Right Ear: External ear normal.  Left Ear: External ear normal.  Eyes: Pupils are equal, round, and reactive to light. Conjunctivae and EOM are normal.  Neck: Normal range of motion.  Cardiovascular: Normal rate, regular rhythm, normal heart sounds and intact distal pulses.  Pulses:      Radial pulses are 2+ on the right side, and 2+ on the left side.  Hard to palpate pedal pulses however the bilateral feet are warm  Pulmonary/Chest: Effort normal and breath sounds normal.  Musculoskeletal: Normal range of motion. She exhibits edema (Patient is wearing her compression socks.  There is minimal edema noted to the bilateral lower extremity ).  Neurological: She is alert and oriented to person, place, and time.  Skin: Skin is warm and dry. She is not diaphoretic.  Psychiatric: She has a normal mood and affect. Her behavior is normal. Judgment and thought content normal.  Vitals reviewed.  BP 130/68 (BP Location: Right Arm)   Pulse 64   Resp 16   Ht 5\' 4"  (1.626 m)   LMP  (LMP Unknown)   BMI 32.10 kg/m   Past Medical History:  Diagnosis Date  . Abnormal glucose   . Anxiety   . Arthritis   . Asthma   . Calculus of gallbladder without mention of cholecystitis, with obstruction   . Cancer ([place])    kidney  . Candidiasis of the esophagus   . Cataract cortical, senile   . Chronic  kidney disease, stage III (moderate) ([place])   . Chronic pain   . Chronic pain syndrome   . Corns and callosities   . Debility   . Depression   . Dermatophytosis of nail   . Diabetes mellitus without complication ([place])    unspecified, without mention of complication  . Difficult intubation   . Dysphagia   . Dyspnea    with exertion  . Edema, unspecified   . Emphysema lung ([place])   . Emphysema of lung ([place])   . Esophageal reflux   . Essential hypertension, benign   .  Generalized atherosclerosis   . Glaucoma (increased eye pressure)   . Gout, unspecified   . Hemiplegia ([place])   . Hereditary and idiopathic peripheral neuropathy   . Hyperglycemia   . Hyperlipidemia   . Hypertension   . Hypoglycemia, unspecified   . Hypopotassemia   . Hypothyroidism   . Migraine, unspecified, without mention of intractable migraine without mention of status migrainosus   . Other vitamin B12 deficiency anemia   . Pain in limb   . Peripheral vascular disease ([place])   . Personal history of fall   . Stroke ([place])   . Thyroid disease   . Ulcer of foot ([place])   . Unspecified asthma(493.90)   . Unspecified constipation   . Unspecified hypertensive heart disease with heart failure(402.91)   . Unspecified urinary incontinence   . Unspecified vitamin D deficiency    Social History   Socioeconomic History  . Marital status: Single    Spouse name: Not on file  . Number of children: Not on file  . Years of education: Not on file  . Highest education level: Not on file  Occupational History  . Occupation: retired  Scientific laboratory technician  . Financial resource strain: Not on file  . Food insecurity:    Worry: Not on file    Inability: Not on file  . Transportation needs:    Medical: Not on file    Non-medical: Not on file  Tobacco Use  . Smoking status: Never Smoker  . Smokeless tobacco: Never Used  Substance and Sexual Activity  . Alcohol use: No  . Drug use: No  . Sexual activity: Not Currently  Lifestyle  . Physical activity:    Days per week: Not on file    Minutes per session: Not on file  . Stress: Not on file  Relationships  . Social connections:    Talks on phone: Not on file    Gets together: Not on file    Attends religious service: Not on file    Active member of club or organization: Not on file    Attends meetings of clubs or organizations: Not on file    Relationship status: Not on file  . Intimate partner violence:    Fear of current or ex partner:  Not on file    Emotionally abused: Not on file    Physically abused: Not on file    Forced sexual activity: Not on file  Other Topics Concern  . Not on file  Social History Narrative  . Not on file   Past Surgical History:  Procedure Laterality Date  . ABDOMINAL HYSTERECTOMY    . arterial thrombi stents in both legs    . BACK SURGERY    . BACK SURGERY    . CATARACT EXTRACTION    . COLONOSCOPY    . ESOPHAGOGASTRODUODENOSCOPY (EGD) WITH PROPOFOL N/A 06/19/2016   Procedure: ESOPHAGOGASTRODUODENOSCOPY (EGD) WITH  PROPOFOL;  Surgeon: Lollie Sails, MD;  Location[facility] ENDOSCOPY;  Service: Endoscopy;  Laterality: N/A;  . ESOPHAGOGASTRODUODENOSCOPY (EGD) WITH PROPOFOL N/A 04/10/2017   Procedure: ESOPHAGOGASTRODUODENOSCOPY (EGD) WITH PROPOFOL;  Surgeon: Lollie Sails, MD;  Location: [facility] ENDOSCOPY;  Service: Endoscopy;  Laterality: N/A;  . EYE SURGERY     cataract extraction  . LOWER EXTREMITY ANGIOGRAPHY Right 10/07/2016   Procedure: Lower Extremity Angiography;  Surgeon: Katha Cabal, MD;  Location: [facility] CV LAB;  Service: Cardiovascular;  Laterality: Right;  . LOWER EXTREMITY ANGIOGRAPHY Right 02/23/2018   Procedure: LOWER EXTREMITY ANGIOGRAPHY;  Surgeon: Katha Cabal, MD;  Location: [place] CV LAB;  Service: Cardiovascular;  Laterality: Right;  . PERIPHERAL VASCULAR CATHETERIZATION Left 09/02/2016   Procedure: Lower Extremity Angiography;  Surgeon: Katha Cabal, MD;  Location: [place] CV LAB;  Service: Cardiovascular;  Laterality: Left;  . PERIPHERAL VASCULAR CATHETERIZATION Right 09/16/2016   Procedure: Lower Extremity Angiography;  Surgeon: Katha Cabal, MD;  Location: [place] CV LAB;  Service: Cardiovascular;  Laterality: Right;   Family History  Problem Relation Age of Onset  . Cancer Father   . Stroke Father   . Seizures Mother   . Lung cancer Sister   . Bone cancer Sister    No Known Allergies     Assessment & Plan:    Patient presents for her first post procedure follow-up.  The patient that is post a right lower extremity angiogram with intervention on February 23, 2018.  The patient underwent intervention to the right popliteal and right anterior tibial arteries with angioplasty.  The patient continues to complain of weakness bilaterally.  The patient presents today wearing her compression socks.  The patient continues to complain of left lower extremity pain as well.  The patient notes that she does walk around her apartment and when she ambulates she experiences pain radiating down the left leg.  Patient feels that her symptoms have progressed to the point that she is unable to function on a daily basis and they have become lifestyle limiting. The patient denies any rest pain or ulcer formation to the bilateral lower extremity.  The patient underwent a bilateral ABI which was notable for Right: 0.96 (previous ABI 0.69), biphasic anterior tibial artery monophasic posterior tibial artery collateral flow but no flow detected in the PTA.  Dampened great toe waveforms. Left: ABI 0.85 previous ABI 0.94, biphasic anterior tibial and peroneal no waveforms detected to the posterior tibial and great toe.  Patient denies any fever, nausea or vomiting.  1. PVD (peripheral vascular disease) ([place]) - Stable Presents status post a recent right lower extremity angiogram with intervention ABI today shows improvement in arterial flow to the right lower extremity. Left lower extremity on ABI today shows decrease in arterial blood flow as well as an increase in symptoms as per the patient.  The patient's discomfort to her left lower extremity has progressed to the point that she is unable to function on a daily basis and feels that has become lifestyle limiting. The patient is also having weakness to her bilateral lower extremity.  On her progress note for her residents we suggested the patient undergo an x-ray of the spine to assess for  degenerative joint disease. Recommend a left lower extremity with possible intervention and attempt to assess the patient's anatomy and contributing degree of peripheral artery disease.  If appropriate an attempt to revascularize the leg can be made at that time. Procedure, risks and benefits  explained to the patient All questions answered Patient wishes to proceed  2. Varicose veins of both lower extremities with pain - Stable The patient is to continue engaging conservative therapy including wearing medical grade 1 compression, elevating her legs and remaining as active as possible. Seems to be controlling her lower extremity edema.  3. Mixed hyperlipidemia - Stable Encouraged good control as its slows the progression of atherosclerotic disease  4. Essential hypertension - Stable Encouraged good control as its slows the progression of atherosclerotic disease  Current Outpatient Medications on File Prior to Visit  Medication Sig Dispense Refill  . acetaminophen (TYLENOL) 325 MG tablet Take 650 mg by mouth every 4 (four) hours as needed for mild pain.     Marland Kitchen allopurinol (ZYLOPRIM) 100 MG tablet Take 100 mg by mouth daily.    Marland Kitchen amitriptyline (ELAVIL) 25 MG tablet Take 12.5 mg by mouth 2 (two) times daily.     Marland Kitchen aspirin EC 81 MG EC tablet Take 1 tablet (81 mg total) by mouth daily. 30 tablet 0  . Cholecalciferol (VITAMIN D-3) 1000 UNITS CAPS Take 2,000 Units by mouth daily.     . clopidogrel (PLAVIX) 75 MG tablet Take 1 tablet (75 mg total) by mouth daily. 30 tablet 4  . DULoxetine (CYMBALTA) 30 MG capsule Take 30 mg by mouth every morning.    . furosemide (LASIX) 20 MG tablet Take 1 tablet (20 mg total) by mouth every other day. 30 tablet 0  . gabapentin (NEURONTIN) 400 MG capsule Take 400 mg by mouth 2 (two) times daily.     Marland Kitchen gabapentin (NEURONTIN) 600 MG tablet Take 900 mg by mouth at bedtime.     Marland Kitchen latanoprost (XALATAN) 0.005 % ophthalmic solution Place 1 drop into both eyes at bedtime.     Marland Kitchen levothyroxine (SYNTHROID, LEVOTHROID) 25 MCG tablet Take 25 mcg by mouth daily before breakfast.    . metoprolol tartrate (LOPRESSOR) 25 MG tablet Take 12.5 mg by mouth 2 (two) times daily.    . ondansetron (ZOFRAN) 4 MG tablet Take 4 mg by mouth every 8 (eight) hours as needed for nausea or vomiting.    . pantoprazole (PROTONIX) 40 MG tablet Take 40 mg by mouth 2 (two) times daily.     Marland Kitchen senna (SENOKOT) 8.6 MG tablet Take 1 tablet by mouth 3 (three) times daily as needed for constipation.    . topiramate (TOPAMAX) 25 MG tablet Take 25 mg by mouth at bedtime.    . traMADol (ULTRAM) 50 MG tablet Take 50 mg by mouth See admin instructions. Take 50 mg by mouth 3 times daily. Take 50 mg by mouth every 8 hours as needed for pain.     No current facility-administered medications on file prior to visit.    There are no Patient Instructions on file for this visit. No follow-ups on file.  Reather Steller A Anelle Parlow, PA-C

## 2018-03-24 ENCOUNTER — Telehealth (INDEPENDENT_AMBULATORY_CARE_PROVIDER_SITE_OTHER): Payer: Self-pay

## 2018-03-24 NOTE — Telephone Encounter (Signed)
Spoke with the receptionist at [facility] asking who would I need to speak with to give information regarding the patient's angiogram on 04/06/18. Per the person that answered the call I needed to give her all of the information regarding the patient, I offered to fax pre-procedure instructions but she declined that she just wanted me to give her the instructions over the phone.

## 2018-04-05 ENCOUNTER — Other Ambulatory Visit (INDEPENDENT_AMBULATORY_CARE_PROVIDER_SITE_OTHER): Payer: Self-pay | Admitting: Nurse Practitioner

## 2018-04-16 ENCOUNTER — Encounter (INDEPENDENT_AMBULATORY_CARE_PROVIDER_SITE_OTHER): Payer: Self-pay

## 2018-05-03 ENCOUNTER — Encounter (INDEPENDENT_AMBULATORY_CARE_PROVIDER_SITE_OTHER): Payer: Self-pay

## 2018-05-14 ENCOUNTER — Other Ambulatory Visit (INDEPENDENT_AMBULATORY_CARE_PROVIDER_SITE_OTHER): Payer: Self-pay | Admitting: Nurse Practitioner

## 2018-05-18 ENCOUNTER — Ambulatory Visit
Admission: RE | Admit: 2018-05-18 | Discharge: 2018-05-18 | Disposition: A | Discharge status: Home / Self Care | Payer: Medicare Other | Source: Ambulatory Visit | Attending: Vascular Surgery | Admitting: Vascular Surgery

## 2018-05-18 ENCOUNTER — Encounter
Admission: RE | Disposition: A | Discharge status: Home / Self Care | Payer: Self-pay | Source: Ambulatory Visit | Attending: Vascular Surgery

## 2018-05-18 DIAGNOSIS — L97929 Non-pressure chronic ulcer of unspecified part of left lower leg with unspecified severity: Secondary | ICD-10-CM | POA: Diagnosis not present

## 2018-05-18 DIAGNOSIS — Z90710 Acquired absence of both cervix and uterus: Secondary | ICD-10-CM | POA: Insufficient documentation

## 2018-05-18 DIAGNOSIS — Z9889 Other specified postprocedural states: Secondary | ICD-10-CM | POA: Insufficient documentation

## 2018-05-18 DIAGNOSIS — I890 Lymphedema, not elsewhere classified: Secondary | ICD-10-CM | POA: Insufficient documentation

## 2018-05-18 DIAGNOSIS — E1122 Type 2 diabetes mellitus with diabetic chronic kidney disease: Secondary | ICD-10-CM | POA: Diagnosis not present

## 2018-05-18 DIAGNOSIS — L97529 Non-pressure chronic ulcer of other part of left foot with unspecified severity: Secondary | ICD-10-CM | POA: Insufficient documentation

## 2018-05-18 DIAGNOSIS — Z823 Family history of stroke: Secondary | ICD-10-CM | POA: Insufficient documentation

## 2018-05-18 DIAGNOSIS — I70245 Atherosclerosis of native arteries of left leg with ulceration of other part of foot: Secondary | ICD-10-CM | POA: Diagnosis not present

## 2018-05-18 DIAGNOSIS — E1151 Type 2 diabetes mellitus with diabetic peripheral angiopathy without gangrene: Secondary | ICD-10-CM | POA: Diagnosis not present

## 2018-05-18 DIAGNOSIS — M1990 Unspecified osteoarthritis, unspecified site: Secondary | ICD-10-CM | POA: Diagnosis not present

## 2018-05-18 DIAGNOSIS — Z8673 Personal history of transient ischemic attack (TIA), and cerebral infarction without residual deficits: Secondary | ICD-10-CM | POA: Insufficient documentation

## 2018-05-18 DIAGNOSIS — E11621 Type 2 diabetes mellitus with foot ulcer: Secondary | ICD-10-CM | POA: Insufficient documentation

## 2018-05-18 DIAGNOSIS — Z801 Family history of malignant neoplasm of trachea, bronchus and lung: Secondary | ICD-10-CM | POA: Insufficient documentation

## 2018-05-18 DIAGNOSIS — N183 Chronic kidney disease, stage 3 (moderate): Secondary | ICD-10-CM | POA: Insufficient documentation

## 2018-05-18 DIAGNOSIS — Z808 Family history of malignant neoplasm of other organs or systems: Secondary | ICD-10-CM | POA: Insufficient documentation

## 2018-05-18 DIAGNOSIS — E782 Mixed hyperlipidemia: Secondary | ICD-10-CM | POA: Insufficient documentation

## 2018-05-18 DIAGNOSIS — J439 Emphysema, unspecified: Secondary | ICD-10-CM | POA: Diagnosis not present

## 2018-05-18 DIAGNOSIS — I872 Venous insufficiency (chronic) (peripheral): Secondary | ICD-10-CM | POA: Insufficient documentation

## 2018-05-18 DIAGNOSIS — E559 Vitamin D deficiency, unspecified: Secondary | ICD-10-CM | POA: Insufficient documentation

## 2018-05-18 DIAGNOSIS — G894 Chronic pain syndrome: Secondary | ICD-10-CM | POA: Insufficient documentation

## 2018-05-18 DIAGNOSIS — I509 Heart failure, unspecified: Secondary | ICD-10-CM | POA: Diagnosis not present

## 2018-05-18 DIAGNOSIS — Z955 Presence of coronary angioplasty implant and graft: Secondary | ICD-10-CM | POA: Insufficient documentation

## 2018-05-18 DIAGNOSIS — E039 Hypothyroidism, unspecified: Secondary | ICD-10-CM | POA: Insufficient documentation

## 2018-05-18 DIAGNOSIS — H409 Unspecified glaucoma: Secondary | ICD-10-CM | POA: Insufficient documentation

## 2018-05-18 DIAGNOSIS — Z9849 Cataract extraction status, unspecified eye: Secondary | ICD-10-CM | POA: Diagnosis not present

## 2018-05-18 DIAGNOSIS — Z85528 Personal history of other malignant neoplasm of kidney: Secondary | ICD-10-CM | POA: Insufficient documentation

## 2018-05-18 DIAGNOSIS — I130 Hypertensive heart and chronic kidney disease with heart failure and stage 1 through stage 4 chronic kidney disease, or unspecified chronic kidney disease: Secondary | ICD-10-CM | POA: Insufficient documentation

## 2018-05-18 DIAGNOSIS — Z809 Family history of malignant neoplasm, unspecified: Secondary | ICD-10-CM | POA: Insufficient documentation

## 2018-05-18 DIAGNOSIS — G8190 Hemiplegia, unspecified affecting unspecified side: Secondary | ICD-10-CM | POA: Insufficient documentation

## 2018-05-18 DIAGNOSIS — I70219 Atherosclerosis of native arteries of extremities with intermittent claudication, unspecified extremity: Secondary | ICD-10-CM

## 2018-05-18 DIAGNOSIS — I70262 Atherosclerosis of native arteries of extremities with gangrene, left leg: Secondary | ICD-10-CM | POA: Diagnosis not present

## 2018-05-18 HISTORY — PX: LOWER EXTREMITY ANGIOGRAPHY: CATH118251

## 2018-05-18 LAB — CREATININE, SERUM
CREATININE: 1.11 mg/dL — AB (ref 0.44–1.00)
GFR calc Af Amer: 50 mL/min — ABNORMAL LOW (ref >60)
GFR calc non Af Amer: 43 mL/min — ABNORMAL LOW (ref >60)

## 2018-05-18 LAB — BUN: BUN: 8 mg/dL (ref 8–23)

## 2018-05-18 SURGERY — LOWER EXTREMITY ANGIOGRAPHY
Anesthesia: Moderate Sedation | Laterality: Left

## 2018-05-18 MED ORDER — SODIUM CHLORIDE 0.9 % IV SOLN: 250.0000 mL | INTRAVENOUS | Status: DC | PRN

## 2018-05-18 MED ORDER — SODIUM CHLORIDE 0.9 % IV SOLN: INTRAVENOUS | Status: DC

## 2018-05-18 MED ORDER — LIDOCAINE HCL (PF) 1 % IJ SOLN
INTRAMUSCULAR | Status: AC
  Filled 2018-05-18: qty 30

## 2018-05-18 MED ORDER — HYDRALAZINE HCL 20 MG/ML IJ SOLN
5.0000 mg | INTRAMUSCULAR | Status: DC | PRN
Start: 2018-05-18 — End: 2018-05-18

## 2018-05-18 MED ORDER — ACETAMINOPHEN 325 MG PO TABS: 650.0000 mg | ORAL_TABLET | ORAL | Status: DC | PRN

## 2018-05-18 MED ORDER — FENTANYL CITRATE (PF) 100 MCG/2ML IJ SOLN
INTRAMUSCULAR | Status: DC | PRN
  Administered 2018-05-18: 50 ug via INTRAVENOUS

## 2018-05-18 MED ORDER — OXYCODONE HCL 5 MG PO TABS: 5.0000 mg | ORAL_TABLET | ORAL | Status: DC | PRN

## 2018-05-18 MED ORDER — ONDANSETRON HCL 4 MG/2ML IJ SOLN: 4.0000 mg | Freq: Four times a day (QID) | INTRAMUSCULAR | Status: DC | PRN

## 2018-05-18 MED ORDER — DEXTROSE 5 % IV SOLN
2.0000 g | Freq: Once | INTRAVENOUS | Status: AC
  Administered 2018-05-18: 2 g via INTRAVENOUS
  Filled 2018-05-18: qty 20

## 2018-05-18 MED ORDER — HEPARIN SODIUM (PORCINE) 1000 UNIT/ML IJ SOLN
INTRAMUSCULAR | Status: DC | PRN
  Administered 2018-05-18: 5000 [IU] via INTRAVENOUS

## 2018-05-18 MED ORDER — MORPHINE SULFATE (PF) 4 MG/ML IV SOLN: 2.0000 mg | INTRAVENOUS | Status: DC | PRN

## 2018-05-18 MED ORDER — SODIUM CHLORIDE 0.9% FLUSH
3.0000 mL | Freq: Two times a day (BID) | INTRAVENOUS | Status: DC
Start: 2018-05-18 — End: 2018-05-18

## 2018-05-18 MED ORDER — FAMOTIDINE 20 MG PO TABS: 40.0000 mg | ORAL_TABLET | Freq: Once | ORAL | Status: DC

## 2018-05-18 MED ORDER — SODIUM CHLORIDE 0.9% FLUSH: 3.0000 mL | INTRAVENOUS | Status: DC | PRN

## 2018-05-18 MED ORDER — SODIUM CHLORIDE 0.9 % IV BOLUS
250.0000 mL | Freq: Once | INTRAVENOUS | Status: AC
  Administered 2018-05-18: 250 mL via INTRAVENOUS

## 2018-05-18 MED ORDER — HEPARIN (PORCINE) IN NACL 1000-0.9 UT/500ML-% IV SOLN
INTRAVENOUS | Status: AC
  Filled 2018-05-18: qty 1000

## 2018-05-18 MED ORDER — ATORVASTATIN CALCIUM 10 MG PO TABS: 10.0000 mg | ORAL_TABLET | Freq: Every day | ORAL | 0 refills | Status: DC

## 2018-05-18 MED ORDER — IOPAMIDOL (ISOVUE-300) INJECTION 61%
INTRAVENOUS | Status: DC | PRN
Start: 2018-05-18 — End: 2018-05-18
  Administered 2018-05-18: 75 mL via INTRA_ARTERIAL

## 2018-05-18 MED ORDER — LABETALOL HCL 5 MG/ML IV SOLN
INTRAVENOUS | Status: DC | PRN
  Administered 2018-05-18: 10 mg via INTRAVENOUS

## 2018-05-18 MED ORDER — FENTANYL CITRATE (PF) 100 MCG/2ML IJ SOLN
INTRAMUSCULAR | Status: AC
  Filled 2018-05-18: qty 2

## 2018-05-18 MED ORDER — METHYLPREDNISOLONE SODIUM SUCC 125 MG IJ SOLR: 125.0000 mg | Freq: Once | INTRAMUSCULAR | Status: DC

## 2018-05-18 MED ORDER — SODIUM CHLORIDE FLUSH 0.9 % IV SOLN
INTRAVENOUS | Status: AC
  Filled 2018-05-18: qty 20

## 2018-05-18 MED ORDER — HEPARIN SODIUM (PORCINE) 1000 UNIT/ML IJ SOLN
INTRAMUSCULAR | Status: AC
  Filled 2018-05-18: qty 1

## 2018-05-18 MED ORDER — MIDAZOLAM HCL 2 MG/2ML IJ SOLN
INTRAMUSCULAR | Status: DC | PRN
  Administered 2018-05-18: 2 mg via INTRAVENOUS

## 2018-05-18 MED ORDER — MIDAZOLAM HCL 5 MG/5ML IJ SOLN
INTRAMUSCULAR | Status: AC
  Filled 2018-05-18: qty 5

## 2018-05-18 MED ORDER — LABETALOL HCL 5 MG/ML IV SOLN: 10.0000 mg | INTRAVENOUS | Status: DC | PRN

## 2018-05-18 MED ORDER — LABETALOL HCL 5 MG/ML IV SOLN
INTRAVENOUS | Status: AC
  Filled 2018-05-18: qty 4

## 2018-05-18 SURGICAL SUPPLY — 24 items
BALLN LUTONIX  018 4X60X130 (BALLOONS) ×1
BALLN LUTONIX 018 4X100X130 (BALLOONS) ×2
BALLN LUTONIX 018 4X60X130 (BALLOONS) ×1
BALLN ULTRASCORE 014 3X150X150 (BALLOONS) ×2
BALLOON LUTONIX 018 4X100X130 (BALLOONS) ×1 IMPLANT
BALLOON LUTONIX 018 4X60X130 (BALLOONS) ×1 IMPLANT
BALLOON ULTRSCRE 014 3X150X150 (BALLOONS) ×1 IMPLANT
CATH BEACON 5 .035 65 RIM TIP (CATHETERS) ×2 IMPLANT
CATH CROSSER S6 154CM (CATHETERS) ×2 IMPLANT
CATH USHER TPER 130CM (CATHETERS) ×2 IMPLANT
CATH VERT 5FR 125CM (CATHETERS) ×2 IMPLANT
DEVICE PRESTO INFLATION (MISCELLANEOUS) ×2 IMPLANT
DEVICE STARCLOSE SE CLOSURE (Vascular Products) ×2 IMPLANT
DEVICE TORQUE .025-.038 (MISCELLANEOUS) ×2 IMPLANT
GLIDEWIRE ADV .014X300CM (WIRE) ×2 IMPLANT
KIT FLOWMATE PROCEDURAL (KITS) ×2 IMPLANT
NEEDLE ENTRY 21GA 7CM ECHOTIP (NEEDLE) ×2 IMPLANT
PACK ANGIOGRAPHY (CUSTOM PROCEDURE TRAY) ×2 IMPLANT
SET INTRO CAPELLA COAXIAL (SET/KITS/TRAYS/PACK) ×2 IMPLANT
SHEATH ANL2 6FRX45 HC (SHEATH) ×2 IMPLANT
SHEATH BRITE TIP 5FRX11 (SHEATH) ×2 IMPLANT
VALVE HEMO TOUHY BORST Y (ADAPTER) ×2 IMPLANT
WIRE AQUATRACK .035X260CM (WIRE) ×2 IMPLANT
WIRE J 3MM .035X145CM (WIRE) ×2 IMPLANT

## 2018-05-18 NOTE — Op Note (Signed)
[facility] Percutaneous Study/Intervention Procedural Note   Date of Surgery: 05/18/2018  Surgeon:  Katha Cabal, MD.  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 Introduction catheter into left lower extremity 3rd order catheter placement  2. Contrast injection left lower extremity for distal runoff   3. Crosser atherectomy of the left peroneal artery 4. Percutaneous transluminal angioplasty left peroneal artery with a 4 mm Lutonix drug-eluting balloon             5. Star close closure right common femoral arteriotomy                Anesthesia: Conscious sedation was administered under my direct supervision by the interventional radiology RN. IV Versed plus fentanyl were utilized. Continuous ECG, pulse oximetry and blood pressure was monitored throughout the entire procedure. Conscious sedation was for a total of 78 minutes.  Sheath: 6 [place] Ansell right common femoral  Contrast: 75 cc  Fluoroscopy Time: 5.5 minutes  Indications: [name] presents with atherosclerotic occlusive disease bilateral lower extremities. The patient has developed rest pain and worsening of her claudication to the point where she is having pain when she walks around the house. This places the patient at high risk for limb loss and amputation. The risks and benefits are reviewed all questions answered patient agrees to proceed.  Procedure: [name] is a 84 y.o. y.o. female who was identified and appropriate procedural time out was performed. The patient was then placed supine on the table and prepped and draped in the usual sterile fashion.   Ultrasound was placed in the sterile sleeve and the right groin was evaluated the right common femoral  artery was echolucent and pulsatile indicating patency.  Image was recorded for the permanent record and under real-time visualization a microneedle was inserted into the common femoral artery microwire followed by a micro-sheath.  A J-wire was then advanced through the micro-sheath and a  5 [place] sheath was then inserted over a J-wire. J-wire was then advanced and a 5 French rim catheter was positioned at the aortic bifurcation.  Subsequently a rim catheter with the stiff angle Glidewire was used to cross the aortic bifurcation the catheter wire were advanced down into the left distal external iliac artery. Oblique view of the femoral bifurcation was then obtained and subsequently the wire was reintroduced and the pigtail catheter negotiated into the SFA representing third order catheter placement. Distal runoff was then performed.  5000 units of heparin was then given and allowed to circulate and a 6 [place] Ansell sheath was advanced up and over the bifurcation and positioned in the common femoral artery.  Wire and catheter were then used to negotiate the SFA and the catheter was then positioned in the distal popliteal where magnified imaging of the trifurcation was performed.  This demonstrated diffuse disease within the iliac and femoral-popliteal arteries however there were no hemodynamically significant stenoses identified.  The trifurcation is heavily diseased with occlusion of the anterior tibial and posterior tibial throughout their entire course.  The tibioperoneal trunk tapers to an occlusion and the first one third of the peroneal is occluded.  The distal two thirds of the peroneal are reconstituted and there are collaterals at the ankle level reconstituting the dorsalis pedis.  The S6 Crosser catheter was then prepped on the field and a straight Usher catheter was advanced into  the cul-de-sac of the tibioperoneal trunk under magnified imaging. Using the Crosser catheter the occlusion of the  peroneal was successfully negotiated.  Injection of contrast confirmed intraluminal positioning.  Usher catheter and 014 advantage wire were then advanced down into the distal peroneal.  Distal runoff was then completed by hand injection through the catheter verifying intraluminal position and distal runoff.  A 3 mm x 150 mm ultra score balloon was then used to angioplasty the peroneal artery. Inflations were to 10-12 atm for 1 full minute.  This was followed by a 4 mm x 100 mm Lutonix drug-eluting balloon and then a 4 mm x 60 mm Lutonix drug-eluting balloon 2 inflations were made the 1st-4 atmospheres within the peroneal and the 2nd-8 atmospheres within the tibioperoneal trunk extending into the distal popliteal.  Follow-up imaging demonstrated patency of the peroneal artery with less than 5% residual stenosis and preservation of the distal runoff with collaterals filling the dorsalis pedis.    After review of these images the sheath is pulled into the right external iliac oblique of the common femoral is obtained and a Star close device deployed. There no immediate Complications.  Findings: The common and external iliac arteries are widely patent bilaterally.  The left common femoral is widely patent as is the profunda femoris.  The SFA and popliteal demonstrate diffuse disease but there are no hemodynamically significant lesions identified.  The distal popliteal demonstrates increasing disease and the trifurcation is heavily diseased with occlusion of the anterior tibial and posterior tibial throughout their entire course and occlusion of the peroneal and its proximal one third.   Following angioplasty the peroneal now is widely patent with less than 5% residual stenosis and in-line flow.   Disposition: Patient was taken to the recovery room in stable condition having tolerated the procedure well.  [name] 05/18/2018,12:26 PM

## 2018-05-18 NOTE — H&P (Signed)
[facility] Admission History & Physical  MRN : [id number]  [name] is a 84 y.o. ([birth year]) female who presents with chief complaint of No chief complaint on file. Marland Kitchen  History of Present Illness: The patient presents to [facility] for angiogram and the hope for intervention of the left lower extremity. There has been a significant deterioration in the left lower extremity symptoms over the past several months.  She was last seen in the office on August 6 as a follow-up to her right lower extremity angiogram with intervention.  At that time she noted worsening left lower extremity complaints. The patient notes interval shortening of their claudication distance and development of mild rest pain symptoms. No new ulcers or wounds have occurred since the last visit.  There have been no significant changes to the patient's overall health care.  The patient denies amaurosis fugax or recent TIA symptoms. There are no recent neurological changes noted. The patient denies history of DVT, PE or superficial thrombophlebitis. The patient denies recent episodes of angina or shortness of breath.   ABI's Rt=0.96 and Lt=0.85 (previous ABI's Rt=0.69 and Lt=0.94)   Current Facility-Administered Medications  Medication Dose Route Frequency Provider Last Rate Last Dose  . 0.9 %  sodium chloride infusion   Intravenous Continuous Eulogio Ditch E, NP      . ceFAZolin (ANCEF) 2 g in dextrose 5 % 50 mL IVPB  2 g Intravenous Once Kris Hartmann, NP      . famotidine (PEPCID) tablet 40 mg  40 mg Oral Once Eulogio Ditch E, NP      . methylPREDNISolone sodium succinate (SOLU-MEDROL) 125 mg/2 mL injection 125 mg  125 mg Intravenous Once Kris Hartmann, NP        Past Medical History:  Diagnosis Date  . Abnormal glucose   . Anxiety   . Arthritis   . Asthma   . Calculus of gallbladder without mention of cholecystitis, with obstruction   . Cancer ([place])    kidney  . Candidiasis of the  esophagus   . Cataract cortical, senile   . Chronic kidney disease, stage III (moderate) (HCC)   . Chronic pain   . Chronic pain syndrome   . Corns and callosities   . Debility   . Depression   . Dermatophytosis of nail   . Diabetes mellitus without complication ([place])    unspecified, without mention of complication  . Difficult intubation   . Dysphagia   . Dyspnea    with exertion  . Edema, unspecified   . Emphysema lung ([place])   . Emphysema of lung ([place])   . Esophageal reflux   . Essential hypertension, benign   . Generalized atherosclerosis   . Glaucoma (increased eye pressure)   . Gout, unspecified   . Hemiplegia ([place])   . Hereditary and idiopathic peripheral neuropathy   . Hyperglycemia   . Hyperlipidemia   . Hypertension   . Hypoglycemia, unspecified   . Hypopotassemia   . Hypothyroidism   . Migraine, unspecified, without mention of intractable migraine without mention of status migrainosus   . Other vitamin B12 deficiency anemia   . Pain in limb   . Peripheral vascular disease ([place])   . Personal history of fall   . Stroke ([place])   . Thyroid disease   . Ulcer of foot ([place])   . Unspecified asthma(493.90)   . Unspecified constipation   . Unspecified hypertensive heart disease with heart failure(402.91)   .  Unspecified urinary incontinence   . Unspecified vitamin D deficiency     Past Surgical History:  Procedure Laterality Date  . ABDOMINAL HYSTERECTOMY    . arterial thrombi stents in both legs    . BACK SURGERY    . BACK SURGERY    . CATARACT EXTRACTION    . COLONOSCOPY    . ESOPHAGOGASTRODUODENOSCOPY (EGD) WITH PROPOFOL N/A 06/19/2016   Procedure: ESOPHAGOGASTRODUODENOSCOPY (EGD) WITH PROPOFOL;  Surgeon: Lollie Sails, MD;  Location: [facility] ENDOSCOPY;  Service: Endoscopy;  Laterality: N/A;  . ESOPHAGOGASTRODUODENOSCOPY (EGD) WITH PROPOFOL N/A 04/10/2017   Procedure: ESOPHAGOGASTRODUODENOSCOPY (EGD) WITH PROPOFOL;  Surgeon: Lollie Sails, MD;  Location:  [facility] ENDOSCOPY;  Service: Endoscopy;  Laterality: N/A;  . EYE SURGERY     cataract extraction  . LOWER EXTREMITY ANGIOGRAPHY Right 10/07/2016   Procedure: Lower Extremity Angiography;  Surgeon: Katha Cabal, MD;  Location: [facility];  Service: Cardiovascular;  Laterality: Right;  . LOWER EXTREMITY ANGIOGRAPHY Right 02/23/2018   Procedure: LOWER EXTREMITY ANGIOGRAPHY;  Surgeon: Katha Cabal, MD;  Location: [facility] [facility];  Service: Cardiovascular;  Laterality: Right;  . PERIPHERAL VASCULAR CATHETERIZATION Left 09/02/2016   Procedure: Lower Extremity Angiography;  Surgeon: Katha Cabal, MD;  Location: [facility];  Service: Cardiovascular;  Laterality: Left;  . PERIPHERAL VASCULAR CATHETERIZATION Right 09/16/2016   Procedure: Lower Extremity Angiography;  Surgeon: Katha Cabal, MD;  Location: [facility];  Service: Cardiovascular;  Laterality: Right;    Social History Social History   Tobacco Use  . Smoking status: Never Smoker  . Smokeless tobacco: Never Used  Substance Use Topics  . Alcohol use: No  . Drug use: No    Family History Family History  Problem Relation Age of Onset  . Cancer Father   . Stroke Father   . Seizures Mother   . Lung cancer Sister   . Bone cancer Sister   No family history of bleeding/clotting disorders, porphyria or autoimmune disease   No Known Allergies   REVIEW OF SYSTEMS (Negative unless checked)  Constitutional: [] Weight loss  [] Fever  [] Chills Cardiac: [] Chest pain   [] Chest pressure   [] Palpitations   [] Shortness of breath when laying flat   [] Shortness of breath at rest   [] Shortness of breath with exertion. Vascular:  [x] Pain in legs with walking   [x] Pain in legs at rest   [] Pain in legs when laying flat   [] Claudication   [] Pain in feet when walking  [] Pain in feet at rest  [] Pain in feet when laying flat   [] History of DVT   [] Phlebitis   [x] Swelling in legs   [] Varicose veins    [] Non-healing ulcers Pulmonary:   [] Uses home oxygen   [] Productive cough   [] Hemoptysis   [] Wheeze  [] COPD   [] Asthma Neurologic:  [] Dizziness  [] Blackouts   [] Seizures   [] History of stroke   [] History of TIA  [] Aphasia   [] Temporary blindness   [] Dysphagia   [] Weakness or numbness in arms   [] Weakness or numbness in legs Musculoskeletal:  [] Arthritis   [] Joint swelling   [] Joint pain   [] Low back pain Hematologic:  [] Easy bruising  [] Easy bleeding   [] Hypercoagulable state   [] Anemic  [] Hepatitis Gastrointestinal:  [] Blood in stool   [] Vomiting blood  [] Gastroesophageal reflux/heartburn   [] Difficulty swallowing. Genitourinary:  [] Chronic kidney disease   [] Difficult urination  [] Frequent urination  [] Burning with urination   [] Blood in urine Skin:  [] Rashes   []   Ulcers   [] Wounds Psychological:  [] History of anxiety   []  History of major depression.  Physical Examination  There were no vitals filed for this visit. There is no height or weight on file to calculate BMI. Gen: WD/WN, NAD Head: [place]/AT, No temporalis wasting. Prominent temp pulse not noted. Ear/Nose/Throat: Hearing grossly intact, nares w/o erythema or drainage, oropharynx w/o Erythema/Exudate,  Eyes: Conjunctiva clear, sclera non-icteric Neck: Trachea midline.  No JVD.  Pulmonary:  Good air movement, respirations not labored, no use of accessory muscles.  Cardiac: RRR, normal S1, S2. Vascular:  Vessel Right Left  Radial Palpable Palpable  PT Not Palpable Not Palpable  DP Not Palpable Not Palpable  Gastrointestinal: soft, non-tender/non-distended. No guarding/reflex.  Musculoskeletal: M/S 5/5 throughout.  Extremities without ischemic changes.  No deformity or atrophy.  Neurologic: Sensation grossly intact in extremities.  Symmetrical.  Speech is fluent. Motor exam as listed above. Psychiatric: Judgment intact, Mood & affect appropriate for pt's clinical situation. Dermatologic: venous rashes.  No cellulitis or open  wounds. Lymph : No Cervical, Axillary, or Inguinal lymphadenopathy.     CBC Lab Results  Component Value Date   WBC 5.0 12/09/2017   HGB 11.1 (L) 12/11/2017   HCT 32.6 (L) 12/09/2017   MCV 92.5 12/09/2017   PLT 219 12/09/2017    BMET    Component Value Date/Time   NA 138 12/09/2017 2349   NA 139 09/22/2014 0521   K 5.3 (H) 02/23/2018 0743   K 4.4 09/22/2014 0521   CL 105 12/09/2017 2349   CL 109 (H) 09/22/2014 0521   CO2 24 12/09/2017 2349   CO2 24 09/22/2014 0521   GLUCOSE 104 (H) 12/09/2017 2349   GLUCOSE 83 09/22/2014 0521   BUN 17 02/23/2018 0743   BUN 9 09/22/2014 0521   CREATININE 1.47 (H) 02/23/2018 0743   CREATININE 0.97 09/22/2014 0521   CALCIUM 8.6 (L) 12/09/2017 2349   CALCIUM 9.0 09/22/2014 0521   GFRNONAA 31 (L) 02/23/2018 0743   GFRNONAA 58 (L) 09/22/2014 0521   GFRNONAA 43 (L) 02/27/2013 1139   GFRAA 36 (L) 02/23/2018 0743   GFRAA >60 09/22/2014 0521   GFRAA 50 (L) 02/27/2013 1139   CrCl cannot be calculated (Patient's most recent lab result is older than the maximum 21 days allowed.).  COAG Lab Results  Component Value Date   INR 1.08 05/02/2017   INR 1.12 07/20/2016   INR 1.04 06/19/2016    Radiology No results found.   Assessment/Plan 1. Atherosclerosis of native arteries of the extremities with ulceration ([place])  Recommend:  The patient has evidence of severe atherosclerotic changes of both lower extremities associated with rest pain.  left leg is more symptomatic than the left.  This represents a limb threatening ischemia and places the patient at the risk for limb loss.  Patient should undergo angiography of the left lower extremity with the hope for intervention for limb salvage.  The risks and benefits as well as the alternative therapies was discussed in detail with the patient.  All questions were answered.  Patient agrees to proceed with angiography.  The patient will follow up with me in the office after the procedure.    2. Chronic venous insufficiency  No surgery or intervention at this point in time.    I have reviewed my previous discussion with the patient regarding swelling and why it  causes symptoms.  The patient is doing well with compression and will continue wearing graduated compression stockings class 1 (20-30  mmHg) on a daily basis a prescription was given. The patient will  continue wearing the stockings first thing in the morning and removing them in the evening. The patient is instructed specifically not to sleep in the stockings.    In addition, behavioral modification including elevation during the day and exercise will be continued.    Patient should follow-up on an annual basis   3. Lymphedema  No surgery or intervention at this point in time.    I have reviewed my previous discussion with the patient regarding swelling and why it  causes symptoms.  The patient is doing well with compression and will continue wearing graduated compression stockings class 1 (20-30 mmHg) on a daily basis a prescription was given. The patient will  continue wearing the stockings first thing in the morning and removing them in the evening. The patient is instructed specifically not to sleep in the stockings.    In addition, behavioral modification including elevation during the day and exercise will be continued.    Patient should follow-up on an annual basis   4. Essential hypertension Continue antihypertensive medications as already ordered, these medications have been reviewed and there are no changes at this time.   5. Pulmonary emphysema, unspecified emphysema type ([place]) Continue pulmonary medications and aerosols as already ordered, these medications have been reviewed and there are no changes at this time.    6. Mixed hyperlipidemia Continue statin as ordered and reviewed, no changes at this time   Hortencia Pilar, MD  05/18/2018 10:53 AM

## 2018-05-18 NOTE — Progress Notes (Signed)
Attempted to call report 3 times to [facility]. On third attempt, was only able to give report to Apolonio Schneiders who stated she was a med tech there. Relayed this to [place] who was with patient from facility. Went over D/C instructions with Malachy Mood and patient also.

## 2018-05-19 ENCOUNTER — Encounter: Payer: Self-pay | Admitting: Vascular Surgery

## 2018-06-09 ENCOUNTER — Encounter (INDEPENDENT_AMBULATORY_CARE_PROVIDER_SITE_OTHER): Payer: Medicare Other

## 2018-06-09 ENCOUNTER — Ambulatory Visit (INDEPENDENT_AMBULATORY_CARE_PROVIDER_SITE_OTHER): Payer: Medicare Other | Admitting: Nurse Practitioner

## 2018-06-22 ENCOUNTER — Other Ambulatory Visit (INDEPENDENT_AMBULATORY_CARE_PROVIDER_SITE_OTHER): Payer: Self-pay | Admitting: Vascular Surgery

## 2018-06-22 DIAGNOSIS — I70249 Atherosclerosis of native arteries of left leg with ulceration of unspecified site: Secondary | ICD-10-CM

## 2018-06-22 DIAGNOSIS — Z9862 Peripheral vascular angioplasty status: Secondary | ICD-10-CM

## 2018-06-23 ENCOUNTER — Encounter (INDEPENDENT_AMBULATORY_CARE_PROVIDER_SITE_OTHER): Payer: Self-pay | Admitting: Nurse Practitioner

## 2018-06-23 ENCOUNTER — Other Ambulatory Visit (INDEPENDENT_AMBULATORY_CARE_PROVIDER_SITE_OTHER): Payer: Self-pay | Admitting: Vascular Surgery

## 2018-06-23 ENCOUNTER — Ambulatory Visit (INDEPENDENT_AMBULATORY_CARE_PROVIDER_SITE_OTHER): Payer: Medicare Other

## 2018-06-23 ENCOUNTER — Ambulatory Visit (INDEPENDENT_AMBULATORY_CARE_PROVIDER_SITE_OTHER): Payer: Medicare Other | Admitting: Nurse Practitioner

## 2018-06-23 VITALS — BP 141/61 | HR 76 | Resp 16 | Ht 64.0 in | Wt 194.8 lb

## 2018-06-23 DIAGNOSIS — E782 Mixed hyperlipidemia: Secondary | ICD-10-CM | POA: Diagnosis not present

## 2018-06-23 DIAGNOSIS — I70249 Atherosclerosis of native arteries of left leg with ulceration of unspecified site: Secondary | ICD-10-CM

## 2018-06-23 DIAGNOSIS — Z9862 Peripheral vascular angioplasty status: Secondary | ICD-10-CM | POA: Diagnosis not present

## 2018-06-23 DIAGNOSIS — I10 Essential (primary) hypertension: Secondary | ICD-10-CM

## 2018-06-23 DIAGNOSIS — I7025 Atherosclerosis of native arteries of other extremities with ulceration: Secondary | ICD-10-CM | POA: Diagnosis not present

## 2018-06-23 DIAGNOSIS — I890 Lymphedema, not elsewhere classified: Secondary | ICD-10-CM

## 2018-06-23 NOTE — Telephone Encounter (Signed)
Patient was given Atorvastatin after her procedure. Should she still be taking this medication at this point?  And should we okay the refills going forward, or was this just for a short period of time following the procedure?

## 2018-06-23 NOTE — Telephone Encounter (Signed)
Also, I just noted that she has 10 refills, so yes she should continue taking it.

## 2018-06-23 NOTE — Progress Notes (Signed)
Subjective:    Patient ID: [name], female    DOB: 1928/02/01, 84 y.o.   MRN: [id number] Chief Complaint  Patient presents with  . Follow-up    [facility] ABI 2week     HPI  [name] is a 84 y.o. female that returns to the office for followup and review of the noninvasive studies. No new ulcers or wounds have occurred since the last visit.  States that the ulcers are very nearly healed at this point.  The patient denies any rest pain or claudication-like symptoms.  She states that overall she feels as if her legs feel good.  There have been no significant changes to the patient's overall health care.  The patient denies amaurosis fugax or recent TIA symptoms. There are no recent neurological changes noted. The patient denies history of DVT, PE or superficial thrombophlebitis. The patient denies recent episodes of angina or shortness of breath.   ABI's Rt=0.50 and Lt=0.88 (previous ABI's Rt=0.96 and Lt=0.85) The waveforms on her right lower extremity exhibit biphasic waveforms of her anterior tibial artery with monophasic of her posterior tibial artery.  Her digit waveform is weak with a toe pressure of 45.  The left lower extremity waveforms exhibited a monophasic anterior tibial and a biphasic posterior tibial with normal toe waveforms.  Past Medical History:  Diagnosis Date  . Abnormal glucose   . Anxiety   . Arthritis   . Asthma   . Calculus of gallbladder without mention of cholecystitis, with obstruction   . Cancer ([place])    kidney  . Candidiasis of the esophagus   . Cataract cortical, senile   . Chronic kidney disease, stage III (moderate) (HCC)   . Chronic pain   . Chronic pain syndrome   . Corns and callosities   . Debility   . Depression   . Dermatophytosis of nail   . Diabetes mellitus without complication ([place])    unspecified, without mention of complication  . Difficult intubation   . Dysphagia   . Dyspnea    with exertion  . Edema, unspecified     . Emphysema lung ([place])   . Emphysema of lung ([place])   . Esophageal reflux   . Essential hypertension, benign   . Generalized atherosclerosis   . Glaucoma (increased eye pressure)   . Gout, unspecified   . Hemiplegia ([place])   . Hereditary and idiopathic peripheral neuropathy   . Hyperglycemia   . Hyperlipidemia   . Hypertension   . Hypoglycemia, unspecified   . Hypopotassemia   . Hypothyroidism   . Migraine, unspecified, without mention of intractable migraine without mention of status migrainosus   . Other vitamin B12 deficiency anemia   . Pain in limb   . Peripheral vascular disease ([place])   . Personal history of fall   . Stroke ([place])   . Thyroid disease   . Ulcer of foot ([place])   . Unspecified asthma(493.90)   . Unspecified constipation   . Unspecified hypertensive heart disease with heart failure(402.91)   . Unspecified urinary incontinence   . Unspecified vitamin D deficiency     Past Surgical History:  Procedure Laterality Date  . ABDOMINAL HYSTERECTOMY    . arterial thrombi stents in both legs    . BACK SURGERY    . BACK SURGERY    . CATARACT EXTRACTION    . COLONOSCOPY    . ESOPHAGOGASTRODUODENOSCOPY (EGD) WITH PROPOFOL N/A 06/19/2016   Procedure: ESOPHAGOGASTRODUODENOSCOPY (EGD) WITH PROPOFOL;  Surgeon: Lollie Sails, MD;  Location: [facility] ENDOSCOPY;  Service: Endoscopy;  Laterality: N/A;  . ESOPHAGOGASTRODUODENOSCOPY (EGD) WITH PROPOFOL N/A 04/10/2017   Procedure: ESOPHAGOGASTRODUODENOSCOPY (EGD) WITH PROPOFOL;  Surgeon: Lollie Sails, MD;  Location: [facility] ENDOSCOPY;  Service: Endoscopy;  Laterality: N/A;  . EYE SURGERY     cataract extraction  . LOWER EXTREMITY ANGIOGRAPHY Right 10/07/2016   Procedure: Lower Extremity Angiography;  Surgeon: Katha Cabal, MD;  Location: [place] CV LAB;  Service: Cardiovascular;  Laterality: Right;  . LOWER EXTREMITY ANGIOGRAPHY Right 02/23/2018   Procedure: LOWER EXTREMITY ANGIOGRAPHY;  Surgeon: Katha Cabal,  MD;  Location: [place] CV LAB;  Service: Cardiovascular;  Laterality: Right;  . LOWER EXTREMITY ANGIOGRAPHY Left 05/18/2018   Procedure: LOWER EXTREMITY ANGIOGRAPHY;  Surgeon: Katha Cabal, MD;  Location: [place] [facility] LAB;  Service: Cardiovascular;  Laterality: Left;  . PERIPHERAL VASCULAR CATHETERIZATION Left 09/02/2016   Procedure: Lower Extremity Angiography;  Surgeon: Katha Cabal, MD;  Location: [place] [facility] LAB;  Service: Cardiovascular;  Laterality: Left;  . PERIPHERAL VASCULAR CATHETERIZATION Right 09/16/2016   Procedure: Lower Extremity Angiography;  Surgeon: Katha Cabal, MD;  Location: [place] [facility] LAB;  Service: Cardiovascular;  Laterality: Right;    Social History   Socioeconomic History  . Marital status: Single    Spouse name: Not on file  . Number of children: Not on file  . Years of education: Not on file  . Highest education level: Not on file  Occupational History  . Occupation: retired  Scientific laboratory technician  . Financial resource strain: Not on file  . Food insecurity:    Worry: Not on file    Inability: Not on file  . Transportation needs:    Medical: Not on file    Non-medical: Not on file  Tobacco Use  . Smoking status: Never Smoker  . Smokeless tobacco: Never Used  Substance and Sexual Activity  . Alcohol use: No  . Drug use: No  . Sexual activity: Not Currently  Lifestyle  . Physical activity:    Days per week: Not on file    Minutes per session: Not on file  . Stress: Not on file  Relationships  . Social connections:    Talks on phone: Not on file    Gets together: Not on file    Attends religious service: Not on file    Active member of club or organization: Not on file    Attends meetings of clubs or organizations: Not on file    Relationship status: Not on file  . Intimate partner violence:    Fear of current or ex partner: Not on file    Emotionally abused: Not on file    Physically abused: Not on file    Forced  sexual activity: Not on file  Other Topics Concern  . Not on file  Social History Narrative  . Not on file    Family History  Problem Relation Age of Onset  . Cancer Father   . Stroke Father   . Seizures Mother   . Lung cancer Sister   . Bone cancer Sister     No Known Allergies   Review of Systems   Review of Systems: Negative Unless Checked Constitutional: [] Weight loss  [] Fever  [] Chills Cardiac: [] Chest pain   []  Atrial Fibrillation  [] Palpitations   [] Shortness of breath when laying flat   [] Shortness of breath with exertion. Vascular:  [] Pain in legs with walking   []   Pain in legs with standing  [] History of DVT   [] Phlebitis   [x] Swelling in legs   [] Varicose veins   [] Non-healing ulcers Pulmonary:   [] Uses home oxygen   [] Productive cough   [] Hemoptysis   [] Wheeze  [] COPD   [] Asthma Neurologic:  [] Dizziness   [] Seizures   [x] History of stroke   [] History of TIA  [] Aphasia   [] Vissual changes   [] Weakness or numbness in arm   [x] Weakness or numbness in leg Musculoskeletal:   [] Joint swelling   [] Joint pain   [] Low back pain  []  History of Knee Replacement Hematologic:  [] Easy bruising  [] Easy bleeding   [] Hypercoagulable state   [] Anemic Gastrointestinal:  [] Diarrhea   [] Vomiting  [] Gastroesophageal reflux/heartburn   [] Difficulty swallowing. Genitourinary:  [] Chronic kidney disease   [] Difficult urination  [] Anuric   [] Blood in urine Skin:  [] Rashes   [] Ulcers  Psychological:  [] History of anxiety   []  History of major depression  []  Memory Difficulties     Objective:   Physical Exam  BP (!) 141/61 (BP Location: Right Arm)   Pulse 76   Resp 16   Ht 5\' 4"  (1.626 m)   Wt 194 lb 12.8 oz (88.4 kg)   LMP  (LMP Unknown)   BMI 33.44 kg/m   Gen: WD/WN, NAD Head: [place]/AT, No temporalis wasting.  Ear/Nose/Throat: Hearing grossly intact, nares w/o erythema or drainage Eyes: PER, EOMI, sclera nonicteric.  Neck: Supple, no masses.  No JVD.  Pulmonary:  Good air movement,  no use of accessory muscles.  Cardiac: RRR Vascular:  Vessel Right Left  Radial Palpable Palpable  Dorsalis Pedis  not palpable  not palpable  Posterior Tibial  not palpable  not palpable   Gastrointestinal: soft, non-distended. No guarding/no peritoneal signs.  Musculoskeletal: well chair bound.  No deformity or atrophy.  Neurologic: Pain and light touch intact in extremities.  Symmetrical.  Speech is fluent. Motor exam as listed above. Psychiatric: Judgment intact, Mood & affect appropriate for pt's clinical situation. Dermatologic: No Venous rashes.  No open ulcerations present, however evidence of recently healed ulcer on left lower extremity. No changes consistent with cellulitis. Lymph : No Cervical lymphadenopathy, no lichenification or skin changes of chronic lymphedema.      Assessment & Plan:   1. Atherosclerosis of native arteries of the extremities with ulceration ([place]) We will have Ms. Darden return in 1 to 2 weeks to obtain lower extremity arterial duplexes.  Today her wounds appear much better and very nearly healed.  Her most recent intervention was on 05/18/2018 to the left lower extremity.  She underwent intervention on her right lower extremity on 02/23/2018, however her ABIs are greatly decreased from the last visit.  She is not endorsing any rest pain or new ulcerations.  We will assess whether she needs to undergo intervention on her right lower extremity at this upcoming visit.   - VAS [place] LOWER EXTREMITY ARTERIAL DUPLEX; Future  2. Essential hypertension Continue antihypertensive medications as already ordered, these medications have been reviewed and there are no changes at this time.   3. Mixed hyperlipidemia Continue statin as ordered and reviewed, no changes at this time   4. Lymphedema  No surgery or intervention at this point in time.    I have reviewed my discussion with the patient regarding lymphedema and why it  causes symptoms.  Patient will  continue wearing graduated compression stockings class 1 (20-30 mmHg) on a daily basis a prescription was given. The patient  is reminded to put the stockings on first thing in the morning and removing them in the evening. The patient is instructed specifically not to sleep in the stockings.   In addition, behavioral modification throughout the day will be continued.  This will include frequent elevation (such as in a recliner), use of over the counter pain medications as needed and exercise such as walking.    Current Outpatient Medications on File Prior to Visit  Medication Sig Dispense Refill  . acetaminophen (TYLENOL) 325 MG tablet Take 650 mg by mouth every 4 (four) hours as needed for mild pain.     Marland Kitchen allopurinol (ZYLOPRIM) 100 MG tablet Take 100 mg by mouth daily.    Marland Kitchen amitriptyline (ELAVIL) 25 MG tablet Take 12.5 mg by mouth 2 (two) times daily.     Marland Kitchen aspirin EC 81 MG EC tablet Take 1 tablet (81 mg total) by mouth daily. 30 tablet 0  . Cholecalciferol (VITAMIN D-3) 1000 UNITS CAPS Take 2,000 Units by mouth daily.     . clopidogrel (PLAVIX) 75 MG tablet Take 1 tablet (75 mg total) by mouth daily. 30 tablet 4  . DULoxetine (CYMBALTA) 30 MG capsule Take 30 mg by mouth every morning.    . furosemide (LASIX) 20 MG tablet Take 1 tablet (20 mg total) by mouth every other day. 30 tablet 0  . gabapentin (NEURONTIN) 400 MG capsule Take 400 mg by mouth 2 (two) times daily.     Marland Kitchen gabapentin (NEURONTIN) 600 MG tablet Take 900 mg by mouth at bedtime.     Marland Kitchen latanoprost (XALATAN) 0.005 % ophthalmic solution Place 1 drop into both eyes at bedtime.    Marland Kitchen levothyroxine (SYNTHROID, LEVOTHROID) 25 MCG tablet Take 25 mcg by mouth daily before breakfast.    . metoprolol tartrate (LOPRESSOR) 25 MG tablet Take 12.5 mg by mouth 2 (two) times daily.    . ondansetron (ZOFRAN) 4 MG tablet Take 4 mg by mouth every 8 (eight) hours as needed for nausea or vomiting.    . pantoprazole (PROTONIX) 40 MG tablet Take 40 mg by  mouth 2 (two) times daily.     Marland Kitchen senna (SENOKOT) 8.6 MG tablet Take 1 tablet by mouth 3 (three) times daily as needed for constipation.    . topiramate (TOPAMAX) 25 MG tablet Take 25 mg by mouth at bedtime.    . traMADol (ULTRAM) 50 MG tablet Take 50 mg by mouth every 8 (eight) hours as needed (for pain control).      No current facility-administered medications on file prior to visit.     There are no Patient Instructions on file for this visit. No follow-ups on file.   Kris Hartmann, NP  This note was completed with Sales executive.  Any errors are purely unintentional.

## 2018-06-23 NOTE — Telephone Encounter (Signed)
I will fill one refill, however she should should follow up with her primary care physician for continued use of this medication.  This is because guidelines state that liver enzymes should be monitored yearly while she is on this medication, which is something that we typically do not do.  Thanks.

## 2018-07-12 ENCOUNTER — Other Ambulatory Visit (INDEPENDENT_AMBULATORY_CARE_PROVIDER_SITE_OTHER): Payer: Self-pay | Admitting: Nurse Practitioner

## 2018-07-12 DIAGNOSIS — I7025 Atherosclerosis of native arteries of other extremities with ulceration: Secondary | ICD-10-CM

## 2018-07-13 ENCOUNTER — Encounter (INDEPENDENT_AMBULATORY_CARE_PROVIDER_SITE_OTHER): Payer: Self-pay | Admitting: Nurse Practitioner

## 2018-07-13 ENCOUNTER — Ambulatory Visit (INDEPENDENT_AMBULATORY_CARE_PROVIDER_SITE_OTHER): Payer: Medicare Other | Admitting: Nurse Practitioner

## 2018-07-13 ENCOUNTER — Ambulatory Visit (INDEPENDENT_AMBULATORY_CARE_PROVIDER_SITE_OTHER): Payer: Medicare Other

## 2018-07-13 VITALS — BP 180/73 | HR 69 | Resp 16 | Wt 199.0 lb

## 2018-07-13 DIAGNOSIS — E782 Mixed hyperlipidemia: Secondary | ICD-10-CM | POA: Diagnosis not present

## 2018-07-13 DIAGNOSIS — I10 Essential (primary) hypertension: Secondary | ICD-10-CM | POA: Diagnosis not present

## 2018-07-13 DIAGNOSIS — I7025 Atherosclerosis of native arteries of other extremities with ulceration: Secondary | ICD-10-CM

## 2018-07-13 DIAGNOSIS — I890 Lymphedema, not elsewhere classified: Secondary | ICD-10-CM | POA: Diagnosis not present

## 2018-07-13 NOTE — Progress Notes (Signed)
Subjective:    Patient ID: Ninfa Meeker, female    DOB: October 20, 1927, 84 y.o.   MRN: [id number] Chief Complaint  Patient presents with  . Follow-up    ulltrasound follow up    HPI  Damaris W Wiersma is a 84 y.o. female that returns the office for follow-up with noninvasive studies. Ulcers or wounds have occurred since her last visit.  Her previous ulcerations are essentially healed with just an area of dry flaky skin over top.  There is no open area.  She denies any rest pain or claudication-like symptoms.  She states that overall she feels good.   There have been no significant changes to her overall health care.  Today she underwent bilateral ABIs which reveal an ABI of 0.69 on the right and 1.12 on the left.  Previous ABIs done on 06/23/2018 reveal an ABI of 0.50 on the right and 0.88 on the left.  Most recent intervention was done on 02/23/2018.  Patient had strong bilateral toe waveforms.  The patient also underwent a bilateral lower extremity internal duplex which revealed triphasic waveforms in the right lower extremity down to the level of the proximal popliteal artery.  There appears to be a 50 to 74% stenosis in that area, which also transitions to biphasic waveforms.  The distal popliteal artery as well as the anterior tibial artery have monophasic waveforms with biphasic waveforms in the posterior tibial artery.  The left lower extremity has triphasic waveforms down to the level of the posterior tibial artery, which is monophasic.  There is also some noted 30 to 49% stenosis noted in the left superficial femoral artery.  Past Medical History:  Diagnosis Date  . Abnormal glucose   . Anxiety   . Arthritis   . Asthma   . Calculus of gallbladder without mention of cholecystitis, with obstruction   . Cancer ([place])    kidney  . Candidiasis of the esophagus   . Cataract cortical, senile   . Chronic kidney disease, stage III (moderate) (HCC)   . Chronic pain   . Chronic pain syndrome    . Corns and callosities   . Debility   . Depression   . Dermatophytosis of nail   . Diabetes mellitus without complication ([place])    unspecified, without mention of complication  . Difficult intubation   . Dysphagia   . Dyspnea    with exertion  . Edema, unspecified   . Emphysema lung ([place])   . Emphysema of lung ([place])   . Esophageal reflux   . Essential hypertension, benign   . Generalized atherosclerosis   . Glaucoma (increased eye pressure)   . Gout, unspecified   . Hemiplegia ([place])   . Hereditary and idiopathic peripheral neuropathy   . Hyperglycemia   . Hyperlipidemia   . Hypertension   . Hypoglycemia, unspecified   . Hypopotassemia   . Hypothyroidism   . Migraine, unspecified, without mention of intractable migraine without mention of status migrainosus   . Other vitamin B12 deficiency anemia   . Pain in limb   . Peripheral vascular disease ([place])   . Personal history of fall   . Stroke ([place])   . Thyroid disease   . Ulcer of foot ([place])   . Unspecified asthma(493.90)   . Unspecified constipation   . Unspecified hypertensive heart disease with heart failure(402.91)   . Unspecified urinary incontinence   . Unspecified vitamin D deficiency     Past Surgical History:  Procedure Laterality  Date  . ABDOMINAL HYSTERECTOMY    . arterial thrombi stents in both legs    . BACK SURGERY    . BACK SURGERY    . CATARACT EXTRACTION    . COLONOSCOPY    . ESOPHAGOGASTRODUODENOSCOPY (EGD) WITH PROPOFOL N/A 06/19/2016   Procedure: ESOPHAGOGASTRODUODENOSCOPY (EGD) WITH PROPOFOL;  Surgeon: Lollie Sails, MD;  Location: [facility] ENDOSCOPY;  Service: Endoscopy;  Laterality: N/A;  . ESOPHAGOGASTRODUODENOSCOPY (EGD) WITH PROPOFOL N/A 04/10/2017   Procedure: ESOPHAGOGASTRODUODENOSCOPY (EGD) WITH PROPOFOL;  Surgeon: Lollie Sails, MD;  Location: [facility] ENDOSCOPY;  Service: Endoscopy;  Laterality: N/A;  . EYE SURGERY     cataract extraction  . LOWER EXTREMITY ANGIOGRAPHY Right  10/07/2016   Procedure: Lower Extremity Angiography;  Surgeon: Katha Cabal, MD;  Location: [place] CV LAB;  Service: Cardiovascular;  Laterality: Right;  . LOWER EXTREMITY ANGIOGRAPHY Right 02/23/2018   Procedure: LOWER EXTREMITY ANGIOGRAPHY;  Surgeon: Katha Cabal, MD;  Location: [place] [facility];  Service: Cardiovascular;  Laterality: Right;  . LOWER EXTREMITY ANGIOGRAPHY Left 05/18/2018   Procedure: LOWER EXTREMITY ANGIOGRAPHY;  Surgeon: Katha Cabal, MD;  Location: [place] [facility];  Service: Cardiovascular;  Laterality: Left;  . PERIPHERAL VASCULAR CATHETERIZATION Left 09/02/2016   Procedure: Lower Extremity Angiography;  Surgeon: Katha Cabal, MD;  Location: [place] [facility];  Service: Cardiovascular;  Laterality: Left;  . PERIPHERAL VASCULAR CATHETERIZATION Right 09/16/2016   Procedure: Lower Extremity Angiography;  Surgeon: Katha Cabal, MD;  Location: [place] [facility];  Service: Cardiovascular;  Laterality: Right;    Social History   Socioeconomic History  . Marital status: Single    Spouse name: Not on file  . Number of children: Not on file  . Years of education: Not on file  . Highest education level: Not on file  Occupational History  . Occupation: retired  Scientific laboratory technician  . Financial resource strain: Not on file  . Food insecurity:    Worry: Not on file    Inability: Not on file  . Transportation needs:    Medical: Not on file    Non-medical: Not on file  Tobacco Use  . Smoking status: Never Smoker  . Smokeless tobacco: Never Used  Substance and Sexual Activity  . Alcohol use: No  . Drug use: No  . Sexual activity: Not Currently  Lifestyle  . Physical activity:    Days per week: Not on file    Minutes per session: Not on file  . Stress: Not on file  Relationships  . Social connections:    Talks on phone: Not on file    Gets together: Not on file    Attends religious service: Not on file    Active member of club or  organization: Not on file    Attends meetings of clubs or organizations: Not on file    Relationship status: Not on file  . Intimate partner violence:    Fear of current or ex partner: Not on file    Emotionally abused: Not on file    Physically abused: Not on file    Forced sexual activity: Not on file  Other Topics Concern  . Not on file  Social History Narrative  . Not on file    Family History  Problem Relation Age of Onset  . Cancer Father   . Stroke Father   . Seizures Mother   . Lung cancer Sister   . Bone cancer Sister  No Known Allergies   Review of Systems   Review of Systems: Negative Unless Checked Constitutional: [] Weight loss  [] Fever  [] Chills Cardiac: [] Chest pain   []  Atrial Fibrillation  [] Palpitations   [] Shortness of breath when laying flat   [] Shortness of breath with exertion. Vascular:  [] Pain in legs with walking   [] Pain in legs with standing  [] History of DVT   [] Phlebitis   [x] Swelling in legs   [] Varicose veins   [] Non-healing ulcers Pulmonary:   [] Uses home oxygen   [] Productive cough   [] Hemoptysis   [] Wheeze  [] COPD   [] Asthma Neurologic:  [] Dizziness   [] Seizures   [x] History of stroke   [] History of TIA  [] Aphasia   [] Vissual changes   [] Weakness or numbness in arm   [x] Weakness or numbness in leg Musculoskeletal:   [] Joint swelling   [] Joint pain   [] Low back pain  []  History of Knee Replacement Hematologic:  [] Easy bruising  [] Easy bleeding   [] Hypercoagulable state   [] Anemic Gastrointestinal:  [] Diarrhea   [] Vomiting  [] Gastroesophageal reflux/heartburn   [] Difficulty swallowing. Genitourinary:  [] Chronic kidney disease   [] Difficult urination  [] Anuric   [] Blood in urine Skin:  [] Rashes   [] Ulcers  Psychological:  [] History of anxiety   []  History of major depression  []  Memory Difficulties     Objective:   Physical Exam  BP (!) 180/73 (BP Location: Right Arm)   Pulse 69   Resp 16   Wt 199 lb (90.3 kg)   LMP  (LMP Unknown)    BMI 34.16 kg/m   Gen: WD/WN, NAD Head: [place]/AT, No temporalis wasting.  Ear/Nose/Throat: Hearing grossly intact, nares w/o erythema or drainage Eyes: PER, EOMI, sclera nonicteric.  Neck: Supple, no masses.  No JVD.  Pulmonary:  Good air movement, no use of accessory muscles.  Cardiac: RRR Vascular:  2+ edema bilaterally Vessel Right Left  Radial Palpable Palpable  Dorsalis Pedis  not palpable  trace palpable  Posterior Tibial  not palpable  not palpable   Gastrointestinal: soft, non-distended. No guarding/no peritoneal signs.  Musculoskeletal: Uses walker for ambulation No deformity or atrophy.  Neurologic: Pain and light touch intact in extremities.  Symmetrical.  Speech is fluent. Motor exam as listed above. Psychiatric: Judgment intact, Mood & affect appropriate for pt's clinical situation. Dermatologic: No Venous rashes.  No open ulcerations present, however dry flaking skin over previous ulceration sites. No changes consistent with cellulitis. Lymph : No Cervical lymphadenopathy, no lichenification or skin changes of chronic lymphedema.      Assessment & Plan:   1. Atherosclerosis of native arteries of the extremities with ulceration ([place]) Today she underwent bilateral ABIs which reveal an ABI of 0.69 on the right and 1.12 on the left.  Previous ABIs done on 06/23/2018 reveal an ABI of 0.50 on the right and 0.88 on the left.  Most recent intervention was done on 02/23/2018.  Patient had strong bilateral toe waveforms.  The patient also underwent a bilateral lower extremity internal duplex which revealed triphasic waveforms in the right lower extremity down to the level of the proximal popliteal artery.  There appears to be a 50 to 74% stenosis in that area, which also transitions to biphasic waveforms.  The distal popliteal artery as well as the anterior tibial artery have monophasic waveforms with biphasic waveforms in the posterior tibial artery.  The left lower extremity has triphasic  waveforms down to the level of the posterior tibial artery, which is monophasic.  There is  also some noted 30 to 49% stenosis noted in the left superficial femoral artery.  The patient's ulcerations are healed on her bilateral toes.  Just some areas of dry, flaky skin over top.  No open areas seen.  The patient is extremely hesitant to undergo another angiogram, therefore we will monitor the size of her peripheral vascular disease.  I have advised the patient that at this time we will monitor and manage conservatively.  However if she should begin to develop severe claudication-like symptoms, rest pain, or a recurrence of ulcers that an angiogram may be necessary.  Patient understood. - VAS [place] ABI WITH/WO TBI; Future  2. Essential hypertension Continue antihypertensive medications as already ordered, these medications have been reviewed and there are no changes at this time.   3. Mixed hyperlipidemia Continue statin as ordered and reviewed, no changes at this time   4. Lymphedema Patient has some edema today.  Patient has been unable to wear her compression socks due to ulcers on lower extremities.  However now the ulcers are generally healed.  Advised patient to try to utilize her compression socks now as much as possible.  Also remind her to elevate when she is able in addition to exercise.   Current Outpatient Medications on File Prior to Visit  Medication Sig Dispense Refill  . acetaminophen (TYLENOL) 325 MG tablet Take 650 mg by mouth every 4 (four) hours as needed for mild pain.     Marland Kitchen allopurinol (ZYLOPRIM) 100 MG tablet Take 100 mg by mouth daily.    Marland Kitchen amitriptyline (ELAVIL) 25 MG tablet Take 12.5 mg by mouth 2 (two) times daily.     Marland Kitchen aspirin EC 81 MG EC tablet Take 1 tablet (81 mg total) by mouth daily. 30 tablet 0  . atorvastatin (LIPITOR) 10 MG tablet TAKE 1 TABLET BY MOUTH ONCE DAILY **ADDITIONAL REFILLS PER THE PRIMARY CARE PROVIDER** 30 tablet 10  . Cholecalciferol (VITAMIN  D-3) 1000 UNITS CAPS Take 2,000 Units by mouth daily.     . clopidogrel (PLAVIX) 75 MG tablet Take 1 tablet (75 mg total) by mouth daily. 30 tablet 4  . DULoxetine (CYMBALTA) 30 MG capsule Take 30 mg by mouth every morning.    . furosemide (LASIX) 20 MG tablet Take 1 tablet (20 mg total) by mouth every other day. 30 tablet 0  . gabapentin (NEURONTIN) 400 MG capsule Take 400 mg by mouth 2 (two) times daily.     Marland Kitchen gabapentin (NEURONTIN) 600 MG tablet Take 900 mg by mouth at bedtime.     Marland Kitchen latanoprost (XALATAN) 0.005 % ophthalmic solution Place 1 drop into both eyes at bedtime.    Marland Kitchen levothyroxine (SYNTHROID, LEVOTHROID) 25 MCG tablet Take 25 mcg by mouth daily before breakfast.    . metoprolol tartrate (LOPRESSOR) 25 MG tablet Take 12.5 mg by mouth 2 (two) times daily.    . ondansetron (ZOFRAN) 4 MG tablet Take 4 mg by mouth every 8 (eight) hours as needed for nausea or vomiting.    . pantoprazole (PROTONIX) 40 MG tablet Take 40 mg by mouth 2 (two) times daily.     Marland Kitchen senna (SENOKOT) 8.6 MG tablet Take 1 tablet by mouth 3 (three) times daily as needed for constipation.    . topiramate (TOPAMAX) 25 MG tablet Take 25 mg by mouth at bedtime.    . traMADol (ULTRAM) 50 MG tablet Take 50 mg by mouth every 8 (eight) hours as needed (for pain control).  No current facility-administered medications on file prior to visit.     There are no Patient Instructions on file for this visit. Return in about 3 months (around 10/13/2018).   Kris Hartmann, NP  This note was completed with Sales executive.  Any errors are purely unintentional.

## 2018-09-02 ENCOUNTER — Ambulatory Visit (INDEPENDENT_AMBULATORY_CARE_PROVIDER_SITE_OTHER): Payer: Medicare Other | Admitting: Nurse Practitioner

## 2018-09-02 ENCOUNTER — Encounter (INDEPENDENT_AMBULATORY_CARE_PROVIDER_SITE_OTHER): Payer: Self-pay | Admitting: Nurse Practitioner

## 2018-09-02 ENCOUNTER — Ambulatory Visit (INDEPENDENT_AMBULATORY_CARE_PROVIDER_SITE_OTHER): Payer: Medicare Other

## 2018-09-02 VITALS — BP 115/61 | HR 81 | Resp 20 | Ht 63.0 in | Wt 210.2 lb

## 2018-09-02 DIAGNOSIS — I739 Peripheral vascular disease, unspecified: Secondary | ICD-10-CM | POA: Diagnosis not present

## 2018-09-02 DIAGNOSIS — E782 Mixed hyperlipidemia: Secondary | ICD-10-CM | POA: Diagnosis not present

## 2018-09-02 DIAGNOSIS — I7025 Atherosclerosis of native arteries of other extremities with ulceration: Secondary | ICD-10-CM

## 2018-09-02 DIAGNOSIS — I10 Essential (primary) hypertension: Secondary | ICD-10-CM | POA: Diagnosis not present

## 2018-09-02 NOTE — Progress Notes (Signed)
Subjective:    Patient ID: [name], female    DOB: 09-May-1928, 85 y.o.   MRN: [id number] Chief Complaint  Patient presents with  . Follow-up    HPI  [name] is a 85 y.o. female that per [place] today for follow-up of her peripheral vascular disease.  She has had previous peripheral vascular interventions, most recently on 05/18/2018 with a percutaneous transluminal angioplasty of the left peroneal artery.  At that time she had wounds on her lower extremities however at this time they are resolved.  She has complaints of pain in her knees and some weakness that may be residual to the pain in her knees or prior CVA.  She denies any claudication-like symptoms or rest pain.  She uses her walker for ambulation as much as she can when she is unable to she utilizes a wheelchair.  She denies any fever, chills, nausea, vomiting or diarrhea.  She denies any chest pain or shortness of breath.  She denies any TIA-like symptoms or amaurosis fugax.  Today she underwent ABIs which revealed an ABI of 0.69 on the right and an ABI of 0.87 on the left.  Her previous ABIs done on 07/13/2018 reveal an ABI of 0.69 on the right and 1.12 on the left.  Right lower extremity has monophasic tibial artery waveforms on her right lower extremity with a dampened right digit waveform.  Her left lower extremity has biphasic tibial waveforms with strong toe waveforms.  Past Medical History:  Diagnosis Date  . Abnormal glucose   . Anxiety   . Arthritis   . Asthma   . Calculus of gallbladder without mention of cholecystitis, with obstruction   . Cancer ([place])    kidney  . Candidiasis of the esophagus   . Cataract cortical, senile   . Chronic kidney disease, stage III (moderate) (HCC)   . Chronic pain   . Chronic pain syndrome   . Corns and callosities   . Debility   . Depression   . Dermatophytosis of nail   . Diabetes mellitus without complication ([place])    unspecified, without mention of  complication  . Difficult intubation   . Dysphagia   . Dyspnea    with exertion  . Edema, unspecified   . Emphysema lung ([place])   . Emphysema of lung ([place])   . Esophageal reflux   . Essential hypertension, benign   . Generalized atherosclerosis   . Glaucoma (increased eye pressure)   . Gout, unspecified   . Hemiplegia ([place])   . Hereditary and idiopathic peripheral neuropathy   . Hyperglycemia   . Hyperlipidemia   . Hypertension   . Hypoglycemia, unspecified   . Hypopotassemia   . Hypothyroidism   . Migraine, unspecified, without mention of intractable migraine without mention of status migrainosus   . Other vitamin B12 deficiency anemia   . Pain in limb   . Peripheral vascular disease ([place])   . Personal history of fall   . Stroke ([place])   . Thyroid disease   . Ulcer of foot ([place])   . Unspecified asthma(493.90)   . Unspecified constipation   . Unspecified hypertensive heart disease with heart failure(402.91)   . Unspecified urinary incontinence   . Unspecified vitamin D deficiency     Past Surgical History:  Procedure Laterality Date  . ABDOMINAL HYSTERECTOMY    . arterial thrombi stents in both legs    . BACK SURGERY    . BACK SURGERY    .  CATARACT EXTRACTION    . COLONOSCOPY    . ESOPHAGOGASTRODUODENOSCOPY (EGD) WITH PROPOFOL N/A 06/19/2016   Procedure: ESOPHAGOGASTRODUODENOSCOPY (EGD) WITH PROPOFOL;  Surgeon: Lollie Sails, MD;  [facility];  Service: Endoscopy;  Laterality: N/A;  . ESOPHAGOGASTRODUODENOSCOPY (EGD) WITH PROPOFOL N/A 04/10/2017   Procedure: ESOPHAGOGASTRODUODENOSCOPY (EGD) WITH PROPOFOL;  Surgeon: Lollie Sails, MD;  Location: [facility] ENDOSCOPY;  Service: Endoscopy;  Laterality: N/A;  . EYE SURGERY     cataract extraction  . LOWER EXTREMITY ANGIOGRAPHY Right 10/07/2016   Procedure: Lower Extremity Angiography;  Surgeon: Katha Cabal, MD;  Location: [place] CV LAB;  Service: Cardiovascular;  Laterality: Right;  . LOWER  EXTREMITY ANGIOGRAPHY Right 02/23/2018   Procedure: LOWER EXTREMITY ANGIOGRAPHY;  Surgeon: Katha Cabal, MD;  Location: [place] [facility];  Service: Cardiovascular;  Laterality: Right;  . LOWER EXTREMITY ANGIOGRAPHY Left 05/18/2018   Procedure: LOWER EXTREMITY ANGIOGRAPHY;  Surgeon: Katha Cabal, MD;  Location: [place] CV LAB;  Service: Cardiovascular;  Laterality: Left;  . PERIPHERAL VASCULAR CATHETERIZATION Left 09/02/2016   Procedure: Lower Extremity Angiography;  Surgeon: Katha Cabal, MD;  Location: [place] CV LAB;  Service: Cardiovascular;  Laterality: Left;  . PERIPHERAL VASCULAR CATHETERIZATION Right 09/16/2016   Procedure: Lower Extremity Angiography;  Surgeon: Katha Cabal, MD;  Location: [place] [facility];  Service: Cardiovascular;  Laterality: Right;    Social History   Socioeconomic History  . Marital status: Single    Spouse name: Not on file  . Number of children: Not on file  . Years of education: Not on file  . Highest education level: Not on file  Occupational History  . Occupation: retired  Scientific laboratory technician  . Financial resource strain: Not on file  . Food insecurity:    Worry: Not on file    Inability: Not on file  . Transportation needs:    Medical: Not on file    Non-medical: Not on file  Tobacco Use  . Smoking status: Never Smoker  . Smokeless tobacco: Never Used  Substance and Sexual Activity  . Alcohol use: No  . Drug use: No  . Sexual activity: Not Currently  Lifestyle  . Physical activity:    Days per week: Not on file    Minutes per session: Not on file  . Stress: Not on file  Relationships  . Social connections:    Talks on phone: Not on file    Gets together: Not on file    Attends religious service: Not on file    Active member of club or organization: Not on file    Attends meetings of clubs or organizations: Not on file    Relationship status: Not on file  . Intimate partner violence:    Fear of current  or ex partner: Not on file    Emotionally abused: Not on file    Physically abused: Not on file    Forced sexual activity: Not on file  Other Topics Concern  . Not on file  Social History Narrative  . Not on file    Family History  Problem Relation Age of Onset  . Cancer Father   . Stroke Father   . Seizures Mother   . Lung cancer Sister   . Bone cancer Sister     No Known Allergies   Review of Systems   Review of Systems: Negative Unless Checked Constitutional: [] Weight loss  [] Fever  [] Chills Cardiac: [] Chest pain   []  Atrial Fibrillation  []   Palpitations   [] Shortness of breath when laying flat   [] Shortness of breath with exertion. [] Shortness of breath at rest Vascular:  [] Pain in legs with walking   [] Pain in legs with standing [] Pain in legs when laying flat   [] Claudication    [] Pain in feet when laying flat    [] History of DVT   [] Phlebitis   [x] Swelling in legs   [] Varicose veins   [] Non-healing ulcers Pulmonary:   [] Uses home oxygen   [] Productive cough   [] Hemoptysis   [] Wheeze  [x] COPD   [] Asthma Neurologic:  [] Dizziness   [] Seizures  [] Blackouts [x] History of stroke   [] History of TIA  [] Aphasia   [] Temporary Blindness   [] Weakness or numbness in arm   [x] Weakness or numbness in leg Musculoskeletal:   [] Joint swelling   [] Joint pain   [] Low back pain  []  History of Knee Replacement [] Arthritis [] back Surgeries  []  Spinal Stenosis    Hematologic:  [] Easy bruising  [] Easy bleeding   [] Hypercoagulable state   [] Anemic Gastrointestinal:  [] Diarrhea   [] Vomiting  [] Gastroesophageal reflux/heartburn   [] Difficulty swallowing. [] Abdominal pain Genitourinary:  [] Chronic kidney disease   [] Difficult urination  [] Anuric   [] Blood in urine [] Frequent urination  [] Burning with urination   [] Hematuria Skin:  [] Rashes   [] Ulcers [] Wounds Psychological:  [] History of anxiety   []  History of major depression  []  Memory Difficulties     Objective:   Physical Exam  BP 115/61 (BP  Location: Left Arm, Patient Position: Sitting)   Pulse 81   Resp 20   Ht 5\' 3"  (1.6 m)   Wt 210 lb 3.2 oz (95.3 kg)   LMP  (LMP Unknown)   BMI 37.24 kg/m   Gen: WD/WN, NAD Head: [place]/AT, No temporalis wasting.  Ear/Nose/Throat: Hearing grossly intact, nares w/o erythema or drainage Eyes: PER, EOMI, sclera nonicteric.  Neck: Supple, no masses.  No JVD.  Pulmonary:  Good air movement, no use of accessory muscles.  Cardiac: RRR Vascular:  2+ soft edema bilaterally Vessel Right Left  Radial Palpable Palpable  Dorsalis Pedis  Not palpable Not Palpable  Posterior Tibial Not Palpable Not Palpable   Gastrointestinal: soft, non-distended. No guarding/no peritoneal signs.  Musculoskeletal:  Ambulates with a walker.  Frequently uses wheelchair  neurologic: Pain and light touch intact in extremities.  Symmetrical.  Speech is fluent. Motor exam as listed above. Psychiatric: Judgment intact, Mood & affect appropriate for pt's clinical situation. Dermatologic: No Venous rashes. No Ulcers Noted.  No changes consistent with cellulitis. Lymph : No Cervical lymphadenopathy, no lichenification or skin changes of chronic lymphedema.      Assessment & Plan:   1. PVD (peripheral vascular disease) ([place]) Today she underwent ABIs which revealed an ABI of 0.69 on the right and an ABI of 0.87 on the left.  Her previous ABIs done on 07/13/2018 reveal an ABI of 0.69 on the right and 1.12 on the left.  Right lower extremity has monophasic tibial artery waveforms on her right lower extremity with a dampened right digit waveform.  Her left lower extremity has biphasic tibial waveforms with strong toe waveforms.  The patient's previous wounds are all healed.  She has no new wounds to speak of.  The patient is distant to any intervention at this time.  She denies any claudication-like symptoms or rest pain.  The weakness that she has is likely more musculoskeletal.  We will have her return in 3 months with  noninvasive studies to continue to monitor  her status. 2. Essential hypertension Continue antihypertensive medications as already ordered, these medications have been reviewed and there are no changes at this time.   3. Mixed hyperlipidemia Continue statin as ordered and reviewed, no changes at this time    Current Outpatient Medications on File Prior to Visit  Medication Sig Dispense Refill  . acetaminophen (TYLENOL) 325 MG tablet Take 650 mg by mouth every 4 (four) hours as needed for mild pain.     Marland Kitchen allopurinol (ZYLOPRIM) 100 MG tablet Take 100 mg by mouth daily.    Marland Kitchen amitriptyline (ELAVIL) 25 MG tablet Take 12.5 mg by mouth 2 (two) times daily.     Marland Kitchen aspirin EC 81 MG EC tablet Take 1 tablet (81 mg total) by mouth daily. 30 tablet 0  . atorvastatin (LIPITOR) 10 MG tablet TAKE 1 TABLET BY MOUTH ONCE DAILY **ADDITIONAL REFILLS PER THE PRIMARY CARE PROVIDER** 30 tablet 10  . Cholecalciferol (VITAMIN D-3) 1000 UNITS CAPS Take 2,000 Units by mouth daily.     . clopidogrel (PLAVIX) 75 MG tablet Take 1 tablet (75 mg total) by mouth daily. 30 tablet 4  . DULoxetine (CYMBALTA) 30 MG capsule Take 30 mg by mouth every morning.    . furosemide (LASIX) 20 MG tablet Take 1 tablet (20 mg total) by mouth every other day. 30 tablet 0  . gabapentin (NEURONTIN) 400 MG capsule Take 400 mg by mouth 2 (two) times daily.     Marland Kitchen gabapentin (NEURONTIN) 600 MG tablet Take 900 mg by mouth at bedtime.     Marland Kitchen latanoprost (XALATAN) 0.005 % ophthalmic solution Place 1 drop into both eyes at bedtime.    Marland Kitchen levothyroxine (SYNTHROID, LEVOTHROID) 25 MCG tablet Take 25 mcg by mouth daily before breakfast.    . metoprolol tartrate (LOPRESSOR) 25 MG tablet Take 12.5 mg by mouth 2 (two) times daily.    . pantoprazole (PROTONIX) 40 MG tablet Take 40 mg by mouth 2 (two) times daily.     Marland Kitchen senna (SENOKOT) 8.6 MG tablet Take 1 tablet by mouth 3 (three) times daily as needed for constipation.    . topiramate (TOPAMAX) 25 MG tablet  Take 25 mg by mouth at bedtime.    . traMADol (ULTRAM) 50 MG tablet Take 50 mg by mouth every 8 (eight) hours as needed (for pain control).      No current facility-administered medications on file prior to visit.     There are no Patient Instructions on file for this visit. Return in about 3 months (around 12/02/2018).   Kris Hartmann, NP  This note was completed with Sales executive.  Any errors are purely unintentional.

## 2018-10-09 ENCOUNTER — Inpatient Hospital Stay
Admission: EM | Admit: 2018-10-09 | Discharge: 2018-10-13 | DRG: 291 | Disposition: A | Discharge status: Skilled Nursing Facility | Payer: Medicare Other | Source: Skilled Nursing Facility | Attending: Internal Medicine | Admitting: Internal Medicine

## 2018-10-09 ENCOUNTER — Emergency Department: Payer: Medicare Other

## 2018-10-09 ENCOUNTER — Other Ambulatory Visit: Payer: Self-pay

## 2018-10-09 DIAGNOSIS — E785 Hyperlipidemia, unspecified: Secondary | ICD-10-CM | POA: Diagnosis present

## 2018-10-09 DIAGNOSIS — Z66 Do not resuscitate: Secondary | ICD-10-CM | POA: Diagnosis present

## 2018-10-09 DIAGNOSIS — Z801 Family history of malignant neoplasm of trachea, bronchus and lung: Secondary | ICD-10-CM

## 2018-10-09 DIAGNOSIS — L8990 Pressure ulcer of unspecified site, unspecified stage: Secondary | ICD-10-CM

## 2018-10-09 DIAGNOSIS — Z8673 Personal history of transient ischemic attack (TIA), and cerebral infarction without residual deficits: Secondary | ICD-10-CM

## 2018-10-09 DIAGNOSIS — E039 Hypothyroidism, unspecified: Secondary | ICD-10-CM | POA: Diagnosis present

## 2018-10-09 DIAGNOSIS — H409 Unspecified glaucoma: Secondary | ICD-10-CM | POA: Diagnosis present

## 2018-10-09 DIAGNOSIS — Z79890 Hormone replacement therapy (postmenopausal): Secondary | ICD-10-CM | POA: Diagnosis not present

## 2018-10-09 DIAGNOSIS — J810 Acute pulmonary edema: Secondary | ICD-10-CM

## 2018-10-09 DIAGNOSIS — K219 Gastro-esophageal reflux disease without esophagitis: Secondary | ICD-10-CM | POA: Diagnosis present

## 2018-10-09 DIAGNOSIS — I509 Heart failure, unspecified: Secondary | ICD-10-CM

## 2018-10-09 DIAGNOSIS — I130 Hypertensive heart and chronic kidney disease with heart failure and stage 1 through stage 4 chronic kidney disease, or unspecified chronic kidney disease: Principal | ICD-10-CM | POA: Diagnosis present

## 2018-10-09 DIAGNOSIS — Z7982 Long term (current) use of aspirin: Secondary | ICD-10-CM | POA: Diagnosis not present

## 2018-10-09 DIAGNOSIS — R531 Weakness: Secondary | ICD-10-CM

## 2018-10-09 DIAGNOSIS — I5033 Acute on chronic diastolic (congestive) heart failure: Secondary | ICD-10-CM | POA: Diagnosis present

## 2018-10-09 DIAGNOSIS — N184 Chronic kidney disease, stage 4 (severe): Secondary | ICD-10-CM | POA: Diagnosis present

## 2018-10-09 DIAGNOSIS — Z823 Family history of stroke: Secondary | ICD-10-CM | POA: Diagnosis not present

## 2018-10-09 DIAGNOSIS — M109 Gout, unspecified: Secondary | ICD-10-CM | POA: Diagnosis present

## 2018-10-09 DIAGNOSIS — R262 Difficulty in walking, not elsewhere classified: Secondary | ICD-10-CM | POA: Diagnosis present

## 2018-10-09 DIAGNOSIS — E1151 Type 2 diabetes mellitus with diabetic peripheral angiopathy without gangrene: Secondary | ICD-10-CM | POA: Diagnosis present

## 2018-10-09 DIAGNOSIS — Z7902 Long term (current) use of antithrombotics/antiplatelets: Secondary | ICD-10-CM

## 2018-10-09 DIAGNOSIS — E1122 Type 2 diabetes mellitus with diabetic chronic kidney disease: Secondary | ICD-10-CM | POA: Diagnosis present

## 2018-10-09 DIAGNOSIS — I081 Rheumatic disorders of both mitral and tricuspid valves: Secondary | ICD-10-CM | POA: Diagnosis present

## 2018-10-09 LAB — TROPONIN I
Troponin I: <0.03 ng/mL (ref <0.03)
Troponin I: <0.03 ng/mL (ref <0.03)
Troponin I: <0.03 ng/mL (ref <0.03)

## 2018-10-09 LAB — COMPREHENSIVE METABOLIC PANEL
ALT: 7 U/L (ref 0–44)
AST: 25 U/L (ref 15–41)
Albumin: 3.1 g/dL — ABNORMAL LOW (ref 3.5–5.0)
Alkaline Phosphatase: 136 U/L — ABNORMAL HIGH (ref 38–126)
Anion gap: 10 (ref 5–15)
BILIRUBIN TOTAL: 0.8 mg/dL (ref 0.3–1.2)
BUN: 20 mg/dL (ref 8–23)
CO2: 25 mmol/L (ref 22–32)
Calcium: 8.5 mg/dL — ABNORMAL LOW (ref 8.9–10.3)
Chloride: 102 mmol/L (ref 98–111)
Creatinine, Ser: 1.47 mg/dL — ABNORMAL HIGH (ref 0.44–1.00)
GFR calc Af Amer: 37 mL/min — ABNORMAL LOW (ref >60)
GFR calc non Af Amer: 32 mL/min — ABNORMAL LOW (ref >60)
Glucose, Bld: 88 mg/dL (ref 70–99)
Potassium: 4.3 mmol/L (ref 3.5–5.1)
Sodium: 137 mmol/L (ref 135–145)
TOTAL PROTEIN: 7.2 g/dL (ref 6.5–8.1)

## 2018-10-09 LAB — URINALYSIS, COMPLETE (UACMP) WITH MICROSCOPIC
Bacteria, UA: NONE SEEN
Bilirubin Urine: NEGATIVE
Glucose, UA: NEGATIVE mg/dL
HGB URINE DIPSTICK: NEGATIVE
Ketones, ur: NEGATIVE mg/dL
LEUKOCYTE UA: NEGATIVE
NITRITE: NEGATIVE
Protein, ur: NEGATIVE mg/dL
Specific Gravity, Urine: 1.015 (ref 1.005–1.030)
pH: 7 (ref 5.0–8.0)

## 2018-10-09 LAB — MRSA PCR SCREENING: MRSA by PCR: NEGATIVE

## 2018-10-09 LAB — CBC
HCT: 31.2 % — ABNORMAL LOW (ref 36.0–46.0)
Hemoglobin: 9.6 g/dL — ABNORMAL LOW (ref 12.0–15.0)
MCH: 29.1 pg (ref 26.0–34.0)
MCHC: 30.8 g/dL (ref 30.0–36.0)
MCV: 94.5 fL (ref 80.0–100.0)
NRBC: 0 % (ref 0.0–0.2)
Platelets: 198 10*3/uL (ref 150–400)
RBC: 3.3 MIL/uL — ABNORMAL LOW (ref 3.87–5.11)
RDW: 17.3 % — ABNORMAL HIGH (ref 11.5–15.5)
WBC: 6.4 10*3/uL (ref 4.0–10.5)

## 2018-10-09 LAB — INFLUENZA PANEL BY PCR (TYPE A & B)
INFLBPCR: NEGATIVE
Influenza A By PCR: NEGATIVE

## 2018-10-09 LAB — BRAIN NATRIURETIC PEPTIDE: B Natriuretic Peptide: 210 pg/mL — ABNORMAL HIGH (ref 0.0–100.0)

## 2018-10-09 LAB — LIPASE, BLOOD: Lipase: 27 U/L (ref 11–51)

## 2018-10-09 MED ORDER — ALLOPURINOL 100 MG PO TABS
100.0000 mg | ORAL_TABLET | Freq: Every day | ORAL | Status: DC
  Administered 2018-10-09 – 2018-10-13 (×5): 100 mg via ORAL
  Filled 2018-10-09 (×5): qty 1

## 2018-10-09 MED ORDER — PANTOPRAZOLE SODIUM 40 MG PO TBEC
40.0000 mg | DELAYED_RELEASE_TABLET | Freq: Every day | ORAL | Status: DC
  Administered 2018-10-09 – 2018-10-13 (×5): 40 mg via ORAL
  Filled 2018-10-09 (×5): qty 1

## 2018-10-09 MED ORDER — ENOXAPARIN SODIUM 40 MG/0.4ML ~~LOC~~ SOLN
40.0000 mg | SUBCUTANEOUS | Status: DC
  Administered 2018-10-09 – 2018-10-10 (×2): 40 mg via SUBCUTANEOUS
  Filled 2018-10-09 (×2): qty 0.4

## 2018-10-09 MED ORDER — SENNA 8.6 MG PO TABS
2.0000 | ORAL_TABLET | Freq: Two times a day (BID) | ORAL | Status: DC
  Filled 2018-10-09: qty 2

## 2018-10-09 MED ORDER — TRAMADOL HCL 50 MG PO TABS
50.0000 mg | ORAL_TABLET | Freq: Three times a day (TID) | ORAL | Status: DC | PRN
  Administered 2018-10-10 (×2): 50 mg via ORAL
  Filled 2018-10-09 (×2): qty 1

## 2018-10-09 MED ORDER — SODIUM CHLORIDE 0.9 % IV SOLN: 250.0000 mL | INTRAVENOUS | Status: DC | PRN

## 2018-10-09 MED ORDER — METOPROLOL TARTRATE 25 MG PO TABS
25.0000 mg | ORAL_TABLET | Freq: Two times a day (BID) | ORAL | Status: DC
  Administered 2018-10-09 – 2018-10-13 (×7): 25 mg via ORAL
  Filled 2018-10-09 (×7): qty 1

## 2018-10-09 MED ORDER — LEVOTHYROXINE SODIUM 25 MCG PO TABS
25.0000 ug | ORAL_TABLET | Freq: Every day | ORAL | Status: DC
  Administered 2018-10-10 – 2018-10-13 (×4): 25 ug via ORAL
  Filled 2018-10-09 (×4): qty 1

## 2018-10-09 MED ORDER — ATORVASTATIN CALCIUM 20 MG PO TABS
20.0000 mg | ORAL_TABLET | Freq: Every day | ORAL | Status: DC
  Administered 2018-10-09 – 2018-10-13 (×5): 20 mg via ORAL
  Filled 2018-10-09 (×5): qty 1

## 2018-10-09 MED ORDER — VITAMIN D3 25 MCG (1000 UNIT) PO TABS
2000.0000 [IU] | ORAL_TABLET | Freq: Every day | ORAL | Status: DC
  Administered 2018-10-09 – 2018-10-13 (×5): 2000 [IU] via ORAL
  Filled 2018-10-09 (×5): qty 2

## 2018-10-09 MED ORDER — ASPIRIN 81 MG PO CHEW
81.0000 mg | CHEWABLE_TABLET | Freq: Every day | ORAL | Status: DC
  Administered 2018-10-09 – 2018-10-13 (×5): 81 mg via ORAL
  Filled 2018-10-09 (×5): qty 1

## 2018-10-09 MED ORDER — FUROSEMIDE 10 MG/ML IJ SOLN
40.0000 mg | Freq: Two times a day (BID) | INTRAMUSCULAR | Status: DC
  Administered 2018-10-10 – 2018-10-11 (×3): 40 mg via INTRAVENOUS
  Filled 2018-10-09 (×3): qty 4

## 2018-10-09 MED ORDER — GABAPENTIN 400 MG PO CAPS
400.0000 mg | ORAL_CAPSULE | Freq: Two times a day (BID) | ORAL | Status: DC
  Administered 2018-10-10 – 2018-10-13 (×7): 400 mg via ORAL
  Filled 2018-10-09 (×7): qty 1

## 2018-10-09 MED ORDER — GABAPENTIN 300 MG PO CAPS
900.0000 mg | ORAL_CAPSULE | Freq: Every day | ORAL | Status: DC
  Administered 2018-10-09 – 2018-10-12 (×4): 900 mg via ORAL
  Filled 2018-10-09 (×4): qty 3

## 2018-10-09 MED ORDER — SODIUM CHLORIDE 0.9% FLUSH
3.0000 mL | Freq: Two times a day (BID) | INTRAVENOUS | Status: DC
  Administered 2018-10-09 – 2018-10-13 (×8): 3 mL via INTRAVENOUS

## 2018-10-09 MED ORDER — POLYETHYLENE GLYCOL 3350 17 G PO PACK: 17.0000 g | PACK | Freq: Every day | ORAL | Status: DC | PRN

## 2018-10-09 MED ORDER — CLOPIDOGREL BISULFATE 75 MG PO TABS
75.0000 mg | ORAL_TABLET | Freq: Every day | ORAL | Status: DC
  Administered 2018-10-09 – 2018-10-13 (×5): 75 mg via ORAL
  Filled 2018-10-09 (×5): qty 1

## 2018-10-09 MED ORDER — SODIUM CHLORIDE 0.9% FLUSH: 3.0000 mL | INTRAVENOUS | Status: DC | PRN

## 2018-10-09 MED ORDER — LISINOPRIL 5 MG PO TABS
5.0000 mg | ORAL_TABLET | Freq: Every day | ORAL | Status: DC
  Administered 2018-10-09 – 2018-10-13 (×5): 5 mg via ORAL
  Filled 2018-10-09 (×5): qty 1

## 2018-10-09 MED ORDER — LATANOPROST 0.005 % OP SOLN
1.0000 [drp] | Freq: Every day | OPHTHALMIC | Status: DC
  Administered 2018-10-10 – 2018-10-12 (×3): 1 [drp] via OPHTHALMIC
  Filled 2018-10-09: qty 2.5

## 2018-10-09 MED ORDER — ACETAMINOPHEN 325 MG PO TABS: 650.0000 mg | ORAL_TABLET | ORAL | Status: DC | PRN

## 2018-10-09 MED ORDER — DULOXETINE HCL 30 MG PO CPEP
30.0000 mg | ORAL_CAPSULE | Freq: Every day | ORAL | Status: DC
  Administered 2018-10-09 – 2018-10-13 (×5): 30 mg via ORAL
  Filled 2018-10-09 (×5): qty 1

## 2018-10-09 MED ORDER — POTASSIUM CHLORIDE CRYS ER 10 MEQ PO TBCR
10.0000 meq | EXTENDED_RELEASE_TABLET | Freq: Every day | ORAL | Status: DC
  Administered 2018-10-10 – 2018-10-13 (×4): 10 meq via ORAL
  Filled 2018-10-09 (×4): qty 1

## 2018-10-09 MED ORDER — TOPIRAMATE 25 MG PO TABS
25.0000 mg | ORAL_TABLET | Freq: Every day | ORAL | Status: DC
  Administered 2018-10-09 – 2018-10-12 (×4): 25 mg via ORAL
  Filled 2018-10-09 (×5): qty 1

## 2018-10-09 MED ORDER — AMITRIPTYLINE HCL 25 MG PO TABS
12.5000 mg | ORAL_TABLET | Freq: Two times a day (BID) | ORAL | Status: DC
  Administered 2018-10-09 – 2018-10-13 (×8): 12.5 mg via ORAL
  Filled 2018-10-09 (×8): qty 1

## 2018-10-09 MED ORDER — FUROSEMIDE 10 MG/ML IJ SOLN
60.0000 mg | Freq: Once | INTRAMUSCULAR | Status: AC
  Administered 2018-10-09: 60 mg via INTRAVENOUS
  Filled 2018-10-09: qty 8

## 2018-10-09 MED ORDER — ONDANSETRON HCL 4 MG/2ML IJ SOLN: 4.0000 mg | Freq: Four times a day (QID) | INTRAMUSCULAR | Status: DC | PRN

## 2018-10-09 MED ORDER — SODIUM CHLORIDE 0.9 % IV BOLUS
500.0000 mL | Freq: Once | INTRAVENOUS | Status: AC
  Administered 2018-10-09: 500 mL via INTRAVENOUS

## 2018-10-09 MED ORDER — SENNA 8.6 MG PO TABS
2.0000 | ORAL_TABLET | Freq: Two times a day (BID) | ORAL | Status: DC
  Administered 2018-10-09 – 2018-10-13 (×5): 17.2 mg via ORAL
  Filled 2018-10-09 (×7): qty 2

## 2018-10-09 NOTE — ED Notes (Signed)
ED TO INPATIENT HANDOFF REPORT  Name/Age/Gender [name] 85 y.o. female  Code Status Code Status History    Date Active Date Inactive Code Status Order ID Comments User Context   05/18/2018 1259 05/18/2018 1739 Full Code [id number]  Katha Cabal, MD Inpatient   02/23/2018 1201 02/23/2018 1644 Full Code [id number]  Katha Cabal, MD Inpatient   12/09/2017 1646 12/11/2017 1850 DNR [id number]  Demetrios Loll, MD Inpatient   05/03/2017 0349 05/04/2017 2209 Full Code [id number]  Saundra Shelling, MD Inpatient   10/07/2016 1203 10/07/2016 1738 Full Code [id number]  Delana Meyer, Dolores Lory, MD Inpatient   09/16/2016 1027 09/16/2016 1711 Full Code [id number]  Katha Cabal, MD Inpatient   09/02/2016 1051 09/02/2016 1601 Full Code [id number]  Katha Cabal, MD Inpatient   07/22/2016 1002 07/22/2016 2318 DNR [id number]  Fritzi Mandes, MD Inpatient   07/20/2016 1455 07/22/2016 1002 Full Code [id number]  Hillary Bow, MD ED      Home/SNF/Other [place]  Chief Complaint Generalized weakness   Level of Care/Admitting Diagnosis ED Disposition    ED Disposition Condition [facility] Area: [place] [[id number]]  Level of Care: Telemetry [5]  Diagnosis: CHF (congestive heart failure) [facility]) [[id number]]  Admitting Physician: Henreitta Leber [[id number]]  Attending Physician: Henreitta Leber [phone]  Estimated length of stay: past midnight tomorrow  Certification:: I certify this patient will need inpatient services for at least 2 midnights  PT Class (Do Not Modify): Inpatient [101]  PT Acc Code (Do Not Modify): Private [1]       Medical History Past Medical History:  Diagnosis Date  . Abnormal glucose   . Anxiety   . Arthritis   . Asthma   . Calculus of gallbladder without mention of cholecystitis, with obstruction   . Cancer ([place])    kidney  . Candidiasis of the esophagus   . Cataract cortical, senile   . Chronic kidney disease, stage III (moderate) (HCC)    . Chronic pain   . Chronic pain syndrome   . Corns and callosities   . Debility   . Depression   . Dermatophytosis of nail   . Diabetes mellitus without complication ([place])    unspecified, without mention of complication  . Difficult intubation   . Dysphagia   . Dyspnea    with exertion  . Edema, unspecified   . Emphysema lung ([place])   . Emphysema of lung ([place])   . Esophageal reflux   . Essential hypertension, benign   . Generalized atherosclerosis   . Glaucoma (increased eye pressure)   . Gout, unspecified   . Hemiplegia ([place])   . Hereditary and idiopathic peripheral neuropathy   . Hyperglycemia   . Hyperlipidemia   . Hypertension   . Hypoglycemia, unspecified   . Hypopotassemia   . Hypothyroidism   . Migraine, unspecified, without mention of intractable migraine without mention of status migrainosus   . Other vitamin B12 deficiency anemia   . Pain in limb   . Peripheral vascular disease ([place])   . Personal history of fall   . Stroke ([place])   . Thyroid disease   . Ulcer of foot ([place])   . Unspecified asthma(493.90)   . Unspecified constipation   . Unspecified hypertensive heart disease with heart failure(402.91)   . Unspecified urinary incontinence   . Unspecified vitamin D deficiency     Allergies No Known Allergies  IV Location/Drains/Wounds Patient Lines/Drains/Airways Status   Active  Line/Drains/Airways    Name:   Placement date:   Placement time:   Site:   Days:   Peripheral IV 10/09/18 Left Hand   10/09/18    1036    Hand   less than 1          Labs/Imaging Results for orders placed or performed during the hospital encounter of 10/09/18 (from the past 48 hour(s))  CBC     Status: Abnormal   Collection Time: 10/09/18 10:35 AM  Result Value Ref Range   WBC 6.4 4.0 - 10.5 K/uL   RBC 3.30 (L) 3.87 - 5.11 MIL/uL   Hemoglobin 9.6 (L) 12.0 - 15.0 g/dL   HCT 31.2 (L) 36.0 - 46.0 %   MCV 94.5 80.0 - 100.0 fL   MCH 29.1 26.0 - 34.0 pg   MCHC 30.8 30.0 -  36.0 g/dL   RDW 17.3 (H) 11.5 - 15.5 %   Platelets 198 150 - 400 K/uL   nRBC 0.0 0.0 - 0.2 %    Comment: Performed at [facility], [address] [place], [place] [address]  Brain natriuretic peptide     Status: Abnormal   Collection Time: 10/09/18 10:35 AM  Result Value Ref Range   B Natriuretic Peptide 210.0 (H) 0.0 - 100.0 pg/mL    Comment: Performed at [facility], [address] [place], [place] [address]  Comprehensive metabolic panel     Status: Abnormal   Collection Time: 10/09/18 11:15 AM  Result Value Ref Range   Sodium 137 135 - 145 mmol/L   Potassium 4.3 3.5 - 5.1 mmol/L    Comment: HEMOLYSIS AT THIS LEVEL MAY AFFECT RESULT   Chloride 102 98 - 111 mmol/L   CO2 25 22 - 32 mmol/L   Glucose, Bld 88 70 - 99 mg/dL   BUN 20 8 - 23 mg/dL   Creatinine, Ser 1.47 (H) 0.44 - 1.00 mg/dL   Calcium 8.5 (L) 8.9 - 10.3 mg/dL   Total Protein 7.2 6.5 - 8.1 g/dL   Albumin 3.1 (L) 3.5 - 5.0 g/dL   AST 25 15 - 41 U/L   ALT 7 0 - 44 U/L   Alkaline Phosphatase 136 (H) 38 - 126 U/L   Total Bilirubin 0.8 0.3 - 1.2 mg/dL   GFR calc non Af Amer 32 (L) >60 mL/min   GFR calc Af Amer 37 (L) >60 mL/min   Anion gap 10 5 - 15    Comment: Performed at [facility], [address] [place], [place] [address]  Lipase, blood     Status: None   Collection Time: 10/09/18 11:15 AM  Result Value Ref Range   Lipase 27 11 - 51 U/L    Comment: Performed at [facility], [address] [place], [place] [address]  Troponin I -     Status: None   Collection Time: 10/09/18 11:15 AM  Result Value Ref Range   Troponin I <0.03 <0.03 ng/mL    Comment: Performed at [facility], [address] [place], [place] [address]  Urinalysis, Complete w Microscopic     Status: Abnormal   Collection Time: 10/09/18 12:16 PM  Result Value Ref Range   Color, Urine YELLOW (A) YELLOW   APPearance CLEAR (A) CLEAR   Specific Gravity, Urine 1.015 1.005 - 1.030   pH 7.0 5.0  - 8.0   Glucose, UA NEGATIVE NEGATIVE mg/dL   Hgb urine dipstick NEGATIVE NEGATIVE   Bilirubin Urine NEGATIVE  NEGATIVE   Ketones, ur NEGATIVE NEGATIVE mg/dL   Protein, ur NEGATIVE NEGATIVE mg/dL   Nitrite NEGATIVE NEGATIVE   Leukocytes,Ua NEGATIVE NEGATIVE   RBC / HPF 0-5 0 - 5 RBC/hpf   WBC, UA 0-5 0 - 5 WBC/hpf   Bacteria, UA NONE SEEN NONE SEEN   Squamous Epithelial / LPF 0-5 0 - 5   Mucus PRESENT     Comment: Performed at [facility], [address], [place], [place] [address]  Influenza panel by PCR (type A & B)     Status: None   Collection Time: 10/09/18  2:31 PM  Result Value Ref Range   Influenza A By PCR NEGATIVE NEGATIVE   Influenza B By PCR NEGATIVE NEGATIVE    Comment: (NOTE) The Xpert Xpress Flu assay is intended as an aid in the diagnosis of  influenza and should not be used as a sole basis for treatment.  This  assay is FDA approved for nasopharyngeal swab specimens only. Nasal  washings and aspirates are unacceptable for Xpert Xpress Flu testing. Performed at [facility], [address] [place], [place] [address]    Dg Chest 2 View  Result Date: 10/09/2018 CLINICAL DATA:  Shortness of breath and wheezing for a couple of weeks. EXAM: CHEST - 2 VIEW COMPARISON:  12/09/2017 FINDINGS: Enlarged cardiac silhouette. Calcific atherosclerotic disease and tortuosity of the aorta. Coronary artery calcifications. There is no evidence of pneumothorax. Increased interstitial markings. Possible right pleural effusion. Right lower lobe atelectasis versus airspace consolidation. Osseous structures are without acute abnormality. Soft tissues are grossly normal. IMPRESSION: 1. Enlarged cardiac silhouette represents a change from patient's prior chest x-ray dated 12/09/2017. This may represent cardiomegaly/cardiomyopathy versus pericardial effusion. 2. Interstitial pulmonary edema. 3. Possible right pleural effusion. 4. Right lower lobe atelectasis versus airspace  consolidation. Electronically Signed   By: Fidela Salisbury M.D.   On: 10/09/2018 11:11    Pending Labs [employer] (From admission, onward)    Start     Ordered   Signed and Corporate treasurer  Daily,   R     Signed and Held   Signed and Held  CBC  (enoxaparin (LOVENOX)    CrCl >/= 30 ml/min)  Once,   R    Comments:  Baseline for enoxaparin therapy IF NOT ALREADY DRAWN.  Notify MD if PLT < 100 K.    Signed and Held   Signed and Held  Creatinine, serum  (enoxaparin (LOVENOX)    CrCl >/= 30 ml/min)  Once,   R    Comments:  Baseline for enoxaparin therapy IF NOT ALREADY DRAWN.    Signed and Held   Signed and Held  Creatinine, serum  (enoxaparin (LOVENOX)    CrCl >/= 30 ml/min)  Weekly,   R    Comments:  while on enoxaparin therapy    Signed and Held   Signed and Held  Troponin I - Now Then Q6H  Now then every 6 hours,   R     Signed and Held          Vitals/Pain Today's Vitals   10/09/18 1400 10/09/18 1430 10/09/18 1442 10/09/18 1500  BP: (!) 146/73 (!) 152/73  (!) 142/80  Pulse:   (!) 103 (!) 107  Resp: 10 10 20 20   Temp:      TempSrc:      SpO2:  100% 100% 98%  Weight:      Height:  PainSc:        Isolation Precautions No active isolations  Medications Medications  sodium chloride 0.9 % bolus 500 mL (500 mLs Intravenous Bolus 10/09/18 1119)  furosemide (LASIX) injection 60 mg (60 mg Intravenous Given 10/09/18 1551)    Mobility unknown

## 2018-10-09 NOTE — ED Provider Notes (Signed)
[facility] Emergency Department Provider Note  Time seen: 10:42 AM  I have reviewed the triage vital signs and the nursing notes.   HISTORY  Chief Complaint Weakness    HPI [name] is a 85 y.o. female with a past medical history of anxiety, chronic pain, depression, peripheral edema, CKD, presents to the emergency department for generalized fatigue/weakness.  According to the patient and report patient has been feeling very weak ever since awakening this morning, had a bowel movement on herself which is abnormal.  Patient states it was loose/diarrhea.  Denies any nausea or vomiting.  Denies any chest or abdominal pain.  Denies any fever cough or congestion.  Patient normally lives in the more independent living area of [place].  Overall patient appears well, no acute distress.  In bed, calm cooperative pleasant.   Past Medical History:  Diagnosis Date  . Abnormal glucose   . Anxiety   . Arthritis   . Asthma   . Calculus of gallbladder without mention of cholecystitis, with obstruction   . Cancer ([place])    kidney  . Candidiasis of the esophagus   . Cataract cortical, senile   . Chronic kidney disease, stage III (moderate) (HCC)   . Chronic pain   . Chronic pain syndrome   . Corns and callosities   . Debility   . Depression   . Dermatophytosis of nail   . Diabetes mellitus without complication ([place])    unspecified, without mention of complication  . Difficult intubation   . Dysphagia   . Dyspnea    with exertion  . Edema, unspecified   . Emphysema lung ([place])   . Emphysema of lung ([place])   . Esophageal reflux   . Essential hypertension, benign   . Generalized atherosclerosis   . Glaucoma (increased eye pressure)   . Gout, unspecified   . Hemiplegia ([place])   . Hereditary and idiopathic peripheral neuropathy   . Hyperglycemia   . Hyperlipidemia   . Hypertension   . Hypoglycemia, unspecified   . Hypopotassemia   . Hypothyroidism    . Migraine, unspecified, without mention of intractable migraine without mention of status migrainosus   . Other vitamin B12 deficiency anemia   . Pain in limb   . Peripheral vascular disease ([place])   . Personal history of fall   . Stroke ([place])   . Thyroid disease   . Ulcer of foot ([place])   . Unspecified asthma(493.90)   . Unspecified constipation   . Unspecified hypertensive heart disease with heart failure(402.91)   . Unspecified urinary incontinence   . Unspecified vitamin D deficiency     Patient Active Problem List   Diagnosis Date Noted  . Cancer of kidney ([place]) 02/10/2018  . Cataract cortical, senile 02/10/2018  . Emphysema lung ([place]) 02/10/2018  . Glaucoma (increased eye pressure) 02/10/2018  . Ulcer 02/10/2018  . GIB (gastrointestinal bleeding) 12/09/2017  . Chronic venous insufficiency 09/02/2017  . Lymphedema 09/02/2017  . Varicose veins of both lower extremities with pain 07/29/2017  . Hyperlipidemia 05/04/2017  . Hypothyroidism 05/04/2017  . Acute CVA (cerebrovascular accident) ([place]) 05/04/2017  . Gout 10/27/2016  . CVA (cerebral vascular accident) ([place]) 07/20/2016  . Essential hypertension 07/14/2016  . Atherosclerosis of native arteries of the extremities with ulceration ([place]) 07/14/2016  . PVD (peripheral vascular disease) ([place]) 07/14/2016    Past Surgical History:  Procedure Laterality Date  . ABDOMINAL HYSTERECTOMY    . arterial thrombi stents in both  legs    . BACK SURGERY    . BACK SURGERY    . CATARACT EXTRACTION    . COLONOSCOPY    . ESOPHAGOGASTRODUODENOSCOPY (EGD) WITH PROPOFOL N/A 06/19/2016   Procedure: ESOPHAGOGASTRODUODENOSCOPY (EGD) WITH PROPOFOL;  Surgeon: Lollie Sails, MD;  Location: [facility] ENDOSCOPY;  Service: Endoscopy;  Laterality: N/A;  . ESOPHAGOGASTRODUODENOSCOPY (EGD) WITH PROPOFOL N/A 04/10/2017   Procedure: ESOPHAGOGASTRODUODENOSCOPY (EGD) WITH PROPOFOL;  Surgeon: Lollie Sails, MD;  Location: [facility] ENDOSCOPY;  Service:  Endoscopy;  Laterality: N/A;  . EYE SURGERY     cataract extraction  . LOWER EXTREMITY ANGIOGRAPHY Right 10/07/2016   Procedure: Lower Extremity Angiography;  Surgeon: Katha Cabal, MD;  Location: [place] [facility];  Service: Cardiovascular;  Laterality: Right;  . LOWER EXTREMITY ANGIOGRAPHY Right 02/23/2018   Procedure: LOWER EXTREMITY ANGIOGRAPHY;  Surgeon: Katha Cabal, MD;  Location: [place] [facility];  Service: Cardiovascular;  Laterality: Right;  . LOWER EXTREMITY ANGIOGRAPHY Left 05/18/2018   Procedure: LOWER EXTREMITY ANGIOGRAPHY;  Surgeon: Katha Cabal, MD;  Location: [place] [facility];  Service: Cardiovascular;  Laterality: Left;  . PERIPHERAL VASCULAR CATHETERIZATION Left 09/02/2016   Procedure: Lower Extremity Angiography;  Surgeon: Katha Cabal, MD;  Location: [place] [facility];  Service: Cardiovascular;  Laterality: Left;  . PERIPHERAL VASCULAR CATHETERIZATION Right 09/16/2016   Procedure: Lower Extremity Angiography;  Surgeon: Katha Cabal, MD;  Location: [place] [facility];  Service: Cardiovascular;  Laterality: Right;    Prior to Admission medications   Medication Sig Start Date End Date Taking? Authorizing Provider  acetaminophen (TYLENOL) 325 MG tablet Take 650 mg by mouth every 4 (four) hours as needed for mild pain.     [provider]  allopurinol (ZYLOPRIM) 100 MG tablet Take 100 mg by mouth daily.    [provider]  amitriptyline (ELAVIL) 25 MG tablet Take 12.5 mg by mouth 2 (two) times daily.     [provider]  aspirin EC 81 MG EC tablet Take 1 tablet (81 mg total) by mouth daily. 07/22/16   Fritzi Mandes, MD  atorvastatin (LIPITOR) 10 MG tablet TAKE 1 TABLET BY MOUTH ONCE DAILY **ADDITIONAL REFILLS PER THE PRIMARY CARE PROVIDER** 06/23/18   Kris Hartmann, NP  Cholecalciferol (VITAMIN D-3) 1000 UNITS CAPS Take 2,000 Units by mouth daily.     [provider]  clopidogrel (PLAVIX) 75 MG tablet Take 1  tablet (75 mg total) by mouth daily. 02/24/18 02/24/19  Schnier, Dolores Lory, MD  DULoxetine (CYMBALTA) 30 MG capsule Take 30 mg by mouth every morning.    [provider]  furosemide (LASIX) 20 MG tablet Take 1 tablet (20 mg total) by mouth every other day. 07/22/16   Fritzi Mandes, MD  gabapentin (NEURONTIN) 400 MG capsule Take 400 mg by mouth 2 (two) times daily.  11/28/17   [provider]  gabapentin (NEURONTIN) 600 MG tablet Take 900 mg by mouth at bedtime.  07/11/17   [provider]  latanoprost (XALATAN) 0.005 % ophthalmic solution Place 1 drop into both eyes at bedtime.    [provider]  levothyroxine (SYNTHROID, LEVOTHROID) 25 MCG tablet Take 25 mcg by mouth daily before breakfast.    [provider]  metoprolol tartrate (LOPRESSOR) 25 MG tablet Take 12.5 mg by mouth 2 (two) times daily.    [provider]  pantoprazole (PROTONIX) 40 MG tablet Take 40 mg by mouth 2 (two) times daily.     [provider]  senna (SENOKOT) 8.6 MG tablet Take 1 tablet by mouth 3 (three) times daily as needed for constipation.    [provider]  topiramate (TOPAMAX) 25 MG tablet Take 25 mg by mouth at bedtime.    [provider]  traMADol (ULTRAM) 50 MG tablet Take 50 mg by mouth every 8 (eight) hours as needed (for pain control).     [provider]    No Known Allergies  Family History  Problem Relation Age of Onset  . Cancer Father   . Stroke Father   . Seizures Mother   . Lung cancer Sister   . Bone cancer Sister     Social History Social History   Tobacco Use  . Smoking status: Never Smoker  . Smokeless tobacco: Never Used  Substance Use Topics  . Alcohol use: No  . Drug use: No    Review of Systems Constitutional: Negative for fever. ENT: Negative for recent illness/congestion Cardiovascular: Negative for chest pain. Respiratory: Negative for shortness of breath. Gastrointestinal: Negative for  abdominal pain, nausea or vomiting.  Positive for loose stool this morning. Genitourinary: Negative for urinary compaints Musculoskeletal: Negative for musculoskeletal complaints Skin: Negative for skin complaints  Neurological: Negative for headache All other ROS negative  ____________________________________________   PHYSICAL EXAM:  VITAL SIGNS: ED Triage Vitals  Enc Vitals Group     BP 10/09/18 1025 128/67     Pulse Rate 10/09/18 1025 61     Resp 10/09/18 1025 16     Temp 10/09/18 1025 (!) 97.5 F (36.4 C)     Temp Source 10/09/18 1025 Oral     SpO2 10/09/18 1025 94 %     Weight 10/09/18 1024 202 lb (91.6 kg)     Height 10/09/18 1024 5\' 4"  (1.626 m)     Head Circumference --      Peak Flow --      Pain Score 10/09/18 1024 0     Pain Loc --      Pain Edu? --      Excl. in [place]? --    Constitutional: Alert and oriented. Well appearing and in no distress. Eyes: Normal exam ENT   Head: Normocephalic and atraumatic   Mouth/Throat: Mucous membranes are moist. Cardiovascular: Normal rate, regular rhythm. Respiratory: Normal respiratory effort without tachypnea nor retractions. Breath sounds are clear  Gastrointestinal: Soft and nontender. No distention. Musculoskeletal: Nontender with normal range of motion in all extremities.  1-2+ lower extremity my, compression stockings in place.  Equal bilaterally. Neurologic:  Normal speech and language. No gross focal neurologic deficits  Skin:  Skin is warm, dry and intact.  Psychiatric: Mood and affect are normal.  ____________________________________________    EKG  EKG viewed and interpreted by myself shows atrial fibrillation at 115 bpm with a narrow QRS, low amplitude, no concerning ST changes.  ____________________________________________    RADIOLOGY  X-ray shows enlarged cardiac silhouette.  Interstitial edema.  ____________________________________________   INITIAL IMPRESSION / ASSESSMENT AND PLAN / ED  COURSE  Pertinent labs & imaging results that were available during my care of the patient were reviewed by me and considered in my medical decision making (see chart for details).  Patient presents to the emergency department for generalized weakness and a loose bowel movement this morning.  Overall the patient appears well, no distress, vitals are reassuring.  We will check labs, urinalysis, IV hydrate and continue to closely monitor.  X-ray shows an enlarged cardiac silhouette with interstitial  edema.  I performed a bedside ultrasound does appear to have somewhat of a decreased EF, however no significant pericardial effusion noted.  Given the new onset cardiomegaly with likely reduced EF in the setting of increased peripheral edema and interstitial edema on chest x-ray we will admit to the hospital service, we will dose 60 mg of IV Lasix.  Patient continues to appear well satting currently between 95 and 98% on room air.  Does subjectively continue to feel short of breath.    ____________________________________________   FINAL CLINICAL IMPRESSION(S) / ED DIAGNOSES  Weakness Pulmonary edema Cardiomegaly   Harvest Dark, MD 10/09/18 1534

## 2018-10-09 NOTE — ED Notes (Signed)
Patient transported to X-ray 

## 2018-10-09 NOTE — Progress Notes (Signed)
   [place] at [facility] Day: 0 days [name] is a 85 y.o. female with past medical history of anxiety/depression, neuropathy, peripheral vascular disease, hypertension, gout, hypothyroidism, CKD stage IV presenting with shortness of breath, weakness, lower extremity edema noted to be in congestive heart failure.  Patient's son was at bedside and discussed CODE STATUS with patient and the son at bedside.  Advance care planning discussed with patient  with additional Family at bedside. All questions in regards to overall condition and expected prognosis answered. The decision was made to continue current code status  CODE STATUS: dnr Time spent: 16 minutes

## 2018-10-09 NOTE — ED Triage Notes (Signed)
Pt comes from [facility] with generalized weakness and feeling unwell via EMS. Pt had a BM on herself which is outside of her normal. Pt has "fluid issues" and has generalized edema now.

## 2018-10-09 NOTE — Progress Notes (Signed)
LCSW met with patient who was being transferred to her room and with new floor nurse would be settling in. This worker will follow patient in the morning.  LCSW wishes her a speedy recovery and agreed to meet her in the morning.  [employer] LCSW [phone]

## 2018-10-09 NOTE — H&P (Addendum)
[facility] [place] NAME: [name]    MR#:  [id number]  DATE OF BIRTH:  [birth year]  DATE OF ADMISSION:  10/09/2018  PRIMARY CARE PHYSICIAN: Rosaland Lao, NP   REQUESTING/REFERRING PHYSICIAN: Dr. Harvest Dark  CHIEF COMPLAINT:   Chief Complaint  Patient presents with  . Weakness    HISTORY OF PRESENT ILLNESS:  [name]  is a 85 y.o. female with a known history of essential hypertension, anxiety, depression, chronic pain syndrome, Hypothyroidism, previous CVA who presents to the hospital from assisted living due to shortness of breath and worsening lower extremity edema.  Patient was in her usual state of health but over the past few months has developed progressive exertional dyspnea and also increasing lower extremity edema.  She does not have a previous history of congestive heart failure.  With minimal exertion she was getting significant shortness of breath and therefore sent to the ER for further evaluation.  Patient underwent chest x-ray in the ER which was suggestive of pulmonary edema and volume overload consistent with congestive heart failure.  Hospitalist services were contacted for admission.  Patient denies any palpitations, chest pains admits to some intermittent nausea but no vomiting.  Patient does admit to a chronic 2 pillow orthopnea but no paroxysmal nocturnal dyspnea.  She does admit to worsening lower extreme edema but does not know how much weight she has gained.  PAST MEDICAL HISTORY:   Past Medical History:  Diagnosis Date  . Abnormal glucose   . Anxiety   . Arthritis   . Asthma   . Calculus of gallbladder without mention of cholecystitis, with obstruction   . Cancer ([place])    kidney  . Candidiasis of the esophagus   . Cataract cortical, senile   . Chronic kidney disease, stage III (moderate) (HCC)   . Chronic pain   . Chronic pain syndrome   . Corns and callosities   . Debility   .  Depression   . Dermatophytosis of nail   . Diabetes mellitus without complication ([place])    unspecified, without mention of complication  . Difficult intubation   . Dysphagia   . Dyspnea    with exertion  . Edema, unspecified   . Emphysema lung ([place])   . Emphysema of lung ([place])   . Esophageal reflux   . Essential hypertension, benign   . Generalized atherosclerosis   . Glaucoma (increased eye pressure)   . Gout, unspecified   . Hemiplegia ([place])   . Hereditary and idiopathic peripheral neuropathy   . Hyperglycemia   . Hyperlipidemia   . Hypertension   . Hypoglycemia, unspecified   . Hypopotassemia   . Hypothyroidism   . Migraine, unspecified, without mention of intractable migraine without mention of status migrainosus   . Other vitamin B12 deficiency anemia   . Pain in limb   . Peripheral vascular disease ([place])   . Personal history of fall   . Stroke ([place])   . Thyroid disease   . Ulcer of foot ([place])   . Unspecified asthma(493.90)   . Unspecified constipation   . Unspecified hypertensive heart disease with heart failure(402.91)   . Unspecified urinary incontinence   . Unspecified vitamin D deficiency     PAST SURGICAL HISTORY:   Past Surgical History:  Procedure Laterality Date  . ABDOMINAL HYSTERECTOMY    . arterial thrombi stents in both legs    . BACK SURGERY    . BACK  SURGERY    . CATARACT EXTRACTION    . COLONOSCOPY    . ESOPHAGOGASTRODUODENOSCOPY (EGD) WITH PROPOFOL N/A 06/19/2016   Procedure: ESOPHAGOGASTRODUODENOSCOPY (EGD) WITH PROPOFOL;  Surgeon: Lollie Sails, MD;  Location: [facility] ENDOSCOPY;  Service: Endoscopy;  Laterality: N/A;  . ESOPHAGOGASTRODUODENOSCOPY (EGD) WITH PROPOFOL N/A 04/10/2017   Procedure: ESOPHAGOGASTRODUODENOSCOPY (EGD) WITH PROPOFOL;  Surgeon: Lollie Sails, MD;  Location: [facility] ENDOSCOPY;  Service: Endoscopy;  Laterality: N/A;  . EYE SURGERY     cataract extraction  . LOWER EXTREMITY ANGIOGRAPHY Right 10/07/2016   Procedure:  Lower Extremity Angiography;  Surgeon: Katha Cabal, MD;  Location: [place] CV LAB;  Service: Cardiovascular;  Laterality: Right;  . LOWER EXTREMITY ANGIOGRAPHY Right 02/23/2018   Procedure: LOWER EXTREMITY ANGIOGRAPHY;  Surgeon: Katha Cabal, MD;  Location: [place] CV LAB;  Service: Cardiovascular;  Laterality: Right;  . LOWER EXTREMITY ANGIOGRAPHY Left 05/18/2018   Procedure: LOWER EXTREMITY ANGIOGRAPHY;  Surgeon: Katha Cabal, MD;  Location: [place] [facility] LAB;  Service: Cardiovascular;  Laterality: Left;  . PERIPHERAL VASCULAR CATHETERIZATION Left 09/02/2016   Procedure: Lower Extremity Angiography;  Surgeon: Katha Cabal, MD;  Location: [place] CV LAB;  Service: Cardiovascular;  Laterality: Left;  . PERIPHERAL VASCULAR CATHETERIZATION Right 09/16/2016   Procedure: Lower Extremity Angiography;  Surgeon: Katha Cabal, MD;  Location: [place] [facility] LAB;  Service: Cardiovascular;  Laterality: Right;    SOCIAL HISTORY:   Social History   Tobacco Use  . Smoking status: Never Smoker  . Smokeless tobacco: Never Used  Substance Use Topics  . Alcohol use: No    FAMILY HISTORY:   Family History  Problem Relation Age of Onset  . Cancer Father   . Stroke Father   . Seizures Mother   . Lung cancer Sister   . Bone cancer Sister     DRUG ALLERGIES:  No Known Allergies  REVIEW OF SYSTEMS:   Review of Systems  Constitutional: Negative for fever and weight loss.  HENT: Negative for congestion, nosebleeds and tinnitus.   Eyes: Negative for blurred vision, double vision and redness.  Respiratory: Positive for shortness of breath. Negative for cough and hemoptysis.   Cardiovascular: Positive for leg swelling. Negative for chest pain, orthopnea and PND.  Gastrointestinal: Negative for abdominal pain, diarrhea, melena, nausea and vomiting.  Genitourinary: Negative for dysuria, hematuria and urgency.  Musculoskeletal: Negative for falls and joint  pain.  Neurological: Negative for dizziness, tingling, sensory change, focal weakness, seizures, weakness and headaches.  Endo/Heme/Allergies: Negative for polydipsia. Does not bruise/bleed easily.  Psychiatric/Behavioral: Negative for depression and memory loss. The patient is not nervous/anxious.     MEDICATIONS AT HOME:   Prior to Admission medications   Medication Sig Start Date End Date Taking? Authorizing Provider  acetaminophen (TYLENOL) 325 MG tablet Take 650 mg by mouth every 4 (four) hours as needed for mild pain.    Yes [provider]  allopurinol (ZYLOPRIM) 100 MG tablet Take 100 mg by mouth daily.   Yes [provider]  amitriptyline (ELAVIL) 25 MG tablet Take 12.5 mg by mouth 2 (two) times daily.    Yes [provider]  aspirin 81 MG chewable tablet Chew 81 mg by mouth daily.   Yes [provider]  atorvastatin (LIPITOR) 20 MG tablet Take 20 mg by mouth daily.   Yes [provider]  Cholecalciferol (VITAMIN D-3) 1000 UNITS CAPS Take 2,000 Units by mouth daily.    Yes [provider]  clopidogrel (PLAVIX) 75 MG tablet Take 1 tablet (75 mg total) by mouth daily. 02/24/18 02/24/19 Yes Schnier, Dolores Lory, MD  DULoxetine (CYMBALTA) 30 MG capsule Take 30 mg by mouth every morning.   Yes [provider]  furosemide (LASIX) 40 MG tablet Take 40 mg by mouth daily.   Yes [provider]  gabapentin (NEURONTIN) 400 MG capsule Take 400 mg by mouth 2 (two) times daily.  11/28/17  Yes [provider]  gabapentin (NEURONTIN) 600 MG tablet Take 900 mg by mouth at bedtime.  07/11/17  Yes [provider]  latanoprost (XALATAN) 0.005 % ophthalmic solution Place 1 drop into both eyes at bedtime.   Yes [provider]  levothyroxine (SYNTHROID, LEVOTHROID) 25 MCG tablet Take 25 mcg by mouth daily before breakfast.   Yes [provider]  metoprolol tartrate (LOPRESSOR) 25 MG tablet Take 25 mg by  mouth 2 (two) times daily.    Yes [provider]  ondansetron (ZOFRAN) 4 MG tablet Take 4 mg by mouth every 8 (eight) hours as needed for nausea or vomiting.   Yes [provider]  pantoprazole (PROTONIX) 40 MG tablet Take 40 mg by mouth daily.    Yes [provider]  polyethylene glycol (MIRALAX / GLYCOLAX) packet Take 17 g by mouth daily as needed for mild constipation or moderate constipation.   Yes [provider]  potassium chloride (K-DUR,KLOR-CON) 10 MEQ tablet Take 10 mEq by mouth daily.   Yes [provider]  senna (SENOKOT) 8.6 MG tablet Take 2 tablets by mouth 2 (two) times daily.    Yes [provider]  topiramate (TOPAMAX) 25 MG tablet Take 25 mg by mouth at bedtime.   Yes [provider]  traMADol (ULTRAM) 50 MG tablet Take 50 mg by mouth every 8 (eight) hours as needed (for pain control).    Yes [provider]  traMADol (ULTRAM) 50 MG tablet Take 50 mg by mouth 2 (two) times daily.   Yes [provider]  aspirin EC 81 MG EC tablet Take 1 tablet (81 mg total) by mouth daily. 07/22/16   Fritzi Mandes, MD  atorvastatin (LIPITOR) 10 MG tablet TAKE 1 TABLET BY MOUTH ONCE DAILY **ADDITIONAL REFILLS PER THE PRIMARY CARE PROVIDER** 06/23/18   Kris Hartmann, NP  furosemide (LASIX) 20 MG tablet Take 1 tablet (20 mg total) by mouth every other day. 07/22/16   Fritzi Mandes, MD      VITAL SIGNS:  Blood pressure (!) 142/80, pulse (!) 107, temperature (!) 97.5 F (36.4 C), temperature source Oral, resp. rate 20, height 5\' 4"  (1.626 m), weight 91.6 kg, SpO2 98 %.  PHYSICAL EXAMINATION:  Physical Exam  GENERAL:  85 y.o.-year-old patient lying in the bed in no acute distress.  EYES: Pupils equal, round, reactive to light and accommodation. No scleral icterus. Extraocular muscles intact.  HEENT: Head atraumatic, normocephalic. Oropharynx and nasopharynx clear. No oropharyngeal erythema, moist oral mucosa  NECK:   Supple, no jugular venous distention. No thyroid enlargement, no tenderness.  LUNGS: Normal breath sounds bilaterally, no wheezing, bibasilar rales, No rhonchi. No use of accessory muscles of respiration.  CARDIOVASCULAR: S1, S2 RRR. No murmurs, rubs, gallops, clicks.  ABDOMEN: Soft, nontender, nondistended. Bowel sounds present. No organomegaly or mass.  EXTREMITIES: +1-2 edema b/l, cyanosis, or clubbing. + 2 pedal & radial pulses b/l.   NEUROLOGIC: Cranial nerves II through XII are intact. No focal Motor or sensory deficits appreciated b/l. Globally weak.  PSYCHIATRIC:  The patient is alert and oriented x 3.  SKIN: No obvious rash, lesion, or ulcer.   LABORATORY PANEL:   CBC Recent Labs  Lab 10/09/18 1035  WBC 6.4  HGB 9.6*  HCT 31.2*  PLT 198   ------------------------------------------------------------------------------------------------------------------  Chemistries  Recent Labs  Lab 10/09/18 1115  NA 137  K 4.3  CL 102  CO2 25  GLUCOSE 88  BUN 20  CREATININE 1.47*  CALCIUM 8.5*  AST 25  ALT 7  ALKPHOS 136*  BILITOT 0.8   ------------------------------------------------------------------------------------------------------------------  Cardiac Enzymes Recent Labs  Lab 10/09/18 1115  TROPONINI <0.03   ------------------------------------------------------------------------------------------------------------------  RADIOLOGY:  Dg Chest 2 View  Result Date: 10/09/2018 CLINICAL DATA:  Shortness of breath and wheezing for a couple of weeks. EXAM: CHEST - 2 VIEW COMPARISON:  12/09/2017 FINDINGS: Enlarged cardiac silhouette. Calcific atherosclerotic disease and tortuosity of the aorta. Coronary artery calcifications. There is no evidence of pneumothorax. Increased interstitial markings. Possible right pleural effusion. Right lower lobe atelectasis versus airspace consolidation. Osseous structures are without acute abnormality. Soft tissues are grossly normal.  IMPRESSION: 1. Enlarged cardiac silhouette represents a change from patient's prior chest x-ray dated 12/09/2017. This may represent cardiomegaly/cardiomyopathy versus pericardial effusion. 2. Interstitial pulmonary edema. 3. Possible right pleural effusion. 4. Right lower lobe atelectasis versus airspace consolidation. Electronically Signed   By: Fidela Salisbury M.D.   On: 10/09/2018 11:11     IMPRESSION AND PLAN:   85 year old female with past medical history of hypertension, anxiety, depression, CKD stage IV, peripheral vascular disease, hypothyroidism, neuropathy, history of gout who presents to the hospital due to shortness of breath and worsening lower extremity edema.  1.  Acute CHF-this is a cause of patient's worsening shortness of breath and lower extremity edema. -Patient has no previous history of CHF but is on Lasix at the assisted living.  For now I will diurese her with IV Lasix, follow I's and O's and daily weights. - Continue metoprolol, will add some low-dose ACE inhibitor.  Will check an echocardiogram.  I will also get a cardiology consult [facility] Message to Dr. Celso Amy).  2.  Essential hypertension-continue metoprolol.    3.  Hypothyroidism-continue Synthroid.  4.  History of glaucoma-continue latanoprost eyedrops.    5.  History of gout-no acute attack.  Continue allopurinol.  6.  Neuropathy-continue gabapentin.  7.  GERD-continue Protonix.  All the records are reviewed and case discussed with ED provider. Management plans discussed with the patient, family and they are in agreement.  CODE STATUS: DNR  TOTAL TIME TAKING CARE OF THIS PATIENT: 45 minutes.    Henreitta Leber M.D on 10/09/2018 at 3:59 PM  Between 7am to 6pm - Pager - [phone]  After 6pm go to [email or web address] - password [email or web address] [facility]  Office  [phone]  CC: Primary care physician; Rosaland Lao, NP

## 2018-10-10 ENCOUNTER — Inpatient Hospital Stay
Admit: 2018-10-10 | Discharge: 2018-10-10 | Disposition: A | Discharge status: Home / Self Care | Payer: Medicare Other | Attending: Specialist | Admitting: Specialist

## 2018-10-10 DIAGNOSIS — L8990 Pressure ulcer of unspecified site, unspecified stage: Secondary | ICD-10-CM

## 2018-10-10 LAB — BASIC METABOLIC PANEL
Anion gap: 8 (ref 5–15)
BUN: 20 mg/dL (ref 8–23)
CO2: 24 mmol/L (ref 22–32)
Calcium: 8.2 mg/dL — ABNORMAL LOW (ref 8.9–10.3)
Chloride: 107 mmol/L (ref 98–111)
Creatinine, Ser: 1.4 mg/dL — ABNORMAL HIGH (ref 0.44–1.00)
GFR calc Af Amer: 39 mL/min — ABNORMAL LOW (ref >60)
GFR calc non Af Amer: 33 mL/min — ABNORMAL LOW (ref >60)
Glucose, Bld: 72 mg/dL (ref 70–99)
Potassium: 3.5 mmol/L (ref 3.5–5.1)
Sodium: 139 mmol/L (ref 135–145)

## 2018-10-10 LAB — TROPONIN I: Troponin I: <0.03 ng/mL (ref <0.03)

## 2018-10-10 LAB — ECHOCARDIOGRAM COMPLETE
Height: 65 in
Weight: 3588.8 oz

## 2018-10-10 MED ORDER — LOPERAMIDE HCL 2 MG PO CAPS
2.0000 mg | ORAL_CAPSULE | Freq: Once | ORAL | Status: AC
  Administered 2018-10-10: 2 mg via ORAL
  Filled 2018-10-10: qty 1

## 2018-10-10 MED ORDER — POTASSIUM CHLORIDE CRYS ER 20 MEQ PO TBCR
40.0000 meq | EXTENDED_RELEASE_TABLET | Freq: Once | ORAL | Status: AC
  Administered 2018-10-10: 40 meq via ORAL
  Filled 2018-10-10: qty 2

## 2018-10-10 NOTE — Progress Notes (Signed)
Patient is Alert and oriented

## 2018-10-10 NOTE — Progress Notes (Signed)
[facility] [place] NAME: [name]    MR#:  [id number]  DATE OF BIRTH:  11/28/1927  SUBJECTIVE:  CHIEF COMPLAINT:   Chief Complaint  Patient presents with  . Weakness  Patient seen and evaluated today Swelling in the lower extremities Shortness of breath better Weaned off oxygen  REVIEW OF SYSTEMS:    ROS  CONSTITUTIONAL: No documented fever. No fatigue, weakness. No weight gain, no weight loss.  EYES: No blurry or double vision.  ENT: No tinnitus. No postnasal drip. No redness of the oropharynx.  RESPIRATORY: No cough, no wheeze, no hemoptysis.  Has dyspnea.  CARDIOVASCULAR: No chest pain.  Has orthopnea. No palpitations. No syncope.  GASTROINTESTINAL: No nausea, no vomiting or diarrhea. No abdominal pain. No melena or hematochezia.  GENITOURINARY: No dysuria or hematuria.  ENDOCRINE: No polyuria or nocturia. No heat or cold intolerance.  HEMATOLOGY: No anemia. No bruising. No bleeding.  INTEGUMENTARY: No rashes. No lesions.  MUSCULOSKELETAL: No arthritis. Has swelling in lower extremities. No gout.  NEUROLOGIC: No numbness, tingling, or ataxia. No seizure-type activity.  PSYCHIATRIC: No anxiety. No insomnia. No ADD.   DRUG ALLERGIES:  No Known Allergies  VITALS:  Blood pressure (!) 112/59, pulse 83, temperature 98.6 F (37 C), temperature source Oral, resp. rate 19, height 5\' 5"  (1.651 m), weight 101.7 kg, SpO2 95 %.  PHYSICAL EXAMINATION:   Physical Exam  GENERAL:  85 y.o.-year-old patient lying in the bed with no acute distress.  EYES: Pupils equal, round, reactive to light and accommodation. No scleral icterus. Extraocular muscles intact.  HEENT: Head atraumatic, normocephalic. Oropharynx and nasopharynx clear.  NECK:  Supple, no jugular venous distention. No thyroid enlargement, no tenderness.  LUNGS: Decreased breath sounds bilaterally, bibasilar crepitations heard. No use of accessory muscles of respiration.   CARDIOVASCULAR: S1, S2 normal. No murmurs, rubs, or gallops.  ABDOMEN: Soft, nontender, nondistended. Bowel sounds present. No organomegaly or mass.  EXTREMITIES: No cyanosis, clubbing  has edema b/l.    NEUROLOGIC: Cranial nerves II through XII are intact. No focal Motor or sensory deficits b/l.   PSYCHIATRIC: The patient is alert and oriented x 3.  SKIN: No obvious rash, lesion, or ulcer.   LABORATORY PANEL:   CBC Recent Labs  Lab 10/09/18 1035  WBC 6.4  HGB 9.6*  HCT 31.2*  PLT 198   ------------------------------------------------------------------------------------------------------------------ Chemistries  Recent Labs  Lab 10/09/18 1115 10/10/18 0504  NA 137 139  K 4.3 3.5  CL 102 107  CO2 25 24  GLUCOSE 88 72  BUN 20 20  CREATININE 1.47* 1.40*  CALCIUM 8.5* 8.2*  AST 25  --   ALT 7  --   ALKPHOS 136*  --   BILITOT 0.8  --    ------------------------------------------------------------------------------------------------------------------  Cardiac Enzymes Recent Labs  Lab 10/10/18 0504  TROPONINI <0.03   ------------------------------------------------------------------------------------------------------------------  RADIOLOGY:  Dg Chest 2 View  Result Date: 10/09/2018 CLINICAL DATA:  Shortness of breath and wheezing for a couple of weeks. EXAM: CHEST - 2 VIEW COMPARISON:  12/09/2017 FINDINGS: Enlarged cardiac silhouette. Calcific atherosclerotic disease and tortuosity of the aorta. Coronary artery calcifications. There is no evidence of pneumothorax. Increased interstitial markings. Possible right pleural effusion. Right lower lobe atelectasis versus airspace consolidation. Osseous structures are without acute abnormality. Soft tissues are grossly normal. IMPRESSION: 1. Enlarged cardiac silhouette represents a change from patient's prior chest x-ray dated 12/09/2017. This may represent cardiomegaly/cardiomyopathy versus pericardial effusion. 2.  Interstitial pulmonary edema. 3.  Possible right pleural effusion. 4. Right lower lobe atelectasis versus airspace consolidation. Electronically Signed   By: Fidela Salisbury M.D.   On: 10/09/2018 11:11     ASSESSMENT AND PLAN:   85 year old female patient with history of hypertension, anxiety disorder, chronic pain, hypothyroidism, CVA CKD stage III, congestive heart failure presented to the hospital for shortness of breath and lower extremity edema  -Decompensated heart failure Diurese patient with IV Lasix Input output chart and daily body weights Continue ACE inhibitor and metoprolol Check echocardiogram Cardiology evaluation  -Glaucoma Continue latanoprost eyedrops  -Hypothyroidism Continue oral Synthroid  -History of gout Continue allopurinol  -Hypertension Continue beta-blocker  All the records are reviewed and case discussed with Care Management/Social Worker. Management plans discussed with the patient, family and they are in agreement.  CODE STATUS: DNR  DVT Prophylaxis: SCDs  TOTAL TIME TAKING CARE OF THIS PATIENT: 35 minutes.   POSSIBLE D/C IN 2 to 3 DAYS, DEPENDING ON CLINICAL CONDITION.  Saundra Shelling M.D on 10/10/2018 at 11:51 AM  Between 7am to 6pm - Pager - [phone]  After 6pm go to [email or web address] - password [email or web address] [facility]  Office  [phone]  CC: Primary care physician; Rosaland Lao, NP  Note: This dictation was prepared with Dragon dictation along with smaller phrase technology. Any transcriptional errors that result from this process are unintentional.

## 2018-10-10 NOTE — Evaluation (Signed)
Physical Therapy Evaluation Patient Details Name: [name] MRN: [id number] DOB: [birth year] Today's Date: 10/10/2018   History of Present Illness  85 yo female was admitted to [facility] with CHF exacerbation, from ALF at [facility].  Per reports pt has pulm edema, RLL atelectasis, pleural effusion and is weak with O2 required initially.  PMHx:  LE edema, glaucoma, gout, HTN, hypothyroidism, CHF  Clinical Impression  Pt was seen for bed mob and attempted [facility] transfer.  Pt is quite weak, and was able to get to sit on side of bed but requires max assist to do partial standing.  Will work acutely toward better control of standing balance, and progress her gait as she is able.  Pt previously was mod I on rollator in the ALF, so will anticipate a great deal of ground to still cover.    Follow Up Recommendations SNF    Equipment Recommendations  None recommended by PT    Recommendations for Other Services       Precautions / Restrictions Precautions Precautions: Fall Precaution Comments: walked to meals on rollator previously Restrictions Weight Bearing Restrictions: No      Mobility  Bed Mobility Overal bed mobility: Needs Assistance Bed Mobility: Supine to Sit;Sit to Supine;Rolling Rolling: Max assist;+2 for physical assistance   Supine to sit: Max assist;HOB elevated Sit to supine: Max assist;+2 for physical assistance;+2 for safety/equipment   General bed mobility comments: pt demonstrates poor control of rolling and sidelying but is weak and stiff in trunk and hips  Transfers Overall transfer level: Needs assistance Equipment used: Rolling walker (2 wheeled);1 person hand held assist Transfers: Sit to/from Stand Sit to Stand: Max assist;From elevated surface         General transfer comment: partial standing with AD being unhelpful for initial efforts  Ambulation/Gait                Stairs            Wheelchair Mobility    Modified Rankin (Stroke  Patients Only)       Balance Overall balance assessment: Needs assistance Sitting-balance support: Feet supported Sitting balance-Leahy Scale: Fair     Standing balance support: During functional activity;Bilateral upper extremity supported Standing balance-Leahy Scale: Poor                               Pertinent Vitals/Pain Pain Assessment: No/denies pain    Home Living Family/patient expects to be discharged to:: Assisted living               Home Equipment: Walker - 4 wheels;Cane - quad;Bedside commode;Shower seat - built in;Grab bars - toilet;Grab bars - tub/shower Additional Comments: fully accessible environment    Prior Function Level of Independence: Needs assistance   Gait / Transfers Assistance Needed: rollator on the hall at home  ADL's / Homemaking Assistance Needed: ALF staff for cooking, cleaning and meds        Hand Dominance   Dominant Hand: Right    Extremity/Trunk Assessment   Upper Extremity Assessment Upper Extremity Assessment: Overall WFL for tasks assessed    Lower Extremity Assessment Lower Extremity Assessment: Generalized weakness    Cervical / Trunk Assessment Cervical / Trunk Assessment: Other exceptions;Kyphotic(stiffness and core weakness)  Communication   Communication: No difficulties  Cognition Arousal/Alertness: Awake/alert Behavior During Therapy: WFL for tasks assessed/performed Overall Cognitive Status: Within Functional Limits for tasks assessed  General Comments      Exercises     Assessment/Plan    PT Assessment Patient needs continued PT services  PT Problem List Decreased strength;Decreased range of motion;Decreased activity tolerance;Decreased balance;Decreased mobility;Decreased coordination;Decreased knowledge of use of DME;Decreased safety awareness;Cardiopulmonary status limiting activity;Obesity       PT Treatment Interventions  DME instruction;Gait training;Functional mobility training;Therapeutic activities;Therapeutic exercise;Balance training;Neuromuscular re-education;Patient/family education    PT Goals (Current goals can be found in the Care Plan section)  Acute Rehab PT Goals Patient Stated Goal: to get stronger and use BSC PT Goal Formulation: With patient Time For Goal Achievement: 10/24/18 Potential to Achieve Goals: Good    Frequency Min 2X/week   Barriers to discharge Decreased caregiver support ALF is staffed lower than SNF    Co-evaluation               AM-PAC PT "6 Clicks" Mobility  Outcome Measure Help needed turning from your back to your side while in a flat bed without using bedrails?: Total Help needed moving from lying on your back to sitting on the side of a flat bed without using bedrails?: Total Help needed moving to and from a bed to a chair (including a wheelchair)?: Total Help needed standing up from a chair using your arms (e.g., wheelchair or bedside chair)?: Total Help needed to walk in hospital room?: Total Help needed climbing 3-5 steps with a railing? : Total 6 Click Score: 6    End of Session Equipment Utilized During Treatment: Gait belt Activity Tolerance: Patient tolerated treatment well;Patient limited by fatigue Patient left: in bed;with call bell/phone within reach;with bed alarm set Nurse Communication: Mobility status PT Visit Diagnosis: Unsteadiness on feet (R26.81);Muscle weakness (generalized) (M62.81);Other abnormalities of gait and mobility (R26.89);Difficulty in walking, not elsewhere classified (R26.2);Adult, failure to thrive (R62.7)    Time: 5409-8119 PT Time Calculation (min) (ACUTE ONLY): 44 min   Charges:   PT Evaluation $PT Eval Moderate Complexity: 1 Mod PT Treatments $Therapeutic Activity: 23-37 mins       Ramond Dial 10/10/2018, 9:39 PM  Mee Hives, PT MS Acute Rehab Dept. Number: [place] and [place]

## 2018-10-10 NOTE — Clinical Social Work Note (Signed)
Clinical Social Work Assessment  Patient Details  Name: [name] MRN: [id number] Date of Birth: [birth year]  Date of referral:  10/10/18               Reason for consult:  Other (Comment Required)(From [facility])                Permission sought to share information with:  Family Supports Permission granted to share information::  Yes, Verbal Permission Granted  Name::     Ashok Pall [facility] [phone]   Agency::  [facility]  Relationship::     Contact Information:     Housing/Transportation Living arrangements for the past 2 months:  [place] of Information:  Patient Patient Interpreter Needed:  None Criminal Activity/Legal Involvement Pertinent to Current Situation/Hospitalization:  No - Comment as needed Significant Relationships:  Adult Children, [place], Other Family Members Lives with:  Facility Resident Do you feel safe going back to the place where you live?  Yes Need for family participation in patient care:  Yes (Comment)  Care giving concerns:    Social Worker assessment / plan: LCSW introduced myself to patient and received verbal consent to speak to both family and facility. Patient is 85 year old widowed female who lives at [facility]F for the last five years. She needs some support for her ADLs and uses a walker to ambulate. She presented to ED because she had leg swelling and shortness of breath. It is her plan to return to [facility] once DC. She reports she is well taken care of by her family and has no immediate needs. Patient son [name] is her HCPOA and her insurance is [place] [employer] and medicaid.  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  Patient/Family's Understanding of and Emotional Response to Diagnosis, Current Treatment, and Prognosis:  Patient has a good understanding of her medical issues  Emotional Assessment Appearance:  Appears stated age Attitude/Demeanor/Rapport:  Charismatic, Gracious Affect (typically observed):  Accepting, Adaptable Orientation:  Oriented to Self, Oriented to Place, Oriented to  Time, Oriented to Situation Alcohol / Substance use:  Not Applicable Psych involvement (Current and /or in the community):  No (Comment)  Discharge Needs  Concerns to be addressed:  No discharge needs identified Readmission within the last 30 days:  No Current discharge risk:  None Barriers to Discharge:  No Barriers Identified   Joana Reamer, LCSW 10/10/2018, 8:49 AM

## 2018-10-10 NOTE — Progress Notes (Signed)
*  PRELIMINARY RESULTS* Echocardiogram 2D Echocardiogram has been performed.  [name] 10/10/2018, 12:29 PM

## 2018-10-11 LAB — BASIC METABOLIC PANEL
Anion gap: 10 (ref 5–15)
BUN: 20 mg/dL (ref 8–23)
CO2: 24 mmol/L (ref 22–32)
Calcium: 8.6 mg/dL — ABNORMAL LOW (ref 8.9–10.3)
Chloride: 107 mmol/L (ref 98–111)
Creatinine, Ser: 1.56 mg/dL — ABNORMAL HIGH (ref 0.44–1.00)
GFR, EST AFRICAN AMERICAN: 34 mL/min — AB (ref >60)
GFR, EST NON AFRICAN AMERICAN: 29 mL/min — AB (ref >60)
Glucose, Bld: 74 mg/dL (ref 70–99)
POTASSIUM: 3.7 mmol/L (ref 3.5–5.1)
Sodium: 141 mmol/L (ref 135–145)

## 2018-10-11 MED ORDER — ENOXAPARIN SODIUM 30 MG/0.3ML ~~LOC~~ SOLN
30.0000 mg | SUBCUTANEOUS | Status: DC
  Administered 2018-10-11 – 2018-10-12 (×2): 30 mg via SUBCUTANEOUS
  Filled 2018-10-11 (×2): qty 0.3

## 2018-10-11 MED ORDER — ADULT MULTIVITAMIN W/MINERALS CH
1.0000 | ORAL_TABLET | Freq: Every day | ORAL | Status: DC
  Administered 2018-10-12 – 2018-10-13 (×2): 1 via ORAL
  Filled 2018-10-11 (×2): qty 1

## 2018-10-11 MED ORDER — ENSURE ENLIVE PO LIQD
237.0000 mL | Freq: Two times a day (BID) | ORAL | Status: DC
  Administered 2018-10-11 – 2018-10-13 (×4): 237 mL via ORAL

## 2018-10-11 MED ORDER — TRAMADOL HCL 50 MG PO TABS
50.0000 mg | ORAL_TABLET | Freq: Three times a day (TID) | ORAL | Status: DC | PRN
  Administered 2018-10-11 – 2018-10-13 (×2): 50 mg via ORAL
  Filled 2018-10-11 (×2): qty 1

## 2018-10-11 MED ORDER — FUROSEMIDE 40 MG PO TABS
40.0000 mg | ORAL_TABLET | Freq: Every day | ORAL | Status: DC
  Administered 2018-10-11 – 2018-10-13 (×3): 40 mg via ORAL
  Filled 2018-10-11 (×3): qty 1

## 2018-10-11 NOTE — NC FL2 (Signed)
[place] LEVEL OF CARE SCREENING TOOL     IDENTIFICATION  Patient Name: [name] Birthdate: [birth year] Sex: female Admission Date (Current Location): 10/09/2018  [name] and [name] Number:  [name] [id number] [place] and Address:  [facility], [address], [place], [place] [address]      Provider Number: [id number]  Attending Physician Name and Address:  Saundra Shelling, MD  Relative Name and Phone Number:  [name] [phone]  [phone]     Current Level of Care: Hospital Recommended Level of Care: [place] Prior Approval Number:    Date Approved/Denied:   PASRR Number: [id number]  Discharge Plan: SNF    Current Diagnoses: Patient Active Problem List   Diagnosis Date Noted  . Pressure injury of skin 10/10/2018  . CHF (congestive heart failure) ([place]) 10/09/2018  . Cancer of kidney ([place]) 02/10/2018  . Cataract cortical, senile 02/10/2018  . Emphysema lung ([place]) 02/10/2018  . Glaucoma (increased eye pressure) 02/10/2018  . Ulcer 02/10/2018  . GIB (gastrointestinal bleeding) 12/09/2017  . Chronic venous insufficiency 09/02/2017  . Lymphedema 09/02/2017  . Varicose veins of both lower extremities with pain 07/29/2017  . Hyperlipidemia 05/04/2017  . Hypothyroidism 05/04/2017  . Acute CVA (cerebrovascular accident) ([place]) 05/04/2017  . Gout 10/27/2016  . CVA (cerebral vascular accident) ([place]) 07/20/2016  . Essential hypertension 07/14/2016  . Atherosclerosis of native arteries of the extremities with ulceration ([place]) 07/14/2016  . PVD (peripheral vascular disease) ([place]) 07/14/2016    Orientation RESPIRATION BLADDER Height & Weight     Self, Time, Situation, Place  Normal Incontinent Weight: 227 lb 11.8 oz (103.3 kg) Height:  5\' 5"  (165.1 cm)  BEHAVIORAL SYMPTOMS/MOOD NEUROLOGICAL BOWEL NUTRITION STATUS      Incontinent Diet(Cardiac diet)  AMBULATORY STATUS COMMUNICATION OF NEEDS  Skin   Limited Assist Verbally PU Stage and Appropriate Care   PU Stage 2 Dressing: (PRN dressing changes)                   Personal Care Assistance Level of Assistance  Feeding, Dressing, Bathing Bathing Assistance: Limited assistance Feeding assistance: Independent Dressing Assistance: Limited assistance     Functional Limitations Info  Sight, Hearing, Speech Sight Info: Adequate Hearing Info: Adequate Speech Info: Adequate    SPECIAL CARE FACTORS FREQUENCY  PT (By licensed PT), OT (By licensed OT)     PT Frequency: 5x a week OT Frequency: 5x a week            Contractures Contractures Info: Not present    Additional Factors Info  Code Status, Allergies Code Status Info: DNR Allergies Info: No Known Allergies            Current Medications (10/11/2018):  This is the current hospital active medication list Current Facility-Administered Medications  Medication Dose Route Frequency Provider Last Rate Last Dose  . 0.9 %  sodium chloride infusion  250 mL Intravenous PRN Henreitta Leber, MD      . acetaminophen (TYLENOL) tablet 650 mg  650 mg Oral Q4H PRN Henreitta Leber, MD      . allopurinol (ZYLOPRIM) tablet 100 mg  100 mg Oral Daily Henreitta Leber, MD   100 mg at 10/11/18 0935  . amitriptyline (ELAVIL) tablet 12.5 mg  12.5 mg Oral BID Henreitta Leber, MD   12.5 mg at 10/11/18 0934  . aspirin chewable tablet 81 mg  81 mg Oral Daily Sainani, Belia Heman, MD   81 mg at  10/11/18 0934  . atorvastatin (LIPITOR) tablet 20 mg  20 mg Oral Daily Henreitta Leber, MD   20 mg at 10/11/18 0934  . cholecalciferol (VITAMIN D) tablet 2,000 Units  2,000 Units Oral Daily Henreitta Leber, MD   2,000 Units at 10/11/18 0935  . clopidogrel (PLAVIX) tablet 75 mg  75 mg Oral Daily Henreitta Leber, MD   75 mg at 10/11/18 0934  . DULoxetine (CYMBALTA) DR capsule 30 mg  30 mg Oral Daily Henreitta Leber, MD   30 mg at 10/11/18 0934  . enoxaparin (LOVENOX) injection 30 mg  30 mg  Subcutaneous Q24H Oswald Hillock, RPH   30 mg at 10/11/18 1642  . feeding supplement (ENSURE ENLIVE) (ENSURE ENLIVE) liquid 237 mL  237 mL Oral BID BM Pyreddy, Pavan, MD   237 mL at 10/11/18 1646  . furosemide (LASIX) tablet 40 mg  40 mg Oral Daily Pyreddy, Reatha Harps, MD   40 mg at 10/11/18 1215  . gabapentin (NEURONTIN) capsule 400 mg  400 mg Oral BID Henreitta Leber, MD   400 mg at 10/11/18 1402  . gabapentin (NEURONTIN) capsule 900 mg  900 mg Oral QHS Henreitta Leber, MD   900 mg at 10/10/18 2141  . latanoprost (XALATAN) 0.005 % ophthalmic solution 1 drop  1 drop Both Eyes QHS Henreitta Leber, MD   1 drop at 10/10/18 2210  . levothyroxine (SYNTHROID, LEVOTHROID) tablet 25 mcg  25 mcg Oral QAC breakfast Henreitta Leber, MD   25 mcg at 10/11/18 [phone]  . lisinopril (PRINIVIL,ZESTRIL) tablet 5 mg  5 mg Oral Daily Henreitta Leber, MD   5 mg at 10/11/18 0935  . metoprolol tartrate (LOPRESSOR) tablet 25 mg  25 mg Oral BID Henreitta Leber, MD   25 mg at 10/11/18 0935  . [START ON 10/12/2018] multivitamin with minerals tablet 1 tablet  1 tablet Oral Daily Pyreddy, Pavan, MD      . ondansetron (ZOFRAN) injection 4 mg  4 mg Intravenous Q6H PRN Sainani, Belia Heman, MD      . pantoprazole (PROTONIX) EC tablet 40 mg  40 mg Oral Daily Henreitta Leber, MD   40 mg at 10/11/18 0934  . polyethylene glycol (MIRALAX / GLYCOLAX) packet 17 g  17 g Oral Daily PRN Henreitta Leber, MD      . potassium chloride (K-DUR,KLOR-CON) CR tablet 10 mEq  10 mEq Oral Daily Henreitta Leber, MD   10 mEq at 10/11/18 0934  . senna (SENOKOT) tablet 17.2 mg  2 tablet Oral BID Henreitta Leber, MD   17.2 mg at 10/10/18 2142  . sodium chloride flush (NS) 0.9 % injection 3 mL  3 mL Intravenous Q12H Henreitta Leber, MD   3 mL at 10/11/18 0941  . sodium chloride flush (NS) 0.9 % injection 3 mL  3 mL Intravenous PRN Henreitta Leber, MD      . topiramate (TOPAMAX) tablet 25 mg  25 mg Oral QHS Henreitta Leber, MD   25 mg at 10/10/18 2142  .  traMADol (ULTRAM) tablet 50 mg  50 mg Oral Q8H PRN Saundra Shelling, MD   50 mg at 10/11/18 6712     Discharge Medications: Please see discharge summary for a list of discharge medications.  Relevant Imaging Results:  Relevant Lab Results:   Additional Information SS# [id number]  [name], LCSW

## 2018-10-11 NOTE — Progress Notes (Signed)
Initial Nutrition Assessment  DOCUMENTATION CODES:   Not applicable  INTERVENTION:   Ensure Enlive po BID, each supplement provides 350 kcal and 20 grams of protein  MVI daily   Liberalize diet   NUTRITION DIAGNOSIS:   Inadequate oral intake related to acute illness as evidenced by meal completion < 50%.  GOAL:   Patient will meet greater than or equal to 90% of their needs  MONITOR:   PO intake, Supplement acceptance, Labs, Weight trends, Skin, I & O's  REASON FOR ASSESSMENT:   Malnutrition Screening Tool    ASSESSMENT:   85 year old female patient with history of hypertension, anxiety disorder, chronic pain, hypothyroidism, CVA CKD stage III, congestive heart failure presented to the hospital for shortness of breath and lower extremity edema   Met with pt in room today. Pt is a poor historian but reports poor appetite and oral intake pta. Pt reports her appetite has remained poor today. Pt ate 35% of her lunch. Pt reports that she does enjoy Ensure but does not drink this at home. Per chart, pt with weight gain pta; pt is ~15kg up from her UBW. RD will add supplements and MVI to support wound healing. RD will liberalize the heart healthy portion of pt's diet as this is restricting.   Medications reviewed and include: allopurinol, aspirin, vitamin D, plavix, lovenox, synthroid, protonix, KCl  Labs reviewed: creat 1.56(H) Hgb 9.6(L), Hct 31.2(L)  NUTRITION - FOCUSED PHYSICAL EXAM:    Most Recent Value  Orbital Region  Mild depletion  Upper Arm Region  No depletion  Thoracic and Lumbar Region  No depletion  Buccal Region  No depletion  Temple Region  Mild depletion  Clavicle Bone Region  No depletion  Clavicle and Acromion Bone Region  No depletion  Scapular Bone Region  No depletion  Dorsal Hand  No depletion  Patellar Region  Unable to assess  Anterior Thigh Region  Unable to assess  Posterior Calf Region  Unable to assess  Edema (RD Assessment)  Moderate   Hair  Reviewed  Eyes  Reviewed  Mouth  Reviewed  Skin  Reviewed  Nails  Reviewed     Diet Order:   Diet Order            Diet 2 gram sodium Room service appropriate? Yes; Fluid consistency: Thin  Diet effective now             EDUCATION NEEDS:   Education needs have been addressed  Skin:  Skin Assessment: Reviewed RN Assessment(Stage II buttocks )  Last BM:  2/24- type 6  Height:   Ht Readings from Last 1 Encounters:  10/09/18 '5\' 5"'$  (1.651 m)    Weight:   Wt Readings from Last 1 Encounters:  10/11/18 103.3 kg    Ideal Body Weight:  56.8 kg  BMI:  Body mass index is 37.9 kg/m.  Estimated Nutritional Needs:   Kcal:  1600-1800kcal/day   Protein:  85-93g/day   Fluid:  1.4L/day   Koleen Distance MS, RD, LDN Pager #- [phone] Office#- [phone] After Hours Pager: [phone]

## 2018-10-11 NOTE — Clinical Social Work Note (Addendum)
CSW spoke to patient today, and she would rather go back to [facility]F.  CSW informed her that ALF will have to evaluate patient before she is able to return back to ALF.  CSW attempted to contact [facility]F to discuss what patient's baseline is like, had to leave a message waiting for call back.  Patient states she has been to [facility] in the past, and is okay with returning if she has to.  Patient requested for CSW to speak to her son.  CSW attempted to speak to son, and had to leave a message awaiting for call back, patient's daughter in law said patient was at [facility] in the past and they would like her to return to [facility] SNF if she needs to go to rehab.  Jones Broom. Christasia Angeletti, MSW, LCSW [phone]  10/11/2018 4:00 PM

## 2018-10-11 NOTE — Progress Notes (Signed)
[facility] [place] NAME: Michael Ventresca    MR#:  [id number]  DATE OF BIRTH:  03-12-1928  SUBJECTIVE:  CHIEF COMPLAINT:   Chief Complaint  Patient presents with  . Weakness  Patient seen and evaluated today Swelling in the lower extremities improving Shortness of breath better Weaned off oxygen  REVIEW OF SYSTEMS:    ROS  CONSTITUTIONAL: No documented fever. No fatigue, weakness. No weight gain, no weight loss.  EYES: No blurry or double vision.  ENT: No tinnitus. No postnasal drip. No redness of the oropharynx.  RESPIRATORY: No cough, no wheeze, no hemoptysis.  Has dyspnea.  CARDIOVASCULAR: No chest pain.  Has orthopnea. No palpitations. No syncope.  GASTROINTESTINAL: No nausea, no vomiting or diarrhea. No abdominal pain. No melena or hematochezia.  GENITOURINARY: No dysuria or hematuria.  ENDOCRINE: No polyuria or nocturia. No heat or cold intolerance.  HEMATOLOGY: No anemia. No bruising. No bleeding.  INTEGUMENTARY: No rashes. No lesions.  MUSCULOSKELETAL: No arthritis. Has swelling in lower extremities. No gout.  NEUROLOGIC: No numbness, tingling, or ataxia. No seizure-type activity.  PSYCHIATRIC: No anxiety. No insomnia. No ADD.   DRUG ALLERGIES:  No Known Allergies  VITALS:  Blood pressure (!) 108/56, pulse 70, temperature 98.2 F (36.8 C), temperature source Oral, resp. rate 19, height 5\' 5"  (1.651 m), weight 103.3 kg, SpO2 92 %.  PHYSICAL EXAMINATION:   Physical Exam  GENERAL:  85 y.o.-year-old patient lying in the bed with no acute distress.  EYES: Pupils equal, round, reactive to light and accommodation. No scleral icterus. Extraocular muscles intact.  HEENT: Head atraumatic, normocephalic. Oropharynx and nasopharynx clear.  NECK:  Supple, no jugular venous distention. No thyroid enlargement, no tenderness.  LUNGS: Decreased breath sounds bilaterally, bibasilar crepitations heard. No use of accessory muscles of  respiration.  CARDIOVASCULAR: S1, S2 normal. No murmurs, rubs, or gallops.  ABDOMEN: Soft, nontender, nondistended. Bowel sounds present. No organomegaly or mass.  EXTREMITIES: No cyanosis, clubbing  has edema b/l.    NEUROLOGIC: Cranial nerves II through XII are intact. No focal Motor or sensory deficits b/l.   PSYCHIATRIC: The patient is alert and oriented x 3.  SKIN: No obvious rash, lesion, or ulcer.   LABORATORY PANEL:   CBC Recent Labs  Lab 10/09/18 1035  WBC 6.4  HGB 9.6*  HCT 31.2*  PLT 198   ------------------------------------------------------------------------------------------------------------------ Chemistries  Recent Labs  Lab 10/09/18 1115  10/11/18 0455  NA 137   < > 141  K 4.3   < > 3.7  CL 102   < > 107  CO2 25   < > 24  GLUCOSE 88   < > 74  BUN 20   < > 20  CREATININE 1.47*   < > 1.56*  CALCIUM 8.5*   < > 8.6*  AST 25  --   --   ALT 7  --   --   ALKPHOS 136*  --   --   BILITOT 0.8  --   --    < > = values in this interval not displayed.   ------------------------------------------------------------------------------------------------------------------  Cardiac Enzymes Recent Labs  Lab 10/10/18 0504  TROPONINI <0.03   ------------------------------------------------------------------------------------------------------------------  RADIOLOGY:  No results found.   ASSESSMENT AND PLAN:   85 year old female patient with history of hypertension, anxiety disorder, chronic pain, hypothyroidism, CVA CKD stage III, congestive heart failure presented to the hospital for shortness of breath and lower extremity edema  -Decompensated heart failure Switch  to oral lasix Input output chart and daily body weights Continue ACE inhibitor and metoprolol  Echocardiogram EF 50 to 55% Monitor electrolytes  -Glaucoma Continue latanoprost eyedrops  -Hypothyroidism Continue oral Synthroid  -History of gout Continue  allopurinol  -Hypertension Continue beta-blocker  -Ambulatory dysfunction Status post physical therapy evaluation SNF placement recommended Social worker follow-up for SNF placement and insurance authorization  All the records are reviewed and case discussed with Care Management/Social Worker. Management plans discussed with the patient, family and they are in agreement.  CODE STATUS: DNR  DVT Prophylaxis: SCDs  TOTAL TIME TAKING CARE OF THIS PATIENT: 37 minutes.   POSSIBLE D/C IN 1 to 2 DAYS, DEPENDING ON CLINICAL CONDITION.  Saundra Shelling M.D on 10/11/2018 at 11:48 AM  Between 7am to 6pm - Pager - [phone]  After 6pm go to [email or web address] - password [email or web address] [facility] [facility]  Office  [phone]  CC: Primary care physician; Rosaland Lao, NP  Note: This dictation was prepared with Dragon dictation along with smaller phrase technology. Any transcriptional errors that result from this process are unintentional.

## 2018-10-12 LAB — BASIC METABOLIC PANEL
Anion gap: 9 (ref 5–15)
BUN: 22 mg/dL (ref 8–23)
CHLORIDE: 105 mmol/L (ref 98–111)
CO2: 26 mmol/L (ref 22–32)
Calcium: 8.6 mg/dL — ABNORMAL LOW (ref 8.9–10.3)
Creatinine, Ser: 1.58 mg/dL — ABNORMAL HIGH (ref 0.44–1.00)
GFR calc Af Amer: 34 mL/min — ABNORMAL LOW (ref >60)
GFR calc non Af Amer: 29 mL/min — ABNORMAL LOW (ref >60)
GLUCOSE: 94 mg/dL (ref 70–99)
Potassium: 3.7 mmol/L (ref 3.5–5.1)
Sodium: 140 mmol/L (ref 135–145)

## 2018-10-12 NOTE — Clinical Social Work Note (Signed)
CSW received phone call from [facility] admission worker Liliane Channel that they have received insurance auth on patient and they can accept her tomorrow if she is medically ready for discharge and orders have been received.  Jones Broom. Norval Morton, MSW, [place]  10/12/2018 6:33 PM

## 2018-10-12 NOTE — Care Management Important Message (Signed)
Copy of signed Medicare IM left with patient in room. 

## 2018-10-12 NOTE — Progress Notes (Signed)
Clinical Education officer, museum (CSW) contacted patient's son Kasandra Knudsen and presented bed offers. He chose [facility]. Per [facility] admissions coordinator at [place] he will start [facility] SNF authorization today.   [employer], LCSW [phone]

## 2018-10-12 NOTE — Progress Notes (Signed)
[facility] NAME: [name]    MR#:  [id number]  DATE OF BIRTH:  Mar 05, 1928  SUBJECTIVE:  CHIEF COMPLAINT:   Chief Complaint  Patient presents with  . Weakness  Patient seen and evaluated today Swelling in the lower extremities improving day by day Shortness of breath better  off oxygen Comfortably breathing on room air  REVIEW OF SYSTEMS:    ROS  CONSTITUTIONAL: No documented fever. No fatigue, weakness. No weight gain, no weight loss.  EYES: No blurry or double vision.  ENT: No tinnitus. No postnasal drip. No redness of the oropharynx.  RESPIRATORY: No cough, no wheeze, no hemoptysis.  Has decreased dyspnea.  CARDIOVASCULAR: No chest pain.  Has orthopnea. No palpitations. No syncope.  GASTROINTESTINAL: No nausea, no vomiting or diarrhea. No abdominal pain. No melena or hematochezia.  GENITOURINARY: No dysuria or hematuria.  ENDOCRINE: No polyuria or nocturia. No heat or cold intolerance.  HEMATOLOGY: No anemia. No bruising. No bleeding.  INTEGUMENTARY: No rashes. No lesions.  MUSCULOSKELETAL: No arthritis. Has swelling in lower extremities. No gout.  NEUROLOGIC: No numbness, tingling, or ataxia. No seizure-type activity.  PSYCHIATRIC: No anxiety. No insomnia. No ADD.   DRUG ALLERGIES:  No Known Allergies  VITALS:  Blood pressure (!) 113/57, pulse 78, temperature 98 F (36.7 C), temperature source Oral, resp. rate 16, height 5\' 5"  (1.651 m), weight 91 kg, SpO2 92 %.  PHYSICAL EXAMINATION:   Physical Exam  GENERAL:  85 y.o.-year-old patient lying in the bed with no acute distress.  EYES: Pupils equal, round, reactive to light and accommodation. No scleral icterus. Extraocular muscles intact.  HEENT: Head atraumatic, normocephalic. Oropharynx and nasopharynx clear.  NECK:  Supple, no jugular venous distention. No thyroid enlargement, no tenderness.  LUNGS: Improved breath sounds bilaterally,decreased bibasilar  crepitations heard. No use of accessory muscles of respiration.  CARDIOVASCULAR: S1, S2 normal. No murmurs, rubs, or gallops.  ABDOMEN: Soft, nontender, nondistended. Bowel sounds present. No organomegaly or mass.  EXTREMITIES: No cyanosis, clubbing  has edema b/l.    NEUROLOGIC: Cranial nerves II through XII are intact. No focal Motor or sensory deficits b/l.   PSYCHIATRIC: The patient is alert and oriented x 3.  SKIN: No obvious rash, lesion, or ulcer.   LABORATORY PANEL:   CBC Recent Labs  Lab 10/09/18 1035  WBC 6.4  HGB 9.6*  HCT 31.2*  PLT 198   ------------------------------------------------------------------------------------------------------------------ Chemistries  Recent Labs  Lab 10/09/18 1115  10/12/18 0313  NA 137   < > 140  K 4.3   < > 3.7  CL 102   < > 105  CO2 25   < > 26  GLUCOSE 88   < > 94  BUN 20   < > 22  CREATININE 1.47*   < > 1.58*  CALCIUM 8.5*   < > 8.6*  AST 25  --   --   ALT 7  --   --   ALKPHOS 136*  --   --   BILITOT 0.8  --   --    < > = values in this interval not displayed.   ------------------------------------------------------------------------------------------------------------------  Cardiac Enzymes Recent Labs  Lab 10/10/18 0504  TROPONINI <0.03   ------------------------------------------------------------------------------------------------------------------  RADIOLOGY:  No results found.   ASSESSMENT AND PLAN:   85 year old female patient with history of hypertension, anxiety disorder, chronic pain, hypothyroidism, CVA CKD stage III, congestive heart failure presented to the hospital for shortness of breath  and lower extremity edema  -Decompensated heart failure Continue oral lasix for diuresis Input output chart and daily body weights Continue ACE inhibitor and metoprolol  Echocardiogram EF 50 to 55% Monitor electrolytes  -Glaucoma Continue latanoprost eyedrops  -Hypothyroidism Continue oral  Synthroid  -History of gout Continue allopurinol  -Hypertension Continue beta-blocker  -Ambulatory dysfunction Status post physical therapy evaluation SNF placement recommended Social worker follow-up for SNF placement and insurance authorization Insurance authorization pending  All the records are reviewed and case discussed with Care Management/Social Worker. Management plans discussed with the patient, family and they are in agreement.  CODE STATUS: DNR  DVT Prophylaxis: SCDs  TOTAL TIME TAKING CARE OF THIS PATIENT: 35 minutes.   POSSIBLE D/C IN 1 to 2 DAYS, DEPENDING ON CLINICAL CONDITION.  Saundra Shelling M.D on 10/12/2018 at 1:54 PM  Between 7am to 6pm - Pager - [phone]  After 6pm go to [email or web address] - password [email or web address] [facility]  Office  [phone]  CC: Primary care physician; Rosaland Lao, NP  Note: This dictation was prepared with Dragon dictation along with smaller phrase technology. Any transcriptional errors that result from this process are unintentional.

## 2018-10-13 LAB — CBC
HCT: 28 % — ABNORMAL LOW (ref 36.0–46.0)
Hemoglobin: 8.6 g/dL — ABNORMAL LOW (ref 12.0–15.0)
MCH: 28.8 pg (ref 26.0–34.0)
MCHC: 30.7 g/dL (ref 30.0–36.0)
MCV: 93.6 fL (ref 80.0–100.0)
NRBC: 0 % (ref 0.0–0.2)
Platelets: 181 10*3/uL (ref 150–400)
RBC: 2.99 MIL/uL — ABNORMAL LOW (ref 3.87–5.11)
RDW: 17.6 % — ABNORMAL HIGH (ref 11.5–15.5)
WBC: 7.1 10*3/uL (ref 4.0–10.5)

## 2018-10-13 LAB — BASIC METABOLIC PANEL
Anion gap: 8 (ref 5–15)
BUN: 25 mg/dL — ABNORMAL HIGH (ref 8–23)
CO2: 26 mmol/L (ref 22–32)
Calcium: 8.3 mg/dL — ABNORMAL LOW (ref 8.9–10.3)
Chloride: 107 mmol/L (ref 98–111)
Creatinine, Ser: 1.62 mg/dL — ABNORMAL HIGH (ref 0.44–1.00)
GFR calc Af Amer: 33 mL/min — ABNORMAL LOW (ref >60)
GFR calc non Af Amer: 28 mL/min — ABNORMAL LOW (ref >60)
Glucose, Bld: 83 mg/dL (ref 70–99)
Potassium: 4 mmol/L (ref 3.5–5.1)
Sodium: 141 mmol/L (ref 135–145)

## 2018-10-13 MED ORDER — TRAMADOL HCL 50 MG PO TABS: 50.0000 mg | ORAL_TABLET | Freq: Three times a day (TID) | ORAL | 0 refills | Status: AC | PRN

## 2018-10-13 NOTE — Discharge Summary (Signed)
[facility] NAME: [name]    MR#:  [id number]  DATE OF BIRTH:  03/30/1928  DATE OF ADMISSION:  10/09/2018 ADMITTING PHYSICIAN: [name] Leber, MD  DATE OF DISCHARGE: 10/13/2018  PRIMARY CARE PHYSICIAN: [name] Lao, NP   ADMISSION DIAGNOSIS:  Acute pulmonary edema ([place]) [J81.0] Weakness [R53.1] Acute congestive heart failure, unspecified heart failure type ([place]) [I50.9]  DISCHARGE DIAGNOSIS:  Acute on chronic diastolic heart failure exacerbation Dyspnea secondary to heart failure exacerbation Fluid overload Ambulatory dysfunction SECONDARY DIAGNOSIS:   Past Medical History:  Diagnosis Date  . Abnormal glucose   . Anxiety   . Arthritis   . Asthma   . Calculus of gallbladder without mention of cholecystitis, with obstruction   . Cancer ([place])    kidney  . Candidiasis of the esophagus   . Cataract cortical, senile   . Chronic kidney disease, stage III (moderate) (HCC)   . Chronic pain   . Chronic pain syndrome   . Corns and callosities   . Debility   . Depression   . Dermatophytosis of nail   . Diabetes mellitus without complication ([place])    unspecified, without mention of complication  . Difficult intubation   . Dysphagia   . Dyspnea    with exertion  . Edema, unspecified   . Emphysema lung ([place])   . Emphysema of lung ([place])   . Esophageal reflux   . Essential hypertension, benign   . Generalized atherosclerosis   . Glaucoma (increased eye pressure)   . Gout, unspecified   . Hemiplegia ([place])   . Hereditary and idiopathic peripheral neuropathy   . Hyperglycemia   . Hyperlipidemia   . Hypertension   . Hypoglycemia, unspecified   . Hypopotassemia   . Hypothyroidism   . Migraine, unspecified, without mention of intractable migraine without mention of status migrainosus   . Other vitamin B12 deficiency anemia   . Pain in limb   . Peripheral vascular disease ([place])   . Personal history of fall    . Stroke ([place])   . Thyroid disease   . Ulcer of foot ([place])   . Unspecified asthma(493.90)   . Unspecified constipation   . Unspecified hypertensive heart disease with heart failure(402.91)   . Unspecified urinary incontinence   . Unspecified vitamin D deficiency      ADMITTING HISTORY [name]  is a 85 y.o. female with a known history of essential hypertension, anxiety, depression, chronic pain syndrome, Hypothyroidism, previous CVA who presents to the hospital from assisted living due to shortness of breath and worsening lower extremity edema.  Patient was in her usual state of health but over the past few months has developed progressive exertional dyspnea and also increasing lower extremity edema.  She does not have a previous history of congestive heart failure.  With minimal exertion she was getting significant shortness of breath and therefore sent to the ER for further evaluation.  Patient underwent chest x-ray in the ER which was suggestive of pulmonary edema and volume overload consistent with congestive heart failure.  Hospitalist services were contacted for admission.  Patient denies any palpitations, chest pains admits to some intermittent nausea but no vomiting.  Patient does admit to a chronic 2 pillow orthopnea but no paroxysmal nocturnal dyspnea.  She does admit to worsening lower extreme edema but does not know how much weight she has gained.  HOSPITAL COURSE:  Patient was admitted to telemetry.  Patient was  diuresed with IV Lasix.  Fluid overload improved and shortness of breath also improved with diuresis.  Patient was replaced.  Patient was worked up with echocardiogram. Echocardiogram report  Left Ventricle: The left ventricle has low normal systolic function, with an ejection fraction of 50-55%. The cavity size was mildly dilated. There is no increase in left ventricular wall thickness. Left ventricular diastolic Doppler parameters are  consistent with impaired  relaxation Right Ventricle: The right ventricle has mildly reduced systolic function. The cavity was moderately enlarged. There is no increase in right ventricular wall thickness. Left Atrium: left atrial size was normal in size Right Atrium: right atrial size was severely dilated. Right atrial pressure is estimated at 10 mmHg. Interatrial Septum: No atrial level shunt detected by color flow Doppler. Pericardium: The pericardium was not well visualized. Mitral Valve: Borderline MVP. Mild thickening of the mitral valve leaflet. Mild calcification of the mitral valve leaflet. Mitral valve regurgitation is moderate to severe by color flow Doppler. Tricuspid Valve: The tricuspid valve is normal in structure. Tricuspid valve regurgitation is severe by color flow Doppler. Aortic Valve: The aortic valve was not well visualized Aortic valve regurgitation was not assessed by color flow Doppler. Pulmonic Valve: The pulmonic valve was not well visualized. Pulmonic valve regurgitation was not assessed by color flow Doppler. She was seen by [facility] cardiology.  No acute cardiac intervention recommended.  Patient was weaned off oxygen.  Swelling in the legs improved.  Physical therapy evaluated patient and recommended SNF placement.  Patient unable to take care of herself at assisted living facility.  Social worker consult was done and patient has a bed at [facility] facility.   CONSULTS OBTAINED:  Treatment Team:  [name] Kida, MD  DRUG ALLERGIES:  No Known Allergies  DISCHARGE MEDICATIONS:   Allergies as of 10/13/2018   No Known Allergies     Medication List    TAKE these medications   acetaminophen 325 MG tablet Commonly known as:  TYLENOL Take 650 mg by mouth every 4 (four) hours as needed for mild pain.   allopurinol 100 MG tablet Commonly known as:  ZYLOPRIM Take 100 mg by mouth daily.   amitriptyline 25 MG tablet Commonly known as:  ELAVIL Take 12.5 mg by mouth 2 (two) times  daily.   aspirin 81 MG EC tablet Take 1 tablet (81 mg total) by mouth daily. What changed:  Another medication with the same name was removed. Continue taking this medication, and follow the directions you see here.   atorvastatin 20 MG tablet Commonly known as:  LIPITOR Take 20 mg by mouth daily. What changed:  Another medication with the same name was removed. Continue taking this medication, and follow the directions you see here.   clopidogrel 75 MG tablet Commonly known as:  PLAVIX Take 1 tablet (75 mg total) by mouth daily.   DULoxetine 30 MG capsule Commonly known as:  CYMBALTA Take 30 mg by mouth every morning.   furosemide 40 MG tablet Commonly known as:  LASIX Take 40 mg by mouth daily. What changed:  Another medication with the same name was removed. Continue taking this medication, and follow the directions you see here.   gabapentin 400 MG capsule Commonly known as:  NEURONTIN Take 400 mg by mouth 2 (two) times daily. What changed:  Another medication with the same name was removed. Continue taking this medication, and follow the directions you see here.   latanoprost 0.005 % ophthalmic solution Commonly known as:  XALATAN Place 1 drop into both eyes at bedtime.   levothyroxine 25 MCG tablet Commonly known as:  SYNTHROID, LEVOTHROID Take 25 mcg by mouth daily before breakfast.   metoprolol tartrate 25 MG tablet Commonly known as:  LOPRESSOR Take 25 mg by mouth 2 (two) times daily.   ondansetron 4 MG tablet Commonly known as:  ZOFRAN Take 4 mg by mouth every 8 (eight) hours as needed for nausea or vomiting.   pantoprazole 40 MG tablet Commonly known as:  PROTONIX Take 40 mg by mouth daily.   polyethylene glycol packet Commonly known as:  MIRALAX / GLYCOLAX Take 17 g by mouth daily as needed for mild constipation or moderate constipation.   potassium chloride 10 MEQ tablet Commonly known as:  K-DUR,KLOR-CON Take 10 mEq by mouth daily.   senna 8.6 MG  tablet Commonly known as:  SENOKOT Take 2 tablets by mouth 2 (two) times daily.   topiramate 25 MG tablet Commonly known as:  TOPAMAX Take 25 mg by mouth at bedtime.   traMADol 50 MG tablet Commonly known as:  ULTRAM Take 1 tablet (50 mg total) by mouth every 8 (eight) hours as needed (for pain control). What changed:  Another medication with the same name was removed. Continue taking this medication, and follow the directions you see here.   Vitamin D-3 25 MCG (1000 UT) Caps Take 2,000 Units by mouth daily.       Today  Patient seen today No complaints of any chest pain No shortness of breath Hemodynamically stable Will be discharged to SNF VITAL SIGNS:  Blood pressure (!) 102/57, pulse (!) 119, temperature 98.3 F (36.8 C), temperature source Oral, resp. rate 20, height 5\' 5"  (1.651 m), weight 100.1 kg, SpO2 91 %.  I/O:    Intake/Output Summary (Last 24 hours) at 10/13/2018 0936 Last data filed at 10/12/2018 2128 Gross per 24 hour  Intake 3 ml  Output 1000 ml  Net -997 ml    PHYSICAL EXAMINATION:  Physical Exam  GENERAL:  85 y.o.-year-old patient lying in the bed with no acute distress.  LUNGS: Normal breath sounds bilaterally, no wheezing, rales,rhonchi or crepitation. No use of accessory muscles of respiration.  CARDIOVASCULAR: S1, S2 normal. No murmurs, rubs, or gallops.  ABDOMEN: Soft, non-tender, non-distended. Bowel sounds present. No organomegaly or mass.  NEUROLOGIC: Moves all 4 extremities. PSYCHIATRIC: The patient is alert and oriented x 3.  SKIN: No obvious rash, lesion, or ulcer.   DATA REVIEW:   CBC Recent Labs  Lab 10/13/18 0539  WBC 7.1  HGB 8.6*  HCT 28.0*  PLT 181    Chemistries  Recent Labs  Lab 10/09/18 1115  10/13/18 0539  NA 137   < > 141  K 4.3   < > 4.0  CL 102   < > 107  CO2 25   < > 26  GLUCOSE 88   < > 83  BUN 20   < > 25*  CREATININE 1.47*   < > 1.62*  CALCIUM 8.5*   < > 8.3*  AST 25  --   --   ALT 7  --   --    ALKPHOS 136*  --   --   BILITOT 0.8  --   --    < > = values in this interval not displayed.    Cardiac Enzymes Recent Labs  Lab 10/10/18 0504  TROPONINI <0.03    Microbiology Results  Results for orders placed or performed during the  hospital encounter of 10/09/18  MRSA PCR Screening     Status: None   Collection Time: 10/09/18  5:49 PM  Result Value Ref Range Status   MRSA by PCR NEGATIVE NEGATIVE Final    Comment:        The GeneXpert MRSA Assay (FDA approved for NASAL specimens only), is one component of a comprehensive MRSA colonization surveillance program. It is not intended to diagnose MRSA infection nor to guide or monitor treatment for MRSA infections. Performed at [facility], [address] [place], [place] [address]     RADIOLOGY:  No results found.  Follow up with PCP in 1 week.  Management plans discussed with the patient, family and they are in agreement.  CODE STATUS: DNR    Code Status Orders  (From admission, onward)         Start     Ordered   10/09/18 1702  Do not attempt resuscitation (DNR)  Continuous    Question Answer Comment  In the event of cardiac or respiratory ARREST Do not call a "code blue"   In the event of cardiac or respiratory ARREST Do not perform Intubation, CPR, defibrillation or ACLS   In the event of cardiac or respiratory ARREST Use medication by any route, position, wound care, and other measures to relive pain and suffering. May use oxygen, suction and manual treatment of airway obstruction as needed for comfort.      10/09/18 1701        Code Status History    Date Active Date Inactive Code Status Order ID Comments User Context   05/18/2018 1259 05/18/2018 1739 Full Code [id number]  Katha Cabal, MD Inpatient   02/23/2018 1201 02/23/2018 1644 Full Code [id number]  Katha Cabal, MD Inpatient   12/09/2017 1646 12/11/2017 1850 DNR [id number]  Demetrios Loll, MD Inpatient   05/03/2017 0349 05/04/2017 2209  Full Code [id number]  Saundra Shelling, MD Inpatient   10/07/2016 1203 10/07/2016 1738 Full Code [id number]  Delana Meyer, Dolores Lory, MD Inpatient   09/16/2016 1027 09/16/2016 1711 Full Code [id number]  Katha Cabal, MD Inpatient   09/02/2016 1051 09/02/2016 1601 Full Code [id number]  Katha Cabal, MD Inpatient   07/22/2016 1002 07/22/2016 2318 DNR [id number]  Fritzi Mandes, MD Inpatient   07/20/2016 1455 07/22/2016 1002 Full Code [id number]  Hillary Bow, MD ED      TOTAL TIME TAKING CARE OF THIS PATIENT ON DAY OF DISCHARGE: more than 35 minutes.   Saundra Shelling M.D on 10/13/2018 at 9:36 AM  Between 7am to 6pm - Pager - [phone]  After 6pm go to [email or web address] - password [email or web address] [facility]  Office  [phone]  CC: Primary care physician; [name] Lao, NP  Note: This dictation was prepared with Dragon dictation along with smaller phrase technology. Any transcriptional errors that result from this process are unintentional.

## 2018-10-13 NOTE — Discharge Instructions (Signed)
Weakness Weakness is a lack of strength. You may feel weak all over your body (generalized), or you may feel weak in one specific part of your body (focal). Common causes of weakness include:  Infection and immune system disorders.  Physical exhaustion.  Internal bleeding or other blood loss that results in a lack of red blood cells (anemia).  Dehydration.  An imbalance in mineral (electrolyte) levels, such as potassium.  Heart disease, circulation problems, or stroke. Other causes include:  Some medicines or cancer treatment.  Stress, anxiety, or depression.  Nervous system disorders.  Thyroid disorders.  Loss of muscle strength because of age or inactivity.  Poor sleep quality or sleep disorders. The cause of your weakness may not be known. Some causes of weakness can be serious, so it is important to see your health care provider. Follow these instructions at home: Activity  Rest as needed.  Try to get enough sleep. Most adults need 7-8 hours of quality sleep each night. Talk to your health care provider about how much sleep you need each night.  Do exercises, such as arm curls and leg raises, for 30 minutes at least 2 days a week or as told by your health care provider. This helps build muscle strength.  Consider working with a physical therapist or trainer who can develop an exercise plan to help you gain muscle strength. General instructions   Take over-the-counter and prescription medicines only as told by your health care provider.  Eat a healthy, well-balanced diet. This includes: ? Proteins to build muscles, such as lean meats and fish. ? Fresh fruits and vegetables. ? Carbohydrates to boost energy, such as whole grains.  Drink enough fluid to keep your urine pale yellow.  Keep all follow-up visits as told by your health care provider. This is important. Contact a health care provider if your weakness:  Does not improve or gets worse.  Affects your  ability to think clearly.  Affects your ability to do your normal daily activities. Get help right away if you:  Develop sudden weakness, especially on one side of your face or body.  Have chest pain.  Have trouble breathing or shortness of breath.  Have problems with your vision.  Have trouble talking or swallowing.  Have trouble standing or walking.  Are light-headed or lose consciousness. Summary  Weakness is a lack of strength. You may feel weak all over your body or just in one specific part of your body.  Weakness can be caused by a variety of things. In some cases, the cause may be unknown.  Rest as needed, and try to get enough sleep. Most adults need 7-8 hours of quality sleep each night.  Eat a healthy, well-balanced diet. This information is not intended to replace advice given to you by your health care provider. Make sure you discuss any questions you have with your health care provider. Document Released: 08/04/2005 Document Revised: 03/10/2018 Document Reviewed: 03/10/2018 Elsevier Interactive Patient Education  2019 [employer].

## 2018-10-13 NOTE — Progress Notes (Signed)
Called and gave report to the receiving facility nurse at this time. All questions answered. Shavanna Furnari M Javious Hallisey  Called [facility] and requested non-emergent patient transport to the receiving facility at this time. Awaiting arrival. Kyarra Vancamp M Loral Campi 

## 2018-10-13 NOTE — Clinical Social Work Note (Signed)
Patient is medically stable for discharge today. CSW notified patient's son Ashok Pall [phone] of discharge to [facility] today. CSW also notified Rick at [employer] of discharge today. Liliane Channel states that they have [place] and patient will go to room D6. Patient will be transported by EMS. RN to call report and call for transport.   [place], [place]

## 2018-10-14 ENCOUNTER — Encounter (INDEPENDENT_AMBULATORY_CARE_PROVIDER_SITE_OTHER): Payer: Medicare Other

## 2018-10-14 ENCOUNTER — Ambulatory Visit (INDEPENDENT_AMBULATORY_CARE_PROVIDER_SITE_OTHER): Payer: Medicare Other | Admitting: Vascular Surgery

## 2018-10-19 ENCOUNTER — Ambulatory Visit: Payer: Medicare Other | Admitting: Family

## 2018-10-25 NOTE — Progress Notes (Signed)
Patient ID: [name], female    DOB: 05-22-1928, 85 y.o.   MRN: [id number]  HPI  [name] is a 85 y/o female with a history of DM, hyperlipidemia, HTN, CKD, stroke, thyroid disease, depression, gout, asthma, anxiety, PVD and chronic heart failure.   Echo report from 10/10/2018 reviewed and showed an EF of 50-55% along with mod/ severe MR and severe TR.   Admitted 10/09/2018 due to HF exacerbation. Initially needed IV lasix and then transitioned to oral diuretics. PT consult done. Discharged after 4 days to SNF.   [name] presents today for her initial visit with a chief complaint of moderate shortness of breath upon minimal exertion. [name] describes this as chronic in nature having been present for several months and some days it's better than others. [name] has associated fatigue, pedal edema, light-headedness and occasional abdominal distention. [name] denies any difficulty sleeping, palpitations, chest pain or wheezing. Thinks that [name]'s getting weighed daily at [facility]. Unsure of how much [name]'s drinking but says that it's "a lot".   Past Medical History:  Diagnosis Date  . Abnormal glucose   . Anxiety   . Arthritis   . Asthma   . Calculus of gallbladder without mention of cholecystitis, with obstruction   . Cancer ([place])    kidney  . Candidiasis of the esophagus   . Cataract cortical, senile   . CHF (congestive heart failure) ([place])   . Chronic kidney disease, stage III (moderate) (HCC)   . Chronic pain   . Chronic pain syndrome   . Corns and callosities   . Debility   . Depression   . Dermatophytosis of nail   . Diabetes mellitus without complication ([place])    unspecified, without mention of complication  . Difficult intubation   . Dysphagia   . Dyspnea    with exertion  . Edema, unspecified   . Emphysema lung ([place])   . Emphysema of lung ([place])   . Esophageal reflux   . Essential hypertension, benign   . Generalized atherosclerosis   . Glaucoma (increased eye pressure)   .  Gout, unspecified   . Hemiplegia ([place])   . Hereditary and idiopathic peripheral neuropathy   . Hyperglycemia   . Hyperlipidemia   . Hypertension   . Hypoglycemia, unspecified   . Hypopotassemia   . Hypothyroidism   . Migraine, unspecified, without mention of intractable migraine without mention of status migrainosus   . Other vitamin B12 deficiency anemia   . Pain in limb   . Peripheral vascular disease ([place])   . Personal history of fall   . Stroke ([place])   . Thyroid disease   . Ulcer of foot ([place])   . Unspecified asthma(493.90)   . Unspecified constipation   . Unspecified hypertensive heart disease with heart failure(402.91)   . Unspecified urinary incontinence   . Unspecified vitamin D deficiency    Past Surgical History:  Procedure Laterality Date  . ABDOMINAL HYSTERECTOMY    . arterial thrombi stents in both legs    . BACK SURGERY    . BACK SURGERY    . CATARACT EXTRACTION    . COLONOSCOPY    . ESOPHAGOGASTRODUODENOSCOPY (EGD) WITH PROPOFOL N/A 06/19/2016   Procedure: ESOPHAGOGASTRODUODENOSCOPY (EGD) WITH PROPOFOL;  Surgeon: Lollie Sails, MD;  Location: [facility] ENDOSCOPY;  Service: Endoscopy;  Laterality: N/A;  . ESOPHAGOGASTRODUODENOSCOPY (EGD) WITH PROPOFOL N/A 04/10/2017   Procedure: ESOPHAGOGASTRODUODENOSCOPY (EGD) WITH PROPOFOL;  Surgeon: Lollie Sails, MD;  Location: [facility] ENDOSCOPY;  Service: Endoscopy;  Laterality: N/A;  . EYE SURGERY     cataract extraction  . LOWER EXTREMITY ANGIOGRAPHY Right 10/07/2016   Procedure: Lower Extremity Angiography;  Surgeon: Katha Cabal, MD;  Location: [place] CV LAB;  Service: Cardiovascular;  Laterality: Right;  . LOWER EXTREMITY ANGIOGRAPHY Right 02/23/2018   Procedure: LOWER EXTREMITY ANGIOGRAPHY;  Surgeon: Katha Cabal, MD;  Location: [place] [facility];  Service: Cardiovascular;  Laterality: Right;  . LOWER EXTREMITY ANGIOGRAPHY Left 05/18/2018   Procedure: LOWER EXTREMITY ANGIOGRAPHY;  Surgeon: Katha Cabal, MD;  Location: [place] [facility];  Service: Cardiovascular;  Laterality: Left;  . PERIPHERAL VASCULAR CATHETERIZATION Left 09/02/2016   Procedure: Lower Extremity Angiography;  Surgeon: Katha Cabal, MD;  Location: [place] [facility];  Service: Cardiovascular;  Laterality: Left;  . PERIPHERAL VASCULAR CATHETERIZATION Right 09/16/2016   Procedure: Lower Extremity Angiography;  Surgeon: Katha Cabal, MD;  Location: [place] [facility];  Service: Cardiovascular;  Laterality: Right;   Family History  Problem Relation Age of Onset  . Cancer Father   . Stroke Father   . Seizures Mother   . Lung cancer Sister   . Bone cancer Sister    Social History   Tobacco Use  . Smoking status: Never Smoker  . Smokeless tobacco: Never Used  Substance Use Topics  . Alcohol use: No   No Known Allergies Prior to Admission medications   Medication Sig Start Date End Date Taking? Authorizing Provider  acetaminophen (TYLENOL) 325 MG tablet Take 650 mg by mouth every 4 (four) hours as needed for mild pain.    Yes [provider]  allopurinol (ZYLOPRIM) 100 MG tablet Take 100 mg by mouth daily.   Yes [provider]  amitriptyline (ELAVIL) 25 MG tablet Take 12.5 mg by mouth 2 (two) times daily.    Yes [provider]  aspirin EC 81 MG EC tablet Take 1 tablet (81 mg total) by mouth daily. 07/22/16  Yes Fritzi Mandes, MD  atorvastatin (LIPITOR) 20 MG tablet Take 20 mg by mouth daily.   Yes [provider]  Cholecalciferol (VITAMIN D-3) 1000 UNITS CAPS Take 2,000 Units by mouth daily.    Yes [provider]  clopidogrel (PLAVIX) 75 MG tablet Take 1 tablet (75 mg total) by mouth daily. 02/24/18 02/24/19 Yes Schnier, Dolores Lory, MD  DULoxetine (CYMBALTA) 30 MG capsule Take 30 mg by mouth every morning.   Yes [provider]  furosemide (LASIX) 40 MG tablet Take 40 mg by mouth daily.   Yes [provider]  gabapentin (NEURONTIN) 400 MG  capsule Take 400 mg by mouth 2 (two) times daily.  11/28/17  Yes [provider]  latanoprost (XALATAN) 0.005 % ophthalmic solution Place 1 drop into both eyes at bedtime.   Yes [provider]  levothyroxine (SYNTHROID, LEVOTHROID) 25 MCG tablet Take 25 mcg by mouth daily before breakfast.   Yes [provider]  metolazone (ZAROXOLYN) 2.5 MG tablet Take 2.5 mg by mouth daily. Give on 3/11, 3/13 and 3/16 30 min before Lasix   Yes [provider]  metoprolol tartrate (LOPRESSOR) 25 MG tablet Take 50 mg by mouth 2 (two) times daily.    Yes [provider]  ondansetron (ZOFRAN) 4 MG tablet Take 4 mg by mouth every 8 (eight) hours as needed for nausea or vomiting.   Yes [provider]  pantoprazole (PROTONIX) 40 MG tablet Take 40 mg by mouth daily.  Yes [provider]  polyethylene glycol (MIRALAX / GLYCOLAX) packet Take 17 g by mouth daily as needed for mild constipation or moderate constipation.   Yes [provider]  potassium chloride (K-DUR,KLOR-CON) 10 MEQ tablet Take 10 mEq by mouth daily.   Yes [provider]  senna (SENOKOT) 8.6 MG tablet Take 2 tablets by mouth 2 (two) times daily.    Yes [provider]  topiramate (TOPAMAX) 25 MG tablet Take 25 mg by mouth at bedtime.   Yes [provider]  traMADol (ULTRAM) 50 MG tablet Take 1 tablet (50 mg total) by mouth every 8 (eight) hours as needed (for pain control). 10/13/18  Yes Pyreddy, Reatha Harps, MD    Review of Systems  Constitutional: Positive for fatigue (with little exertion). Negative for appetite change.  HENT: Positive for rhinorrhea. Negative for congestion and sore throat.   Eyes: Negative.   Respiratory: Positive for shortness of breath. Negative for cough and wheezing.   Cardiovascular: Positive for leg swelling. Negative for chest pain and palpitations.  Gastrointestinal: Positive for abdominal distention ("at times"). Negative for  abdominal pain.  Endocrine: Negative.   Genitourinary: Negative.   Musculoskeletal: Positive for arthralgias (around ankles). Negative for back pain and neck pain.  Skin: Negative.   Allergic/Immunologic: Negative.   Neurological: Positive for light-headedness. Negative for dizziness.  Hematological: Negative for adenopathy. Does not bruise/bleed easily.  Psychiatric/Behavioral: Negative for dysphoric mood and sleep disturbance (sleeping on 3 pillows). The patient is not nervous/anxious.    Vitals:   10/27/18 0856  BP: 125/63  Pulse: 89  Resp: 18   Wt Readings from Last 3 Encounters:  10/13/18 220 lb 9.6 oz (100.1 kg)  09/02/18 210 lb 3.2 oz (95.3 kg)  07/13/18 199 lb (90.3 kg)   Lab Results  Component Value Date   CREATININE 1.62 (H) 10/13/2018   CREATININE 1.58 (H) 10/12/2018   CREATININE 1.56 (H) 10/11/2018     Physical Exam Vitals signs and nursing note reviewed.  Constitutional:      Appearance: [name] is well-developed.  HENT:     Head: Normocephalic and atraumatic.  Neck:     Musculoskeletal: Normal range of motion and neck supple.     Vascular: No JVD.  Cardiovascular:     Rate and Rhythm: Normal rate. Rhythm irregular.  Pulmonary:     Effort: Pulmonary effort is normal. No respiratory distress.     Breath sounds: No wheezing or rales.  Musculoskeletal:     Right lower leg: [name] exhibits tenderness. Edema (3+ pitting) present.     Left lower leg: [name] exhibits tenderness. Edema (3+ pitting) present.  Skin:    General: Skin is warm and dry.  Neurological:     Mental Status: [name] is alert and oriented to person, place, and time.  Psychiatric:        Mood and Affect: Mood normal.        Behavior: Behavior normal.     Assessment & Plan:  1: Chronic heart failure with preserved ejection fraction- - NYHA class III - fluid overloaded today - order written for her to be weighed daily and for [facility] to call for an overnight weight gain of >2 pounds or a weekly  weight gain of >5 pounds - did not get weighed in the office today because [name] doesn't think [name] can safely stand - not adding salt and says that the food doesn't taste salty - has order already for metolazone for 3 doses - order written  for her to keep fluid intake to 60 ounces daily - BNP 10/09/2018 was 210.0 - PharmD reconciled medications  2: HTN- - BP looks good today - sees PCP at SNF - BMP from 10/13/2018 reviewed and showed sodium 141, potassium 4.0, creatinine 1.62 and GFR 33  3: DM- - A1c 05/03/17 was 5.4% - currently diet controlled - saw vascular 09/02/2018  4: Lymphedema- - stage 2 - limited in her ability to exercise due to her shortness of breath - elevating her legs when sitting for long periods of time - order written for support socks to be applied every morning with removal at bedtime - consider lymphapress compression boots if edema persists  Facility medication list was reviewed.  Return in 6 weeks or sooner for any questions/problems before then.

## 2018-10-27 ENCOUNTER — Encounter: Payer: Self-pay | Admitting: Pharmacist

## 2018-10-27 ENCOUNTER — Other Ambulatory Visit: Payer: Self-pay

## 2018-10-27 ENCOUNTER — Ambulatory Visit: Payer: Medicare Other | Attending: Family | Admitting: Family

## 2018-10-27 ENCOUNTER — Encounter: Payer: Self-pay | Admitting: Family

## 2018-10-27 VITALS — BP 125/63 | HR 89 | Resp 18

## 2018-10-27 DIAGNOSIS — E119 Type 2 diabetes mellitus without complications: Secondary | ICD-10-CM | POA: Insufficient documentation

## 2018-10-27 DIAGNOSIS — Z8673 Personal history of transient ischemic attack (TIA), and cerebral infarction without residual deficits: Secondary | ICD-10-CM | POA: Insufficient documentation

## 2018-10-27 DIAGNOSIS — E785 Hyperlipidemia, unspecified: Secondary | ICD-10-CM | POA: Diagnosis not present

## 2018-10-27 DIAGNOSIS — Z79890 Hormone replacement therapy (postmenopausal): Secondary | ICD-10-CM | POA: Diagnosis not present

## 2018-10-27 DIAGNOSIS — J45909 Unspecified asthma, uncomplicated: Secondary | ICD-10-CM | POA: Insufficient documentation

## 2018-10-27 DIAGNOSIS — I5032 Chronic diastolic (congestive) heart failure: Secondary | ICD-10-CM | POA: Insufficient documentation

## 2018-10-27 DIAGNOSIS — G894 Chronic pain syndrome: Secondary | ICD-10-CM | POA: Insufficient documentation

## 2018-10-27 DIAGNOSIS — E1151 Type 2 diabetes mellitus with diabetic peripheral angiopathy without gangrene: Secondary | ICD-10-CM | POA: Diagnosis not present

## 2018-10-27 DIAGNOSIS — J439 Emphysema, unspecified: Secondary | ICD-10-CM | POA: Insufficient documentation

## 2018-10-27 DIAGNOSIS — E1122 Type 2 diabetes mellitus with diabetic chronic kidney disease: Secondary | ICD-10-CM | POA: Diagnosis not present

## 2018-10-27 DIAGNOSIS — E559 Vitamin D deficiency, unspecified: Secondary | ICD-10-CM | POA: Diagnosis not present

## 2018-10-27 DIAGNOSIS — E1165 Type 2 diabetes mellitus with hyperglycemia: Secondary | ICD-10-CM | POA: Insufficient documentation

## 2018-10-27 DIAGNOSIS — N183 Chronic kidney disease, stage 3 (moderate): Secondary | ICD-10-CM | POA: Diagnosis not present

## 2018-10-27 DIAGNOSIS — Z79899 Other long term (current) drug therapy: Secondary | ICD-10-CM | POA: Insufficient documentation

## 2018-10-27 DIAGNOSIS — R5381 Other malaise: Secondary | ICD-10-CM | POA: Insufficient documentation

## 2018-10-27 DIAGNOSIS — H409 Unspecified glaucoma: Secondary | ICD-10-CM | POA: Diagnosis not present

## 2018-10-27 DIAGNOSIS — I130 Hypertensive heart and chronic kidney disease with heart failure and stage 1 through stage 4 chronic kidney disease, or unspecified chronic kidney disease: Secondary | ICD-10-CM | POA: Insufficient documentation

## 2018-10-27 DIAGNOSIS — E039 Hypothyroidism, unspecified: Secondary | ICD-10-CM | POA: Diagnosis not present

## 2018-10-27 DIAGNOSIS — Z823 Family history of stroke: Secondary | ICD-10-CM | POA: Insufficient documentation

## 2018-10-27 DIAGNOSIS — M109 Gout, unspecified: Secondary | ICD-10-CM | POA: Insufficient documentation

## 2018-10-27 DIAGNOSIS — K219 Gastro-esophageal reflux disease without esophagitis: Secondary | ICD-10-CM | POA: Diagnosis not present

## 2018-10-27 DIAGNOSIS — I10 Essential (primary) hypertension: Secondary | ICD-10-CM

## 2018-10-27 DIAGNOSIS — M1990 Unspecified osteoarthritis, unspecified site: Secondary | ICD-10-CM | POA: Insufficient documentation

## 2018-10-27 DIAGNOSIS — Z7982 Long term (current) use of aspirin: Secondary | ICD-10-CM | POA: Insufficient documentation

## 2018-10-27 DIAGNOSIS — I890 Lymphedema, not elsewhere classified: Secondary | ICD-10-CM | POA: Diagnosis not present

## 2018-10-27 NOTE — Progress Notes (Signed)
[facility] - PHARMACIST COUNSELING NOTE  ADHERENCE ASSESSMENT  Adherence strategy: lives at [place]. Administers medications for her   Do you ever forget to take your medication? [] Yes (1) [x] No (0)  Do you ever skip doses due to side effects? [] Yes (1) [x] No (0)  Do you have trouble affording your medicines? [] Yes (1) [x] No (0)  Are you ever unable to pick up your medication due to transportation difficulties? [] Yes (1) [x] No (0)  Do you ever stop taking your medications because you don't believe they are helping? [] Yes (1) [x] No (0)  Total score _0______    Recommendations given to patient about increasing adherence: None. Facility administers medications for her.  Guideline-Directed Medical Therapy/Evidence Based Medicine    ACE/ARB/ARNI: none (HFpEF)   Beta Blocker: metoprolol tartrate 50 mg twice daily (HFpEF)   Aldosterone Antagonist: none (HFpEF) Diuretic: furosemide 40 mg daily    SUBJECTIVE   HPI: Here for initial visit after recent hospitalization. Endorses SOB and swelling. Also reports occasional light-headedness.  Past Medical History:  Diagnosis Date  . Abnormal glucose   . Anxiety   . Arthritis   . Asthma   . Calculus of gallbladder without mention of cholecystitis, with obstruction   . Cancer ([place])    kidney  . Candidiasis of the esophagus   . Cataract cortical, senile   . CHF (congestive heart failure) ([place])   . Chronic kidney disease, stage III (moderate) (HCC)   . Chronic pain   . Chronic pain syndrome   . Corns and callosities   . Debility   . Depression   . Dermatophytosis of nail   . Diabetes mellitus without complication ([place])    unspecified, without mention of complication  . Difficult intubation   . Dysphagia   . Dyspnea    with exertion  . Edema, unspecified   . Emphysema lung ([place])   . Emphysema of lung ([place])   . Esophageal reflux   . Essential hypertension, benign   . Generalized  atherosclerosis   . Glaucoma (increased eye pressure)   . Gout, unspecified   . Hemiplegia ([place])   . Hereditary and idiopathic peripheral neuropathy   . Hyperglycemia   . Hyperlipidemia   . Hypertension   . Hypoglycemia, unspecified   . Hypopotassemia   . Hypothyroidism   . Migraine, unspecified, without mention of intractable migraine without mention of status migrainosus   . Other vitamin B12 deficiency anemia   . Pain in limb   . Peripheral vascular disease ([place])   . Personal history of fall   . Stroke ([place])   . Thyroid disease   . Ulcer of foot ([place])   . Unspecified asthma(493.90)   . Unspecified constipation   . Unspecified hypertensive heart disease with heart failure(402.91)   . Unspecified urinary incontinence   . Unspecified vitamin D deficiency         OBJECTIVE    Vital signs: HR 89, BP 125/63  ECHO: Date 10/10/18, EF 50-55%    BMP Latest Ref Rng & Units 10/13/2018 10/12/2018 10/11/2018  Glucose 70 - 99 mg/dL 83 94 74  BUN 8 - 23 mg/dL 25(H) 22 20  Creatinine 0.44 - 1.00 mg/dL 1.62(H) 1.58(H) 1.56(H)  Sodium 135 - 145 mmol/L 141 140 141  Potassium 3.5 - 5.1 mmol/L 4.0 3.7 3.7  Chloride 98 - 111 mmol/L 107 105 107  CO2 22 - 32 mmol/L 26 26 24   Calcium 8.9 - 10.3 mg/dL 8.3(L) 8.6(L)  8.6(L)    ASSESSMENT 85 year old female with HFpEF. She endorses SOB and swelling. Also reports occasional light-headedness. She has been prescribed 3 days worth of metolazone to take 3/11, 3/13 and 3/16 in addition to her daily Lasix. Metoprolol increased to 50 mg twice daily. Denies any medication concerns. BP at visit today well controlled.   PLAN Continue current medication regimen. Patient to have compression socks placed at facility to help with swelling. She will follow up with HF Clinic in 1-2 months.    Time spent: 10 minutes  [place], Pharm.D. 10/27/2018 9:13 AM    Current Outpatient Medications:  .  acetaminophen (TYLENOL) 325 MG tablet, Take 650 mg by  mouth every 4 (four) hours as needed for mild pain. , Disp: , Rfl:  .  allopurinol (ZYLOPRIM) 100 MG tablet, Take 100 mg by mouth daily., Disp: , Rfl:  .  amitriptyline (ELAVIL) 25 MG tablet, Take 12.5 mg by mouth 2 (two) times daily. , Disp: , Rfl:  .  aspirin EC 81 MG EC tablet, Take 1 tablet (81 mg total) by mouth daily., Disp: 30 tablet, Rfl: 0 .  atorvastatin (LIPITOR) 20 MG tablet, Take 20 mg by mouth daily., Disp: , Rfl:  .  Cholecalciferol (VITAMIN D-3) 1000 UNITS CAPS, Take 2,000 Units by mouth daily. , Disp: , Rfl:  .  clopidogrel (PLAVIX) 75 MG tablet, Take 1 tablet (75 mg total) by mouth daily., Disp: 30 tablet, Rfl: 4 .  DULoxetine (CYMBALTA) 30 MG capsule, Take 30 mg by mouth every morning., Disp: , Rfl:  .  furosemide (LASIX) 40 MG tablet, Take 40 mg by mouth daily., Disp: , Rfl:  .  gabapentin (NEURONTIN) 400 MG capsule, Take 400 mg by mouth 2 (two) times daily. , Disp: , Rfl:  .  latanoprost (XALATAN) 0.005 % ophthalmic solution, Place 1 drop into both eyes at bedtime., Disp: , Rfl:  .  levothyroxine (SYNTHROID, LEVOTHROID) 25 MCG tablet, Take 25 mcg by mouth daily before breakfast., Disp: , Rfl:  .  metolazone (ZAROXOLYN) 2.5 MG tablet, Take 2.5 mg by mouth daily. Give on 3/11, 3/13 and 3/16 30 min before Lasix, Disp: , Rfl:  .  metoprolol tartrate (LOPRESSOR) 25 MG tablet, Take 50 mg by mouth 2 (two) times daily. , Disp: , Rfl:  .  ondansetron (ZOFRAN) 4 MG tablet, Take 4 mg by mouth every 8 (eight) hours as needed for nausea or vomiting., Disp: , Rfl:  .  pantoprazole (PROTONIX) 40 MG tablet, Take 40 mg by mouth daily. , Disp: , Rfl:  .  polyethylene glycol (MIRALAX / GLYCOLAX) packet, Take 17 g by mouth daily as needed for mild constipation or moderate constipation., Disp: , Rfl:  .  potassium chloride (K-DUR,KLOR-CON) 10 MEQ tablet, Take 10 mEq by mouth daily., Disp: , Rfl:  .  senna (SENOKOT) 8.6 MG tablet, Take 2 tablets by mouth 2 (two) times daily. , Disp: , Rfl:  .   topiramate (TOPAMAX) 25 MG tablet, Take 25 mg by mouth at bedtime., Disp: , Rfl:  .  traMADol (ULTRAM) 50 MG tablet, Take 1 tablet (50 mg total) by mouth every 8 (eight) hours as needed (for pain control)., Disp: 20 tablet, Rfl: 0   COUNSELING POINTS/CLINICAL PEARLS  Furosemide  Drug causes sun-sensitivity. Advise patient to use sunscreen and avoid tanning beds. Patient should avoid activities requiring coordination until drug effects are realized, as drug may cause dizziness, vertigo, or blurred vision. This drug may cause hyperglycemia,  hyperuricemia, constipation, diarrhea, loss of appetite, nausea, vomiting, purpuric disorder, cramps, spasticity, asthenia, headache, paresthesia, or scaling eczema. Instruct patient to report unusual bleeding/bruising or signs/symptoms of hypotension, infection, pancreatitis, or ototoxicity (tinnitus, hearing impairment). Advise patient to report signs/symptoms of a severe skin reactions (flu-like symptoms, spreading red rash, or skin/mucous membrane blistering) or erythema multiforme. Instruct patient to eat high-potassium foods during drug therapy, as directed by healthcare professional.  Patient should not drink alcohol while taking this drug.   DRUGS TO AVOID IN HEART FAILURE  Drug or Class Mechanism  Analgesics . NSAIDs . COX-2 inhibitors . Glucocorticoids  Sodium and water retention, increased systemic vascular resistance, decreased response to diuretics   Diabetes Medications . Metformin . Thiazolidinediones o Rosiglitazone (Avandia) o Pioglitazone (Actos) . DPP4 Inhibitors o Saxagliptin (Onglyza) o Sitagliptin (Januvia)   Lactic acidosis Possible calcium channel blockade   Unknown  Antiarrhythmics . Class I  o Flecainide o Disopyramide . Class III o Sotalol . Other o Dronedarone  Negative inotrope, proarrhythmic   Proarrhythmic, beta blockade  Negative inotrope  Antihypertensives . Alpha Blockers o Doxazosin . Calcium  Channel Blockers o Diltiazem o Verapamil o Nifedipine . Central Alpha Adrenergics o Moxonidine . Peripheral Vasodilators o Minoxidil  Increases renin and aldosterone  Negative inotrope    Possible sympathetic withdrawal  Unknown  Anti-infective . Itraconazole . Amphotericin B  Negative inotrope Unknown  Hematologic . Anagrelide . Cilostazol   Possible inhibition of PD IV Inhibition of PD III causing arrhythmias  Neurologic/Psychiatric . Stimulants . Anti-Seizure Drugs o Carbamazepine o Pregabalin . Antidepressants o Tricyclics o Citalopram . Parkinsons o Bromocriptine o Pergolide o Pramipexole . Antipsychotics o Clozapine . Antimigraine o Ergotamine o Methysergide . Appetite suppressants . Bipolar o Lithium  Peripheral alpha and beta agonist activity  Negative inotrope and chronotrope Calcium channel blockade  Negative inotrope, proarrhythmic Dose-dependent QT prolongation  Excessive serotonin activity/valvular damage Excessive serotonin activity/valvular damage Unknown  IgE mediated hypersensitivy, calcium channel blockade  Excessive serotonin activity/valvular damage Excessive serotonin activity/valvular damage Valvular damage  Direct myofibrillar degeneration, adrenergic stimulation  Antimalarials . Chloroquine . Hydroxychloroquine Intracellular inhibition of lysosomal enzymes  Urologic Agents . Alpha Blockers o Doxazosin o Prazosin o Tamsulosin o Terazosin  Increased renin and aldosterone  Adapted from Page RL, et al. "Drugs That May Cause or Exacerbate Heart Failure: A Scientific Statement from the [place]." Circulation 2016; 562:B63-S93. DOI: 10.1161/CIR.0000000000000426   MEDICATION ADHERENCES TIPS AND STRATEGIES 1. Taking medication as prescribed improves patient outcomes in heart failure (reduces hospitalizations, improves symptoms, increases survival) 2. Side effects of medications can be managed by  decreasing doses, switching agents, stopping drugs, or adding additional therapy. Please let someone in the [facility] know if you have having bothersome side effects so we can modify your regimen. Do not alter your medication regimen without talking to [place].  3. Medication reminders can help patients remember to take drugs on time. If you are missing or forgetting doses you can try linking behaviors, using pill boxes, or an electronic reminder like an alarm on your phone or an app. Some people can also get automated phone calls as medication reminders.

## 2018-10-27 NOTE — Patient Instructions (Addendum)
Begin weighing daily and call for an overnight weight gain of > 2 pounds or a weekly weight gain of >5 pounds. 

## 2018-10-30 IMAGING — US US CAROTID DUPLEX BILAT
1 series · 13 of 24 positions shown · non-contrast
Comparison: MRI 07/21/2016.

CLINICAL DATA: Stroke.

EXAM:
BILATERAL CAROTID DUPLEX ULTRASOUND
TECHNIQUE: Gray scale imaging, color Doppler and duplex ultrasound were
performed of bilateral carotid and vertebral arteries in the neck.

[Series 1: us carotid duplex bilat · 0.07mm/px · 13 of 68 slices shown]
[im 1/68]
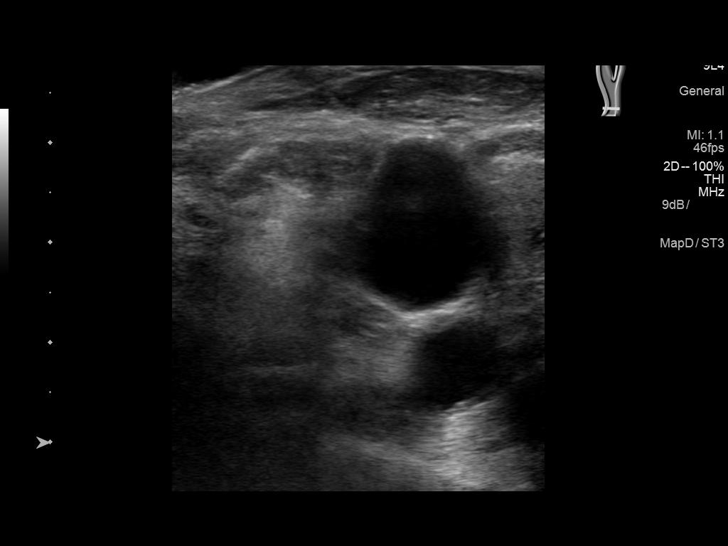
[im 6/68]
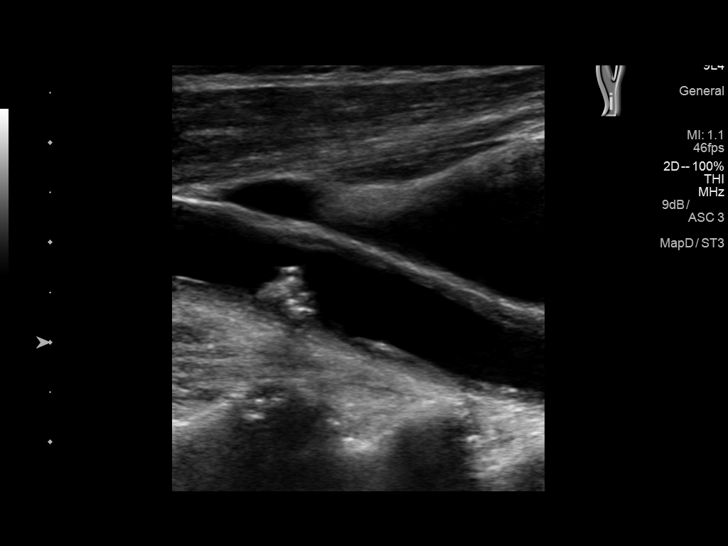
[im 12/68]
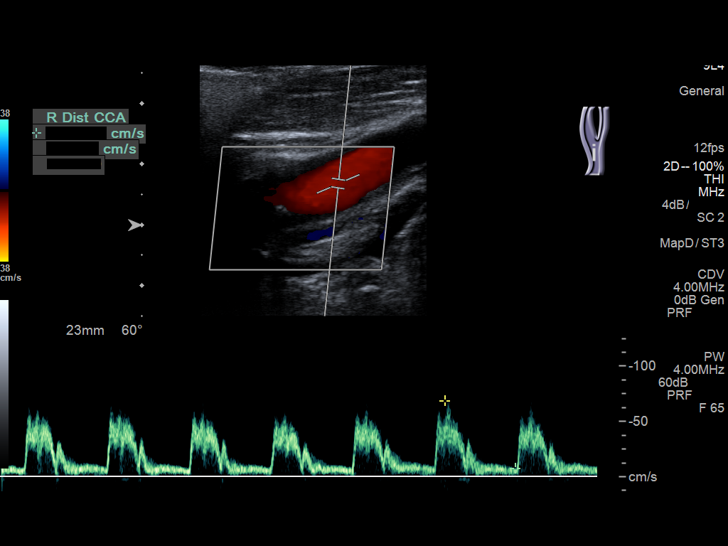
[im 18/68]
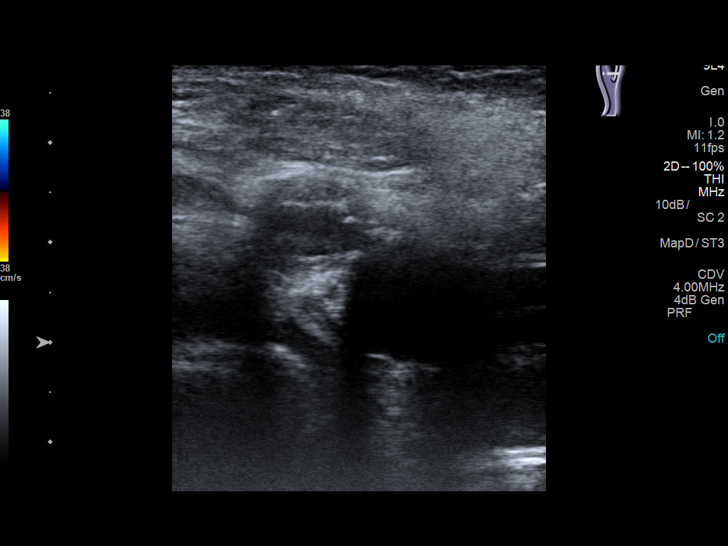
[im 24/68]
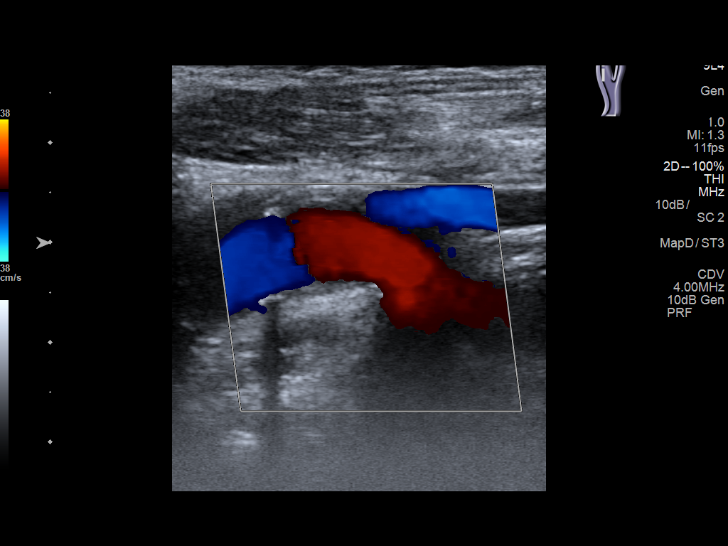
[im 30/68]
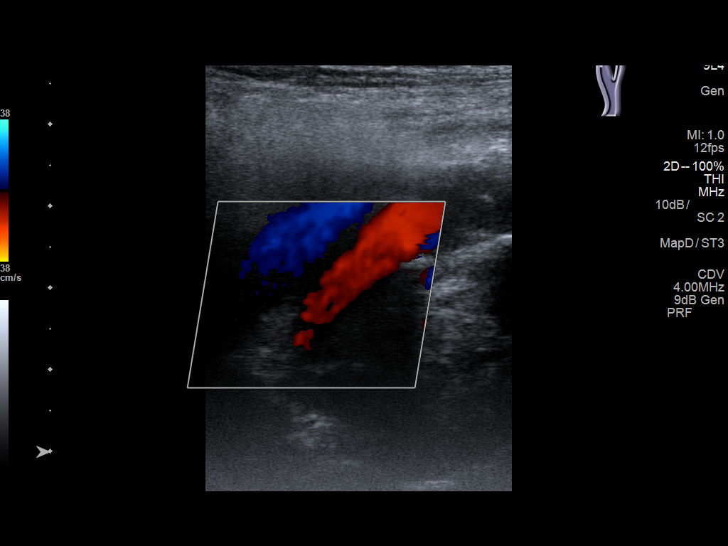
[im 35/68]
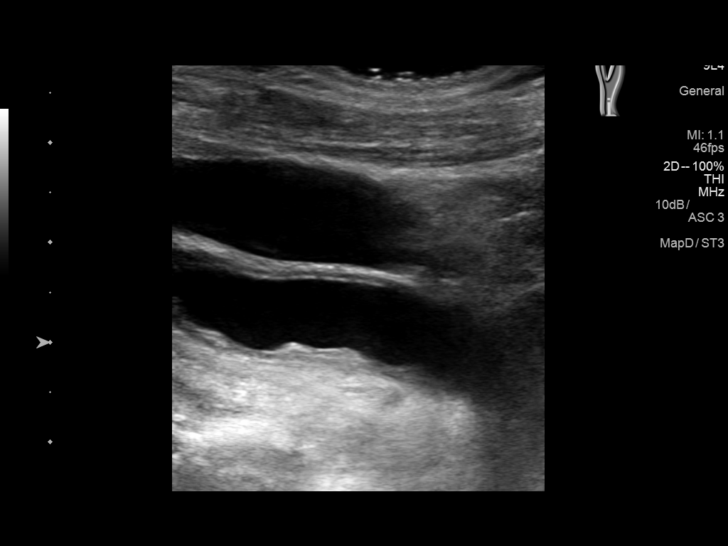
[im 38/68]
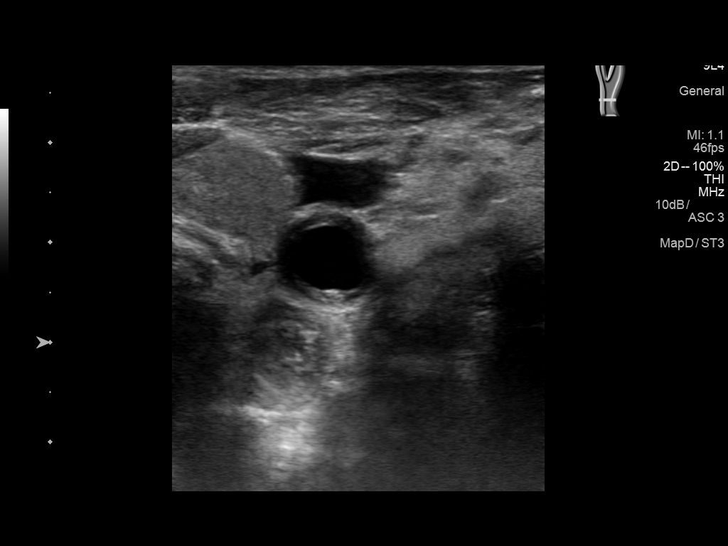
[im 44/68]
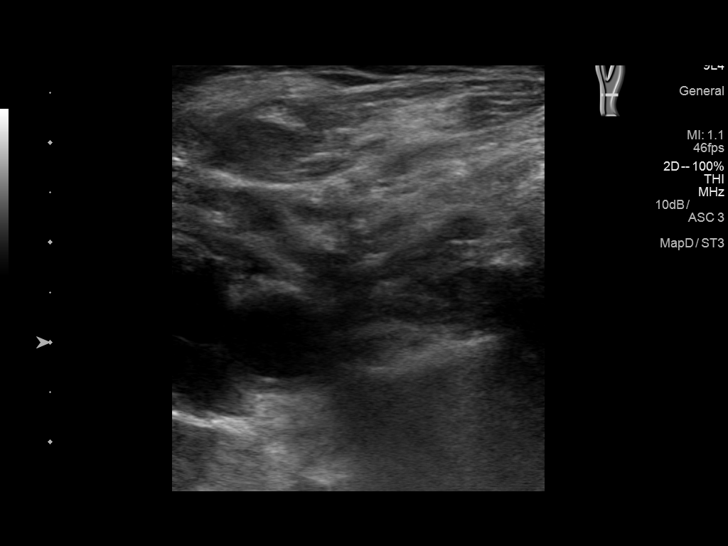
[im 50/68]
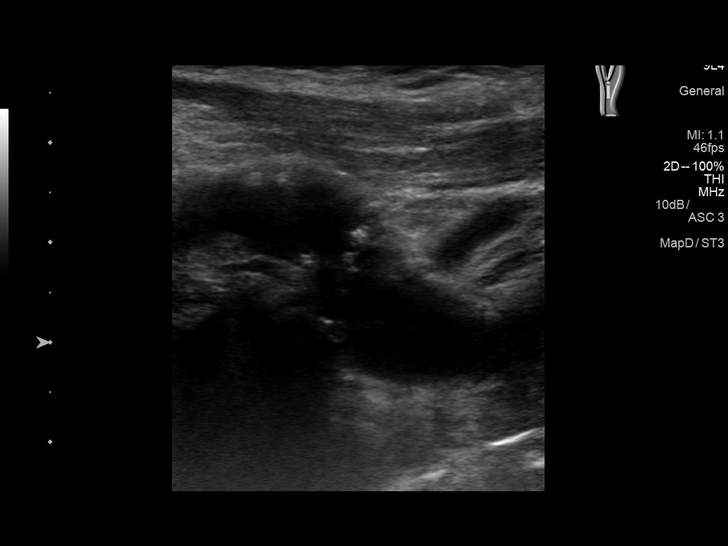
[im 56/68]
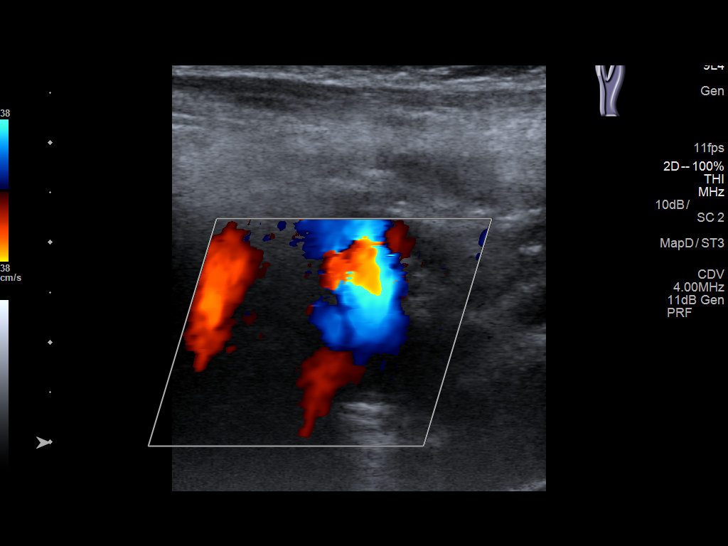
[im 62/68]
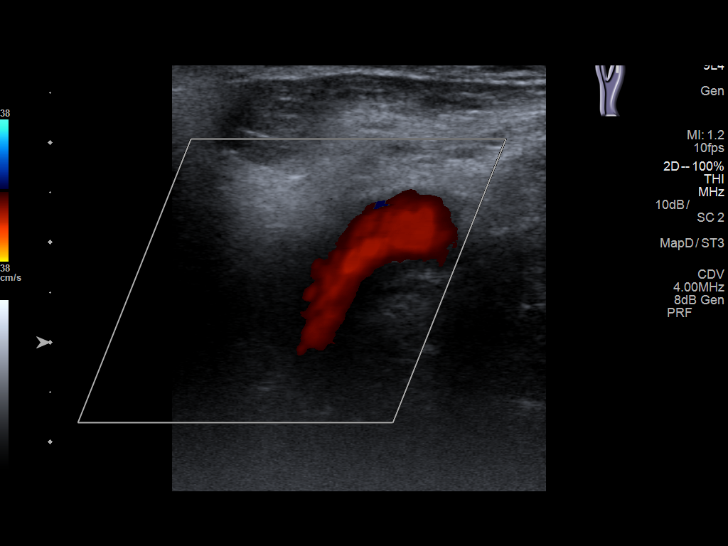
[im 68/68]
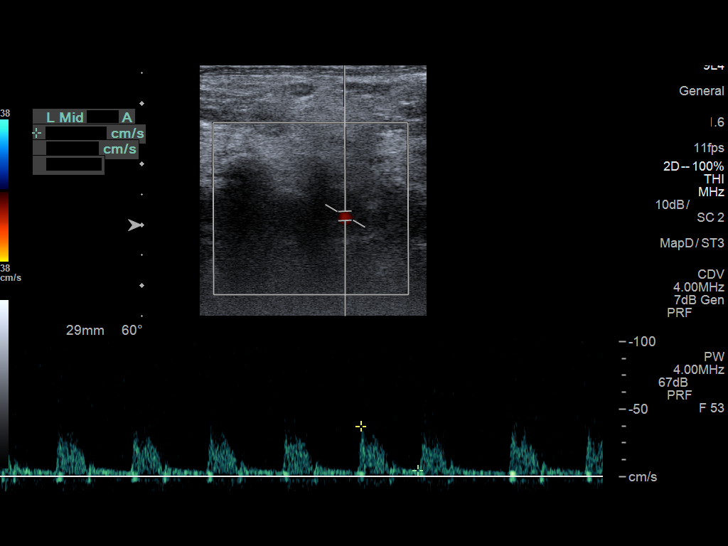

[13 of 24 positions shown; findings below may reference images not displayed]

FINDINGS: Criteria: Quantification of carotid stenosis is based on velocity
parameters that correlate the residual internal carotid diameter
with NASCET-based stenosis levels, using the diameter of the distal
internal carotid lumen as the denominator for stenosis measurement.

The following velocity measurements were obtained:

RIGHT

ICA:  80/13 cm/sec

CCA:  71/10 cm/sec

SYSTOLIC ICA/CCA RATIO:

DIASTOLIC ICA/CCA RATIO:

ECA:  6 cm/sec

LEFT

ICA:  51/15 cm/sec

CCA:  91/8 cm/sec

SYSTOLIC ICA/CCA RATIO:

DIASTOLIC ICA/CCA RATIO:

ECA:  21 cm/sec

RIGHT CAROTID ARTERY: Diffuse right common carotid, carotid
bifurcation, and proximal ICA atherosclerotic vascular plaque. No
flow limiting stenosis. Degree of stenosis less than 50%.

RIGHT VERTEBRAL ARTERY:  Patent with antegrade flow.

LEFT CAROTID ARTERY: Mild diffuse left common carotid, carotid
bifurcation, proximal ICA atherosclerotic vascular plaque. No flow
limiting stenosis. Degree of stenosis less than 50% .

LEFT VERTEBRAL ARTERY:  Patent antegrade flow .
IMPRESSION: 1. Mild bilateral diffuse carotid atherosclerotic vascular disease.
No flow limiting stenosis. Degree of stenosis less than 50%
bilaterally.

2. Vertebral artery patent antegrade flow.

## 2018-11-25 ENCOUNTER — Other Ambulatory Visit (INDEPENDENT_AMBULATORY_CARE_PROVIDER_SITE_OTHER): Payer: Self-pay | Admitting: Nurse Practitioner

## 2018-11-25 DIAGNOSIS — I739 Peripheral vascular disease, unspecified: Secondary | ICD-10-CM

## 2018-12-02 ENCOUNTER — Other Ambulatory Visit: Payer: Self-pay

## 2018-12-02 ENCOUNTER — Ambulatory Visit (INDEPENDENT_AMBULATORY_CARE_PROVIDER_SITE_OTHER): Payer: Medicare Other

## 2018-12-02 ENCOUNTER — Encounter (INDEPENDENT_AMBULATORY_CARE_PROVIDER_SITE_OTHER): Payer: Self-pay | Admitting: Nurse Practitioner

## 2018-12-02 ENCOUNTER — Ambulatory Visit (INDEPENDENT_AMBULATORY_CARE_PROVIDER_SITE_OTHER): Payer: Medicare Other | Admitting: Nurse Practitioner

## 2018-12-02 VITALS — BP 109/66 | HR 71 | Resp 16

## 2018-12-02 DIAGNOSIS — I739 Peripheral vascular disease, unspecified: Secondary | ICD-10-CM | POA: Diagnosis not present

## 2018-12-02 DIAGNOSIS — E1122 Type 2 diabetes mellitus with diabetic chronic kidney disease: Secondary | ICD-10-CM | POA: Diagnosis not present

## 2018-12-02 DIAGNOSIS — E782 Mixed hyperlipidemia: Secondary | ICD-10-CM | POA: Diagnosis not present

## 2018-12-02 DIAGNOSIS — I890 Lymphedema, not elsewhere classified: Secondary | ICD-10-CM | POA: Diagnosis not present

## 2018-12-02 DIAGNOSIS — N183 Chronic kidney disease, stage 3 (moderate): Secondary | ICD-10-CM

## 2018-12-02 DIAGNOSIS — Z79899 Other long term (current) drug therapy: Secondary | ICD-10-CM

## 2018-12-02 NOTE — Progress Notes (Signed)
SUBJECTIVE:  Patient ID: [name], female    DOB: 07-25-1928, 85 y.o.   MRN: [id number] No chief complaint on file.   HPI  [name] is a 85 y.o. female returns today for 23-month follow-up of her peripheral arterial disease.  [name] currently resides in a nursing facility and she is not a tobacco user.  Since her last visit, the patient has wounds on both lower extremities.  She has wounds on the toes of both feet as well as the heels.  She states that she has constant pain in her toes as well.  This is a stark change from her last visit where she had no issues.  Patient denies any fever, chills, nausea, vomiting and diarrhea.  She denies any chest pain or shortness of breath.  She denies any TIA-like symptoms.  The patient underwent noninvasive studies today.  The ABI on her right lower extremity is 0.58, her left lower extremity is 0.78.  Previous ABIs done on 09/02/2018 were 0.69 on her right and 0.87 on her left.  The patient also has monophasic tibial artery waveforms bilaterally.  The right digit waveforms are dampened compared with the left. Past Medical History:  Diagnosis Date  . Abnormal glucose   . Anxiety   . Arthritis   . Asthma   . Calculus of gallbladder without mention of cholecystitis, with obstruction   . Cancer ([place])    kidney  . Candidiasis of the esophagus   . Cataract cortical, senile   . CHF (congestive heart failure) ([place])   . Chronic kidney disease, stage III (moderate) (HCC)   . Chronic pain   . Chronic pain syndrome   . Corns and callosities   . Debility   . Depression   . Dermatophytosis of nail   . Diabetes mellitus without complication ([place])    unspecified, without mention of complication  . Difficult intubation   . Dysphagia   . Dyspnea    with exertion  . Edema, unspecified   . Emphysema lung ([place])   . Emphysema of lung ([place])   . Esophageal reflux   . Essential hypertension, benign   . Generalized atherosclerosis   .  Glaucoma (increased eye pressure)   . Gout, unspecified   . Hemiplegia ([place])   . Hereditary and idiopathic peripheral neuropathy   . Hyperglycemia   . Hyperlipidemia   . Hypertension   . Hypoglycemia, unspecified   . Hypopotassemia   . Hypothyroidism   . Migraine, unspecified, without mention of intractable migraine without mention of status migrainosus   . Other vitamin B12 deficiency anemia   . Pain in limb   . Peripheral vascular disease ([place])   . Personal history of fall   . Stroke ([place])   . Thyroid disease   . Ulcer of foot ([place])   . Unspecified asthma(493.90)   . Unspecified constipation   . Unspecified hypertensive heart disease with heart failure(402.91)   . Unspecified urinary incontinence   . Unspecified vitamin D deficiency     Past Surgical History:  Procedure Laterality Date  . ABDOMINAL HYSTERECTOMY    . arterial thrombi stents in both legs    . BACK SURGERY    . BACK SURGERY    . CATARACT EXTRACTION    . COLONOSCOPY    . ESOPHAGOGASTRODUODENOSCOPY (EGD) WITH PROPOFOL N/A 06/19/2016   Procedure: ESOPHAGOGASTRODUODENOSCOPY (EGD) WITH PROPOFOL;  Surgeon: Lollie Sails, MD;  Location: [facility] ENDOSCOPY;  Service: Endoscopy;  Laterality: N/A;  .  ESOPHAGOGASTRODUODENOSCOPY (EGD) WITH PROPOFOL N/A 04/10/2017   Procedure: ESOPHAGOGASTRODUODENOSCOPY (EGD) WITH PROPOFOL;  Surgeon: Lollie Sails, MD;  Location[facility] ENDOSCOPY;  Service: Endoscopy;  Laterality: N/A;  . EYE SURGERY     cataract extraction  . LOWER EXTREMITY ANGIOGRAPHY Right 10/07/2016   Procedure: Lower Extremity Angiography;  Surgeon: Katha Cabal, MD;  Location: [place] CV LAB;  Service: Cardiovascular;  Laterality: Right;  . LOWER EXTREMITY ANGIOGRAPHY Right 02/23/2018   Procedure: LOWER EXTREMITY ANGIOGRAPHY;  Surgeon: Katha Cabal, MD;  Location: [place] CV LAB;  Service: Cardiovascular;  Laterality: Right;  . LOWER EXTREMITY ANGIOGRAPHY Left 05/18/2018   Procedure: LOWER  EXTREMITY ANGIOGRAPHY;  Surgeon: Katha Cabal, MD;  Location: [place] CV LAB;  Service: Cardiovascular;  Laterality: Left;  . PERIPHERAL VASCULAR CATHETERIZATION Left 09/02/2016   Procedure: Lower Extremity Angiography;  Surgeon: Katha Cabal, MD;  Location: [place] CV LAB;  Service: Cardiovascular;  Laterality: Left;  . PERIPHERAL VASCULAR CATHETERIZATION Right 09/16/2016   Procedure: Lower Extremity Angiography;  Surgeon: Katha Cabal, MD;  Location: [place] [facility] LAB;  Service: Cardiovascular;  Laterality: Right;    Social History   Socioeconomic History  . Marital status: Single    Spouse name: Not on file  . Number of children: Not on file  . Years of education: Not on file  . Highest education level: Not on file  Occupational History  . Occupation: retired  Scientific laboratory technician  . Financial resource strain: Not on file  . Food insecurity:    Worry: Not on file    Inability: Not on file  . Transportation needs:    Medical: Not on file    Non-medical: Not on file  Tobacco Use  . Smoking status: Never Smoker  . Smokeless tobacco: Never Used  Substance and Sexual Activity  . Alcohol use: No  . Drug use: No  . Sexual activity: Not Currently  Lifestyle  . Physical activity:    Days per week: Not on file    Minutes per session: Not on file  . Stress: Not on file  Relationships  . Social connections:    Talks on phone: Not on file    Gets together: Not on file    Attends religious service: Not on file    Active member of club or organization: Not on file    Attends meetings of clubs or organizations: Not on file    Relationship status: Not on file  . Intimate partner violence:    Fear of current or ex partner: Not on file    Emotionally abused: Not on file    Physically abused: Not on file    Forced sexual activity: Not on file  Other Topics Concern  . Not on file  Social History Narrative  . Not on file    Family History  Problem Relation  Age of Onset  . Cancer Father   . Stroke Father   . Seizures Mother   . Lung cancer Sister   . Bone cancer Sister     No Known Allergies   Review of Systems   Review of Systems: Negative Unless Checked Constitutional: [] Weight loss  [] Fever  [] Chills Cardiac: [] Chest pain   []  Atrial Fibrillation  [] Palpitations   [] Shortness of breath when laying flat   [] Shortness of breath with exertion. [] Shortness of breath at rest Vascular:  [] Pain in legs with walking   [] Pain in legs with standing [] Pain in legs when laying flat   []   Claudication    [x] Pain in feet when laying flat    [] History of DVT   [] Phlebitis   [x] Swelling in legs   [] Varicose veins   [x] Non-healing ulcers Pulmonary:   [] Uses home oxygen   [] Productive cough   [] Hemoptysis   [] Wheeze  [] COPD   [x] Asthma Neurologic:  [] Dizziness   [] Seizures  [] Blackouts [] History of stroke   [] History of TIA  [] Aphasia   [] Temporary Blindness   [] Weakness or numbness in arm   [x] Weakness or numbness in leg Musculoskeletal:   [] Joint swelling   [] Joint pain   [] Low back pain  []  History of Knee Replacement [x] Arthritis [] back Surgeries  []  Spinal Stenosis    Hematologic:  [] Easy bruising  [] Easy bleeding   [] Hypercoagulable state   [x] Anemic Gastrointestinal:  [] Diarrhea   [] Vomiting  [x] Gastroesophageal reflux/heartburn   [] Difficulty swallowing. [] Abdominal pain Genitourinary:  [x] Chronic kidney disease   [] Difficult urination  [] Anuric   [] Blood in urine [] Frequent urination  [] Burning with urination   [] Hematuria Skin:  [] Rashes   [x] Ulcers [] Wounds Psychological:  [] History of anxiety   []  History of major depression  []  Memory Difficulties      OBJECTIVE:   Physical Exam  BP 109/66 (BP Location: Right Arm)   Pulse 71   Resp 16   LMP  (LMP Unknown)   Gen: WD/WN, NAD Head: [place]/AT, No temporalis wasting.  Ear/Nose/Throat: Hearing grossly intact, nares w/o erythema or drainage Eyes: PER, EOMI, sclera nonicteric.  Neck: Supple, no  masses.  No JVD.  Pulmonary:  Good air movement, no use of accessory muscles.  Cardiac: RRR Vascular:  Vessel Right Left  Radial Palpable Palpable  Dorsalis Pedis Not Palpable Not Palpable  Posterior Tibial Not Palpable Not Palpable   Gastrointestinal: soft, non-distended. No guarding/no peritoneal signs.  Musculoskeletal: M/S 5/5 throughout.  No deformity or atrophy.  Neurologic: Pain and light touch intact in extremities.  Symmetrical.  Speech is fluent. Motor exam as listed above. Psychiatric: Judgment intact, Mood & affect appropriate for pt's clinical situation. Dermatologic:  Exudative wounds on the toes of both lower extremities.  Wounds present on heels as well. No changes consistent with cellulitis. Lymph : No Cervical lymphadenopathy, no lichenification or skin changes of chronic lymphedema.       ASSESSMENT AND PLAN:  1. PVD (peripheral vascular disease) ([place])  Recommend:  The patient has evidence of severe atherosclerotic changes of both lower extremities associated with ulceration and tissue loss of the foot.  This represents a limb threatening ischemia and places the patient at the risk for limb loss.  Patient should undergo angiography of the lower extremities with the hope for intervention for limb salvage.  The risks and benefits as well as the alternative therapies was discussed in detail with the patient.  All questions were answered.  Patient agrees to proceed with angiography.  We will proceed with the right lower extremity first, followed by the left.  The patient will follow up with me in the office after the procedure.    2. Mixed hyperlipidemia Continue statin as ordered and reviewed, no changes at this time   3. Lymphedema Currently the patient's swelling is doing well.  The patient is wearing medical grade 1 compression stockings at this time.  The patient does not ambulate very much so continue elevation of the lower extremities as tolerated is advised.   4. Type 2 diabetes mellitus with stage 3 chronic kidney disease, without long-term current use of insulin (HCC) Continue hypoglycemic medications as  already ordered, these medications have been reviewed and there are no changes at this time.  Hgb A1C to be monitored as already arranged by primary service    Current Outpatient Medications on File Prior to Visit  Medication Sig Dispense Refill  . acetaminophen (TYLENOL) 325 MG tablet Take 650 mg by mouth every 4 (four) hours as needed for mild pain.     Marland Kitchen allopurinol (ZYLOPRIM) 100 MG tablet Take 100 mg by mouth daily.    . Amino Acids-Protein Hydrolys (FEEDING SUPPLEMENT, PRO-STAT SUGAR FREE 64,) LIQD Take 30 mLs by mouth at bedtime.    Marland Kitchen amitriptyline (ELAVIL) 25 MG tablet Take 12.5 mg by mouth 2 (two) times daily.     Marland Kitchen aspirin EC 81 MG EC tablet Take 1 tablet (81 mg total) by mouth daily. 30 tablet 0  . atorvastatin (LIPITOR) 20 MG tablet Take 20 mg by mouth daily.    . Cholecalciferol (VITAMIN D-3) 1000 UNITS CAPS Take 2,000 Units by mouth daily.     . clopidogrel (PLAVIX) 75 MG tablet Take 1 tablet (75 mg total) by mouth daily. 30 tablet 4  . DULoxetine (CYMBALTA) 30 MG capsule Take 30 mg by mouth every morning.    . furosemide (LASIX) 40 MG tablet Take 40 mg by mouth daily.    Marland Kitchen gabapentin (NEURONTIN) 400 MG capsule Take 400 mg by mouth 2 (two) times daily.     Marland Kitchen latanoprost (XALATAN) 0.005 % ophthalmic solution Place 1 drop into both eyes at bedtime.    Marland Kitchen levothyroxine (SYNTHROID, LEVOTHROID) 25 MCG tablet Take 25 mcg by mouth daily before breakfast.    . metolazone (ZAROXOLYN) 2.5 MG tablet Take 2.5 mg by mouth daily. Give on 3/11, 3/13 and 3/16 30 min before Lasix    . metoprolol tartrate (LOPRESSOR) 25 MG tablet Take 50 mg by mouth 2 (two) times daily.     . ondansetron (ZOFRAN) 4 MG tablet Take 4 mg by mouth every 8 (eight) hours as needed for nausea or vomiting.    . pantoprazole (PROTONIX) 40 MG tablet Take 40 mg by mouth  daily.     . polyethylene glycol (MIRALAX / GLYCOLAX) packet Take 17 g by mouth daily as needed for mild constipation or moderate constipation.    . potassium chloride (K-DUR,KLOR-CON) 10 MEQ tablet Take 10 mEq by mouth daily.    Marland Kitchen senna (SENOKOT) 8.6 MG tablet Take 2 tablets by mouth 2 (two) times daily.     Marland Kitchen topiramate (TOPAMAX) 25 MG tablet Take 25 mg by mouth at bedtime.    . traMADol (ULTRAM) 50 MG tablet Take 1 tablet (50 mg total) by mouth every 8 (eight) hours as needed (for pain control). 20 tablet 0   No current facility-administered medications on file prior to visit.     There are no Patient Instructions on file for this visit. No follow-ups on file.   Kris Hartmann, NP  This note was completed with Sales executive.  Any errors are purely unintentional.

## 2018-12-06 ENCOUNTER — Other Ambulatory Visit (INDEPENDENT_AMBULATORY_CARE_PROVIDER_SITE_OTHER): Payer: Self-pay | Admitting: Nurse Practitioner

## 2018-12-06 MED ORDER — CEFAZOLIN SODIUM-DEXTROSE 2-4 GM/100ML-% IV SOLN: 2.0000 g | Freq: Once | INTRAVENOUS | Status: DC

## 2018-12-07 ENCOUNTER — Ambulatory Visit: Payer: Medicare Other

## 2018-12-07 ENCOUNTER — Encounter
Admission: AD | Disposition: A | Discharge status: Skilled Nursing Facility | Payer: Self-pay | Source: Home / Self Care | Attending: Internal Medicine

## 2018-12-07 ENCOUNTER — Other Ambulatory Visit: Payer: Self-pay

## 2018-12-07 ENCOUNTER — Inpatient Hospital Stay
Admission: AD | Admit: 2018-12-07 | Discharge: 2018-12-11 | DRG: 252 | Disposition: A | Discharge status: Skilled Nursing Facility | Payer: Medicare Other | Attending: Internal Medicine | Admitting: Internal Medicine

## 2018-12-07 DIAGNOSIS — Z7982 Long term (current) use of aspirin: Secondary | ICD-10-CM

## 2018-12-07 DIAGNOSIS — E785 Hyperlipidemia, unspecified: Secondary | ICD-10-CM | POA: Diagnosis present

## 2018-12-07 DIAGNOSIS — G629 Polyneuropathy, unspecified: Secondary | ICD-10-CM | POA: Diagnosis present

## 2018-12-07 DIAGNOSIS — L97919 Non-pressure chronic ulcer of unspecified part of right lower leg with unspecified severity: Secondary | ICD-10-CM | POA: Diagnosis not present

## 2018-12-07 DIAGNOSIS — E1122 Type 2 diabetes mellitus with diabetic chronic kidney disease: Secondary | ICD-10-CM | POA: Diagnosis present

## 2018-12-07 DIAGNOSIS — F0150 Vascular dementia without behavioral disturbance: Secondary | ICD-10-CM | POA: Diagnosis present

## 2018-12-07 DIAGNOSIS — E559 Vitamin D deficiency, unspecified: Secondary | ICD-10-CM | POA: Diagnosis present

## 2018-12-07 DIAGNOSIS — I70202 Unspecified atherosclerosis of native arteries of extremities, left leg: Secondary | ICD-10-CM | POA: Diagnosis not present

## 2018-12-07 DIAGNOSIS — Z66 Do not resuscitate: Secondary | ICD-10-CM | POA: Diagnosis present

## 2018-12-07 DIAGNOSIS — Z20828 Contact with and (suspected) exposure to other viral communicable diseases: Secondary | ICD-10-CM | POA: Diagnosis present

## 2018-12-07 DIAGNOSIS — Z8673 Personal history of transient ischemic attack (TIA), and cerebral infarction without residual deficits: Secondary | ICD-10-CM

## 2018-12-07 DIAGNOSIS — L97909 Non-pressure chronic ulcer of unspecified part of unspecified lower leg with unspecified severity: Secondary | ICD-10-CM

## 2018-12-07 DIAGNOSIS — G8190 Hemiplegia, unspecified affecting unspecified side: Secondary | ICD-10-CM | POA: Diagnosis present

## 2018-12-07 DIAGNOSIS — J439 Emphysema, unspecified: Secondary | ICD-10-CM | POA: Diagnosis present

## 2018-12-07 DIAGNOSIS — Z79891 Long term (current) use of opiate analgesic: Secondary | ICD-10-CM | POA: Diagnosis not present

## 2018-12-07 DIAGNOSIS — E1152 Type 2 diabetes mellitus with diabetic peripheral angiopathy with gangrene: Secondary | ICD-10-CM | POA: Diagnosis present

## 2018-12-07 DIAGNOSIS — G894 Chronic pain syndrome: Secondary | ICD-10-CM | POA: Diagnosis present

## 2018-12-07 DIAGNOSIS — J9601 Acute respiratory failure with hypoxia: Secondary | ICD-10-CM | POA: Diagnosis present

## 2018-12-07 DIAGNOSIS — Z90710 Acquired absence of both cervix and uterus: Secondary | ICD-10-CM

## 2018-12-07 DIAGNOSIS — N183 Chronic kidney disease, stage 3 (moderate): Secondary | ICD-10-CM | POA: Diagnosis present

## 2018-12-07 DIAGNOSIS — I5033 Acute on chronic diastolic (congestive) heart failure: Secondary | ICD-10-CM | POA: Diagnosis present

## 2018-12-07 DIAGNOSIS — I482 Chronic atrial fibrillation, unspecified: Secondary | ICD-10-CM | POA: Diagnosis present

## 2018-12-07 DIAGNOSIS — I130 Hypertensive heart and chronic kidney disease with heart failure and stage 1 through stage 4 chronic kidney disease, or unspecified chronic kidney disease: Principal | ICD-10-CM | POA: Diagnosis present

## 2018-12-07 DIAGNOSIS — E039 Hypothyroidism, unspecified: Secondary | ICD-10-CM | POA: Diagnosis present

## 2018-12-07 DIAGNOSIS — E1151 Type 2 diabetes mellitus with diabetic peripheral angiopathy without gangrene: Secondary | ICD-10-CM | POA: Diagnosis present

## 2018-12-07 DIAGNOSIS — E079 Disorder of thyroid, unspecified: Secondary | ICD-10-CM | POA: Diagnosis present

## 2018-12-07 DIAGNOSIS — J45909 Unspecified asthma, uncomplicated: Secondary | ICD-10-CM | POA: Diagnosis present

## 2018-12-07 DIAGNOSIS — E43 Unspecified severe protein-calorie malnutrition: Secondary | ICD-10-CM | POA: Diagnosis present

## 2018-12-07 DIAGNOSIS — Z79890 Hormone replacement therapy (postmenopausal): Secondary | ICD-10-CM

## 2018-12-07 DIAGNOSIS — I70261 Atherosclerosis of native arteries of extremities with gangrene, right leg: Secondary | ICD-10-CM | POA: Diagnosis not present

## 2018-12-07 DIAGNOSIS — R0602 Shortness of breath: Secondary | ICD-10-CM

## 2018-12-07 DIAGNOSIS — E11649 Type 2 diabetes mellitus with hypoglycemia without coma: Secondary | ICD-10-CM | POA: Diagnosis present

## 2018-12-07 DIAGNOSIS — Z6826 Body mass index (BMI) 26.0-26.9, adult: Secondary | ICD-10-CM

## 2018-12-07 DIAGNOSIS — I70299 Other atherosclerosis of native arteries of extremities, unspecified extremity: Secondary | ICD-10-CM

## 2018-12-07 DIAGNOSIS — Z7902 Long term (current) use of antithrombotics/antiplatelets: Secondary | ICD-10-CM

## 2018-12-07 DIAGNOSIS — Z79899 Other long term (current) drug therapy: Secondary | ICD-10-CM

## 2018-12-07 DIAGNOSIS — Z823 Family history of stroke: Secondary | ICD-10-CM

## 2018-12-07 DIAGNOSIS — Z1159 Encounter for screening for other viral diseases: Secondary | ICD-10-CM

## 2018-12-07 DIAGNOSIS — Z801 Family history of malignant neoplasm of trachea, bronchus and lung: Secondary | ICD-10-CM

## 2018-12-07 LAB — BLOOD GAS, ARTERIAL
Acid-base deficit: 2.8 mmol/L — ABNORMAL HIGH (ref 0.0–2.0)
Bicarbonate: 21.9 mmol/L (ref 20.0–28.0)
FIO2: 40
O2 Saturation: 99.6 %
Patient temperature: 37
pCO2 arterial: 37 mmHg (ref 32.0–48.0)
pH, Arterial: 7.38 (ref 7.350–7.450)
pO2, Arterial: 182 mmHg — ABNORMAL HIGH (ref 83.0–108.0)

## 2018-12-07 LAB — RESPIRATORY PANEL BY PCR

## 2018-12-07 LAB — CBC
HCT: 38.5 % (ref 36.0–46.0)
HCT: 40.5 % (ref 36.0–46.0)
Hemoglobin: 11.2 g/dL — ABNORMAL LOW (ref 12.0–15.0)
Hemoglobin: 12.2 g/dL (ref 12.0–15.0)
MCH: 27.9 pg (ref 26.0–34.0)
MCH: 27.6 pg (ref 26.0–34.0)
MCHC: 29.1 g/dL — ABNORMAL LOW (ref 30.0–36.0)
MCHC: 30.1 g/dL (ref 30.0–36.0)
MCV: 96 fL (ref 80.0–100.0)
MCV: 91.6 fL (ref 80.0–100.0)
Platelets: 162 10*3/uL (ref 150–400)
Platelets: 231 10*3/uL (ref 150–400)
RBC: 4.01 MIL/uL (ref 3.87–5.11)
RBC: 4.42 MIL/uL (ref 3.87–5.11)
RDW: 19.6 % — ABNORMAL HIGH (ref 11.5–15.5)
RDW: 19.4 % — ABNORMAL HIGH (ref 11.5–15.5)
WBC: 6.1 10*3/uL (ref 4.0–10.5)
WBC: 7.1 10*3/uL (ref 4.0–10.5)
nRBC: 0 % (ref 0.0–0.2)
nRBC: 0 % (ref 0.0–0.2)

## 2018-12-07 LAB — BASIC METABOLIC PANEL
Anion gap: 12 (ref 5–15)
Anion gap: 10 (ref 5–15)
BUN: 34 mg/dL — ABNORMAL HIGH (ref 8–23)
BUN: 33 mg/dL — ABNORMAL HIGH (ref 8–23)
CO2: 23 mmol/L (ref 22–32)
CO2: 21 mmol/L — ABNORMAL LOW (ref 22–32)
Calcium: 9.7 mg/dL (ref 8.9–10.3)
Calcium: 9.2 mg/dL (ref 8.9–10.3)
Chloride: 101 mmol/L (ref 98–111)
Chloride: 104 mmol/L (ref 98–111)
Creatinine, Ser: 1.74 mg/dL — ABNORMAL HIGH (ref 0.44–1.00)
Creatinine, Ser: 1.66 mg/dL — ABNORMAL HIGH (ref 0.44–1.00)
GFR calc Af Amer: 30 mL/min — ABNORMAL LOW (ref >60)
GFR calc Af Amer: 31 mL/min — ABNORMAL LOW (ref >60)
GFR calc non Af Amer: 26 mL/min — ABNORMAL LOW (ref >60)
GFR calc non Af Amer: 27 mL/min — ABNORMAL LOW (ref >60)
Glucose, Bld: 89 mg/dL (ref 70–99)
Glucose, Bld: 141 mg/dL — ABNORMAL HIGH (ref 70–99)
Potassium: 3.9 mmol/L (ref 3.5–5.1)
Potassium: 4.1 mmol/L (ref 3.5–5.1)
Sodium: 136 mmol/L (ref 135–145)
Sodium: 135 mmol/L (ref 135–145)

## 2018-12-07 LAB — SARS CORONAVIRUS 2 BY RT PCR (HOSPITAL ORDER, PERFORMED IN ~~LOC~~ HOSPITAL LAB)
SARS Coronavirus 2: NEGATIVE
SARS Coronavirus 2: NEGATIVE

## 2018-12-07 LAB — GLUCOSE, CAPILLARY
Glucose-Capillary: 24 mg/dL — CL (ref 70–99)
Glucose-Capillary: 38 mg/dL — CL (ref 70–99)
Glucose-Capillary: 41 mg/dL — CL (ref 70–99)
Glucose-Capillary: 66 mg/dL — ABNORMAL LOW (ref 70–99)
Glucose-Capillary: 31 mg/dL — CL (ref 70–99)
Glucose-Capillary: 45 mg/dL — ABNORMAL LOW (ref 70–99)

## 2018-12-07 LAB — CREATININE, SERUM
Creatinine, Ser: 1.35 mg/dL — ABNORMAL HIGH (ref 0.44–1.00)
GFR calc Af Amer: 40 mL/min — ABNORMAL LOW (ref >60)
GFR calc non Af Amer: 35 mL/min — ABNORMAL LOW (ref >60)

## 2018-12-07 LAB — GLUCOSE, RANDOM
Glucose, Bld: 163 mg/dL — ABNORMAL HIGH (ref 70–99)
Glucose, Bld: 101 mg/dL — ABNORMAL HIGH (ref 70–99)

## 2018-12-07 LAB — BRAIN NATRIURETIC PEPTIDE: B Natriuretic Peptide: 241 pg/mL — ABNORMAL HIGH (ref 0.0–100.0)

## 2018-12-07 SURGERY — LOWER EXTREMITY ANGIOGRAPHY
Anesthesia: Moderate Sedation | Laterality: Right

## 2018-12-07 MED ORDER — ONDANSETRON HCL 4 MG/2ML IJ SOLN: 4.0000 mg | Freq: Four times a day (QID) | INTRAMUSCULAR | Status: DC | PRN

## 2018-12-07 MED ORDER — BISACODYL 5 MG PO TBEC: 5.0000 mg | DELAYED_RELEASE_TABLET | Freq: Every day | ORAL | Status: DC | PRN

## 2018-12-07 MED ORDER — PRO-STAT SUGAR FREE PO LIQD
30.0000 mL | Freq: Every day | ORAL | Status: DC
  Administered 2018-12-07 – 2018-12-09 (×3): 30 mL via ORAL

## 2018-12-07 MED ORDER — DEXTROSE 50 % IV SOLN
50.0000 mL | Freq: Once | INTRAVENOUS | Status: DC
  Filled 2018-12-07: qty 50

## 2018-12-07 MED ORDER — DULOXETINE HCL 30 MG PO CPEP
30.0000 mg | ORAL_CAPSULE | ORAL | Status: DC
  Administered 2018-12-09 – 2018-12-11 (×3): 30 mg via ORAL
  Filled 2018-12-07 (×4): qty 1

## 2018-12-07 MED ORDER — LEVOTHYROXINE SODIUM 25 MCG PO TABS
25.0000 ug | ORAL_TABLET | Freq: Every day | ORAL | Status: DC
  Administered 2018-12-07 – 2018-12-10 (×4): 25 ug via ORAL
  Filled 2018-12-07 (×5): qty 1

## 2018-12-07 MED ORDER — DEXTROSE 50 % IV SOLN
INTRAVENOUS | Status: AC
  Filled 2018-12-07: qty 50

## 2018-12-07 MED ORDER — SODIUM CHLORIDE 0.9 % IV SOLN: INTRAVENOUS | Status: DC

## 2018-12-07 MED ORDER — METHYLPREDNISOLONE SODIUM SUCC 125 MG IJ SOLR: 125.0000 mg | Freq: Once | INTRAMUSCULAR | Status: DC | PRN

## 2018-12-07 MED ORDER — DEXTROSE 5 % IV SOLN
INTRAVENOUS | Status: DC
  Administered 2018-12-07: 17:00:00 via INTRAVENOUS

## 2018-12-07 MED ORDER — FUROSEMIDE 10 MG/ML IJ SOLN
40.0000 mg | Freq: Two times a day (BID) | INTRAMUSCULAR | Status: DC
  Administered 2018-12-07 – 2018-12-10 (×7): 40 mg via INTRAVENOUS
  Filled 2018-12-07 (×7): qty 4

## 2018-12-07 MED ORDER — ALLOPURINOL 100 MG PO TABS: 100.0000 mg | ORAL_TABLET | Freq: Every day | ORAL | Status: DC

## 2018-12-07 MED ORDER — FUROSEMIDE 40 MG PO TABS: 40.0000 mg | ORAL_TABLET | Freq: Every day | ORAL | Status: DC

## 2018-12-07 MED ORDER — PANTOPRAZOLE SODIUM 40 MG PO TBEC
40.0000 mg | DELAYED_RELEASE_TABLET | Freq: Every day | ORAL | Status: DC
  Administered 2018-12-08 – 2018-12-11 (×4): 40 mg via ORAL
  Filled 2018-12-07 (×4): qty 1

## 2018-12-07 MED ORDER — ALLOPURINOL 100 MG PO TABS
100.0000 mg | ORAL_TABLET | Freq: Every day | ORAL | Status: DC
  Administered 2018-12-08 – 2018-12-11 (×4): 100 mg via ORAL
  Filled 2018-12-07 (×5): qty 1

## 2018-12-07 MED ORDER — ENOXAPARIN SODIUM 30 MG/0.3ML ~~LOC~~ SOLN: 30.0000 mg | SUBCUTANEOUS | Status: DC

## 2018-12-07 MED ORDER — ACETAMINOPHEN 325 MG PO TABS
650.0000 mg | ORAL_TABLET | Freq: Four times a day (QID) | ORAL | Status: DC | PRN
  Administered 2018-12-11: 650 mg via ORAL
  Filled 2018-12-07: qty 2

## 2018-12-07 MED ORDER — ACETAMINOPHEN 650 MG RE SUPP: 650.0000 mg | Freq: Four times a day (QID) | RECTAL | Status: DC | PRN

## 2018-12-07 MED ORDER — MIDAZOLAM HCL 5 MG/5ML IJ SOLN
INTRAMUSCULAR | Status: AC
  Filled 2018-12-07: qty 5

## 2018-12-07 MED ORDER — ENOXAPARIN SODIUM 30 MG/0.3ML ~~LOC~~ SOLN
30.0000 mg | SUBCUTANEOUS | Status: DC
  Administered 2018-12-07 – 2018-12-09 (×3): 30 mg via SUBCUTANEOUS
  Filled 2018-12-07 (×3): qty 0.3

## 2018-12-07 MED ORDER — CLOPIDOGREL BISULFATE 75 MG PO TABS
75.0000 mg | ORAL_TABLET | Freq: Every day | ORAL | Status: DC
  Administered 2018-12-08 – 2018-12-11 (×4): 75 mg via ORAL
  Filled 2018-12-07 (×5): qty 1

## 2018-12-07 MED ORDER — ATORVASTATIN CALCIUM 20 MG PO TABS: 20.0000 mg | ORAL_TABLET | Freq: Every day | ORAL | Status: DC

## 2018-12-07 MED ORDER — METOPROLOL TARTRATE 50 MG PO TABS
200.0000 mg | ORAL_TABLET | Freq: Two times a day (BID) | ORAL | Status: DC
  Administered 2018-12-08 – 2018-12-11 (×6): 200 mg via ORAL
  Filled 2018-12-07 (×8): qty 4

## 2018-12-07 MED ORDER — LEVOTHYROXINE SODIUM 25 MCG PO TABS: 25.0000 ug | ORAL_TABLET | Freq: Every day | ORAL | Status: DC

## 2018-12-07 MED ORDER — HYDROMORPHONE HCL 1 MG/ML IJ SOLN: 1.0000 mg | Freq: Once | INTRAMUSCULAR | Status: DC | PRN

## 2018-12-07 MED ORDER — ONDANSETRON HCL 4 MG PO TABS: 4.0000 mg | ORAL_TABLET | Freq: Four times a day (QID) | ORAL | Status: DC | PRN

## 2018-12-07 MED ORDER — ASPIRIN EC 81 MG PO TBEC: 81.0000 mg | DELAYED_RELEASE_TABLET | Freq: Every day | ORAL | Status: DC

## 2018-12-07 MED ORDER — METOPROLOL TARTRATE 50 MG PO TABS: 200.0000 mg | ORAL_TABLET | Freq: Two times a day (BID) | ORAL | Status: DC

## 2018-12-07 MED ORDER — AMITRIPTYLINE HCL 25 MG PO TABS
12.5000 mg | ORAL_TABLET | Freq: Every day | ORAL | Status: DC
  Administered 2018-12-07 – 2018-12-10 (×4): 12.5 mg via ORAL
  Filled 2018-12-07 (×6): qty 1

## 2018-12-07 MED ORDER — ATORVASTATIN CALCIUM 20 MG PO TABS
20.0000 mg | ORAL_TABLET | Freq: Every day | ORAL | Status: DC
  Administered 2018-12-07: 21:00:00 20 mg via ORAL
  Filled 2018-12-07: qty 1

## 2018-12-07 MED ORDER — DEXTROSE 50 % IV SOLN
50.0000 mL | Freq: Once | INTRAVENOUS | Status: AC
  Administered 2018-12-07: 50 mL via INTRAVENOUS

## 2018-12-07 MED ORDER — CEFAZOLIN SODIUM-DEXTROSE 2-4 GM/100ML-% IV SOLN
INTRAVENOUS | Status: AC
  Filled 2018-12-07: qty 100

## 2018-12-07 MED ORDER — FAMOTIDINE 20 MG PO TABS: 40.0000 mg | ORAL_TABLET | Freq: Once | ORAL | Status: DC | PRN

## 2018-12-07 MED ORDER — FENTANYL CITRATE (PF) 100 MCG/2ML IJ SOLN
INTRAMUSCULAR | Status: AC
  Filled 2018-12-07: qty 2

## 2018-12-07 MED ORDER — DULOXETINE HCL 30 MG PO CPEP: 30.0000 mg | ORAL_CAPSULE | ORAL | Status: DC

## 2018-12-07 MED ORDER — ASPIRIN EC 81 MG PO TBEC
81.0000 mg | DELAYED_RELEASE_TABLET | Freq: Every day | ORAL | Status: DC
  Filled 2018-12-07: qty 1

## 2018-12-07 MED ORDER — GABAPENTIN 400 MG PO CAPS
400.0000 mg | ORAL_CAPSULE | Freq: Two times a day (BID) | ORAL | Status: DC
  Administered 2018-12-07 – 2018-12-11 (×8): 400 mg via ORAL
  Filled 2018-12-07 (×8): qty 1

## 2018-12-07 MED ORDER — TOPIRAMATE 25 MG PO TABS
25.0000 mg | ORAL_TABLET | Freq: Every day | ORAL | Status: DC
  Administered 2018-12-07 – 2018-12-10 (×4): 25 mg via ORAL
  Filled 2018-12-07 (×5): qty 1

## 2018-12-07 MED ORDER — MIDAZOLAM HCL 2 MG/ML PO SYRP: 8.0000 mg | ORAL_SOLUTION | Freq: Once | ORAL | Status: DC | PRN

## 2018-12-07 MED ORDER — HEPARIN SODIUM (PORCINE) 1000 UNIT/ML IJ SOLN
INTRAMUSCULAR | Status: AC
  Filled 2018-12-07: qty 1

## 2018-12-07 MED ORDER — DIPHENHYDRAMINE HCL 50 MG/ML IJ SOLN: 50.0000 mg | Freq: Once | INTRAMUSCULAR | Status: DC | PRN

## 2018-12-07 NOTE — Progress Notes (Signed)
Hypoglycemic Event  CBG: 31  Treatment:2 amps dextrose 50%  Symptoms:aymptomatic   Follow-up CBG: Time:1557CBG Result:45  Possible Reasons for Event: pt had not eaten for procedure that was supposed to have been done today  Comments/MD notified: Dr. Vianne Bulls notified. Orders will be put in     [facility]

## 2018-12-07 NOTE — Progress Notes (Signed)
Anticoagulation monitoring(Lovenox):  85 yo female ordered Lovenox 40 mg Q24h  Filed Weights   12/07/18 1108 12/07/18 1457  Weight: 174 lb (78.9 kg) 176 lb 11.2 oz (80.2 kg)   BMI 28.5  Lab Results  Component Value Date   CREATININE 1.66 (H) 12/07/2018   CREATININE 1.74 (H) 12/07/2018   CREATININE 1.62 (H) 10/13/2018   Estimated Creatinine Clearance: 24.6 mL/min (A) (by C-G formula based on SCr of 1.66 mg/dL (H)). Hemoglobin & Hematocrit     Component Value Date/Time   HGB 8.6 (L) 10/13/2018 0539   HGB 12.0 09/18/2014 0347   HCT 28.0 (L) 10/13/2018 0539   HCT 37.2 09/18/2014 0347     Per Protocol for Patient with estCrcl < 30 ml/min and BMI < 40, will transition to Lovenox 30 mg Q24h.

## 2018-12-07 NOTE — H&P (Signed)
[facility] [place] NAME: [name]    MR#:  [id number]  DATE OF BIRTH:  [birth year]  DATE OF ADMISSION:  12/07/2018  PRIMARY CARE PHYSICIAN: Rosaland Lao, NP   REQUESTING/REFERRING PHYSICIAN: Dr. Delana Meyer  CHIEF COMPLAINT: Hypoxia  No chief complaint on file.   HISTORY OF PRESENT ILLNESS:  [name]  is a 85 y.o. female with a known history of PAD supposed to have  leg angiogram by Dr. Delana Meyer but developed hypoxia, patient O2 sats were checked today as preop 13 and found to have l O2 saturations of low 70s on room air so procedure was canceled, medicine was consulted.  Has peripheral artery disease, patient is from nursing home, did not have any recent fever, cough.  Patient found to have significant hypoxia with 75 room air, she is maintaining an upper 80s on 5 L, COVID-19 test is done and waiting for the result.  Because of persistent hypoxia requiring high flow nasal cannula now, persistent hypoglycemia will admit To stepdown unit.  Discussed with Dr. Mortimer Fries. Past Medical History:  Diagnosis Date  . Abnormal glucose   . Anxiety   . Arthritis   . Asthma   . Calculus of gallbladder without mention of cholecystitis, with obstruction   . Cancer ([place])    kidney  . Candidiasis of the esophagus   . Cataract cortical, senile   . CHF (congestive heart failure) ([place])   . Chronic kidney disease, stage III (moderate) (HCC)   . Chronic pain   . Chronic pain syndrome   . Corns and callosities   . Debility   . Depression   . Dermatophytosis of nail   . Diabetes mellitus without complication ([place])    unspecified, without mention of complication  . Difficult intubation   . Dysphagia   . Dyspnea    with exertion  . Edema, unspecified   . Emphysema lung ([place])   . Emphysema of lung ([place])   . Esophageal reflux   . Essential hypertension, benign   . Generalized atherosclerosis   . Glaucoma (increased eye pressure)   .  Gout, unspecified   . Hemiplegia ([place])   . Hereditary and idiopathic peripheral neuropathy   . Hyperglycemia   . Hyperlipidemia   . Hypertension   . Hypoglycemia, unspecified   . Hypopotassemia   . Hypothyroidism   . Migraine, unspecified, without mention of intractable migraine without mention of status migrainosus   . Other vitamin B12 deficiency anemia   . Pain in limb   . Peripheral vascular disease ([place])   . Personal history of fall   . Stroke ([place])   . Thyroid disease   . Ulcer of foot ([place])   . Unspecified asthma(493.90)   . Unspecified constipation   . Unspecified hypertensive heart disease with heart failure(402.91)   . Unspecified urinary incontinence   . Unspecified vitamin D deficiency     PAST SURGICAL HISTOIRY:   Past Surgical History:  Procedure Laterality Date  . ABDOMINAL HYSTERECTOMY    . arterial thrombi stents in both legs    . BACK SURGERY    . BACK SURGERY    . CATARACT EXTRACTION    . COLONOSCOPY    . ESOPHAGOGASTRODUODENOSCOPY (EGD) WITH PROPOFOL N/A 06/19/2016   Procedure: ESOPHAGOGASTRODUODENOSCOPY (EGD) WITH PROPOFOL;  Surgeon: Lollie Sails, MD;  Location: [facility] ENDOSCOPY;  Service: Endoscopy;  Laterality: N/A;  . ESOPHAGOGASTRODUODENOSCOPY (EGD) WITH PROPOFOL N/A 04/10/2017   Procedure: ESOPHAGOGASTRODUODENOSCOPY (  EGD) WITH PROPOFOL;  Surgeon: Lollie Sails, MD;  Location: [facility] ENDOSCOPY;  Service: Endoscopy;  Laterality: N/A;  . EYE SURGERY     cataract extraction  . LOWER EXTREMITY ANGIOGRAPHY Right 10/07/2016   Procedure: Lower Extremity Angiography;  Surgeon: Katha Cabal, MD;  Location: [facility] CV LAB;  Service: Cardiovascular;  Laterality: Right;  . LOWER EXTREMITY ANGIOGRAPHY Right 02/23/2018   Procedure: LOWER EXTREMITY ANGIOGRAPHY;  Surgeon: Katha Cabal, MD;  Location: [place] CV LAB;  Service: Cardiovascular;  Laterality: Right;  . LOWER EXTREMITY ANGIOGRAPHY Left 05/18/2018   Procedure: LOWER EXTREMITY  ANGIOGRAPHY;  Surgeon: Katha Cabal, MD;  Location: [facility] CV LAB;  Service: Cardiovascular;  Laterality: Left;  . PERIPHERAL VASCULAR CATHETERIZATION Left 09/02/2016   Procedure: Lower Extremity Angiography;  Surgeon: Katha Cabal, MD;  Location: [facility] CV LAB;  Service: Cardiovascular;  Laterality: Left;  . PERIPHERAL VASCULAR CATHETERIZATION Right 09/16/2016   Procedure: Lower Extremity Angiography;  Surgeon: Katha Cabal, MD;  Location: [facility] CV LAB;  Service: Cardiovascular;  Laterality: Right;    SOCIAL HISTORY:   Social History   Tobacco Use  . Smoking status: Never Smoker  . Smokeless tobacco: Never Used  Substance Use Topics  . Alcohol use: No    FAMILY HISTORY:   Family History  Problem Relation Age of Onset  . Cancer Father   . Stroke Father   . Seizures Mother   . Lung cancer Sister   . Bone cancer Sister     DRUG ALLERGIES:  No Known Allergies  REVIEW OF SYSTEMS:  CONSTITUTIONAL: No fever, has fatigue, generalized weakness.  Patient looks chronically ill.Marland Kitchen EYES: No blurred or double vision.  EARS, NOSE, AND THRO AT: No tinnitus or ear pain.  RESPIRATORY: Mild shortness of breath CARDIOVASCULAR: No chest pain, orthopnea, edema.  GASTROINTESTINAL: No nausea, vomiting, diarrhea or abdominal pain.  GENITOURINARY: No dysuria, hematuria.  ENDOCRINE: No polyuria, nocturia,  HEMATOLOGY: No anemia, easy bruising or bleeding SKIN: No rash or lesion. MUSCULOSKELETAL: No joint pain or arthritis.   NEUROLOGIC: No tingling, numbness, weakness.  PSYCHIATRY: No anxiety or depression.   MEDICATIONS AT HOME:   Prior to Admission medications   Medication Sig Start Date End Date Taking? Authorizing Provider  acetaminophen (TYLENOL) 325 MG tablet Take 650 mg by mouth every 4 (four) hours as needed for mild pain.    Yes [provider]  allopurinol (ZYLOPRIM) 100 MG tablet Take 100 mg by mouth daily.   Yes [provider]  Amino Acids-Protein Hydrolys (FEEDING SUPPLEMENT, PRO-STAT SUGAR FREE 64,) LIQD Take 30 mLs by mouth at bedtime.   Yes [provider]  amitriptyline (ELAVIL) 25 MG tablet Take 12.5 mg by mouth at bedtime.    Yes [provider]  aspirin EC 81 MG EC tablet Take 1 tablet (81 mg total) by mouth daily. 07/22/16  Yes Fritzi Mandes, MD  atorvastatin (LIPITOR) 20 MG tablet Take 20 mg by mouth daily.   Yes [provider]  Cholecalciferol (VITAMIN D-3) 1000 UNITS CAPS Take 2,000 Units by mouth daily.    Yes [provider]  clopidogrel (PLAVIX) 75 MG tablet Take 1 tablet (75 mg total) by mouth daily. 02/24/18 02/24/19 Yes Schnier, Dolores Lory, MD  DULoxetine (CYMBALTA) 30 MG capsule Take 30 mg by mouth every morning.   Yes [provider]  furosemide (LASIX) 40 MG tablet Take 40 mg by mouth daily.   Yes [provider]  gabapentin (  NEURONTIN) 400 MG capsule Take 400 mg by mouth 2 (two) times daily.  11/28/17  Yes [provider]  latanoprost (XALATAN) 0.005 % ophthalmic solution Place 1 drop into both eyes at bedtime.   Yes [provider]  levothyroxine (SYNTHROID, LEVOTHROID) 25 MCG tablet Take 25 mcg by mouth daily before breakfast.   Yes [provider]  metoprolol tartrate (LOPRESSOR) 100 MG tablet Take 200 mg by mouth 2 (two) times daily.    Yes [provider]  ondansetron (ZOFRAN) 4 MG tablet Take 4 mg by mouth every 8 (eight) hours as needed for nausea or vomiting.   Yes [provider]  pantoprazole (PROTONIX) 40 MG tablet Take 40 mg by mouth daily.    Yes [provider]  polyethylene glycol (MIRALAX / GLYCOLAX) packet Take 17 g by mouth daily as needed for mild constipation or moderate constipation.   Yes [provider]  potassium chloride (K-DUR,KLOR-CON) 10 MEQ tablet Take 10 mEq by mouth daily.   Yes [provider]  senna (SENOKOT) 8.6 MG tablet Take 2 tablets by mouth 2  (two) times daily.    Yes [provider]  topiramate (TOPAMAX) 25 MG tablet Take 25 mg by mouth at bedtime.   Yes [provider]  traMADol (ULTRAM) 50 MG tablet Take 1 tablet (50 mg total) by mouth every 8 (eight) hours as needed (for pain control). Patient taking differently: Take 50 mg by mouth every 6 (six) hours as needed (for pain control).  10/13/18  Yes Pyreddy, Reatha Harps, MD      VITAL SIGNS:  Blood pressure 108/67, pulse (!) 55, temperature 97.6 F (36.4 C), temperature source Oral, resp. rate 19, height 5\' 6"  (1.676 m), weight 80.2 kg, SpO2 97 %.  PHYSICAL EXAMINATION:  GENERAL:  85 y.o.-year-old patient lying in the bed with no acute distress.  EYES: Pupils equal, round, reactive to light and accommodation. No scleral icterus. Extraocular muscles intact.  HEENT: Head atraumatic, normocephalic. Oropharynx and nasopharynx clear.  NECK:  Supple, no jugular venous distention. No thyroid enlargement, no tenderness.  LUNGS: Normal breath sounds bilaterally, no wheezing, rales,rhonchi or crepitation. No use of accessory muscles of respiration.  CARDIOVASCULAR: S1, S2 normal. No murmurs, rubs, or gallops.  ABDOMEN: Soft, nontender, nondistended. Bowel sounds present. No organomegaly or mass.  EXTREMITIES: No pedal edema, cyanosis, or clubbing.  NEUROLOGIC: Cranial nerves II through XII are intact. Muscle strength 5/5 in all extremities. Sensation intact. Gait not checked.  PSYCHIATRIC: The patient is alert and oriented x 3.  SKIN: No obvious rash, lesion, or ulcer.   LABORATORY PANEL:   CBC No results for input(s): WBC, HGB, HCT, PLT in the last 168 hours. ------------------------------------------------------------------------------------------------------------------  Chemistries  Recent Labs  Lab 12/07/18 1226  NA 135  K 4.1  CL 104  CO2 21*  GLUCOSE 141*  BUN 33*  CREATININE 1.66*  CALCIUM 9.2    ------------------------------------------------------------------------------------------------------------------  Cardiac Enzymes No results for input(s): TROPONINI in the last 168 hours. ------------------------------------------------------------------------------------------------------------------  RADIOLOGY:  Dg Chest Port 1 View  Result Date: 12/07/2018 CLINICAL DATA:  Shortness of breath EXAM: PORTABLE CHEST 1 VIEW COMPARISON:  October 09, 2018 FINDINGS: There is no appreciable edema or consolidation. There is cardiomegaly with pulmonary vascularity within normal limits. No adenopathy. There is aortic atherosclerosis. No bone lesions. IMPRESSION: Stable cardiac enlargement. No edema or consolidation. Aortic Atherosclerosis (ICD10-I70.0). Electronically Signed   By: Lowella Grip III M.D.   On: 12/07/2018 14:08    EKG:  Orders placed or performed during the hospital encounter of 12/07/18  . EKG 12-Lead  . EKG 12-Lead    IMPRESSION AND PLAN:   85 year old female patient history of peripheral artery disease, neuropathy, GERD for leg angiogram by vascular development of hypoxia medicine is consulted for evaluation. 1.  Acute respiratory failure with hypoxia, because she is from nursing home with this acute respiratory failure with hypoxia we need to rule out COVID-19, patient respiratory failure is worsening, requiring high flow nasal cannula, O2 sats are up in upper 80s on 5 L.  Chest x-ray is normal, patient needs CT of the chest, she will admitted to telemetry but because of worsening respiratory status we will transfer her to stepdown unit. 2.  History of diastolic heart failure, admitted in February, patient received IV Lasix, ACE inhibitor's, echocardiogram that time showed EF 50%, will start IV diuretics. 3.  Persistent hypoglycemia likely due to severe malnutrition and also not eating for the planned procedure of leg angiogram, start D5 infusion, check frequent blood  sugars every 2 hours, For vascular dementia, CODE STATUS is DNR as per previous records   All the records are reviewed and case discussed with ED provider. Management plans discussed with the patient, family and they are in agreement.  CODE STATUS: DNR  TOTAL TIME TAKING CARE OF THIS PATIENT: 55 minutes.    Epifanio Lesches M.D on 12/07/2018 at 4:10 PM  Between 7am to 6pm - Pager - [phone]  After 6pm go to [email or web address] - password [email or web address] [facility]  Office  [phone]  CC: Primary care physician; Rosaland Lao, NP  Note: This dictation was prepared with Dragon dictation along with smaller phrase technology. Any transcriptional errors that result from this process are unintentional.

## 2018-12-07 NOTE — Progress Notes (Signed)
Son [name] notified of admission.  Son provided password after speaking to patient.

## 2018-12-07 NOTE — Progress Notes (Signed)
Hypoglycemia corrected, discontinue D5 infusion, patient hypoxia is also improved, ABG showed patient PO2 is 182, now that he is on 3 L of oxygen, coronavirus testing negative for 2 times.  Patient will be monitored on telemetry.  Will not transfer the patient to stepdown.

## 2018-12-07 NOTE — Progress Notes (Signed)
Contacted Dr. Vianne Bulls to discuss if pt needs to be transferred to the stepdown unit.  Per MD if pts O2 sats remain within normal limits she does not require transfer to the stepdown unit.  ICU nurse placed nasal O2 probe, she is currently on 3L via nasal canula and O2 sats were 100%.  Therefore, pt does not require transfer to the stepdown unit.    Marda Stalker, [place] Pager [phone] (please enter 7 digits) PCCM Consult Pager [phone] (please enter 7 digits)

## 2018-12-07 NOTE — Progress Notes (Signed)
CBG was 31.  She got 2 amps of D50.  Repeat CBG is 45.  Started D5NS IV infusion.  Ordered a serum glucose.  Blood draw was postponed because of confusion with lab personnel about the Airborne/Contact isolation. Have since learned that although her CBG this morning was very low her serum glucose was WNL. Due to her PAD we are unable to get a consistent pulse ox.  An ABG has been ordered.

## 2018-12-07 NOTE — Progress Notes (Signed)
Dr. Ronalee Belts alerted of patient's CBG and BMP glucose level. Repeat STAT BMP to be done. Patient is also experiencing new a-fib. Per Dr. Ronalee Belts STAT EKG to confirm arrhythmia to be done. Patient was hypoxic upon presentation to the unit, oxygen saturation was upper 70's on room air. Patient was not symptomatic with hypoxia. Placed patient on 6L O2, oxygen saturation increased to 100% within about 15 mins. Will continue to decrease oxygen as O2 level increases. Per Dr. Ronalee Belts case is to be cancelled today and patient will be admitted.

## 2018-12-08 LAB — BASIC METABOLIC PANEL
Anion gap: 11 (ref 5–15)
BUN: 33 mg/dL — ABNORMAL HIGH (ref 8–23)
CO2: 21 mmol/L — ABNORMAL LOW (ref 22–32)
Calcium: 9.3 mg/dL (ref 8.9–10.3)
Chloride: 104 mmol/L (ref 98–111)
Creatinine, Ser: 1.54 mg/dL — ABNORMAL HIGH (ref 0.44–1.00)
GFR calc Af Amer: 34 mL/min — ABNORMAL LOW (ref >60)
GFR calc non Af Amer: 30 mL/min — ABNORMAL LOW (ref >60)
Glucose, Bld: 80 mg/dL (ref 70–99)
Potassium: 3.2 mmol/L — ABNORMAL LOW (ref 3.5–5.1)
Sodium: 136 mmol/L (ref 135–145)

## 2018-12-08 LAB — CBC
HCT: 39.4 % (ref 36.0–46.0)
Hemoglobin: 12.3 g/dL (ref 12.0–15.0)
MCH: 28 pg (ref 26.0–34.0)
MCHC: 31.2 g/dL (ref 30.0–36.0)
MCV: 89.5 fL (ref 80.0–100.0)
Platelets: 223 10*3/uL (ref 150–400)
RBC: 4.4 MIL/uL (ref 3.87–5.11)
RDW: 19.3 % — ABNORMAL HIGH (ref 11.5–15.5)
WBC: 7.5 10*3/uL (ref 4.0–10.5)
nRBC: 0 % (ref 0.0–0.2)

## 2018-12-08 LAB — GLUCOSE, CAPILLARY: Glucose-Capillary: 75 mg/dL (ref 70–99)

## 2018-12-08 LAB — GLUCOSE, RANDOM: Glucose, Bld: 80 mg/dL (ref 70–99)

## 2018-12-08 LAB — MRSA PCR SCREENING: MRSA by PCR: NEGATIVE

## 2018-12-08 MED ORDER — ASPIRIN EC 81 MG PO TBEC
81.0000 mg | DELAYED_RELEASE_TABLET | Freq: Every day | ORAL | Status: DC
  Administered 2018-12-08 – 2018-12-11 (×4): 81 mg via ORAL
  Filled 2018-12-08 (×4): qty 1

## 2018-12-08 MED ORDER — ENSURE ENLIVE PO LIQD
237.0000 mL | Freq: Two times a day (BID) | ORAL | Status: DC
  Administered 2018-12-09 – 2018-12-11 (×4): 237 mL via ORAL

## 2018-12-08 MED ORDER — POTASSIUM CHLORIDE CRYS ER 20 MEQ PO TBCR
40.0000 meq | EXTENDED_RELEASE_TABLET | Freq: Once | ORAL | Status: AC
  Administered 2018-12-08: 40 meq via ORAL
  Filled 2018-12-08: qty 2

## 2018-12-08 MED ORDER — LATANOPROST 0.005 % OP SOLN
1.0000 [drp] | Freq: Every day | OPHTHALMIC | Status: DC
  Administered 2018-12-08 – 2018-12-09 (×2): 1 [drp] via OPHTHALMIC
  Filled 2018-12-08: qty 2.5

## 2018-12-08 MED ORDER — SODIUM CHLORIDE 0.9% FLUSH
3.0000 mL | Freq: Two times a day (BID) | INTRAVENOUS | Status: DC
  Administered 2018-12-08 – 2018-12-10 (×5): 3 mL via INTRAVENOUS

## 2018-12-08 MED ORDER — ADULT MULTIVITAMIN W/MINERALS CH
1.0000 | ORAL_TABLET | Freq: Every day | ORAL | Status: DC
  Administered 2018-12-09 – 2018-12-11 (×3): 1 via ORAL
  Filled 2018-12-08 (×3): qty 1

## 2018-12-08 MED ORDER — ATORVASTATIN CALCIUM 20 MG PO TABS
20.0000 mg | ORAL_TABLET | Freq: Every day | ORAL | Status: DC
  Administered 2018-12-08 – 2018-12-11 (×4): 20 mg via ORAL
  Filled 2018-12-08 (×4): qty 1

## 2018-12-08 NOTE — Plan of Care (Signed)
Weaned to room air.  ?

## 2018-12-08 NOTE — Progress Notes (Signed)
[place] at [facility]                                                                                                                                                                                  Patient Demographics   [name], is a 85 y.o. female, DOB - 10-Nov-1927, UXL:[id number]  Admit date - 12/07/2018   Admitting Physician Epifanio Lesches, MD  Outpatient Primary MD for the patient is Rosaland Lao, NP   LOS - 1  Subjective: Patient's br blood eathing is much improved she is now on room air Blood glucose is also improving Patient states that she is not having any pain currently  Review of Systems:   CONSTITUTIONAL: No documented fever. No fatigue, weakness. No weight gain, no weight loss.  EYES: No blurry or double vision.  ENT: No tinnitus. No postnasal drip. No redness of the oropharynx.  RESPIRATORY: No cough, no wheeze, no hemoptysis. No dyspnea.  CARDIOVASCULAR: No chest pain. No orthopnea. No palpitations. No syncope.  GASTROINTESTINAL: No nausea, no vomiting or diarrhea. No abdominal pain. No melena or hematochezia.  GENITOURINARY: No dysuria or hematuria.  ENDOCRINE: No polyuria or nocturia. No heat or cold intolerance.  HEMATOLOGY: No anemia. No bruising. No bleeding.  INTEGUMENTARY: No rashes. No lesions.  MUSCULOSKELETAL: No arthritis. No swelling. No gout.  NEUROLOGIC: No numbness, tingling, or ataxia. No seizure-type activity.  PSYCHIATRIC: No anxiety. No insomnia. No ADD.    Vitals:   Vitals:   12/08/18 0452 12/08/18 0555 12/08/18 0715 12/08/18 0723  BP: (!) 102/56 (!) 114/57  109/61  Pulse: 70 70  75  Resp: 16 20  19   Temp: 98.9 F (37.2 C)   98.7 F (37.1 C)  TempSrc: Oral   Oral  SpO2: 99%  94% 100%  Weight: 75.1 kg     Height:        Wt Readings from Last 3 Encounters:  12/08/18 75.1 kg  10/13/18 100.1 kg  09/02/18 95.3 kg     Intake/Output Summary (Last 24 hours) at 12/08/2018 1112 Last data  filed at 12/08/2018 0500 Gross per 24 hour  Intake -  Output 600 ml  Net -600 ml    Physical Exam:   GENERAL: Pleasant-appearing in no apparent distress.  HEAD, EYES, EARS, NOSE AND THROAT: Atraumatic, normocephalic. Extraocular muscles are intact. Pupils equal and reactive to light. Sclerae anicteric. No conjunctival injection. No oro-pharyngeal erythema.  NECK: Supple. There is no jugular venous distention. No bruits, no lymphadenopathy, no thyromegaly.  HEART: Regular rate and rhythm,. No murmurs, no rubs, no clicks.  LUNGS: Clear to auscultation bilaterally. No rales or rhonchi. No wheezes.  ABDOMEN:  Soft, flat, nontender, nondistended. Has good bowel sounds. No hepatosplenomegaly appreciated.  EXTREMITIES: No evidence of any cyanosis, clubbing, or peripheral edema.  +2 pedal and radial pulses bilaterally.  NEUROLOGIC: The patient is alert, awake, and oriented x3 with no focal motor or sensory deficits appreciated bilaterally.  SKIN: Moist and warm with no rashes appreciated.  Psych: Not anxious, depressed LN: No inguinal LN enlargement    Antibiotics   Anti-infectives (From admission, onward)   Start     Dose/Rate Route Frequency Ordered Stop   12/07/18 1046  ceFAZolin (ANCEF) 2-4 GM/100ML-% IVPB    Note to Pharmacy:  Despina Arias  : cabinet override      12/07/18 1046 12/07/18 2259   12/06/18 2145  ceFAZolin (ANCEF) IVPB 2g/100 mL premix  Status:  Discontinued     2 g 200 mL/hr over 30 Minutes Intravenous  Once 12/06/18 2141 12/07/18 1514      Medications   Scheduled Meds: . allopurinol  100 mg Oral Daily  . amitriptyline  12.5 mg Oral QHS  . aspirin EC  81 mg Oral Daily  . atorvastatin  20 mg Oral Daily  . clopidogrel  75 mg Oral Daily  . dextrose  50 mL Intravenous Once  . DULoxetine  30 mg Oral BH-[id number]  . enoxaparin (LOVENOX) injection  30 mg Subcutaneous Q24H  . feeding supplement (ENSURE ENLIVE)  237 mL Oral BID BM  . feeding supplement (PRO-STAT SUGAR  FREE 64)  30 mL Oral QHS  . furosemide  40 mg Intravenous Q12H  . gabapentin  400 mg Oral BID  . levothyroxine  25 mcg Oral Q0600  . metoprolol tartrate  200 mg Oral BID  . [START ON 12/09/2018] multivitamin with minerals  1 tablet Oral Daily  . pantoprazole  40 mg Oral Daily  . potassium chloride  40 mEq Oral Once  . sodium chloride flush  3 mL Intravenous Q12H  . topiramate  25 mg Oral Q2000   Continuous Infusions: PRN Meds:.acetaminophen **OR** acetaminophen, bisacodyl, HYDROmorphone (DILAUDID) injection, ondansetron **OR** ondansetron (ZOFRAN) IV   Data Review:   Micro Results Recent Results (from the past 240 hour(s))  Respiratory Panel by PCR     Status: None   Collection Time: 12/07/18  1:44 PM  Result Value Ref Range Status   Adenovirus NOT DETECTED NOT DETECTED Final   Coronavirus 229E NOT DETECTED NOT DETECTED Final    Comment: (NOTE) The Coronavirus on the Respiratory Panel, DOES NOT test for the novel  Coronavirus (2019 nCoV)    Coronavirus HKU1 NOT DETECTED NOT DETECTED Final   Coronavirus NL63 NOT DETECTED NOT DETECTED Final   Coronavirus OC43 NOT DETECTED NOT DETECTED Final   Metapneumovirus NOT DETECTED NOT DETECTED Final   Rhinovirus / Enterovirus NOT DETECTED NOT DETECTED Final   Influenza A NOT DETECTED NOT DETECTED Final   Influenza B NOT DETECTED NOT DETECTED Final   Parainfluenza Virus 1 NOT DETECTED NOT DETECTED Final   Parainfluenza Virus 2 NOT DETECTED NOT DETECTED Final   Parainfluenza Virus 3 NOT DETECTED NOT DETECTED Final   Parainfluenza Virus 4 NOT DETECTED NOT DETECTED Final   Respiratory Syncytial Virus NOT DETECTED NOT DETECTED Final   Bordetella pertussis NOT DETECTED NOT DETECTED Final   Chlamydophila pneumoniae NOT DETECTED NOT DETECTED Final   Mycoplasma pneumoniae NOT DETECTED NOT DETECTED Final    Comment: Performed at [facility], [place] [address] [place], [name] [address]  SARS Coronavirus 2 [facility] order, Performed in [facility] hospital lab)     Status: None   Collection Time: 12/07/18  3:26 PM  Result Value Ref Range Status   SARS Coronavirus 2 NEGATIVE NEGATIVE Final    Comment: (NOTE) If result is NEGATIVE SARS-CoV-2 target nucleic acids are NOT DETECTED. The SARS-CoV-2 RNA is generally detectable in upper and lower  respiratory specimens during the acute phase of infection. The lowest  concentration of SARS-CoV-2 viral copies this assay can detect is 250  copies / mL. A negative result does not preclude SARS-CoV-2 infection  and should not be used as the sole basis for treatment or other  patient management decisions.  A negative result may occur with  improper specimen collection / handling, submission of specimen other  than nasopharyngeal swab, presence of viral mutation(s) within the  areas targeted by this assay, and inadequate number of viral copies  (<250 copies / mL). A negative result must be combined with clinical  observations, patient history, and epidemiological information. If result is POSITIVE SARS-CoV-2 target nucleic acids are DETECTED. The SARS-CoV-2 RNA is generally detectable in upper and lower  respiratory specimens dur ing the acute phase of infection.  Positive  results are indicative of active infection with SARS-CoV-2.  Clinical  correlation with patient history and other diagnostic information is  necessary to determine patient infection status.  Positive results do  not rule out bacterial infection or co-infection with other viruses. If result is PRESUMPTIVE POSTIVE SARS-CoV-2 nucleic acids MAY BE PRESENT.   A presumptive positive result was obtained on the submitted specimen  and confirmed on repeat testing.  While 2019 novel coronavirus  (SARS-CoV-2) nucleic acids may be present in the submitted sample  additional confirmatory testing may be necessary for epidemiological  and / or clinical management purposes  to differentiate between  SARS-CoV-2 and other  Sarbecovirus currently known to infect humans.  If clinically indicated additional testing with an alternate test  methodology [phone]) is advised. The SARS-CoV-2 RNA is generally  detectable in upper and lower respiratory sp ecimens during the acute  phase of infection. The expected result is Negative. Fact Sheet for Patients:  StrictlyIdeas.no Fact Sheet for Healthcare Providers: BankingDealers.co.za This test is not yet approved or cleared by the [place] FDA and has been authorized for detection and/or diagnosis of SARS-CoV-2 by FDA under an Emergency Use Authorization (EUA).  This EUA will remain in effect (meaning this test can be used) for the duration of the COVID-19 declaration under Section 564(b)(1) of the Act, 21 U.S.C. section 360bbb-3(b)(1), unless the authorization is terminated or revoked sooner. Performed at [facility], [address] [place], [place] [address]   SARS Coronavirus 2 [facility] order, Performed in [place] hospital lab)     Status: None   Collection Time: 12/07/18  6:39 PM  Result Value Ref Range Status   SARS Coronavirus 2 NEGATIVE NEGATIVE Final    Comment: (NOTE) If result is NEGATIVE SARS-CoV-2 target nucleic acids are NOT DETECTED. The SARS-CoV-2 RNA is generally detectable in upper and lower  respiratory specimens during the acute phase of infection. The lowest  concentration of SARS-CoV-2 viral copies this assay can detect is 250  copies / mL. A negative result does not preclude SARS-CoV-2 infection  and should not be used as the sole basis for treatment or other  patient management decisions.  A negative result may occur with  improper specimen collection / handling, submission of specimen other  than nasopharyngeal swab, presence of viral mutation(s) within  the  areas targeted by this assay, and inadequate number of viral copies  (<250 copies / mL). A negative result must be  combined with clinical  observations, patient history, and epidemiological information. If result is POSITIVE SARS-CoV-2 target nucleic acids are DETECTED. The SARS-CoV-2 RNA is generally detectable in upper and lower  respiratory specimens dur ing the acute phase of infection.  Positive  results are indicative of active infection with SARS-CoV-2.  Clinical  correlation with patient history and other diagnostic information is  necessary to determine patient infection status.  Positive results do  not rule out bacterial infection or co-infection with other viruses. If result is PRESUMPTIVE POSTIVE SARS-CoV-2 nucleic acids MAY BE PRESENT.   A presumptive positive result was obtained on the submitted specimen  and confirmed on repeat testing.  While 2019 novel coronavirus  (SARS-CoV-2) nucleic acids may be present in the submitted sample  additional confirmatory testing may be necessary for epidemiological  and / or clinical management purposes  to differentiate between  SARS-CoV-2 and other Sarbecovirus currently known to infect humans.  If clinically indicated additional testing with an alternate test  methodology [phone]) is advised. The SARS-CoV-2 RNA is generally  detectable in upper and lower respiratory sp ecimens during the acute  phase of infection. The expected result is Negative. Fact Sheet for Patients:  StrictlyIdeas.no Fact Sheet for Healthcare Providers: BankingDealers.co.za This test is not yet approved or cleared by the [place] FDA and has been authorized for detection and/or diagnosis of SARS-CoV-2 by FDA under an Emergency Use Authorization (EUA).  This EUA will remain in effect (meaning this test can be used) for the duration of the COVID-19 declaration under Section 564(b)(1) of the Act, 21 U.S.C. section 360bbb-3(b)(1), unless the authorization is terminated or revoked sooner. Performed at [facility], [address] [place], [place] [address]   MRSA PCR Screening     Status: None   Collection Time: 12/07/18 10:40 PM  Result Value Ref Range Status   MRSA by PCR NEGATIVE NEGATIVE Final    Comment:        The GeneXpert MRSA Assay (FDA approved for NASAL specimens only), is one component of a comprehensive MRSA colonization surveillance program. It is not intended to diagnose MRSA infection nor to guide or monitor treatment for MRSA infections. Performed at [facility], [address] [place], [place] [address]     Radiology Reports Dg Chest [place] 1 View  Result Date: 12/07/2018 CLINICAL DATA:  Shortness of breath EXAM: PORTABLE CHEST 1 VIEW COMPARISON:  October 09, 2018 FINDINGS: There is no appreciable edema or consolidation. There is cardiomegaly with pulmonary vascularity within normal limits. No adenopathy. There is aortic atherosclerosis. No bone lesions. IMPRESSION: Stable cardiac enlargement. No edema or consolidation. Aortic Atherosclerosis (ICD10-I70.0). Electronically Signed   By: Lowella Grip III M.D.   On: 12/07/2018 14:08     CBC Recent Labs  Lab 12/07/18 1830 12/07/18 2218 12/08/18 0320  WBC 6.1 7.1 7.5  HGB 11.2* 12.2 12.3  HCT 38.5 40.5 39.4  PLT 162 231 223  MCV 96.0 91.6 89.5  MCH 27.9 27.6 28.0  MCHC 29.1* 30.1 31.2  RDW 19.6* 19.4* 19.3*    Chemistries  Recent Labs  Lab 12/07/18 1107 12/07/18 1226 12/07/18 1830 12/07/18 2219 12/08/18 0320 12/08/18 0724  NA 136 135  --   --  136  --   K 3.9 4.1  --   --  3.2*  --   CL  101 104  --   --  104  --   CO2 23 21*  --   --  21*  --   GLUCOSE 89 141* 163* 101* 80 80  BUN 34* 33*  --   --  33*  --   CREATININE 1.74* 1.66* 1.35*  --  1.54*  --   CALCIUM 9.7 9.2  --   --  9.3  --    ------------------------------------------------------------------------------------------------------------------ estimated creatinine clearance is 25.6 mL/min (A) (by C-G formula based on SCr  of 1.54 mg/dL (H)). ------------------------------------------------------------------------------------------------------------------ No results for input(s): HGBA1C in the last 72 hours. ------------------------------------------------------------------------------------------------------------------ No results for input(s): CHOL, HDL, LDLCALC, TRIG, CHOLHDL, LDLDIRECT in the last 72 hours. ------------------------------------------------------------------------------------------------------------------ No results for input(s): TSH, T4TOTAL, T3FREE, THYROIDAB in the last 72 hours.  Invalid input(s): FREET3 ------------------------------------------------------------------------------------------------------------------ No results for input(s): VITAMINB12, FOLATE, FERRITIN, TIBC, IRON, RETICCTPCT in the last 72 hours.  Coagulation profile No results for input(s): INR, PROTIME in the last 168 hours.  No results for input(s): DDIMER in the last 72 hours.  Cardiac Enzymes No results for input(s): CKMB, TROPONINI, MYOGLOBIN in the last 168 hours.  Invalid input(s): CK ------------------------------------------------------------------------------------------------------------------ Invalid input(s): POCBNP    Assessment & Plan   85 year old female patient history of peripheral artery disease, neuropathy, GERD for leg angiogram by vascular development of hypoxia medicine is consulted for evaluation. 1.  Acute respiratory failure with hypoxia, this is due to acute CHF now improved 2.    Acute on chronic diastolic CHF exacerbation continue Lasix patient has good urine output 3.    Hypoglycemia likely due to poor p.o. intake blood sugars are currently stable 4.  Previous history of stroke continue Plavix and aspirin 5.  Hyperlipidemia continue Lipitor 6.  Hypothyroidism continue Synthroid 7.  Vascular dementia currently stable 8.  Peripheral vascular disease per vascular surgery plan for  arteriogram on Friday 9.  CODE STATUS DNR      Code Status Orders  (From admission, onward)         Start     Ordered   12/07/18 1508  Do not attempt resuscitation (DNR)  Continuous    Question Answer Comment  In the event of cardiac or respiratory ARREST Do not call a "code blue"   In the event of cardiac or respiratory ARREST Do not perform Intubation, CPR, defibrillation or ACLS   In the event of cardiac or respiratory ARREST Use medication by any route, position, wound care, and other measures to relive pain and suffering. May use oxygen, suction and manual treatment of airway obstruction as needed for comfort.   Comments NMP      12/07/18 1508        Code Status History    Date Active Date Inactive Code Status Order ID Comments User Context   12/07/2018 1508 12/07/2018 1508 Full Code [id number]  Epifanio Lesches, MD Inpatient   10/09/2018 1701 10/13/2018 1736 DNR [id number]  Henreitta Leber, MD Inpatient   05/18/2018 1259 05/18/2018 1739 Full Code [id number]  Katha Cabal, MD Inpatient   02/23/2018 1201 02/23/2018 1644 Full Code [id number]  Katha Cabal, MD Inpatient   12/09/2017 1646 12/11/2017 1850 DNR [id number]  Demetrios Loll, MD Inpatient   05/03/2017 0349 05/04/2017 2209 Full Code [id number]  Saundra Shelling, MD Inpatient   10/07/2016 1203 10/07/2016 1738 Full Code [id number]  Delana Meyer Dolores Lory, MD Inpatient   09/16/2016 1027 09/16/2016 1711 Full Code [id number]  Delana Meyer Dolores Lory, MD Inpatient   09/02/2016 1051 09/02/2016  1601 Full Code [id number]  Katha Cabal, MD Inpatient   07/22/2016 1002 07/22/2016 2318 DNR [id number]  Fritzi Mandes, MD Inpatient   07/20/2016 1455 07/22/2016 1002 Full Code [id number]  Hillary Bow, MD ED    Advance Directive Documentation     Most Recent Value  Type of Advance Directive  Healthcare Power of Attorney  Pre-existing out of facility DNR order (yellow form or pink MOST form)  -  "MOST" Form in Place?  -           Consults  vascular  DVT Prophylaxis  Lovenox   Lab Results  Component Value Date   PLT 223 12/08/2018     Time Spent in minutes 43min Greater than 50% of time spent in care coordination and counseling patient regarding the condition and plan of care.   Dustin Flock M.D on 12/08/2018 at 11:12 AM  Between 7am to 6pm - Pager - [phone]  After 6pm go to [email or web address] - Proofreader  [employer]   Office  [phone]

## 2018-12-08 NOTE — Progress Notes (Signed)
Initial Nutrition Assessment  DOCUMENTATION CODES:   Not applicable  INTERVENTION:   Ensure Enlive po BID, each supplement provides 350 kcal and 20 grams of protein  MVI daily  2 gram sodium diet   NUTRITION DIAGNOSIS:   Inadequate oral intake related to acute illness as evidenced by (per chart review).  GOAL:   Patient will meet greater than or equal to 90% of their needs  MONITOR:   PO intake, Supplement acceptance, Labs, Weight trends, I & O's, Skin  REASON FOR ASSESSMENT:   Malnutrition Screening Tool    ASSESSMENT:   85 year old female patient history of peripheral artery disease, neuropathy, GERD admitted for leg angiogram by vascular and developed hypoxia   RD working remotely.  RD familiar with this pt from recent previous admits. Pt with poor appetite and oral intake at baseline. RD will add supplements and MVI to help pt meet her estimated needs. RD will also liberalize heart healthy diet as this is restrictive of protein. It is difficult to determine if pt has had any true weight loss r/t weight fluctuations from fluid. Suspect pt at moderate refeed risk; recommend monitor K, Mg and P labs daily until stable.   Medications reviewed and include: allopurinol, aspirin, plavix, lovenox, lasix, synthroid, KCl  Labs reviewed: K 3.2(L), BUN 33(H), creat 1.54(H) BNP- 241(H)- 4/21 cbgs- 41, 66, 31, 45, 75 X 24 hrs  Unable to complete Nutrition-Focused physical exam at this time.   Diet Order:   Diet Order            Diet Heart Room service appropriate? Yes; Fluid consistency: Thin  Diet effective now             EDUCATION NEEDS:   No education needs have been identified at this time  Skin:  Skin Assessment: Reviewed RN Assessment(unstageable pressure wound toe)  Last BM:  pta  Height:   Ht Readings from Last 1 Encounters:  12/07/18 5\' 6"  (1.676 m)    Weight:   Wt Readings from Last 1 Encounters:  12/08/18 75.1 kg    Ideal Body Weight:  59  kg  BMI:  Body mass index is 26.71 kg/m.  Estimated Nutritional Needs:   Kcal:  1400-1600kcal/day   Protein:  75-83g/day   Fluid:  >1.4L/day   Koleen Distance MS, RD, LDN Pager #- [phone] Office#- [phone] After Hours Pager: [phone]

## 2018-12-08 NOTE — Progress Notes (Signed)
She finally allowed me to give her her meds.  When complete she put the sheet over her head - a clear signal to go away.

## 2018-12-08 NOTE — Progress Notes (Signed)
[facility] Communication Note   Will plan on a right lower extremity angiogram with possible intervention with Dr. Delana Meyer on Friday 12/10/18 Will pre-op on Thursday  Discussed with Dr. Eber Hong Jeniffer Culliver PA-C 12/08/2018 12:13 PM

## 2018-12-08 NOTE — Progress Notes (Signed)
Patient refuses to take medications at this time.  Says  She'll "get up after awhile and will take her pills".

## 2018-12-08 NOTE — Progress Notes (Signed)
Still refuses to take her medications.

## 2018-12-08 NOTE — Progress Notes (Signed)
She has slept all day.  She refused her meals.  Tells me she's not sure why she is so sleepy.

## 2018-12-09 ENCOUNTER — Ambulatory Visit: Payer: Medicare Other | Admitting: Family

## 2018-12-09 ENCOUNTER — Other Ambulatory Visit (INDEPENDENT_AMBULATORY_CARE_PROVIDER_SITE_OTHER): Payer: Self-pay | Admitting: Vascular Surgery

## 2018-12-09 LAB — CBC WITH DIFFERENTIAL/PLATELET
Abs Immature Granulocytes: 0.04 10*3/uL (ref 0.00–0.07)
Basophils Absolute: 0 10*3/uL (ref 0.0–0.1)
Basophils Relative: 0 %
Eosinophils Absolute: 0.2 10*3/uL (ref 0.0–0.5)
Eosinophils Relative: 3 %
HCT: 39.9 % (ref 36.0–46.0)
Hemoglobin: 12.3 g/dL (ref 12.0–15.0)
Immature Granulocytes: 1 %
Lymphocytes Relative: 34 %
Lymphs Abs: 2.7 10*3/uL (ref 0.7–4.0)
MCH: 28.1 pg (ref 26.0–34.0)
MCHC: 30.8 g/dL (ref 30.0–36.0)
MCV: 91.1 fL (ref 80.0–100.0)
Monocytes Absolute: 0.4 10*3/uL (ref 0.1–1.0)
Monocytes Relative: 5 %
Neutro Abs: 4.5 10*3/uL (ref 1.7–7.7)
Neutrophils Relative %: 57 %
Platelets: 243 10*3/uL (ref 150–400)
RBC: 4.38 MIL/uL (ref 3.87–5.11)
RDW: 19.6 % — ABNORMAL HIGH (ref 11.5–15.5)
WBC: 7.9 10*3/uL (ref 4.0–10.5)
nRBC: 0 % (ref 0.0–0.2)

## 2018-12-09 LAB — BASIC METABOLIC PANEL
Anion gap: 12 (ref 5–15)
BUN: 29 mg/dL — ABNORMAL HIGH (ref 8–23)
CO2: 21 mmol/L — ABNORMAL LOW (ref 22–32)
Calcium: 9.3 mg/dL (ref 8.9–10.3)
Chloride: 105 mmol/L (ref 98–111)
Creatinine, Ser: 1.37 mg/dL — ABNORMAL HIGH (ref 0.44–1.00)
GFR calc Af Amer: 40 mL/min — ABNORMAL LOW (ref >60)
GFR calc non Af Amer: 34 mL/min — ABNORMAL LOW (ref >60)
Glucose, Bld: 80 mg/dL (ref 70–99)
Potassium: 3.4 mmol/L — ABNORMAL LOW (ref 3.5–5.1)
Sodium: 138 mmol/L (ref 135–145)

## 2018-12-09 LAB — GLUCOSE, CAPILLARY
Glucose-Capillary: 30 mg/dL — CL (ref 70–99)
Glucose-Capillary: 57 mg/dL — ABNORMAL LOW (ref 70–99)
Glucose-Capillary: 63 mg/dL — ABNORMAL LOW (ref 70–99)
Glucose-Capillary: 90 mg/dL (ref 70–99)

## 2018-12-09 MED ORDER — SODIUM CHLORIDE 0.9 % IV SOLN
INTRAVENOUS | Status: DC
  Administered 2018-12-10: 01:00:00 via INTRAVENOUS

## 2018-12-09 MED ORDER — CEFAZOLIN SODIUM-DEXTROSE 1-4 GM/50ML-% IV SOLN
1.0000 g | INTRAVENOUS | Status: AC
  Administered 2018-12-10: 1 g via INTRAVENOUS
  Filled 2018-12-09: qty 50

## 2018-12-09 MED ORDER — POTASSIUM CHLORIDE CRYS ER 20 MEQ PO TBCR
40.0000 meq | EXTENDED_RELEASE_TABLET | Freq: Once | ORAL | Status: AC
  Administered 2018-12-09: 40 meq via ORAL
  Filled 2018-12-09: qty 2

## 2018-12-09 MED ORDER — GLUCOSE 4 G PO CHEW
1.0000 | CHEWABLE_TABLET | Freq: Three times a day (TID) | ORAL | Status: DC
  Administered 2018-12-09 – 2018-12-11 (×5): 4 g via ORAL
  Filled 2018-12-09 (×2): qty 1

## 2018-12-09 NOTE — Progress Notes (Signed)
[place] [facility]                                                                                                                                                                                  Patient Demographics   [name], is a 85 y.o. female, DOB - [birth year], BHA:[id number]  Admit date - 12/07/2018   Admitting Physician Epifanio Lesches, MD  Outpatient Primary MD for the patient is Philis Fendt, Morene Antu, NP   LOS - 2  Subjective: Patient doing better waiting for vascular procedure tomorrow  Review of Systems:   CONSTITUTIONAL: No documented fever. No fatigue, weakness. No weight gain, no weight loss.  EYES: No blurry or double vision.  ENT: No tinnitus. No postnasal drip. No redness of the oropharynx.  RESPIRATORY: No cough, no wheeze, no hemoptysis. No dyspnea.  CARDIOVASCULAR: No chest pain. No orthopnea. No palpitations. No syncope.  GASTROINTESTINAL: No nausea, no vomiting or diarrhea. No abdominal pain. No melena or hematochezia.  GENITOURINARY: No dysuria or hematuria.  ENDOCRINE: No polyuria or nocturia. No heat or cold intolerance.  HEMATOLOGY: No anemia. No bruising. No bleeding.  INTEGUMENTARY: No rashes. No lesions.  MUSCULOSKELETAL: No arthritis. No swelling. No gout.  NEUROLOGIC: No numbness, tingling, or ataxia. No seizure-type activity.  PSYCHIATRIC: No anxiety. No insomnia. No ADD.    Vitals:   Vitals:   12/08/18 1922 12/09/18 0506 12/09/18 0546 12/09/18 0721  BP: (!) 111/95 124/69  133/66  Pulse: 83 76  69  Resp:      Temp: 98.9 F (37.2 C) 98.2 F (36.8 C)  98.5 F (36.9 C)  TempSrc: Oral Oral  Oral  SpO2: 94%     Weight:   74.5 kg   Height:        Wt Readings from Last 3 Encounters:  12/09/18 74.5 kg  10/13/18 100.1 kg  09/02/18 95.3 kg     Intake/Output Summary (Last 24 hours) at 12/09/2018 1020 Last data filed at 12/08/2018 2153 Gross per 24 hour  Intake -  Output 700 ml  Net -700 ml     Physical Exam:   GENERAL: Pleasant-appearing in no apparent distress.  HEAD, EYES, EARS, NOSE AND THROAT: Atraumatic, normocephalic. Extraocular muscles are intact. Pupils equal and reactive to light. Sclerae anicteric. No conjunctival injection. No oro-pharyngeal erythema.  NECK: Supple. There is no jugular venous distention. No bruits, no lymphadenopathy, no thyromegaly.  HEART: Regular rate and rhythm,. No murmurs, no rubs, no clicks.  LUNGS: Clear to auscultation bilaterally. No rales or rhonchi. No wheezes.  ABDOMEN: Soft, flat, nontender, nondistended. Has good bowel sounds. No hepatosplenomegaly appreciated.  EXTREMITIES: No evidence of any cyanosis,  clubbing, or peripheral edema.  +2 pedal and radial pulses bilaterally.  NEUROLOGIC: The patient is alert, awake, and oriented x3 with no focal motor or sensory deficits appreciated bilaterally.  SKIN: Moist and warm with no rashes appreciated.  Psych: Not anxious, depressed LN: No inguinal LN enlargement    Antibiotics   Anti-infectives (From admission, onward)   Start     Dose/Rate Route Frequency Ordered Stop   12/07/18 1046  ceFAZolin (ANCEF) 2-4 GM/100ML-% IVPB    Note to Pharmacy:  Despina Arias  : cabinet override      12/07/18 1046 12/07/18 2259   12/06/18 2145  ceFAZolin (ANCEF) IVPB 2g/100 mL premix  Status:  Discontinued     2 g 200 mL/hr over 30 Minutes Intravenous  Once 12/06/18 2141 12/07/18 1514      Medications   Scheduled Meds: . allopurinol  100 mg Oral Daily  . amitriptyline  12.5 mg Oral QHS  . aspirin EC  81 mg Oral Daily  . atorvastatin  20 mg Oral Daily  . clopidogrel  75 mg Oral Daily  . dextrose  50 mL Intravenous Once  . DULoxetine  30 mg Oral BH-q7a  . enoxaparin (LOVENOX) injection  30 mg Subcutaneous Q24H  . feeding supplement (ENSURE ENLIVE)  237 mL Oral BID BM  . feeding supplement (PRO-STAT SUGAR FREE 64)  30 mL Oral QHS  . furosemide  40 mg Intravenous Q12H  . gabapentin  400 mg  Oral BID  . latanoprost  1 drop Both Eyes QHS  . levothyroxine  25 mcg Oral Q0600  . metoprolol tartrate  200 mg Oral BID  . multivitamin with minerals  1 tablet Oral Daily  . pantoprazole  40 mg Oral Daily  . potassium chloride  40 mEq Oral Once  . sodium chloride flush  3 mL Intravenous Q12H  . topiramate  25 mg Oral Q2000   Continuous Infusions: PRN Meds:.acetaminophen **OR** acetaminophen, bisacodyl, ondansetron **OR** ondansetron (ZOFRAN) IV   Data Review:   Micro Results Recent Results (from the past 240 hour(s))  Respiratory Panel by PCR     Status: None   Collection Time: 12/07/18  1:44 PM  Result Value Ref Range Status   Adenovirus NOT DETECTED NOT DETECTED Final   Coronavirus 229E NOT DETECTED NOT DETECTED Final    Comment: (NOTE) The Coronavirus on the Respiratory Panel, DOES NOT test for the novel  Coronavirus (2019 nCoV)    Coronavirus HKU1 NOT DETECTED NOT DETECTED Final   Coronavirus NL63 NOT DETECTED NOT DETECTED Final   Coronavirus OC43 NOT DETECTED NOT DETECTED Final   Metapneumovirus NOT DETECTED NOT DETECTED Final   Rhinovirus / Enterovirus NOT DETECTED NOT DETECTED Final   Influenza A NOT DETECTED NOT DETECTED Final   Influenza B NOT DETECTED NOT DETECTED Final   Parainfluenza Virus 1 NOT DETECTED NOT DETECTED Final   Parainfluenza Virus 2 NOT DETECTED NOT DETECTED Final   Parainfluenza Virus 3 NOT DETECTED NOT DETECTED Final   Parainfluenza Virus 4 NOT DETECTED NOT DETECTED Final   Respiratory Syncytial Virus NOT DETECTED NOT DETECTED Final   Bordetella pertussis NOT DETECTED NOT DETECTED Final   Chlamydophila pneumoniae NOT DETECTED NOT DETECTED Final   Mycoplasma pneumoniae NOT DETECTED NOT DETECTED Final    Comment: Performed at [facility], [place] [address] [place], [place] [address]  SARS Coronavirus 2 [facility] order, Performed in [facility] hospital lab)     Status: None   Collection Time: 12/07/18  3:26  PM  Result Value Ref Range  Status   SARS Coronavirus 2 NEGATIVE NEGATIVE Final    Comment: (NOTE) If result is NEGATIVE SARS-CoV-2 target nucleic acids are NOT DETECTED. The SARS-CoV-2 RNA is generally detectable in upper and lower  respiratory specimens during the acute phase of infection. The lowest  concentration of SARS-CoV-2 viral copies this assay can detect is 250  copies / mL. A negative result does not preclude SARS-CoV-2 infection  and should not be used as the sole basis for treatment or other  patient management decisions.  A negative result may occur with  improper specimen collection / handling, submission of specimen other  than nasopharyngeal swab, presence of viral mutation(s) within the  areas targeted by this assay, and inadequate number of viral copies  (<250 copies / mL). A negative result must be combined with clinical  observations, patient history, and epidemiological information. If result is POSITIVE SARS-CoV-2 target nucleic acids are DETECTED. The SARS-CoV-2 RNA is generally detectable in upper and lower  respiratory specimens dur ing the acute phase of infection.  Positive  results are indicative of active infection with SARS-CoV-2.  Clinical  correlation with patient history and other diagnostic information is  necessary to determine patient infection status.  Positive results do  not rule out bacterial infection or co-infection with other viruses. If result is PRESUMPTIVE POSTIVE SARS-CoV-2 nucleic acids MAY BE PRESENT.   A presumptive positive result was obtained on the submitted specimen  and confirmed on repeat testing.  While 2019 novel coronavirus  (SARS-CoV-2) nucleic acids may be present in the submitted sample  additional confirmatory testing may be necessary for epidemiological  and / or clinical management purposes  to differentiate between  SARS-CoV-2 and other Sarbecovirus currently known to infect humans.  If clinically indicated additional testing with an alternate  test  methodology [phone]) is advised. The SARS-CoV-2 RNA is generally  detectable in upper and lower respiratory sp ecimens during the acute  phase of infection. The expected result is Negative. Fact Sheet for Patients:  StrictlyIdeas.no Fact Sheet for Healthcare Providers: BankingDealers.co.za This test is not yet approved or cleared by the [place] FDA and has been authorized for detection and/or diagnosis of SARS-CoV-2 by FDA under an Emergency Use Authorization (EUA).  This EUA will remain in effect (meaning this test can be used) for the duration of the COVID-19 declaration under Section 564(b)(1) of the Act, 21 U.S.C. section 360bbb-3(b)(1), unless the authorization is terminated or revoked sooner. Performed at [facility], [address] [place], [place] [address]   SARS Coronavirus 2 [facility] order, Performed in [place] hospital lab)     Status: None   Collection Time: 12/07/18  6:39 PM  Result Value Ref Range Status   SARS Coronavirus 2 NEGATIVE NEGATIVE Final    Comment: (NOTE) If result is NEGATIVE SARS-CoV-2 target nucleic acids are NOT DETECTED. The SARS-CoV-2 RNA is generally detectable in upper and lower  respiratory specimens during the acute phase of infection. The lowest  concentration of SARS-CoV-2 viral copies this assay can detect is 250  copies / mL. A negative result does not preclude SARS-CoV-2 infection  and should not be used as the sole basis for treatment or other  patient management decisions.  A negative result may occur with  improper specimen collection / handling, submission of specimen other  than nasopharyngeal swab, presence of viral mutation(s) within the  areas targeted by this assay, and inadequate number of viral copies  (<250  copies / mL). A negative result must be combined with clinical  observations, patient history, and epidemiological information. If result is  POSITIVE SARS-CoV-2 target nucleic acids are DETECTED. The SARS-CoV-2 RNA is generally detectable in upper and lower  respiratory specimens dur ing the acute phase of infection.  Positive  results are indicative of active infection with SARS-CoV-2.  Clinical  correlation with patient history and other diagnostic information is  necessary to determine patient infection status.  Positive results do  not rule out bacterial infection or co-infection with other viruses. If result is PRESUMPTIVE POSTIVE SARS-CoV-2 nucleic acids MAY BE PRESENT.   A presumptive positive result was obtained on the submitted specimen  and confirmed on repeat testing.  While 2019 novel coronavirus  (SARS-CoV-2) nucleic acids may be present in the submitted sample  additional confirmatory testing may be necessary for epidemiological  and / or clinical management purposes  to differentiate between  SARS-CoV-2 and other Sarbecovirus currently known to infect humans.  If clinically indicated additional testing with an alternate test  methodology [phone]) is advised. The SARS-CoV-2 RNA is generally  detectable in upper and lower respiratory sp ecimens during the acute  phase of infection. The expected result is Negative. Fact Sheet for Patients:  StrictlyIdeas.no Fact Sheet for Healthcare Providers: BankingDealers.co.za This test is not yet approved or cleared by the [place] FDA and has been authorized for detection and/or diagnosis of SARS-CoV-2 by FDA under an Emergency Use Authorization (EUA).  This EUA will remain in effect (meaning this test can be used) for the duration of the COVID-19 declaration under Section 564(b)(1) of the Act, 21 U.S.C. section 360bbb-3(b)(1), unless the authorization is terminated or revoked sooner. Performed at [facility], [address] [place], [place] [address]   MRSA PCR Screening     Status: None   Collection  Time: 12/07/18 10:40 PM  Result Value Ref Range Status   MRSA by PCR NEGATIVE NEGATIVE Final    Comment:        The GeneXpert MRSA Assay (FDA approved for NASAL specimens only), is one component of a comprehensive MRSA colonization surveillance program. It is not intended to diagnose MRSA infection nor to guide or monitor treatment for MRSA infections. Performed at [facility], [address] [place], [place] [address]     Radiology Reports Dg Chest [place] 1 View  Result Date: 12/07/2018 CLINICAL DATA:  Shortness of breath EXAM: PORTABLE CHEST 1 VIEW COMPARISON:  October 09, 2018 FINDINGS: There is no appreciable edema or consolidation. There is cardiomegaly with pulmonary vascularity within normal limits. No adenopathy. There is aortic atherosclerosis. No bone lesions. IMPRESSION: Stable cardiac enlargement. No edema or consolidation. Aortic Atherosclerosis (ICD10-I70.0). Electronically Signed   By: Lowella Grip III M.D.   On: 12/07/2018 14:08     CBC Recent Labs  Lab 12/07/18 1830 12/07/18 2218 12/08/18 0320 12/09/18 0358  WBC 6.1 7.1 7.5 7.9  HGB 11.2* 12.2 12.3 12.3  HCT 38.5 40.5 39.4 39.9  PLT 162 231 223 243  MCV 96.0 91.6 89.5 91.1  MCH 27.9 27.6 28.0 28.1  MCHC 29.1* 30.1 31.2 30.8  RDW 19.6* 19.4* 19.3* 19.6*  LYMPHSABS  --   --   --  2.7  MONOABS  --   --   --  0.4  EOSABS  --   --   --  0.2  BASOSABS  --   --   --  0.0    Chemistries  Recent Labs  Lab 12/07/18 1107 12/07/18 1226 12/07/18 1830 12/07/18 2219 12/08/18 0320 12/08/18 0724 12/09/18 0358  NA 136 135  --   --  136  --  138  K 3.9 4.1  --   --  3.2*  --  3.4*  CL 101 104  --   --  104  --  105  CO2 23 21*  --   --  21*  --  21*  GLUCOSE 89 141* 163* 101* 80 80 80  BUN 34* 33*  --   --  33*  --  29*  CREATININE 1.74* 1.66* 1.35*  --  1.54*  --  1.37*  CALCIUM 9.7 9.2  --   --  9.3  --  9.3    ------------------------------------------------------------------------------------------------------------------ estimated creatinine clearance is 28.7 mL/min (A) (by C-G formula based on SCr of 1.37 mg/dL (H)). ------------------------------------------------------------------------------------------------------------------ No results for input(s): HGBA1C in the last 72 hours. ------------------------------------------------------------------------------------------------------------------ No results for input(s): CHOL, HDL, LDLCALC, TRIG, CHOLHDL, LDLDIRECT in the last 72 hours. ------------------------------------------------------------------------------------------------------------------ No results for input(s): TSH, T4TOTAL, T3FREE, THYROIDAB in the last 72 hours.  Invalid input(s): FREET3 ------------------------------------------------------------------------------------------------------------------ No results for input(s): VITAMINB12, FOLATE, FERRITIN, TIBC, IRON, RETICCTPCT in the last 72 hours.  Coagulation profile No results for input(s): INR, PROTIME in the last 168 hours.  No results for input(s): DDIMER in the last 72 hours.  Cardiac Enzymes No results for input(s): CKMB, TROPONINI, MYOGLOBIN in the last 168 hours.  Invalid input(s): CK ------------------------------------------------------------------------------------------------------------------ Invalid input(s): POCBNP    Assessment & Plan   85 year old female patient history of peripheral artery disease, neuropathy, GERD for leg angiogram by vascular development of hypoxia medicine is consulted for evaluation. 1.  Acute respiratory failure with hypoxia, this is due to acute CHF now improved 2.    Acute on chronic diastolic CHF exacerbation change to oral Lasix 3.    Hypoglycemia still continues to be low I will check insulin C-peptide levels 4.  Previous history of stroke continue Plavix and aspirin 5.   Hyperlipidemia continue Lipitor 6.  Hypothyroidism continue Synthroid 7.  Vascular dementia currently stable 8.  Peripheral vascular disease per vascular surgery plan for arteriogram on Friday 9.  CODE STATUS DNR      Code Status Orders  (From admission, onward)         Start     Ordered   12/07/18 1508  Do not attempt resuscitation (DNR)  Continuous    Question Answer Comment  In the event of cardiac or respiratory ARREST Do not call a "code blue"   In the event of cardiac or respiratory ARREST Do not perform Intubation, CPR, defibrillation or ACLS   In the event of cardiac or respiratory ARREST Use medication by any route, position, wound care, and other measures to relive pain and suffering. May use oxygen, suction and manual treatment of airway obstruction as needed for comfort.   Comments NMP      12/07/18 1508        Code Status History    Date Active Date Inactive Code Status Order ID Comments User Context   12/07/2018 1508 12/07/2018 1508 Full Code [id number]  Epifanio Lesches, MD Inpatient   10/09/2018 1701 10/13/2018 1736 DNR [id number]  Henreitta Leber, MD Inpatient   05/18/2018 1259 05/18/2018 1739 Full Code [id number]  Katha Cabal, MD Inpatient   02/23/2018 1201 02/23/2018 1644 Full Code [id number]  Katha Cabal, MD Inpatient   12/09/2017 1646 12/11/2017 1850 DNR [id number]  Demetrios Loll, MD  Inpatient   05/03/2017 0349 05/04/2017 2209 Full Code [id number]  Saundra Shelling, MD Inpatient   10/07/2016 1203 10/07/2016 1738 Full Code [id number]  Katha Cabal, MD Inpatient   09/16/2016 1027 09/16/2016 1711 Full Code [id number]  Katha Cabal, MD Inpatient   09/02/2016 1051 09/02/2016 1601 Full Code [id number]  Katha Cabal, MD Inpatient   07/22/2016 1002 07/22/2016 2318 DNR [id number]  Fritzi Mandes, MD Inpatient   07/20/2016 1455 07/22/2016 1002 Full Code [id number]  Hillary Bow, MD ED    Advance Directive Documentation     Most Recent Value  Type of Advance Directive   Healthcare Power of Attorney  Pre-existing out of facility DNR order (yellow form or pink MOST form)  -  "MOST" Form in Place?  -           Consults vascular  DVT Prophylaxis  Lovenox   Lab Results  Component Value Date   PLT 243 12/09/2018     Time Spent in minutes 15min Greater than 50% of time spent in care coordination and counseling patient regarding the condition and plan of care.   Dustin Flock M.D on 12/09/2018 at 10:20 AM  Between 7am to 6pm - Pager - [phone]  After 6pm go to [email or web address] - Proofreader  [employer]   Office  [phone]

## 2018-12-10 ENCOUNTER — Encounter
Admission: AD | Disposition: A | Discharge status: Skilled Nursing Facility | Payer: Self-pay | Source: Home / Self Care | Attending: Internal Medicine

## 2018-12-10 ENCOUNTER — Other Ambulatory Visit (INDEPENDENT_AMBULATORY_CARE_PROVIDER_SITE_OTHER): Payer: Self-pay | Admitting: Nurse Practitioner

## 2018-12-10 DIAGNOSIS — I70202 Unspecified atherosclerosis of native arteries of extremities, left leg: Secondary | ICD-10-CM

## 2018-12-10 DIAGNOSIS — L97919 Non-pressure chronic ulcer of unspecified part of right lower leg with unspecified severity: Secondary | ICD-10-CM

## 2018-12-10 DIAGNOSIS — I70261 Atherosclerosis of native arteries of extremities with gangrene, right leg: Secondary | ICD-10-CM

## 2018-12-10 HISTORY — PX: LOWER EXTREMITY ANGIOGRAPHY: CATH118251

## 2018-12-10 LAB — BASIC METABOLIC PANEL
Anion gap: 12 (ref 5–15)
BUN: 33 mg/dL — ABNORMAL HIGH (ref 8–23)
CO2: 24 mmol/L (ref 22–32)
Calcium: 9.5 mg/dL (ref 8.9–10.3)
Chloride: 103 mmol/L (ref 98–111)
Creatinine, Ser: 1.55 mg/dL — ABNORMAL HIGH (ref 0.44–1.00)
GFR calc Af Amer: 34 mL/min — ABNORMAL LOW (ref >60)
GFR calc non Af Amer: 29 mL/min — ABNORMAL LOW (ref >60)
Glucose, Bld: 92 mg/dL (ref 70–99)
Potassium: 4.4 mmol/L (ref 3.5–5.1)
Sodium: 139 mmol/L (ref 135–145)

## 2018-12-10 LAB — GLUCOSE, CAPILLARY
Glucose-Capillary: 93 mg/dL (ref 70–99)
Glucose-Capillary: 95 mg/dL (ref 70–99)
Glucose-Capillary: 42 mg/dL — CL (ref 70–99)
Glucose-Capillary: 77 mg/dL (ref 70–99)
Glucose-Capillary: 70 mg/dL (ref 70–99)

## 2018-12-10 LAB — INSULIN AND C-PEPTIDE, SERUM
C-Peptide: 7 ng/mL — ABNORMAL HIGH (ref 1.1–4.4)
Insulin: 21.7 u[IU]/mL (ref 2.6–24.9)

## 2018-12-10 LAB — MAGNESIUM: Magnesium: 1.9 mg/dL (ref 1.7–2.4)

## 2018-12-10 LAB — GLUCOSE, RANDOM: Glucose, Bld: 81 mg/dL (ref 70–99)

## 2018-12-10 SURGERY — LOWER EXTREMITY ANGIOGRAPHY
Anesthesia: Moderate Sedation | Laterality: Right

## 2018-12-10 MED ORDER — ONDANSETRON HCL 4 MG/2ML IJ SOLN: 4.0000 mg | Freq: Four times a day (QID) | INTRAMUSCULAR | Status: DC | PRN

## 2018-12-10 MED ORDER — GLUCOSE 4 G PO CHEW: 1.0000 | CHEWABLE_TABLET | Freq: Three times a day (TID) | ORAL | 12 refills | Status: AC

## 2018-12-10 MED ORDER — FAMOTIDINE 20 MG PO TABS: 40.0000 mg | ORAL_TABLET | Freq: Once | ORAL | Status: DC | PRN

## 2018-12-10 MED ORDER — SODIUM CHLORIDE 0.9 % IV SOLN
INTRAVENOUS | Status: DC
  Administered 2018-12-10: 01:00:00 via INTRAVENOUS

## 2018-12-10 MED ORDER — SODIUM CHLORIDE 0.9 % IV SOLN: INTRAVENOUS | Status: DC

## 2018-12-10 MED ORDER — HYDROMORPHONE HCL 1 MG/ML IJ SOLN: 1.0000 mg | Freq: Once | INTRAMUSCULAR | Status: DC | PRN

## 2018-12-10 MED ORDER — FENTANYL CITRATE (PF) 100 MCG/2ML IJ SOLN
INTRAMUSCULAR | Status: AC
  Filled 2018-12-10: qty 2

## 2018-12-10 MED ORDER — DIPHENHYDRAMINE HCL 50 MG/ML IJ SOLN: 50.0000 mg | Freq: Once | INTRAMUSCULAR | Status: DC | PRN

## 2018-12-10 MED ORDER — HEPARIN (PORCINE) IN NACL 1000-0.9 UT/500ML-% IV SOLN
INTRAVENOUS | Status: AC
  Filled 2018-12-10: qty 1000

## 2018-12-10 MED ORDER — FENTANYL CITRATE (PF) 100 MCG/2ML IJ SOLN
INTRAMUSCULAR | Status: DC | PRN
  Administered 2018-12-10 (×3): 25 ug via INTRAVENOUS
  Administered 2018-12-10: 50 ug via INTRAVENOUS

## 2018-12-10 MED ORDER — LIDOCAINE-EPINEPHRINE (PF) 1 %-1:200000 IJ SOLN
INTRAMUSCULAR | Status: AC
  Filled 2018-12-10: qty 30

## 2018-12-10 MED ORDER — HEPARIN SODIUM (PORCINE) 1000 UNIT/ML IJ SOLN
INTRAMUSCULAR | Status: AC
  Filled 2018-12-10: qty 1

## 2018-12-10 MED ORDER — MIDAZOLAM HCL 2 MG/ML PO SYRP
8.0000 mg | ORAL_SOLUTION | Freq: Once | ORAL | Status: DC | PRN
  Filled 2018-12-10: qty 4

## 2018-12-10 MED ORDER — MIDAZOLAM HCL 2 MG/2ML IJ SOLN
INTRAMUSCULAR | Status: DC | PRN
  Administered 2018-12-10 (×3): 0.5 mg via INTRAVENOUS
  Administered 2018-12-10: 1 mg via INTRAVENOUS

## 2018-12-10 MED ORDER — CEFAZOLIN SODIUM-DEXTROSE 2-4 GM/100ML-% IV SOLN: 2.0000 g | Freq: Once | INTRAVENOUS | Status: DC

## 2018-12-10 MED ORDER — IOHEXOL 300 MG/ML  SOLN
INTRAMUSCULAR | Status: DC | PRN
  Administered 2018-12-10: 135 mL via INTRA_ARTERIAL

## 2018-12-10 MED ORDER — SODIUM CHLORIDE 0.9% FLUSH: 3.0000 mL | INTRAVENOUS | Status: DC | PRN

## 2018-12-10 MED ORDER — MIDAZOLAM HCL 2 MG/ML PO SYRP: 8.0000 mg | ORAL_SOLUTION | Freq: Once | ORAL | Status: DC | PRN

## 2018-12-10 MED ORDER — FUROSEMIDE 10 MG/ML IJ SOLN
40.0000 mg | Freq: Two times a day (BID) | INTRAMUSCULAR | Status: DC
  Administered 2018-12-11: 40 mg via INTRAVENOUS
  Filled 2018-12-10: qty 4

## 2018-12-10 MED ORDER — METHYLPREDNISOLONE SODIUM SUCC 125 MG IJ SOLR: 125.0000 mg | Freq: Once | INTRAMUSCULAR | Status: DC | PRN

## 2018-12-10 MED ORDER — MAGNESIUM SULFATE 2 GM/50ML IV SOLN
2.0000 g | Freq: Once | INTRAVENOUS | Status: AC
  Administered 2018-12-10: 2 g via INTRAVENOUS
  Filled 2018-12-10: qty 50

## 2018-12-10 MED ORDER — SODIUM CHLORIDE 0.9 % IV SOLN: 250.0000 mL | INTRAVENOUS | Status: DC | PRN

## 2018-12-10 MED ORDER — MORPHINE SULFATE (PF) 4 MG/ML IV SOLN: 2.0000 mg | INTRAVENOUS | Status: DC | PRN

## 2018-12-10 MED ORDER — HEPARIN (PORCINE) 25000 UT/250ML-% IV SOLN
900.0000 [IU]/h | INTRAVENOUS | Status: DC
  Filled 2018-12-10: qty 250

## 2018-12-10 MED ORDER — SODIUM CHLORIDE 0.9% FLUSH
3.0000 mL | Freq: Two times a day (BID) | INTRAVENOUS | Status: DC
  Administered 2018-12-11: 3 mL via INTRAVENOUS

## 2018-12-10 MED ORDER — MIDAZOLAM HCL 5 MG/5ML IJ SOLN
INTRAMUSCULAR | Status: AC
  Filled 2018-12-10: qty 5

## 2018-12-10 SURGICAL SUPPLY — 30 items
BALLN LUTONIX 018 4X220X130 (BALLOONS) ×3
BALLN LUTONIX DCB 6X60X130 (BALLOONS) ×3
BALLN ULTRASCORE 014 2X200X150 (BALLOONS) ×3
BALLN ULTRSCOR 014 2.5X200X150 (BALLOONS) ×3
BALLOON LUTONIX 018 4X220X130 (BALLOONS) IMPLANT
BALLOON LUTONIX DCB 6X60X130 (BALLOONS) IMPLANT
BALLOON ULTRSC 014 2.5X200X150 (BALLOONS) IMPLANT
BALLOON ULTRSCRE 014 2X200X150 (BALLOONS) IMPLANT
CATH CROSSER 14S OTW 146CM (CATHETERS) ×2 IMPLANT
CATH PIG 70CM (CATHETERS) ×2 IMPLANT
CATH SIDEKICK XL ST 110CM (SHEATH) ×2 IMPLANT
CATH VERT 5FR 125CM (CATHETERS) ×2 IMPLANT
DEVICE PRESTO INFLATION (MISCELLANEOUS) ×2 IMPLANT
DEVICE STARCLOSE SE CLOSURE (Vascular Products) ×2 IMPLANT
DEVICE TORQUE .025-.038 (MISCELLANEOUS) ×2 IMPLANT
KIT FLOWMATE PROCEDURAL (KITS) ×2 IMPLANT
NDL ENTRY 21GA 7CM ECHOTIP (NEEDLE) IMPLANT
NEEDLE ENTRY 21GA 7CM ECHOTIP (NEEDLE) ×3 IMPLANT
PACK ANGIOGRAPHY (CUSTOM PROCEDURE TRAY) ×3 IMPLANT
SET INTRO CAPELLA COAXIAL (SET/KITS/TRAYS/PACK) ×2 IMPLANT
SHEATH BRITE TIP 5FRX11 (SHEATH) ×2 IMPLANT
SHEATH RAABE 6FR (SHEATH) ×2 IMPLANT
SYR MEDRAD MARK 7 150ML (SYRINGE) ×2 IMPLANT
TUBING CONTRAST HIGH PRESS 72 (TUBING) ×2 IMPLANT
VALVE HEMO TOUHY BORST Y (ADAPTER) ×2 IMPLANT
WIRE AQUATRACK .035X260CM (WIRE) ×2 IMPLANT
WIRE AQUATRAK .035X260 ANG (WIRE) IMPLANT
WIRE J 3MM .035X145CM (WIRE) ×2 IMPLANT
WIRE MAGIC TOR.035 180C (WIRE) ×2 IMPLANT
WIRE SPARTACORE .014X300CM (WIRE) ×2 IMPLANT

## 2018-12-10 NOTE — TOC Initial Note (Deleted)
Transition of Care [facility]) - Initial/Assessment Note    Patient Details  Name: [name] MRN: [id number] Date of Birth: June 30, 1928  [facility]) CM/SW Contact:    [name], LCSW Phone Number: 12/10/2018, 4:58 PM  Clinical Narrative:                  Patient is a long term care resident at [facility])  Patient plans to return back to SNF.  Patient did not have any complaints about SNF.  Patient has been there for several months.  Patient was explained role of CSW and process for coordinating discharge back to SNF.  Patient did not have any other questions or concerns.  Expected Discharge Plan: Skilled Nursing Facility Barriers to Discharge: Continued Medical Work up   Patient Goals and CMS Choice Patient states their goals for this hospitalization and ongoing recovery are:: To return back to [facility]) where she is a long term care resident.   Choice offered to / list presented to : Patient  Expected Discharge Plan and Services Expected Discharge Plan: [place] In-house Referral: Clinical Social Work   Post Acute Care Choice: [place] Living arrangements for the past 2 months: [place] Expected Discharge Date: 12/10/18                                    Prior Living Arrangements/Services Living arrangements for the past 2 months: [place] Lives with:: Facility Resident Patient language and need for interpreter reviewed:: No Do you feel safe going back to the place where you live?: Yes      Need for Family Participation in Patient Care: No (Comment) Care giver support system in place?: Yes (comment)   Criminal Activity/Legal Involvement Pertinent to Current Situation/Hospitalization: No - Comment as needed  Activities of Daily Living Home Assistive Devices/Equipment: Wheelchair ADL Screening (condition at time of admission) Patient's cognitive ability adequate  to safely complete daily activities?: Yes Is the patient deaf or have difficulty hearing?: No Does the patient have difficulty seeing, even when wearing glasses/contacts?: No Does the patient have difficulty concentrating, remembering, or making decisions?: No Patient able to express need for assistance with ADLs?: Yes Does the patient have difficulty dressing or bathing?: Yes Independently performs ADLs?: No Communication: Independent Dressing (OT): Needs assistance Is this a change from baseline?: Pre-admission baseline Grooming: Needs assistance Is this a change from baseline?: Pre-admission baseline Feeding: Independent Bathing: Needs assistance Is this a change from baseline?: Pre-admission baseline Toileting: Needs assistance Is this a change from baseline?: Pre-admission baseline In/Out Bed: Dependent Is this a change from baseline?: Pre-admission baseline Walks in Home: Dependent Is this a change from baseline?: Pre-admission baseline Does the patient have difficulty walking or climbing stairs?: Yes Weakness of Legs: Both Weakness of Arms/Hands: Both  Permission Sought/Granted Permission sought to share information with : Facility Sport and exercise psychologist, Family Supports Permission granted to share information with : Yes, Verbal Permission Granted  Share Information with NAME: [name] [phone]  [phone]   Permission granted to share info w AGENCY: SNF admissions        Emotional Assessment Appearance:: Appears stated age     Orientation: : Oriented to Situation, Oriented to  Time, Oriented to Place, Oriented to Self Alcohol / Substance Use: Not Applicable Psych Involvement: No (comment)  Admission diagnosis:  Acute respiratory failure with hypoxemia ([place]) [J96.01]  Patient Active Problem List   Diagnosis Date Noted  . Acute respiratory failure with hypoxemia ([place]) 12/07/2018  . Diabetes ([place]) 10/27/2018  . Pressure injury of skin 10/10/2018  .  CHF (congestive heart failure) ([place]) 10/09/2018  . Cancer of kidney ([place]) 02/10/2018  . Cataract cortical, senile 02/10/2018  . Emphysema lung ([place]) 02/10/2018  . Glaucoma (increased eye pressure) 02/10/2018  . Ulcer 02/10/2018  . GIB (gastrointestinal bleeding) 12/09/2017  . Chronic venous insufficiency 09/02/2017  . Lymphedema 09/02/2017  . Varicose veins of both lower extremities with pain 07/29/2017  . Hyperlipidemia 05/04/2017  . Hypothyroidism 05/04/2017  . Gout 10/27/2016  . CVA (cerebral vascular accident) ([place]) 07/20/2016  . Essential hypertension 07/14/2016  . Atherosclerosis of native arteries of the extremities with ulceration ([place]) 07/14/2016  . PVD (peripheral vascular disease) ([place]) 07/14/2016   PCP:  Rosaland Lao, NP Pharmacy:   [place], [place] - [address] [place] [address] Phone: [phone] Fax: [phone]     Social Determinants of Health (SDOH) Interventions    Readmission Risk Interventions No flowsheet data found.

## 2018-12-10 NOTE — NC FL2 (Signed)
[place] LEVEL OF CARE SCREENING TOOL     IDENTIFICATION  Patient Name: [name] Birthdate: [birth year] Sex: female Admission Date (Current Location): 12/07/2018  [place] and [name] Number:  Engineering geologist and Address:  [facility], [address] [place], [place] [address]      Provider Number: [id number]  Attending Physician Name and Address:  Dustin Flock, MD  Relative Name and Phone Number:  [phone]    Current Level of Care: Hospital Recommended Level of Care: [place] Prior Approval Number:    Date Approved/Denied:   PASRR Number: [id number]  Discharge Plan: SNF    Current Diagnoses: Patient Active Problem List   Diagnosis Date Noted  . Acute respiratory failure with hypoxemia ([place]) 12/07/2018  . Diabetes ([place]) 10/27/2018  . Pressure injury of skin 10/10/2018  . CHF (congestive heart failure) ([place]) 10/09/2018  . Cancer of kidney ([place]) 02/10/2018  . Cataract cortical, senile 02/10/2018  . Emphysema lung ([place]) 02/10/2018  . Glaucoma (increased eye pressure) 02/10/2018  . Ulcer 02/10/2018  . GIB (gastrointestinal bleeding) 12/09/2017  . Chronic venous insufficiency 09/02/2017  . Lymphedema 09/02/2017  . Varicose veins of both lower extremities with pain 07/29/2017  . Hyperlipidemia 05/04/2017  . Hypothyroidism 05/04/2017  . Gout 10/27/2016  . CVA (cerebral vascular accident) ([place]) 07/20/2016  . Essential hypertension 07/14/2016  . Atherosclerosis of native arteries of the extremities with ulceration ([place]) 07/14/2016  . PVD (peripheral vascular disease) ([place]) 07/14/2016    Orientation RESPIRATION BLADDER Height & Weight     Self, Time, Situation, Place  Normal Incontinent Weight: 173 lb 1 oz (78.5 kg) Height:  5\' 6"  (167.6 cm)  BEHAVIORAL SYMPTOMS/MOOD NEUROLOGICAL BOWEL NUTRITION STATUS      Continent Diet(Carb Modified)  AMBULATORY STATUS COMMUNICATION OF NEEDS Skin   Limited  Assist Verbally PU Stage and Appropriate Care, Surgical wounds, Skin abrasions   PU Stage 2 Dressing: (PRN)                   Personal Care Assistance Level of Assistance  Bathing, Feeding, Dressing Bathing Assistance: Limited assistance Feeding assistance: Independent Dressing Assistance: Limited assistance     Functional Limitations Info  Sight, Hearing, Speech Sight Info: Adequate Hearing Info: Adequate Speech Info: Adequate    SPECIAL CARE FACTORS FREQUENCY  PT (By licensed PT)     PT Frequency: Minimum 5x a week              Contractures      Additional Factors Info  Code Status Code Status Info: DNR             Current Medications (12/10/2018):  This is the current hospital active medication list Current Facility-Administered Medications  Medication Dose Route Frequency Provider Last Rate Last Dose  . acetaminophen (TYLENOL) tablet 650 mg  650 mg Oral Q6H PRN Schnier, Dolores Lory, MD       Or  . acetaminophen (TYLENOL) suppository 650 mg  650 mg Rectal Q6H PRN Schnier, Dolores Lory, MD      . allopurinol (ZYLOPRIM) tablet 100 mg  100 mg Oral Daily Schnier, Dolores Lory, MD   100 mg at 12/09/18 0837  . amitriptyline (ELAVIL) tablet 12.5 mg  12.5 mg Oral QHS Schnier, Dolores Lory, MD   12.5 mg at 12/09/18 2055  . aspirin EC tablet 81 mg  81 mg Oral Daily Schnier, Dolores Lory, MD   81 mg at 12/09/18 2641  . atorvastatin (LIPITOR) tablet  20 mg  20 mg Oral Daily Schnier, Dolores Lory, MD   20 mg at 12/09/18 0352  . bisacodyl (DULCOLAX) EC tablet 5 mg  5 mg Oral Daily PRN Schnier, Dolores Lory, MD      . clopidogrel (PLAVIX) tablet 75 mg  75 mg Oral Daily Schnier, Dolores Lory, MD   75 mg at 12/09/18 4818  . dextrose 50 % solution 50 mL  50 mL Intravenous Once Schnier, Dolores Lory, MD      . DULoxetine (CYMBALTA) DR capsule 30 mg  30 mg Oral Benna Dunks, Dolores Lory, MD   30 mg at 12/09/18 5909  . feeding supplement (ENSURE ENLIVE) (ENSURE ENLIVE) liquid 237 mL  237 mL Oral BID BM  Schnier, Dolores Lory, MD   237 mL at 12/09/18 1612  . feeding supplement (PRO-STAT SUGAR FREE 64) liquid 30 mL  30 mL Oral QHS Schnier, Dolores Lory, MD   30 mL at 12/09/18 2100  . fentaNYL (SUBLIMAZE) 100 MCG/2ML injection           . fentaNYL (SUBLIMAZE) 100 MCG/2ML injection           . furosemide (LASIX) injection 40 mg  40 mg Intravenous Q12H Schnier, Dolores Lory, MD   40 mg at 12/10/18 0530  . gabapentin (NEURONTIN) capsule 400 mg  400 mg Oral BID Delana Meyer Dolores Lory, MD   400 mg at 12/09/18 2055  . glucose chewable tablet 4 g  1 tablet Oral TID Delana Meyer Dolores Lory, MD   4 g at 12/09/18 2056  . Heparin (Porcine) in NaCl 1000-0.9 UT/500ML-% SOLN           . heparin 1000 UNIT/ML injection           . HYDROmorphone (DILAUDID) injection 1 mg  1 mg Intravenous Once PRN Schnier, Dolores Lory, MD      . HYDROmorphone (DILAUDID) injection 1 mg  1 mg Intravenous Once PRN Eulogio Ditch E, NP      . latanoprost (XALATAN) 0.005 % ophthalmic solution 1 drop  1 drop Both Eyes QHS Schnier, Dolores Lory, MD   1 drop at 12/09/18 2057  . levothyroxine (SYNTHROID) tablet 25 mcg  25 mcg Oral Q0600 Katha Cabal, MD   25 mcg at 12/09/18 2056  . lidocaine-EPINEPHrine (XYLOCAINE-EPINEPHrine) 1 %-1:200000 (PF) injection           . metoprolol tartrate (LOPRESSOR) tablet 200 mg  200 mg Oral BID Delana Meyer Dolores Lory, MD   200 mg at 12/09/18 2054  . midazolam (VERSED) 5 MG/5ML injection           . morphine 4 MG/ML injection 2 mg  2 mg Intravenous Q1H PRN Schnier, Dolores Lory, MD      . multivitamin with minerals tablet 1 tablet  1 tablet Oral Daily Schnier, Dolores Lory, MD   1 tablet at 12/09/18 [phone]  . ondansetron (ZOFRAN) tablet 4 mg  4 mg Oral Q6H PRN Schnier, Dolores Lory, MD       Or  . ondansetron [facility]) injection 4 mg  4 mg Intravenous Q6H PRN Schnier, Dolores Lory, MD      . ondansetron [facility]) injection 4 mg  4 mg Intravenous Q6H PRN Schnier, Dolores Lory, MD      . ondansetron [facility]) injection 4 mg  4 mg Intravenous Q6H PRN  Schnier, Dolores Lory, MD      . ondansetron [facility]) injection 4 mg  4 mg Intravenous Q6H PRN Kris Hartmann, NP      .  pantoprazole (PROTONIX) EC tablet 40 mg  40 mg Oral Daily Schnier, Dolores Lory, MD   40 mg at 12/09/18 0811  . sodium chloride flush (NS) 0.9 % injection 3 mL  3 mL Intravenous Q12H Schnier, Dolores Lory, MD   3 mL at 12/09/18 2101  . sodium chloride flush (NS) 0.9 % injection 3 mL  3 mL Intravenous Q12H Schnier, Dolores Lory, MD      . sodium chloride flush (NS) 0.9 % injection 3 mL  3 mL Intravenous PRN Schnier, Dolores Lory, MD      . topiramate (TOPAMAX) tablet 25 mg  25 mg Oral Q2000 Schnier, Dolores Lory, MD   25 mg at 12/09/18 2102     Discharge Medications: Please see discharge summary for a list of discharge medications.  Relevant Imaging Results:  Relevant Lab Results:   Additional Information SS# [id number]  [name], LCSW

## 2018-12-10 NOTE — Care Management Important Message (Signed)
Important Message  Patient Details  Name: [name] MRN: [id number] Date of Birth: 16-Mar-1928   Medicare Important Message Given:  Yes    Dannette Barbara 12/10/2018, 10:51 AM

## 2018-12-10 NOTE — H&P (Signed)
[facility] History & Physical Update  The patient was interviewed and re-examined.  The patient's previous History and Physical has been reviewed and is unchanged.  There is no change in the plan of care. We plan to proceed with the scheduled procedure.  Hortencia Pilar, MD  12/10/2018, 9:14 AM

## 2018-12-10 NOTE — Progress Notes (Signed)
Patient remains clinically stable post lower extremity angiogram with intervention per Dr Delana Meyer. Left groin without bleeding nor hematoma. Denies complaints at this time. Report called to care nurse with plan reviewed. Questions answered. Noted Dr Delana Meyer request for heparin gtt to be started, pharmacy consult pending. Vitals have remained stable.

## 2018-12-10 NOTE — Discharge Summary (Signed)
[name] at [facility], 85 y.o., DOB 1928-03-31, MRN [id number]. Admission date: 12/07/2018 Discharge Date 12/10/2018 Primary MD Rosaland Lao, NP Admitting Physician Epifanio Lesches, MD  Admission Diagnosis  Acute respiratory failure with hypoxemia [facility]) [J96.01]  Discharge Diagnosis   Active Problems:   Acute respiratory failure with hypoxemia ([place]) due to acute CHF Acute on chronic  diastolic CHF Hypoglycemia unclear etiology continue glucose tabs Previous history of CVA Hyperlipidemia Hypothyroidism Vascular dementia Peripheral vascular disease status post intervention during this hospitalization   Hospital Course 85-year-old female patient history of peripheral artery disease, neuropathy, GERD for leg angiogram by vascular development of hypoxia medicine is consulted for evaluation.  Patient was noted to have hypoxia chest x-ray suggested of CHF.  Patient was ruled out for COVID.  Patient's breathing is back to normal now.  She was also noticed to have hypoglycemia.  Etiology of this is unclear.  Possibly due to poor p.o. intake.  Sugars are now stabilized.  Patient underwent angiography with intervention per vascular.           Consults  vascular surgery  Significant Tests:  See full reports for all details     Dg Chest Port 1 View  Result Date: 12/07/2018 CLINICAL DATA:  Shortness of breath EXAM: PORTABLE CHEST 1 VIEW COMPARISON:  October 09, 2018 FINDINGS: There is no appreciable edema or consolidation. There is cardiomegaly with pulmonary vascularity within normal limits. No adenopathy. There is aortic atherosclerosis. No bone lesions. IMPRESSION: Stable cardiac enlargement. No edema or consolidation. Aortic Atherosclerosis (ICD10-I70.0). Electronically Signed   By: Lowella Grip III M.D.   On: 12/07/2018 14:08   Vas [place] Burnard Bunting With/wo Tbi  Result Date: 12/09/2018 LOWER EXTREMITY DOPPLER STUDY Indications:  Peripheral artery disease.  Vascular Interventions: 05/18/2018 PTA of the left peroneal artery. Comparison Study: 09/02/2018 Performing Technologist: Charlane Ferretti RT (R)(VS)  Examination Guidelines: A complete evaluation includes at minimum, Doppler waveform signals and systolic blood pressure reading at the level of bilateral brachial, anterior tibial, and posterior tibial arteries, when vessel segments are accessible. Bilateral testing is considered an integral part of a complete examination. Photoelectric Plethysmograph (PPG) waveforms and toe systolic pressure readings are included as required and additional duplex testing as needed. Limited examinations for reoccurring indications may be performed as noted.  ABI Findings: +---------+------------------+-----+----------+--------+ Right    Rt Pressure (mmHg)IndexWaveform  Comment  +---------+------------------+-----+----------+--------+ Brachial 121                                       +---------+------------------+-----+----------+--------+ ATA      66                0.48 monophasic         +---------+------------------+-----+----------+--------+ PTA      80                0.58 monophasic         +---------+------------------+-----+----------+--------+ Great Toe49                0.36 Abnormal           +---------+------------------+-----+----------+--------+ +---------+------------------+-----+----------+-------+ Left     Lt Pressure (mmHg)IndexWaveform  Comment +---------+------------------+-----+----------+-------+ Brachial 137                                      +---------+------------------+-----+----------+-------+  ATA      107               0.78 monophasic        +---------+------------------+-----+----------+-------+ PTA      100               0.73 monophasic        +---------+------------------+-----+----------+-------+ Great Toe94                0.69 Abnormal           +---------+------------------+-----+----------+-------+ +-------+-----------+-----------+------------+------------+ ABI/TBIToday's ABIToday's TBIPrevious ABIPrevious TBI +-------+-----------+-----------+------------+------------+ Right  .58        .36        .69         .45          +-------+-----------+-----------+------------+------------+ Left   .78        .69        .87         .75          +-------+-----------+-----------+------------+------------+ Bilateral ABIs appear decreased compared to prior study on 09/02/2018. Bilateral TBIs appear decreased compared to prior study on 09/02/2018.  Summary: Right: Resting right ankle-brachial index indicates moderate right lower extremity arterial disease. The right toe-brachial index is abnormal. Left: Resting left ankle-brachial index indicates moderate left lower extremity arterial disease. The left toe-brachial index is abnormal.  *See table(s) above for measurements and observations.  Electronically signed by Hortencia Pilar MD on 12/09/2018 at 4:32:31 PM.   Final        Today   Subjective:   [name] doing well denies any complaints Objective:   Blood pressure 102/60, pulse 75, temperature 97.9 F (36.6 C), temperature source Oral, resp. rate 19, height 5\' 6"  (1.676 m), weight 78.5 kg, SpO2 100 %.  .  Intake/Output Summary (Last 24 hours) at 12/10/2018 1234 Last data filed at 12/10/2018 0205 Gross per 24 hour  Intake 410 ml  Output 900 ml  Net -490 ml    Exam VITAL SIGNS: Blood pressure 102/60, pulse 75, temperature 97.9 F (36.6 C), temperature source Oral, resp. rate 19, height 5\' 6"  (1.676 m), weight 78.5 kg, SpO2 100 %.  GENERAL:  85 y.o.-year-old patient lying in the bed with no acute distress.  EYES: Pupils equal, round, reactive to light and accommodation. No scleral icterus. Extraocular muscles intact.  HEENT: Head atraumatic, normocephalic. Oropharynx and nasopharynx clear.  NECK:  Supple, no jugular venous  distention. No thyroid enlargement, no tenderness.  LUNGS: Normal breath sounds bilaterally, no wheezing, rales,rhonchi or crepitation. No use of accessory muscles of respiration.  CARDIOVASCULAR: S1, S2 normal. No murmurs, rubs, or gallops.  ABDOMEN: Soft, nontender, nondistended. Bowel sounds present. No organomegaly or mass.  EXTREMITIES: No pedal edema, cyanosis, or clubbing.  NEUROLOGIC: Cranial nerves II through XII are intact. Muscle strength 5/5 in all extremities. Sensation intact. Gait not checked.  PSYCHIATRIC: The patient is alert and oriented x 3.  SKIN: No obvious rash, lesion, or ulcer.   Data Review     CBC w Diff:  Lab Results  Component Value Date   WBC 7.9 12/09/2018   HGB 12.3 12/09/2018   HGB 12.0 09/18/2014   HCT 39.9 12/09/2018   HCT 37.2 09/18/2014   PLT 243 12/09/2018   PLT 254 09/18/2014   LYMPHOPCT 34 12/09/2018   LYMPHOPCT 26.9 09/18/2014   MONOPCT 5 12/09/2018   MONOPCT 7.6 09/18/2014   EOSPCT 3 12/09/2018   EOSPCT 4.5 09/18/2014   BASOPCT 0 12/09/2018  BASOPCT 0.4 09/18/2014   CMP:  Lab Results  Component Value Date   NA 139 12/10/2018   NA 139 09/22/2014   K 4.4 12/10/2018   K 4.4 09/22/2014   CL 103 12/10/2018   CL 109 (H) 09/22/2014   CO2 24 12/10/2018   CO2 24 09/22/2014   BUN 33 (H) 12/10/2018   BUN 9 09/22/2014   CREATININE 1.55 (H) 12/10/2018   CREATININE 0.97 09/22/2014   PROT 7.2 10/09/2018   PROT 8.1 09/14/2014   ALBUMIN 3.1 (L) 10/09/2018   ALBUMIN 2.9 (L) 09/14/2014   BILITOT 0.8 10/09/2018   BILITOT 0.5 09/14/2014   ALKPHOS 136 (H) 10/09/2018   ALKPHOS 145 (H) 09/14/2014   AST 25 10/09/2018   AST 23 09/14/2014   ALT 7 10/09/2018   ALT 9 (L) 09/14/2014  .  Micro Results Recent Results (from the past 240 hour(s))  Respiratory Panel by PCR     Status: None   Collection Time: 12/07/18  1:44 PM  Result Value Ref Range Status   Adenovirus NOT DETECTED NOT DETECTED Final   Coronavirus 229E NOT DETECTED NOT  DETECTED Final    Comment: (NOTE) The Coronavirus on the Respiratory Panel, DOES NOT test for the novel  Coronavirus (2019 nCoV)    Coronavirus HKU1 NOT DETECTED NOT DETECTED Final   Coronavirus NL63 NOT DETECTED NOT DETECTED Final   Coronavirus OC43 NOT DETECTED NOT DETECTED Final   Metapneumovirus NOT DETECTED NOT DETECTED Final   Rhinovirus / Enterovirus NOT DETECTED NOT DETECTED Final   Influenza A NOT DETECTED NOT DETECTED Final   Influenza B NOT DETECTED NOT DETECTED Final   Parainfluenza Virus 1 NOT DETECTED NOT DETECTED Final   Parainfluenza Virus 2 NOT DETECTED NOT DETECTED Final   Parainfluenza Virus 3 NOT DETECTED NOT DETECTED Final   Parainfluenza Virus 4 NOT DETECTED NOT DETECTED Final   Respiratory Syncytial Virus NOT DETECTED NOT DETECTED Final   Bordetella pertussis NOT DETECTED NOT DETECTED Final   Chlamydophila pneumoniae NOT DETECTED NOT DETECTED Final   Mycoplasma pneumoniae NOT DETECTED NOT DETECTED Final    Comment: Performed at [facility], [address], [place], [place] [address]  SARS Coronavirus 2 [facility] order, Performed in [facility] hospital lab)     Status: None   Collection Time: 12/07/18  3:26 PM  Result Value Ref Range Status   SARS Coronavirus 2 NEGATIVE NEGATIVE Final    Comment: (NOTE) If result is NEGATIVE SARS-CoV-2 target nucleic acids are NOT DETECTED. The SARS-CoV-2 RNA is generally detectable in upper and lower  respiratory specimens during the acute phase of infection. The lowest  concentration of SARS-CoV-2 viral copies this assay can detect is 250  copies / mL. A negative result does not preclude SARS-CoV-2 infection  and should not be used as the sole basis for treatment or other  patient management decisions.  A negative result may occur with  improper specimen collection / handling, submission of specimen other  than nasopharyngeal swab, presence of viral mutation(s) within the  areas targeted by this assay, and  inadequate number of viral copies  (<250 copies / mL). A negative result must be combined with clinical  observations, patient history, and epidemiological information. If result is POSITIVE SARS-CoV-2 target nucleic acids are DETECTED. The SARS-CoV-2 RNA is generally detectable in upper and lower  respiratory specimens dur ing the acute phase of infection.  Positive  results are indicative of active infection with SARS-CoV-2.  Clinical  correlation with  patient history and other diagnostic information is  necessary to determine patient infection status.  Positive results do  not rule out bacterial infection or co-infection with other viruses. If result is PRESUMPTIVE POSTIVE SARS-CoV-2 nucleic acids MAY BE PRESENT.   A presumptive positive result was obtained on the submitted specimen  and confirmed on repeat testing.  While 2019 novel coronavirus  (SARS-CoV-2) nucleic acids may be present in the submitted sample  additional confirmatory testing may be necessary for epidemiological  and / or clinical management purposes  to differentiate between  SARS-CoV-2 and other Sarbecovirus currently known to infect humans.  If clinically indicated additional testing with an alternate test  methodology [phone]) is advised. The SARS-CoV-2 RNA is generally  detectable in upper and lower respiratory sp ecimens during the acute  phase of infection. The expected result is Negative. Fact Sheet for Patients:  StrictlyIdeas.no Fact Sheet for Healthcare Providers: BankingDealers.co.za This test is not yet approved or cleared by the [place] FDA and has been authorized for detection and/or diagnosis of SARS-CoV-2 by FDA under an Emergency Use Authorization (EUA).  This EUA will remain in effect (meaning this test can be used) for the duration of the COVID-19 declaration under Section 564(b)(1) of the Act, 21 U.S.C. section 360bbb-3(b)(1), unless the  authorization is terminated or revoked sooner. Performed at [facility], [address] [place], [place] [address]   SARS Coronavirus 2 [facility] order, Performed in [place] hospital lab)     Status: None   Collection Time: 12/07/18  6:39 PM  Result Value Ref Range Status   SARS Coronavirus 2 NEGATIVE NEGATIVE Final    Comment: (NOTE) If result is NEGATIVE SARS-CoV-2 target nucleic acids are NOT DETECTED. The SARS-CoV-2 RNA is generally detectable in upper and lower  respiratory specimens during the acute phase of infection. The lowest  concentration of SARS-CoV-2 viral copies this assay can detect is 250  copies / mL. A negative result does not preclude SARS-CoV-2 infection  and should not be used as the sole basis for treatment or other  patient management decisions.  A negative result may occur with  improper specimen collection / handling, submission of specimen other  than nasopharyngeal swab, presence of viral mutation(s) within the  areas targeted by this assay, and inadequate number of viral copies  (<250 copies / mL). A negative result must be combined with clinical  observations, patient history, and epidemiological information. If result is POSITIVE SARS-CoV-2 target nucleic acids are DETECTED. The SARS-CoV-2 RNA is generally detectable in upper and lower  respiratory specimens dur ing the acute phase of infection.  Positive  results are indicative of active infection with SARS-CoV-2.  Clinical  correlation with patient history and other diagnostic information is  necessary to determine patient infection status.  Positive results do  not rule out bacterial infection or co-infection with other viruses. If result is PRESUMPTIVE POSTIVE SARS-CoV-2 nucleic acids MAY BE PRESENT.   A presumptive positive result was obtained on the submitted specimen  and confirmed on repeat testing.  While 2019 novel coronavirus  (SARS-CoV-2) nucleic acids may be present in  the submitted sample  additional confirmatory testing may be necessary for epidemiological  and / or clinical management purposes  to differentiate between  SARS-CoV-2 and other Sarbecovirus currently known to infect humans.  If clinically indicated additional testing with an alternate test  methodology [phone]) is advised. The SARS-CoV-2 RNA is generally  detectable in upper and lower respiratory sp ecimens during the  acute  phase of infection. The expected result is Negative. Fact Sheet for Patients:  StrictlyIdeas.no Fact Sheet for Healthcare Providers: BankingDealers.co.za This test is not yet approved or cleared by the [place] FDA and has been authorized for detection and/or diagnosis of SARS-CoV-2 by FDA under an Emergency Use Authorization (EUA).  This EUA will remain in effect (meaning this test can be used) for the duration of the COVID-19 declaration under Section 564(b)(1) of the Act, 21 U.S.C. section 360bbb-3(b)(1), unless the authorization is terminated or revoked sooner. Performed at [facility], [address] [place], [place] [address]   MRSA PCR Screening     Status: None   Collection Time: 12/07/18 10:40 PM  Result Value Ref Range Status   MRSA by PCR NEGATIVE NEGATIVE Final    Comment:        The GeneXpert MRSA Assay (FDA approved for NASAL specimens only), is one component of a comprehensive MRSA colonization surveillance program. It is not intended to diagnose MRSA infection nor to guide or monitor treatment for MRSA infections. Performed at [facility], [address] [place], [place] [address]         Code Status Orders  (From admission, onward)         Start     Ordered   12/07/18 1508  Do not attempt resuscitation (DNR)  Continuous    Question Answer Comment  In the event of cardiac or respiratory ARREST Do not call a "code blue"   In the event of cardiac or  respiratory ARREST Do not perform Intubation, CPR, defibrillation or ACLS   In the event of cardiac or respiratory ARREST Use medication by any route, position, wound care, and other measures to relive pain and suffering. May use oxygen, suction and manual treatment of airway obstruction as needed for comfort.   Comments NMP      12/07/18 1508        Code Status History    Date Active Date Inactive Code Status Order ID Comments User Context   12/07/2018 1508 12/07/2018 1508 Full Code [id number]  Epifanio Lesches, MD Inpatient   10/09/2018 1701 10/13/2018 1736 DNR [id number]  Henreitta Leber, MD Inpatient   05/18/2018 1259 05/18/2018 1739 Full Code [id number]  Katha Cabal, MD Inpatient   02/23/2018 1201 02/23/2018 1644 Full Code [id number]  Katha Cabal, MD Inpatient   12/09/2017 1646 12/11/2017 1850 DNR [id number]  Demetrios Loll, MD Inpatient   05/03/2017 0349 05/04/2017 2209 Full Code [id number]  Saundra Shelling, MD Inpatient   10/07/2016 1203 10/07/2016 1738 Full Code [id number]  Delana Meyer, Dolores Lory, MD Inpatient   09/16/2016 1027 09/16/2016 1711 Full Code [id number]  Katha Cabal, MD Inpatient   09/02/2016 1051 09/02/2016 1601 Full Code [id number]  Katha Cabal, MD Inpatient   07/22/2016 1002 07/22/2016 2318 DNR [id number]  Fritzi Mandes, MD Inpatient   07/20/2016 1455 07/22/2016 1002 Full Code [id number]  Hillary Bow, MD ED    Advance Directive Documentation     Most Recent Value  Type of Advance Directive  Healthcare Power of Attorney  Pre-existing out of facility DNR order (yellow form or pink MOST form)  -  "MOST" Form in Place?  -          Follow-up Information    Rosaland Lao, NP .   Specialty:  Nurse Practitioner Contact information: [address] [place] [phone]        Schnier, Dolores Lory, MD Follow up in  2 week(s).   Specialties:  Vascular Surgery, Cardiology, Radiology, Vascular Surgery Contact information: [place]   [address] [phone]           Discharge Medications   Allergies as of 12/10/2018   No Known Allergies     Medication List    TAKE these medications   acetaminophen 325 MG tablet Commonly known as:  TYLENOL Take 650 mg by mouth every 4 (four) hours as needed for mild pain.   allopurinol 100 MG tablet Commonly known as:  ZYLOPRIM Take 100 mg by mouth daily.   amitriptyline 25 MG tablet Commonly known as:  ELAVIL Take 12.5 mg by mouth at bedtime.   aspirin 81 MG EC tablet Take 1 tablet (81 mg total) by mouth daily.   atorvastatin 20 MG tablet Commonly known as:  LIPITOR Take 20 mg by mouth daily.   clopidogrel 75 MG tablet Commonly known as:  Plavix Take 1 tablet (75 mg total) by mouth daily.   DULoxetine 30 MG capsule Commonly known as:  CYMBALTA Take 30 mg by mouth every morning.   feeding supplement (PRO-STAT SUGAR FREE 64) Liqd Take 30 mLs by mouth at bedtime.   furosemide 40 MG tablet Commonly known as:  LASIX Take 40 mg by mouth daily.   gabapentin 400 MG capsule Commonly known as:  NEURONTIN Take 400 mg by mouth 2 (two) times daily.   glucose 4 GM chewable tablet Chew 1 tablet (4 g total) by mouth 3 (three) times daily.   latanoprost 0.005 % ophthalmic solution Commonly known as:  XALATAN Place 1 drop into both eyes at bedtime.   levothyroxine 25 MCG tablet Commonly known as:  SYNTHROID Take 25 mcg by mouth daily before breakfast.   metoprolol tartrate 100 MG tablet Commonly known as:  LOPRESSOR Take 200 mg by mouth 2 (two) times daily.   metoprolol tartrate 25 MG tablet Commonly known as:  LOPRESSOR Take 25 mg by mouth 2 (two) times daily. Pt takes 225 mg daily   pantoprazole 40 MG tablet Commonly known as:  PROTONIX Take 40 mg by mouth daily.   potassium chloride 10 MEQ tablet Commonly known as:  K-DUR Take 10 mEq by mouth daily.   senna 8.6 MG tablet Commonly known as:  SENOKOT Take 2 tablets by mouth 2 (two) times daily.    topiramate 25 MG tablet Commonly known as:  TOPAMAX Take 25 mg by mouth at bedtime.   traMADol 50 MG tablet Commonly known as:  ULTRAM Take 1 tablet (50 mg total) by mouth every 8 (eight) hours as needed (for pain control). What changed:  when to take this   Vitamin D-3 25 MCG (1000 UT) Caps Take 2,000 Units by mouth daily.          Total Time in preparing paper work, data evaluation and todays exam - 19 minutes  Dustin Flock M.D on 12/10/2018 at 12:34 PM [place]  [phone]

## 2018-12-10 NOTE — Op Note (Addendum)
[facility] Percutaneous Study/Intervention Procedural Note   Date of Surgery: 12/10/2018  Surgeon:  Katha Cabal, MD.  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 Introduction catheter into right lower extremity 3rd order catheter placement  2. Contrast injection right lower extremity for distal runoff   3. Crosser atherectomy of the right tibioperoneal trunk and peroneal artery 4. Percutaneous transluminal angioplasty right tibioperoneal trunk and peroneal artery with a 2.5 mm ultra score balloon             5.  Percutaneous transluminal angioplasty of the popliteal artery with a 4 mm Lutonix drug-eluting balloon                      6.   Percutaneous transluminal angioplasty left external iliac artery to 6 mm with a Lutonix drug-eluting balloon.             7.   Star close closure left common femoral arteriotomy                Anesthesia: Conscious sedation was administered under my direct supervision by the interventional radiology RN. IV Versed plus fentanyl were utilized. Continuous ECG, pulse oximetry and blood pressure was monitored throughout the entire procedure. Conscious sedation was for a total of 130 minutes.  Sheath: 6 French Raby left common femoral retrograde  Contrast: 135 cc  Fluoroscopy Time: 17.7 minutes  Indications: [name] presents with atherosclerotic occlusive disease bilateral lower extremities. The patient has developed ulceration and gangrene of the soft tissues of the right lower extremity. This places the patient at high risk for limb loss and amputation. The risks and benefits are reviewed all questions answered patient agrees to proceed.  Procedure: [name] is a 85 y.o. y.o. female who was  identified and appropriate procedural time out was performed. The patient was then placed supine on the table and prepped and draped in the usual sterile fashion.   Ultrasound was placed in the sterile sleeve and the left groin was evaluated the left common femoral artery was echolucent and pulsatile indicating patency.  Image was recorded for the permanent record and under real-time visualization a microneedle was inserted into the common femoral artery microwire followed by a micro-sheath.  A J-wire was then advanced through the micro-sheath and a  5 [place] sheath was then inserted over a J-wire. J-wire was then advanced and a 5 French pigtail catheter was positioned at the level of T12. AP projection of the aorta was then obtained. Pigtail catheter was repositioned to above the bifurcation and a LAO view of the pelvis was obtained.  Subsequently a pigtail catheter with the stiff angle Glidewire was used to cross the aortic bifurcation the catheter wire were advanced down into the right distal external iliac artery. Oblique view of the femoral bifurcation was then obtained and subsequently the wire was reintroduced and the pigtail catheter negotiated into the SFA representing third order catheter placement. Distal runoff was then performed.  Diagnostic interpretation: The abdominal aorta is opacified with a bolus injection contrast.  It is free of hemodynamically significant stenoses.  Bilateral single renal arteries are noted no evidence of hemodynamically significant radial artery stenosis is noted.  The common iliac arteries are noted be widely patent patent bilaterally as are the bilateral internal iliac arteries.  On the right the external iliac artery  is widely patent.  On the left there is a 65 to 70% stenosis at the origin of the left external iliac artery.  The mid and distal portions of the left external iliac artery are widely patent.  The right common femoral and profunda femoris are widely  patent although diffuse disease is noted.  The SFA is demonstrates increasing disease but is patent.  The popliteal artery demonstrates diffuse increasing disease with multiple greater than 70% stenoses the most severe of which is in the midportion at the level of the femoral condyles.  The trifurcation is extensively diseased with occlusion of the anterior tibial and posterior tibial.  There is also occlusion of the tibioperoneal trunk and peroneal.  Collaterals are noted reconstituting the mid peroneal which is then patent down to the ankle where a collateral extends across filling the dorsalis pedis.  5000 units of heparin was then given and allowed to circulate and a 6 [place] Raby sheath was advanced up and over the bifurcation and positioned in the common femoral artery.  Wire and catheter were then used to negotiate the SFA stenosis and the catheter was then positioned in the distal popliteal where magnified imaging of the trifurcation was performed.  The 89 S Crosser catheter was then prepped on the field and a straight micro catheter was advanced into the cul-de-sac of the distal popliteal under magnified imaging. Using the Crosser catheter the occlusion of the tibioperoneal trunk and peroneal was negotiated.  Injection of contrast confirmed intraluminal positioning in the mid to distal peroneal.  A [place] core wire was then advanced through the lumen in the Crosser catheter.  A 2 mm x 20 mm ultra score balloon was then used to angioplasty the peroneal artery.  The second inflation was with a 2.5 mm x 20 mm ultra score balloon inflations were to 10-12 atm for 1 full minute. Follow-up imaging demonstrated patency of the tibial artery.    A 4 mm x 22 cm Lutonix balloon was used to angioplasty the popliteal artery. Inflation were to 12 atmospheres for 2 minutes. Follow-up imaging demonstrated patency with adequate preparation of the vessel for a drug-coated balloon.   Distal runoff was then  reassessed.  After review of these images the sheath is pulled into the left external external iliac artery and magnified imaging of the iliac bifurcations obtained.  Wire is then advanced across the lesion into the abdominal aorta and a 6 mm x 60 mm Lutonix drug-eluting balloon is advanced across the external iliac lesion inflation is to 12 atm for 2 minutes.  Follow-up imaging demonstrates less than 10% residual stenosis.  The sheath was then repositioned and oblique of the left common femoral is obtained and a Star close device deployed. There no immediate Complications.  Findings:  The abdominal aorta is opacified with a bolus injection contrast.  It is free of hemodynamically significant stenoses.  Bilateral single renal arteries are noted no evidence of hemodynamically significant radial artery stenosis is noted.  The common iliac arteries are noted be widely patent patent bilaterally as are the bilateral internal iliac arteries.  On the right the external iliac artery is widely patent.  On the left there is a 65 to 70% stenosis at the origin of the left external iliac artery.  The mid and distal portions of the left external iliac artery are widely patent.  The right common femoral and profunda femoris are widely patent although diffuse disease is noted.  The SFA is demonstrates increasing disease but  is patent.  The popliteal artery demonstrates diffuse increasing disease with multiple greater than 70% stenoses the most severe of which is in the midportion at the level of the femoral condyles.  The trifurcation is extensively diseased with occlusion of the anterior tibial and posterior tibial.  There is also occlusion of the tibioperoneal trunk and peroneal.  Collaterals are noted reconstituting the mid peroneal which is then patent down to the ankle where a collateral extends across filling the dorsalis pedis.  Following angioplasty the peroneal tibial now is widely patent in-line flow and looks  quite nice with less than 5% residual stenosis. Angioplasty of the SFA and popliteal with a Lutonix balloon shows an excellent result with less than 10% residual stenosis.  Following angioplasty to 6 mm with a Lutonix drug-eluting balloon of the external iliac artery on the left there is less than 10% residual stenosis.   Disposition: Patient was taken to the recovery room in stable condition having tolerated the procedure well.  [name] Darcell Sabino 12/10/2018,3:28 PM

## 2018-12-10 NOTE — TOC Initial Note (Signed)
Transition of Care [facility]) - Initial/Assessment Note    Patient Details  Name: [name] MRN: [id number] Date of Birth: Oct 19, 1927  [facility]) CM/SW Contact:    [name], LCSW Phone Number: 12/10/2018, 5:02 PM  Clinical Narrative:    Patient is a long term care resident at [facility])  Patient plans to return back to SNF.  Patient did not have any complaints about SNF.  Patient has been there for several months.  Patient was explained role of CSW and process for coordinating discharge back to SNF.  Patient did not have any other questions or concerns.                 Expected Discharge Plan: Skilled Nursing Facility Barriers to Discharge: Continued Medical Work up   Patient Goals and CMS Choice Patient states their goals for this hospitalization and ongoing recovery are:: To return back to [facility]) where she is a long term care resident.   Choice offered to / list presented to : Patient  Expected Discharge Plan and Services Expected Discharge Plan: [place] In-house Referral: Clinical Social Work   Post Acute Care Choice: [place] Living arrangements for the past 2 months: [place] Expected Discharge Date: 12/10/18                                    Prior Living Arrangements/Services Living arrangements for the past 2 months: [place] Lives with:: Facility Resident Patient language and need for interpreter reviewed:: No Do you feel safe going back to the place where you live?: Yes      Need for Family Participation in Patient Care: No (Comment) Care giver support system in place?: Yes (comment)   Criminal Activity/Legal Involvement Pertinent to Current Situation/Hospitalization: No - Comment as needed  Activities of Daily Living Home Assistive Devices/Equipment: Wheelchair ADL Screening (condition at time of admission) Patient's cognitive ability  adequate to safely complete daily activities?: Yes Is the patient deaf or have difficulty hearing?: No Does the patient have difficulty seeing, even when wearing glasses/contacts?: No Does the patient have difficulty concentrating, remembering, or making decisions?: No Patient able to express need for assistance with ADLs?: Yes Does the patient have difficulty dressing or bathing?: Yes Independently performs ADLs?: No Communication: Independent Dressing (OT): Needs assistance Is this a change from baseline?: Pre-admission baseline Grooming: Needs assistance Is this a change from baseline?: Pre-admission baseline Feeding: Independent Bathing: Needs assistance Is this a change from baseline?: Pre-admission baseline Toileting: Needs assistance Is this a change from baseline?: Pre-admission baseline In/Out Bed: Dependent Is this a change from baseline?: Pre-admission baseline Walks in Home: Dependent Is this a change from baseline?: Pre-admission baseline Does the patient have difficulty walking or climbing stairs?: Yes Weakness of Legs: Both Weakness of Arms/Hands: Both  Permission Sought/Granted Permission sought to share information with : Facility Sport and exercise psychologist, Family Supports Permission granted to share information with : Yes, Verbal Permission Granted  Share Information with NAME: [name] [phone]  [phone]   Permission granted to share info w AGENCY: SNF admissions        Emotional Assessment Appearance:: Appears stated age     Orientation: : Oriented to Situation, Oriented to  Time, Oriented to Place, Oriented to Self Alcohol / Substance Use: Not Applicable Psych Involvement: No (comment)  Admission diagnosis:  Acute respiratory failure with hypoxemia ([place]) [  J96.01] Patient Active Problem List   Diagnosis Date Noted  . Acute respiratory failure with hypoxemia ([place]) 12/07/2018  . Diabetes ([place]) 10/27/2018  . Pressure injury of skin  10/10/2018  . CHF (congestive heart failure) ([place]) 10/09/2018  . Cancer of kidney ([place]) 02/10/2018  . Cataract cortical, senile 02/10/2018  . Emphysema lung ([place]) 02/10/2018  . Glaucoma (increased eye pressure) 02/10/2018  . Ulcer 02/10/2018  . GIB (gastrointestinal bleeding) 12/09/2017  . Chronic venous insufficiency 09/02/2017  . Lymphedema 09/02/2017  . Varicose veins of both lower extremities with pain 07/29/2017  . Hyperlipidemia 05/04/2017  . Hypothyroidism 05/04/2017  . Gout 10/27/2016  . CVA (cerebral vascular accident) ([place]) 07/20/2016  . Essential hypertension 07/14/2016  . Atherosclerosis of native arteries of the extremities with ulceration ([place]) 07/14/2016  . PVD (peripheral vascular disease) ([place]) 07/14/2016   PCP:  Rosaland Lao, NP Pharmacy:   [place], [place] - [address] [place] [address] Phone: [phone] Fax: [phone]     Social Determinants of Health (SDOH) Interventions    Readmission Risk Interventions Readmission Risk Prevention Plan 12/10/2018  Transportation Screening Complete  PCP or Specialist Appt within 3-5 Days Complete  Social Work Consult for [place] Planning/Counseling Complete  Palliative Care Screening Not Applicable  Medication Review Press photographer) Complete  Some recent data might be hidden

## 2018-12-10 NOTE — Progress Notes (Addendum)
Pharmacy consult for heparin was placed 4/24 @1153 . Heparin was ordered by pharmacy at 4/24 @1209  to start @1230  per Dr. Delana Meyer. Heparin still has not started. Messaged RN to notify her the heparin orders were already placed.

## 2018-12-10 NOTE — Progress Notes (Signed)
Lab here to do venous stick which shows 72, noting fingerstick less conclusive. Notified Dr Posey Pronto who gave permission to use earlobe for blood sugar check.

## 2018-12-10 NOTE — Consult Note (Addendum)
[facility] for Heparin Indication: complex tibial intervention  No Known Allergies  Patient Measurements: Height: 5\' 6"  (167.6 cm) Weight: 173 lb 1 oz (78.5 kg) IBW/kg (Calculated) : 59.3 Heparin Dosing Weight: 78.9 kg  Vital Signs: Temp: 97.9 F (36.6 C) (04/24 0721) Temp Source: Oral (04/24 0721) BP: 113/66 (04/24 1150) Pulse Rate: 71 (04/24 1150)  Labs: Recent Labs    12/07/18 2218 12/08/18 0320 12/09/18 0358 12/10/18 0246  HGB 12.2 12.3 12.3  --   HCT 40.5 39.4 39.9  --   PLT 231 223 243  --   CREATININE  --  1.54* 1.37* 1.55*    Estimated Creatinine Clearance: 26 mL/min (A) (by C-G formula based on SCr of 1.55 mg/dL (H)).   Medical History: Past Medical History:  Diagnosis Date  . Abnormal glucose   . Anxiety   . Arthritis   . Asthma   . Calculus of gallbladder without mention of cholecystitis, with obstruction   . Cancer ([place])    kidney  . Candidiasis of the esophagus   . Cataract cortical, senile   . CHF (congestive heart failure) ([place])   . Chronic kidney disease, stage III (moderate) (HCC)   . Chronic pain   . Chronic pain syndrome   . Corns and callosities   . Debility   . Depression   . Dermatophytosis of nail   . Diabetes mellitus without complication ([place])    unspecified, without mention of complication  . Difficult intubation   . Dysphagia   . Dyspnea    with exertion  . Edema, unspecified   . Emphysema lung ([place])   . Emphysema of lung ([place])   . Esophageal reflux   . Essential hypertension, benign   . Generalized atherosclerosis   . Glaucoma (increased eye pressure)   . Gout, unspecified   . Hemiplegia ([place])   . Hereditary and idiopathic peripheral neuropathy   . Hyperglycemia   . Hyperlipidemia   . Hypertension   . Hypoglycemia, unspecified   . Hypopotassemia   . Hypothyroidism   . Migraine, unspecified, without mention of intractable migraine without mention of status migrainosus   .  Other vitamin B12 deficiency anemia   . Pain in limb   . Peripheral vascular disease ([place])   . Personal history of fall   . Stroke ([place])   . Thyroid disease   . Ulcer of foot ([place])   . Unspecified asthma(493.90)   . Unspecified constipation   . Unspecified hypertensive heart disease with heart failure(402.91)   . Unspecified urinary incontinence   . Unspecified vitamin D deficiency     Medications:  Medications Prior to Admission  Medication Sig Dispense Refill Last Dose  . acetaminophen (TYLENOL) 325 MG tablet Take 650 mg by mouth every 4 (four) hours as needed for mild pain.    Past Week at Unknown time  . allopurinol (ZYLOPRIM) 100 MG tablet Take 100 mg by mouth daily.   12/07/2018 at Unknown time  . Amino Acids-Protein Hydrolys (FEEDING SUPPLEMENT, PRO-STAT SUGAR FREE 64,) LIQD Take 30 mLs by mouth at bedtime.   12/06/2018 at Unknown time  . amitriptyline (ELAVIL) 25 MG tablet Take 12.5 mg by mouth at bedtime.    12/06/2018 at Unknown time  . aspirin EC 81 MG EC tablet Take 1 tablet (81 mg total) by mouth daily. 30 tablet 0 12/07/2018 at Unknown time  . atorvastatin (LIPITOR) 20 MG tablet Take 20 mg by mouth daily.   12/06/2018  at Unknown time  . Cholecalciferol (VITAMIN D-3) 1000 UNITS CAPS Take 2,000 Units by mouth daily.    12/07/2018 at Unknown time  . clopidogrel (PLAVIX) 75 MG tablet Take 1 tablet (75 mg total) by mouth daily. 30 tablet 4 12/07/2018 at Unknown time  . DULoxetine (CYMBALTA) 30 MG capsule Take 30 mg by mouth every morning.   12/06/2018 at Unknown time  . furosemide (LASIX) 40 MG tablet Take 40 mg by mouth daily.   12/07/2018 at Unknown time  . gabapentin (NEURONTIN) 400 MG capsule Take 400 mg by mouth 2 (two) times daily.    12/07/2018 at Unknown time  . latanoprost (XALATAN) 0.005 % ophthalmic solution Place 1 drop into both eyes at bedtime.   12/07/2018 at Unknown time  . levothyroxine (SYNTHROID, LEVOTHROID) 25 MCG tablet Take 25 mcg by mouth daily before breakfast.    12/06/2018 at Unknown time  . metoprolol tartrate (LOPRESSOR) 100 MG tablet Take 200 mg by mouth 2 (two) times daily.    12/07/2018 at Unknown time  . pantoprazole (PROTONIX) 40 MG tablet Take 40 mg by mouth daily.    12/07/2018 at Unknown time  . potassium chloride (K-DUR,KLOR-CON) 10 MEQ tablet Take 10 mEq by mouth daily.   12/07/2018 at Unknown time  . senna (SENOKOT) 8.6 MG tablet Take 2 tablets by mouth 2 (two) times daily.    Past Week at Unknown time  . topiramate (TOPAMAX) 25 MG tablet Take 25 mg by mouth at bedtime.   12/06/2018 at Unknown time  . traMADol (ULTRAM) 50 MG tablet Take 1 tablet (50 mg total) by mouth every 8 (eight) hours as needed (for pain control). (Patient taking differently: Take 50 mg by mouth every 6 (six) hours as needed (for pain control). ) 20 tablet 0 12/07/2018 at Unknown time  . metoprolol tartrate (LOPRESSOR) 25 MG tablet Take 25 mg by mouth 2 (two) times daily. Pt takes 225 mg daily      Scheduled:  . [MAR Hold] allopurinol  100 mg Oral Daily  . [MAR Hold] amitriptyline  12.5 mg Oral QHS  . [MAR Hold] aspirin EC  81 mg Oral Daily  . [MAR Hold] atorvastatin  20 mg Oral Daily  . [MAR Hold] clopidogrel  75 mg Oral Daily  . [MAR Hold] dextrose  50 mL Intravenous Once  . [MAR Hold] DULoxetine  30 mg Oral BH-q7a  . [MAR Hold] feeding supplement (ENSURE ENLIVE)  237 mL Oral BID BM  . [MAR Hold] feeding supplement (PRO-STAT SUGAR FREE 64)  30 mL Oral QHS  . fentaNYL      . fentaNYL      . [MAR Hold] furosemide  40 mg Intravenous Q12H  . [MAR Hold] gabapentin  400 mg Oral BID  . [MAR Hold] glucose  1 tablet Oral TID  . Heparin (Porcine) in NaCl      . heparin      . [MAR Hold] latanoprost  1 drop Both Eyes QHS  . [MAR Hold] levothyroxine  25 mcg Oral Q0600  . lidocaine-EPINEPHrine      . [MAR Hold] metoprolol tartrate  200 mg Oral BID  . midazolam      . [MAR Hold] multivitamin with minerals  1 tablet Oral Daily  . [MAR Hold] pantoprazole  40 mg Oral Daily  .  [MAR Hold] sodium chloride flush  3 mL Intravenous Q12H  . [MAR Hold] topiramate  25 mg Oral Q2000   Infusions:  . sodium chloride 75 mL/hr  at 12/10/18 0100   PRN: [MAR Hold] acetaminophen **OR** [MAR Hold] acetaminophen, [MAR Hold] bisacodyl, [MAR Hold]  HYDROmorphone (DILAUDID) injection, [MAR Hold] ondansetron **OR** [MAR Hold] ondansetron (ZOFRAN) IV, [MAR Hold] ondansetron (ZOFRAN) IV Anti-infectives (From admission, onward)   Start     Dose/Rate Route Frequency Ordered Stop   12/10/18 0700  [MAR Hold]  ceFAZolin (ANCEF) IVPB 1 g/50 mL premix     (MAR Hold since Fri 12/10/2018 at 1021. Reason: Transfer to a Procedural area.)  Note to Pharmacy:  Please send with patient to specials   1 g 100 mL/hr over 30 Minutes Intravenous On call 12/09/18 1020 12/10/18 1045   12/07/18 1046  ceFAZolin (ANCEF) 2-4 GM/100ML-% IVPB    Note to Pharmacy:  Despina Arias  : cabinet override      12/07/18 1046 12/07/18 2259   12/06/18 2145  ceFAZolin (ANCEF) IVPB 2g/100 mL premix  Status:  Discontinued     2 g 200 mL/hr over 30 Minutes Intravenous  Once 12/06/18 2141 12/07/18 1514      Assessment: Pharmacy consulted to start heparin s/p lower extremity angiography (complex tibial intervention). Pt was on not a DOAC PTA. Heparin will begin at 4/24/@1230 . Enoxaparin DVT ppx was d/c'ed   Goal of Therapy:  Heparin level 0.3-0.7 units/ml Monitor platelets by anticoagulation protocol: Yes   Plan:  Start heparin infusion at 900 units/hr Check anti-Xa level in 6 hours and daily while on heparin.  Continue to monitor H&H and platelets  Oswald Hillock, PharmD, BCPS 12/10/2018,12:04 PM

## 2018-12-10 NOTE — Progress Notes (Signed)
Report received from Specials RN at 1:45 pm regarding balloon procedure to Posterior tibeal, Left Iliac, perineal artery, popliteal procedure. Patient then returned to room accompanied by RN, in NAD around 2:15 pm. Left groin site with dressing, with no bleeding or hematoma noted. IV with [place] was infusing at 75 cc/hour; Fluids stopped as ordered. Left inguinal area noted to have small skin tear 1.5 inches long, with small amount blood.  Small transparent dressing applied. Lab tech in to draw labs; unable to obtain all labs, but BG was obtained. Meds given and patient assisted with lunch. Purewick applied. Pt denies pain, with no complaints.

## 2018-12-10 NOTE — Progress Notes (Signed)
[facility]                                                                                                                                                                                  Patient Demographics   [name], is a 85 y.o. female, DOB - 09/19/1927, NOI:[id number]  Admit date - 12/07/2018   Admitting Physician Epifanio Lesches, MD  Outpatient Primary MD for the patient is Philis Fendt, Morene Antu, NP   LOS - 3  Subjective: Patient doing better under went vascular procedure earlier  Review of Systems:   CONSTITUTIONAL: No documented fever. No fatigue, weakness. No weight gain, no weight loss.  EYES: No blurry or double vision.  ENT: No tinnitus. No postnasal drip. No redness of the oropharynx.  RESPIRATORY: No cough, no wheeze, no hemoptysis. No dyspnea.  CARDIOVASCULAR: No chest pain. No orthopnea. No palpitations. No syncope.  GASTROINTESTINAL: No nausea, no vomiting or diarrhea. No abdominal pain. No melena or hematochezia.  GENITOURINARY: No dysuria or hematuria.  ENDOCRINE: No polyuria or nocturia. No heat or cold intolerance.  HEMATOLOGY: No anemia. No bruising. No bleeding.  INTEGUMENTARY: No rashes. No lesions.  MUSCULOSKELETAL: No arthritis. No swelling. No gout.  NEUROLOGIC: No numbness, tingling, or ataxia. No seizure-type activity.  PSYCHIATRIC: No anxiety. No insomnia. No ADD.    Vitals:   Vitals:   12/10/18 1235 12/10/18 1245 12/10/18 1250 12/10/18 1300  BP:  96/71    Pulse:      Resp: 16 14 18 10   Temp:      TempSrc:      SpO2:   97% 97%  Weight:      Height:        Wt Readings from Last 3 Encounters:  12/10/18 78.5 kg  10/13/18 100.1 kg  09/02/18 95.3 kg     Intake/Output Summary (Last 24 hours) at 12/10/2018 1505 Last data filed at 12/10/2018 1503 Gross per 24 hour  Intake 410 ml  Output 700 ml  Net -290 ml    Physical Exam:   GENERAL: Pleasant-appearing in no apparent distress.  HEAD,  EYES, EARS, NOSE AND THROAT: Atraumatic, normocephalic. Extraocular muscles are intact. Pupils equal and reactive to light. Sclerae anicteric. No conjunctival injection. No oro-pharyngeal erythema.  NECK: Supple. There is no jugular venous distention. No bruits, no lymphadenopathy, no thyromegaly.  HEART: Regular rate and rhythm,. No murmurs, no rubs, no clicks.  LUNGS: Clear to auscultation bilaterally. No rales or rhonchi. No wheezes.  ABDOMEN: Soft, flat, nontender, nondistended. Has good bowel sounds. No hepatosplenomegaly appreciated.  EXTREMITIES: No evidence of any cyanosis, clubbing, or peripheral edema.  +2 pedal and radial pulses  bilaterally.  NEUROLOGIC: The patient is alert, awake, and oriented x3 with no focal motor or sensory deficits appreciated bilaterally.  SKIN: Moist and warm with no rashes appreciated.  Psych: Not anxious, depressed LN: No inguinal LN enlargement    Antibiotics   Anti-infectives (From admission, onward)   Start     Dose/Rate Route Frequency Ordered Stop   12/10/18 1300  ceFAZolin (ANCEF) IVPB 2g/100 mL premix  Status:  Discontinued     2 g 200 mL/hr over 30 Minutes Intravenous  Once 12/10/18 1246 12/10/18 1406   12/10/18 0700  ceFAZolin (ANCEF) IVPB 1 g/50 mL premix    Note to Pharmacy:  Please send with patient to specials   1 g 100 mL/hr over 30 Minutes Intravenous On call 12/09/18 1020 12/10/18 1045   12/07/18 1046  ceFAZolin (ANCEF) 2-4 GM/100ML-% IVPB    Note to Pharmacy:  Despina Arias  : cabinet override      12/07/18 1046 12/07/18 2259   12/06/18 2145  ceFAZolin (ANCEF) IVPB 2g/100 mL premix  Status:  Discontinued     2 g 200 mL/hr over 30 Minutes Intravenous  Once 12/06/18 2141 12/07/18 1514      Medications   Scheduled Meds: . allopurinol  100 mg Oral Daily  . amitriptyline  12.5 mg Oral QHS  . aspirin EC  81 mg Oral Daily  . atorvastatin  20 mg Oral Daily  . clopidogrel  75 mg Oral Daily  . dextrose  50 mL Intravenous Once   . DULoxetine  30 mg Oral BH-q7a  . feeding supplement (ENSURE ENLIVE)  237 mL Oral BID BM  . feeding supplement (PRO-STAT SUGAR FREE 64)  30 mL Oral QHS  . fentaNYL      . fentaNYL      . furosemide  40 mg Intravenous Q12H  . gabapentin  400 mg Oral BID  . glucose  1 tablet Oral TID  . Heparin (Porcine) in NaCl      . heparin      . latanoprost  1 drop Both Eyes QHS  . levothyroxine  25 mcg Oral Q0600  . lidocaine-EPINEPHrine      . metoprolol tartrate  200 mg Oral BID  . midazolam      . multivitamin with minerals  1 tablet Oral Daily  . pantoprazole  40 mg Oral Daily  . sodium chloride flush  3 mL Intravenous Q12H  . sodium chloride flush  3 mL Intravenous Q12H  . topiramate  25 mg Oral Q2000   Continuous Infusions: PRN Meds:.acetaminophen **OR** acetaminophen, bisacodyl, HYDROmorphone (DILAUDID) injection, HYDROmorphone (DILAUDID) injection, morphine injection, ondansetron **OR** ondansetron (ZOFRAN) IV, ondansetron (ZOFRAN) IV, ondansetron (ZOFRAN) IV, ondansetron (ZOFRAN) IV, sodium chloride flush   Data Review:   Micro Results Recent Results (from the past 240 hour(s))  Respiratory Panel by PCR     Status: None   Collection Time: 12/07/18  1:44 PM  Result Value Ref Range Status   Adenovirus NOT DETECTED NOT DETECTED Final   Coronavirus 229E NOT DETECTED NOT DETECTED Final    Comment: (NOTE) The Coronavirus on the Respiratory Panel, DOES NOT test for the novel  Coronavirus (2019 nCoV)    Coronavirus HKU1 NOT DETECTED NOT DETECTED Final   Coronavirus NL63 NOT DETECTED NOT DETECTED Final   Coronavirus OC43 NOT DETECTED NOT DETECTED Final   Metapneumovirus NOT DETECTED NOT DETECTED Final   Rhinovirus / Enterovirus NOT DETECTED NOT DETECTED Final   Influenza A NOT DETECTED NOT DETECTED Final  Influenza B NOT DETECTED NOT DETECTED Final   Parainfluenza Virus 1 NOT DETECTED NOT DETECTED Final   Parainfluenza Virus 2 NOT DETECTED NOT DETECTED Final   Parainfluenza  Virus 3 NOT DETECTED NOT DETECTED Final   Parainfluenza Virus 4 NOT DETECTED NOT DETECTED Final   Respiratory Syncytial Virus NOT DETECTED NOT DETECTED Final   Bordetella pertussis NOT DETECTED NOT DETECTED Final   Chlamydophila pneumoniae NOT DETECTED NOT DETECTED Final   Mycoplasma pneumoniae NOT DETECTED NOT DETECTED Final    Comment: Performed at [facility], [place] [address] [place], [place] [address]  SARS Coronavirus 2 [facility] order, Performed in [facility] hospital lab)     Status: None   Collection Time: 12/07/18  3:26 PM  Result Value Ref Range Status   SARS Coronavirus 2 NEGATIVE NEGATIVE Final    Comment: (NOTE) If result is NEGATIVE SARS-CoV-2 target nucleic acids are NOT DETECTED. The SARS-CoV-2 RNA is generally detectable in upper and lower  respiratory specimens during the acute phase of infection. The lowest  concentration of SARS-CoV-2 viral copies this assay can detect is 250  copies / mL. A negative result does not preclude SARS-CoV-2 infection  and should not be used as the sole basis for treatment or other  patient management decisions.  A negative result may occur with  improper specimen collection / handling, submission of specimen other  than nasopharyngeal swab, presence of viral mutation(s) within the  areas targeted by this assay, and inadequate number of viral copies  (<250 copies / mL). A negative result must be combined with clinical  observations, patient history, and epidemiological information. If result is POSITIVE SARS-CoV-2 target nucleic acids are DETECTED. The SARS-CoV-2 RNA is generally detectable in upper and lower  respiratory specimens dur ing the acute phase of infection.  Positive  results are indicative of active infection with SARS-CoV-2.  Clinical  correlation with patient history and other diagnostic information is  necessary to determine patient infection status.  Positive results do  not rule out bacterial infection or  co-infection with other viruses. If result is PRESUMPTIVE POSTIVE SARS-CoV-2 nucleic acids MAY BE PRESENT.   A presumptive positive result was obtained on the submitted specimen  and confirmed on repeat testing.  While 2019 novel coronavirus  (SARS-CoV-2) nucleic acids may be present in the submitted sample  additional confirmatory testing may be necessary for epidemiological  and / or clinical management purposes  to differentiate between  SARS-CoV-2 and other Sarbecovirus currently known to infect humans.  If clinically indicated additional testing with an alternate test  methodology [phone]) is advised. The SARS-CoV-2 RNA is generally  detectable in upper and lower respiratory sp ecimens during the acute  phase of infection. The expected result is Negative. Fact Sheet for Patients:  StrictlyIdeas.no Fact Sheet for Healthcare Providers: BankingDealers.co.za This test is not yet approved or cleared by the [place] FDA and has been authorized for detection and/or diagnosis of SARS-CoV-2 by FDA under an Emergency Use Authorization (EUA).  This EUA will remain in effect (meaning this test can be used) for the duration of the COVID-19 declaration under Section 564(b)(1) of the Act, 21 U.S.C. section 360bbb-3(b)(1), unless the authorization is terminated or revoked sooner. Performed at [facility], [address], [place], [place] [address]   SARS Coronavirus 2 [facility] order, Performed in [facility] hospital lab)     Status: None   Collection Time: 12/07/18  6:39 PM  Result Value Ref Range Status  SARS Coronavirus 2 NEGATIVE NEGATIVE Final    Comment: (NOTE) If result is NEGATIVE SARS-CoV-2 target nucleic acids are NOT DETECTED. The SARS-CoV-2 RNA is generally detectable in upper and lower  respiratory specimens during the acute phase of infection. The lowest  concentration of SARS-CoV-2 viral copies this assay can  detect is 250  copies / mL. A negative result does not preclude SARS-CoV-2 infection  and should not be used as the sole basis for treatment or other  patient management decisions.  A negative result may occur with  improper specimen collection / handling, submission of specimen other  than nasopharyngeal swab, presence of viral mutation(s) within the  areas targeted by this assay, and inadequate number of viral copies  (<250 copies / mL). A negative result must be combined with clinical  observations, patient history, and epidemiological information. If result is POSITIVE SARS-CoV-2 target nucleic acids are DETECTED. The SARS-CoV-2 RNA is generally detectable in upper and lower  respiratory specimens dur ing the acute phase of infection.  Positive  results are indicative of active infection with SARS-CoV-2.  Clinical  correlation with patient history and other diagnostic information is  necessary to determine patient infection status.  Positive results do  not rule out bacterial infection or co-infection with other viruses. If result is PRESUMPTIVE POSTIVE SARS-CoV-2 nucleic acids MAY BE PRESENT.   A presumptive positive result was obtained on the submitted specimen  and confirmed on repeat testing.  While 2019 novel coronavirus  (SARS-CoV-2) nucleic acids may be present in the submitted sample  additional confirmatory testing may be necessary for epidemiological  and / or clinical management purposes  to differentiate between  SARS-CoV-2 and other Sarbecovirus currently known to infect humans.  If clinically indicated additional testing with an alternate test  methodology [phone]) is advised. The SARS-CoV-2 RNA is generally  detectable in upper and lower respiratory sp ecimens during the acute  phase of infection. The expected result is Negative. Fact Sheet for Patients:  StrictlyIdeas.no Fact Sheet for Healthcare  Providers: BankingDealers.co.za This test is not yet approved or cleared by the [place] FDA and has been authorized for detection and/or diagnosis of SARS-CoV-2 by FDA under an Emergency Use Authorization (EUA).  This EUA will remain in effect (meaning this test can be used) for the duration of the COVID-19 declaration under Section 564(b)(1) of the Act, 21 U.S.C. section 360bbb-3(b)(1), unless the authorization is terminated or revoked sooner. Performed at [facility], [address] [place], [place] [address]   MRSA PCR Screening     Status: None   Collection Time: 12/07/18 10:40 PM  Result Value Ref Range Status   MRSA by PCR NEGATIVE NEGATIVE Final    Comment:        The GeneXpert MRSA Assay (FDA approved for NASAL specimens only), is one component of a comprehensive MRSA colonization surveillance program. It is not intended to diagnose MRSA infection nor to guide or monitor treatment for MRSA infections. Performed at [facility], [address] [place],  [address]     Radiology Reports Dg Chest [place] 1 View  Result Date: 12/07/2018 CLINICAL DATA:  Shortness of breath EXAM: PORTABLE CHEST 1 VIEW COMPARISON:  October 09, 2018 FINDINGS: There is no appreciable edema or consolidation. There is cardiomegaly with pulmonary vascularity within normal limits. No adenopathy. There is aortic atherosclerosis. No bone lesions. IMPRESSION: Stable cardiac enlargement. No edema or consolidation. Aortic Atherosclerosis (ICD10-I70.0). Electronically Signed   By: Lowella Grip III M.D.  On: 12/07/2018 14:08   Vas [place] Burnard Bunting With/wo Tbi  Result Date: 12/09/2018 LOWER EXTREMITY DOPPLER STUDY Indications: Peripheral artery disease.  Vascular Interventions: 05/18/2018 PTA of the left peroneal artery. Comparison Study: 09/02/2018 Performing Technologist: Charlane Ferretti RT (R)(VS)  Examination Guidelines: A complete evaluation includes at  minimum, Doppler waveform signals and systolic blood pressure reading at the level of bilateral brachial, anterior tibial, and posterior tibial arteries, when vessel segments are accessible. Bilateral testing is considered an integral part of a complete examination. Photoelectric Plethysmograph (PPG) waveforms and toe systolic pressure readings are included as required and additional duplex testing as needed. Limited examinations for reoccurring indications may be performed as noted.  ABI Findings: +---------+------------------+-----+----------+--------+ Right    Rt Pressure (mmHg)IndexWaveform  Comment  +---------+------------------+-----+----------+--------+ Brachial 121                                       +---------+------------------+-----+----------+--------+ ATA      66                0.48 monophasic         +---------+------------------+-----+----------+--------+ PTA      80                0.58 monophasic         +---------+------------------+-----+----------+--------+ Great Toe49                0.36 Abnormal           +---------+------------------+-----+----------+--------+ +---------+------------------+-----+----------+-------+ Left     Lt Pressure (mmHg)IndexWaveform  Comment +---------+------------------+-----+----------+-------+ Brachial 137                                      +---------+------------------+-----+----------+-------+ ATA      107               0.78 monophasic        +---------+------------------+-----+----------+-------+ PTA      100               0.73 monophasic        +---------+------------------+-----+----------+-------+ Great Toe94                0.69 Abnormal          +---------+------------------+-----+----------+-------+ +-------+-----------+-----------+------------+------------+ ABI/TBIToday's ABIToday's TBIPrevious ABIPrevious TBI +-------+-----------+-----------+------------+------------+ Right  .58         .36        .69         .45          +-------+-----------+-----------+------------+------------+ Left   .78        .69        .87         .75          +-------+-----------+-----------+------------+------------+ Bilateral ABIs appear decreased compared to prior study on 09/02/2018. Bilateral TBIs appear decreased compared to prior study on 09/02/2018.  Summary: Right: Resting right ankle-brachial index indicates moderate right lower extremity arterial disease. The right toe-brachial index is abnormal. Left: Resting left ankle-brachial index indicates moderate left lower extremity arterial disease. The left toe-brachial index is abnormal.  *See table(s) above for measurements and observations.  Electronically signed by Hortencia Pilar MD on 12/09/2018 at 4:32:31 PM.   Final      CBC Recent Labs  Lab 12/07/18 1830 12/07/18 2218 12/08/18 0320 12/09/18 0358  WBC 6.1 7.1 7.5  7.9  HGB 11.2* 12.2 12.3 12.3  HCT 38.5 40.5 39.4 39.9  PLT 162 231 223 243  MCV 96.0 91.6 89.5 91.1  MCH 27.9 27.6 28.0 28.1  MCHC 29.1* 30.1 31.2 30.8  RDW 19.6* 19.4* 19.3* 19.6*  LYMPHSABS  --   --   --  2.7  MONOABS  --   --   --  0.4  EOSABS  --   --   --  0.2  BASOSABS  --   --   --  0.0    Chemistries  Recent Labs  Lab 12/07/18 1107 12/07/18 1226 12/07/18 1830  12/08/18 0320 12/08/18 0724 12/09/18 0358 12/10/18 0246 12/10/18 1434  NA 136 135  --   --  136  --  138 139  --   K 3.9 4.1  --   --  3.2*  --  3.4* 4.4  --   CL 101 104  --   --  104  --  105 103  --   CO2 23 21*  --   --  21*  --  21* 24  --   GLUCOSE 89 141* 163*   < > 80 80 80 92 81  BUN 34* 33*  --   --  33*  --  29* 33*  --   CREATININE 1.74* 1.66* 1.35*  --  1.54*  --  1.37* 1.55*  --   CALCIUM 9.7 9.2  --   --  9.3  --  9.3 9.5  --   MG  --   --   --   --   --   --   --  1.9  --    < > = values in this interval not displayed.    ------------------------------------------------------------------------------------------------------------------ estimated creatinine clearance is 26 mL/min (A) (by C-G formula based on SCr of 1.55 mg/dL (H)). ------------------------------------------------------------------------------------------------------------------ No results for input(s): HGBA1C in the last 72 hours. ------------------------------------------------------------------------------------------------------------------ No results for input(s): CHOL, HDL, LDLCALC, TRIG, CHOLHDL, LDLDIRECT in the last 72 hours. ------------------------------------------------------------------------------------------------------------------ No results for input(s): TSH, T4TOTAL, T3FREE, THYROIDAB in the last 72 hours.  Invalid input(s): FREET3 ------------------------------------------------------------------------------------------------------------------ No results for input(s): VITAMINB12, FOLATE, FERRITIN, TIBC, IRON, RETICCTPCT in the last 72 hours.  Coagulation profile No results for input(s): INR, PROTIME in the last 168 hours.  No results for input(s): DDIMER in the last 72 hours.  Cardiac Enzymes No results for input(s): CKMB, TROPONINI, MYOGLOBIN in the last 168 hours.  Invalid input(s): CK ------------------------------------------------------------------------------------------------------------------ Invalid input(s): POCBNP    Assessment & Plan   85 year old female patient history of peripheral artery disease, neuropathy, GERD for leg angiogram by vascular development of hypoxia medicine is consulted for evaluation. 1.  Acute respiratory failure with hypoxia, this is due to acute CHF now improved 2.    Acute on chronic diastolic CHF exacerbation change to oral Lasix 3.    Hypoglycemia still continues to be low I will check insulin C-peptide levels 4.  Previous history of stroke continue Plavix and aspirin 5.   Hyperlipidemia continue Lipitor 6.  Hypothyroidism continue Synthroid 7.  Vascular dementia currently stable 8.  Peripheral vascular disease s/p intevention, plan to keep overnight and heprinizeher 9.  CODE STATUS DNR      Code Status Orders  (From admission, onward)         Start     Ordered   12/07/18 1508  Do not attempt resuscitation (DNR)  Continuous    Question Answer Comment  In the event of  cardiac or respiratory ARREST Do not call a "code blue"   In the event of cardiac or respiratory ARREST Do not perform Intubation, CPR, defibrillation or ACLS   In the event of cardiac or respiratory ARREST Use medication by any route, position, wound care, and other measures to relive pain and suffering. May use oxygen, suction and manual treatment of airway obstruction as needed for comfort.   Comments NMP      12/07/18 1508        Code Status History    Date Active Date Inactive Code Status Order ID Comments User Context   12/07/2018 1508 12/07/2018 1508 Full Code [id number]  Epifanio Lesches, MD Inpatient   10/09/2018 1701 10/13/2018 1736 DNR [id number]  Henreitta Leber, MD Inpatient   05/18/2018 1259 05/18/2018 1739 Full Code [id number]  Katha Cabal, MD Inpatient   02/23/2018 1201 02/23/2018 1644 Full Code [id number]  Katha Cabal, MD Inpatient   12/09/2017 1646 12/11/2017 1850 DNR [id number]  Demetrios Loll, MD Inpatient   05/03/2017 0349 05/04/2017 2209 Full Code [id number]  Saundra Shelling, MD Inpatient   10/07/2016 1203 10/07/2016 1738 Full Code [id number]  Delana Meyer, Dolores Lory, MD Inpatient   09/16/2016 1027 09/16/2016 1711 Full Code [id number]  Katha Cabal, MD Inpatient   09/02/2016 1051 09/02/2016 1601 Full Code [id number]  Katha Cabal, MD Inpatient   07/22/2016 1002 07/22/2016 2318 DNR [id number]  Fritzi Mandes, MD Inpatient   07/20/2016 1455 07/22/2016 1002 Full Code [id number]  Hillary Bow, MD ED    Advance Directive Documentation     Most Recent Value  Type of Advance  Directive  Healthcare Power of Attorney  Pre-existing out of facility DNR order (yellow form or pink MOST form)  -  "MOST" Form in Place?  -           Consults vascular  DVT Prophylaxis  Lovenox   Lab Results  Component Value Date   PLT 243 12/09/2018     Time Spent in minutes 31min Greater than 50% of time spent in care coordination and counseling patient regarding the condition and plan of care.   Dustin Flock M.D on 12/10/2018 at 3:05 PM  Between 7am to 6pm - Pager - [phone]  After 6pm go to [email or web address] - Proofreader  [employer]   Office  [phone]

## 2018-12-11 LAB — CBC
HCT: 43.2 % (ref 36.0–46.0)
Hemoglobin: 13.3 g/dL (ref 12.0–15.0)
MCH: 27.9 pg (ref 26.0–34.0)
MCHC: 30.8 g/dL (ref 30.0–36.0)
MCV: 90.6 fL (ref 80.0–100.0)
Platelets: 232 10*3/uL (ref 150–400)
RBC: 4.77 MIL/uL (ref 3.87–5.11)
RDW: 20 % — ABNORMAL HIGH (ref 11.5–15.5)
WBC: 8.4 10*3/uL (ref 4.0–10.5)
nRBC: 0 % (ref 0.0–0.2)

## 2018-12-11 LAB — BASIC METABOLIC PANEL
Anion gap: 11 (ref 5–15)
BUN: 34 mg/dL — ABNORMAL HIGH (ref 8–23)
CO2: 26 mmol/L (ref 22–32)
Calcium: 9.7 mg/dL (ref 8.9–10.3)
Chloride: 98 mmol/L (ref 98–111)
Creatinine, Ser: 1.49 mg/dL — ABNORMAL HIGH (ref 0.44–1.00)
GFR calc Af Amer: 36 mL/min — ABNORMAL LOW (ref >60)
GFR calc non Af Amer: 31 mL/min — ABNORMAL LOW (ref >60)
Glucose, Bld: 93 mg/dL (ref 70–99)
Potassium: 4.1 mmol/L (ref 3.5–5.1)
Sodium: 135 mmol/L (ref 135–145)

## 2018-12-11 LAB — GLUCOSE, CAPILLARY
Glucose-Capillary: 92 mg/dL (ref 70–99)
Glucose-Capillary: 112 mg/dL — ABNORMAL HIGH (ref 70–99)

## 2018-12-11 NOTE — Progress Notes (Signed)
Patient transferred back to [facility] home. Report called and given by Benay Pillow.  Transferred vis EMS.  EMS states that they cannot bring patient wheelchair back on the ambulance. This RN called [facility] and they stated that they can pick up wheelchair on Monday.  They were made aware that the chair would be on 2A and once they came to medical mall, to call [facility].

## 2018-12-11 NOTE — Progress Notes (Signed)
Called patient son, Mr. Ashok Pall, informed about patient being discharged today.

## 2018-12-11 NOTE — TOC Transition Note (Signed)
Transition of Care [facility]) - CM/SW Discharge Note   Patient Details  Name: ROSIO WEISS MRN: [id number] Date of Birth: [birth year]  [facility]) CM/SW Contact:  Annamaria Boots, [place] Phone Number: 12/11/2018, 11:13 AM   Clinical Narrative:   Patient is medically ready for discharge today. CSW notified [facility] of discharge today CSW also notified patient's son Kasandra Knudsen of discharge today. Patient will be transported by EMS. RN to call report and call for transport.     Final next level of care: [place] Barriers to Discharge: No Barriers Identified   Patient Goals and CMS Choice Patient states their goals for this hospitalization and ongoing recovery are:: To return back to [facility] [employer]) where she is a long term care resident. CMS Medicare.gov Compare Post Acute Care list provided to:: Patient Represenative (must comment)(Son ) Choice offered to / list presented to : Adult Children  Discharge Placement              Patient chooses bed at: [facility] Patient to be transferred to facility by: EMS Name of family member notified: Son- Ashok Pall  Patient and family notified of of transfer: 12/11/18  Discharge Plan and Services In-house Referral: Clinical Social Work   Post Acute Care Choice: [place]                               Social Determinants of Health (SDOH) Interventions     Readmission Risk Interventions Readmission Risk Prevention Plan 12/10/2018  Transportation Screening Complete  PCP or Specialist Appt within 3-5 Days Complete  Social Work Consult for [place] Planning/Counseling Complete  Palliative Care Screening Not Applicable  Medication Review Press photographer) Complete  Some recent data might be hidden

## 2018-12-11 NOTE — Discharge Summary (Addendum)
[name], is a 85 y.o. female  DOB August 31, 1927  MRN [id number].  Admission date:  12/07/2018  Admitting Physician  Epifanio Lesches, MD  Discharge Date:  12/11/2018   Primary MD  Rosaland Lao, NP  Recommendations for primary care physician for things to follow:  Follow-up with PCP in 1 week   Admission Diagnosis  Acute respiratory failure with hypoxemia ([place]) [J96.01]   Discharge Diagnosis  Acute respiratory failure with hypoxemia (HCC) [J96.01]    Active Problems:   Acute respiratory failure with hypoxemia [facility])      Past Medical History:  Diagnosis Date  . Abnormal glucose   . Anxiety   . Arthritis   . Asthma   . Calculus of gallbladder without mention of cholecystitis, with obstruction   . Cancer ([place])    kidney  . Candidiasis of the esophagus   . Cataract cortical, senile   . CHF (congestive heart failure) ([place])   . Chronic kidney disease, stage III (moderate) (HCC)   . Chronic pain   . Chronic pain syndrome   . Corns and callosities   . Debility   . Depression   . Dermatophytosis of nail   . Diabetes mellitus without complication ([place])    unspecified, without mention of complication  . Difficult intubation   . Dysphagia   . Dyspnea    with exertion  . Edema, unspecified   . Emphysema lung ([place])   . Emphysema of lung ([place])   . Esophageal reflux   . Essential hypertension, benign   . Generalized atherosclerosis   . Glaucoma (increased eye pressure)   . Gout, unspecified   . Hemiplegia ([place])   . Hereditary and idiopathic peripheral neuropathy   . Hyperglycemia   . Hyperlipidemia   . Hypertension   . Hypoglycemia, unspecified   . Hypopotassemia   . Hypothyroidism   . Migraine, unspecified, without mention of intractable migraine without mention of status migrainosus   . Other  vitamin B12 deficiency anemia   . Pain in limb   . Peripheral vascular disease ([place])   . Personal history of fall   . Stroke ([place])   . Thyroid disease   . Ulcer of foot ([place])   . Unspecified asthma(493.90)   . Unspecified constipation   . Unspecified hypertensive heart disease with heart failure(402.91)   . Unspecified urinary incontinence   . Unspecified vitamin D deficiency     Past Surgical History:  Procedure Laterality Date  . ABDOMINAL HYSTERECTOMY    . arterial thrombi stents in both legs    . BACK SURGERY    . BACK SURGERY    . CATARACT EXTRACTION    . COLONOSCOPY    . ESOPHAGOGASTRODUODENOSCOPY (EGD) WITH PROPOFOL N/A 06/19/2016   Procedure: ESOPHAGOGASTRODUODENOSCOPY (EGD) WITH PROPOFOL;  Surgeon: Lollie Sails, MD;  Location: [facility] ENDOSCOPY;  Service: Endoscopy;  Laterality: N/A;  . ESOPHAGOGASTRODUODENOSCOPY (EGD) WITH PROPOFOL N/A 04/10/2017   Procedure: ESOPHAGOGASTRODUODENOSCOPY (EGD) WITH PROPOFOL;  Surgeon: Lollie Sails, MD;  Location: [facility] ENDOSCOPY;  Service: Endoscopy;  Laterality: N/A;  . EYE SURGERY     cataract extraction  . LOWER EXTREMITY ANGIOGRAPHY Right 10/07/2016   Procedure: Lower Extremity Angiography;  Surgeon: Katha Cabal, MD;  Location: [place] CV LAB;  Service: Cardiovascular;  Laterality: Right;  . LOWER EXTREMITY ANGIOGRAPHY Right 02/23/2018   Procedure: LOWER EXTREMITY ANGIOGRAPHY;  Surgeon: Katha Cabal, MD;  Location: [facility];  Service: Cardiovascular;  Laterality: Right;  .  LOWER EXTREMITY ANGIOGRAPHY Left 05/18/2018   Procedure: LOWER EXTREMITY ANGIOGRAPHY;  Surgeon: Katha Cabal, MD;  Location: [place] [facility];  Service: Cardiovascular;  Laterality: Left;  . PERIPHERAL VASCULAR CATHETERIZATION Left 09/02/2016   Procedure: Lower Extremity Angiography;  Surgeon: Katha Cabal, MD;  Location: [place] [facility];  Service: Cardiovascular;  Laterality: Left;  . PERIPHERAL VASCULAR  CATHETERIZATION Right 09/16/2016   Procedure: Lower Extremity Angiography;  Surgeon: Katha Cabal, MD;  Location: [place] [facility];  Service: Cardiovascular;  Laterality: Right;       History of present illness and  Hospital Course:     Kindly see H&P for history of present illness and admission details, please review complete Labs, Consult reports and Test reports for all details in brief  HPI  from the history and physical done on the day of admission 85 year old female patient with history of peripheral artery disease, previous stroke, hyperlipidemia, hypothyroidism admitted because of shortness of breath with hypoxia found to have acute on chronic diastolic heart failure.   Hospital Course   #1 acute respiratory failure with hypoxia with O2 sats 70% on room air, required oxygen support up to 4 to 5 L, patient found to have acute on chronic diastolic CHF exacerbation, started on IV diuretics, hypoxia improved slowly now she is on room air and saturation is 90%.  Plan is to continue Lasix 40 mg daily but she is taking at home. 2.  Peripheral artery disease, patient supposed to have right leg angiogram but that has been canceled because patient developed hypoxia.  Patient has been followed by vascular surgery, had right leg AngioJet angioplasty yesterday.  Patient had history of bilateral lower extremity ulcers with gangrene of the right leg.  Patient had successful Leaphart revascularization yesterday.  Regarding peripheral artery disease , continue aspirin, Plavix, statins. 3.  Hypoglycemia without history of diabetes secondary to poor p.o. intake, resolved.. 4.  Neuropathy patient is on Cymbalta, Neurontin. #5 chronic atrial fibrillation, patient is on high-dose metoprolol at home but heart rate is around 55 I decreased the metoprolol tartrate to 200 mg p.o. twice daily.    #6 chronic renal failure improved, continue low-dose diuretics, potassium supplements. #7/ acute on chronic  diastolic heart failure EF is 50 to 55%.  Acute CHF improved with IV diuretics. Discharge Condition: Stable   Follow UP   Contact information for follow-up providers    Rosaland Lao, NP.   Specialty:  Nurse Practitioner Why:  office is closed Contact information: [address] [place] [phone]        Schnier, Dolores Lory, MD On 12/23/2018.   Specialties:  Vascular Surgery, Cardiology, Radiology, Vascular Surgery Why:  appointment @10 :45am Contact information: [place] [address] [phone]            Contact information for after-discharge care    Destination    [facility] .   Service:  Skilled Nursing Contact information: [address] [place] [phone]                    Discharge Instructions  and  Discharge Medications     Discharge Instructions    Diet - low sodium heart healthy   Complete by:  As directed    Increase activity slowly   Complete by:  As directed      Allergies as of 12/11/2018   No Known Allergies     Medication  List    TAKE these medications   acetaminophen 325 MG tablet Commonly known as:  TYLENOL Take 650 mg by mouth every 4 (four) hours as needed for mild pain.   allopurinol 100 MG tablet Commonly known as:  ZYLOPRIM Take 100 mg by mouth daily.   amitriptyline 25 MG tablet Commonly known as:  ELAVIL Take 12.5 mg by mouth at bedtime.   aspirin 81 MG EC tablet Take 1 tablet (81 mg total) by mouth daily.   atorvastatin 20 MG tablet Commonly known as:  LIPITOR Take 20 mg by mouth daily.   clopidogrel 75 MG tablet Commonly known as:  Plavix Take 1 tablet (75 mg total) by mouth daily.   DULoxetine 30 MG capsule Commonly known as:  CYMBALTA Take 30 mg by mouth every morning.   feeding supplement (PRO-STAT SUGAR FREE 64) Liqd Take 30 mLs by mouth at bedtime.   furosemide 40 MG tablet Commonly known as:   LASIX Take 40 mg by mouth daily.   gabapentin 400 MG capsule Commonly known as:  NEURONTIN Take 400 mg by mouth 2 (two) times daily.   glucose 4 GM chewable tablet Chew 1 tablet (4 g total) by mouth 3 (three) times daily.   latanoprost 0.005 % ophthalmic solution Commonly known as:  XALATAN Place 1 drop into both eyes at bedtime.   levothyroxine 25 MCG tablet Commonly known as:  SYNTHROID Take 25 mcg by mouth daily before breakfast.   metoprolol tartrate 100 MG tablet Commonly known as:  LOPRESSOR Take 200 mg by mouth 2 (two) times daily.   metoprolol tartrate 25 MG tablet Commonly known as:  LOPRESSOR Take 25 mg by mouth 2 (two) times daily. Pt takes 225 mg daily   pantoprazole 40 MG tablet Commonly known as:  PROTONIX Take 40 mg by mouth daily.   potassium chloride 10 MEQ tablet Commonly known as:  K-DUR Take 10 mEq by mouth daily.   senna 8.6 MG tablet Commonly known as:  SENOKOT Take 2 tablets by mouth 2 (two) times daily.   topiramate 25 MG tablet Commonly known as:  TOPAMAX Take 25 mg by mouth at bedtime.   traMADol 50 MG tablet Commonly known as:  ULTRAM Take 1 tablet (50 mg total) by mouth every 8 (eight) hours as needed (for pain control). What changed:  when to take this   Vitamin D-3 25 MCG (1000 UT) Caps Take 2,000 Units by mouth daily.         Diet and Activity recommendation: See Discharge Instructions above   Consults obtained -vascular surgery, intensivist   Major procedures and Radiology Reports - PLEASE review detailed and final reports for all details, in brief -      Dg Chest Port 1 View  Result Date: 12/07/2018 CLINICAL DATA:  Shortness of breath EXAM: PORTABLE CHEST 1 VIEW COMPARISON:  October 09, 2018 FINDINGS: There is no appreciable edema or consolidation. There is cardiomegaly with pulmonary vascularity within normal limits. No adenopathy. There is aortic atherosclerosis. No bone lesions. IMPRESSION: Stable cardiac  enlargement. No edema or consolidation. Aortic Atherosclerosis (ICD10-I70.0). Electronically Signed   By: Lowella Grip III M.D.   On: 12/07/2018 14:08   Vas [place] Burnard Bunting With/wo Tbi  Result Date: 12/09/2018 LOWER EXTREMITY DOPPLER STUDY Indications: Peripheral artery disease.  Vascular Interventions: 05/18/2018 PTA of the left peroneal artery. Comparison Study: 09/02/2018 Performing Technologist: Charlane Ferretti RT (R)(VS)  Examination Guidelines: A complete evaluation includes at minimum, Doppler waveform signals and systolic blood pressure  reading at the level of bilateral brachial, anterior tibial, and posterior tibial arteries, when vessel segments are accessible. Bilateral testing is considered an integral part of a complete examination. Photoelectric Plethysmograph (PPG) waveforms and toe systolic pressure readings are included as required and additional duplex testing as needed. Limited examinations for reoccurring indications may be performed as noted.  ABI Findings: +---------+------------------+-----+----------+--------+ Right    Rt Pressure (mmHg)IndexWaveform  Comment  +---------+------------------+-----+----------+--------+ Brachial 121                                       +---------+------------------+-----+----------+--------+ ATA      66                0.48 monophasic         +---------+------------------+-----+----------+--------+ PTA      80                0.58 monophasic         +---------+------------------+-----+----------+--------+ Great Toe49                0.36 Abnormal           +---------+------------------+-----+----------+--------+ +---------+------------------+-----+----------+-------+ Left     Lt Pressure (mmHg)IndexWaveform  Comment +---------+------------------+-----+----------+-------+ Brachial 137                                      +---------+------------------+-----+----------+-------+ ATA      107               0.78 monophasic         +---------+------------------+-----+----------+-------+ PTA      100               0.73 monophasic        +---------+------------------+-----+----------+-------+ Great Toe94                0.69 Abnormal          +---------+------------------+-----+----------+-------+ +-------+-----------+-----------+------------+------------+ ABI/TBIToday's ABIToday's TBIPrevious ABIPrevious TBI +-------+-----------+-----------+------------+------------+ Right  .58        .36        .69         .45          +-------+-----------+-----------+------------+------------+ Left   .78        .69        .87         .75          +-------+-----------+-----------+------------+------------+ Bilateral ABIs appear decreased compared to prior study on 09/02/2018. Bilateral TBIs appear decreased compared to prior study on 09/02/2018.  Summary: Right: Resting right ankle-brachial index indicates moderate right lower extremity arterial disease. The right toe-brachial index is abnormal. Left: Resting left ankle-brachial index indicates moderate left lower extremity arterial disease. The left toe-brachial index is abnormal.  *See table(s) above for measurements and observations.  Electronically signed by Hortencia Pilar MD on 12/09/2018 at 4:32:31 PM.   Final     Micro Results     Recent Results (from the past 240 hour(s))  Respiratory Panel by PCR     Status: None   Collection Time: 12/07/18  1:44 PM  Result Value Ref Range Status   Adenovirus NOT DETECTED NOT DETECTED Final   Coronavirus 229E NOT DETECTED NOT DETECTED Final    Comment: (NOTE) The Coronavirus on the Respiratory Panel, DOES NOT test for the novel  Coronavirus (2019 nCoV)  Coronavirus HKU1 NOT DETECTED NOT DETECTED Final   Coronavirus NL63 NOT DETECTED NOT DETECTED Final   Coronavirus OC43 NOT DETECTED NOT DETECTED Final   Metapneumovirus NOT DETECTED NOT DETECTED Final   Rhinovirus / Enterovirus NOT DETECTED NOT DETECTED Final    Influenza A NOT DETECTED NOT DETECTED Final   Influenza B NOT DETECTED NOT DETECTED Final   Parainfluenza Virus 1 NOT DETECTED NOT DETECTED Final   Parainfluenza Virus 2 NOT DETECTED NOT DETECTED Final   Parainfluenza Virus 3 NOT DETECTED NOT DETECTED Final   Parainfluenza Virus 4 NOT DETECTED NOT DETECTED Final   Respiratory Syncytial Virus NOT DETECTED NOT DETECTED Final   Bordetella pertussis NOT DETECTED NOT DETECTED Final   Chlamydophila pneumoniae NOT DETECTED NOT DETECTED Final   Mycoplasma pneumoniae NOT DETECTED NOT DETECTED Final    Comment: Performed at [facility], [place] [address] [place], [place] [address]  SARS Coronavirus 2 [facility] order, Performed in [facility] hospital lab)     Status: None   Collection Time: 12/07/18  3:26 PM  Result Value Ref Range Status   SARS Coronavirus 2 NEGATIVE NEGATIVE Final    Comment: (NOTE) If result is NEGATIVE SARS-CoV-2 target nucleic acids are NOT DETECTED. The SARS-CoV-2 RNA is generally detectable in upper and lower  respiratory specimens during the acute phase of infection. The lowest  concentration of SARS-CoV-2 viral copies this assay can detect is 250  copies / mL. A negative result does not preclude SARS-CoV-2 infection  and should not be used as the sole basis for treatment or other  patient management decisions.  A negative result may occur with  improper specimen collection / handling, submission of specimen other  than nasopharyngeal swab, presence of viral mutation(s) within the  areas targeted by this assay, and inadequate number of viral copies  (<250 copies / mL). A negative result must be combined with clinical  observations, patient history, and epidemiological information. If result is POSITIVE SARS-CoV-2 target nucleic acids are DETECTED. The SARS-CoV-2 RNA is generally detectable in upper and lower  respiratory specimens dur ing the acute phase of infection.  Positive  results are indicative of active  infection with SARS-CoV-2.  Clinical  correlation with patient history and other diagnostic information is  necessary to determine patient infection status.  Positive results do  not rule out bacterial infection or co-infection with other viruses. If result is PRESUMPTIVE POSTIVE SARS-CoV-2 nucleic acids MAY BE PRESENT.   A presumptive positive result was obtained on the submitted specimen  and confirmed on repeat testing.  While 2019 novel coronavirus  (SARS-CoV-2) nucleic acids may be present in the submitted sample  additional confirmatory testing may be necessary for epidemiological  and / or clinical management purposes  to differentiate between  SARS-CoV-2 and other Sarbecovirus currently known to infect humans.  If clinically indicated additional testing with an alternate test  methodology [phone]) is advised. The SARS-CoV-2 RNA is generally  detectable in upper and lower respiratory sp ecimens during the acute  phase of infection. The expected result is Negative. Fact Sheet for Patients:  StrictlyIdeas.no Fact Sheet for Healthcare Providers: BankingDealers.co.za This test is not yet approved or cleared by the [place] FDA and has been authorized for detection and/or diagnosis of SARS-CoV-2 by FDA under an Emergency Use Authorization (EUA).  This EUA will remain in effect (meaning this test can be used) for the duration of the COVID-19 declaration under Section 564(b)(1) of the Act, 21 U.S.C. section 360bbb-3(b)(1), unless  the authorization is terminated or revoked sooner. Performed at [facility], [address] [place], [place] [address]   SARS Coronavirus 2 [facility] order, Performed in [place] hospital lab)     Status: None   Collection Time: 12/07/18  6:39 PM  Result Value Ref Range Status   SARS Coronavirus 2 NEGATIVE NEGATIVE Final    Comment: (NOTE) If result is NEGATIVE SARS-CoV-2 target nucleic  acids are NOT DETECTED. The SARS-CoV-2 RNA is generally detectable in upper and lower  respiratory specimens during the acute phase of infection. The lowest  concentration of SARS-CoV-2 viral copies this assay can detect is 250  copies / mL. A negative result does not preclude SARS-CoV-2 infection  and should not be used as the sole basis for treatment or other  patient management decisions.  A negative result may occur with  improper specimen collection / handling, submission of specimen other  than nasopharyngeal swab, presence of viral mutation(s) within the  areas targeted by this assay, and inadequate number of viral copies  (<250 copies / mL). A negative result must be combined with clinical  observations, patient history, and epidemiological information. If result is POSITIVE SARS-CoV-2 target nucleic acids are DETECTED. The SARS-CoV-2 RNA is generally detectable in upper and lower  respiratory specimens dur ing the acute phase of infection.  Positive  results are indicative of active infection with SARS-CoV-2.  Clinical  correlation with patient history and other diagnostic information is  necessary to determine patient infection status.  Positive results do  not rule out bacterial infection or co-infection with other viruses. If result is PRESUMPTIVE POSTIVE SARS-CoV-2 nucleic acids MAY BE PRESENT.   A presumptive positive result was obtained on the submitted specimen  and confirmed on repeat testing.  While 2019 novel coronavirus  (SARS-CoV-2) nucleic acids may be present in the submitted sample  additional confirmatory testing may be necessary for epidemiological  and / or clinical management purposes  to differentiate between  SARS-CoV-2 and other Sarbecovirus currently known to infect humans.  If clinically indicated additional testing with an alternate test  methodology [phone]) is advised. The SARS-CoV-2 RNA is generally  detectable in upper and lower respiratory  sp ecimens during the acute  phase of infection. The expected result is Negative. Fact Sheet for Patients:  StrictlyIdeas.no Fact Sheet for Healthcare Providers: BankingDealers.co.za This test is not yet approved or cleared by the [place] FDA and has been authorized for detection and/or diagnosis of SARS-CoV-2 by FDA under an Emergency Use Authorization (EUA).  This EUA will remain in effect (meaning this test can be used) for the duration of the COVID-19 declaration under Section 564(b)(1) of the Act, 21 U.S.C. section 360bbb-3(b)(1), unless the authorization is terminated or revoked sooner. Performed at [facility], [address] [place], [place] [address]   MRSA PCR Screening     Status: None   Collection Time: 12/07/18 10:40 PM  Result Value Ref Range Status   MRSA by PCR NEGATIVE NEGATIVE Final    Comment:        The GeneXpert MRSA Assay (FDA approved for NASAL specimens only), is one component of a comprehensive MRSA colonization surveillance program. It is not intended to diagnose MRSA infection nor to guide or monitor treatment for MRSA infections. Performed at [facility], [address] [place], [place] [address]        Today   Subjective:   [name] stable for discharge back to [facility] home today.  Objective:   Blood pressure (!) 101/57, pulse 73, temperature 98.5 F (36.9 C), temperature source Oral, resp. rate 19, height 5\' 6"  (1.676 m), weight 74.7 kg, SpO2 92 %.   Intake/Output Summary (Last 24 hours) at 12/11/2018 1037 Last data filed at 12/11/2018 0502 Gross per 24 hour  Intake 564.55 ml  Output 1150 ml  Net -585.45 ml    Exam Awake Alert, Oriented x 3, No new F.N deficits, Normal affect [place].AT,PERRAL Supple Neck,No JVD, No cervical lymphadenopathy appriciated.  Symmetrical Chest wall movement, Good air movement bilaterally, CTAB RRR,No Gallops,Rubs or  new Murmurs, No Parasternal Heave +ve B.Sounds, Abd Soft, Non tender, No organomegaly appriciated, No rebound -guarding or rigidity. Bilateral nonhealing ulcers  Data Review   CBC w Diff:  Lab Results  Component Value Date   WBC 8.4 12/11/2018   HGB 13.3 12/11/2018   HGB 12.0 09/18/2014   HCT 43.2 12/11/2018   HCT 37.2 09/18/2014   PLT 232 12/11/2018   PLT 254 09/18/2014   LYMPHOPCT 34 12/09/2018   LYMPHOPCT 26.9 09/18/2014   MONOPCT 5 12/09/2018   MONOPCT 7.6 09/18/2014   EOSPCT 3 12/09/2018   EOSPCT 4.5 09/18/2014   BASOPCT 0 12/09/2018   BASOPCT 0.4 09/18/2014    CMP:  Lab Results  Component Value Date   NA 135 12/11/2018   NA 139 09/22/2014   K 4.1 12/11/2018   K 4.4 09/22/2014   CL 98 12/11/2018   CL 109 (H) 09/22/2014   CO2 26 12/11/2018   CO2 24 09/22/2014   BUN 34 (H) 12/11/2018   BUN 9 09/22/2014   CREATININE 1.49 (H) 12/11/2018   CREATININE 0.97 09/22/2014   PROT 7.2 10/09/2018   PROT 8.1 09/14/2014   ALBUMIN 3.1 (L) 10/09/2018   ALBUMIN 2.9 (L) 09/14/2014   BILITOT 0.8 10/09/2018   BILITOT 0.5 09/14/2014   ALKPHOS 136 (H) 10/09/2018   ALKPHOS 145 (H) 09/14/2014   AST 25 10/09/2018   AST 23 09/14/2014   ALT 7 10/09/2018   ALT 9 (L) 09/14/2014  .   Total Time in preparing paper work, data evaluation and todays exam - 35 minutes  Epifanio Lesches M.D on 12/11/2018 at 10:37 AM    Note: This dictation was prepared with Dragon dictation along with smaller phrase technology. Any transcriptional errors that result from this process are unintentional.

## 2018-12-11 NOTE — Progress Notes (Signed)
Groin site at shift change had a small amount of bloody drainage on the dressing. On reassessment the amount had expanded moderately through the night and at the end of the shift the gauze was saturated with no other drainage noted. Dressing changed and a small amount of oozing was noted at the site. New pressure dressing with gauze and Tegaderm applied. Will continue to monitor.

## 2018-12-13 ENCOUNTER — Encounter: Payer: Self-pay | Admitting: Vascular Surgery

## 2018-12-14 ENCOUNTER — Other Ambulatory Visit (INDEPENDENT_AMBULATORY_CARE_PROVIDER_SITE_OTHER): Payer: Self-pay | Admitting: Vascular Surgery

## 2018-12-14 ENCOUNTER — Encounter: Payer: Self-pay | Admitting: Anesthesiology

## 2018-12-14 ENCOUNTER — Ambulatory Visit
Admission: RE | Admit: 2018-12-14 | Discharge: 2018-12-14 | Disposition: A | Discharge status: Home / Self Care | Payer: Medicare Other | Attending: Vascular Surgery | Admitting: Vascular Surgery

## 2018-12-14 ENCOUNTER — Other Ambulatory Visit: Payer: Self-pay

## 2018-12-14 ENCOUNTER — Encounter
Admission: RE | Disposition: A | Discharge status: Home / Self Care | Payer: Self-pay | Source: Home / Self Care | Attending: Vascular Surgery

## 2018-12-14 DIAGNOSIS — Z7982 Long term (current) use of aspirin: Secondary | ICD-10-CM | POA: Insufficient documentation

## 2018-12-14 DIAGNOSIS — K219 Gastro-esophageal reflux disease without esophagitis: Secondary | ICD-10-CM | POA: Insufficient documentation

## 2018-12-14 DIAGNOSIS — Z95820 Peripheral vascular angioplasty status with implants and grafts: Secondary | ICD-10-CM | POA: Insufficient documentation

## 2018-12-14 DIAGNOSIS — Z90710 Acquired absence of both cervix and uterus: Secondary | ICD-10-CM | POA: Insufficient documentation

## 2018-12-14 DIAGNOSIS — I890 Lymphedema, not elsewhere classified: Secondary | ICD-10-CM | POA: Diagnosis not present

## 2018-12-14 DIAGNOSIS — M1990 Unspecified osteoarthritis, unspecified site: Secondary | ICD-10-CM | POA: Diagnosis not present

## 2018-12-14 DIAGNOSIS — I509 Heart failure, unspecified: Secondary | ICD-10-CM | POA: Insufficient documentation

## 2018-12-14 DIAGNOSIS — Z823 Family history of stroke: Secondary | ICD-10-CM | POA: Insufficient documentation

## 2018-12-14 DIAGNOSIS — I70245 Atherosclerosis of native arteries of left leg with ulceration of other part of foot: Secondary | ICD-10-CM | POA: Insufficient documentation

## 2018-12-14 DIAGNOSIS — M109 Gout, unspecified: Secondary | ICD-10-CM | POA: Insufficient documentation

## 2018-12-14 DIAGNOSIS — L97529 Non-pressure chronic ulcer of other part of left foot with unspecified severity: Secondary | ICD-10-CM | POA: Insufficient documentation

## 2018-12-14 DIAGNOSIS — E1142 Type 2 diabetes mellitus with diabetic polyneuropathy: Secondary | ICD-10-CM | POA: Diagnosis not present

## 2018-12-14 DIAGNOSIS — Z7902 Long term (current) use of antithrombotics/antiplatelets: Secondary | ICD-10-CM | POA: Diagnosis not present

## 2018-12-14 DIAGNOSIS — I70299 Other atherosclerosis of native arteries of extremities, unspecified extremity: Secondary | ICD-10-CM

## 2018-12-14 DIAGNOSIS — E039 Hypothyroidism, unspecified: Secondary | ICD-10-CM | POA: Diagnosis not present

## 2018-12-14 DIAGNOSIS — E1122 Type 2 diabetes mellitus with diabetic chronic kidney disease: Secondary | ICD-10-CM | POA: Diagnosis not present

## 2018-12-14 DIAGNOSIS — E11621 Type 2 diabetes mellitus with foot ulcer: Secondary | ICD-10-CM | POA: Insufficient documentation

## 2018-12-14 DIAGNOSIS — N183 Chronic kidney disease, stage 3 (moderate): Secondary | ICD-10-CM | POA: Diagnosis not present

## 2018-12-14 DIAGNOSIS — Z8673 Personal history of transient ischemic attack (TIA), and cerebral infarction without residual deficits: Secondary | ICD-10-CM | POA: Insufficient documentation

## 2018-12-14 DIAGNOSIS — I130 Hypertensive heart and chronic kidney disease with heart failure and stage 1 through stage 4 chronic kidney disease, or unspecified chronic kidney disease: Secondary | ICD-10-CM | POA: Diagnosis not present

## 2018-12-14 DIAGNOSIS — E782 Mixed hyperlipidemia: Secondary | ICD-10-CM | POA: Insufficient documentation

## 2018-12-14 DIAGNOSIS — Z1159 Encounter for screening for other viral diseases: Secondary | ICD-10-CM | POA: Insufficient documentation

## 2018-12-14 DIAGNOSIS — H409 Unspecified glaucoma: Secondary | ICD-10-CM | POA: Diagnosis not present

## 2018-12-14 DIAGNOSIS — L97909 Non-pressure chronic ulcer of unspecified part of unspecified lower leg with unspecified severity: Secondary | ICD-10-CM

## 2018-12-14 DIAGNOSIS — Z79890 Hormone replacement therapy (postmenopausal): Secondary | ICD-10-CM | POA: Diagnosis not present

## 2018-12-14 DIAGNOSIS — Z79899 Other long term (current) drug therapy: Secondary | ICD-10-CM | POA: Insufficient documentation

## 2018-12-14 DIAGNOSIS — L97401 Non-pressure chronic ulcer of unspecified heel and midfoot limited to breakdown of skin: Secondary | ICD-10-CM | POA: Diagnosis not present

## 2018-12-14 DIAGNOSIS — I70244 Atherosclerosis of native arteries of left leg with ulceration of heel and midfoot: Secondary | ICD-10-CM | POA: Diagnosis not present

## 2018-12-14 HISTORY — PX: LOWER EXTREMITY ANGIOGRAPHY: CATH118251

## 2018-12-14 LAB — GLUCOSE, CAPILLARY
Glucose-Capillary: 93 mg/dL (ref 70–99)
Glucose-Capillary: 84 mg/dL (ref 70–99)

## 2018-12-14 LAB — SARS CORONAVIRUS 2 BY RT PCR (HOSPITAL ORDER, PERFORMED IN ~~LOC~~ HOSPITAL LAB): SARS Coronavirus 2: NEGATIVE

## 2018-12-14 SURGERY — LOWER EXTREMITY ANGIOGRAPHY
Anesthesia: Moderate Sedation | Laterality: Left

## 2018-12-14 MED ORDER — HEPARIN (PORCINE) IN NACL 1000-0.9 UT/500ML-% IV SOLN
INTRAVENOUS | Status: AC
  Filled 2018-12-14: qty 1000

## 2018-12-14 MED ORDER — MIDAZOLAM HCL 2 MG/ML PO SYRP: 8.0000 mg | ORAL_SOLUTION | Freq: Once | ORAL | Status: DC | PRN

## 2018-12-14 MED ORDER — MIDAZOLAM HCL 5 MG/5ML IJ SOLN
INTRAMUSCULAR | Status: AC
  Filled 2018-12-14: qty 5

## 2018-12-14 MED ORDER — FENTANYL CITRATE (PF) 100 MCG/2ML IJ SOLN
INTRAMUSCULAR | Status: DC | PRN
  Administered 2018-12-14: 50 ug via INTRAVENOUS

## 2018-12-14 MED ORDER — SODIUM CHLORIDE 0.9 % IV SOLN
INTRAVENOUS | Status: DC
  Administered 2018-12-14: 12:00:00 via INTRAVENOUS

## 2018-12-14 MED ORDER — FAMOTIDINE 20 MG PO TABS: 40.0000 mg | ORAL_TABLET | Freq: Once | ORAL | Status: DC | PRN

## 2018-12-14 MED ORDER — MIDAZOLAM HCL 2 MG/2ML IJ SOLN
INTRAMUSCULAR | Status: DC | PRN
  Administered 2018-12-14: 2 mg via INTRAVENOUS

## 2018-12-14 MED ORDER — MORPHINE SULFATE (PF) 4 MG/ML IV SOLN: 2.0000 mg | INTRAVENOUS | Status: DC | PRN

## 2018-12-14 MED ORDER — ONDANSETRON HCL 4 MG/2ML IJ SOLN: 4.0000 mg | Freq: Four times a day (QID) | INTRAMUSCULAR | Status: DC | PRN

## 2018-12-14 MED ORDER — FENTANYL CITRATE (PF) 100 MCG/2ML IJ SOLN
INTRAMUSCULAR | Status: AC
  Filled 2018-12-14: qty 2

## 2018-12-14 MED ORDER — HEPARIN SODIUM (PORCINE) 1000 UNIT/ML IJ SOLN
INTRAMUSCULAR | Status: AC
  Filled 2018-12-14: qty 1

## 2018-12-14 MED ORDER — DIPHENHYDRAMINE HCL 50 MG/ML IJ SOLN: 50.0000 mg | Freq: Once | INTRAMUSCULAR | Status: DC | PRN

## 2018-12-14 MED ORDER — HEPARIN SODIUM (PORCINE) 1000 UNIT/ML IJ SOLN
INTRAMUSCULAR | Status: DC | PRN
  Administered 2018-12-14: 5000 [IU] via INTRAVENOUS

## 2018-12-14 MED ORDER — IOHEXOL 300 MG/ML  SOLN
INTRAMUSCULAR | Status: DC | PRN
  Administered 2018-12-14: 60 mL via INTRAVENOUS

## 2018-12-14 MED ORDER — METHYLPREDNISOLONE SODIUM SUCC 125 MG IJ SOLR: 125.0000 mg | Freq: Once | INTRAMUSCULAR | Status: DC | PRN

## 2018-12-14 MED ORDER — HYDROMORPHONE HCL 1 MG/ML IJ SOLN: 1.0000 mg | Freq: Once | INTRAMUSCULAR | Status: DC | PRN

## 2018-12-14 MED ORDER — CEFAZOLIN SODIUM-DEXTROSE 2-4 GM/100ML-% IV SOLN
2.0000 g | Freq: Once | INTRAVENOUS | Status: AC
  Administered 2018-12-14: 11:00:00 1 g via INTRAVENOUS

## 2018-12-14 MED ORDER — LIDOCAINE HCL (PF) 1 % IJ SOLN
INTRAMUSCULAR | Status: AC
  Filled 2018-12-14: qty 30

## 2018-12-14 SURGICAL SUPPLY — 23 items
BALLN LUTONIX  018 4X60X130 (BALLOONS) ×2
BALLN LUTONIX 018 4X60X130 (BALLOONS) ×1
BALLN ULTRASCORE 5X150X130 (BALLOONS)
BALLN ULTRSCOR 014 2.5X150X150 (BALLOONS) ×3
BALLOON LUTONIX 018 4X60X130 (BALLOONS) ×1 IMPLANT
BALLOON ULTRASCORE 5X150X130 (BALLOONS) IMPLANT
BALLOON ULTRSC 014 2.5X150X150 (BALLOONS) ×1 IMPLANT
CATH PIG 70CM (CATHETERS) ×3 IMPLANT
CATH VERT 5FR 125CM (CATHETERS) ×3 IMPLANT
DEVICE PRESTO INFLATION (MISCELLANEOUS) ×3 IMPLANT
DEVICE STARCLOSE SE CLOSURE (Vascular Products) ×3 IMPLANT
DEVICE TORQUE .025-.038 (MISCELLANEOUS) ×3 IMPLANT
KIT FLOWMATE PROCEDURAL (KITS) IMPLANT
NEEDLE ENTRY 21GA 7CM ECHOTIP (NEEDLE) ×3 IMPLANT
PACK ANGIOGRAPHY (CUSTOM PROCEDURE TRAY) ×3 IMPLANT
SET INTRO CAPELLA COAXIAL (SET/KITS/TRAYS/PACK) ×3 IMPLANT
SHEATH BRITE TIP 5FRX11 (SHEATH) ×3 IMPLANT
SHEATH RAABE 6FR (SHEATH) ×3 IMPLANT
SYR MEDRAD MARK 7 150ML (SYRINGE) ×3 IMPLANT
TUBING CONTRAST HIGH PRESS 72 (TUBING) ×3 IMPLANT
WIRE AQUATRACK .035X260CM (WIRE) ×3 IMPLANT
WIRE J 3MM .035X145CM (WIRE) ×3 IMPLANT
WIRE SPARTACORE .014X300CM (WIRE) ×3 IMPLANT

## 2018-12-14 NOTE — H&P (Signed)
[facility] History & Physical Update  The patient was interviewed and re-examined.  The patient's previous History and Physical has been reviewed and is unchanged.  There is no change in the plan of care. We plan to proceed with the scheduled procedure.  Hortencia Pilar, MD  12/14/2018, 10:22 AM

## 2018-12-14 NOTE — Op Note (Signed)
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 Introduction catheter into left lower extremity 3rd order catheter placement  2. Contrast injection left lower extremity for distal runoff  3. Percutaneous transluminal angioplasty to 4 mm with Lutonix drug-eluting balloon left popliteal artery 4. Percutaneous transluminal angioplasty left peroneal to 2.5 mm  5. Star close closure right common femoral arteriotomy  Anesthesia: Conscious sedation was administered under my direct supervision by the interventional radiology RN. IV Versed plus fentanyl were utilized. Continuous ECG, pulse oximetry and blood pressure was monitored throughout the entire procedure.  Conscious sedation was for a total of 1 hour 17 minutes.  Sheath: 6 French Raby right common femoral retrograde  Contrast: 60 cc  Fluoroscopy Time: 6.6 minutes  Indications: [name] presents with increasing pain of the left lower extremity.  She has developed a left heel ulcer with gangrenous skin changes.  This suggests the patient is having limb threatening ischemia. The risks and benefits are reviewed all questions answered patient agrees to proceed.  Procedure:[name] is a 85 y.o. y.o. female who was identified and appropriate procedural time out was performed. The patient was then placed supine on the table and prepped and draped in the usual sterile fashion.   Ultrasound was placed in the sterile sleeve and the right groin was evaluated the right common femoral artery was echolucent and pulsatile indicating patency. Image was recorded for the permanent record  and under real-time visualization a microneedle was inserted into the common femoral artery followed by the microwire and then the micro-sheath. A J-wire was then advanced through the micro-sheath and a 5 [place] sheath was then inserted over a J-wire. J-wire was then advanced and a 5 French pigtail catheter was positioned at the level of the aortic bifurcation.  A RAO view of the pelvis was obtained. Subsequently a pigtail catheter with the stiff angle Glidewire was used to cross the aortic bifurcation the catheter wire were advanced down into the left distal external iliac artery. Oblique view of the femoral bifurcation was then obtained and subsequently the wire was reintroduced and the pigtail catheter negotiated into the SFA representing third order catheter placement. Distal runoff was then performed.  5000 units of heparin was then given and allowed to circulate and a 6 [place] Raby sheath was advanced up and over the bifurcation and positioned in the femoral artery  Straight catheter and stiff angle Glidewire were then negotiated down into the distal popliteal. Catheter was then advanced. Hand injection contrast demonstrated the tibial anatomy in detail.  Using an aqua track wire with a Kumpe catheter the peroneal occlusion was crossed.  Intraluminal positioning was verified by hand-injection through the Kumpe.  Aqua track wire was replaced by a 0.014 [place] core wire.  A 2.5 mm x 15 cm ultra score balloon was used to angioplasty the peroneal.  The inflation was for 2 minutes at 12 atm. Follow-up imaging demonstrated excellent patency with preservation of the distal runoff.  The detector was then repositioned and the distal popliteal was reimaged greater than 70% stenosis is noted in the below-knee segment of the popliteal artery and after appropriate measurements are made a 4 mm x 60 mm Lutonix balloon was used to angioplasty the popliteal artery. Inflations were to 12 atmospheres for 2 minutes.  Follow-up imaging demonstrated less than 5% residual stenosis with excellent result. Distal runoff was then reassessed and noted to be widely patent.   After review of these images the sheath is pulled into the right external iliac oblique of the common femoral is obtained and a Star close device deployed. There no immediate Complications.  Findings:  The left common femoral is widely patent as is the profunda femoris.  The distal popliteal demonstrates increasing disease with a 70% stenosis in the below-knee portion and the trifurcation is heavily diseased with occlusion of all 3 tibial vessels.  Anterior tibial and posterior tibial do not reconstitute however the peroneal reconstitutes in its midportion and is patent down to the foot.  Following angioplasty to 2.5 mm the peroneal now is in-line flow and looks quite nice and there is less than 5% residual stenosis. Angioplasty of the popliteal yields an excellent result with less than 5% residual stenosis  Summary: Successful recanalization left lower extremity for limb salvage   Disposition: Patient was taken to the recovery room in stable condition having tolerated the procedure well.  [name] 12/14/2018,12:41 PM

## 2018-12-14 NOTE — Discharge Instructions (Signed)
Angiogram, Care After °This sheet gives you information about how to care for yourself after your procedure. Your doctor may also give you more specific instructions. If you have problems or questions, contact your doctor. °Follow these instructions at home: °Insertion site care °· Follow instructions from your doctor about how to take care of your long, thin tube (catheter) insertion area. Make sure you: °? Wash your hands with soap and water before you change your bandage (dressing). If you cannot use soap and water, use hand sanitizer. °? Change your bandage as told by your doctor. °? Leave stitches (sutures), skin glue, or skin tape (adhesive) strips in place. They may need to stay in place for 2 weeks or longer. If tape strips get loose and curl up, you may trim the loose edges. Do not remove tape strips completely unless your doctor says it is okay. °· Do not take baths, swim, or use a hot tub until your doctor says it is okay. °· You may shower 24-48 hours after the procedure or as told by your doctor. °? Gently wash the area with plain soap and water. °? Pat the area dry with a clean towel. °? Do not rub the area. This may cause bleeding. °· Do not apply powder or lotion to the area. Keep the area clean and dry. °· Check your insertion area every day for signs of infection. Check for: °? More redness, swelling, or pain. °? Fluid or blood. °? Warmth. °? Pus or a bad smell. °Activity °· Rest as told by your doctor, usually for 1-2 days. °· Do not lift anything that is heavier than 10 lbs. (4.5 kg) or as told by your doctor. °· Do not drive for 24 hours if you were given a medicine to help you relax (sedative). °· Do not drive or use heavy machinery while taking prescription pain medicine. °General instructions ° °· Go back to your normal activities as told by your doctor, usually in about a week. Ask your doctor what activities are safe for you. °· If the insertion area starts to bleed, lie flat and put  pressure on the area. If the bleeding does not stop, get help right away. This is an emergency. °· Drink enough fluid to keep your pee (urine) clear or pale yellow. °· Take over-the-counter and prescription medicines only as told by your doctor. °· Keep all follow-up visits as told by your doctor. This is important. °Contact a doctor if: °· You have a fever. °· You have chills. °· You have more redness, swelling, or pain around your insertion area. °· You have fluid or blood coming from your insertion area. °· The insertion area feels warm to the touch. °· You have pus or a bad smell coming from your insertion area. °· You have more bruising around the insertion area. °· Blood collects in the tissue around the insertion area (hematoma) that may be painful to the touch. °Get help right away if: °· You have a lot of pain in the insertion area. °· The insertion area swells very fast. °· The insertion area is bleeding, and the bleeding does not stop after holding steady pressure on the area. °· The area near or just beyond the insertion area becomes pale, cool, tingly, or numb. °These symptoms may be an emergency. Do not wait to see if the symptoms will go away. Get medical help right away. Call your local emergency services (911 in the [place] Do not drive yourself to the hospital. °  Summary  After the procedure, it is common to have bruising and tenderness at the long, thin tube insertion area.  After the procedure, it is important to rest and drink plenty of fluids.  Do not take baths, swim, or use a hot tub until your doctor says it is okay to do so. You may shower 24-48 hours after the procedure or as told by your doctor.  If the insertion area starts to bleed, lie flat and put pressure on the area. If the bleeding does not stop, get help right away. This is an emergency. This information is not intended to replace advice given to you by your health care provider. Make sure you discuss any questions you have  with your health care provider. Document Released: 10/31/2008 Document Revised: 07/29/2016 Document Reviewed: 07/29/2016 Elsevier Interactive Patient Education  2019 [employer]. Moderate Conscious Sedation, Adult, Care After These instructions provide you with information about caring for yourself after your procedure. Your health care provider may also give you more specific instructions. Your treatment has been planned according to current medical practices, but problems sometimes occur. Call your health care provider if you have any problems or questions after your procedure. What can I expect after the procedure? After your procedure, it is common:  To feel sleepy for several hours.  To feel clumsy and have poor balance for several hours.  To have poor judgment for several hours.  To vomit if you eat too soon. Follow these instructions at home: For at least 24 hours after the procedure:   Do not: ? Participate in activities where you could fall or become injured. ? Drive. ? Use heavy machinery. ? Drink alcohol. ? Take sleeping pills or medicines that cause drowsiness. ? Make important decisions or sign legal documents. ? Take care of children on your own.  Rest. Eating and drinking  Follow the diet recommended by your health care provider.  If you vomit: ? Drink water, juice, or soup when you can drink without vomiting. ? Make sure you have little or no nausea before eating solid foods. General instructions  Have a responsible adult stay with you until you are awake and alert.  Take over-the-counter and prescription medicines only as told by your health care provider.  If you smoke, do not smoke without supervision.  Keep all follow-up visits as told by your health care provider. This is important. Contact a health care provider if:  You keep feeling nauseous or you keep vomiting.  You feel light-headed.  You develop a rash.  You have a fever. Get help  right away if:  You have trouble breathing. This information is not intended to replace advice given to you by your health care provider. Make sure you discuss any questions you have with your health care provider. Document Released: 05/25/2013 Document Revised: 01/07/2016 Document Reviewed: 11/24/2015 Elsevier Interactive Patient Education  2019 [employer].

## 2018-12-15 ENCOUNTER — Encounter: Payer: Self-pay | Admitting: Vascular Surgery

## 2018-12-17 LAB — GLUCOSE, CAPILLARY: Glucose-Capillary: 44 mg/dL — CL (ref 70–99)

## 2018-12-23 ENCOUNTER — Ambulatory Visit (INDEPENDENT_AMBULATORY_CARE_PROVIDER_SITE_OTHER): Payer: Medicare Other | Admitting: Vascular Surgery

## 2019-01-03 ENCOUNTER — Other Ambulatory Visit (INDEPENDENT_AMBULATORY_CARE_PROVIDER_SITE_OTHER): Payer: Self-pay | Admitting: Vascular Surgery

## 2019-01-03 DIAGNOSIS — I70249 Atherosclerosis of native arteries of left leg with ulceration of unspecified site: Secondary | ICD-10-CM

## 2019-01-03 DIAGNOSIS — Z9862 Peripheral vascular angioplasty status: Secondary | ICD-10-CM

## 2019-01-03 DIAGNOSIS — I70239 Atherosclerosis of native arteries of right leg with ulceration of unspecified site: Secondary | ICD-10-CM

## 2019-01-04 ENCOUNTER — Encounter (INDEPENDENT_AMBULATORY_CARE_PROVIDER_SITE_OTHER): Payer: Self-pay | Admitting: Nurse Practitioner

## 2019-01-04 ENCOUNTER — Other Ambulatory Visit: Payer: Self-pay

## 2019-01-04 ENCOUNTER — Ambulatory Visit (INDEPENDENT_AMBULATORY_CARE_PROVIDER_SITE_OTHER): Payer: Medicare Other | Admitting: Nurse Practitioner

## 2019-01-04 ENCOUNTER — Ambulatory Visit (INDEPENDENT_AMBULATORY_CARE_PROVIDER_SITE_OTHER): Payer: Medicare Other

## 2019-01-04 VITALS — BP 92/58 | HR 76 | Resp 15

## 2019-01-04 DIAGNOSIS — E782 Mixed hyperlipidemia: Secondary | ICD-10-CM

## 2019-01-04 DIAGNOSIS — L97929 Non-pressure chronic ulcer of unspecified part of left lower leg with unspecified severity: Secondary | ICD-10-CM | POA: Diagnosis not present

## 2019-01-04 DIAGNOSIS — I70239 Atherosclerosis of native arteries of right leg with ulceration of unspecified site: Secondary | ICD-10-CM

## 2019-01-04 DIAGNOSIS — L97519 Non-pressure chronic ulcer of other part of right foot with unspecified severity: Secondary | ICD-10-CM

## 2019-01-04 DIAGNOSIS — L97529 Non-pressure chronic ulcer of other part of left foot with unspecified severity: Secondary | ICD-10-CM | POA: Diagnosis not present

## 2019-01-04 DIAGNOSIS — I10 Essential (primary) hypertension: Secondary | ICD-10-CM

## 2019-01-04 DIAGNOSIS — Z79899 Other long term (current) drug therapy: Secondary | ICD-10-CM

## 2019-01-04 DIAGNOSIS — Z9862 Peripheral vascular angioplasty status: Secondary | ICD-10-CM

## 2019-01-04 DIAGNOSIS — L97919 Non-pressure chronic ulcer of unspecified part of right lower leg with unspecified severity: Secondary | ICD-10-CM

## 2019-01-04 DIAGNOSIS — I70235 Atherosclerosis of native arteries of right leg with ulceration of other part of foot: Secondary | ICD-10-CM | POA: Diagnosis not present

## 2019-01-04 DIAGNOSIS — I70245 Atherosclerosis of native arteries of left leg with ulceration of other part of foot: Secondary | ICD-10-CM

## 2019-01-04 DIAGNOSIS — E1122 Type 2 diabetes mellitus with diabetic chronic kidney disease: Secondary | ICD-10-CM

## 2019-01-04 DIAGNOSIS — I7025 Atherosclerosis of native arteries of other extremities with ulceration: Secondary | ICD-10-CM

## 2019-01-04 DIAGNOSIS — N183 Chronic kidney disease, stage 3 unspecified: Secondary | ICD-10-CM

## 2019-01-04 DIAGNOSIS — I129 Hypertensive chronic kidney disease with stage 1 through stage 4 chronic kidney disease, or unspecified chronic kidney disease: Secondary | ICD-10-CM

## 2019-01-04 DIAGNOSIS — I70249 Atherosclerosis of native arteries of left leg with ulceration of unspecified site: Secondary | ICD-10-CM | POA: Diagnosis not present

## 2019-01-10 ENCOUNTER — Encounter (INDEPENDENT_AMBULATORY_CARE_PROVIDER_SITE_OTHER): Payer: Self-pay | Admitting: Nurse Practitioner

## 2019-01-10 NOTE — Progress Notes (Signed)
SUBJECTIVE:  Patient ID: [name], female    DOB: 09/08/1927, 85 y.o.   MRN: [id number] Chief Complaint  Patient presents with  . Follow-up    [facility] 3week ABI    HPI  [name] is a 85 y.o. female that recently underwent revascularization due to ulcerations of her bilateral lower extremities.  Today the wounds are still present however they appear to be healing.  Today, the patient is a poor historian.  She has no nursing facility attendant with her.  She denies any pain of her lower extremities.  She denies any new wounds on her lower extremities.  Today the patient's ABIs were 1.15 bilaterally.  Her TBI on the right was 0.59 and her TBI on the left is 0.25.  Previous ABIs done on 12/02/2018 reveal an ABI of 0.58 on the right and 0.78 on the left.  The TBI on the left was 0.69 in the right was 0.36.  The patient has biphasic waveforms within the tibial arteries of the right lower extremity.  The left lower extremity has monophasic waveforms in the anterior tibial artery with nearly monophasic waveforms in the posterior tibial artery..  However her digit waveforms are nearly flat bilaterally.  Past Medical History:  Diagnosis Date  . Abnormal glucose   . Anxiety   . Arthritis   . Asthma   . Calculus of gallbladder without mention of cholecystitis, with obstruction   . Cancer ([place])    kidney  . Candidiasis of the esophagus   . Cataract cortical, senile   . CHF (congestive heart failure) ([place])   . Chronic kidney disease, stage III (moderate) (HCC)   . Chronic pain   . Chronic pain syndrome   . Corns and callosities   . Debility   . Depression   . Dermatophytosis of nail   . Diabetes mellitus without complication ([place])    unspecified, without mention of complication  . Difficult intubation   . Dysphagia   . Dyspnea    with exertion  . Edema, unspecified   . Emphysema lung ([place])   . Emphysema of lung ([place])   . Esophageal reflux   . Essential hypertension,  benign   . Generalized atherosclerosis   . Glaucoma (increased eye pressure)   . Gout, unspecified   . Hemiplegia ([place])   . Hereditary and idiopathic peripheral neuropathy   . Hyperglycemia   . Hyperlipidemia   . Hypertension   . Hypoglycemia, unspecified   . Hypopotassemia   . Hypothyroidism   . Migraine, unspecified, without mention of intractable migraine without mention of status migrainosus   . Other vitamin B12 deficiency anemia   . Pain in limb   . Peripheral vascular disease ([place])   . Personal history of fall   . Stroke ([place])   . Thyroid disease   . Ulcer of foot ([place])   . Unspecified asthma(493.90)   . Unspecified constipation   . Unspecified hypertensive heart disease with heart failure(402.91)   . Unspecified urinary incontinence   . Unspecified vitamin D deficiency     Past Surgical History:  Procedure Laterality Date  . ABDOMINAL HYSTERECTOMY    . arterial thrombi stents in both legs    . BACK SURGERY    . BACK SURGERY    . CATARACT EXTRACTION    . COLONOSCOPY    . ESOPHAGOGASTRODUODENOSCOPY (EGD) WITH PROPOFOL N/A 06/19/2016   Procedure: ESOPHAGOGASTRODUODENOSCOPY (EGD) WITH PROPOFOL;  Surgeon: Lollie Sails, MD;  Location[facility]  ENDOSCOPY;  Service: Endoscopy;  Laterality: N/A;  . ESOPHAGOGASTRODUODENOSCOPY (EGD) WITH PROPOFOL N/A 04/10/2017   Procedure: ESOPHAGOGASTRODUODENOSCOPY (EGD) WITH PROPOFOL;  Surgeon: Lollie Sails, MD;  Location: [facility] ENDOSCOPY;  Service: Endoscopy;  Laterality: N/A;  . EYE SURGERY     cataract extraction  . LOWER EXTREMITY ANGIOGRAPHY Right 10/07/2016   Procedure: Lower Extremity Angiography;  Surgeon: Katha Cabal, MD;  Location: [place] [facility];  Service: Cardiovascular;  Laterality: Right;  . LOWER EXTREMITY ANGIOGRAPHY Right 02/23/2018   Procedure: LOWER EXTREMITY ANGIOGRAPHY;  Surgeon: Katha Cabal, MD;  Location: [place] [facility];  Service: Cardiovascular;  Laterality: Right;  . LOWER EXTREMITY  ANGIOGRAPHY Left 05/18/2018   Procedure: LOWER EXTREMITY ANGIOGRAPHY;  Surgeon: Katha Cabal, MD;  Location: [place] [facility];  Service: Cardiovascular;  Laterality: Left;  . LOWER EXTREMITY ANGIOGRAPHY Right 12/10/2018   Procedure: Lower Extremity Angiography;  Surgeon: Katha Cabal, MD;  Location: [place] [facility];  Service: Cardiovascular;  Laterality: Right;  . LOWER EXTREMITY ANGIOGRAPHY Left 12/14/2018   Procedure: LOWER EXTREMITY ANGIOGRAPHY;  Surgeon: Katha Cabal, MD;  Location: [place] [facility];  Service: Cardiovascular;  Laterality: Left;  . PERIPHERAL VASCULAR CATHETERIZATION Left 09/02/2016   Procedure: Lower Extremity Angiography;  Surgeon: Katha Cabal, MD;  Location: [place] [facility];  Service: Cardiovascular;  Laterality: Left;  . PERIPHERAL VASCULAR CATHETERIZATION Right 09/16/2016   Procedure: Lower Extremity Angiography;  Surgeon: Katha Cabal, MD;  Location: [place] [facility];  Service: Cardiovascular;  Laterality: Right;    Social History   Socioeconomic History  . Marital status: Single    Spouse name: Not on file  . Number of children: Not on file  . Years of education: Not on file  . Highest education level: Not on file  Occupational History  . Occupation: retired  Scientific laboratory technician  . Financial resource strain: Not on file  . Food insecurity:    Worry: Not on file    Inability: Not on file  . Transportation needs:    Medical: Not on file    Non-medical: Not on file  Tobacco Use  . Smoking status: Never Smoker  . Smokeless tobacco: Never Used  Substance and Sexual Activity  . Alcohol use: No  . Drug use: No  . Sexual activity: Not Currently  Lifestyle  . Physical activity:    Days per week: Not on file    Minutes per session: Not on file  . Stress: Not on file  Relationships  . Social connections:    Talks on phone: Not on file    Gets together: Not on file    Attends religious service: Not on file    Active  member of club or organization: Not on file    Attends meetings of clubs or organizations: Not on file    Relationship status: Not on file  . Intimate partner violence:    Fear of current or ex partner: Not on file    Emotionally abused: Not on file    Physically abused: Not on file    Forced sexual activity: Not on file  Other Topics Concern  . Not on file  Social History Narrative  . Not on file    Family History  Problem Relation Age of Onset  . Cancer Father   . Stroke Father   . Seizures Mother   . Lung cancer Sister   . Bone cancer Sister     No Known  Allergies   Review of Systems   Review of Systems: Negative Unless Checked Constitutional: [] Weight loss  [] Fever  [] Chills Cardiac: [] Chest pain   []  Atrial Fibrillation  [] Palpitations   [] Shortness of breath when laying flat   [] Shortness of breath with exertion. [] Shortness of breath at rest Vascular:  [] Pain in legs with walking   [] Pain in legs with standing [] Pain in legs when laying flat   [] Claudication    [] Pain in feet when laying flat    [] History of DVT   [] Phlebitis   [x] Swelling in legs   [] Varicose veins   [x] Non-healing ulcers Pulmonary:   [x] Uses home oxygen   [] Productive cough   [] Hemoptysis   [] Wheeze  [x] COPD   [] Asthma Neurologic:  [] Dizziness   [] Seizures  [] Blackouts [x] History of stroke   [] History of TIA  [] Aphasia   [] Temporary Blindness   [] Weakness or numbness in arm   [] Weakness or numbness in leg Musculoskeletal:   [] Joint swelling   [] Joint pain   [] Low back pain  []  History of Knee Replacement [] Arthritis [] back Surgeries  []  Spinal Stenosis    Hematologic:  [] Easy bruising  [] Easy bleeding   [] Hypercoagulable state   [] Anemic Gastrointestinal:  [] Diarrhea   [] Vomiting  [x] Gastroesophageal reflux/heartburn   [] Difficulty swallowing. [] Abdominal pain Genitourinary:  [] Chronic kidney disease   [] Difficult urination  [] Anuric   [] Blood in urine [] Frequent urination  [] Burning with urination    [] Hematuria Skin:  [] Rashes   [] Ulcers [] Wounds Psychological:  [] History of anxiety   []  History of major depression  []  Memory Difficulties      OBJECTIVE:   Physical Exam  BP (!) 92/58 (BP Location: Right Arm)   Pulse 76   Resp 15   LMP  (LMP Unknown)   Gen: WD/WN, NAD Head: [place]/AT, No temporalis wasting.  Ear/Nose/Throat: Hearing grossly intact, nares w/o erythema or drainage Eyes: PER, EOMI, sclera nonicteric.  Neck: Supple, no masses.  No JVD.  Pulmonary:  Good air movement, no use of accessory muscles. Using Home Oxygen Cardiac: RRR Vascular: 2+ pitting edema bilaterally, wounds on bilateral toes  Vessel Right Left  Radial Palpable Palpable  Dorsalis Pedis Not Palpable Not Palpable  Posterior Tibial Not Palpable Not Palpable   Gastrointestinal: soft, non-distended. No guarding/no peritoneal signs.  Musculoskeletal: Wheelchair bound.  No deformity or atrophy.  Neurologic: Pain and light touch intact in extremities.  Symmetrical.  Speech is fluent. Motor exam as listed above. Psychiatric: Judgment intact, Mood & affect appropriate for pt's clinical situation. Dermatologic: No Venous rashes..  No changes consistent with cellulitis. Lymph : No Cervical lymphadenopathy, no lichenification or skin changes of chronic lymphedema.       ASSESSMENT AND PLAN:  1. Atherosclerosis of native arteries of the extremities with ulceration ([place]) Despite improved ABIs the patient's toe waveforms look worse and her wound not completely healed.  We will bring the patient back in 6 weeks to repeat ABIs as well as to assess the status of her wounds to determine if further revascularization is necessary or if worsening of the wounds has occurred.  2. Mixed hyperlipidemia Continue statin as ordered and reviewed, no changes at this time   3. Type 2 diabetes mellitus with stage 3 chronic kidney disease, without long-term current use of insulin (HCC) Continue hypoglycemic medications as  already ordered, these medications have been reviewed and there are no changes at this time.  Hgb A1C to be monitored as already arranged by primary service   4. Essential hypertension  Continue antihypertensive medications as already ordered, these medications have been reviewed and there are no changes at this time. Continue antihypertensive medications as already ordered, these medications have been reviewed and there are no changes at this time.    Current Outpatient Medications on File Prior to Visit  Medication Sig Dispense Refill  . acetaminophen (TYLENOL) 325 MG tablet Take 650 mg by mouth every 4 (four) hours as needed for mild pain.     Marland Kitchen allopurinol (ZYLOPRIM) 100 MG tablet Take 100 mg by mouth daily.    . Amino Acids-Protein Hydrolys (FEEDING SUPPLEMENT, PRO-STAT SUGAR FREE 64,) LIQD Take 30 mLs by mouth at bedtime.    Marland Kitchen amitriptyline (ELAVIL) 25 MG tablet Take 12.5 mg by mouth at bedtime.     Marland Kitchen aspirin EC 81 MG EC tablet Take 1 tablet (81 mg total) by mouth daily. 30 tablet 0  . atorvastatin (LIPITOR) 20 MG tablet Take 20 mg by mouth daily.    . Cholecalciferol (VITAMIN D-3) 1000 UNITS CAPS Take 2,000 Units by mouth daily.     . clopidogrel (PLAVIX) 75 MG tablet Take 1 tablet (75 mg total) by mouth daily. 30 tablet 4  . DULoxetine (CYMBALTA) 30 MG capsule Take 30 mg by mouth every morning.    . furosemide (LASIX) 40 MG tablet Take 40 mg by mouth daily.    Marland Kitchen gabapentin (NEURONTIN) 400 MG capsule Take 400 mg by mouth 2 (two) times daily.     Marland Kitchen glucose 4 GM chewable tablet Chew 1 tablet (4 g total) by mouth 3 (three) times daily. 50 tablet 12  . latanoprost (XALATAN) 0.005 % ophthalmic solution Place 1 drop into both eyes at bedtime.    Marland Kitchen levothyroxine (SYNTHROID, LEVOTHROID) 25 MCG tablet Take 25 mcg by mouth daily before breakfast.    . metoprolol tartrate (LOPRESSOR) 100 MG tablet Take 200 mg by mouth 2 (two) times daily.     . pantoprazole (PROTONIX) 40 MG tablet Take 40 mg  by mouth daily.     . potassium chloride (K-DUR,KLOR-CON) 10 MEQ tablet Take 10 mEq by mouth daily.    Marland Kitchen senna (SENOKOT) 8.6 MG tablet Take 2 tablets by mouth 2 (two) times daily.     Marland Kitchen topiramate (TOPAMAX) 25 MG tablet Take 25 mg by mouth at bedtime.    . traMADol (ULTRAM) 50 MG tablet Take 1 tablet (50 mg total) by mouth every 8 (eight) hours as needed (for pain control). (Patient taking differently: Take 50 mg by mouth every 6 (six) hours as needed (for pain control). ) 20 tablet 0   No current facility-administered medications on file prior to visit.     There are no Patient Instructions on file for this visit. Return in about 6 weeks (around 02/15/2019) for PAD.   Kris Hartmann, NP  This note was completed with Sales executive.  Any errors are purely unintentional.

## 2019-02-16 ENCOUNTER — Other Ambulatory Visit (INDEPENDENT_AMBULATORY_CARE_PROVIDER_SITE_OTHER): Payer: Self-pay | Admitting: Nurse Practitioner

## 2019-02-16 DIAGNOSIS — Z9862 Peripheral vascular angioplasty status: Secondary | ICD-10-CM

## 2019-02-16 DIAGNOSIS — I7025 Atherosclerosis of native arteries of other extremities with ulceration: Secondary | ICD-10-CM

## 2019-02-17 ENCOUNTER — Encounter (INDEPENDENT_AMBULATORY_CARE_PROVIDER_SITE_OTHER): Payer: Medicare Other

## 2019-02-17 ENCOUNTER — Ambulatory Visit (INDEPENDENT_AMBULATORY_CARE_PROVIDER_SITE_OTHER): Payer: Medicare Other | Admitting: Vascular Surgery

## 2019-03-19 DEATH — deceased

## 2019-08-12 IMAGING — US US CAROTID DUPLEX BILAT
1 series · 13 of 24 positions shown · non-contrast
Comparison: No prior duplex

CLINICAL DATA: 89-year-old female with a history of cerebrovascular
accident.

Cardiovascular risk factors include hypertension, known prior
stroke/ TIA, hyperlipidemia, diabetes, known vascular disease
EXAM:
BILATERAL CAROTID DUPLEX ULTRASOUND
TECHNIQUE: Gray scale imaging, color Doppler and duplex ultrasound were
performed of bilateral carotid and vertebral arteries in the neck.

[Series 1: us carotid duplex bilat · 0.06mm/px · 13 of 71 slices shown]
[im 1/71]
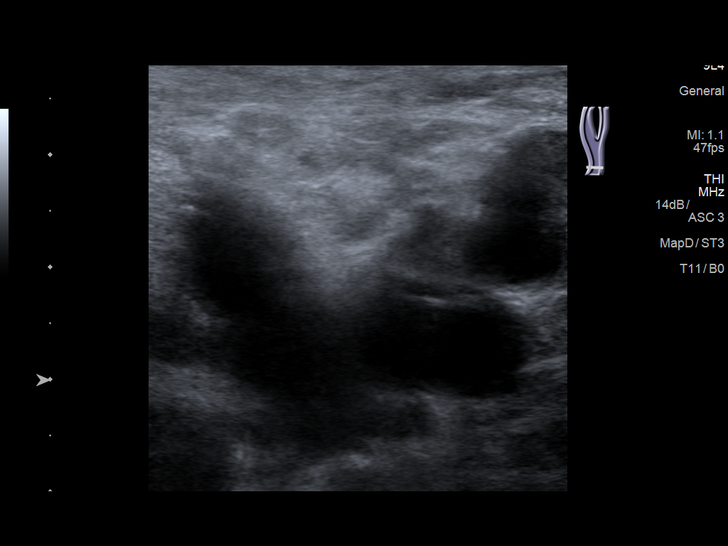
[im 7/71]
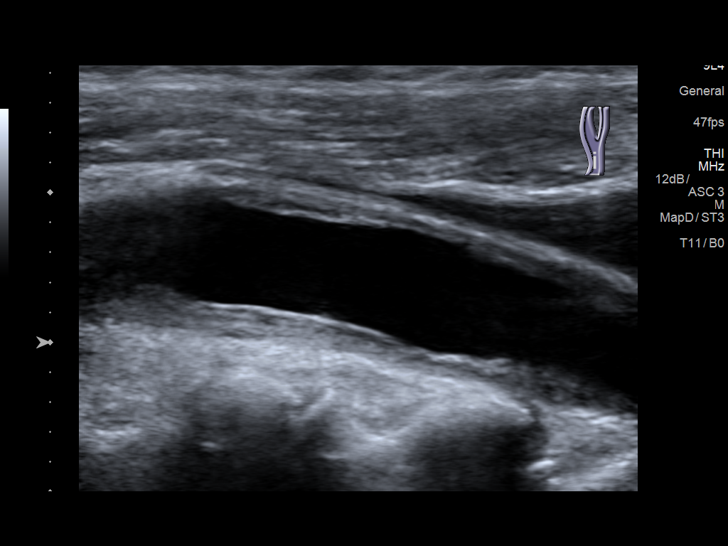
[im 13/71]
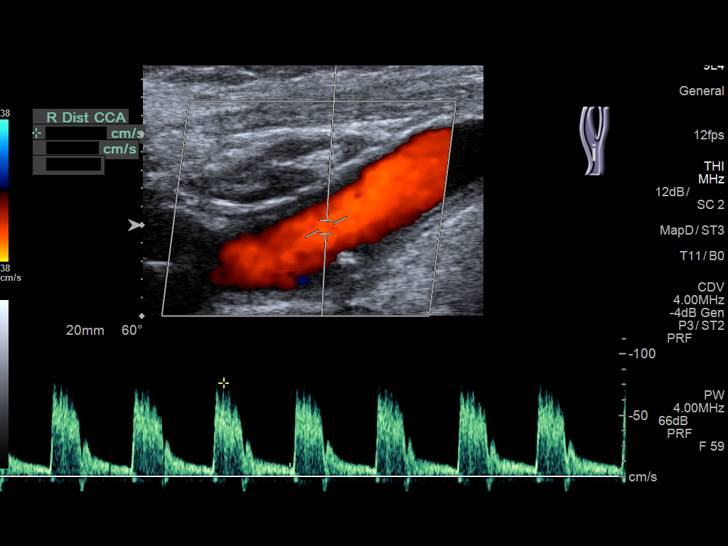
[im 19/71]
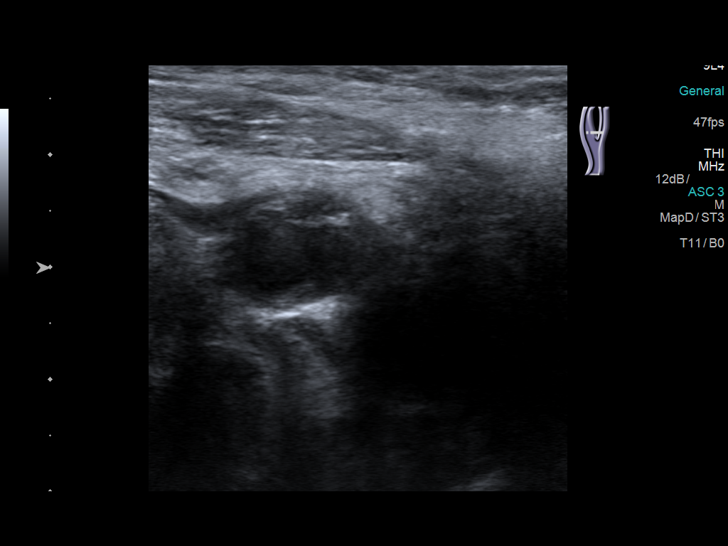
[im 25/71]
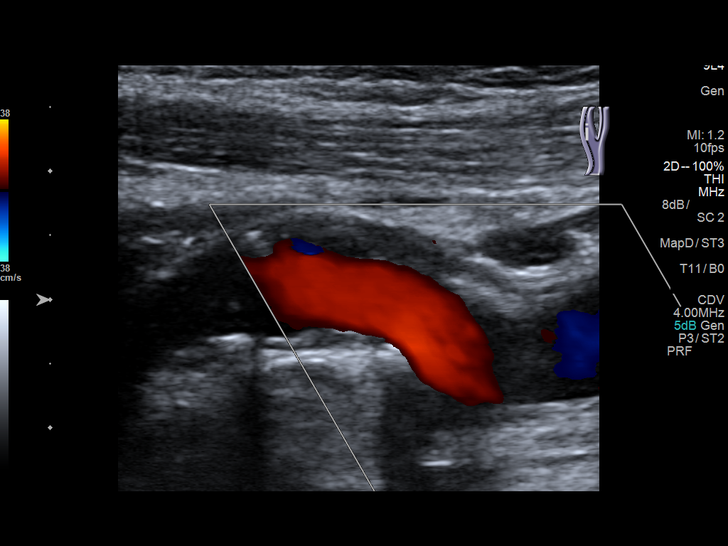
[im 31/71]
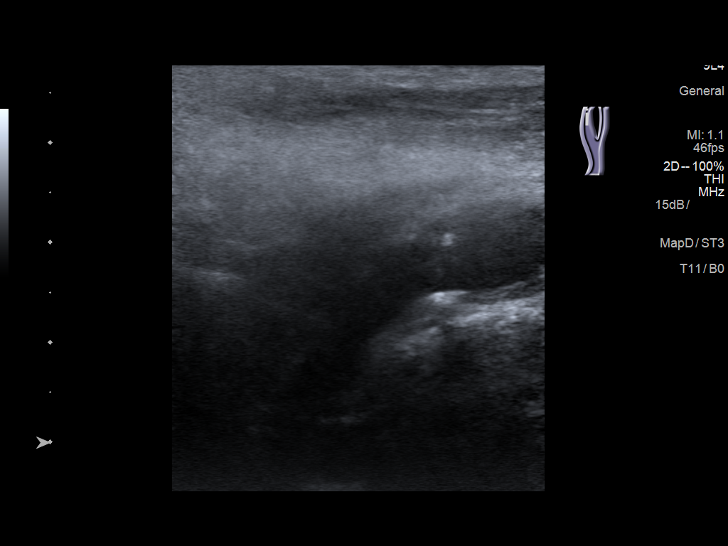
[im 37/71]
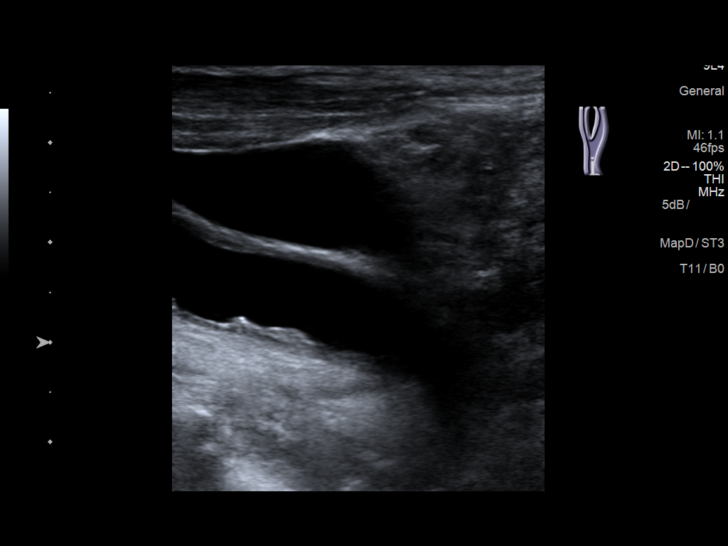
[im 40/71]
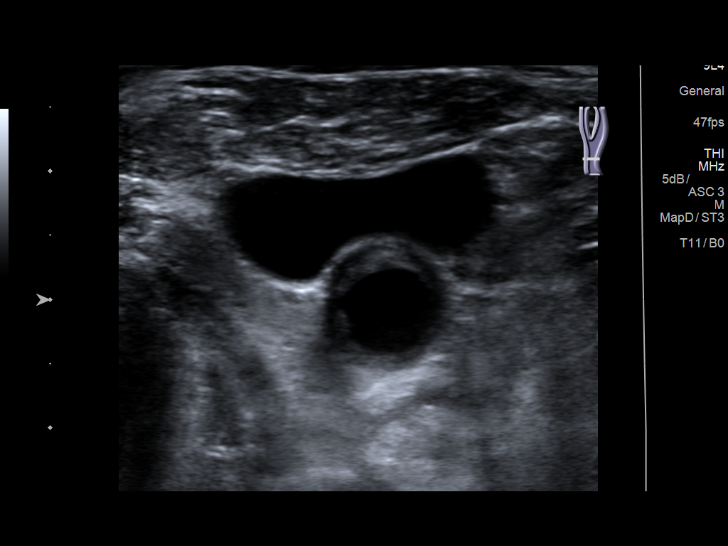
[im 46/71]
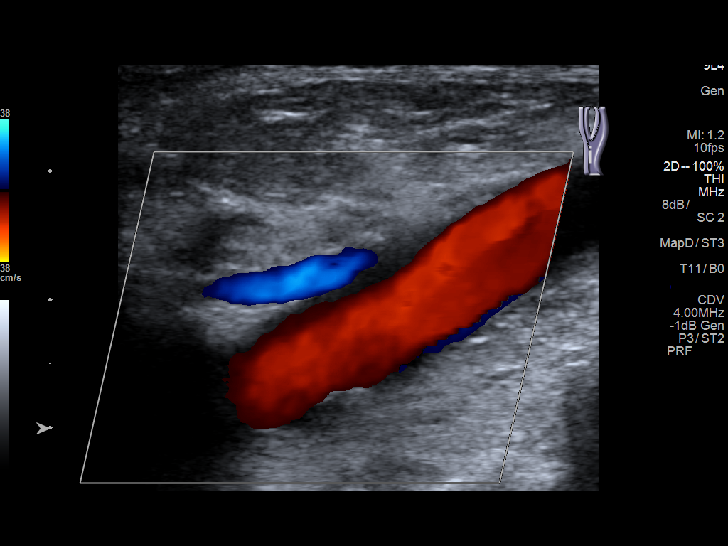
[im 52/71]
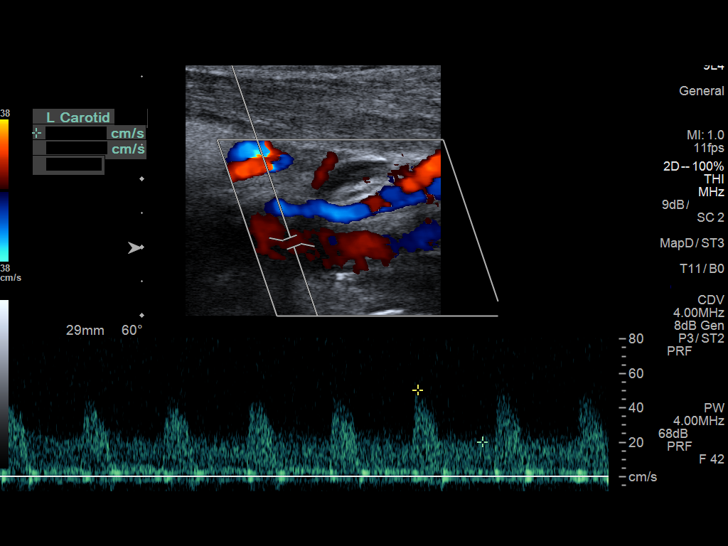
[im 58/71]
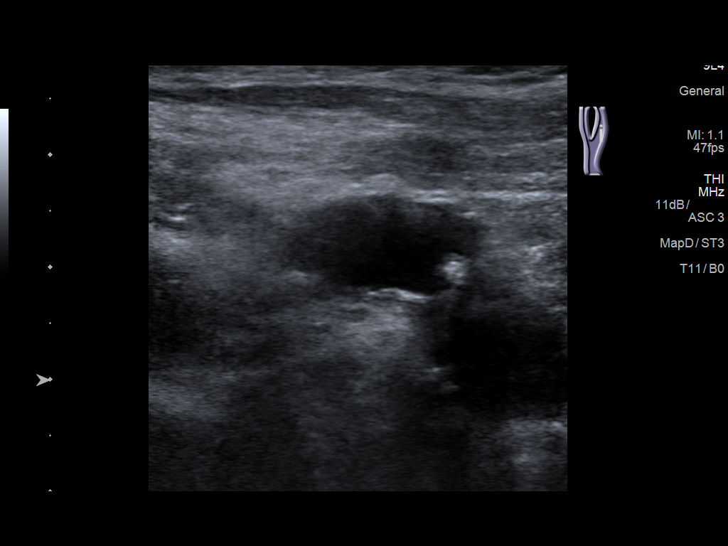
[im 64/71]
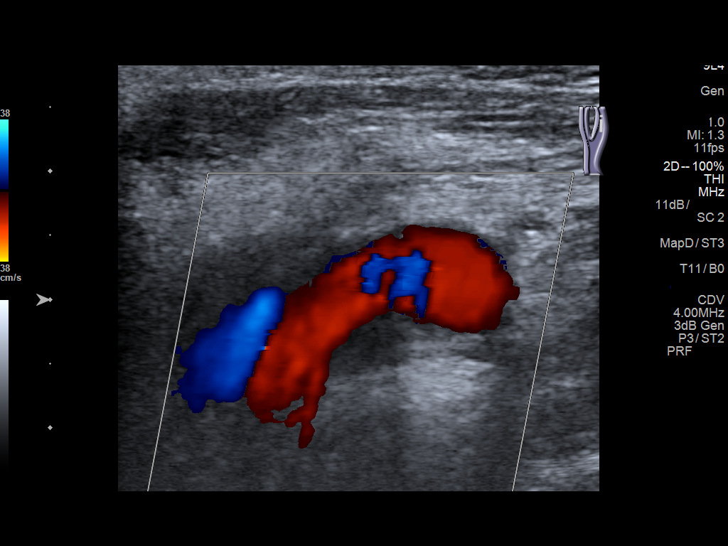
[im 71/71]
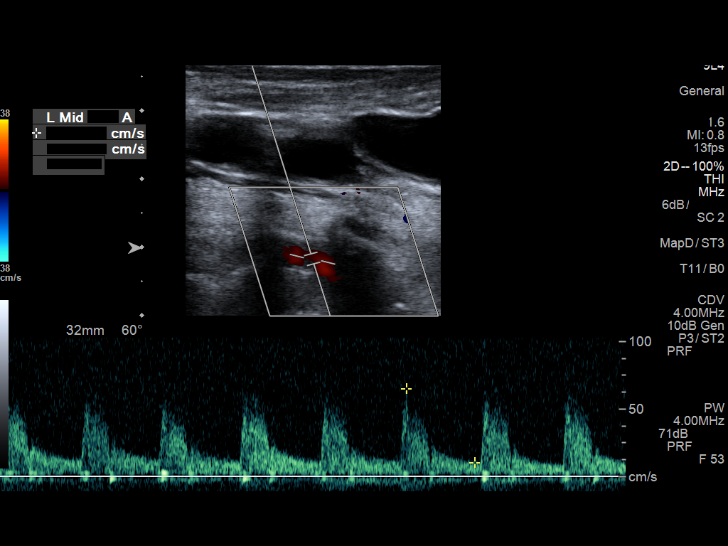

[13 of 24 positions shown; findings below may reference images not displayed]

FINDINGS: Criteria: Quantification of carotid stenosis is based on velocity
parameters that correlate the residual internal carotid diameter
with NASCET-based stenosis levels, using the diameter of the distal
internal carotid lumen as the denominator for stenosis measurement.

The following velocity measurements were obtained:

RIGHT

ICA:  Systolic 94 cm/sec, Diastolic 13 cm/sec

CCA:  84 cm/sec

SYSTOLIC ICA/CCA RATIO:

ECA:  80 cm/sec

LEFT

ICA:  Systolic 166 cm/sec, Diastolic 35 cm/sec

CCA:  105 cm/sec

SYSTOLIC ICA/CCA RATIO:

ECA:  22 cm/sec

Right Brachial SBP: Not acquired

Left Brachial SBP: Not acquired

RIGHT CAROTID ARTERY: Mild calcifications of the right common
carotid artery. Intermediate waveform maintained. Heterogeneous and
partially calcified plaque at the right carotid bifurcation. No
significant lumen shadowing. Low resistance waveform of the right
ICA. Mild tortuosity

RIGHT VERTEBRAL ARTERY: Antegrade flow with low resistance waveform.

LEFT CAROTID ARTERY: No significant calcifications of the left
common carotid artery. Intermediate waveform maintained.
Heterogeneous and partially calcified plaque at the left carotid
bifurcation without significant lumen shadowing. Low resistance
waveform of the left ICA. Mild tortuosity

LEFT VERTEBRAL ARTERY:  Antegrade flow with low resistance waveform.
IMPRESSION: Right:

Color duplex indicates moderate heterogeneous and calcified plaque,
with no hemodynamically significant stenosis by duplex criteria in
the extracranial cerebrovascular circulation.

Left:

Moderate heterogeneous and partially calcified plaque at the carotid
bifurcation, with discordant results regarding degree of stenosis by
established duplex criteria. Peak velocity suggests 50%- 69%
stenosis, with the ICA/ CCA ratio suggesting a lesser degree of
stenosis. If establishing a more accurate degree of stenosis is
required, cerebral angiogram should be considered, or as a second
best test, CTA.

## 2020-03-23 IMAGING — DX PORTABLE CHEST - 1 VIEW
1 series · 1 of 1 positions shown · non-contrast
Comparison: October 09, 2018

CLINICAL DATA: Shortness of breath

EXAM:
PORTABLE CHEST 1 VIEW

[chest ap]
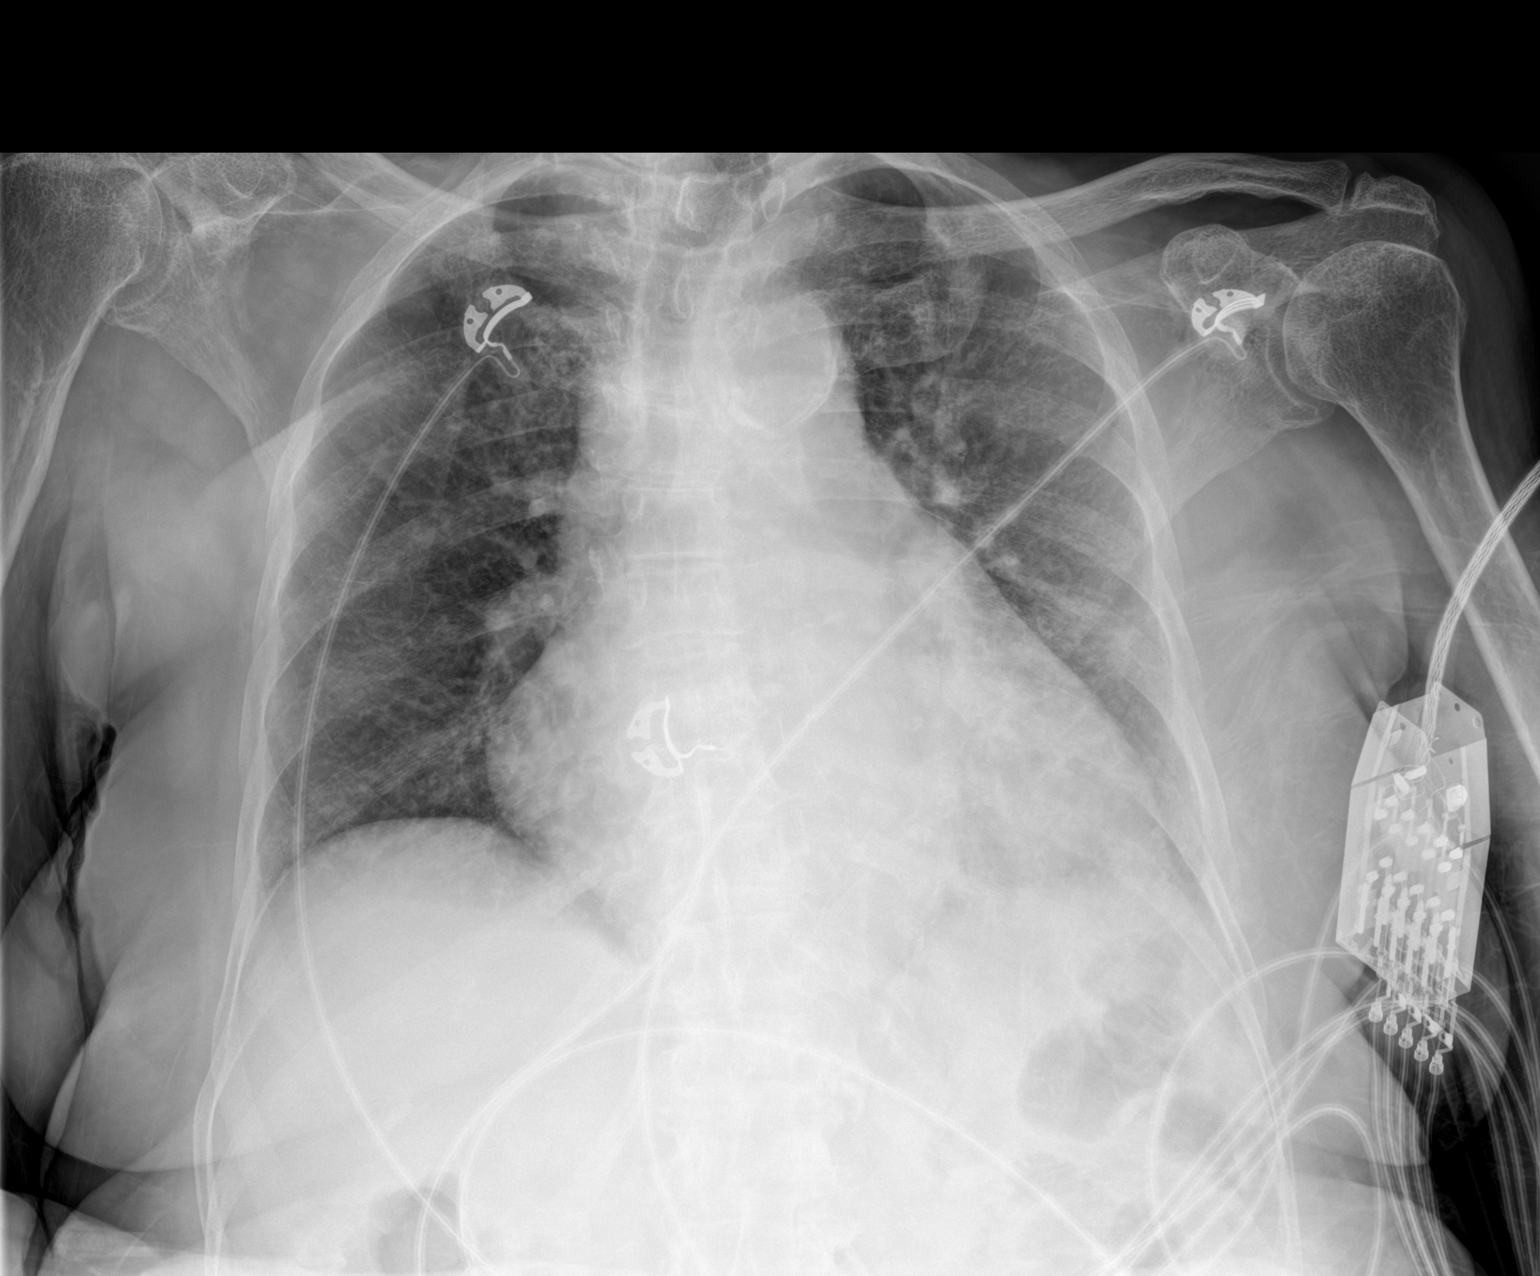

[1 of 1 positions shown; findings below may reference images not displayed]

FINDINGS: There is no appreciable edema or consolidation. There is
cardiomegaly with pulmonary vascularity within normal limits. No
adenopathy. There is aortic atherosclerosis. No bone lesions.
IMPRESSION: Stable cardiac enlargement. No edema or consolidation. Aortic
Atherosclerosis ([id number]).
